# Patient Record
Sex: Female | Born: 1950 | Race: White | Hispanic: No | Smoking: Former smoker
Health system: Southern US, Community
[De-identification: ages and names within clinical notes are randomized; demographics above are authoritative.]

## PROBLEM LIST (undated history)

## (undated) DIAGNOSIS — C3490 Malignant neoplasm of unspecified part of unspecified bronchus or lung: Secondary | ICD-10-CM

## (undated) DIAGNOSIS — M1990 Unspecified osteoarthritis, unspecified site: Secondary | ICD-10-CM

## (undated) DIAGNOSIS — Z947 Corneal transplant status: Secondary | ICD-10-CM

## (undated) DIAGNOSIS — T753XXA Motion sickness, initial encounter: Secondary | ICD-10-CM

## (undated) DIAGNOSIS — E785 Hyperlipidemia, unspecified: Secondary | ICD-10-CM

## (undated) DIAGNOSIS — H269 Unspecified cataract: Secondary | ICD-10-CM

## (undated) DIAGNOSIS — K219 Gastro-esophageal reflux disease without esophagitis: Secondary | ICD-10-CM

## (undated) DIAGNOSIS — G893 Neoplasm related pain (acute) (chronic): Secondary | ICD-10-CM

## (undated) DIAGNOSIS — T8859XA Other complications of anesthesia, initial encounter: Secondary | ICD-10-CM

## (undated) DIAGNOSIS — I10 Essential (primary) hypertension: Secondary | ICD-10-CM

## (undated) HISTORY — DX: Neoplasm related pain (acute) (chronic): G89.3

## (undated) HISTORY — DX: Corneal transplant status: Z94.7

## (undated) HISTORY — DX: Essential (primary) hypertension: I10

## (undated) HISTORY — DX: Hyperlipidemia, unspecified: E78.5

## (undated) HISTORY — PX: SKIN CANCER EXCISION: SHX779

## (undated) HISTORY — PX: BASAL CELL CARCINOMA EXCISION: SHX1214

## (undated) HISTORY — PX: CARPAL TUNNEL RELEASE: SHX101

## (undated) HISTORY — DX: Unspecified cataract: H26.9

---

## 1995-11-08 ENCOUNTER — Encounter: Payer: Self-pay | Admitting: Family Medicine

## 1995-11-08 LAB — CONVERTED CEMR LAB: Pap Smear: NORMAL

## 1996-11-29 ENCOUNTER — Encounter: Payer: Self-pay | Admitting: Family Medicine

## 1996-11-29 LAB — CONVERTED CEMR LAB
Blood Glucose, Fasting: 83 mg/dL
Pap Smear: NORMAL

## 1996-12-07 ENCOUNTER — Encounter: Payer: Self-pay | Admitting: Family Medicine

## 1996-12-07 LAB — CONVERTED CEMR LAB: TSH: 0.79 microintl units/mL

## 1997-12-24 ENCOUNTER — Encounter: Payer: Self-pay | Admitting: Family Medicine

## 1997-12-24 LAB — CONVERTED CEMR LAB: Pap Smear: NORMAL

## 2001-07-21 ENCOUNTER — Encounter: Payer: Self-pay | Admitting: Family Medicine

## 2001-07-21 LAB — CONVERTED CEMR LAB: Blood Glucose, Fasting: 103 mg/dL

## 2002-06-26 ENCOUNTER — Encounter: Payer: Self-pay | Admitting: Family Medicine

## 2002-06-26 LAB — CONVERTED CEMR LAB
Blood Glucose, Fasting: 107 mg/dL
TSH: 1.34 microintl units/mL

## 2003-07-23 ENCOUNTER — Encounter: Payer: Self-pay | Admitting: Family Medicine

## 2003-07-23 LAB — CONVERTED CEMR LAB
Blood Glucose, Fasting: 107 mg/dL
TSH: 1.138 microintl units/mL

## 2003-07-30 ENCOUNTER — Encounter: Payer: Self-pay | Admitting: Family Medicine

## 2003-07-30 LAB — CONVERTED CEMR LAB: Pap Smear: NORMAL

## 2003-12-14 ENCOUNTER — Other Ambulatory Visit: Payer: Self-pay

## 2004-07-02 ENCOUNTER — Encounter: Payer: Self-pay | Admitting: Family Medicine

## 2004-07-02 LAB — CONVERTED CEMR LAB: Blood Glucose, Fasting: 121 mg/dL

## 2004-07-08 ENCOUNTER — Ambulatory Visit: Payer: Self-pay | Admitting: Family Medicine

## 2004-07-20 ENCOUNTER — Ambulatory Visit: Payer: Self-pay | Admitting: Family Medicine

## 2004-07-27 ENCOUNTER — Ambulatory Visit: Payer: Self-pay | Admitting: Family Medicine

## 2004-08-13 ENCOUNTER — Ambulatory Visit: Payer: Self-pay | Admitting: Family Medicine

## 2004-09-11 ENCOUNTER — Ambulatory Visit: Payer: Self-pay | Admitting: Family Medicine

## 2004-11-12 ENCOUNTER — Ambulatory Visit: Payer: Self-pay | Admitting: Family Medicine

## 2004-12-11 ENCOUNTER — Encounter: Payer: Self-pay | Admitting: Family Medicine

## 2004-12-11 LAB — CONVERTED CEMR LAB: Hgb A1c MFr Bld: 5.5 %

## 2004-12-29 ENCOUNTER — Encounter: Payer: Self-pay | Admitting: Family Medicine

## 2004-12-29 LAB — CONVERTED CEMR LAB: Blood Glucose, Fasting: 108 mg/dL

## 2005-04-09 ENCOUNTER — Ambulatory Visit: Payer: Self-pay | Admitting: Family Medicine

## 2005-05-26 ENCOUNTER — Ambulatory Visit: Payer: Self-pay | Admitting: Family Medicine

## 2005-07-11 ENCOUNTER — Encounter: Payer: Self-pay | Admitting: Family Medicine

## 2005-07-11 LAB — CONVERTED CEMR LAB: Microalbumin U total vol: 3.5 mg/L

## 2005-07-15 ENCOUNTER — Encounter: Payer: Self-pay | Admitting: Family Medicine

## 2005-07-15 LAB — CONVERTED CEMR LAB
Blood Glucose, Fasting: 119 mg/dL
Hgb A1c MFr Bld: 5.7 %
TSH: 1.186 microintl units/mL

## 2005-07-22 ENCOUNTER — Ambulatory Visit: Payer: Self-pay | Admitting: Family Medicine

## 2005-08-04 ENCOUNTER — Ambulatory Visit: Payer: Self-pay | Admitting: Family Medicine

## 2005-09-15 ENCOUNTER — Ambulatory Visit: Payer: Self-pay | Admitting: Family Medicine

## 2005-11-04 ENCOUNTER — Ambulatory Visit: Payer: Self-pay | Admitting: Family Medicine

## 2005-11-18 ENCOUNTER — Ambulatory Visit: Payer: Self-pay | Admitting: Family Medicine

## 2006-04-13 ENCOUNTER — Ambulatory Visit: Payer: Self-pay | Admitting: Family Medicine

## 2006-07-28 ENCOUNTER — Encounter: Payer: Self-pay | Admitting: Family Medicine

## 2006-07-28 LAB — CONVERTED CEMR LAB
Blood Glucose, Fasting: 107 mg/dL
Hgb A1c MFr Bld: 5.9 %
TSH: 1.356 microintl units/mL

## 2006-08-03 ENCOUNTER — Ambulatory Visit: Payer: Self-pay | Admitting: Family Medicine

## 2006-08-11 LAB — CONVERTED CEMR LAB
OCCULT 1: NEGATIVE
OCCULT 2: NEGATIVE
OCCULT 3: NEGATIVE

## 2006-08-18 ENCOUNTER — Telehealth (INDEPENDENT_AMBULATORY_CARE_PROVIDER_SITE_OTHER): Payer: Self-pay | Admitting: *Deleted

## 2006-08-22 ENCOUNTER — Ambulatory Visit: Payer: Self-pay | Admitting: Family Medicine

## 2006-12-06 ENCOUNTER — Encounter: Payer: Self-pay | Admitting: Family Medicine

## 2006-12-06 DIAGNOSIS — M159 Polyosteoarthritis, unspecified: Secondary | ICD-10-CM | POA: Insufficient documentation

## 2006-12-06 DIAGNOSIS — G2589 Other specified extrapyramidal and movement disorders: Secondary | ICD-10-CM | POA: Insufficient documentation

## 2006-12-06 DIAGNOSIS — E785 Hyperlipidemia, unspecified: Secondary | ICD-10-CM | POA: Insufficient documentation

## 2006-12-06 DIAGNOSIS — C44300 Unspecified malignant neoplasm of skin of unspecified part of face: Secondary | ICD-10-CM | POA: Insufficient documentation

## 2006-12-06 DIAGNOSIS — K219 Gastro-esophageal reflux disease without esophagitis: Secondary | ICD-10-CM | POA: Insufficient documentation

## 2006-12-06 DIAGNOSIS — J309 Allergic rhinitis, unspecified: Secondary | ICD-10-CM | POA: Insufficient documentation

## 2006-12-06 DIAGNOSIS — C44309 Unspecified malignant neoplasm of skin of other parts of face: Secondary | ICD-10-CM | POA: Insufficient documentation

## 2006-12-08 ENCOUNTER — Ambulatory Visit: Payer: Self-pay | Admitting: Family Medicine

## 2006-12-09 ENCOUNTER — Encounter: Payer: Self-pay | Admitting: Family Medicine

## 2006-12-13 ENCOUNTER — Encounter (INDEPENDENT_AMBULATORY_CARE_PROVIDER_SITE_OTHER): Payer: Self-pay | Admitting: *Deleted

## 2007-01-18 ENCOUNTER — Telehealth (INDEPENDENT_AMBULATORY_CARE_PROVIDER_SITE_OTHER): Payer: Self-pay | Admitting: *Deleted

## 2007-04-10 ENCOUNTER — Telehealth: Payer: Self-pay | Admitting: Family Medicine

## 2007-06-29 ENCOUNTER — Telehealth: Payer: Self-pay | Admitting: Family Medicine

## 2007-06-29 ENCOUNTER — Encounter: Payer: Self-pay | Admitting: Family Medicine

## 2007-11-04 ENCOUNTER — Encounter: Payer: Self-pay | Admitting: Family Medicine

## 2007-11-07 ENCOUNTER — Ambulatory Visit: Payer: Self-pay | Admitting: Family Medicine

## 2007-11-11 ENCOUNTER — Encounter: Payer: Self-pay | Admitting: Family Medicine

## 2007-11-30 ENCOUNTER — Ambulatory Visit: Payer: Self-pay | Admitting: Family Medicine

## 2007-11-30 LAB — CONVERTED CEMR LAB
OCCULT 1: NEGATIVE
OCCULT 2: NEGATIVE
OCCULT 3: NEGATIVE

## 2007-12-01 ENCOUNTER — Encounter (INDEPENDENT_AMBULATORY_CARE_PROVIDER_SITE_OTHER): Payer: Self-pay | Admitting: *Deleted

## 2007-12-20 ENCOUNTER — Encounter: Payer: Self-pay | Admitting: Family Medicine

## 2007-12-20 ENCOUNTER — Ambulatory Visit: Payer: Self-pay | Admitting: Family Medicine

## 2008-01-02 ENCOUNTER — Ambulatory Visit: Payer: Self-pay | Admitting: Family Medicine

## 2008-01-02 ENCOUNTER — Encounter: Payer: Self-pay | Admitting: Family Medicine

## 2008-01-08 ENCOUNTER — Encounter (INDEPENDENT_AMBULATORY_CARE_PROVIDER_SITE_OTHER): Payer: Self-pay | Admitting: *Deleted

## 2008-04-10 ENCOUNTER — Telehealth: Payer: Self-pay | Admitting: Family Medicine

## 2008-04-15 ENCOUNTER — Telehealth: Payer: Self-pay | Admitting: Family Medicine

## 2008-11-01 ENCOUNTER — Telehealth: Payer: Self-pay | Admitting: Family Medicine

## 2008-11-05 ENCOUNTER — Encounter: Payer: Self-pay | Admitting: Family Medicine

## 2008-11-14 ENCOUNTER — Ambulatory Visit: Payer: Self-pay | Admitting: Family Medicine

## 2008-11-14 DIAGNOSIS — E119 Type 2 diabetes mellitus without complications: Secondary | ICD-10-CM | POA: Insufficient documentation

## 2008-11-21 ENCOUNTER — Encounter (INDEPENDENT_AMBULATORY_CARE_PROVIDER_SITE_OTHER): Payer: Self-pay | Admitting: *Deleted

## 2008-12-04 ENCOUNTER — Ambulatory Visit: Payer: Self-pay | Admitting: Family Medicine

## 2008-12-04 ENCOUNTER — Encounter (INDEPENDENT_AMBULATORY_CARE_PROVIDER_SITE_OTHER): Payer: Self-pay | Admitting: *Deleted

## 2008-12-04 LAB — CONVERTED CEMR LAB
OCCULT 1: NEGATIVE
OCCULT 2: NEGATIVE
OCCULT 3: NEGATIVE

## 2008-12-12 ENCOUNTER — Ambulatory Visit: Payer: Self-pay | Admitting: Family Medicine

## 2008-12-12 ENCOUNTER — Telehealth: Payer: Self-pay | Admitting: Family Medicine

## 2009-01-16 LAB — HM PAP SMEAR: HM Pap smear: NORMAL

## 2009-03-12 ENCOUNTER — Telehealth: Payer: Self-pay | Admitting: Family Medicine

## 2009-03-14 ENCOUNTER — Encounter: Payer: Self-pay | Admitting: Family Medicine

## 2009-03-20 ENCOUNTER — Ambulatory Visit: Payer: Self-pay | Admitting: Family Medicine

## 2009-03-20 DIAGNOSIS — M1990 Unspecified osteoarthritis, unspecified site: Secondary | ICD-10-CM | POA: Insufficient documentation

## 2009-05-09 ENCOUNTER — Telehealth: Payer: Self-pay | Admitting: Family Medicine

## 2009-05-16 ENCOUNTER — Encounter: Payer: Self-pay | Admitting: Family Medicine

## 2009-06-03 ENCOUNTER — Ambulatory Visit: Payer: Self-pay | Admitting: Orthopedic Surgery

## 2009-06-10 ENCOUNTER — Telehealth: Payer: Self-pay | Admitting: Family Medicine

## 2009-06-11 ENCOUNTER — Ambulatory Visit: Payer: Self-pay | Admitting: Orthopedic Surgery

## 2009-11-17 ENCOUNTER — Encounter (INDEPENDENT_AMBULATORY_CARE_PROVIDER_SITE_OTHER): Payer: Self-pay | Admitting: *Deleted

## 2010-01-12 LAB — HM MAMMOGRAPHY: HM Mammogram: NORMAL

## 2010-02-03 ENCOUNTER — Ambulatory Visit: Payer: Self-pay | Admitting: Internal Medicine

## 2010-05-12 NOTE — Progress Notes (Signed)
Summary: wants referral to hand specialist  Phone Note Call from Patient Call back at Home Phone [phone]   Caller: Patient Call For: Shaune Leeks MD Summary of Call: Pt requests referral to a hand specialist for her arthritis- wants to see Dr Kennith Center in [place].  Needs an early morning or late afternoon appt if possible. Initial call taken by: Lowella Petties CMA,  May 09, 2009 12:31 PM  Follow-up for Phone Call        Pt called to see if had appt with Dr. Kennith Center yet. I explained phone note in place and someone will call her back with info when have more information. Pt said that was OK. Lewanda Rife LPN  May 12, 2009 4:47 PM   Additional Follow-up for Phone Call Additional follow up Details #1::        Appt made with Dr Kennith Center on 05/15/2009 at 2:30pm patient notified by phone.  Additional Follow-up by: Carlton Adam,  May 13, 2009 4:21 PM

## 2010-05-12 NOTE — Letter (Signed)
Summary: Nadara Eaton letter  [place] at [facility]  [address] [place], [place] [address]   Phone: [phone]  Fax: [phone]       11/17/2009 MRN: [id number]  [facility] [address] [place], [place]  [address]  Dear Ms. [facility] [place], and [place] announce the retirement of Arta Silence, M.D., from full-time practice at the [facility] office effective October 09, 2009 and his plans of returning part-time.  It is important to Dr. Hetty Ely and to our practice that you understand that [facility] has seven physicians in our office for your health care needs.  We will continue to offer the same exceptional care that you have today.    Dr. Hetty Ely has spoken to many of you about his plans for retirement and returning part-time in the fall.   We will continue to work with you through the transition to schedule appointments for you in the office and meet the high standards that [place] is committed to.   Again, it is with great pleasure that we share the news that Dr. Hetty Ely will return to [facility] in October of 2011 with a reduced schedule.    If you have any questions, or would like to request an appointment with one of our physicians, please call us at [phone] and press the option for Scheduling an appointment.  We take pleasure in providing you with excellent patient care and look forward to seeing you at your next office visit.  Our [facility] Physicians are:  Tillman Abide, M.D. Laurita Quint, M.D. Roxy Manns, M.D. Kerby Nora, M.D. Hannah Beat, M.D. Ruthe Mannan, M.D. We proudly welcomed Raechel Ache, M.D. and Eustaquio Boyden, M.D. to the practice in July/August 2011.  Sincerely,  [facility]

## 2010-05-12 NOTE — Assessment & Plan Note (Signed)
Summary: 3 month follow up/rbh R/S FROM 12/7   Vital Signs:  Patient profile:   60 year old female Weight:      138.50 pounds Temp:     97.7 degrees F oral Pulse rate:   84 / minute Pulse rhythm:   regular BP sitting:   130 / 72  (left arm) Cuff size:   regular  Vitals Entered By: Sydell Axon LPN (March 20, 2009 3:54 PM) CC: 3 Month follow-up   History of Present Illness: Pt here for three month followup for new onset DM, first f/u last time with 5 pound weight loss. She hjas since that appt, quit smoking gone through Thanksgiving and still diabetic and has lost another 2 pounds!! Congrats on quitting smoking!! She feels well w/o complaint and is continuing to watch her diet carefully. SHe was slightly noncompliant  over the holiday but in general has done very well. Her nos are typically in the 90-110 range. Her only complaint is her hands tingling and feeling numb at times. She had been predcribed splints in the past and has worn them but not all the time she should. We discussed this again.  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 1 Daily By Mouth 2)  Simvastatin 40 Mg Tabs (Simvastatin) .... One Tab By Mouth At Night 3)  Prilosec 20 Mg  Cpdr (Omeprazole) .Marland Kitchen.. 1 Once Daily Prn 4)  Chantix Starting Month Pak 0.5 Mg X 11 & 1 Mg X 42 Tabs (Varenicline Tartrate) .... As Dir 5)  Chantix 1 Mg Tabs (Varenicline Tartrate) .... As Dir 6)   Onetouch Ultra Test  Strp (Glucose Blood) .... Check Blood Sugar One Time A Day  Allergies: No Known Drug Allergies  Physical Exam  General:  Well-developed,well-nourished,in no acute distress; alert,appropriate and cooperative throughout examination Head:  Normocephalic and atraumatic without obvious abnormalities. No apparent alopecia or balding. Eyes:  Conjunctiva clear bilaterally.  Ears:  External ear exam shows no significant lesions or deformities.  Otoscopic examination reveals clear canals, tympanic membranes are intact bilaterally without bulging, retraction, inflammation or discharge. Hearing is grossly normal bilaterally. Nose:  External nasal examination shows no deformity or inflammation. Nasal mucosa are pink and moist without lesions or exudates. Mouth:  Oral mucosa and oropharynx without lesions or exudates.  Teeth in good repair. Neck:  No deformities, masses, or tenderness noted. Chest Wall:  No deformities, masses, or tenderness noted. Lungs:  Normal respiratory effort, chest expands symmetrically. Lungs are clear to auscultation, no crackles or wheezes. Heart:  Normal rate and regular rhythm. S1 and S2 normal without gallop, murmur, click, rub or other extra sounds.   Impression & Recommendations:  Problem # 1:  DIABETES MELLITUS, WITHOUT COMPLICATIONS (ICD-250.00) Assessment Improved  Doing well. Discussed diet and exercise. Discussed progression of disease. She has good knowledge and knows what and how to eat appropriately. Requests not to come back until PE when I will be gone. Continue practicing practices we have discussed.  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 Let me know if dioesn't help. May try Requip or poss refer to Hand Surg for eval.  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 Discussed Tyl regularly.  Complete Medication List: 1)  Furosemide 40 Mg Tabs (Furosemide) .Marland Kitchen.. 1 daily by mouth 2)  Simvastatin 40 Mg Tabs (Simvastatin) .... One tab by mouth at night 3)  Prilosec 20 Mg Cpdr (Omeprazole) .Marland Kitchen.. 1 once daily prn 4)  Chantix Starting Month Pak 0.5 Mg X 11 & 1 Mg X 42 Tabs (Varenicline tartrate) .... As dir 5)  Chantix 1 Mg Tabs (Varenicline tartrate) .... As dir 6)  Onetouch Ultra Test Strp (Glucose blood) .... Check blood sugar one time a day  Patient Instructions: 1)  For congestion, Take Guaifenesin by [facility] or [facility] and getting MUCOUS RELIEF EXPECTORANT (400mg ), take 11/2 tabs by mouth AM and NOON. 2)  Drink lots of fluids anytime taking Guaifenesin. 3)  RTC as needed.  Current Allergies (reviewed today): No known allergies

## 2010-05-12 NOTE — Progress Notes (Signed)
Summary: asking for transderm scop  Phone Note Call from Patient   Caller: Patient Call For: Shaune Leeks MD Summary of Call: Pt is going on a 5 day cruise and is asking for transderm scop [facility] [place]. Initial call taken by: Lowella Petties,  April 10, 2008 9:45 AM    New/Updated Medications: TRANSDERM-SCOP 1.5 MG PT72 (SCOPOLAMINE BASE) apply on patch behind ear every 3 days   Prescriptions: TRANSDERM-SCOP 1.5 MG PT72 (SCOPOLAMINE BASE) apply on patch behind ear every 3 days  #2 patches x 0   Entered and Authorized by:   Shaune Leeks MD   Signed by:   Shaune Leeks MD on 04/10/2008   Method used:   [id number]       [address] [facility], [place]  [address]       Ph: [phone] or [phone]       Fax: [phone]   RxID:   [id number]

## 2010-05-12 NOTE — Assessment & Plan Note (Signed)
Summary: CPX/DLO   Vital Signs:  Patient Profile:   60 Years Old Female Height:     61. inches (156.21 cm) Weight:      142 pounds Temp:     97.9 degrees F oral Pulse rate:   64 / minute Pulse rhythm:   regular BP sitting:   140 / 60  (left arm) Cuff size:   regular  Vitals Entered By: Providence Crosby (November 07, 2007 8:23 AM)                 Chief Complaint:  check up// husband died in april// skin cancer removed from chin// hemoccult cards to patient.  History of Present Illness: Husband died in bed somewhat unexpectedly in April. Pt here for Comp Exam, doing ok. Pt dealing with death ok, no overt depression. No real complaints today. Periodic Limb Disorder is stable.    Prior Medications Reviewed Using: Patient Recall  Current Allergies: No known allergies   Past Surgical History:    TONSILLECTOMY    NSVD x 2    MISCARRIAGE    SEPTOPLASTY W/ SINUS REPAIR    HOSPITAL PNEUMONIA:(12/06- 03/20/2001    BCC removal  left lower jaw(Dr Jarold Motto) (11/02/07)   Social History:    Marital Status: Married  Widow  HUSBAND dec 4/09    Children: 2 GIRLS BOTH AT HOME; [facility] AND HEATHER    Occupation:[employer]    Risk Factors:  Tobacco use:  current    Year started:  recurrent    Counseled to quit/cut down tobacco use:  yes Passive smoke exposure:  no Drug use:  no HIV high-risk behavior:  no Caffeine use:  2 drinks per day Alcohol use:  no Exercise:  yes    Times per week:  6    Type:  walks  Seatbelt use:  100 %   Review of Systems  Eyes      Complains of blurring.      Denies discharge, double vision, eye irritation, eye pain, halos, itching, light sensitivity, red eye, vision loss-1 eye, and vision loss-both eyes.      to see Opth next week  ENT      Denies decreased hearing, difficulty swallowing, ear discharge, earache, hoarseness, nasal congestion, nosebleeds, postnasal drainage, ringing in ears, sinus pressure, and sore throat.  CV  Denies bluish discoloration of lips or nails, chest pain or discomfort, difficulty breathing at night, difficulty breathing while lying down, fainting, fatigue, leg cramps with exertion, lightheadness, near fainting, palpitations, shortness of breath with exertion, swelling of feet, swelling of hands, and weight gain.  Resp      Denies chest discomfort, chest pain with inspiration, cough, coughing up blood, excessive snoring, hypersomnolence, morning headaches, pleuritic, shortness of breath, sputum productive, and wheezing.  GI      Complains of constipation and indigestion.      Denies abdominal pain, bloody stools, change in bowel habits, dark tarry stools, diarrhea, excessive appetite, gas, hemorrhoids, loss of appetite, nausea, vomiting, vomiting blood, and yellowish skin color.      occas  GU      Denies abnormal vaginal bleeding, decreased libido, discharge, dysuria, genital sores, hematuria, incontinence, nocturia, urinary frequency, and urinary hesitancy.  MS      Complains of joint pain.      Denies joint redness, joint swelling, loss of strength, low back pain, mid back pain, muscle aches, muscle , cramps, muscle weakness, stiffness, and thoracic pain.      various  of upper extremity.  Derm      Denies changes in color of skin, changes in nail beds, dryness, excessive perspiration, flushing, hair loss, insect bite(s), itching, lesion(s), poor wound healing, and rash.      L ankle Rhus Derm  Neuro      Denies brief paralysis, difficulty with concentration, disturbances in coordination, falling down, headaches, inability to speak, memory loss, numbness, poor balance, seizures, sensation of room spinning, tingling, tremors, visual disturbances, and weakness.   Physical Exam  General:     Well-developed,well-nourished,in no acute distress; alert,appropriate and cooperative throughout examination Head:     Normocephalic and atraumatic without obvious abnormalities. No apparent  alopecia or balding. Eyes:     Conjunctiva clear bilaterally.  Ears:     External ear exam shows no significant lesions or deformities.  Otoscopic examination reveals clear canals, tympanic membranes are intact bilaterally without bulging, retraction, inflammation or discharge. Hearing is grossly normal bilaterally. Nose:     External nasal examination shows no deformity or inflammation. Nasal mucosa are pink and moist without lesions or exudates. Mouth:     Oral mucosa and oropharynx without lesions or exudates.  Teeth in good repair. Neck:     No deformities, masses, or tenderness noted. Chest Wall:     No deformities, masses, or tenderness noted. Breasts:     No mass, nodules, thickening, tenderness, bulging, retraction, inflamation, nipple discharge or skin changes noted.   Lungs:     Normal respiratory effort, chest expands symmetrically. Lungs are clear to auscultation, no crackles or wheezes. Heart:     Normal rate and regular rhythm. S1 and S2 normal without gallop, murmur, click, rub or other extra sounds. Abdomen:     Bowel sounds positive,abdomen soft and non-tender without masses, organomegaly or hernias noted. Rectal:     No external abnormalities noted. Normal sphincter tone. No rectal masses or tenderness. G pos....will do stool cards, hasn't had any problems Genitalia:     Pelvic Exam:        External: normal female genitalia without lesions or masses        Vagina: normal without lesions or masses        Cervix: normal without lesions or masses        Adnexa: normal bimanual exam without masses or fullness        Uterus: normal by palpation        Pap smear: performed Msk:     No deformity or scoliosis noted of thoracic or lumbar spine.   Pulses:     R and L carotid,radial,femoral,dorsalis pedis and posterior tibial pulses are full and equal bilaterally Extremities:     No clubbing, cyanosis, edema, or deformity noted with normal full range of motion of all  joints.   Neurologic:     No cranial nerve deficits noted. Station and gait are normal. Plantar reflexes are down-going bilaterally. DTRs are symmetrical throughout. Sensory, motor and coordinative functions appear intact. Skin:     Intact without suspicious lesions or rashes Cervical Nodes:     No lymphadenopathy noted Axillary Nodes:     No palpable lymphadenopathy Inguinal Nodes:     No significant adenopathy Psych:     Cognition and judgment appear intact. Alert and cooperative with normal attention span and concentration. No apparent delusions, illusions, hallucinations    Impression & Recommendations:  Problem # 1:  HEALTH MAINTENANCE EXAM (ICD-V70.0) Assessment: Comment Only  Problem # 2:  HYPERGLYCEMIA (ICD-790.29) Assessment: Unchanged Stable. 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  Problem # 4:  DEGENERATIVE JOINT DISEASE, GENERALIZED (ICD-715.00) Assessment: Unchanged Reasonably stable.  Problem # 5:  GERD (ICD-530.81) Assessment: Unchanged Stabl with current approach. Her updated medication list for this problem includes:    Prilosec 20 Mg Cpdr (Omeprazole) .Marland Kitchen... 1 once daily prn   Problem # 6:  ALLERGIC RHINITIS (ICD-477.9) Stable and well tolerated. The following medications were removed from the medication list:    Nasacort Aq 55 Mcg/act Aers (Triamcinolone acetonide(nasal)) .Marland Kitchen... 1 spray each nostril qd  Her updated medication list for this problem includes:    Astelin 137 Mcg/spray Soln (Azelastine hcl) .Marland Kitchen... 1 spray each nostril once daily prn Discussed use of allergy medications and environmental measures.   Complete Medication List: 1)  Furosemide 40 Mg Tabs (Furosemide) .Marland Kitchen.. 1 qd 2)  Lovastatin 40 Mg Tabs (Lovastatin) .... 2 q hs 3)  Prilosec 20 Mg Cpdr (Omeprazole) .Marland Kitchen.. 1 once daily prn 4)  Astelin 137 Mcg/spray Soln  (Azelastine hcl) .Marland Kitchen.. 1 spray each nostril once daily prn  Other Orders: Radiology Referral (Radiology)   Patient Instructions: 1)  Refer for Mammo. 2)  RTC as needed    ]

## 2010-05-12 NOTE — Progress Notes (Signed)
Summary:  SIMVASTATIN 40 MG TABS  Phone Note Refill Request Call back at Home Phone [phone] Message from:  Patient on June 10, 2009 1:39 PM  Refills Requested: Medication #1:  SIMVASTATIN 40 MG TABS one tab by mouth at night [facility] [id number])  Initial call taken by: Melody Comas,  June 10, 2009 1:40 PM Caller: Patient Call For: Shaune Leeks MD  Follow-up for Phone Call        [facility]  Follow-up by: Melody Comas,  June 10, 2009 2:24 PM    Prescriptions: SIMVASTATIN 40 MG TABS (SIMVASTATIN) one tab by mouth at night  #90 x 4   Entered and Authorized by:   Shaune Leeks MD   Signed by:   Shaune Leeks MD on 06/10/2009   Method used:   Printed then faxed to ...         RxID:   [id number]

## 2010-05-12 NOTE — Letter (Signed)
Summary: Results Follow up Letter  [place] at [facility]  [address] [place], [place] [address]   Phone: [phone]  Fax: [phone]    01/08/2008 MRN: [id number]  [facility] [address] [place], [place]  [address]  Dear Ms. Hutsell,  The following are the results of your recent test(s):  Test         Result    Pap Smear:        Normal _____  Not Normal _____ Comments: ______________________________________________________ Cholesterol: LDL(Bad cholesterol):         Your goal is less than:         HDL (Good cholesterol):       Your goal is more than: Comments:  ______________________________________________________ Mammogram:        Normal ___X__  Not Normal _____ Comments:   YOUR MAMMOGRAM REPORT WAS OKAY. PLEASE MAKE SURE TO REPEAT IN ONE YEAR. ENCLOSED IS A COPY OF YOUR REPORT.  ___________________________________________________________________ Hemoccult:        Normal _____  Not normal _______ Comments:    _____________________________________________________________________ Other Tests:    We routinely do not discuss normal results over the telephone.  If you desire a copy of the results, or you have any questions about this information we can discuss them at your next office visit.   Sincerely,

## 2010-05-12 NOTE — Letter (Signed)
Summary: Results Follow up Letter  [place] at [facility]  [address] [place], [place] [address]   Phone: [phone]  Fax: [phone]    12/04/2008 MRN: [id number]  [facility] [address] [place], [place]  [address]  Dear Ms. Gaber,  The following are the results of your recent test(s):  Test         Result    Pap Smear:        Normal _____  Not Normal _____ Comments: ______________________________________________________ Cholesterol: LDL(Bad cholesterol):         Your goal is less than:         HDL (Good cholesterol):       Your goal is more than: Comments:  ______________________________________________________ Mammogram:        Normal _____  Not Normal _____ Comments:  ___________________________________________________________________ Hemoccult:        Normal _x____  Not normal _______ Comments:  No blood in stool. Thank you for returning the hemoccult cards. Please make sure to repeat in one year.  _____________________________________________________________________ Other Tests:    We routinely do not discuss normal results over the telephone.  If you desire a copy of the results, or you have any questions about this information we can discuss them at your next office visit.   Sincerely,

## 2010-05-12 NOTE — Letter (Signed)
Summary: Dr.E.Hines,[facility],Note  Dr.E.Hines, Orthopaedics,Note   Imported By: Beau Fanny 05/20/2009 10:06:58  _____________________________________________________________________  External Attachment:    Type:   Image     Comment:   External Document

## 2010-05-12 NOTE — Letter (Signed)
Summary: Results Follow up Letter  [place] at [facility]  [address] [place], [place] [address]   Phone: [phone]  Fax: [phone]    12/01/2007 MRN: [id number]  [facility] [address] [place], [place]  [address]  Dear Amber Glenn,  The following are the results of your recent test(s):  Test         Result    Pap Smear:        Normal _____  Not Normal _____ Comments: ______________________________________________________ Cholesterol: LDL(Bad cholesterol):         Your goal is less than:         HDL (Good cholesterol):       Your goal is more than: Comments:  ______________________________________________________ Mammogram:        Normal _____  Not Normal _____ Comments:  ___________________________________________________________________ Hemoccult:        Normal __x___  Not normal _______ Comments:    NO BLOOD IN STOOL. THANK YOU FOR RETURNING THE HEMOCCULT CARDS. PLEASE MAKE SURE TO REPEAT IN ONE YEAR.  _____________________________________________________________________ Other Tests:    We routinely do not discuss normal results over the telephone.  If you desire a copy of the results, or you have any questions about this information we can discuss them at your next office visit.   Sincerely,

## 2010-05-12 NOTE — Letter (Signed)
Summary: Results Follow up Letter  [place] at [facility]  [address] [place], [place] [address]   Phone: [phone]  Fax: [phone]    12/13/2006 MRN: [id number]  [facility] [address] [place], [place]  [address]  Dear Amber Glenn,  The following are the results of your recent test(s):  Test         Result    Pap Smear:        Normal _____  Not Normal _____ Comments: ______________________________________________________ Cholesterol: LDL(Bad cholesterol):         Your goal is less than:         HDL (Good cholesterol):       Your goal is more than: Comments:  ______________________________________________________ Mammogram:        Normal __x___  Not Normal _____ Comments:Your mammogram report was normal. Please make sure to repeat in  one year.  ___________________________________________________________________ Hemoccult:        Normal _____  Not normal _______ Comments:    _____________________________________________________________________ Other Tests:    We routinely do not discuss normal results over the telephone.  If you desire a copy of the results, or you have any questions about this information we can discuss them at your next office visit.   Sincerely,

## 2010-05-12 NOTE — Miscellaneous (Signed)
Summary: one touch ultra test strips  Clinical Lists Changes  Medications: Added new medication of ONETOUCH ULTRA TEST  STRP (GLUCOSE BLOOD) check blood sugar one time a day - Signed Rx of ONETOUCH ULTRA TEST  STRP (GLUCOSE BLOOD) check blood sugar one time a day;  #50 x prn;  Signed;  Entered by: Lowella Petties CMA;  Authorized by: Shaune Leeks MD;  Method used: Electronically to [id number] [address], [place], [place]  [address], Ph: [phone] or [phone], Fax: [phone]    Prescriptions: [place] TEST  STRP (GLUCOSE BLOOD) check blood sugar one time a day  #50 x prn   Entered by:   Lowella Petties CMA   Authorized by:   Shaune Leeks MD   Signed by:   Lowella Petties CMA on 12/12/2008   Method used:   Electronically to        [id number] (retail)       [address]       [place], [place]  [address]       Ph: [phone] or [phone]       Fax: [phone]   RxID:   [id number]   Prior Medications: FUROSEMIDE 40 MG  TABS (FUROSEMIDE) 1 daily by mouth PRILOSEC 20 MG  CPDR (OMEPRAZOLE) 1 once daily PRN CHANTIX STARTING MONTH PAK 0.5 MG X 11 & 1 MG X 42 TABS (VARENICLINE TARTRATE) as dir CHANTIX 1 MG TABS (VARENICLINE TARTRATE) as dir ONETOUCH ULTRA TEST  STRP (GLUCOSE BLOOD) check blood sugar one time a day Current Allergies: No known allergies

## 2010-05-12 NOTE — Progress Notes (Signed)
Summary: dizziness  Phone Note Call from Patient Call back at [facility] Phone [phone] Call back at Work Phone [phone]   Caller: Patient Call For: schaller Reason for Call: Talk to Nurse Summary of Call: pt c/o inner ear  symptoms, dizzy swimmy headed, glands in neck hurting. pt was seen recently do you want to see again? Initial call taken by: Liane Comber,  Aug 18, 2006 9:47 AM  Follow-up for Phone Call        Try antivert 25 q 6hrs as needed (Try to take regularly fior 24 hrs if acutely fairly dizzy, only as needed o/w. Sxs may already be getting better. See me if continues to bother her. Follow-up by: Shaune Leeks MD,  Aug 18, 2006 4:48 PM  Additional Follow-up for Phone Call Additional follow up Details #1::        Phone Call Completed, Rx Called In Additional Follow-up by: Liane Comber,  Aug 19, 2006 9:39 AM

## 2010-05-12 NOTE — Medication Information (Signed)
Summary: LAB ORDERS  LAB ORDERS   Imported By: Beau Fanny 06/29/2007 15:19:29  _____________________________________________________________________  External Attachment:    Type:   Image     Comment:   External Document

## 2010-05-12 NOTE — Assessment & Plan Note (Signed)
Summary: CPX/RBH   Vital Signs:  Patient profile:   60 year old female Height:      61. inches Weight:      145 pounds BMI:     27.50 Temp:     97.9 degrees F oral Pulse rate:   88 / minute Pulse rhythm:   regular BP sitting:   110 / 70  (left arm) Cuff size:   regular  Vitals Entered By: Providence Crosby LPN 30-Nov-2008 8:25 AM) CC: check up// hemoccult cards to patient   History of Present Illness: Pt here for Comp Exam, has had labs. Has no complaints and feels well.  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 1 Daily By Mouth 2)  Lovastatin 40 Mg  Tabs (Lovastatin) .... 2 Q Hs 3)  Prilosec 20 Mg  Cpdr (Omeprazole) .Marland Kitchen.. 1 Once Daily Prn 4)  Astelin 137 Mcg/spray  Soln (Azelastine Hcl) .Marland Kitchen.. 1 Spray Each Nostril Once Daily Prn 5)  Transderm-Scop 1.5 Mg Pt72 (Scopolamine Base) .... Apply On Patch Behind Ear Every 3 Days 6)  Simvastatin 80 Mg Tabs (Simvastatin) .Marland Kitchen.. 1 At Bedtime By Mouth  Allergies (verified): No Known Drug Allergies  Past History:  Past Medical History: Last updated: 12/06/2006 Allergic rhinitis:11/08/1995 GERD:  11/08/1995 Hyperlipidemia: 11/08/1995  Past Surgical History: Last updated: 11/07/2007 TONSILLECTOMY NSVD x 2 MISCARRIAGE SEPTOPLASTY W/ [facility] PNEUMONIA:(12/06- 03/20/2001 BCC removal  left lower jaw(Dr Jarold Motto) (11/02/07)  Family History: Last updated: November 30, 2008 Father: DECEASED 26 YOA ? MI Mother:: DECEASED 33 YOA MI, DM, LEUKEMIA  Siblings:3 SISTERS 1 DECEASED IN CAR WRECK// 2 LIVING   1 SISTER THALASSEMIA AND BREAST CANCER/  1 SISTER THALASSEMIA  CV: + FATHER, + MOTHER HBP: POSITIVE DM: + MOTHER GOUT/ARTHRTIS: + SELF PROSTATE CANCER: BREAST CANCER: + SISTER COLON CANCER: DEPRESSION: NEGATIVE ETOH/DRUG ABUSE: NEGATIVE OTHER: NEGATIVE STROKE  Social History: Last updated: 30-Nov-2008 Marital Status: Married  Widow  HUSBAND dec 4/09 LIVES ALONE Children:  WENDY AND HEATHER Occupation:[employer]   Risk Factors: Alcohol Use: 0 (November 30, 2008) Caffeine Use: 1 (11/30/08) Exercise: yes (11-30-08)  Risk Factors: Smoking Status: current (2008-11-30) Packs/Day: 1/2 (November 30, 2008) Passive Smoke Exposure: no (Nov 30, 2008)  Family History: Father: DECEASED 63 YOA ? MI Mother:: DECEASED 89 YOA MI, DM, LEUKEMIA  Siblings:3 SISTERS 1 DECEASED IN CAR WRECK// 2 LIVING   1 SISTER THALASSEMIA AND BREAST CANCER/  1 SISTER THALASSEMIA  CV: + FATHER, + MOTHER HBP: POSITIVE DM: + MOTHER GOUT/ARTHRTIS: + SELF PROSTATE CANCER: BREAST CANCER: + SISTER COLON CANCER: DEPRESSION: NEGATIVE ETOH/DRUG  ABUSE: NEGATIVE OTHER: NEGATIVE STROKE  Social History: Marital Status: Married  Widow  HUSBAND dec 4/09 LIVES ALONE Children:  WENDY AND HEATHER Occupation:[employer]  Caffeine use/day:  1  Review of Systems General:  Denies chills, fatigue, fever, loss of appetite, malaise, sleep disorder, sweats, weakness, and weight loss. Eyes:  recent eye exam, stable. ENT:  Denies decreased hearing, difficulty swallowing, ear discharge, earache,  hoarseness, nasal congestion, nosebleeds, postnasal drainage, ringing in ears, sinus pressure, and sore throat; occas fluid. CV:  Denies bluish discoloration of lips or nails, chest pain or discomfort, difficulty breathing at night, difficulty breathing while lying down, fainting, fatigue, leg cramps with exertion, lightheadness, near fainting, palpitations, shortness of breath with exertion, swelling of feet, swelling of hands, and weight gain. Resp:  Denies chest discomfort, chest pain with inspiration, cough, coughing up blood, excessive snoring, hypersomnolence, morning headaches, pleuritic, shortness of breath, sputum productive, and wheezing. GI:  Complains of abdominal pain and hemorrhoids; denies bloody stools, change in bowel habits, constipation, dark tarry stools, diarrhea, excessive appetite, gas, indigestion, loss of appetite, nausea, vomiting, vomiting blood, and yellowish skin color; occas mild discomfort. GU:  Denies abnormal vaginal bleeding, decreased libido, discharge, dysuria, genital sores, hematuria, incontinence, nocturia, urinary frequency, and urinary hesitancy. MS:  Complains of joint pain; denies joint redness, joint swelling, loss of strength, low back pain, mid back pain, muscle aches, muscle , cramps, muscle weakness, stiffness, and thoracic pain; typical hands, stable.   . Derm:  Denies changes in color of skin, changes in nail beds, dryness, excessive perspiration, flushing, hair loss, insect bite(s), itching, lesion(s), poor wound healing, and rash. Neuro:  Denies brief paralysis, difficulty with concentration, disturbances in coordination, falling down, headaches, inability to speak, memory loss, numbness, poor balance, seizures, sensation of room spinning, tingling, tremors, visual disturbances, and weakness.  Physical Exam  General:  Well-developed,well-nourished,in no acute distress; alert,appropriate and cooperative throughout examination Head:  Normocephalic and  atraumatic without obvious abnormalities. No apparent alopecia or balding. Eyes:  Conjunctiva clear bilaterally.  Ears:  External ear exam shows no significant lesions or deformities.  Otoscopic examination reveals clear canals, tympanic membranes are intact bilaterally without bulging, retraction, inflammation or discharge. Hearing is grossly normal bilaterally. Nose:  External nasal examination shows no deformity or inflammation. Nasal mucosa are pink and moist without lesions or exudates. Mouth:  Oral mucosa and oropharynx without lesions or exudates.  Teeth in good repair. Neck:  No deformities, masses, or tenderness noted. Chest Wall:  No deformities, masses, or tenderness noted. Breasts:  No mass, nodules, thickening, tenderness, bulging, retraction, inflamation, nipple discharge or skin changes noted.   Lungs:  Normal respiratory effort, chest expands symmetrically. Lungs are clear to auscultation, no crackles or wheezes. Heart:  Normal rate and regular rhythm. S1 and S2 normal without gallop, murmur, click, rub or other extra sounds. Abdomen:  Bowel sounds positive,abdomen soft and non-tender without masses, organomegaly or hernias noted. Rectal:  No external abnormalities noted. Normal sphincter tone. No rectal masses or tenderness. G neg. Genitalia:  Pelvic Exam:        External: normal female genitalia without lesions or masses        Vagina: normal without lesions or masses        Cervix: normal without lesions or masses        Adnexa: normal bimanual exam without masses or fullness        Uterus: normal by palpation        Pap smear: performed  Msk:  No deformity or scoliosis noted of thoracic or lumbar spine.   Pulses:  R and L carotid,radial,femoral,dorsalis pedis and posterior tibial pulses are full and equal bilaterally Extremities:  No clubbing, cyanosis, edema, or deformity noted with normal full range of motion of all joints.   Neurologic:  No cranial nerve deficits noted.  Station and gait are normal. Plantar reflexes are down-going bilaterally. DTRs are symmetrical throughout. Sensory, motor and coordinative functions appear intact. Skin:  Intact without suspicious lesions or rashes Cervical Nodes:  No lymphadenopathy noted Axillary Nodes:  No palpable lymphadenopathy Inguinal Nodes:  No significant adenopathy Psych:  Cognition and judgment appear intact. Alert and cooperative with normal attention span and concentration. No apparent delusions, illusions, hallucinations   Impression & Recommendations:  Problem # 1:  HEALTH MAINTENANCE EXAM (ICD-V70.0) Assessment Comment Only  Problem # 2:  DIABETES MELLITUS, WITHOUT COMPLICATIONS (ICD-250.00) Assessment: New  Discussed diet and exercise....start checking once in AM fasting. Willbring back in one month and discusss Diabetes in detail.  In meantime, start monitoring once a day in  the AM fasting.  Labs Reviewed: Microalbumin: 3.5 (07/11/2005) Reviewed HgBA1c results: 5.9 (07/28/2006)  5.7 (07/15/2005)  Problem # 3:  DEGENERATIVE JOINT DISEASE, GENERALIZED (ICD-715.00) Assessment: Unchanged Stable.  Problem # 4:  HYPERLIPIDEMIA (ICD-272.4) Assessment: Unchanged Adequate. Cont curr medicine. May eventually need to swith due to medications...would go to Pravastatin. The following medications were removed from the medication list:    Simvastatin 80 Mg Tabs (Simvastatin) .Marland Kitchen... 1 at bedtime by mouth Her updated medication list for this problem includes:    Lovastatin 40 Mg Tabs (Lovastatin) .Marland Kitchen... 2  at bedtime by mouth  Problem # 5:  GERD (ICD-530.81) Assessment: Unchanged Stable. Her updated medication list for this problem includes:    Prilosec 20 Mg Cpdr (Omeprazole) .Marland Kitchen... 1 once daily prn  Problem # 6:  ALLERGIC RHINITIS (ICD-477.9) Assessment: Unchanged  Cont Nasal spray as needed. Given samps of Astepro. The following medications were removed from the medication list:    Astelin 137  Mcg/spray Soln (Azelastine hcl) .Marland Kitchen... 1 spray each nostril once daily prn  Discussed use of allergy medications and environmental measures.   Complete Medication List: 1)  Furosemide 40 Mg Tabs (Furosemide) .Marland Kitchen.. 1 daily by mouth 2)  Lovastatin 40 Mg Tabs (Lovastatin) .... 2  at bedtime by mouth 3)  Prilosec 20 Mg Cpdr (Omeprazole) .Marland Kitchen.. 1 once daily prn 4)  Chantix Starting Month Pak 0.5 Mg X 11 & 1 Mg X 42 Tabs (Varenicline tartrate) .... As dir 5)  Chantix 1 Mg Tabs (Varenicline tartrate) .... As dir  Patient Instructions: 1)  RTC one month with glucose diary. Discuss diabetes. 2)  Order Mammo next time. Prescriptions: LOVASTATIN 40 MG  TABS (LOVASTATIN) 2  at bedtime by mouth  #90 x 3   Entered and Authorized by:   Shaune Leeks MD   Signed by:   Shaune Leeks MD on 11/14/2008   Method used:   Print then Give to Patient   RxID:   [id number] FUROSEMIDE 40 MG  TABS (FUROSEMIDE) 1 daily by mouth  #90 x 3   Entered and Authorized by:   Shaune Leeks MD   Signed by:   Shaune Leeks MD on 11/14/2008   Method used:   Print then Give to Patient   RxID:   [id number] CHANTIX 1 MG TABS (VARENICLINE TARTRATE) as dir  #1 x 4   Entered and Authorized by:   Molly Maduro  Liana Crocker MD   Signed by:   Shaune Leeks MD on 11/14/2008   Method used:   Print then Give to Patient   RxID:   [id number] CHANTIX STARTING MONTH PAK 0.5 MG X 11 & 1 MG X 42 TABS (VARENICLINE TARTRATE) as dir  #1 x 0   Entered and Authorized by:   Shaune Leeks MD   Signed by:   Shaune Leeks MD on 11/14/2008   Method used:   Print then Give to Patient   RxID:   [id number]

## 2010-05-12 NOTE — Progress Notes (Signed)
Summary: mail copy of labs  Phone Note Call from Patient Call back at Work Phone [phone]   Caller: Patient Call For: schaller Summary of Call: pt wants a copy of lab results from cpx appt  Initial call taken by: Liane Comber,  January 18, 2007 1:50 PM  Follow-up for Phone Call        results mailed ..................................................................Marland KitchenLiane Comber  January 18, 2007 4:05 PM

## 2010-05-12 NOTE — Assessment & Plan Note (Signed)
Summary: 1 MONTH FOLLOW UP W/GLUCOSE DIARY/RBH   Vital Signs:  Patient profile:   60 year old female Height:      61 inches Weight:      140 pounds BMI:     26.55 Temp:     98.2 degrees F oral Pulse rate:   80 / minute Pulse rhythm:   regular BP sitting:   120 / 60  (left arm)  Vitals Entered By: Providence Crosby LPN (December 12, 2008 8:16 AM) CC: 1 month followup and ?? simvastatin and lovastatin are they the same   History of Present Illness: Pt here for followup of new onset diabetes. She has been doing pretty well. She has lost 5 pounds. Her general no.mhas been 100s. Ferels well. Has been very careful with intake, has already learned some things that make her sugar go up.  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 1 Daily By Mouth 2)  Lovastatin 40 Mg  Tabs (Lovastatin) .... 2  At Bedtime By Mouth 3)  Prilosec 20 Mg  Cpdr (Omeprazole) .Marland Kitchen.. 1 Once Daily Prn 4)  Chantix Starting Month Pak 0.5 Mg X 11 & 1 Mg X 42 Tabs (Varenicline Tartrate) .... As Dir 5)  Chantix 1 Mg Tabs (Varenicline Tartrate) .... As Dir  Allergies (verified): No Known Drug Allergies  Physical Exam  General:  Well-developed,well-nourished,in no acute distress; alert,appropriate and cooperative throughout examination Head:  Normocephalic and atraumatic without obvious abnormalities. No apparent alopecia or balding. Eyes:  Conjunctiva clear bilaterally.  Ears:   External ear exam shows no significant lesions or deformities.  Otoscopic examination reveals clear canals, tympanic membranes are intact bilaterally without bulging, retraction, inflammation or discharge. Hearing is grossly normal bilaterally. Nose:  External nasal examination shows no deformity or inflammation. Nasal mucosa are pink and moist without lesions or exudates. Mouth:  Oral mucosa and oropharynx without lesions or exudates.  Teeth in good repair. Neck:  No deformities, masses, or tenderness noted. Chest Wall:  No deformities, masses, or tenderness noted. Lungs:  Normal respiratory effort, chest expands symmetrically. Lungs are clear to auscultation, no crackles or wheezes. Heart:  Normal rate and regular rhythm. S1 and S2 normal without gallop, murmur, click, rub or other extra sounds.   Impression & Recommendations:  Problem # 1:  DIABETES MELLITUS, WITHOUT COMPLICATIONS (ICD-250.00) Assessment Improved  Start four day progression of monitoring. RTC 3 mos. Continue to be careful. Cont no meds. Insurance will help pasy for strips, let me know which machine you have.  Labs Reviewed: Microalbumin: 3.5 (07/11/2005) Reviewed HgBA1c results: 5.9 (07/28/2006)  5.7 (07/15/2005)  Problem # 2:  HYPERLIPIDEMIA (ICD-272.4) Assessment: Improved Stay on Simvastatin, will try 40 mg. Her updated medication list for this problem includes:    Simvastatin 40 Mg Tabs (Simvastatin) ..... One tab by mouth at night  Complete Medication List: 1)  Furosemide 40 Mg Tabs (Furosemide) .Marland Kitchen.. 1 daily by mouth 2)  Simvastatin 40 Mg Tabs (Simvastatin) .... One tab by mouth  at night 3)  Prilosec 20 Mg Cpdr (Omeprazole) .Marland Kitchen.. 1 once daily prn 4)  Chantix Starting Month Pak 0.5 Mg X 11 & 1 Mg X 42 Tabs (Varenicline tartrate) .... As dir 5)  Chantix 1 Mg Tabs (Varenicline tartrate) .... As dir  Patient Instructions: 1)  RTC 3mos, A1C prior 250.00 and Chol pfofile with SGOT and SGPT 272.4. 2)  ?Add ACEI?  Prescriptions: SIMVASTATIN 40 MG TABS (SIMVASTATIN) one tab by mouth at night  #90 x 4   Entered and Authorized by:   Shaune Leeks MD   Signed by:   Shaune Leeks MD on 12/12/2008   Method used:   Print then Give to Patient   RxID:   [id number]

## 2011-01-14 ENCOUNTER — Encounter: Payer: Self-pay | Admitting: Internal Medicine

## 2011-01-14 ENCOUNTER — Ambulatory Visit (INDEPENDENT_AMBULATORY_CARE_PROVIDER_SITE_OTHER): Payer: 59 | Admitting: Internal Medicine

## 2011-01-14 DIAGNOSIS — Z0000 Encounter for general adult medical examination without abnormal findings: Secondary | ICD-10-CM

## 2011-01-14 DIAGNOSIS — R609 Edema, unspecified: Secondary | ICD-10-CM

## 2011-01-14 DIAGNOSIS — E785 Hyperlipidemia, unspecified: Secondary | ICD-10-CM

## 2011-01-14 MED ORDER — SIMVASTATIN 40 MG PO TABS: 40.0000 mg | ORAL_TABLET | Freq: Every day | ORAL | Status: DC

## 2011-01-14 MED ORDER — FUROSEMIDE 40 MG PO TABS: 40.0000 mg | ORAL_TABLET | Freq: Every day | ORAL | Status: DC

## 2011-01-14 NOTE — Progress Notes (Signed)
Subjective:    Patient ID: Amber Glenn, female    DOB: [birth year], 60 y.o.   MRN: [id number]  HPI 59YO female presents for annual exam. No complaints today. Having some issues finding health insurance for after she retires. Normal appetite and activity level.  Outpatient Encounter Prescriptions as of 01/14/2011  Medication Sig Dispense Refill  . Ascorbic Acid (VITAMIN C) 1000 MG tablet Take 1,000 mg by mouth daily.        Marland Kitchen aspirin 81 MG tablet Take 81 mg by mouth daily.        . furosemide (LASIX) 40 MG tablet Take 1 tablet (40 mg total) by mouth daily.  90 tablet  4  . Multiple Vitamin (MULTIVITAMIN) capsule Take 1 capsule by mouth daily.        . Omeprazole Magnesium (PRILOSEC OTC PO) Take 1 tablet by mouth daily.        . simvastatin (ZOCOR) 40 MG tablet Take 1 tablet (40 mg total) by mouth at bedtime.  90 tablet  4    Review of Systems  Constitutional: Negative for fever, chills, appetite change, fatigue and unexpected weight change.  HENT: Negative for ear pain, congestion, sore throat, trouble swallowing, neck pain, voice change and sinus pressure.   Eyes: Negative for visual disturbance.  Respiratory: Negative for cough, shortness of breath, wheezing and stridor.   Cardiovascular: Negative for chest pain, palpitations and leg swelling.  Gastrointestinal: Negative for nausea, vomiting, abdominal pain, diarrhea, constipation, blood in stool, abdominal distention and anal bleeding.  Genitourinary: Negative for dysuria and flank pain.  Musculoskeletal: Negative for myalgias, arthralgias and gait problem.  Skin: Negative for color change and rash.  Neurological: Negative for dizziness and headaches.  Hematological: Negative for adenopathy. Does not bruise/bleed easily.  Psychiatric/Behavioral: Negative for suicidal ideas, sleep disturbance and dysphoric mood. The patient is not nervous/anxious.    BP 153/83  Pulse 88  Temp(Src) 98.2 F (36.8 C) (Oral)  Resp 16  Ht 5\' 2"   (1.575 m)  Wt 160 lb (72.576 kg)  BMI 29.26 kg/m2  SpO2 96%     Objective:   Physical Exam  Constitutional: She is oriented to person, place, and time. She appears well-developed and well-nourished. No distress.  HENT:  Head: Normocephalic and atraumatic.  Right Ear: External ear normal.  Left Ear: External ear normal.  Nose: Nose normal.  Mouth/Throat: Oropharynx is clear and moist. No oropharyngeal exudate.  Eyes: Conjunctivae are normal. Pupils are equal, round, and reactive to light. Right eye exhibits no discharge. Left eye exhibits no discharge. No scleral icterus.  Neck: Normal range of motion. Neck supple. No tracheal deviation present. No thyromegaly present.  Cardiovascular: Normal rate, regular rhythm, normal heart sounds and intact distal pulses.  Exam reveals no gallop and no friction rub.   No murmur heard. Pulmonary/Chest: Effort normal and breath sounds normal. No respiratory distress. She has no decreased breath sounds. She has no wheezes. She has no rhonchi. She has no rales. She exhibits no tenderness. Right breast exhibits no inverted nipple, no mass, no nipple discharge, no skin change and no tenderness. Left breast exhibits no inverted nipple, no mass, no nipple discharge, no skin change and no tenderness. Breasts are symmetrical.  Abdominal: Soft. Bowel sounds are normal. She exhibits no distension and no mass. There is no tenderness. There is no rebound and no guarding.  Musculoskeletal: Normal range of motion. She exhibits no edema and no tenderness.  Lymphadenopathy:    She has no cervical  adenopathy.  Neurological: She is alert and oriented to person, place, and time. No cranial nerve deficit. She exhibits normal muscle tone. Coordination normal.  Skin: Skin is warm and dry. No rash noted. She is not diaphoretic. No erythema. No pallor.  Psychiatric: She has a normal mood and affect. Her behavior is normal. Judgment and thought content normal.            Assessment & Plan:  1. General exam - Exam normal today. Will schedule mammogram. PAP due next year. Will check CBC, CMP, lipids with labs. Pt will have Flu vaccine at work. Follow up in 6 months.  2. Elevated BP without h/o HTN - Pt will check BP at work and call with readings if BP>140/90. She would prefer not to schedule a 1 month follow up appointment because insurance is ending.  3. Hyperlipidemia - Will check lipids and LFTs with labs.

## 2011-01-14 NOTE — Patient Instructions (Signed)
Call if blood pressure >140/90. Return in 6 months.

## 2011-01-18 LAB — HM DIABETES EYE EXAM: HM Diabetic Eye Exam: NORMAL

## 2011-01-27 ENCOUNTER — Telehealth: Payer: Self-pay | Admitting: Internal Medicine

## 2011-01-27 NOTE — Telephone Encounter (Signed)
Message copied by Virgina Evener on Wed Jan 27, 2011 11:45 AM ------      Message from: Ronna Polio A      Created: Wed Jan 20, 2011  7:55 AM      Regarding: labs       Labs show slight elevation of liver function tests. I would prefer to repeat these in 1 month.

## 2011-01-27 NOTE — Telephone Encounter (Signed)
Left VM for patient to return my call

## 2011-01-27 NOTE — Telephone Encounter (Signed)
Message copied by Virgina Evener on Wed Jan 27, 2011 11:46 AM ------      Message from: Ronna Polio A      Created: Thu Jan 21, 2011  7:54 AM      Regarding: labs       Labs look fine except that liver function test slightly high. Would recommend repeat LFTs in 1 month.

## 2011-01-27 NOTE — Telephone Encounter (Signed)
Message copied by Virgina Evener on Wed Jan 27, 2011 11:43 AM ------      Message from: Ronna Polio A      Created: Wed Jan 20, 2011  7:55 AM      Regarding: labs       Labs show slight elevation of liver function tests. I would prefer to repeat these in 1 month.

## 2011-01-28 ENCOUNTER — Telehealth: Payer: Self-pay | Admitting: Internal Medicine

## 2011-01-28 NOTE — Telephone Encounter (Signed)
Pt would like you to call her. [phone])  [phone] (cell)  Amber Glenn had questions about report from insurance company and she also wanted to know if you have samples of bp meds her head is still hurting.

## 2011-01-28 NOTE — Telephone Encounter (Signed)
If head is still hurting then she should be seen.  The report from her insurance company just sited some minor abnormal labs from the past.  If they refuse to cover her for this reason, I don't see a way to challenge it.

## 2011-01-29 ENCOUNTER — Telehealth: Payer: Self-pay | Admitting: Internal Medicine

## 2011-01-29 NOTE — Telephone Encounter (Signed)
We can try Bystolic 5mg  daily. We can give 1 month of samples. She should follow up in 1 month.

## 2011-01-29 NOTE — Telephone Encounter (Signed)
Patient called and stated that her Blood pressure is running above 140 and it will drop then it goes back down.  She stated that you had told her if her blood pressure goes above 140 that you will give her samples of Hypertension pill.  I assume that you meant Bystolic.  Please advise.

## 2011-02-01 ENCOUNTER — Other Ambulatory Visit: Payer: Self-pay | Admitting: Internal Medicine

## 2011-02-01 ENCOUNTER — Encounter: Payer: Self-pay | Admitting: Internal Medicine

## 2011-02-02 NOTE — Telephone Encounter (Signed)
Patient notified. She has picked up samples.

## 2011-02-18 ENCOUNTER — Telehealth: Payer: Self-pay | Admitting: *Deleted

## 2011-02-18 DIAGNOSIS — R739 Hyperglycemia, unspecified: Secondary | ICD-10-CM

## 2011-02-18 NOTE — Telephone Encounter (Signed)
Pt dropped off letter 01/27/11. She needs documentation that she does not have diabetes. Last fasting CBG was >125 which per PCP is consistent with diabetes. Pt needs to come in for A1c per note on request from patient for letter from MD. Does she need any other labs?  Last CBG was 148, unsure if she was fasting.

## 2011-02-18 NOTE — Telephone Encounter (Signed)
Pt dropped off letter 01/27/11. She needs documentation that she does not have diabetes. Last fasting CBG was >125 which per PCP is consistent with diabetes. Pt needs to come in for A1c per MD on note for request from patient for letter from MD. Does she need any other labs? Last CBG was 148, unsure if she was fasting.

## 2011-02-19 NOTE — Telephone Encounter (Signed)
Patient informed. 

## 2011-02-19 NOTE — Telephone Encounter (Signed)
Labs entered.

## 2011-02-23 ENCOUNTER — Other Ambulatory Visit (INDEPENDENT_AMBULATORY_CARE_PROVIDER_SITE_OTHER): Payer: Self-pay | Admitting: *Deleted

## 2011-02-23 DIAGNOSIS — R739 Hyperglycemia, unspecified: Secondary | ICD-10-CM

## 2011-02-23 DIAGNOSIS — R7309 Other abnormal glucose: Secondary | ICD-10-CM

## 2011-02-23 LAB — COMPREHENSIVE METABOLIC PANEL
ALT: 48 U/L — ABNORMAL HIGH (ref 0–35)
AST: 41 U/L — ABNORMAL HIGH (ref 0–37)
Albumin: 4 g/dL (ref 3.5–5.2)
Alkaline Phosphatase: 67 U/L (ref 39–117)
BUN: 15 mg/dL (ref 6–23)
CO2: 29 mEq/L (ref 19–32)
Calcium: 9 mg/dL (ref 8.4–10.5)
Chloride: 103 mEq/L (ref 96–112)
Creatinine, Ser: 0.8 mg/dL (ref 0.4–1.2)
GFR: 83.21 mL/min (ref >60.00)
Glucose, Bld: 132 mg/dL — ABNORMAL HIGH (ref 70–99)
Potassium: 3.7 mEq/L (ref 3.5–5.1)
Sodium: 142 mEq/L (ref 135–145)
Total Bilirubin: 0.7 mg/dL (ref 0.3–1.2)
Total Protein: 7.2 g/dL (ref 6.0–8.3)

## 2011-02-23 LAB — HEMOGLOBIN A1C: Hgb A1c MFr Bld: 6.5 % (ref 4.6–6.5)

## 2011-03-01 ENCOUNTER — Ambulatory Visit (INDEPENDENT_AMBULATORY_CARE_PROVIDER_SITE_OTHER): Payer: Self-pay | Admitting: Internal Medicine

## 2011-03-01 ENCOUNTER — Encounter: Payer: Self-pay | Admitting: Internal Medicine

## 2011-03-01 ENCOUNTER — Other Ambulatory Visit: Payer: 59

## 2011-03-01 DIAGNOSIS — E785 Hyperlipidemia, unspecified: Secondary | ICD-10-CM

## 2011-03-01 DIAGNOSIS — E119 Type 2 diabetes mellitus without complications: Secondary | ICD-10-CM

## 2011-03-01 DIAGNOSIS — M79652 Pain in left thigh: Secondary | ICD-10-CM

## 2011-03-01 DIAGNOSIS — M79609 Pain in unspecified limb: Secondary | ICD-10-CM

## 2011-03-01 DIAGNOSIS — I10 Essential (primary) hypertension: Secondary | ICD-10-CM

## 2011-03-01 MED ORDER — CARVEDILOL 6.25 MG PO TABS: 6.2500 mg | ORAL_TABLET | Freq: Two times a day (BID) | ORAL | Status: DC

## 2011-03-01 NOTE — Progress Notes (Signed)
Subjective:    Patient ID: Amber Glenn, female    DOB: Jan 12, 1951, 60 y.o.   MRN: [id number]  HPI 60 year old female with a history of hypertension and diabetes presents for followup. She recently had lab work done which showed hemoglobin A1c of 6.5% and fasting sugar of 137. She was concerned because in the past her diabetes had been controlled by diet and exercise, which she felt was consistent with reversal of her diabetes. She was recently denied insurance coverage because of the presence of diabetes. She has never taken medication for diabetes. She reports that her mother was also diagnosed with diabetes but was able to control her blood sugar with diet. She is interested in trying diet and exercise again to help lower her blood sugars. She had checked her blood sugars in the past, however had not been recently checking her sugars on a regular basis.  In regards to her hypertension, she reports full compliance with her Bystolic. She reports that she is out of this medication. She did not bring a record of her blood pressure today.  She also reports a several day history of left lateral upper thigh pain. This is described as a burning pain in her left lateral thigh which occurs intermittently. It is not worsened with exertion. She has not had any weakness in her left leg. She does have a history of chronic low back pain and has had sciatica in the past. She has been taking ibuprofen for pain with moderate improvement.  Outpatient Encounter Prescriptions as of 03/01/2011  Medication Sig Dispense Refill  . Ascorbic Acid (VITAMIN C) 1000 MG tablet Take 1,000 mg by mouth daily.        Marland Kitchen aspirin 81 MG tablet Take 81 mg by mouth daily.        . fish oil-omega-3 fatty acids 1000 MG capsule Take 2 g by mouth daily.        . furosemide (LASIX) 40 MG tablet Take 1 tablet (40 mg total) by mouth daily.  90 tablet  4  . Multiple Vitamin (MULTIVITAMIN) capsule Take 1 capsule by mouth daily.        .  Omeprazole Magnesium (PRILOSEC OTC PO) Take 1 tablet by mouth daily.        . simvastatin (ZOCOR) 40 MG tablet Take 1 tablet (40 mg total) by mouth at bedtime.  90 tablet  4  . DISCONTD: nebivolol (BYSTOLIC) 5 MG tablet Take 5 mg by mouth daily.        . carvedilol (COREG) 6.25 MG tablet Take 1 tablet (6.25 mg total) by mouth 2 (two) times daily.  60 tablet  11    Review of Systems  Constitutional: Negative for fever, chills, appetite change, fatigue and unexpected weight change.  HENT: Positive for neck pain. Negative for ear pain, congestion, sore throat, trouble swallowing and sinus pressure.   Eyes: Negative for visual disturbance.  Respiratory: Negative for cough, shortness of breath, wheezing and stridor.   Cardiovascular: Negative for chest pain, palpitations and leg swelling.  Gastrointestinal: Negative for nausea, vomiting, abdominal pain, diarrhea, constipation and abdominal distention.  Genitourinary: Negative for dysuria and flank pain.  Musculoskeletal: Positive for myalgias (left lateral upper thigh). Negative for arthralgias and gait problem.  Skin: Negative for color change and rash.  Neurological: Negative for dizziness and headaches.  Hematological: Negative for adenopathy. Does not bruise/bleed easily.  Psychiatric/Behavioral: Negative for suicidal ideas, sleep disturbance and dysphoric mood. The patient is not nervous/anxious.  BP 150/82  Pulse 71  Temp(Src) 97.8 F (36.6 C) (Oral)  Resp 12  Ht 5\' 1"  (1.549 m)  Wt 158 lb (71.668 kg)  BMI 29.85 kg/m2  SpO2 98%     Objective:   Physical Exam  Constitutional: She is oriented to person, place, and time. She appears well-developed and well-nourished. No distress.  HENT:  Head: Normocephalic and atraumatic.  Right Ear: External ear normal.  Left Ear: External ear normal.  Nose: Nose normal.  Mouth/Throat: Oropharynx is clear and moist. No oropharyngeal exudate.  Eyes: Conjunctivae are normal. Pupils are equal,  round, and reactive to light. Right eye exhibits no discharge. Left eye exhibits no discharge. No scleral icterus.  Neck: Normal range of motion. Neck supple. No tracheal deviation present. No thyromegaly present.  Cardiovascular: Normal rate, regular rhythm, normal heart sounds and intact distal pulses.  Exam reveals no gallop and no friction rub.   No murmur heard. Pulmonary/Chest: Effort normal and breath sounds normal. No respiratory distress. She has no wheezes. She has no rales. She exhibits no tenderness.  Musculoskeletal: Normal range of motion. She exhibits no edema and no tenderness.  Lymphadenopathy:    She has no cervical adenopathy.  Neurological: She is alert and oriented to person, place, and time. No cranial nerve deficit. She exhibits normal muscle tone. Coordination normal.  Skin: Skin is warm and dry. No rash noted. She is not diaphoretic. No erythema. No pallor.  Psychiatric: She has a normal mood and affect. Her behavior is normal. Judgment and thought content normal.          Assessment & Plan:  1. Diabetes mellitus - We discussed pt recent A1c and fasting sugar, which are consistent with DM.  We also reviewed notes from previous physician, where pt was treated for DM.  Pt will try more aggressive effort at limiting intake of carbohydrates and increasing physical activity. Will plan to repeat A1c and fasting BG in 3 months. We discussed adding metformin, but she would like to hold off at this point.  2. Left upper thigh pain - Question sciatica. Pt would prefer to treat conservatively for now, with ibuprofen as needed. She will call or return to clinic if symptoms not improving.

## 2011-04-29 ENCOUNTER — Telehealth: Payer: Self-pay | Admitting: *Deleted

## 2011-04-29 NOTE — Telephone Encounter (Signed)
Patient requesting a call regarding RFs

## 2011-04-30 NOTE — Telephone Encounter (Signed)
Spoke w/pt  - new insurance needs form filled out, I have not seen this, she will have them refax to my attention

## 2011-05-03 ENCOUNTER — Telehealth: Payer: Self-pay | Admitting: Internal Medicine

## 2011-05-03 NOTE — Telephone Encounter (Signed)
Pt called left message about rx that was to be mail to pharmacy and it was faxed

## 2011-05-05 ENCOUNTER — Other Ambulatory Visit: Payer: Self-pay | Admitting: *Deleted

## 2011-05-05 DIAGNOSIS — I10 Essential (primary) hypertension: Secondary | ICD-10-CM

## 2011-05-05 DIAGNOSIS — R609 Edema, unspecified: Secondary | ICD-10-CM

## 2011-05-05 DIAGNOSIS — E785 Hyperlipidemia, unspecified: Secondary | ICD-10-CM

## 2011-05-05 MED ORDER — OMEPRAZOLE 20 MG PO CPDR: 20.0000 mg | DELAYED_RELEASE_CAPSULE | Freq: Every day | ORAL | Status: DC

## 2011-05-05 MED ORDER — FUROSEMIDE 40 MG PO TABS: 40.0000 mg | ORAL_TABLET | Freq: Every day | ORAL | Status: DC

## 2011-05-05 MED ORDER — CARVEDILOL 6.25 MG PO TABS: 6.2500 mg | ORAL_TABLET | Freq: Two times a day (BID) | ORAL | Status: DC

## 2011-05-05 MED ORDER — SIMVASTATIN 40 MG PO TABS: 40.0000 mg | ORAL_TABLET | Freq: Every day | ORAL | Status: DC

## 2011-05-05 NOTE — Telephone Encounter (Signed)
Pt came in office today, I gave her written RX's

## 2011-05-05 NOTE — Progress Notes (Signed)
Pt needs written RX's for mail order. Company called patient and told them they faxed form to our office. I have not seen any requests. I printed and gave to pt so she could mail in herself.

## 2011-05-18 ENCOUNTER — Encounter: Payer: Self-pay | Admitting: Internal Medicine

## 2011-06-01 ENCOUNTER — Encounter: Payer: Self-pay | Admitting: Internal Medicine

## 2011-06-01 ENCOUNTER — Ambulatory Visit (INDEPENDENT_AMBULATORY_CARE_PROVIDER_SITE_OTHER): Payer: Self-pay | Admitting: Internal Medicine

## 2011-06-01 VITALS — BP 142/80 | HR 91 | Temp 97.8°F | Ht 62.0 in | Wt 161.0 lb

## 2011-06-01 DIAGNOSIS — E785 Hyperlipidemia, unspecified: Secondary | ICD-10-CM

## 2011-06-01 DIAGNOSIS — E119 Type 2 diabetes mellitus without complications: Secondary | ICD-10-CM

## 2011-06-01 LAB — LIPID PANEL
Cholesterol: 167 mg/dL (ref 0–200)
HDL: 40.9 mg/dL (ref >39.00)
LDL Cholesterol: 92 mg/dL (ref 0–99)
Total CHOL/HDL Ratio: 4
Triglycerides: 172 mg/dL — ABNORMAL HIGH (ref 0.0–149.0)
VLDL: 34.4 mg/dL (ref 0.0–40.0)

## 2011-06-01 LAB — COMPREHENSIVE METABOLIC PANEL
ALT: 50 U/L — ABNORMAL HIGH (ref 0–35)
AST: 36 U/L (ref 0–37)
Albumin: 4.2 g/dL (ref 3.5–5.2)
Alkaline Phosphatase: 68 U/L (ref 39–117)
BUN: 16 mg/dL (ref 6–23)
CO2: 30 mEq/L (ref 19–32)
Calcium: 9.7 mg/dL (ref 8.4–10.5)
Chloride: 101 mEq/L (ref 96–112)
Creatinine, Ser: 0.7 mg/dL (ref 0.4–1.2)
GFR: 85.77 mL/min (ref >60.00)
Glucose, Bld: 131 mg/dL — ABNORMAL HIGH (ref 70–99)
Potassium: 5.5 mEq/L — ABNORMAL HIGH (ref 3.5–5.1)
Sodium: 138 mEq/L (ref 135–145)
Total Bilirubin: 0.3 mg/dL (ref 0.3–1.2)
Total Protein: 7 g/dL (ref 6.0–8.3)

## 2011-06-01 LAB — HEMOGLOBIN A1C: Hgb A1c MFr Bld: 6.8 % — ABNORMAL HIGH (ref 4.6–6.5)

## 2011-06-01 NOTE — Assessment & Plan Note (Signed)
Will check lipids with labs today.  

## 2011-06-01 NOTE — Progress Notes (Signed)
Subjective:    Patient ID: Amber Glenn, female    DOB: May 24, 1950, 61 y.o.   MRN: [id number]  HPI 61YO female with h/o DM presents for follow up.  Reports that she is trying to make positive changes in diet, by limiting intake of sugar and eating sugar-free desserts.  She has also limited intake of saturated fat and has changed to mostly chicken or fish instead of meat.  She does not check her BG.  She has never taken medication for DM.  Outpatient Encounter Prescriptions as of 06/01/2011  Medication Sig Dispense Refill  . Ascorbic Acid (VITAMIN C) 1000 MG tablet Take 1,000 mg by mouth daily.        Marland Kitchen aspirin 81 MG tablet Take 81 mg by mouth daily.        . carvedilol (COREG) 6.25 MG tablet Take 1 tablet (6.25 mg total) by mouth 2 (two) times daily.  180 tablet  3  . fish oil-omega-3 fatty acids 1000 MG capsule Take 2 g by mouth daily.        . furosemide (LASIX) 40 MG tablet Take 1 tablet (40 mg total) by mouth daily.  90 tablet  3  . Multiple Vitamin (MULTIVITAMIN) capsule Take 1 capsule by mouth daily.        Marland Kitchen omeprazole (PRILOSEC) 20 MG capsule Take 1 capsule (20 mg total) by mouth daily.  90 capsule  3  . Omeprazole Magnesium (PRILOSEC OTC PO) Take 1 tablet by mouth daily.        . simvastatin (ZOCOR) 40 MG tablet Take 1 tablet (40 mg total) by mouth at bedtime.  90 tablet  3    Review of Systems  Constitutional: Negative for fever, chills, appetite change, fatigue and unexpected weight change.  HENT: Negative for ear pain, congestion, sore throat, trouble swallowing, neck pain, voice change and sinus pressure.   Eyes: Negative for visual disturbance.  Respiratory: Negative for cough, shortness of breath, wheezing and stridor.   Cardiovascular: Negative for chest pain, palpitations and leg swelling.  Gastrointestinal: Negative for nausea, vomiting, abdominal pain, diarrhea, constipation, blood in stool, abdominal distention and anal bleeding.  Genitourinary: Negative for dysuria  and flank pain.  Musculoskeletal: Negative for myalgias, arthralgias and gait problem.  Skin: Negative for color change and rash.  Neurological: Negative for dizziness and headaches.  Hematological: Negative for adenopathy. Does not bruise/bleed easily.  Psychiatric/Behavioral: Negative for suicidal ideas, sleep disturbance and dysphoric mood. The patient is not nervous/anxious.    BP 142/80  Pulse 91  Temp(Src) 97.8 F (36.6 C) (Oral)  Ht 5\' 2"  (1.575 m)  Wt 161 lb (73.029 kg)  BMI 29.45 kg/m2  SpO2 96%     Objective:   Physical Exam  Constitutional: She is oriented to person, place, and time. She appears well-developed and well-nourished. No distress.  HENT:  Head: Normocephalic and atraumatic.  Right Ear: External ear normal.  Left Ear: External ear normal.  Nose: Nose normal.  Mouth/Throat: Oropharynx is clear and moist. No oropharyngeal exudate.  Eyes: Conjunctivae are normal. Pupils are equal, round, and reactive to light. Right eye exhibits no discharge. Left eye exhibits no discharge. No scleral icterus.  Neck: Normal range of motion. Neck supple. No tracheal deviation present. No thyromegaly present.  Cardiovascular: Normal rate, regular rhythm, normal heart sounds and intact distal pulses.  Exam reveals no gallop and no friction rub.   No murmur heard. Pulmonary/Chest: Effort normal and breath sounds normal. No respiratory distress. She has  no wheezes. She has no rales. She exhibits no tenderness.  Musculoskeletal: Normal range of motion. She exhibits no edema and no tenderness.  Lymphadenopathy:    She has no cervical adenopathy.  Neurological: She is alert and oriented to person, place, and time. No cranial nerve deficit. She exhibits normal muscle tone. Coordination normal.  Skin: Skin is warm and dry. No rash noted. She is not diaphoretic. No erythema. No pallor.  Psychiatric: She has a normal mood and affect. Her behavior is normal. Judgment and thought content  normal.          Assessment & Plan:

## 2011-06-01 NOTE — Assessment & Plan Note (Signed)
Will recheck A1c, CMP and lipids with labs today.  If no improvement in A1c, then will start Metformin.  Follow up 1 month.

## 2011-06-02 ENCOUNTER — Telehealth: Payer: Self-pay | Admitting: *Deleted

## 2011-06-02 MED ORDER — METFORMIN HCL 500 MG PO TABS: 500.0000 mg | ORAL_TABLET | Freq: Two times a day (BID) | ORAL | Status: DC

## 2011-06-02 NOTE — Telephone Encounter (Signed)
Pt left VM - she is willing to start med. See copied and pasted below:   Entered by Shelia Media, MD at 06/01/2011 1:34 PM Read by Blanche East at 06/02/2011 11:34 AM Labs show that A1c is still elevated. I would like to start Metformin 500mg  twice daily. If you are willing to try this, let us know and we can call it in. We should repeat labs in 3 months.

## 2011-07-15 ENCOUNTER — Ambulatory Visit: Payer: 59 | Admitting: Internal Medicine

## 2011-08-05 ENCOUNTER — Telehealth: Payer: Self-pay | Admitting: Internal Medicine

## 2011-08-05 NOTE — Telephone Encounter (Signed)
[phone] Pt called to see if you received paperwork from insurance (for assistance program) come for her for all her rx's Please advise patient

## 2011-08-05 NOTE — Telephone Encounter (Signed)
Rob Bunting, have you seen this paper work by an chance?

## 2011-08-05 NOTE — Telephone Encounter (Signed)
Not in any paperwork on Dr. Tilman Neat CMA's desk.  Not in stack of faxed paperwork on CMA desk.

## 2011-08-06 NOTE — Telephone Encounter (Signed)
Patient notified that we have not received anything. Patient is going to call and request it again.

## 2011-08-06 NOTE — Telephone Encounter (Signed)
Caller: Maycel/Patient; Phone Number: [phone]; Message from caller: Re: Patient calling to see if office received Rx papers from [employer] Assistance Program.

## 2011-08-06 NOTE — Telephone Encounter (Signed)
I have not received these. °

## 2011-08-10 ENCOUNTER — Telehealth: Payer: Self-pay | Admitting: *Deleted

## 2011-08-10 NOTE — Telephone Encounter (Signed)
Called pt to inform that we had recvd [employer] Assistance forms and that the Rx[s] had been signed by JAW & were ready for p/u between 8a-5p Mon-Fri/SLS She will p/u later today.

## 2011-08-18 ENCOUNTER — Encounter: Payer: Self-pay | Admitting: Internal Medicine

## 2011-08-18 ENCOUNTER — Ambulatory Visit (INDEPENDENT_AMBULATORY_CARE_PROVIDER_SITE_OTHER): Payer: PRIVATE HEALTH INSURANCE | Admitting: Internal Medicine

## 2011-08-18 VITALS — BP 120/72 | HR 91 | Temp 98.6°F | Wt 160.0 lb

## 2011-08-18 DIAGNOSIS — I10 Essential (primary) hypertension: Secondary | ICD-10-CM

## 2011-08-18 DIAGNOSIS — E785 Hyperlipidemia, unspecified: Secondary | ICD-10-CM

## 2011-08-18 DIAGNOSIS — E119 Type 2 diabetes mellitus without complications: Secondary | ICD-10-CM

## 2011-08-18 NOTE — Assessment & Plan Note (Signed)
Goal LDL less than 70. Will plan to recheck LFTs and lipids once insurance coverage is in place for lab work. Continue Zocor. Followup in 3 months.

## 2011-08-18 NOTE — Progress Notes (Signed)
Subjective:    Patient ID: Amber Glenn, female    DOB: [birth year], 61 y.o.   MRN: [id number]  HPI  61 year old female with history of diabetes, hypertension, hyperlipidemia presents for followup. In regards to her diabetes, she reports most fasting sugars near 100. She did not bring a record of blood sugars today. Last hemoglobin A1c was 6.7%. She reports full compliance with metformin with no noted side effects. In regards to her hypertension hyperlipidemia, she also reports full compliance with her medications. She denies any chest pain, headache, palpitations, shortness of breath, muscle aches, or other symptoms.  Outpatient Encounter Prescriptions as of 08/18/2011  Medication Sig Dispense Refill  . Ascorbic Acid (VITAMIN C) 1000 MG tablet Take 1,000 mg by mouth daily.        Marland Kitchen aspirin 81 MG tablet Take 81 mg by mouth daily.        . carvedilol (COREG) 6.25 MG tablet Take 1 tablet (6.25 mg total) by mouth 2 (two) times daily.  180 tablet  3  . furosemide (LASIX) 40 MG tablet Take 1 tablet (40 mg total) by mouth daily.  90 tablet  3  . metFORMIN (GLUCOPHAGE) 500 MG tablet Take 1 tablet (500 mg total) by mouth 2 (two) times daily with a meal.  180 tablet  1  . Multiple Vitamin (MULTIVITAMIN) capsule Take 1 capsule by mouth daily.        Marland Kitchen omeprazole (PRILOSEC) 20 MG capsule Take 1 capsule (20 mg total) by mouth daily.  90 capsule  3  . simvastatin (ZOCOR) 40 MG tablet Take 1 tablet (40 mg total) by mouth at bedtime.  90 tablet  3    Review of Systems  Constitutional: Negative for fever, chills, appetite change, fatigue and unexpected weight change.  HENT: Negative for ear pain, congestion, sore throat, trouble swallowing, neck pain, voice change and sinus pressure.   Eyes: Negative for visual disturbance.  Respiratory: Negative for cough, shortness of breath, wheezing and stridor.   Cardiovascular: Negative for chest pain, palpitations and leg swelling.  Gastrointestinal: Negative for  nausea, vomiting, abdominal pain, diarrhea, constipation, blood in stool, abdominal distention and anal bleeding.  Genitourinary: Negative for dysuria and flank pain.  Musculoskeletal: Negative for myalgias, arthralgias and gait problem.  Skin: Negative for color change and rash.  Neurological: Negative for dizziness and headaches.  Hematological: Negative for adenopathy. Does not bruise/bleed easily.  Psychiatric/Behavioral: Negative for suicidal ideas, sleep disturbance and dysphoric mood. The patient is not nervous/anxious.    BP 120/72  Pulse 91  Temp(Src) 98.6 F (37 C) (Oral)  Wt 160 lb (72.576 kg)  SpO2 94%     Objective:   Physical Exam  Constitutional: She is oriented to person, place, and time. She appears well-developed and well-nourished. No distress.  HENT:  Head: Normocephalic and atraumatic.  Right Ear: External ear normal.  Left Ear: External ear normal.  Nose: Nose normal.  Mouth/Throat: Oropharynx is clear and moist. No oropharyngeal exudate.  Eyes: Conjunctivae are normal. Pupils are equal, round, and reactive to light. Right eye exhibits no discharge. Left eye exhibits no discharge. No scleral icterus.  Neck: Normal range of motion. Neck supple. No tracheal deviation present. No thyromegaly present.  Cardiovascular: Normal rate, regular rhythm, normal heart sounds and intact distal pulses.  Exam reveals no gallop and no friction rub.   No murmur heard. Pulmonary/Chest: Effort normal and breath sounds normal. No respiratory distress. She has no wheezes. She has no rales. She exhibits no  tenderness.  Abdominal: Soft. Bowel sounds are normal. She exhibits no distension. There is no tenderness.  Musculoskeletal: Normal range of motion. She exhibits no edema and no tenderness.  Lymphadenopathy:    She has no cervical adenopathy.  Neurological: She is alert and oriented to person, place, and time. No cranial nerve deficit. She exhibits normal muscle tone. Coordination  normal.  Skin: Skin is warm and dry. No rash noted. She is not diaphoretic. No erythema. No pallor.  Psychiatric: She has a normal mood and affect. Her behavior is normal. Judgment and thought content normal.          Assessment & Plan:

## 2011-08-18 NOTE — Assessment & Plan Note (Signed)
Blood pressure well-controlled today. We'll continue carvedilol. Will check renal function with labs once insurance coverage is in place. Followup in 3 months.

## 2011-08-18 NOTE — Assessment & Plan Note (Signed)
Patient reports blood sugars have been well-controlled. Will plan to check A1c again once her insurance will cover this. She will followup in 3 months.

## 2011-08-31 ENCOUNTER — Telehealth: Payer: Self-pay | Admitting: Internal Medicine

## 2011-08-31 NOTE — Telephone Encounter (Signed)
[phone] Pt called the assistants program is faxing more paperwork to you.  Please let pt know when these are ready she will pick these up

## 2011-08-31 NOTE — Telephone Encounter (Signed)
No has seen documents as of today.

## 2011-09-01 NOTE — Telephone Encounter (Signed)
Patient inquiring again to status of Insurance documents; not arrived at [facility] as of yet.

## 2011-09-03 NOTE — Telephone Encounter (Signed)
Informed patient paperwork for patient assistance has been received and we will call when ready for p/u; patient will be out of town all next week, Ok to [employer]

## 2011-11-19 ENCOUNTER — Encounter: Payer: Self-pay | Admitting: Internal Medicine

## 2011-11-19 ENCOUNTER — Ambulatory Visit (INDEPENDENT_AMBULATORY_CARE_PROVIDER_SITE_OTHER): Payer: PRIVATE HEALTH INSURANCE | Admitting: Internal Medicine

## 2011-11-19 VITALS — BP 150/90 | HR 84 | Temp 97.8°F | Ht 62.0 in | Wt 156.8 lb

## 2011-11-19 DIAGNOSIS — I10 Essential (primary) hypertension: Secondary | ICD-10-CM

## 2011-11-19 DIAGNOSIS — E785 Hyperlipidemia, unspecified: Secondary | ICD-10-CM

## 2011-11-19 DIAGNOSIS — E119 Type 2 diabetes mellitus without complications: Secondary | ICD-10-CM

## 2011-11-19 LAB — MICROALBUMIN / CREATININE URINE RATIO
Creatinine,U: 136.5 mg/dL
Microalb Creat Ratio: 0.9 mg/g (ref 0.0–30.0)
Microalb, Ur: 1.2 mg/dL (ref 0.0–1.9)

## 2011-11-19 LAB — LIPID PANEL
Cholesterol: 144 mg/dL (ref 0–200)
HDL: 42.1 mg/dL (ref >39.00)
LDL Cholesterol: 71 mg/dL (ref 0–99)
Total CHOL/HDL Ratio: 3
Triglycerides: 154 mg/dL — ABNORMAL HIGH (ref 0.0–149.0)
VLDL: 30.8 mg/dL (ref 0.0–40.0)

## 2011-11-19 LAB — COMPREHENSIVE METABOLIC PANEL
ALT: 38 U/L — ABNORMAL HIGH (ref 0–35)
AST: 33 U/L (ref 0–37)
Albumin: 4 g/dL (ref 3.5–5.2)
Alkaline Phosphatase: 54 U/L (ref 39–117)
BUN: 12 mg/dL (ref 6–23)
CO2: 28 mEq/L (ref 19–32)
Calcium: 9.2 mg/dL (ref 8.4–10.5)
Chloride: 101 mEq/L (ref 96–112)
Creatinine, Ser: 0.7 mg/dL (ref 0.4–1.2)
GFR: 87.01 mL/min (ref >60.00)
Glucose, Bld: 126 mg/dL — ABNORMAL HIGH (ref 70–99)
Potassium: 3.9 mEq/L (ref 3.5–5.1)
Sodium: 137 mEq/L (ref 135–145)
Total Bilirubin: 0.6 mg/dL (ref 0.3–1.2)
Total Protein: 7 g/dL (ref 6.0–8.3)

## 2011-11-19 LAB — HEMOGLOBIN A1C: Hgb A1c MFr Bld: 6.1 % (ref 4.6–6.5)

## 2011-11-19 NOTE — Assessment & Plan Note (Signed)
Pt reports good control of blood sugar. Will check A1c with labs today. Continue Metformin. Follow up 3 months.

## 2011-11-19 NOTE — Assessment & Plan Note (Signed)
Will check lipids and LFTs with labs today. 

## 2011-11-19 NOTE — Assessment & Plan Note (Addendum)
BP elevated today, however pt has not taken meds. Will have pt monitor at home and call if consistently >140/90. Continue current medications. Check renal function with labs today. Consider adding low dose lisinopril after labs returned.

## 2011-11-19 NOTE — Progress Notes (Signed)
Subjective:    Patient ID: Amber Glenn, female    DOB: 1950-11-16, 61 y.o.   MRN: [id number]  HPI 61YO female with h/o DM, HL, and HTN presents for follow up. Doing well. BG typically 100-130 based on her report. Full compliance with medications. Has not been checking BP regularly, but reports compliance with coreg. No chest pain, palpitations, headache.  Outpatient Encounter Prescriptions as of 11/19/2011  Medication Sig Dispense Refill  . Ascorbic Acid (VITAMIN C) 1000 MG tablet Take 1,000 mg by mouth daily.        Marland Kitchen aspirin 81 MG tablet Take 81 mg by mouth daily.        . carvedilol (COREG) 6.25 MG tablet Take 1 tablet (6.25 mg total) by mouth 2 (two) times daily.  180 tablet  3  . furosemide (LASIX) 40 MG tablet Take 1 tablet (40 mg total) by mouth daily.  90 tablet  3  . metFORMIN (GLUCOPHAGE) 500 MG tablet Take 1 tablet (500 mg total) by mouth 2 (two) times daily with a meal.  180 tablet  1  . Multiple Vitamin (MULTIVITAMIN) capsule Take 1 capsule by mouth daily.        Marland Kitchen omeprazole (PRILOSEC) 20 MG capsule Take 1 capsule (20 mg total) by mouth daily.  90 capsule  3  . simvastatin (ZOCOR) 40 MG tablet Take 1 tablet (40 mg total) by mouth at bedtime.  90 tablet  3   BP 150/90  Pulse 84  Temp 97.8 F (36.6 C) (Oral)  Ht 5\' 2"  (1.575 m)  Wt 156 lb 12 oz (71.101 kg)  BMI 28.67 kg/m2  SpO2 93%  Review of Systems  Constitutional: Negative for fever, chills, appetite change, fatigue and unexpected weight change.  HENT: Negative for ear pain, congestion, sore throat, trouble swallowing, neck pain, voice change and sinus pressure.   Eyes: Negative for visual disturbance.  Respiratory: Negative for cough, shortness of breath, wheezing and stridor.   Cardiovascular: Negative for chest pain, palpitations and leg swelling.  Gastrointestinal: Negative for nausea, vomiting, abdominal pain, diarrhea, constipation, blood in stool, abdominal distention and anal bleeding.  Genitourinary:  Negative for dysuria and flank pain.  Musculoskeletal: Negative for myalgias, arthralgias and gait problem.  Skin: Negative for color change and rash.  Neurological: Negative for dizziness and headaches.  Hematological: Negative for adenopathy. Does not bruise/bleed easily.  Psychiatric/Behavioral: Negative for suicidal ideas, disturbed wake/sleep cycle and dysphoric mood. The patient is not nervous/anxious.        Objective:   Physical Exam  Constitutional: She is oriented to person, place, and time. She appears well-developed and well-nourished. No distress.  HENT:  Head: Normocephalic and atraumatic.  Right Ear: External ear normal.  Left Ear: External ear normal.  Nose: Nose normal.  Mouth/Throat: Oropharynx is clear and moist. No oropharyngeal exudate.  Eyes: Conjunctivae are normal. Pupils are equal, round, and reactive to light. Right eye exhibits no discharge. Left eye exhibits no discharge. No scleral icterus.  Neck: Normal range of motion. Neck supple. No tracheal deviation present. No thyromegaly present.  Cardiovascular: Normal rate, regular rhythm, normal heart sounds and intact distal pulses.  Exam reveals no gallop and no friction rub.   No murmur heard. Pulmonary/Chest: Effort normal and breath sounds normal. No respiratory distress. She has no wheezes. She has no rales. She exhibits no tenderness.  Musculoskeletal: Normal range of motion. She exhibits no edema and no tenderness.  Lymphadenopathy:    She has no cervical adenopathy.  Neurological: She is alert and oriented to person, place, and time. No cranial nerve deficit. She exhibits normal muscle tone. Coordination normal.  Skin: Skin is warm and dry. No rash noted. She is not diaphoretic. No erythema. No pallor.  Psychiatric: She has a normal mood and affect. Her behavior is normal. Judgment and thought content normal.          Assessment & Plan:

## 2011-11-22 ENCOUNTER — Other Ambulatory Visit: Payer: Self-pay | Admitting: *Deleted

## 2011-11-22 DIAGNOSIS — I10 Essential (primary) hypertension: Secondary | ICD-10-CM

## 2011-11-22 MED ORDER — CARVEDILOL 6.25 MG PO TABS: 6.2500 mg | ORAL_TABLET | Freq: Two times a day (BID) | ORAL | Status: DC

## 2011-11-22 NOTE — Telephone Encounter (Signed)
Rx faxed to [facility] to Access at [phone].

## 2011-11-25 ENCOUNTER — Other Ambulatory Visit: Payer: Self-pay | Admitting: *Deleted

## 2011-11-25 DIAGNOSIS — E785 Hyperlipidemia, unspecified: Secondary | ICD-10-CM

## 2011-11-25 MED ORDER — SIMVASTATIN 40 MG PO TABS: 40.0000 mg | ORAL_TABLET | Freq: Every day | ORAL | Status: DC

## 2011-11-25 NOTE — Telephone Encounter (Signed)
Patient called requesting written Rx for Simvastatin be faxed to [phone].  Rx faxed.

## 2011-12-02 ENCOUNTER — Other Ambulatory Visit: Payer: Self-pay | Admitting: Internal Medicine

## 2012-01-17 LAB — HM MAMMOGRAPHY: HM Mammogram: NORMAL

## 2012-01-18 ENCOUNTER — Ambulatory Visit: Payer: Self-pay | Admitting: Internal Medicine

## 2012-01-25 ENCOUNTER — Other Ambulatory Visit: Payer: Self-pay | Admitting: Internal Medicine

## 2012-01-25 ENCOUNTER — Encounter: Payer: Self-pay | Admitting: Internal Medicine

## 2012-02-17 ENCOUNTER — Ambulatory Visit (INDEPENDENT_AMBULATORY_CARE_PROVIDER_SITE_OTHER): Payer: PRIVATE HEALTH INSURANCE | Admitting: Internal Medicine

## 2012-02-17 ENCOUNTER — Encounter: Payer: Self-pay | Admitting: Internal Medicine

## 2012-02-17 VITALS — BP 132/80 | HR 82 | Temp 97.8°F | Ht 62.0 in | Wt 158.0 lb

## 2012-02-17 DIAGNOSIS — I10 Essential (primary) hypertension: Secondary | ICD-10-CM

## 2012-02-17 DIAGNOSIS — E785 Hyperlipidemia, unspecified: Secondary | ICD-10-CM

## 2012-02-17 DIAGNOSIS — E119 Type 2 diabetes mellitus without complications: Secondary | ICD-10-CM

## 2012-02-17 NOTE — Assessment & Plan Note (Signed)
Will check lipids and LFTs with labs through [facility]. Continue Simvastatin.

## 2012-02-17 NOTE — Progress Notes (Signed)
Subjective:    Patient ID: Amber Glenn, female    DOB: 11/13/1950, 61 y.o.   MRN: [id number]  HPI 61 year old female with history of hypertension, diabetes, hyperlipidemia presents for followup. She reports she is generally been doing well. She reports compliance with her medications. She has not recently checked her blood sugar. She has been working out issues with her [employer] and reports that she has now been approved to have lab testing including her A1c performed. She is waiting until next year for preventative care such as colonoscopy. She denies any new concerns today.  Outpatient Encounter Prescriptions as of 02/17/2012  Medication Sig Dispense Refill  . Ascorbic Acid (VITAMIN C) 1000 MG tablet Take 1,000 mg by mouth daily.        Marland Kitchen aspirin 81 MG tablet Take 81 mg by mouth daily.        . carvedilol (COREG) 6.25 MG tablet Take 6.25 mg by mouth 2 (two) times daily. BRAND NAME ONLY      . furosemide (LASIX) 40 MG tablet TAKE 1 TABLET BY MOUTH EVERY DAY  90 tablet  0  . metFORMIN (GLUCOPHAGE) 500 MG tablet Take 1 tablet (500 mg total) by mouth 2 (two) times daily with a meal.  180 tablet  1  . Multiple Vitamin (MULTIVITAMIN) capsule Take 1 capsule by mouth daily.        Marland Kitchen omeprazole (PRILOSEC) 20 MG capsule Take 1 capsule (20 mg total) by mouth daily.  90 capsule  3  . simvastatin (ZOCOR) 40 MG tablet TAKE 1 TABLET BY MOUTH EVERY DAY  90 tablet  0  . [DISCONTINUED] carvedilol (COREG) 6.25 MG tablet Take 1 tablet (6.25 mg total) by mouth 2 (two) times daily.  180 tablet  3   BP 132/80  Pulse 82  Temp 97.8 F (36.6 C) (Oral)  Ht 5\' 2"  (1.575 m)  Wt 158 lb (71.668 kg)  BMI 28.90 kg/m2  SpO2 95%  Review of Systems  Constitutional: Negative for fever, chills, appetite change, fatigue and unexpected weight change.  HENT: Negative for ear pain, congestion, sore throat, trouble swallowing, neck pain, voice change and sinus pressure.   Eyes: Negative for visual disturbance.    Respiratory: Negative for cough, shortness of breath, wheezing and stridor.   Cardiovascular: Negative for chest pain, palpitations and leg swelling.  Gastrointestinal: Negative for nausea, vomiting, abdominal pain, diarrhea, constipation, blood in stool, abdominal distention and anal bleeding.  Genitourinary: Negative for dysuria and flank pain.  Musculoskeletal: Negative for myalgias, arthralgias and gait problem.  Skin: Negative for color change and rash.  Neurological: Negative for dizziness and headaches.  Hematological: Negative for adenopathy. Does not bruise/bleed easily.  Psychiatric/Behavioral: Negative for suicidal ideas, sleep disturbance and dysphoric mood. The patient is not nervous/anxious.        Objective:   Physical Exam  Constitutional: She is oriented to person, place, and time. She appears well-developed and well-nourished. No distress.  HENT:  Head: Normocephalic and atraumatic.  Right Ear: External ear normal.  Left Ear: External ear normal.  Nose: Nose normal.  Mouth/Throat: Oropharynx is clear and moist. No oropharyngeal exudate.  Eyes: Conjunctivae normal are normal. Pupils are equal, round, and reactive to light. Right eye exhibits no discharge. Left eye exhibits no discharge. No scleral icterus.  Neck: Normal range of motion. Neck supple. No tracheal deviation present. No thyromegaly present.  Cardiovascular: Normal rate, regular rhythm, normal heart sounds and intact distal pulses.  Exam reveals no gallop and  no friction rub.   No murmur heard. Pulmonary/Chest: Effort normal and breath sounds normal. No respiratory distress. She has no wheezes. She has no rales. She exhibits no tenderness.  Musculoskeletal: Normal range of motion. She exhibits no edema and no tenderness.  Lymphadenopathy:    She has no cervical adenopathy.  Neurological: She is alert and oriented to person, place, and time. No cranial nerve deficit. She exhibits normal muscle tone.  Coordination normal.  Skin: Skin is warm and dry. No rash noted. She is not diaphoretic. No erythema. No pallor.  Psychiatric: She has a normal mood and affect. Her behavior is normal. Judgment and thought content normal.          Assessment & Plan:

## 2012-02-17 NOTE — Assessment & Plan Note (Signed)
BP well controlled. Will continue current medication. Will check renal function with labs through [facility].

## 2012-02-17 NOTE — Assessment & Plan Note (Signed)
Pt reports BG well controlled. Will check A1c with labs through [facility], as provided by [employer]. Follow up 3 months and prn. Continue current medications.

## 2012-02-18 ENCOUNTER — Ambulatory Visit: Payer: PRIVATE HEALTH INSURANCE | Admitting: Internal Medicine

## 2012-02-22 ENCOUNTER — Telehealth: Payer: Self-pay | Admitting: Internal Medicine

## 2012-02-22 NOTE — Telephone Encounter (Signed)
Labs show BG 125 and AST/ALT 43/49

## 2012-02-28 ENCOUNTER — Telehealth: Payer: Self-pay | Admitting: Internal Medicine

## 2012-02-28 NOTE — Telephone Encounter (Signed)
I received labs from outside which showed slight elevation of LFTs. Can you make sure pt has follow up? AST/ALT 43/49

## 2012-02-29 NOTE — Telephone Encounter (Signed)
Patient has an appt 05/2012 for CPX, Would you like patient to be seen sooner?

## 2012-02-29 NOTE — Telephone Encounter (Signed)
Can we just ask her to repeat CMP here in the office early next month, to confirm that liver function tests have improved?

## 2012-03-10 NOTE — Telephone Encounter (Signed)
Dr. Dan Humphreys I spoke with Amber Glenn and she stated that when she gets better health insurance coverage she will call us back to schedule blood work for liver function test.

## 2012-03-13 ENCOUNTER — Other Ambulatory Visit: Payer: Self-pay | Admitting: Internal Medicine

## 2012-03-13 DIAGNOSIS — E785 Hyperlipidemia, unspecified: Secondary | ICD-10-CM

## 2012-03-14 ENCOUNTER — Telehealth: Payer: Self-pay | Admitting: Internal Medicine

## 2012-03-14 NOTE — Telephone Encounter (Signed)
Is this ok?

## 2012-03-14 NOTE — Telephone Encounter (Signed)
That is fine 

## 2012-03-14 NOTE — Telephone Encounter (Signed)
Pt called to see if she could wait till next month to do her labs.  She has insurance that will take effect next month

## 2012-03-15 NOTE — Telephone Encounter (Signed)
Pt.notified

## 2012-04-25 ENCOUNTER — Telehealth: Payer: Self-pay | Admitting: Internal Medicine

## 2012-04-25 NOTE — Telephone Encounter (Signed)
Pt called wanting to know if her labs that dr walker wanted her to get was fasting?

## 2012-04-26 NOTE — Telephone Encounter (Signed)
Patient advised labs are non fasting labs, lab appt scheduled for 04/28/2012.

## 2012-04-28 ENCOUNTER — Other Ambulatory Visit (INDEPENDENT_AMBULATORY_CARE_PROVIDER_SITE_OTHER): Payer: BC Managed Care – HMO

## 2012-04-28 DIAGNOSIS — E119 Type 2 diabetes mellitus without complications: Secondary | ICD-10-CM

## 2012-04-28 DIAGNOSIS — E785 Hyperlipidemia, unspecified: Secondary | ICD-10-CM

## 2012-04-28 LAB — HEMOGLOBIN A1C: Hgb A1c MFr Bld: 6.5 % (ref 4.6–6.5)

## 2012-04-28 LAB — COMPREHENSIVE METABOLIC PANEL
ALT: 46 U/L — ABNORMAL HIGH (ref 0–35)
AST: 39 U/L — ABNORMAL HIGH (ref 0–37)
Albumin: 4.3 g/dL (ref 3.5–5.2)
Alkaline Phosphatase: 65 U/L (ref 39–117)
BUN: 15 mg/dL (ref 6–23)
CO2: 31 mEq/L (ref 19–32)
Calcium: 9.5 mg/dL (ref 8.4–10.5)
Chloride: 101 mEq/L (ref 96–112)
Creatinine, Ser: 0.8 mg/dL (ref 0.4–1.2)
GFR: 79.22 mL/min (ref >60.00)
Glucose, Bld: 153 mg/dL — ABNORMAL HIGH (ref 70–99)
Potassium: 4.4 mEq/L (ref 3.5–5.1)
Sodium: 139 mEq/L (ref 135–145)
Total Bilirubin: 0.5 mg/dL (ref 0.3–1.2)
Total Protein: 7.7 g/dL (ref 6.0–8.3)

## 2012-04-28 LAB — LIPID PANEL
Cholesterol: 149 mg/dL (ref 0–200)
HDL: 38.9 mg/dL — ABNORMAL LOW (ref >39.00)
LDL Cholesterol: 83 mg/dL (ref 0–99)
Total CHOL/HDL Ratio: 4
Triglycerides: 134 mg/dL (ref 0.0–149.0)
VLDL: 26.8 mg/dL (ref 0.0–40.0)

## 2012-04-28 NOTE — Addendum Note (Signed)
Addended by: Montine Circle D on: 04/28/2012 08:13 AM   Modules accepted: Orders

## 2012-05-23 LAB — HM PAP SMEAR: HM Pap smear: NEGATIVE

## 2012-05-25 ENCOUNTER — Encounter: Payer: PRIVATE HEALTH INSURANCE | Admitting: Internal Medicine

## 2012-05-27 ENCOUNTER — Other Ambulatory Visit: Payer: Self-pay

## 2012-05-29 ENCOUNTER — Ambulatory Visit (INDEPENDENT_AMBULATORY_CARE_PROVIDER_SITE_OTHER): Payer: BC Managed Care – HMO | Admitting: Adult Health

## 2012-05-29 ENCOUNTER — Encounter: Payer: Self-pay | Admitting: Adult Health

## 2012-05-29 ENCOUNTER — Other Ambulatory Visit (HOSPITAL_COMMUNITY)
Admission: RE | Admit: 2012-05-29 | Discharge: 2012-05-29 | Disposition: A | Discharge status: Home / Self Care | Payer: BC Managed Care – HMO | Source: Ambulatory Visit | Attending: Adult Health | Admitting: Adult Health

## 2012-05-29 VITALS — BP 159/76 | HR 93 | Temp 98.0°F | Resp 16 | Ht 62.0 in | Wt 159.0 lb

## 2012-05-29 DIAGNOSIS — Z0000 Encounter for general adult medical examination without abnormal findings: Secondary | ICD-10-CM | POA: Insufficient documentation

## 2012-05-29 DIAGNOSIS — R7401 Elevation of levels of liver transaminase levels: Secondary | ICD-10-CM

## 2012-05-29 DIAGNOSIS — K219 Gastro-esophageal reflux disease without esophagitis: Secondary | ICD-10-CM

## 2012-05-29 DIAGNOSIS — E119 Type 2 diabetes mellitus without complications: Secondary | ICD-10-CM

## 2012-05-29 DIAGNOSIS — R748 Abnormal levels of other serum enzymes: Secondary | ICD-10-CM

## 2012-05-29 DIAGNOSIS — J309 Allergic rhinitis, unspecified: Secondary | ICD-10-CM

## 2012-05-29 DIAGNOSIS — I10 Essential (primary) hypertension: Secondary | ICD-10-CM

## 2012-05-29 DIAGNOSIS — E785 Hyperlipidemia, unspecified: Secondary | ICD-10-CM

## 2012-05-29 DIAGNOSIS — Z1151 Encounter for screening for human papillomavirus (HPV): Secondary | ICD-10-CM | POA: Insufficient documentation

## 2012-05-29 DIAGNOSIS — Z01419 Encounter for gynecological examination (general) (routine) without abnormal findings: Secondary | ICD-10-CM | POA: Insufficient documentation

## 2012-05-29 DIAGNOSIS — Z1211 Encounter for screening for malignant neoplasm of colon: Secondary | ICD-10-CM

## 2012-05-29 LAB — CBC WITH DIFFERENTIAL/PLATELET
Basophils Absolute: 0 10*3/uL (ref 0.0–0.1)
Basophils Relative: 0.6 % (ref 0.0–3.0)
Eosinophils Absolute: 0.1 10*3/uL (ref 0.0–0.7)
Eosinophils Relative: 2.1 % (ref 0.0–5.0)
HCT: 36 % (ref 36.0–46.0)
Hemoglobin: 11.9 g/dL — ABNORMAL LOW (ref 12.0–15.0)
Lymphocytes Relative: 31.1 % (ref 12.0–46.0)
Lymphs Abs: 1.8 10*3/uL (ref 0.7–4.0)
MCHC: 33.2 g/dL (ref 30.0–36.0)
MCV: 88.6 fl (ref 78.0–100.0)
Monocytes Absolute: 0.3 10*3/uL (ref 0.1–1.0)
Monocytes Relative: 5.2 % (ref 3.0–12.0)
Neutro Abs: 3.5 10*3/uL (ref 1.4–7.7)
Neutrophils Relative %: 61 % (ref 43.0–77.0)
Platelets: 271 10*3/uL (ref 150.0–400.0)
RBC: 4.06 Mil/uL (ref 3.87–5.11)
RDW: 13.2 % (ref 11.5–14.6)
WBC: 5.8 10*3/uL (ref 4.5–10.5)

## 2012-05-29 LAB — HEPATIC FUNCTION PANEL
ALT: 43 U/L — ABNORMAL HIGH (ref 0–35)
AST: 41 U/L — ABNORMAL HIGH (ref 0–37)
Albumin: 4.1 g/dL (ref 3.5–5.2)
Alkaline Phosphatase: 63 U/L (ref 39–117)
Bilirubin, Direct: 0.1 mg/dL (ref 0.0–0.3)
Total Bilirubin: 0.6 mg/dL (ref 0.3–1.2)
Total Protein: 6.8 g/dL (ref 6.0–8.3)

## 2012-05-29 LAB — HEMOGLOBIN A1C: Hgb A1c MFr Bld: 6.4 % (ref 4.6–6.5)

## 2012-05-29 MED ORDER — CARVEDILOL 6.25 MG PO TABS: 6.2500 mg | ORAL_TABLET | Freq: Two times a day (BID) | ORAL | Status: DC

## 2012-05-29 MED ORDER — METFORMIN HCL 500 MG PO TABS: 500.0000 mg | ORAL_TABLET | Freq: Two times a day (BID) | ORAL | Status: DC

## 2012-05-29 MED ORDER — OMEPRAZOLE 20 MG PO CPDR: 20.0000 mg | DELAYED_RELEASE_CAPSULE | Freq: Every day | ORAL | Status: DC

## 2012-05-29 NOTE — Assessment & Plan Note (Signed)
Liver enzymes elevated. She has been taking simvastatin 40 mg daily. Will hold simvastatin x 1 month and recheck hepatic panel in 1 month. If still elevated, consider sending for ultrasound.

## 2012-05-29 NOTE — Assessment & Plan Note (Addendum)
Normal physical exam including breast exam and gynecological exam with PAP. Patient has not had a screening colonoscopy at age 62. Will refer for same. Patient requests someone local. Will refer to [facility]. Will check cbc w/diff since patient is scheduled for cataract sgy of right eye. Copy of recent labs provided to patient. Will provide results of cbc once available. Mammogram done in October 2013 and was normal. Next mammogram in 1 year. Prescription refills on metformin, coreg and omeprazole sent to [facility] on [address]. RTC in 1 month for recheck hepatic panel.  RTC in 3 months for HgBAIC.

## 2012-05-29 NOTE — Assessment & Plan Note (Signed)
Refer for screening colonoscopy to [facility] per patient preference.

## 2012-05-29 NOTE — Patient Instructions (Addendum)
  I am referring you to a Gastroenterologist for a screening colonoscopy. Our office will call you with an appointment in April. We will refer you to [facility] in [place].  I will notify you of your PAP smear results once they are available.  Please stop the simvastatin for 1 month and then come in for a recheck of your liver panel.  I have sent your prescriptions to [facility] on [address].

## 2012-05-29 NOTE — Assessment & Plan Note (Signed)
Doing well. She takes OTC allergy medication during seasons of high allergies. Well controlled. No change to plan at present.

## 2012-05-29 NOTE — Assessment & Plan Note (Signed)
Patient is doing well on Metformin. HbgA1C 6.5. Patient reports exercising and eating whole grains, decreasing amount of starchy foods. No side effects reported from metformin. Next HgbAIC in 3 months.

## 2012-05-29 NOTE — Assessment & Plan Note (Signed)
Well controlled on omeprazole 20 mg daily. Occasional breakthrough acid reflux. No concerns today. Continue with omeprazole.

## 2012-05-29 NOTE — Assessment & Plan Note (Signed)
Currently on Coreg and feels this is well controlled. Patient did not take medication this morning. Slightly elevated systolic. Continue current plan. Will adjust as needed.

## 2012-05-29 NOTE — Progress Notes (Signed)
Subjective:    Patient ID: Amber Glenn, female    DOB: December 28, 1950, 62 y.o.   MRN: [id number]  HPI  Patient is a pleasant 62 y/o female with a hx of DM, HLD, HTN, GERD who presents to clinic for a yearly physical including gynecological exam and breast exam. She reports no new concerns as this time.  Health maintenance:  Last PAP smear was 3 years ago. She is due for PAP today. No report of abnormal PAP.  Screening Colonoscopy - Patient has not had a colonoscopy. I will refer to GI for screening for colon cancer.  Eye Exam - Recent examination. She is scheduled for cataract sgy w/ corneal implant of the right eye and is requesting recent labs to take with her.  Diabetic Foot Exam - Will be done today.  Mammography - Done at [facility] in 01/2012.  Breast exam - clinical breast exam done today.  Depression Screen - Done today. No anhedonia, feelings of sadness or depair. Patient is engaging in conversation.   Review of Systems  Constitutional: Negative for fever, chills, appetite change and fatigue.  HENT: Negative for ear pain, congestion, sore throat, rhinorrhea, trouble swallowing and postnasal drip.   Eyes: Negative for pain, redness and itching.       Scheduled for right eye cataract sgy and corneal implant. The left eye will be done at a later date.  Respiratory: Negative for cough, chest tightness, shortness of breath and wheezing.   Cardiovascular: Negative for chest pain.       Occasional end of day leg swelling. On lasix   Gastrointestinal: Negative for nausea, vomiting, abdominal pain, diarrhea, constipation and blood in stool.  Endocrine: Positive for polydipsia. Negative for cold intolerance, heat intolerance, polyphagia and polyuria.  Genitourinary: Positive for vaginal discharge. Negative for dysuria, urgency, frequency, hematuria, flank pain, vaginal bleeding and difficulty urinating.       Yellowish discharge  Musculoskeletal: Negative for back pain  and gait problem.  Skin: Negative for pallor and rash.  Allergic/Immunologic: Positive for environmental allergies.       Takes OTC allergy medication.  Neurological: Negative for dizziness, seizures, syncope, light-headedness, numbness and headaches.  Psychiatric/Behavioral: Negative for suicidal ideas, behavioral problems, confusion and self-injury. The patient is not nervous/anxious.     BP 159/76  Pulse 93  Temp(Src) 98 F (36.7 C) (Oral)  Resp 16  Ht 5\' 2"  (1.575 m)  Wt 159 lb (72.122 kg)  BMI 29.07 kg/m2  SpO2 96%     Objective:   Physical Exam  Constitutional: She is oriented to person, place, and time. She appears well-developed and well-nourished. No distress.  HENT:  Head: Normocephalic and atraumatic.  Right Ear: External ear normal.  Nose: Nose normal.  Mouth/Throat: Oropharynx is clear and moist.  Left ear external canal with slight erythema. No c/o.  Eyes: Conjunctivae and EOM are normal. Pupils are equal, round, and reactive to light. Right eye exhibits no discharge. Left eye exhibits no discharge. No scleral icterus.  Neck: Normal range of motion. Neck supple. No JVD present. No tracheal deviation present. No thyromegaly present.  Cardiovascular: Normal rate, regular rhythm, normal heart sounds and intact distal pulses.  Exam reveals no gallop and no friction rub.   No murmur heard. Pulmonary/Chest: Effort normal and breath sounds normal. No respiratory distress. She has no wheezes. She has no rales.  Abdominal: Soft. Bowel sounds are normal. She exhibits no distension and no mass. There is no tenderness. There is no  rebound and no guarding.  Genitourinary: Vagina normal and uterus normal. No breast swelling, tenderness, discharge or bleeding. Pelvic exam was performed with patient supine. No labial fusion. There is no rash, tenderness, lesion or injury on the right labia. There is no rash, tenderness, lesion or injury on the left labia. No erythema, tenderness or  bleeding around the vagina. No vaginal discharge found.  Hemocult sent. No cystocele, rectocele noted.  Musculoskeletal: Normal range of motion. She exhibits no edema and no tenderness.  Lymphadenopathy:    She has no cervical adenopathy.       Right: No inguinal adenopathy present.       Left: No inguinal adenopathy present.  Neurological: She is alert and oriented to person, place, and time. No cranial nerve deficit. Coordination normal.  Skin: Skin is warm and dry. No rash noted. No erythema.  Psychiatric: She has a normal mood and affect. Her behavior is normal. Judgment and thought content normal.       Assessment & Plan:

## 2012-07-07 ENCOUNTER — Other Ambulatory Visit (INDEPENDENT_AMBULATORY_CARE_PROVIDER_SITE_OTHER): Payer: BC Managed Care – HMO

## 2012-07-07 ENCOUNTER — Telehealth: Payer: Self-pay | Admitting: *Deleted

## 2012-07-07 DIAGNOSIS — E785 Hyperlipidemia, unspecified: Secondary | ICD-10-CM

## 2012-07-07 DIAGNOSIS — R7401 Elevation of levels of liver transaminase levels: Secondary | ICD-10-CM

## 2012-07-07 LAB — COMPREHENSIVE METABOLIC PANEL
ALT: 58 U/L — ABNORMAL HIGH (ref 0–35)
AST: 45 U/L — ABNORMAL HIGH (ref 0–37)
Albumin: 4.1 g/dL (ref 3.5–5.2)
Alkaline Phosphatase: 65 U/L (ref 39–117)
BUN: 15 mg/dL (ref 6–23)
CO2: 26 mEq/L (ref 19–32)
Calcium: 9.2 mg/dL (ref 8.4–10.5)
Chloride: 100 mEq/L (ref 96–112)
Creatinine, Ser: 0.8 mg/dL (ref 0.4–1.2)
GFR: 78.02 mL/min (ref >60.00)
Glucose, Bld: 134 mg/dL — ABNORMAL HIGH (ref 70–99)
Potassium: 3.8 mEq/L (ref 3.5–5.1)
Sodium: 136 mEq/L (ref 135–145)
Total Bilirubin: 0.4 mg/dL (ref 0.3–1.2)
Total Protein: 7.3 g/dL (ref 6.0–8.3)

## 2012-07-07 LAB — LIPID PANEL
Cholesterol: 255 mg/dL — ABNORMAL HIGH (ref 0–200)
HDL: 38.4 mg/dL — ABNORMAL LOW (ref >39.00)
Total CHOL/HDL Ratio: 7
Triglycerides: 224 mg/dL — ABNORMAL HIGH (ref 0.0–149.0)
VLDL: 44.8 mg/dL — ABNORMAL HIGH (ref 0.0–40.0)

## 2012-07-07 LAB — LDL CHOLESTEROL, DIRECT: Direct LDL: 195.5 mg/dL

## 2012-07-07 NOTE — Telephone Encounter (Signed)
What labs and dx would like for this pt?  

## 2012-07-07 NOTE — Telephone Encounter (Signed)
CMP and lipid profile 272.4 and 790.4

## 2012-07-11 ENCOUNTER — Ambulatory Visit (INDEPENDENT_AMBULATORY_CARE_PROVIDER_SITE_OTHER): Payer: BC Managed Care – HMO | Admitting: Internal Medicine

## 2012-07-11 ENCOUNTER — Encounter: Payer: Self-pay | Admitting: Internal Medicine

## 2012-07-11 VITALS — BP 130/78 | HR 88 | Temp 97.7°F | Resp 18 | Wt 158.8 lb

## 2012-07-11 DIAGNOSIS — E119 Type 2 diabetes mellitus without complications: Secondary | ICD-10-CM

## 2012-07-11 DIAGNOSIS — I10 Essential (primary) hypertension: Secondary | ICD-10-CM

## 2012-07-11 DIAGNOSIS — R7989 Other specified abnormal findings of blood chemistry: Secondary | ICD-10-CM

## 2012-07-11 DIAGNOSIS — E785 Hyperlipidemia, unspecified: Secondary | ICD-10-CM

## 2012-07-11 MED ORDER — SIMVASTATIN 40 MG PO TABS: 40.0000 mg | ORAL_TABLET | Freq: Every day | ORAL | Status: DC

## 2012-07-11 MED ORDER — CARVEDILOL 6.25 MG PO TABS: 6.2500 mg | ORAL_TABLET | Freq: Two times a day (BID) | ORAL | Status: DC

## 2012-07-11 MED ORDER — METFORMIN HCL 500 MG PO TABS: 500.0000 mg | ORAL_TABLET | Freq: Two times a day (BID) | ORAL | Status: DC

## 2012-07-11 NOTE — Assessment & Plan Note (Signed)
Likely secondary to fatty infiltration of the liver. Elevation has been mild and persistent. Elevation is worsened off cholesterol medicine. Discussed with patient today. She will resume cholesterol medication. Encouraged diet low in saturated fat and high in fiber. Plan to repeat liver function test and lipids in one month. Discussed getting right upper quadrant ultrasound, however given ongoing financial issues will hold off for now.

## 2012-07-11 NOTE — Assessment & Plan Note (Signed)
Advised patient to start back on simvastatin. Suspect that elevated LFTs were secondary to fatty liver. Discussed with patient today. We'll plan to repeat lipids and LFTs in one month.

## 2012-07-11 NOTE — Progress Notes (Signed)
Subjective:    Patient ID: Amber Glenn, female    DOB: 1950-05-04, 62 y.o.   MRN: [id number]  HPI 62 year old female with history of diabetes, hyperlipidemia presents for followup of recent labs. She was noted on labs in February 2014 have mildly elevated liver function tests. She was seen by nurse practitioner in our office and told to stop her cholesterol medication. Repeat lipids and LFTs performed this month showed LFTs have slightly increased in lipids are also elevated. She reports she is generally feeling well. She denies any new concerns today.  Outpatient Encounter Prescriptions as of 07/11/2012  Medication Sig Dispense Refill  . Ascorbic Acid (VITAMIN C) 1000 MG tablet Take 1,000 mg by mouth daily.        Marland Kitchen aspirin 81 MG tablet Take 81 mg by mouth daily.        . carvedilol (COREG) 6.25 MG tablet Take 1 tablet (6.25 mg total) by mouth 2 (two) times daily. BRAND NAME ONLY  180 tablet  4  . furosemide (LASIX) 40 MG tablet TAKE 1 TABLET BY MOUTH EVERY DAY  90 tablet  0  . metFORMIN (GLUCOPHAGE) 500 MG tablet Take 1 tablet (500 mg total) by mouth 2 (two) times daily with a meal.  180 tablet  4  . Multiple Vitamin (MULTIVITAMIN) capsule Take 1 capsule by mouth daily.        Marland Kitchen omeprazole (PRILOSEC) 20 MG capsule Take 1 capsule (20 mg total) by mouth daily.  30 capsule  12  . simvastatin (ZOCOR) 40 MG tablet Take 1 tablet (40 mg total) by mouth at bedtime.  90 tablet  3   No facility-administered encounter medications on file as of 07/11/2012.   BP 130/78  Pulse 88  Temp(Src) 97.7 F (36.5 C) (Oral)  Resp 18  Wt 158 lb 12 oz (72.009 kg)  BMI 29.03 kg/m2  SpO2 97%  Review of Systems  Constitutional: Negative for fever, chills, appetite change, fatigue and unexpected weight change.  HENT: Negative for ear pain, congestion, sore throat, trouble swallowing, neck pain, voice change and sinus pressure.   Eyes: Negative for visual disturbance.  Respiratory: Negative for cough, shortness  of breath, wheezing and stridor.   Cardiovascular: Negative for chest pain, palpitations and leg swelling.  Gastrointestinal: Negative for nausea, vomiting, abdominal pain, diarrhea, constipation, blood in stool, abdominal distention and anal bleeding.  Genitourinary: Negative for dysuria and flank pain.  Musculoskeletal: Negative for myalgias, arthralgias and gait problem.  Skin: Negative for color change and rash.  Neurological: Negative for dizziness and headaches.  Hematological: Negative for adenopathy. Does not bruise/bleed easily.  Psychiatric/Behavioral: Negative for suicidal ideas, sleep disturbance and dysphoric mood. The patient is not nervous/anxious.        Objective:   Physical Exam  Constitutional: She is oriented to person, place, and time. She appears well-developed and well-nourished. No distress.  HENT:  Head: Normocephalic and atraumatic.  Right Ear: External ear normal.  Left Ear: External ear normal.  Nose: Nose normal.  Mouth/Throat: Oropharynx is clear and moist. No oropharyngeal exudate.  Eyes: Conjunctivae are normal. Pupils are equal, round, and reactive to light. Right eye exhibits no discharge. Left eye exhibits no discharge. No scleral icterus.  Neck: Normal range of motion. Neck supple. No tracheal deviation present. No thyromegaly present.  Cardiovascular: Normal rate, regular rhythm, normal heart sounds and intact distal pulses.  Exam reveals no gallop and no friction rub.   No murmur heard. Pulmonary/Chest: Effort normal and  breath sounds normal. No respiratory distress. She has no wheezes. She has no rales. She exhibits no tenderness.  Musculoskeletal: Normal range of motion. She exhibits no edema and no tenderness.  Lymphadenopathy:    She has no cervical adenopathy.  Neurological: She is alert and oriented to person, place, and time. No cranial nerve deficit. She exhibits normal muscle tone. Coordination normal.  Skin: Skin is warm and dry. No rash  noted. She is not diaphoretic. No erythema. No pallor.  Psychiatric: She has a normal mood and affect. Her behavior is normal. Judgment and thought content normal.          Assessment & Plan:

## 2012-08-11 ENCOUNTER — Telehealth: Payer: Self-pay | Admitting: Internal Medicine

## 2012-08-11 NOTE — Telephone Encounter (Signed)
Patient informed and will keep appointment of the 21st. Cancelled lab appt on 5/28

## 2012-08-11 NOTE — Telephone Encounter (Signed)
Patient has an appointment on 5/21, does she need to come in sooner or just do labs. Stated she discussed that with them at the GI doctor when she went, she told them she already had an appointment with you coming up.

## 2012-08-11 NOTE — Telephone Encounter (Signed)
Fine to keep May 21st appointment.

## 2012-08-11 NOTE — Telephone Encounter (Signed)
Please make sure pt knows that we need to follow up elevated liver function tests. I received notes from GI physician that the levels continue to be elevated. We need to do additional testing to determine etiology. We sent message and called about this before, but follow up was not scheduled.

## 2012-08-16 LAB — HM COLONOSCOPY

## 2012-08-30 ENCOUNTER — Encounter: Payer: Self-pay | Admitting: Internal Medicine

## 2012-08-30 ENCOUNTER — Ambulatory Visit (INDEPENDENT_AMBULATORY_CARE_PROVIDER_SITE_OTHER): Payer: BC Managed Care – HMO | Admitting: Internal Medicine

## 2012-08-30 VITALS — BP 134/80 | HR 82 | Temp 97.9°F | Wt 158.0 lb

## 2012-08-30 DIAGNOSIS — E119 Type 2 diabetes mellitus without complications: Secondary | ICD-10-CM

## 2012-08-30 DIAGNOSIS — I10 Essential (primary) hypertension: Secondary | ICD-10-CM

## 2012-08-30 DIAGNOSIS — Z1211 Encounter for screening for malignant neoplasm of colon: Secondary | ICD-10-CM

## 2012-08-30 DIAGNOSIS — R7989 Other specified abnormal findings of blood chemistry: Secondary | ICD-10-CM

## 2012-08-30 DIAGNOSIS — K5900 Constipation, unspecified: Secondary | ICD-10-CM | POA: Insufficient documentation

## 2012-08-30 LAB — COMPREHENSIVE METABOLIC PANEL
ALT: 37 U/L — ABNORMAL HIGH (ref 0–35)
AST: 37 U/L (ref 0–37)
Albumin: 3.7 g/dL (ref 3.5–5.2)
Alkaline Phosphatase: 59 U/L (ref 39–117)
BUN: 14 mg/dL (ref 6–23)
CO2: 26 mEq/L (ref 19–32)
Calcium: 8.9 mg/dL (ref 8.4–10.5)
Chloride: 106 mEq/L (ref 96–112)
Creatinine, Ser: 0.8 mg/dL (ref 0.4–1.2)
GFR: 80.33 mL/min (ref >60.00)
Glucose, Bld: 131 mg/dL — ABNORMAL HIGH (ref 70–99)
Potassium: 3.3 mEq/L — ABNORMAL LOW (ref 3.5–5.1)
Sodium: 141 mEq/L (ref 135–145)
Total Bilirubin: 0.3 mg/dL (ref 0.3–1.2)
Total Protein: 7 g/dL (ref 6.0–8.3)

## 2012-08-30 LAB — HEMOGLOBIN A1C: Hgb A1c MFr Bld: 6.4 % (ref 4.6–6.5)

## 2012-08-30 NOTE — Assessment & Plan Note (Signed)
Will repeat LFTs today and include hepatitis serologies. If persistently elevated, discussed getting liver US for evaluation. Follow up 3 months and prn.

## 2012-08-30 NOTE — Assessment & Plan Note (Signed)
BP Readings from Last 3 Encounters:  08/30/12 134/80  07/11/12 130/78  05/29/12 159/76   BP well controlled on current medications. Will continue. Will check renal function with labs today.

## 2012-08-30 NOTE — Progress Notes (Signed)
Subjective:    Patient ID: Amber Glenn, female    DOB: May 08, 1950, 62 y.o.   MRN: [id number]  HPI 62 year old female with history of diabetes, hypertension, hyperlipidemia presents for followup. She recently underwent her colonoscopy on 08/16/2012. Since that time, she reports difficulty passing stools, abdominal distention, and constipation. She denies abdominal pain. She denies fever or chills. She denies rectal bleeding. She has not followed up with her GI physician. She has been taking over-the-counter Dulcolax and using suppositories to help with bowel movements. She has some improvement with this.  In regards to diabetes, she reports most blood sugars near 120. She is compliant with metformin.  Outpatient Encounter Prescriptions as of 08/30/2012  Medication Sig Dispense Refill  . Ascorbic Acid (VITAMIN C) 1000 MG tablet Take 1,000 mg by mouth daily.        Marland Kitchen aspirin 81 MG tablet Take 81 mg by mouth daily.        . carvedilol (COREG) 6.25 MG tablet Take 1 tablet (6.25 mg total) by mouth 2 (two) times daily. BRAND NAME ONLY  180 tablet  4  . fish oil-omega-3 fatty acids 1000 MG capsule Take 2 g by mouth daily.      . furosemide (LASIX) 40 MG tablet TAKE 1 TABLET BY MOUTH EVERY DAY  90 tablet  0  . metFORMIN (GLUCOPHAGE) 500 MG tablet Take 1 tablet (500 mg total) by mouth 2 (two) times daily with a meal.  180 tablet  4  . Multiple Vitamin (MULTIVITAMIN) capsule Take 1 capsule by mouth daily.        Marland Kitchen omeprazole (PRILOSEC) 20 MG capsule Take 1 capsule (20 mg total) by mouth daily.  30 capsule  12  . simvastatin (ZOCOR) 40 MG tablet Take 1 tablet (40 mg total) by mouth at bedtime.  90 tablet  3   No facility-administered encounter medications on file as of 08/30/2012.   BP 134/80  Pulse 82  Temp(Src) 97.9 F (36.6 C) (Oral)  Wt 158 lb (71.668 kg)  BMI 28.89 kg/m2  SpO2 97%  Review of Systems  Constitutional: Negative for fever, chills, appetite change, fatigue and unexpected weight  change.  HENT: Negative for ear pain, congestion, sore throat, trouble swallowing, neck pain, voice change and sinus pressure.   Eyes: Negative for visual disturbance.  Respiratory: Negative for cough, shortness of breath, wheezing and stridor.   Cardiovascular: Negative for chest pain, palpitations and leg swelling.  Gastrointestinal: Negative for nausea, vomiting, abdominal pain, diarrhea, constipation, blood in stool, abdominal distention and anal bleeding.  Genitourinary: Negative for dysuria and flank pain.  Musculoskeletal: Negative for myalgias, arthralgias and gait problem.  Skin: Negative for color change and rash.  Neurological: Negative for dizziness and headaches.  Hematological: Negative for adenopathy. Does not bruise/bleed easily.  Psychiatric/Behavioral: Negative for suicidal ideas, sleep disturbance and dysphoric mood. The patient is not nervous/anxious.        Objective:   Physical Exam  Constitutional: She is oriented to person, place, and time. She appears well-developed and well-nourished. No distress.  HENT:  Head: Normocephalic and atraumatic.  Right Ear: External ear normal.  Left Ear: External ear normal.  Nose: Nose normal.  Mouth/Throat: Oropharynx is clear and moist. No oropharyngeal exudate.  Eyes: Conjunctivae are normal. Pupils are equal, round, and reactive to light. Right eye exhibits no discharge. Left eye exhibits no discharge. No scleral icterus.  Neck: Normal range of motion. Neck supple. No tracheal deviation present. No thyromegaly present.  Cardiovascular: Normal  rate, regular rhythm, normal heart sounds and intact distal pulses.  Exam reveals no gallop and no friction rub.   No murmur heard. Pulmonary/Chest: Effort normal and breath sounds normal. No accessory muscle usage. Not tachypneic. No respiratory distress. She has no decreased breath sounds. She has no wheezes. She has no rhonchi. She has no rales. She exhibits no tenderness.  Abdominal:  Soft. Bowel sounds are normal. She exhibits distension (slight). She exhibits no mass. There is no tenderness. There is no rebound and no guarding.  Musculoskeletal: Normal range of motion. She exhibits no edema and no tenderness.  Lymphadenopathy:    She has no cervical adenopathy.  Neurological: She is alert and oriented to person, place, and time. No cranial nerve deficit. She exhibits normal muscle tone. Coordination normal.  Skin: Skin is warm and dry. No rash noted. She is not diaphoretic. No erythema. No pallor.  Psychiatric: She has a normal mood and affect. Her behavior is normal. Judgment and thought content normal.          Assessment & Plan:

## 2012-08-30 NOTE — Assessment & Plan Note (Signed)
Persistent symptoms of constipation and difficulty passing stool without use of suppository since colonoscopy 08/16/2012.  Will have pt follow up with Dr. Markham Jordan. Question if she may have stricture or inflammation in the colon after colonoscopy leading to symptoms.

## 2012-08-30 NOTE — Assessment & Plan Note (Signed)
BG well controlled per pt report. Will check A1c with labs today. Continue metformin.

## 2012-08-31 LAB — HEPATITIS B SURFACE ANTIGEN: Hepatitis B Surface Ag: NEGATIVE

## 2012-08-31 LAB — HEPATITIS B SURFACE ANTIBODY,QUALITATIVE: Hep B S Ab: NONREACTIVE

## 2012-08-31 LAB — HEPATITIS C ANTIBODY: HCV Ab: NEGATIVE

## 2012-09-06 ENCOUNTER — Other Ambulatory Visit: Payer: BC Managed Care – HMO

## 2012-09-25 ENCOUNTER — Other Ambulatory Visit: Payer: Self-pay | Admitting: Internal Medicine

## 2012-09-25 MED ORDER — FUROSEMIDE 40 MG PO TABS: ORAL_TABLET | ORAL | Status: DC

## 2012-09-25 NOTE — Telephone Encounter (Signed)
Rx sent to pharmacy on file.

## 2012-11-10 LAB — HM DIABETES EYE EXAM

## 2012-11-13 LAB — HM DIABETES FOOT EXAM

## 2012-12-01 ENCOUNTER — Ambulatory Visit (INDEPENDENT_AMBULATORY_CARE_PROVIDER_SITE_OTHER): Payer: BC Managed Care – HMO | Admitting: Internal Medicine

## 2012-12-01 ENCOUNTER — Encounter: Payer: Self-pay | Admitting: Internal Medicine

## 2012-12-01 VITALS — BP 150/82 | HR 88 | Temp 97.8°F | Wt 155.0 lb

## 2012-12-01 DIAGNOSIS — E785 Hyperlipidemia, unspecified: Secondary | ICD-10-CM

## 2012-12-01 DIAGNOSIS — E119 Type 2 diabetes mellitus without complications: Secondary | ICD-10-CM

## 2012-12-01 DIAGNOSIS — Z23 Encounter for immunization: Secondary | ICD-10-CM

## 2012-12-01 DIAGNOSIS — I10 Essential (primary) hypertension: Secondary | ICD-10-CM

## 2012-12-01 LAB — COMPREHENSIVE METABOLIC PANEL
ALT: 35 U/L (ref 0–35)
AST: 30 U/L (ref 0–37)
Albumin: 4.1 g/dL (ref 3.5–5.2)
Alkaline Phosphatase: 67 U/L (ref 39–117)
BUN: 11 mg/dL (ref 6–23)
CO2: 31 mEq/L (ref 19–32)
Calcium: 9.6 mg/dL (ref 8.4–10.5)
Chloride: 101 mEq/L (ref 96–112)
Creatinine, Ser: 0.8 mg/dL (ref 0.4–1.2)
GFR: 79.07 mL/min (ref >60.00)
Glucose, Bld: 121 mg/dL — ABNORMAL HIGH (ref 70–99)
Potassium: 3.9 mEq/L (ref 3.5–5.1)
Sodium: 138 mEq/L (ref 135–145)
Total Bilirubin: 0.6 mg/dL (ref 0.3–1.2)
Total Protein: 7.4 g/dL (ref 6.0–8.3)

## 2012-12-01 LAB — MICROALBUMIN / CREATININE URINE RATIO
Creatinine,U: 51.9 mg/dL
Microalb Creat Ratio: 0.8 mg/g (ref 0.0–30.0)
Microalb, Ur: 0.4 mg/dL (ref 0.0–1.9)

## 2012-12-01 LAB — LIPID PANEL
Cholesterol: 145 mg/dL (ref 0–200)
HDL: 39.9 mg/dL (ref >39.00)
LDL Cholesterol: 80 mg/dL (ref 0–99)
Total CHOL/HDL Ratio: 4
Triglycerides: 127 mg/dL (ref 0.0–149.0)
VLDL: 25.4 mg/dL (ref 0.0–40.0)

## 2012-12-01 LAB — HEMOGLOBIN A1C: Hgb A1c MFr Bld: 6.2 % (ref 4.6–6.5)

## 2012-12-01 NOTE — Assessment & Plan Note (Addendum)
BP Readings from Last 3 Encounters:  12/01/12 150/82  08/30/12 134/80  07/11/12 130/78   Blood pressure slightly elevated today however patient did not take medication this morning. In general blood pressures have been well-controlled. Will monitor closely. Patient will call if blood pressure greater than 140/90 consistently. Note that she had issues of palpitations in the past which is why she is on a beta blocker. Her BP was felt to be too low on betablocker to tolerate beta blocker and ACE inhibitor together. However, if BP continues to be elevated, will try low dose ACEi.

## 2012-12-01 NOTE — Progress Notes (Signed)
Subjective:    Patient ID: Amber Glenn, female    DOB: 10/13/1950, 62 y.o.   MRN: [id number]  HPI 62 year old female with history of diabetes, hypertension, hyperlipidemia presents for followup. She reports she has been doing well. Most blood sugars have been near 120-150. No blood sugars less than 70 or greater than 250. She is compliant with medication. She has no new concerns today. She does report that she is scheduled for bunionectomy in the coming weeks.  Outpatient Encounter Prescriptions as of 12/01/2012  Medication Sig Dispense Refill  . Ascorbic Acid (VITAMIN C) 1000 MG tablet Take 1,000 mg by mouth daily.        Marland Kitchen aspirin 81 MG tablet Take 81 mg by mouth daily.        . carvedilol (COREG) 6.25 MG tablet Take 1 tablet (6.25 mg total) by mouth 2 (two) times daily. BRAND NAME ONLY  180 tablet  4  . furosemide (LASIX) 40 MG tablet TAKE 1 TABLET BY MOUTH EVERY DAY  90 tablet  0  . metFORMIN (GLUCOPHAGE) 500 MG tablet Take 1 tablet (500 mg total) by mouth 2 (two) times daily with a meal.  180 tablet  4  . Multiple Vitamin (MULTIVITAMIN) capsule Take 1 capsule by mouth daily.        Marland Kitchen omeprazole (PRILOSEC) 20 MG capsule Take 1 capsule (20 mg total) by mouth daily.  30 capsule  12  . simvastatin (ZOCOR) 40 MG tablet Take 1 tablet (40 mg total) by mouth at bedtime.  90 tablet  3  . fish oil-omega-3 fatty acids 1000 MG capsule Take 2 g by mouth daily.       No facility-administered encounter medications on file as of 12/01/2012.   BP 150/82  Pulse 88  Temp(Src) 97.8 F (36.6 C) (Oral)  Wt 155 lb (70.308 kg)  BMI 28.34 kg/m2  SpO2 94%  Review of Systems  Constitutional: Negative for fever, chills, appetite change, fatigue and unexpected weight change.  HENT: Negative for ear pain, congestion, sore throat, trouble swallowing, neck pain, voice change and sinus pressure.   Eyes: Negative for visual disturbance.  Respiratory: Negative for cough, shortness of breath, wheezing and  stridor.   Cardiovascular: Negative for chest pain, palpitations and leg swelling.  Gastrointestinal: Negative for nausea, vomiting, abdominal pain, diarrhea, constipation, blood in stool, abdominal distention and anal bleeding.  Genitourinary: Negative for dysuria and flank pain.  Musculoskeletal: Negative for myalgias, arthralgias and gait problem.  Skin: Negative for color change and rash.  Neurological: Negative for dizziness and headaches.  Hematological: Negative for adenopathy. Does not bruise/bleed easily.  Psychiatric/Behavioral: Negative for suicidal ideas, sleep disturbance and dysphoric mood. The patient is not nervous/anxious.        Objective:   Physical Exam  Constitutional: She is oriented to person, place, and time. She appears well-developed and well-nourished. No distress.  HENT:  Head: Normocephalic and atraumatic.  Right Ear: External ear normal.  Left Ear: External ear normal.  Nose: Nose normal.  Mouth/Throat: Oropharynx is clear and moist. No oropharyngeal exudate.  Eyes: Conjunctivae are normal. Pupils are equal, round, and reactive to light. Right eye exhibits no discharge. Left eye exhibits no discharge. No scleral icterus.  Neck: Normal range of motion. Neck supple. No tracheal deviation present. No thyromegaly present.  Cardiovascular: Normal rate, regular rhythm, normal heart sounds and intact distal pulses.  Exam reveals no gallop and no friction rub.   No murmur heard. Pulmonary/Chest: Effort normal and breath sounds  normal. No accessory muscle usage. Not tachypneic. No respiratory distress. She has no decreased breath sounds. She has no wheezes. She has no rhonchi. She has no rales. She exhibits no tenderness.  Musculoskeletal: Normal range of motion. She exhibits no edema and no tenderness.  Lymphadenopathy:    She has no cervical adenopathy.  Neurological: She is alert and oriented to person, place, and time. No cranial nerve deficit. She exhibits  normal muscle tone. Coordination normal.  Skin: Skin is warm and dry. No rash noted. She is not diaphoretic. No erythema. No pallor.  Psychiatric: She has a normal mood and affect. Her behavior is normal. Judgment and thought content normal.          Assessment & Plan:

## 2012-12-01 NOTE — Assessment & Plan Note (Signed)
Blood sugars. Well-controlled on metformin. Encouraged patient to continue efforts at healthy diet and regular physical activity. Foot exam normal today except for noted bunions which are scheduled to be surgically repaired later this month. Pneumovax given today.

## 2012-12-01 NOTE — Assessment & Plan Note (Signed)
Lipids well-controlled on current medication. Continue simvastatin.

## 2012-12-13 ENCOUNTER — Ambulatory Visit: Payer: Self-pay | Admitting: Podiatry

## 2012-12-19 ENCOUNTER — Encounter: Payer: Self-pay | Admitting: Unknown Physician Specialty

## 2012-12-22 ENCOUNTER — Telehealth: Payer: Self-pay | Admitting: Internal Medicine

## 2012-12-22 ENCOUNTER — Ambulatory Visit (INDEPENDENT_AMBULATORY_CARE_PROVIDER_SITE_OTHER): Payer: BC Managed Care – HMO | Admitting: Internal Medicine

## 2012-12-22 ENCOUNTER — Encounter: Payer: Self-pay | Admitting: Internal Medicine

## 2012-12-22 VITALS — BP 146/80 | HR 96 | Temp 98.1°F | Wt 153.0 lb

## 2012-12-22 DIAGNOSIS — K5900 Constipation, unspecified: Secondary | ICD-10-CM

## 2012-12-22 DIAGNOSIS — R1011 Right upper quadrant pain: Secondary | ICD-10-CM

## 2012-12-22 MED ORDER — LUBIPROSTONE 24 MCG PO CAPS: 24.0000 ug | ORAL_CAPSULE | Freq: Every day | ORAL | Status: DC

## 2012-12-22 NOTE — Telephone Encounter (Signed)
States her pharmacy told her it would be 72 hours before they could fill amitiza due to needing authorization.  Pt asking if we can just call something else in because she cannot wait 72 hours for a medication.

## 2012-12-22 NOTE — Telephone Encounter (Signed)
Prescription sent to the pharmacy and patient is aware.

## 2012-12-22 NOTE — Telephone Encounter (Signed)
We can try Lactulose 20gm/80ml, take 15ml po every 2hr, up to three doses for bowel movement, disp 90ml

## 2012-12-22 NOTE — Telephone Encounter (Signed)
Please advise what to do, patient really wants something today.

## 2012-12-22 NOTE — Assessment & Plan Note (Signed)
Chronic issues with constipation ever since colonoscopy 08/2012. Recently made worse with use of narcotic pain medication after surgery. No improvement with OTC meds. Will start Amitiza daily. Pt will call if symptoms are not improving over next 24-48hr. If no improvement, then would favor further evaluation with CT abd/pel.

## 2012-12-22 NOTE — Assessment & Plan Note (Signed)
Several days of diffuse right sided abdominal pain associated with constipation. No improvement with use of OTC laxatives such as Ducolax, Senna-S, Miralax. Has had issues with constipation ever since colonoscopy in 08/2012. Suspect this is leading to gaseous distension and pain. Will start Amitiza. Pt will call if symptoms are not improving over the next 24-48hr, or if symptoms worsen. If no improvement, we discussed getting a CT for further evaluation.

## 2012-12-22 NOTE — Progress Notes (Signed)
Subjective:    Patient ID: Amber Glenn, female    DOB: 04-11-1951, 62 y.o.   MRN: [id number]  HPI 62YO female with h/o chronic constipation after colonoscopy in 08/2012 presents for acute visit complaining of several days of right sided abdominal pain and constipation. Notably, she recent underwent ankle surgery and has been taking prescription narcotics for pain. She noted increased constipation and right sided abdominal pain. She took OTC Senna, Ducolax with no improvement, however today, she did have a small amount of firm stool. She denies nausea, vomiting, change in appetite, fever, chills. She is otherwise feeling well.  Outpatient Encounter Prescriptions as of 12/22/2012  Medication Sig Dispense Refill  . Ascorbic Acid (VITAMIN C) 1000 MG tablet Take 1,000 mg by mouth daily.        Marland Kitchen aspirin 81 MG tablet Take 81 mg by mouth daily.        . carvedilol (COREG) 6.25 MG tablet Take 1 tablet (6.25 mg total) by mouth 2 (two) times daily. BRAND NAME ONLY  180 tablet  4  . furosemide (LASIX) 40 MG tablet TAKE 1 TABLET BY MOUTH EVERY DAY  90 tablet  0  . metFORMIN (GLUCOPHAGE) 500 MG tablet Take 1 tablet (500 mg total) by mouth 2 (two) times daily with a meal.  180 tablet  4  . Multiple Vitamin (MULTIVITAMIN) capsule Take 1 capsule by mouth daily.        Marland Kitchen omeprazole (PRILOSEC) 20 MG capsule Take 1 capsule (20 mg total) by mouth daily.  30 capsule  12  . simvastatin (ZOCOR) 40 MG tablet Take 1 tablet (40 mg total) by mouth at bedtime.  90 tablet  3  . fish oil-omega-3 fatty acids 1000 MG capsule Take 2 g by mouth daily.       No facility-administered encounter medications on file as of 12/22/2012.   BP 146/80  Pulse 96  Temp(Src) 98.1 F (36.7 C) (Oral)  Wt 153 lb (69.4 kg)  BMI 27.98 kg/m2  Review of Systems  Constitutional: Negative for fever, chills, appetite change, fatigue and unexpected weight change.  HENT: Negative for ear pain, congestion, sore throat, trouble swallowing, neck  pain, voice change and sinus pressure.   Eyes: Negative for visual disturbance.  Respiratory: Negative for cough, shortness of breath, wheezing and stridor.   Cardiovascular: Negative for chest pain, palpitations and leg swelling.  Gastrointestinal: Positive for abdominal pain and constipation. Negative for nausea, vomiting, diarrhea, blood in stool, abdominal distention and anal bleeding.  Genitourinary: Negative for dysuria and flank pain.  Musculoskeletal: Negative for myalgias, arthralgias and gait problem.  Skin: Negative for color change and rash.  Neurological: Negative for dizziness and headaches.  Hematological: Negative for adenopathy. Does not bruise/bleed easily.  Psychiatric/Behavioral: Negative for suicidal ideas, sleep disturbance and dysphoric mood. The patient is not nervous/anxious.        Objective:   Physical Exam  Constitutional: She is oriented to person, place, and time. She appears well-developed and well-nourished. No distress.  HENT:  Head: Normocephalic and atraumatic.  Right Ear: External ear normal.  Left Ear: External ear normal.  Nose: Nose normal.  Mouth/Throat: Oropharynx is clear and moist. No oropharyngeal exudate.  Eyes: Conjunctivae are normal. Pupils are equal, round, and reactive to light. Right eye exhibits no discharge. Left eye exhibits no discharge. No scleral icterus.  Neck: Normal range of motion. Neck supple. No tracheal deviation present. No thyromegaly present.  Cardiovascular: Normal rate, regular rhythm, normal heart sounds and intact  distal pulses.  Exam reveals no gallop and no friction rub.   No murmur heard. Pulmonary/Chest: Effort normal and breath sounds normal. No accessory muscle usage. Not tachypneic. No respiratory distress. She has no decreased breath sounds. She has no wheezes. She has no rhonchi. She has no rales. She exhibits no tenderness.  Abdominal: Soft. Bowel sounds are normal. She exhibits distension (slight diffuse).  She exhibits no mass. There is tenderness (mild right-sided). There is no rebound and no guarding.  Musculoskeletal: Normal range of motion. She exhibits no edema and no tenderness.  Lymphadenopathy:    She has no cervical adenopathy.  Neurological: She is alert and oriented to person, place, and time. No cranial nerve deficit. She exhibits normal muscle tone. Coordination normal.  Skin: Skin is warm and dry. No rash noted. She is not diaphoretic. No erythema. No pallor.  Psychiatric: She has a normal mood and affect. Her behavior is normal. Judgment and thought content normal.          Assessment & Plan:

## 2012-12-26 ENCOUNTER — Telehealth: Payer: Self-pay | Admitting: *Deleted

## 2012-12-26 NOTE — Telephone Encounter (Signed)
FYI....... Patient left message on voicemail stating she is feeling somewhat better but still knotting up some. If not better by the time for her appointment then she will come in for that visit but if she is

## 2012-12-27 ENCOUNTER — Telehealth: Payer: Self-pay | Admitting: *Deleted

## 2012-12-27 NOTE — Telephone Encounter (Signed)
Is she referring to the CT scan of the abdomen? If yes, generally insurance will cover some or all of this testing. She will have to look at her policy.

## 2012-12-27 NOTE — Telephone Encounter (Signed)
Informed patient this would be ordered once she see Dr. Dan Humphreys on Friday and we will have more information to give her then.

## 2012-12-27 NOTE — Telephone Encounter (Signed)
Patient would like to know if the testing she is to have done, where will she have to go for them and will her insurance pay for it?

## 2012-12-29 ENCOUNTER — Encounter: Payer: Self-pay | Admitting: Internal Medicine

## 2012-12-29 ENCOUNTER — Ambulatory Visit (INDEPENDENT_AMBULATORY_CARE_PROVIDER_SITE_OTHER): Payer: BC Managed Care – HMO | Admitting: Internal Medicine

## 2012-12-29 VITALS — BP 144/72 | HR 96 | Temp 98.1°F

## 2012-12-29 DIAGNOSIS — R1011 Right upper quadrant pain: Secondary | ICD-10-CM

## 2012-12-29 NOTE — Progress Notes (Signed)
Subjective:    Patient ID: Amber Glenn, female    DOB: [birth year], 62 y.o.   MRN: [id number]  HPI 62YO female presents for follow up after recent issues with right sided abdominal pain and constipation. At last visit, suspicion was for constipation leading to pain. Attempted to use Amitiza, however insurance would not cover. Pt was given course of Lactulose and had some resulting liquid stool yesterday. She continues to have intermittent right sided abdominal pain. Described as cramping or aching. Not taking any medication for this. No nausea, vomiting, diarrhea, fever, chills, urinary symptoms.  Outpatient Encounter Prescriptions as of 12/29/2012  Medication Sig Dispense Refill  . Ascorbic Acid (VITAMIN C) 1000 MG tablet Take 1,000 mg by mouth daily.        Marland Kitchen aspirin 81 MG tablet Take 81 mg by mouth daily.        . carvedilol (COREG) 6.25 MG tablet Take 1 tablet (6.25 mg total) by mouth 2 (two) times daily. BRAND NAME ONLY  180 tablet  4  . fish oil-omega-3 fatty acids 1000 MG capsule Take 2 g by mouth daily.      . furosemide (LASIX) 40 MG tablet TAKE 1 TABLET BY MOUTH EVERY DAY  90 tablet  0  . metFORMIN (GLUCOPHAGE) 500 MG tablet Take 1 tablet (500 mg total) by mouth 2 (two) times daily with a meal.  180 tablet  4  . Multiple Vitamin (MULTIVITAMIN) capsule Take 1 capsule by mouth daily.        Marland Kitchen omeprazole (PRILOSEC) 20 MG capsule Take 1 capsule (20 mg total) by mouth daily.  30 capsule  12  . simvastatin (ZOCOR) 40 MG tablet Take 1 tablet (40 mg total) by mouth at bedtime.  90 tablet  3  . [DISCONTINUED] lubiprostone (AMITIZA) 24 MCG capsule Take 1 capsule (24 mcg total) by mouth daily with breakfast.  30 capsule  2   No facility-administered encounter medications on file as of 12/29/2012.   BP 144/72  Pulse 96  Temp(Src) 98.1 F (36.7 C) (Oral)  SpO2 93%  Review of Systems  Constitutional: Negative for fever, chills, appetite change, fatigue and unexpected weight change.  HENT:  Negative for ear pain, congestion, sore throat, trouble swallowing, neck pain, voice change and sinus pressure.   Eyes: Negative for visual disturbance.  Respiratory: Negative for cough, shortness of breath, wheezing and stridor.   Cardiovascular: Negative for chest pain, palpitations and leg swelling.  Gastrointestinal: Positive for abdominal pain (right sided) and constipation. Negative for nausea, vomiting, diarrhea, blood in stool, abdominal distention and anal bleeding.  Genitourinary: Negative for dysuria and flank pain.  Musculoskeletal: Negative for myalgias, arthralgias and gait problem.  Skin: Negative for color change and rash.  Neurological: Negative for dizziness and headaches.  Hematological: Negative for adenopathy. Does not bruise/bleed easily.  Psychiatric/Behavioral: Negative for suicidal ideas, sleep disturbance and dysphoric mood. The patient is not nervous/anxious.        Objective:   Physical Exam  Constitutional: She is oriented to person, place, and time. She appears well-developed and well-nourished. No distress.  HENT:  Head: Normocephalic and atraumatic.  Right Ear: External ear normal.  Left Ear: External ear normal.  Nose: Nose normal.  Mouth/Throat: Oropharynx is clear and moist. No oropharyngeal exudate.  Eyes: Conjunctivae are normal. Pupils are equal, round, and reactive to light. Right eye exhibits no discharge. Left eye exhibits no discharge. No scleral icterus.  Neck: Normal range of motion. Neck supple. No tracheal deviation present.  No thyromegaly present.  Cardiovascular: Normal rate, regular rhythm, normal heart sounds and intact distal pulses.  Exam reveals no gallop and no friction rub.   No murmur heard. Pulmonary/Chest: Effort normal and breath sounds normal. No accessory muscle usage. Not tachypneic. No respiratory distress. She has no decreased breath sounds. She has no wheezes. She has no rhonchi. She has no rales. She exhibits no tenderness.   Abdominal: Soft. Bowel sounds are normal. She exhibits no distension and no mass. There is tenderness (right middle abdomen). There is no rebound and no guarding.  Musculoskeletal: Normal range of motion. She exhibits no edema and no tenderness.  Lymphadenopathy:    She has no cervical adenopathy.  Neurological: She is alert and oriented to person, place, and time. No cranial nerve deficit. She exhibits normal muscle tone. Coordination normal.  Skin: Skin is warm and dry. No rash noted. She is not diaphoretic. No erythema. No pallor.  Psychiatric: She has a normal mood and affect. Her behavior is normal. Judgment and thought content normal.          Assessment & Plan:

## 2012-12-29 NOTE — Assessment & Plan Note (Signed)
Persistent right sided abdominal pain. Exam remarkable for tenderness to palpation but no palpable mass. Question if she may have scar tissue from previous appendectomy leading to narrowing of the bowel and pain and constipation. Will get CT abdomen for further evaluation.

## 2013-01-01 ENCOUNTER — Other Ambulatory Visit: Payer: Self-pay | Admitting: Internal Medicine

## 2013-01-01 NOTE — Telephone Encounter (Signed)
Eprescribed.

## 2013-01-02 ENCOUNTER — Telehealth: Payer: Self-pay | Admitting: Internal Medicine

## 2013-01-02 ENCOUNTER — Ambulatory Visit: Payer: Self-pay | Admitting: Internal Medicine

## 2013-01-02 DIAGNOSIS — R935 Abnormal findings on diagnostic imaging of other abdominal regions, including retroperitoneum: Secondary | ICD-10-CM

## 2013-01-02 DIAGNOSIS — I701 Atherosclerosis of renal artery: Secondary | ICD-10-CM

## 2013-01-02 NOTE — Telephone Encounter (Signed)
Recent CT of the abdomen showed thickened loops of small bowel which may represent inflammation or infection. Given that pt has no recent symptoms of fever, chills, diarrhea, I am less concerned about infection. I would like to set up a visit with GI to see if any additional testing such as endoscopy might be helpful. Please see if pt has a preferred provider. 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 I would like to set up a renal ultrasound with [facility] to look at this.

## 2013-01-03 NOTE — Telephone Encounter (Signed)
Patient aware.

## 2013-01-03 NOTE — Telephone Encounter (Signed)
Patient informed and verbally agreed understanding. Does not have a GI preference and will someone set up her renal ultrasound as well?

## 2013-01-03 NOTE — Telephone Encounter (Signed)
Yes the [place] will be at [facility].

## 2013-01-11 ENCOUNTER — Encounter: Payer: Self-pay | Admitting: Internal Medicine

## 2013-02-15 ENCOUNTER — Other Ambulatory Visit: Payer: Self-pay

## 2013-03-07 ENCOUNTER — Ambulatory Visit: Payer: BC Managed Care – HMO | Admitting: Internal Medicine

## 2013-03-12 ENCOUNTER — Encounter: Payer: Self-pay | Admitting: Internal Medicine

## 2013-03-12 ENCOUNTER — Ambulatory Visit (INDEPENDENT_AMBULATORY_CARE_PROVIDER_SITE_OTHER): Payer: BC Managed Care – HMO | Admitting: Internal Medicine

## 2013-03-12 VITALS — BP 138/66 | HR 84 | Temp 97.1°F | Wt 158.0 lb

## 2013-03-12 DIAGNOSIS — E119 Type 2 diabetes mellitus without complications: Secondary | ICD-10-CM

## 2013-03-12 DIAGNOSIS — I10 Essential (primary) hypertension: Secondary | ICD-10-CM

## 2013-03-12 DIAGNOSIS — Z1239 Encounter for other screening for malignant neoplasm of breast: Secondary | ICD-10-CM

## 2013-03-12 LAB — MICROALBUMIN / CREATININE URINE RATIO
Creatinine,U: 8.6 mg/dL
Microalb Creat Ratio: 18.5 mg/g (ref 0.0–30.0)
Microalb, Ur: 1.6 mg/dL (ref 0.0–1.9)

## 2013-03-12 LAB — COMPREHENSIVE METABOLIC PANEL
ALT: 36 U/L — ABNORMAL HIGH (ref 0–35)
AST: 32 U/L (ref 0–37)
Albumin: 4 g/dL (ref 3.5–5.2)
Alkaline Phosphatase: 66 U/L (ref 39–117)
BUN: 21 mg/dL (ref 6–23)
CO2: 30 mEq/L (ref 19–32)
Calcium: 9.6 mg/dL (ref 8.4–10.5)
Chloride: 101 mEq/L (ref 96–112)
Creatinine, Ser: 0.8 mg/dL (ref 0.4–1.2)
GFR: 81.41 mL/min (ref >60.00)
Glucose, Bld: 156 mg/dL — ABNORMAL HIGH (ref 70–99)
Potassium: 3.8 mEq/L (ref 3.5–5.1)
Sodium: 139 mEq/L (ref 135–145)
Total Bilirubin: 0.3 mg/dL (ref 0.3–1.2)
Total Protein: 7.3 g/dL (ref 6.0–8.3)

## 2013-03-12 LAB — HEMOGLOBIN A1C: Hgb A1c MFr Bld: 6.3 % (ref 4.6–6.5)

## 2013-03-12 NOTE — Assessment & Plan Note (Addendum)
BP Readings from Last 3 Encounters:  03/12/13 138/66  12/29/12 144/72  12/22/12 146/80   BP well controlled on carvedilol. As previously noted, pt had palpitations in the past and was placed on BB. Not on ACEi because of hypotension previously. Will continue Carvedilol for now. Consider low dose ACEi next visit.

## 2013-03-12 NOTE — Assessment & Plan Note (Signed)
Lab Results  Component Value Date   HGBA1C 6.3 03/12/2013   Excellent control of BG on metformin alone. Will continue. Follow up 3-6 months and prn.

## 2013-03-12 NOTE — Progress Notes (Signed)
Subjective:    Patient ID: Amber Glenn, female    DOB: [birth year], 62 y.o.   MRN: [id number]  HPI 62 year old female with history of diabetes, hypertension presents for followup. She reports that she has generally been feeling well. She did not bring record of her blood sugars today. She is compliant with medications. No concerns today.  Outpatient Encounter Prescriptions as of 03/12/2013  Medication Sig  . Ascorbic Acid (VITAMIN C) 1000 MG tablet Take 1,000 mg by mouth daily.    Marland Kitchen aspirin 81 MG tablet Take 81 mg by mouth daily.    . carvedilol (COREG) 6.25 MG tablet Take 1 tablet (6.25 mg total) by mouth 2 (two) times daily. BRAND NAME ONLY  . furosemide (LASIX) 40 MG tablet TAKE ONE TABLET BY MOUTH ONCE DAILY  . metFORMIN (GLUCOPHAGE) 500 MG tablet Take 1 tablet (500 mg total) by mouth 2 (two) times daily with a meal.  . Multiple Vitamin (MULTIVITAMIN) capsule Take 1 capsule by mouth daily.    Marland Kitchen omeprazole (PRILOSEC) 20 MG capsule Take 1 capsule (20 mg total) by mouth daily.  . prednisoLONE acetate (PRED MILD) 0.12 % ophthalmic suspension Place 1 drop into the right eye daily.  . simvastatin (ZOCOR) 40 MG tablet Take 1 tablet (40 mg total) by mouth at bedtime.   BP 138/66  Pulse 84  Temp(Src) 97.1 F (36.2 C) (Oral)  Wt 158 lb (71.668 kg)  SpO2 96%  Review of Systems  Constitutional: Negative for fever, chills, appetite change, fatigue and unexpected weight change.  HENT: Negative for congestion, ear pain, sinus pressure, sore throat, trouble swallowing and voice change.   Eyes: Negative for visual disturbance.  Respiratory: Negative for cough, shortness of breath, wheezing and stridor.   Cardiovascular: Negative for chest pain, palpitations and leg swelling.  Gastrointestinal: Negative for nausea, vomiting, abdominal pain, diarrhea, constipation, blood in stool, abdominal distention and anal bleeding.  Genitourinary: Negative for dysuria and flank pain.  Musculoskeletal:  Negative for arthralgias, gait problem, myalgias and neck pain.  Skin: Negative for color change and rash.  Neurological: Negative for dizziness and headaches.  Hematological: Negative for adenopathy. Does not bruise/bleed easily.  Psychiatric/Behavioral: Negative for suicidal ideas, sleep disturbance and dysphoric mood. The patient is not nervous/anxious.        Objective:   Physical Exam  Constitutional: She is oriented to person, place, and time. She appears well-developed and well-nourished. No distress.  HENT:  Head: Normocephalic and atraumatic.  Right Ear: External ear normal.  Left Ear: External ear normal.  Nose: Nose normal.  Mouth/Throat: Oropharynx is clear and moist. No oropharyngeal exudate.  Eyes: Conjunctivae are normal. Pupils are equal, round, and reactive to light. Right eye exhibits no discharge. Left eye exhibits no discharge. No scleral icterus.  Neck: Normal range of motion. Neck supple. No tracheal deviation present. No thyromegaly present.  Cardiovascular: Normal rate, regular rhythm, normal heart sounds and intact distal pulses.  Exam reveals no gallop and no friction rub.   No murmur heard. Pulmonary/Chest: Effort normal and breath sounds normal. No accessory muscle usage. Not tachypneic. No respiratory distress. She has no decreased breath sounds. She has no wheezes. She has no rhonchi. She has no rales. She exhibits no tenderness.  Musculoskeletal: Normal range of motion. She exhibits no edema and no tenderness.  Lymphadenopathy:    She has no cervical adenopathy.  Neurological: She is alert and oriented to person, place, and time. No cranial nerve deficit. She exhibits normal muscle tone.  Coordination normal.  Skin: Skin is warm and dry. No rash noted. She is not diaphoretic. No erythema. No pallor.  Psychiatric: She has a normal mood and affect. Her behavior is normal. Judgment and thought content normal.          Assessment & Plan:

## 2013-03-12 NOTE — Progress Notes (Signed)
Pre-visit discussion using our clinic review tool. No additional management support is needed unless otherwise documented below in the visit note.  

## 2013-03-13 ENCOUNTER — Telehealth: Payer: Self-pay | Admitting: Internal Medicine

## 2013-03-13 NOTE — Telephone Encounter (Signed)
Please advise 

## 2013-03-13 NOTE — Telephone Encounter (Signed)
Patient Information:  Caller Name: Karey  Phone: [phone]  Patient: Zaidy, Absher  Gender: Female  DOB: 07/18/1950  Age: 62 Years  PCP: Ronna Polio (Adults only)  Office Follow Up:  Does the office need to follow up with this patient?: Yes  Instructions For The Office: She questions when she needs to make a return appointment based on her labs.    RN Note:  Pt ? what kind of OTC meds she can take for her cough after an OV on 03/12/13. Educated.   Symptoms  Reason For Call & Symptoms: Sinus congestion, cough, runny nose.  Reviewed Health History In EMR: Yes  Reviewed Medications In EMR: Yes  Reviewed Allergies In EMR: Yes  Reviewed Surgeries / Procedures: Yes  Date of Onset of Symptoms: 03/13/2013  Guideline(s) Used:  Sinus Pain and Congestion  Disposition Per Guideline:   Home Care  Reason For Disposition Reached:   Sinus congestion as part of a cold, present < 10 days  Advice Given:  For a Runny Nose With Profuse Discharge:  Nasal mucus and discharge helps to wash viruses and bacteria out of the nose and sinuses.  Blowing the nose is all that is needed.  Hydration:  Drink plenty of liquids (6-8 glasses of water daily). If the air in your home is dry, use a cool mist humidifier  Call Back If:   Fever lasts longer than 3 days  You become worse.  Patient Will Follow Care Advice:  YES

## 2013-03-13 NOTE — Telephone Encounter (Signed)
Patient informed and stated the triage nurse had already informed her of this.

## 2013-03-13 NOTE — Telephone Encounter (Signed)
She can use over the counter Robitussin. Alternatively, we can call in Tessalon 200mg  po bid prn cough #30 no refill.

## 2013-03-19 ENCOUNTER — Telehealth: Payer: Self-pay | Admitting: Internal Medicine

## 2013-03-19 NOTE — Telephone Encounter (Signed)
Persistent cough x1 week, low grade fever.  No appt available.  Pt refused [facility] appt.  States she was seen here last week and would like something called in to [facility].

## 2013-03-19 NOTE — Telephone Encounter (Signed)
Patient confirmed appointment time for tomorrow at 1:15

## 2013-03-19 NOTE — Telephone Encounter (Signed)
Can she come tomorrow at 1:15pm?

## 2013-03-19 NOTE — Telephone Encounter (Signed)
Spoke with patient she stated she is still not any better, would like something to break up all the congestion in her chest. She has a terrible cough and I do not see any openings on tomorrow schedule, please advise.

## 2013-03-20 ENCOUNTER — Encounter: Payer: Self-pay | Admitting: Internal Medicine

## 2013-03-20 ENCOUNTER — Ambulatory Visit (INDEPENDENT_AMBULATORY_CARE_PROVIDER_SITE_OTHER): Payer: BC Managed Care – HMO | Admitting: Internal Medicine

## 2013-03-20 VITALS — BP 140/70 | HR 86 | Temp 97.5°F | Wt 156.0 lb

## 2013-03-20 DIAGNOSIS — J209 Acute bronchitis, unspecified: Secondary | ICD-10-CM

## 2013-03-20 MED ORDER — AMOXICILLIN-POT CLAVULANATE 875-125 MG PO TABS: 1.0000 | ORAL_TABLET | Freq: Two times a day (BID) | ORAL | Status: DC

## 2013-03-20 MED ORDER — HYDROCOD POLST-CHLORPHEN POLST 10-8 MG/5ML PO LQCR: 5.0000 mL | Freq: Two times a day (BID) | ORAL | Status: DC | PRN

## 2013-03-20 NOTE — Progress Notes (Signed)
Subjective:    Patient ID: Amber Glenn, female    DOB: May 31, 1950, 62 y.o.   MRN: [id number]  HPI 62YO female presents for acute visit with 1 week h/o nasal congestion, cough, temps 31F. Unable to sleep because of cough. Mild dyspnea, No chest pain. Taking Tussin with no improvement.  Outpatient Encounter Prescriptions as of 03/20/2013  Medication Sig  . Ascorbic Acid (VITAMIN C) 1000 MG tablet Take 1,000 mg by mouth daily.    Marland Kitchen aspirin 81 MG tablet Take 81 mg by mouth daily.    . carvedilol (COREG) 6.25 MG tablet Take 1 tablet (6.25 mg total) by mouth 2 (two) times daily. BRAND NAME ONLY  . furosemide (LASIX) 40 MG tablet TAKE ONE TABLET BY MOUTH ONCE DAILY  . metFORMIN (GLUCOPHAGE) 500 MG tablet Take 1 tablet (500 mg total) by mouth 2 (two) times daily with a meal.  . Multiple Vitamin (MULTIVITAMIN) capsule Take 1 capsule by mouth daily.    Marland Kitchen omeprazole (PRILOSEC) 20 MG capsule Take 1 capsule (20 mg total) by mouth daily.  . prednisoLONE acetate (PRED MILD) 0.12 % ophthalmic suspension Place 1 drop into the right eye daily.  . simvastatin (ZOCOR) 40 MG tablet Take 1 tablet (40 mg total) by mouth at bedtime.    Review of Systems  Constitutional: Positive for fever and fatigue. Negative for chills and unexpected weight change.  HENT: Positive for congestion, postnasal drip, rhinorrhea and voice change. Negative for ear discharge, ear pain, facial swelling, hearing loss, mouth sores, nosebleeds, sinus pressure, sneezing, sore throat, tinnitus and trouble swallowing.   Eyes: Negative for pain, discharge, redness and visual disturbance.  Respiratory: Positive for cough and shortness of breath. Negative for chest tightness, wheezing and stridor.   Cardiovascular: Negative for chest pain, palpitations and leg swelling.  Musculoskeletal: Negative for arthralgias, myalgias, neck pain and neck stiffness.  Skin: Negative for color change and rash.  Neurological: Negative for dizziness,  weakness, light-headedness and headaches.  Hematological: Negative for adenopathy.   BP 140/70  Pulse 86  Temp(Src) 97.5 F (36.4 C) (Oral)  Wt 156 lb (70.761 kg)  SpO2 95%     Objective:   Physical Exam  Constitutional: She is oriented to person, place, and time. She appears well-developed and well-nourished. No distress.  HENT:  Head: Normocephalic and atraumatic.  Right Ear: External ear normal.  Left Ear: External ear normal.  Nose: Nose normal.  Mouth/Throat: Oropharynx is clear and moist. No oropharyngeal exudate.  Eyes: Conjunctivae are normal. Pupils are equal, round, and reactive to light. Right eye exhibits no discharge. Left eye exhibits no discharge. No scleral icterus.  Neck: Normal range of motion. Neck supple. No tracheal deviation present. No thyromegaly present.  Cardiovascular: Normal rate, regular rhythm, normal heart sounds and intact distal pulses.  Exam reveals no gallop and no friction rub.   No murmur heard. Pulmonary/Chest: Effort normal. No accessory muscle usage. Not tachypneic. No respiratory distress. She has no decreased breath sounds. She has no wheezes. She has rhonchi in the right middle field and the left middle field. She has no rales. She exhibits no tenderness.  Musculoskeletal: Normal range of motion. She exhibits no edema and no tenderness.  Lymphadenopathy:    She has no cervical adenopathy.  Neurological: She is alert and oriented to person, place, and time. No cranial nerve deficit. She exhibits normal muscle tone. Coordination normal.  Skin: Skin is warm and dry. No rash noted. She is not diaphoretic. No erythema. No  pallor.  Psychiatric: She has a normal mood and affect. Her behavior is normal. Judgment and thought content normal.          Assessment & Plan:

## 2013-03-20 NOTE — Assessment & Plan Note (Signed)
Symptoms and exam consistent with acute bronchitis. Will start Augmentin and prn Tussionex for cough. Encouraged rest, adequate fluid intake. Follow up prn if symptoms are not improving.

## 2013-03-20 NOTE — Patient Instructions (Signed)
Start Augmentin today to help treat bronchitis.  Use Tussionex as needed for cough.   Follow up if symptoms do not improve over next few days.

## 2013-03-20 NOTE — Progress Notes (Signed)
Pre-visit discussion using our clinic review tool. No additional management support is needed unless otherwise documented below in the visit note.  

## 2013-03-23 ENCOUNTER — Telehealth: Payer: Self-pay | Admitting: Internal Medicine

## 2013-03-23 MED ORDER — BENZONATATE 200 MG PO CAPS: 200.0000 mg | ORAL_CAPSULE | Freq: Two times a day (BID) | ORAL | Status: DC | PRN

## 2013-03-23 NOTE — Telephone Encounter (Signed)
Pt was seen 12/9, says she was told to call in to let Dr. Dan Humphreys know how she is feeling today.  States she still has the cough, it has not broken up.  She is taking the nighttime medication.  States it might be helpful if she had something for daytime as well.  Just wants to let Dr. Dan Humphreys know how she is doing.

## 2013-03-23 NOTE — Telephone Encounter (Signed)
Pt advised.

## 2013-03-23 NOTE — Telephone Encounter (Signed)
OK. I will call her in some Tessalon to use for cough during the day.

## 2013-06-11 ENCOUNTER — Other Ambulatory Visit: Payer: Self-pay | Admitting: Internal Medicine

## 2013-07-24 ENCOUNTER — Other Ambulatory Visit: Payer: Self-pay | Admitting: Internal Medicine

## 2013-08-22 ENCOUNTER — Ambulatory Visit: Payer: Self-pay | Admitting: Adult Health

## 2013-08-22 ENCOUNTER — Encounter: Payer: Self-pay | Admitting: Adult Health

## 2013-08-22 ENCOUNTER — Ambulatory Visit (INDEPENDENT_AMBULATORY_CARE_PROVIDER_SITE_OTHER): Payer: BC Managed Care – HMO | Admitting: Adult Health

## 2013-08-22 VITALS — BP 152/77 | HR 80 | Temp 97.5°F | Wt 159.0 lb

## 2013-08-22 DIAGNOSIS — M25512 Pain in left shoulder: Secondary | ICD-10-CM

## 2013-08-22 DIAGNOSIS — M25519 Pain in unspecified shoulder: Secondary | ICD-10-CM

## 2013-08-22 MED ORDER — TRAMADOL HCL 50 MG PO TABS: 50.0000 mg | ORAL_TABLET | Freq: Every evening | ORAL | Status: DC | PRN

## 2013-08-22 NOTE — Progress Notes (Signed)
Pre visit review using our clinic review tool, if applicable. No additional management support is needed unless otherwise documented below in the visit note. 

## 2013-08-22 NOTE — Patient Instructions (Addendum)
Pleas go to [facility] for an xray of the left shoulder.  Take ibuprofen 600 mg every 6 hours for the next 5 days. Take with food.  Apply ice to the affected areas for 15 min at a time. Do this approximately 4-5 times daily.  Take tramadol at bedtime as needed for pain.  If no improvement within 1 week please call the office and I will refer you to ortho.

## 2013-08-22 NOTE — Progress Notes (Signed)
Patient ID: Amber Glenn, female   DOB: 02-06-1951, 63 y.o.   MRN: [id number]   Subjective:    Patient ID: Amber Glenn, female    DOB: 1950/07/07, 63 y.o.   MRN: [id number]  HPI Pt is a pleasant 63 y/o female who presents to clinic with Left shoulder pain. Fell while playing ball with her grandson. Her grandson hit a line drive right at her and she was trying to get away from the ball and tripped over a tree root. Has had bursitis in that area and thought she was just having a flare but the pain has not improved. Too painful for her to sleep on but not having any limited range of motion. Has been applying heat and taking ibuprofen as needed. Has also taken tylenol.   Past Medical History  Diagnosis Date  . Hyperlipidemia   . Diabetes mellitus   . HTN (hypertension)   . Cataract     right  . Post corneal transplant     right    Current Outpatient Prescriptions on File Prior to Visit  Medication Sig Dispense Refill  . Ascorbic Acid (VITAMIN C) 1000 MG tablet Take 1,000 mg by mouth daily.        Marland Kitchen aspirin 81 MG tablet Take 81 mg by mouth daily.        . carvedilol (COREG) 6.25 MG tablet TAKE ONE TABLET BY MOUTH TWICE DAILY  180 tablet  0  . furosemide (LASIX) 40 MG tablet TAKE ONE TABLET BY MOUTH ONCE DAILY  90 tablet  1  . metFORMIN (GLUCOPHAGE) 500 MG tablet Take 1 tablet (500 mg total) by mouth 2 (two) times daily with a meal.  180 tablet  4  . Multiple Vitamin (MULTIVITAMIN) capsule Take 1 capsule by mouth daily.        Marland Kitchen omeprazole (PRILOSEC) 20 MG capsule Take 1 capsule (20 mg total) by mouth daily.  30 capsule  12  . prednisoLONE acetate (PRED MILD) 0.12 % ophthalmic suspension Place 1 drop into the right eye daily.      . simvastatin (ZOCOR) 40 MG tablet TAKE ONE TABLET BY MOUTH AT BEDTIME  90 tablet  1  . chlorpheniramine-HYDROcodone (TUSSIONEX) 10-8 MG/5ML LQCR Take 5 mLs by mouth every 12 (twelve) hours as needed for cough.  140 mL  0   No current facility-administered  medications on file prior to visit.     Review of Systems  Musculoskeletal: Positive for arthralgias (left shoulder pain. cannot sleep on her left side 2/2 pain).  All other systems reviewed and are negative.     Objective:  BP 152/77  Pulse 80  Temp(Src) 97.5 F (36.4 C) (Oral)  Wt 159 lb (72.122 kg)  SpO2 95%   Physical Exam  Constitutional: She is oriented to person, place, and time. No distress.  HENT:  Head: Normocephalic and atraumatic.  Eyes: Conjunctivae and EOM are normal.  Neck: Normal range of motion. Neck supple.  Cardiovascular: Normal rate and regular rhythm.   Pulmonary/Chest: Effort normal. No respiratory distress.  Musculoskeletal: Normal range of motion. She exhibits tenderness. She exhibits no edema.  Difficulty abducting arm anteriorly. Only able to raise to ~ 90 before beginning to experience pain. She has better range of motion abducting laterally. Pain with palpation of the greater tubercle area. Hx of bursitis. Pain also with palpation of the scapula area.   Neurological: She is alert and oriented to person, place, and time. She has normal reflexes. Coordination normal.  Skin: Skin is warm and dry.  Psychiatric: She has a normal mood and affect. Her behavior is normal. Judgment and thought content normal.      Assessment & Plan:   1. Pain in left shoulder Pt fell on her left shoulder while playing ball with her grandson. Some difficulty with ROM. Hx of bursitis. Ice, rest, ibuprofen every 6 hours with food for the next 5 days. Tramadol for pain at bedtime. Send for xray at [facility]. If no improvement in 1 week will refer to ortho. Pt states she has trip planned June 1 and may wait until she gets back if still not completely well. - traMADol (ULTRAM) 50 MG tablet; Take 1 tablet (50 mg total) by mouth at bedtime as needed.  Dispense: 15 tablet; Refill: 0 - DG Shoulder Left; Future

## 2013-08-23 ENCOUNTER — Telehealth: Payer: Self-pay | Admitting: Adult Health

## 2013-08-23 NOTE — Telephone Encounter (Signed)
Tendinitis of left shoulder. No fracture. Offered referral to ortho.

## 2013-08-24 ENCOUNTER — Telehealth: Payer: Self-pay

## 2013-08-24 NOTE — Telephone Encounter (Signed)
MyChart message sent to her yesterday with results.

## 2013-08-24 NOTE — Telephone Encounter (Signed)
Patient called ask for results from shoulder Xray done 08/22/13. Patient states she's been taking Tramdol 50mg  at bedtime without and relief from pain. Wanted to know if there is anything else she can take and what did her Xray show? Please advice.

## 2013-08-24 NOTE — Telephone Encounter (Signed)
Notified patient that a [facility] message was sent yesterday with the results. Also notified her of Raquel's comments from the [facility] message. Patient verbalized understanding. Patient stated she will call back to let us know if she wants a referral.

## 2013-09-11 ENCOUNTER — Encounter: Payer: Self-pay | Admitting: Adult Health

## 2013-09-20 ENCOUNTER — Ambulatory Visit: Payer: BC Managed Care – HMO | Admitting: Internal Medicine

## 2013-09-20 ENCOUNTER — Telehealth: Payer: Self-pay | Admitting: Internal Medicine

## 2013-09-20 ENCOUNTER — Encounter: Payer: Self-pay | Admitting: Internal Medicine

## 2013-09-20 ENCOUNTER — Ambulatory Visit (INDEPENDENT_AMBULATORY_CARE_PROVIDER_SITE_OTHER): Payer: BC Managed Care – HMO | Admitting: Internal Medicine

## 2013-09-20 VITALS — BP 132/78 | HR 98 | Resp 16 | Ht 62.0 in | Wt 155.8 lb

## 2013-09-20 DIAGNOSIS — M25519 Pain in unspecified shoulder: Secondary | ICD-10-CM

## 2013-09-20 DIAGNOSIS — M25512 Pain in left shoulder: Secondary | ICD-10-CM

## 2013-09-20 DIAGNOSIS — E785 Hyperlipidemia, unspecified: Secondary | ICD-10-CM

## 2013-09-20 DIAGNOSIS — E119 Type 2 diabetes mellitus without complications: Secondary | ICD-10-CM

## 2013-09-20 DIAGNOSIS — I10 Essential (primary) hypertension: Secondary | ICD-10-CM

## 2013-09-20 LAB — LIPID PANEL
Cholesterol: 164 mg/dL (ref 0–200)
HDL: 37.4 mg/dL — ABNORMAL LOW (ref >39.00)
LDL Cholesterol: 71 mg/dL (ref 0–99)
NonHDL: 126.6
Total CHOL/HDL Ratio: 4
Triglycerides: 276 mg/dL — ABNORMAL HIGH (ref 0.0–149.0)
VLDL: 55.2 mg/dL — ABNORMAL HIGH (ref 0.0–40.0)

## 2013-09-20 LAB — COMPREHENSIVE METABOLIC PANEL
ALT: 60 U/L — ABNORMAL HIGH (ref 0–35)
AST: 45 U/L — ABNORMAL HIGH (ref 0–37)
Albumin: 4 g/dL (ref 3.5–5.2)
Alkaline Phosphatase: 73 U/L (ref 39–117)
BUN: 13 mg/dL (ref 6–23)
CO2: 30 mEq/L (ref 19–32)
Calcium: 9.4 mg/dL (ref 8.4–10.5)
Chloride: 102 mEq/L (ref 96–112)
Creatinine, Ser: 0.8 mg/dL (ref 0.4–1.2)
GFR: 80.06 mL/min (ref >60.00)
Glucose, Bld: 150 mg/dL — ABNORMAL HIGH (ref 70–99)
Potassium: 3.4 mEq/L — ABNORMAL LOW (ref 3.5–5.1)
Sodium: 141 mEq/L (ref 135–145)
Total Bilirubin: 0.3 mg/dL (ref 0.2–1.2)
Total Protein: 7.3 g/dL (ref 6.0–8.3)

## 2013-09-20 LAB — HEMOGLOBIN A1C: Hgb A1c MFr Bld: 6.5 % (ref 4.6–6.5)

## 2013-09-20 MED ORDER — CARVEDILOL 6.25 MG PO TABS: 6.2500 mg | ORAL_TABLET | Freq: Two times a day (BID) | ORAL | Status: DC

## 2013-09-20 MED ORDER — FUROSEMIDE 40 MG PO TABS: 40.0000 mg | ORAL_TABLET | Freq: Every day | ORAL | Status: DC

## 2013-09-20 MED ORDER — METFORMIN HCL 500 MG PO TABS: 500.0000 mg | ORAL_TABLET | Freq: Two times a day (BID) | ORAL | Status: DC

## 2013-09-20 MED ORDER — OMEPRAZOLE 20 MG PO CPDR: 20.0000 mg | DELAYED_RELEASE_CAPSULE | Freq: Every day | ORAL | Status: DC

## 2013-09-20 MED ORDER — SIMVASTATIN 40 MG PO TABS: 40.0000 mg | ORAL_TABLET | Freq: Every day | ORAL | Status: DC

## 2013-09-20 NOTE — Progress Notes (Signed)
Pre visit review using our clinic review tool, if applicable. No additional management support is needed unless otherwise documented below in the visit note. 

## 2013-09-20 NOTE — Assessment & Plan Note (Signed)
Persistent left shoulder pain over several months. Suspect adhesive capsulitis. Recommended sports medicine evaluation. Pt would like to hold off for now. Will continue Ibuprofen and icing shoulder.

## 2013-09-20 NOTE — Assessment & Plan Note (Signed)
Lab Results  Component Value Date   LDLCALC 80 12/01/2012   Will recheck lipids with labs today. Continue Simvastatin.

## 2013-09-20 NOTE — Assessment & Plan Note (Signed)
BP Readings from Last 3 Encounters:  09/20/13 132/78  08/22/13 152/77  03/20/13 140/70   BP well controlled on Carvedilol. Will continue.

## 2013-09-20 NOTE — Assessment & Plan Note (Signed)
Will check A1c with labs today. Continue Metformin. Follow up in 3-6 months.

## 2013-09-20 NOTE — Progress Notes (Signed)
Subjective:    Patient ID: Amber Glenn, female    DOB: [birth year], 63 y.o.   MRN: [id number]  HPI 63YO female presents for follow up.  DM - Doing well. Does not check BG on a regular basis. Compliant with metformin.  Notes recent increase in LE edema, esp when spending time outdoors walking in warmer weather. Using Lasix 40mg  daily. Has increased Lasix to bid on 1 occasion with improvement. Swelling improves at night. No dyspnea or other symptoms.  Also notes chronic left shoulder pain ongoing for months. Pain does not occur at rest. Only with abduction and external rotation of her arm. Has been using Ibuprofen and icing shoulder with some improvement.  No other new concerns today.  Review of Systems  Constitutional: Negative for fever, chills, appetite change, fatigue and unexpected weight change.  HENT: Negative for congestion, ear pain, sinus pressure, sore throat, trouble swallowing and voice change.   Eyes: Negative for visual disturbance.  Respiratory: Negative for cough, shortness of breath, wheezing and stridor.   Cardiovascular: Positive for leg swelling. Negative for chest pain and palpitations.  Gastrointestinal: Negative for nausea, vomiting, abdominal pain, diarrhea, constipation, blood in stool, abdominal distention and anal bleeding.  Genitourinary: Negative for dysuria and flank pain.  Musculoskeletal: Positive for arthralgias. Negative for gait problem, myalgias and neck pain.  Skin: Negative for color change and rash.  Neurological: Negative for dizziness and headaches.  Hematological: Negative for adenopathy. Does not bruise/bleed easily.  Psychiatric/Behavioral: Negative for suicidal ideas, sleep disturbance and dysphoric mood. The patient is not nervous/anxious.        Objective:    BP 132/78  Pulse 98  Resp 16  Ht 5\' 2"  (1.575 m)  Wt 155 lb 12 oz (70.648 kg)  BMI 28.48 kg/m2  SpO2 94% Physical Exam  Constitutional: She is oriented to person, place,  and time. She appears well-developed and well-nourished. No distress.  HENT:  Head: Normocephalic and atraumatic.  Right Ear: External ear normal.  Left Ear: External ear normal.  Nose: Nose normal.  Mouth/Throat: Oropharynx is clear and moist. No oropharyngeal exudate.  Eyes: Conjunctivae are normal. Pupils are equal, round, and reactive to light. Right eye exhibits no discharge. Left eye exhibits no discharge. No scleral icterus.  Neck: Normal range of motion. Neck supple. No tracheal deviation present. No thyromegaly present.  Cardiovascular: Normal rate, regular rhythm, normal heart sounds and intact distal pulses.  Exam reveals no gallop and no friction rub.   No murmur heard. Pulmonary/Chest: Effort normal and breath sounds normal. No accessory muscle usage. Not tachypneic. No respiratory distress. She has no decreased breath sounds. She has no wheezes. She has no rhonchi. She has no rales. She exhibits no tenderness.  Musculoskeletal: She exhibits no edema and no tenderness.       Left shoulder: She exhibits decreased range of motion and pain. She exhibits no tenderness and normal strength.  Lymphadenopathy:    She has no cervical adenopathy.  Neurological: She is alert and oriented to person, place, and time. No cranial nerve deficit. She exhibits normal muscle tone. Coordination normal.  Skin: Skin is warm and dry. No rash noted. She is not diaphoretic. No erythema. No pallor.  Psychiatric: She has a normal mood and affect. Her behavior is normal. Judgment and thought content normal.          Assessment & Plan:   Problem List Items Addressed This Visit     Unprioritized   DIABETES MELLITUS, WITHOUT COMPLICATIONS -  Primary     Will check A1c with labs today. Continue Metformin. Follow up in 3-6 months.    Relevant Medications      simvastatin (ZOCOR) tablet      metFORMIN (GLUCOPHAGE) tablet   Other Relevant Orders      Hemoglobin A1c      Comprehensive metabolic panel        Lipid panel      Microalbumin / creatinine urine ratio   HYPERLIPIDEMIA      Lab Results  Component Value Date   LDLCALC 80 12/01/2012   Will recheck lipids with labs today. Continue Simvastatin.    Relevant Medications      simvastatin (ZOCOR) tablet      carvedilol (COREG) tablet      furosemide (LASIX) tablet   Hypertension      BP Readings from Last 3 Encounters:  09/20/13 132/78  08/22/13 152/77  03/20/13 140/70   BP well controlled on Carvedilol. Will continue.    Relevant Medications      simvastatin (ZOCOR) tablet      carvedilol (COREG) tablet      furosemide (LASIX) tablet   Left shoulder pain     Persistent left shoulder pain over several months. Suspect adhesive capsulitis. Recommended sports medicine evaluation. Pt would like to hold off for now. Will continue Ibuprofen and icing shoulder.        Return in about 4 months (around 01/20/2014) for Wellness Visit.

## 2013-09-20 NOTE — Telephone Encounter (Signed)
Relevant patient education mailed to patient.  

## 2013-09-21 LAB — MICROALBUMIN / CREATININE URINE RATIO
Creatinine,U: 187.9 mg/dL
Microalb Creat Ratio: 1.8 mg/g (ref 0.0–30.0)
Microalb, Ur: 3.4 mg/dL — ABNORMAL HIGH (ref 0.0–1.9)

## 2013-09-26 ENCOUNTER — Other Ambulatory Visit: Payer: Self-pay | Admitting: Internal Medicine

## 2013-11-28 ENCOUNTER — Other Ambulatory Visit: Payer: Self-pay | Admitting: Internal Medicine

## 2014-01-11 ENCOUNTER — Other Ambulatory Visit: Payer: Self-pay | Admitting: *Deleted

## 2014-01-11 DIAGNOSIS — E785 Hyperlipidemia, unspecified: Secondary | ICD-10-CM

## 2014-01-11 DIAGNOSIS — E119 Type 2 diabetes mellitus without complications: Secondary | ICD-10-CM

## 2014-01-11 MED ORDER — FUROSEMIDE 40 MG PO TABS: 40.0000 mg | ORAL_TABLET | Freq: Every day | ORAL | Status: DC

## 2014-01-11 MED ORDER — SIMVASTATIN 40 MG PO TABS: 40.0000 mg | ORAL_TABLET | Freq: Every day | ORAL | Status: DC

## 2014-01-11 MED ORDER — METFORMIN HCL 500 MG PO TABS: 500.0000 mg | ORAL_TABLET | Freq: Two times a day (BID) | ORAL | Status: DC

## 2014-01-11 MED ORDER — CARVEDILOL 6.25 MG PO TABS: 6.2500 mg | ORAL_TABLET | Freq: Two times a day (BID) | ORAL | Status: DC

## 2014-01-21 ENCOUNTER — Encounter: Payer: Self-pay | Admitting: Internal Medicine

## 2014-01-21 ENCOUNTER — Telehealth: Payer: Self-pay | Admitting: *Deleted

## 2014-01-21 ENCOUNTER — Ambulatory Visit (INDEPENDENT_AMBULATORY_CARE_PROVIDER_SITE_OTHER): Payer: Commercial Managed Care - HMO | Admitting: Internal Medicine

## 2014-01-21 VITALS — BP 152/90 | HR 79 | Temp 97.9°F | Ht 61.0 in | Wt 151.5 lb

## 2014-01-21 DIAGNOSIS — E785 Hyperlipidemia, unspecified: Secondary | ICD-10-CM

## 2014-01-21 DIAGNOSIS — Z0000 Encounter for general adult medical examination without abnormal findings: Secondary | ICD-10-CM

## 2014-01-21 DIAGNOSIS — R232 Flushing: Secondary | ICD-10-CM

## 2014-01-21 DIAGNOSIS — Z23 Encounter for immunization: Secondary | ICD-10-CM

## 2014-01-21 DIAGNOSIS — Z1239 Encounter for other screening for malignant neoplasm of breast: Secondary | ICD-10-CM

## 2014-01-21 DIAGNOSIS — E119 Type 2 diabetes mellitus without complications: Secondary | ICD-10-CM

## 2014-01-21 DIAGNOSIS — N951 Menopausal and female climacteric states: Secondary | ICD-10-CM

## 2014-01-21 DIAGNOSIS — I10 Essential (primary) hypertension: Secondary | ICD-10-CM

## 2014-01-21 LAB — COMPREHENSIVE METABOLIC PANEL
ALT: 34 U/L (ref 0–35)
AST: 34 U/L (ref 0–37)
Albumin: 3.6 g/dL (ref 3.5–5.2)
Alkaline Phosphatase: 60 U/L (ref 39–117)
BUN: 11 mg/dL (ref 6–23)
CO2: 29 mEq/L (ref 19–32)
Calcium: 9.4 mg/dL (ref 8.4–10.5)
Chloride: 100 mEq/L (ref 96–112)
Creatinine, Ser: 0.9 mg/dL (ref 0.4–1.2)
GFR: 67.66 mL/min (ref >60.00)
Glucose, Bld: 110 mg/dL — ABNORMAL HIGH (ref 70–99)
Potassium: 3.6 mEq/L (ref 3.5–5.1)
Sodium: 140 mEq/L (ref 135–145)
Total Bilirubin: 0.4 mg/dL (ref 0.2–1.2)
Total Protein: 7.6 g/dL (ref 6.0–8.3)

## 2014-01-21 LAB — MICROALBUMIN / CREATININE URINE RATIO
Creatinine,U: 39.4 mg/dL
Microalb Creat Ratio: 1.5 mg/g (ref 0.0–30.0)
Microalb, Ur: 0.6 mg/dL (ref 0.0–1.9)

## 2014-01-21 LAB — HM DIABETES FOOT EXAM: HM Diabetic Foot Exam: NORMAL

## 2014-01-21 LAB — LIPID PANEL
Cholesterol: 157 mg/dL (ref 0–200)
HDL: 39.4 mg/dL (ref >39.00)
LDL Cholesterol: 88 mg/dL (ref 0–99)
NonHDL: 117.6
Total CHOL/HDL Ratio: 4
Triglycerides: 150 mg/dL — ABNORMAL HIGH (ref 0.0–149.0)
VLDL: 30 mg/dL (ref 0.0–40.0)

## 2014-01-21 LAB — TSH: TSH: 0.65 u[IU]/mL (ref 0.35–4.50)

## 2014-01-21 LAB — HEMOGLOBIN A1C: Hgb A1c MFr Bld: 6.2 % (ref 4.6–6.5)

## 2014-01-21 MED ORDER — LOSARTAN POTASSIUM 25 MG PO TABS: 25.0000 mg | ORAL_TABLET | Freq: Every day | ORAL | Status: DC

## 2014-01-21 NOTE — Assessment & Plan Note (Signed)
Will check lipids with labs today.  

## 2014-01-21 NOTE — Progress Notes (Signed)
The patient is here for annual Medicare Wellness Examination and management of other chronic and acute problems.   The risk factors are reflected in the history.  The roster of all physicians providing medical care to patient - is listed in the Snapshot section of the chart.  Activities of daily living:   The patient is 100% independent in all ADLs: dressing, toileting, feeding as well as independent mobility. Patient lives with daughter and 3 children. Dog in home. Lives in 1 story home.  Home safety :  The patient has smoke detectors in the home.  They wear seatbelts in their car. There are no firearms at home.  There is no violence in the home. They feel safe where they live.  Infectious Risks: There is no risks for hepatitis, STDs or HIV.  There is no  history of blood transfusion.  They have no travel history to infectious disease endemic areas of the world.  Additional Health Care Providers: The patient has seen their dentist in the last six months. Dentist - Dr. Rudi Heap They have seen their eye doctor in the last year. Opthalmologist - Dr. Chong Sicilian They deny hearing issues. They have deferred audiologic testing in the last year.   They do not  have excessive sun exposure. Discussed the need for sun protection: hats,long sleeves and use of sunscreen if there is significant sun exposure.  Dermatologist - Dr. Evorn Gong  Diet: the importance of a healthy diet is discussed. They do have a healthy diet.  The benefits of regular aerobic exercise were discussed. Patient exercises by walking.  Depression screen: there are no signs or vegative symptoms of depression- irritability, change in appetite, anhedonia, sadness/tearfullness.  Cognitive assessment: the patient manages all their financial and personal affairs and is actively engaged. They could relate day,date,year and events.  HCPOA - none in place  The following portions of the patient's history were reviewed and updated as  appropriate: allergies, current medications, past family history, past medical history,  past surgical history, past social history and problem list.  Visual acuity was not assessed per patient preference as they have regular follow up with their ophthalmologist. Hearing and body mass index were assessed and reviewed.   During the course of the visit the patient was educated and counseled about appropriate screening and preventive services including : fall prevention , diabetes screening, nutrition counseling, colorectal cancer screening, and recommended immunizations.    Concerned about hot flashes over the last month. Feels warm mostly in her face. No chest pain, dyspnea.  Checking BG recently, typically 120-130.  Review of Systems  Constitutional: Positive for diaphoresis. Negative for fever, chills, appetite change, fatigue and unexpected weight change.  Eyes: Negative for visual disturbance.  Respiratory: Negative for shortness of breath.   Cardiovascular: Positive for palpitations (occasional). Negative for chest pain and leg swelling.  Gastrointestinal: Negative for nausea, vomiting, abdominal pain, diarrhea and constipation.  Musculoskeletal: Negative for arthralgias and myalgias.  Skin: Negative for color change and rash.  Hematological: Negative for adenopathy. Does not bruise/bleed easily.  Psychiatric/Behavioral: Negative for sleep disturbance and dysphoric mood. The patient is not nervous/anxious.        Objective:    BP 152/90  Pulse 79  Temp(Src) 97.9 F (36.6 C) (Oral)  Ht 5\' 1"  (1.549 m)  Wt 151 lb 8 oz (68.72 kg)  BMI 28.64 kg/m2  SpO2 95% Physical Exam  Constitutional: She is oriented to person, place, and time. She appears well-developed and well-nourished. No distress.  HENT:  Head: Normocephalic and atraumatic.  Right Ear: External ear normal.  Left Ear: External ear normal.  Nose: Nose normal.  Mouth/Throat: Oropharynx is clear and moist. No  oropharyngeal exudate.  Eyes: Conjunctivae are normal. Pupils are equal, round, and reactive to light. Right eye exhibits no discharge. Left eye exhibits no discharge. No scleral icterus.  Neck: Normal range of motion. Neck supple. No tracheal deviation present. No thyromegaly present.  Cardiovascular: Normal rate, regular rhythm, normal heart sounds and intact distal pulses.  Exam reveals no gallop and no friction rub.   No murmur heard. Pulmonary/Chest: Effort normal and breath sounds normal. No accessory muscle usage. Not tachypneic. No respiratory distress. She has no decreased breath sounds. She has no wheezes. She has no rhonchi. She has no rales. She exhibits no tenderness. Right breast exhibits no inverted nipple, no mass, no nipple discharge, no skin change and no tenderness. Left breast exhibits no inverted nipple, no mass, no nipple discharge, no skin change and no tenderness. Breasts are symmetrical.  Abdominal: Soft. Bowel sounds are normal. She exhibits no distension and no mass. There is no tenderness. There is no rebound and no guarding.  Musculoskeletal: Normal range of motion. She exhibits no edema and no tenderness.  Lymphadenopathy:    She has no cervical adenopathy.  Neurological: She is alert and oriented to person, place, and time. No cranial nerve deficit. She exhibits normal muscle tone. Coordination normal.  Skin: Skin is warm and dry. No rash noted. She is not diaphoretic. No erythema. No pallor.  Psychiatric: She has a normal mood and affect. Her behavior is normal. Judgment and thought content normal.          Assessment & Plan:   Problem List Items Addressed This Visit     Unprioritized   Diabetes mellitus type 2, controlled     Will check A1c with labs today. Continue Metformin.    Relevant Medications      losartan (COZAAR) tablet   Other Relevant Orders      Hemoglobin A1c      Lipid panel   Hot flashes     Will check TSH with labs.    Relevant  Orders      TSH   Hyperlipidemia     Will check lipids with labs today.    Relevant Medications      losartan (COZAAR) tablet   Hypertension      BP Readings from Last 3 Encounters:  01/21/14 152/90  09/20/13 132/78  08/22/13 152/77   BP elevated today. Will start losartan 25mg  daily. Recheck Cr and K in 1 week. BP recheck in 4 weeks.    Relevant Medications      losartan (COZAAR) tablet   Other Relevant Orders      Comprehensive metabolic panel      Microalbumin / creatinine urine ratio   Medicare annual wellness visit, initial - Primary     General medical exam including breast exam normal today. Mammogram ordered. PAP and pelvic deferred as normal PAP, HPV neg in 2014. Labs today including CMP, lipids, A1c. Prevnar today. Flu vaccine already completed. Encouraged healthy diet and exercise.     Other Visit Diagnoses   Screening for breast cancer        Relevant Orders       MM Digital Screening        Return in about 4 weeks (around 02/18/2014) for Recheck of Blood Pressure.

## 2014-01-21 NOTE — Patient Instructions (Signed)
Start Losartan 23m daily to help control blood pressure.  Recheck labs in 1 week.  Health Maintenance Adopting a healthy lifestyle and getting preventive care can go a long way to promote health and wellness. Talk with your health care provider about what schedule of regular examinations is right for you. This is a good chance for you to check in with your provider about disease prevention and staying healthy. In between checkups, there are plenty of things you can do on your own. Experts have done a lot of research about which lifestyle changes and preventive measures are most likely to keep you healthy. Ask your health care provider for more information. WEIGHT AND DIET  Eat a healthy diet  Be sure to include plenty of vegetables, fruits, low-fat dairy products, and lean protein.  Do not eat a lot of foods high in solid fats, added sugars, or salt.  Get regular exercise. This is one of the most important things you can do for your health.  Most adults should exercise for at least 150 minutes each week. The exercise should increase your heart rate and make you sweat (moderate-intensity exercise).  Most adults should also do strengthening exercises at least twice a week. This is in addition to the moderate-intensity exercise.  Maintain a healthy weight  Body mass index (BMI) is a measurement that can be used to identify possible weight problems. It estimates body fat based on height and weight. Your health care provider can help determine your BMI and help you achieve or maintain a healthy weight.  For females 232years of age and older:   A BMI below 18.5 is considered underweight.  A BMI of 18.5 to 24.9 is normal.  A BMI of 25 to 29.9 is considered overweight.  A BMI of 30 and above is considered obese.  Watch levels of cholesterol and blood lipids  You should start having your blood tested for lipids and cholesterol at 65years of age, then have this test every 5 years.  You  may need to have your cholesterol levels checked more often if:  Your lipid or cholesterol levels are high.  You are older than 65years of age.  You are at high risk for heart disease.  CANCER SCREENING   Lung Cancer  Lung cancer screening is recommended for adults 54868years old who are at high risk for lung cancer because of a history of smoking.  A yearly low-dose CT scan of the lungs is recommended for people who:  Currently smoke.  Have quit within the past 15 years.  Have at least a 30-pack-year history of smoking. A pack year is smoking an average of one pack of cigarettes a day for 1 year.  Yearly screening should continue until it has been 15 years since you quit.  Yearly screening should stop if you develop a health problem that would prevent you from having lung cancer treatment.  Breast Cancer  Practice breast self-awareness. This means understanding how your breasts normally appear and feel.  It also means doing regular breast self-exams. Let your health care provider know about any changes, no matter how small.  If you are in your 20s or 30s, you should have a clinical breast exam (CBE) by a health care provider every 1-3 years as part of a regular health exam.  If you are 427or older, have a CBE every year. Also consider having a breast X-ray (mammogram) every year.  If you have a family history  of breast cancer, talk to your health care provider about genetic screening.  If you are at high risk for breast cancer, talk to your health care provider about having an MRI and a mammogram every year.  Breast cancer gene (BRCA) assessment is recommended for women who have family members with BRCA-related cancers. BRCA-related cancers include:  Breast.  Ovarian.  Tubal.  Peritoneal cancers.  Results of the assessment will determine the need for genetic counseling and BRCA1 and BRCA2 testing. Cervical Cancer Routine pelvic examinations to screen for  cervical cancer are no longer recommended for nonpregnant women who are considered low risk for cancer of the pelvic organs (ovaries, uterus, and vagina) and who do not have symptoms. A pelvic examination may be necessary if you have symptoms including those associated with pelvic infections. Ask your health care provider if a screening pelvic exam is right for you.   The Pap test is the screening test for cervical cancer for women who are considered at risk.  If you had a hysterectomy for a problem that was not cancer or a condition that could lead to cancer, then you no longer need Pap tests.  If you are older than 65 years, and you have had normal Pap tests for the past 10 years, you no longer need to have Pap tests.  If you have had past treatment for cervical cancer or a condition that could lead to cancer, you need Pap tests and screening for cancer for at least 20 years after your treatment.  If you no longer get a Pap test, assess your risk factors if they change (such as having a new sexual partner). This can affect whether you should start being screened again.  Some women have medical problems that increase their chance of getting cervical cancer. If this is the case for you, your health care provider may recommend more frequent screening and Pap tests.  The human papillomavirus (HPV) test is another test that may be used for cervical cancer screening. The HPV test looks for the virus that can cause cell changes in the cervix. The cells collected during the Pap test can be tested for HPV.  The HPV test can be used to screen women 64 years of age and older. Getting tested for HPV can extend the interval between normal Pap tests from three to five years.  An HPV test also should be used to screen women of any age who have unclear Pap test results.  After 65 years of age, women should have HPV testing as often as Pap tests.  Colorectal Cancer  This type of cancer can be detected and  often prevented.  Routine colorectal cancer screening usually begins at 65 years of age and continues through 65 years of age.  Your health care provider may recommend screening at an earlier age if you have risk factors for colon cancer.  Your health care provider may also recommend using home test kits to check for hidden blood in the stool.  A small camera at the end of a tube can be used to examine your colon directly (sigmoidoscopy or colonoscopy). This is done to check for the earliest forms of colorectal cancer.  Routine screening usually begins at age 58.  Direct examination of the colon should be repeated every 5-10 years through 65 years of age. However, you may need to be screened more often if early forms of precancerous polyps or small growths are found. Skin Cancer  Check your skin from head  to toe regularly.  Tell your health care provider about any new moles or changes in moles, especially if there is a change in a mole's shape or color.  Also tell your health care provider if you have a mole that is larger than the size of a pencil eraser.  Always use sunscreen. Apply sunscreen liberally and repeatedly throughout the day.  Protect yourself by wearing long sleeves, pants, a wide-brimmed hat, and sunglasses whenever you are outside. HEART DISEASE, DIABETES, AND HIGH BLOOD PRESSURE   Have your blood pressure checked at least every 1-2 years. High blood pressure causes heart disease and increases the risk of stroke.  If you are between 36 years and 70 years old, ask your health care provider if you should take aspirin to prevent strokes.  Have regular diabetes screenings. This involves taking a blood sample to check your fasting blood sugar level.  If you are at a normal weight and have a low risk for diabetes, have this test once every three years after 65 years of age.  If you are overweight and have a high risk for diabetes, consider being tested at a younger age or  more often. PREVENTING INFECTION  Hepatitis B  If you have a higher risk for hepatitis B, you should be screened for this virus. You are considered at high risk for hepatitis B if:  You were born in a country where hepatitis B is common. Ask your health care provider which countries are considered high risk.  Your parents were born in a high-risk country, and you have not been immunized against hepatitis B (hepatitis B vaccine).  You have HIV or AIDS.  You use needles to inject street drugs.  You live with someone who has hepatitis B.  You have had sex with someone who has hepatitis B.  You get hemodialysis treatment.  You take certain medicines for conditions, including cancer, organ transplantation, and autoimmune conditions. Hepatitis C  Blood testing is recommended for:  Everyone born from [birth year] through 1967.  Anyone with known risk factors for hepatitis C. Sexually transmitted infections (STIs)  You should be screened for sexually transmitted infections (STIs) including gonorrhea and chlamydia if:  You are sexually active and are younger than 65 years of age.  You are older than 65 years of age and your health care provider tells you that you are at risk for this type of infection.  Your sexual activity has changed since you were last screened and you are at an increased risk for chlamydia or gonorrhea. Ask your health care provider if you are at risk.  If you do not have HIV, but are at risk, it may be recommended that you take a prescription medicine daily to prevent HIV infection. This is called pre-exposure prophylaxis (PrEP). You are considered at risk if:  You are sexually active and do not regularly use condoms or know the HIV status of your partner(s).  You take drugs by injection.  You are sexually active with a partner who has HIV. Talk with your health care provider about whether you are at high risk of being infected with HIV. If you choose to begin PrEP,  you should first be tested for HIV. You should then be tested every 3 months for as long as you are taking PrEP.  PREGNANCY   If you are premenopausal and you may become pregnant, ask your health care provider about preconception counseling.  If you may become pregnant, take 400 to 800 micrograms (  mcg) of folic acid every day.  If you want to prevent pregnancy, talk to your health care provider about birth control (contraception). OSTEOPOROSIS AND MENOPAUSE   Osteoporosis is a disease in which the bones lose minerals and strength with aging. This can result in serious bone fractures. Your risk for osteoporosis can be identified using a bone density scan.  If you are 37 years of age or older, or if you are at risk for osteoporosis and fractures, ask your health care provider if you should be screened.  Ask your health care provider whether you should take a calcium or vitamin D supplement to lower your risk for osteoporosis.  Menopause may have certain physical symptoms and risks.  Hormone replacement therapy may reduce some of these symptoms and risks. Talk to your health care provider about whether hormone replacement therapy is right for you.  HOME CARE INSTRUCTIONS   Schedule regular health, dental, and eye exams.  Stay current with your immunizations.   Do not use any tobacco products including cigarettes, chewing tobacco, or electronic cigarettes.  If you are pregnant, do not drink alcohol.  If you are breastfeeding, limit how much and how often you drink alcohol.  Limit alcohol intake to no more than 1 drink per day for nonpregnant women. One drink equals 12 ounces of beer, 5 ounces of wine, or 1 ounces of hard liquor.  Do not use street drugs.  Do not share needles.  Ask your health care provider for help if you need support or information about quitting drugs.  Tell your health care provider if you often feel depressed.  Tell your health care provider if you have  ever been abused or do not feel safe at home. Document Released: 10/12/2010 Document Revised: 08/13/2013 Document Reviewed: 02/28/2013 [facility] Patient Information 2015 [place], [place]. This information is not intended to replace advice given to you by your health care provider. Make sure you discuss any questions you have with your health care provider.

## 2014-01-21 NOTE — Assessment & Plan Note (Signed)
General medical exam including breast exam normal today. Mammogram ordered. PAP and pelvic deferred as normal PAP, HPV neg in 2014. Labs today including CMP, lipids, A1c. Prevnar today. Flu vaccine already completed. Encouraged healthy diet and exercise.

## 2014-01-21 NOTE — Addendum Note (Signed)
Addended by: Vernetta Honey on: 01/21/2014 10:04 AM   Modules accepted: Orders

## 2014-01-21 NOTE — Assessment & Plan Note (Signed)
Will check TSH with labs. 

## 2014-01-21 NOTE — Telephone Encounter (Signed)
Pt called to verify that she was to continue carvedilol (COREG) 6.25 MG tablet along with the new medication she started today losartan (COZAAR) 25 MG tablet

## 2014-01-21 NOTE — Progress Notes (Signed)
Pre visit review using our clinic review tool, if applicable. No additional management support is needed unless otherwise documented below in the visit note. 

## 2014-01-21 NOTE — Assessment & Plan Note (Signed)
Will check A1c with labs today. Continue Metformin. 

## 2014-01-21 NOTE — Assessment & Plan Note (Signed)
BP Readings from Last 3 Encounters:  01/21/14 152/90  09/20/13 132/78  08/22/13 152/77   BP elevated today. Will start losartan 25mg  daily. Recheck Cr and K in 1 week. BP recheck in 4 weeks.

## 2014-01-22 ENCOUNTER — Telehealth: Payer: Self-pay | Admitting: Internal Medicine

## 2014-01-22 NOTE — Telephone Encounter (Signed)
emmi emailed °

## 2014-01-22 NOTE — Telephone Encounter (Signed)
Yes, please continue the new medication with carvedilol

## 2014-01-22 NOTE — Telephone Encounter (Signed)
Called pt, states she had already talked to someone in office and given response.

## 2014-01-28 ENCOUNTER — Telehealth: Payer: Self-pay | Admitting: *Deleted

## 2014-01-28 ENCOUNTER — Other Ambulatory Visit (INDEPENDENT_AMBULATORY_CARE_PROVIDER_SITE_OTHER): Payer: Commercial Managed Care - HMO

## 2014-01-28 DIAGNOSIS — E785 Hyperlipidemia, unspecified: Secondary | ICD-10-CM

## 2014-01-28 DIAGNOSIS — I10 Essential (primary) hypertension: Secondary | ICD-10-CM

## 2014-01-28 DIAGNOSIS — R7989 Other specified abnormal findings of blood chemistry: Secondary | ICD-10-CM

## 2014-01-28 DIAGNOSIS — R945 Abnormal results of liver function studies: Secondary | ICD-10-CM

## 2014-01-28 DIAGNOSIS — E119 Type 2 diabetes mellitus without complications: Secondary | ICD-10-CM

## 2014-01-28 NOTE — Telephone Encounter (Signed)
Is pt here for the a1c, cmet and lipid(future order)? Or a different order, she stated is was for potassium?

## 2014-01-28 NOTE — Telephone Encounter (Signed)
i released the order when the pt came in would you like me to put them back in as future?

## 2014-01-28 NOTE — Telephone Encounter (Signed)
BMP only for hypertension

## 2014-01-28 NOTE — Telephone Encounter (Signed)
Yes please thanks.

## 2014-01-29 LAB — BASIC METABOLIC PANEL
BUN: 14 mg/dL (ref 6–23)
CO2: 31 mEq/L (ref 19–32)
Calcium: 9.6 mg/dL (ref 8.4–10.5)
Chloride: 103 mEq/L (ref 96–112)
Creatinine, Ser: 0.8 mg/dL (ref 0.4–1.2)
GFR: 77.64 mL/min (ref >60.00)
Glucose, Bld: 117 mg/dL — ABNORMAL HIGH (ref 70–99)
Potassium: 3.2 mEq/L — ABNORMAL LOW (ref 3.5–5.1)
Sodium: 140 mEq/L (ref 135–145)

## 2014-02-19 ENCOUNTER — Encounter: Payer: Self-pay | Admitting: *Deleted

## 2014-02-19 ENCOUNTER — Ambulatory Visit: Payer: Self-pay | Admitting: Internal Medicine

## 2014-02-19 LAB — HM MAMMOGRAPHY: HM Mammogram: NEGATIVE

## 2014-03-01 ENCOUNTER — Ambulatory Visit (INDEPENDENT_AMBULATORY_CARE_PROVIDER_SITE_OTHER): Payer: Commercial Managed Care - HMO | Admitting: Internal Medicine

## 2014-03-01 ENCOUNTER — Encounter: Payer: Self-pay | Admitting: Internal Medicine

## 2014-03-01 VITALS — BP 112/74 | HR 82 | Temp 97.9°F | Ht 61.0 in | Wt 151.5 lb

## 2014-03-01 DIAGNOSIS — M19042 Primary osteoarthritis, left hand: Secondary | ICD-10-CM

## 2014-03-01 DIAGNOSIS — I10 Essential (primary) hypertension: Secondary | ICD-10-CM

## 2014-03-01 DIAGNOSIS — M19041 Primary osteoarthritis, right hand: Secondary | ICD-10-CM

## 2014-03-01 NOTE — Patient Instructions (Signed)
Continue current medications.  Use Ibuprofen 800mg  up to every 8 hours as needed for severe pain.

## 2014-03-01 NOTE — Progress Notes (Signed)
   Subjective:    Patient ID: Amber Glenn, female    DOB: [birth year], 63 y.o.   MRN: [id number]  HPI 63YO female presents for follow up.  HTN - Started on Losartan 25mg  daily. No side effects noted from this medication.  Does not check BP.  Wt Readings from Last 3 Encounters:  03/01/14 151 lb 8 oz (68.72 kg)  01/21/14 151 lb 8 oz (68.72 kg)  09/20/13 155 lb 12 oz (70.648 kg)   She continues to have some arthralgias in her hands. 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 Taking Ibuprofen with some improvement.  Review of Systems  Constitutional: Negative for fever, chills, appetite change, fatigue and unexpected weight change.  Eyes: Negative for visual disturbance.  Respiratory: Negative for shortness of breath.   Cardiovascular: Negative for chest pain and leg swelling.  Gastrointestinal: Negative for nausea, vomiting, abdominal pain, diarrhea and constipation.  Musculoskeletal: Positive for arthralgias. Negative for myalgias and back pain.  Skin: Negative for color change and rash.  Neurological: Negative for headaches.  Hematological: Negative for adenopathy. Does not bruise/bleed easily.  Psychiatric/Behavioral: Negative for dysphoric mood. The patient is not nervous/anxious.        Objective:    BP 112/74 mmHg  Pulse 82  Temp(Src) 97.9 F (36.6 C) (Oral)  Ht 5\' 1"  (1.549 m)  Wt 151 lb 8 oz (68.72 kg)  BMI 28.64 kg/m2  SpO2 93% Physical Exam  Constitutional: She is oriented to person, place, and time. She appears well-developed and well-nourished. No distress.  HENT:  Head: Normocephalic and atraumatic.  Right Ear: External ear normal.  Left Ear: External ear normal.  Nose: Nose normal.  Mouth/Throat: Oropharynx is clear and moist. No oropharyngeal exudate.  Eyes: Conjunctivae are normal. Pupils are equal, round, and reactive to light. Right eye exhibits no discharge. Left eye exhibits no discharge. No scleral icterus.  Neck: Normal range of motion. Neck supple.  No tracheal deviation present. No thyromegaly present.  Cardiovascular: Normal rate, regular rhythm, normal heart sounds and intact distal pulses.  Exam reveals no gallop and no friction rub.   No murmur heard. Pulmonary/Chest: Effort normal and breath sounds normal. No accessory muscle usage. No tachypnea. No respiratory distress. She has no decreased breath sounds. She has no wheezes. She has no rhonchi. She has no rales. She exhibits no tenderness.  Musculoskeletal: Normal range of motion. She exhibits no edema or tenderness.  Lymphadenopathy:    She has no cervical adenopathy.  Neurological: She is alert and oriented to person, place, and time. No cranial nerve deficit. She exhibits normal muscle tone. Coordination normal.  Skin: Skin is warm and dry. No rash noted. She is not diaphoretic. No erythema. No pallor.  Psychiatric: She has a normal mood and affect. Her behavior is normal. Judgment and thought content normal.          Assessment & Plan:   Problem List Items Addressed This Visit      Unprioritized   Hypertension - Primary    BP Readings from Last 3 Encounters:  03/01/14 112/74  01/21/14 152/90  09/20/13 132/78   BP well controlled on Losartan and Carvedilol. Will continue.    Osteoarthritis    Encouraged prn use of Ibuprofen or Tylenol. Follow up if symptoms are not improving.        Return in about 2 months (around 05/01/2014) for Recheck of Diabetes.

## 2014-03-01 NOTE — Progress Notes (Signed)
Pre visit review using our clinic review tool, if applicable. No additional management support is needed unless otherwise documented below in the visit note. 

## 2014-03-01 NOTE — Assessment & Plan Note (Signed)
BP Readings from Last 3 Encounters:  03/01/14 112/74  01/21/14 152/90  09/20/13 132/78   BP well controlled on Losartan and Carvedilol. Will continue.

## 2014-03-01 NOTE — Assessment & Plan Note (Signed)
Encouraged prn use of Ibuprofen or Tylenol. Follow up if symptoms are not improving.

## 2014-03-22 ENCOUNTER — Other Ambulatory Visit: Payer: Self-pay | Admitting: Internal Medicine

## 2014-04-24 LAB — HM DIABETES EYE EXAM

## 2014-05-03 ENCOUNTER — Ambulatory Visit: Payer: Commercial Managed Care - HMO | Admitting: Internal Medicine

## 2014-05-08 ENCOUNTER — Encounter: Payer: Self-pay | Admitting: Internal Medicine

## 2014-05-08 ENCOUNTER — Ambulatory Visit (INDEPENDENT_AMBULATORY_CARE_PROVIDER_SITE_OTHER): Payer: Commercial Managed Care - HMO | Admitting: Internal Medicine

## 2014-05-08 VITALS — BP 120/69 | HR 82 | Temp 97.6°F | Ht 61.0 in | Wt 152.5 lb

## 2014-05-08 DIAGNOSIS — M542 Cervicalgia: Secondary | ICD-10-CM | POA: Insufficient documentation

## 2014-05-08 DIAGNOSIS — E785 Hyperlipidemia, unspecified: Secondary | ICD-10-CM

## 2014-05-08 DIAGNOSIS — E119 Type 2 diabetes mellitus without complications: Secondary | ICD-10-CM

## 2014-05-08 DIAGNOSIS — I10 Essential (primary) hypertension: Secondary | ICD-10-CM

## 2014-05-08 LAB — MICROALBUMIN / CREATININE URINE RATIO
Creatinine,U: 41.5 mg/dL
Microalb Creat Ratio: 1.2 mg/g (ref 0.0–30.0)
Microalb, Ur: 0.5 mg/dL (ref 0.0–1.9)

## 2014-05-08 LAB — COMPREHENSIVE METABOLIC PANEL
ALT: 18 U/L (ref 0–35)
AST: 19 U/L (ref 0–37)
Albumin: 3.9 g/dL (ref 3.5–5.2)
Alkaline Phosphatase: 66 U/L (ref 39–117)
BUN: 15 mg/dL (ref 6–23)
CO2: 29 mEq/L (ref 19–32)
Calcium: 9.4 mg/dL (ref 8.4–10.5)
Chloride: 103 mEq/L (ref 96–112)
Creatinine, Ser: 0.9 mg/dL (ref 0.40–1.20)
GFR: 66.74 mL/min (ref >60.00)
Glucose, Bld: 187 mg/dL — ABNORMAL HIGH (ref 70–99)
Potassium: 4.1 mEq/L (ref 3.5–5.1)
Sodium: 140 mEq/L (ref 135–145)
Total Bilirubin: 0.4 mg/dL (ref 0.2–1.2)
Total Protein: 6.7 g/dL (ref 6.0–8.3)

## 2014-05-08 LAB — LIPID PANEL
Cholesterol: 144 mg/dL (ref 0–200)
HDL: 43.6 mg/dL (ref >39.00)
LDL Cholesterol: 75 mg/dL (ref 0–99)
NonHDL: 100.4
Total CHOL/HDL Ratio: 3
Triglycerides: 126 mg/dL (ref 0.0–149.0)
VLDL: 25.2 mg/dL (ref 0.0–40.0)

## 2014-05-08 LAB — HEMOGLOBIN A1C: Hgb A1c MFr Bld: 6.2 % (ref 4.6–6.5)

## 2014-05-08 MED ORDER — CYCLOBENZAPRINE HCL 5 MG PO TABS: 5.0000 mg | ORAL_TABLET | Freq: Three times a day (TID) | ORAL | Status: DC | PRN

## 2014-05-08 NOTE — Assessment & Plan Note (Signed)
Will check A1c with labs today. Continue Metformin. 

## 2014-05-08 NOTE — Assessment & Plan Note (Signed)
Upper back/neck pain most c/w spasm of the trapezius muscle. Discussed some exercises to help with this. Will continue Ibuprofen and start prn Flexeril. Follow up in 3 months and prn.

## 2014-05-08 NOTE — Progress Notes (Signed)
Subjective:    Patient ID: Amber Glenn, female    DOB: 09-19-1950, 64 y.o.   MRN: [id number]  HPI 64YO female presents for follow up.  DM - Has not recently checked blood sugars.  Having some aching upper back pain last few days. Not taking anything except for ibuprofen, 800mg  twice daily. Upper back feels "tight." No injury to back. This pain has been present off and on for several months.   Past medical, surgical, family and social history per today's encounter.  Review of Systems  Constitutional: Negative for fever, chills, appetite change, fatigue and unexpected weight change.  Eyes: Negative for visual disturbance.  Respiratory: Negative for shortness of breath.   Cardiovascular: Negative for chest pain and leg swelling.  Gastrointestinal: Negative for nausea, vomiting, abdominal pain, diarrhea and constipation.  Musculoskeletal: Positive for myalgias, back pain, arthralgias and neck pain. Negative for neck stiffness.  Skin: Negative for color change and rash.  Hematological: Negative for adenopathy. Does not bruise/bleed easily.  Psychiatric/Behavioral: Negative for sleep disturbance and dysphoric mood. The patient is not nervous/anxious.        Objective:    BP 120/69 mmHg  Pulse 82  Temp(Src) 97.6 F (36.4 C) (Oral)  Ht 5\' 1"  (1.549 m)  Wt 152 lb 8 oz (69.174 kg)  BMI 28.83 kg/m2  SpO2 96% Physical Exam  Constitutional: She is oriented to person, place, and time. She appears well-developed and well-nourished. No distress.  HENT:  Head: Normocephalic and atraumatic.  Right Ear: External ear normal.  Left Ear: External ear normal.  Nose: Nose normal.  Mouth/Throat: Oropharynx is clear and moist. No oropharyngeal exudate.  Eyes: Conjunctivae are normal. Pupils are equal, round, and reactive to light. Right eye exhibits no discharge. Left eye exhibits no discharge. No scleral icterus.  Neck: Normal range of motion. Neck supple. Muscular tenderness (trapezius)  present. No spinous process tenderness present. No rigidity. No tracheal deviation present. No thyromegaly present.  Cardiovascular: Normal rate, regular rhythm, normal heart sounds and intact distal pulses.  Exam reveals no gallop and no friction rub.   No murmur heard. Pulmonary/Chest: Effort normal and breath sounds normal. No accessory muscle usage. No tachypnea. No respiratory distress. She has no decreased breath sounds. She has no wheezes. She has no rhonchi. She has no rales. She exhibits no tenderness.  Musculoskeletal: Normal range of motion. She exhibits no edema or tenderness.  Lymphadenopathy:    She has no cervical adenopathy.  Neurological: She is alert and oriented to person, place, and time. No cranial nerve deficit. She exhibits normal muscle tone. Coordination normal.  Skin: Skin is warm and dry. No rash noted. She is not diaphoretic. No erythema. No pallor.  Psychiatric: She has a normal mood and affect. Her behavior is normal. Judgment and thought content normal.          Assessment & Plan:   Problem List Items Addressed This Visit      Unprioritized   Diabetes mellitus type 2, controlled - Primary    Will check A1c with labs today. Continue Metformin.      Relevant Orders   Comprehensive metabolic panel   Hemoglobin A1c   Lipid panel   Microalbumin / creatinine urine ratio   Hyperlipidemia    Will check lipids and LFTs  with labs today. Continue Simvastatin.      Hypertension    BP Readings from Last 3 Encounters:  05/08/14 120/69  03/01/14 112/74  01/21/14 152/90   BP well  controlled. Will continue current medications. Renal function with labs today.      Neck pain    Upper back/neck pain most c/w spasm of the trapezius muscle. Discussed some exercises to help with this. Will continue Ibuprofen and start prn Flexeril. Follow up in 3 months and prn.          Return in about 3 months (around 08/07/2014) for Recheck of Diabetes.

## 2014-05-08 NOTE — Patient Instructions (Addendum)
Start Flexeril 5mg  up to three times per day to help with back pain. Note that this medication may make you drowsy.  Labs today.  Follow up in 3 months.

## 2014-05-08 NOTE — Assessment & Plan Note (Addendum)
Will check lipids and LFTs  with labs today. Continue Simvastatin.

## 2014-05-08 NOTE — Progress Notes (Signed)
Pre visit review using our clinic review tool, if applicable. No additional management support is needed unless otherwise documented below in the visit note. 

## 2014-05-08 NOTE — Assessment & Plan Note (Signed)
BP Readings from Last 3 Encounters:  05/08/14 120/69  03/01/14 112/74  01/21/14 152/90   BP well controlled. Will continue current medications. Renal function with labs today.

## 2014-05-15 ENCOUNTER — Encounter: Payer: Self-pay | Admitting: Internal Medicine

## 2014-05-28 ENCOUNTER — Other Ambulatory Visit: Payer: Self-pay | Admitting: Internal Medicine

## 2014-05-29 ENCOUNTER — Other Ambulatory Visit: Payer: Self-pay | Admitting: Internal Medicine

## 2014-05-29 MED ORDER — GLUCOSE BLOOD VI STRP: ORAL_STRIP | Status: DC

## 2014-06-11 ENCOUNTER — Other Ambulatory Visit: Payer: Self-pay | Admitting: *Deleted

## 2014-06-17 ENCOUNTER — Other Ambulatory Visit: Payer: Self-pay | Admitting: *Deleted

## 2014-06-17 MED ORDER — GLUCOSE BLOOD VI STRP
ORAL_STRIP | Status: DC
Start: 2014-06-17 — End: 2022-02-24

## 2014-06-17 MED ORDER — TRUE METRIX AIR GLUCOSE METER DEVI
1.0000 | Freq: Every day | Status: DC
Start: 2014-06-17 — End: 2022-02-24

## 2014-06-17 MED ORDER — TRUEPLUS LANCETS 28G MISC: Status: DC

## 2014-06-17 NOTE — Telephone Encounter (Signed)
Fax from [place], needing Rxs. Per Almyra Free, verified with pt that she needs this brand of diabetic supplies per her insurance. Rx sent to pharmacy by escript

## 2014-08-08 ENCOUNTER — Ambulatory Visit: Payer: Commercial Managed Care - HMO | Admitting: Internal Medicine

## 2014-09-02 ENCOUNTER — Encounter: Payer: Self-pay | Admitting: Internal Medicine

## 2014-09-02 ENCOUNTER — Ambulatory Visit (INDEPENDENT_AMBULATORY_CARE_PROVIDER_SITE_OTHER): Payer: Commercial Managed Care - HMO | Admitting: Internal Medicine

## 2014-09-02 VITALS — BP 110/64 | HR 80 | Temp 97.4°F | Ht 61.0 in | Wt 150.2 lb

## 2014-09-02 DIAGNOSIS — I10 Essential (primary) hypertension: Secondary | ICD-10-CM | POA: Diagnosis not present

## 2014-09-02 DIAGNOSIS — E119 Type 2 diabetes mellitus without complications: Secondary | ICD-10-CM

## 2014-09-02 DIAGNOSIS — E785 Hyperlipidemia, unspecified: Secondary | ICD-10-CM

## 2014-09-02 DIAGNOSIS — M2550 Pain in unspecified joint: Secondary | ICD-10-CM | POA: Insufficient documentation

## 2014-09-02 LAB — COMPREHENSIVE METABOLIC PANEL
ALT: 20 U/L (ref 0–35)
AST: 21 U/L (ref 0–37)
Albumin: 4.3 g/dL (ref 3.5–5.2)
Alkaline Phosphatase: 72 U/L (ref 39–117)
BUN: 11 mg/dL (ref 6–23)
CO2: 29 mEq/L (ref 19–32)
Calcium: 9.3 mg/dL (ref 8.4–10.5)
Chloride: 102 mEq/L (ref 96–112)
Creatinine, Ser: 0.79 mg/dL (ref 0.40–1.20)
GFR: 77.49 mL/min (ref >60.00)
Glucose, Bld: 156 mg/dL — ABNORMAL HIGH (ref 70–99)
Potassium: 3.8 mEq/L (ref 3.5–5.1)
Sodium: 139 mEq/L (ref 135–145)
Total Bilirubin: 0.4 mg/dL (ref 0.2–1.2)
Total Protein: 7.2 g/dL (ref 6.0–8.3)

## 2014-09-02 LAB — LIPID PANEL
Cholesterol: 166 mg/dL (ref 0–200)
HDL: 46.6 mg/dL (ref >39.00)
LDL Cholesterol: 83 mg/dL (ref 0–99)
NonHDL: 119.4
Total CHOL/HDL Ratio: 4
Triglycerides: 182 mg/dL — ABNORMAL HIGH (ref 0.0–149.0)
VLDL: 36.4 mg/dL (ref 0.0–40.0)

## 2014-09-02 LAB — MICROALBUMIN / CREATININE URINE RATIO
Creatinine,U: 29.6 mg/dL
Microalb Creat Ratio: 2.4 mg/g (ref 0.0–30.0)
Microalb, Ur: <0.7 mg/dL (ref 0.0–1.9)

## 2014-09-02 LAB — HEMOGLOBIN A1C: Hgb A1c MFr Bld: 6 % (ref 4.6–6.5)

## 2014-09-02 MED ORDER — MELOXICAM 15 MG PO TABS: 15.0000 mg | ORAL_TABLET | Freq: Every day | ORAL | Status: DC

## 2014-09-02 NOTE — Assessment & Plan Note (Signed)
BP Readings from Last 3 Encounters:  09/02/14 110/64  05/08/14 120/69  03/01/14 112/74   BP well controlled. Continue current medications. Renal function with labs.

## 2014-09-02 NOTE — Patient Instructions (Signed)
Start Meloxicam '15mg'$  daily to help with joint pain. Stop Ibuprofen.

## 2014-09-02 NOTE — Assessment & Plan Note (Signed)
Arthralgia secondary to OA. Will try changing to Meloxicam. Discussed potential risks of this medication. Follow up prn and in 6 months.

## 2014-09-02 NOTE — Assessment & Plan Note (Signed)
BG well controlled. Continue Metformin. Check A1c with labs.

## 2014-09-02 NOTE — Progress Notes (Signed)
Subjective:    Patient ID: Amber Glenn, female    DOB: 1950/08/09, 64 y.o.   MRN: [id number]  HPI  64YO female presents for follow up.  DM - BG mostly 120-130s. Compliant with medications.   HTN - BP typically 749S systolic. Compliant with medications. No CP, HA, palpitations.  Recently has had increased joint pain in hands bilaterally. Described as aching and stiffness. Uses hands daily at work. Taking Ibuprofen with minimal improvement.  Wt Readings from Last 3 Encounters:  09/02/14 150 lb 4 oz (68.153 kg)  05/08/14 152 lb 8 oz (69.174 kg)  03/01/14 151 lb 8 oz (68.72 kg)    Past medical, surgical, family and social history per today's encounter.  Review of Systems  Constitutional: Negative for fever, chills, appetite change, fatigue and unexpected weight change.  Eyes: Negative for visual disturbance.  Respiratory: Negative for shortness of breath.   Cardiovascular: Negative for chest pain, palpitations and leg swelling.  Gastrointestinal: Negative for abdominal pain, diarrhea and constipation.  Skin: Negative for color change and rash.  Neurological: Negative for headaches.  Hematological: Negative for adenopathy. Does not bruise/bleed easily.  Psychiatric/Behavioral: Negative for dysphoric mood. The patient is not nervous/anxious.        Objective:    BP 110/64 mmHg  Pulse 80  Temp(Src) 97.4 F (36.3 C) (Oral)  Ht '5\' 1"'$  (1.549 m)  Wt 150 lb 4 oz (68.153 kg)  BMI 28.40 kg/m2  SpO2 95% Physical Exam  Constitutional: She is oriented to person, place, and time. She appears well-developed and well-nourished. No distress.  HENT:  Head: Normocephalic and atraumatic.  Right Ear: External ear normal.  Left Ear: External ear normal.  Nose: Nose normal.  Mouth/Throat: Oropharynx is clear and moist. No oropharyngeal exudate.  Eyes: Conjunctivae are normal. Pupils are equal, round, and reactive to light. Right eye exhibits no discharge. Left eye exhibits no  discharge. No scleral icterus.  Neck: Normal range of motion. Neck supple. No tracheal deviation present. No thyromegaly present.  Cardiovascular: Normal rate, regular rhythm, normal heart sounds and intact distal pulses.  Exam reveals no gallop and no friction rub.   No murmur heard. Pulmonary/Chest: Effort normal and breath sounds normal. No respiratory distress. She has no wheezes. She has no rales. She exhibits no tenderness.  Musculoskeletal: Normal range of motion. She exhibits no edema or tenderness.  Lymphadenopathy:    She has no cervical adenopathy.  Neurological: She is alert and oriented to person, place, and time. No cranial nerve deficit. She exhibits normal muscle tone. Coordination normal.  Skin: Skin is warm and dry. No rash noted. She is not diaphoretic. No erythema. No pallor.  Psychiatric: She has a normal mood and affect. Her behavior is normal. Judgment and thought content normal.          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 Will try changing to Meloxicam. Discussed potential risks of this medication. Follow up prn and in 6 months.      Diabetes mellitus type 2, controlled - Primary    BG well controlled. Continue Metformin. Check A1c with labs.      Relevant Orders   Comprehensive metabolic panel   Hemoglobin A1c   Microalbumin / creatinine urine ratio   Hyperlipidemia    Will check lipids with labs. Continue Simvastatin.      Relevant Orders   Lipid  panel   Hypertension    BP Readings from Last 3 Encounters:  09/02/14 110/64  05/08/14 120/69  03/01/14 112/74   BP well controlled. Continue current medications. Renal function with labs.          Return in about 6 months (around 03/05/2015) for Wellness Visit.

## 2014-09-02 NOTE — Progress Notes (Signed)
Pre visit review using our clinic review tool, if applicable. No additional management support is needed unless otherwise documented below in the visit note. 

## 2014-09-02 NOTE — Assessment & Plan Note (Signed)
Will check lipids with labs. Continue Simvastatin.

## 2014-09-30 ENCOUNTER — Other Ambulatory Visit: Payer: Self-pay | Admitting: Internal Medicine

## 2014-12-18 ENCOUNTER — Other Ambulatory Visit: Payer: Self-pay | Admitting: Internal Medicine

## 2014-12-19 ENCOUNTER — Other Ambulatory Visit: Payer: Self-pay | Admitting: Internal Medicine

## 2014-12-20 ENCOUNTER — Other Ambulatory Visit: Payer: Self-pay

## 2014-12-20 ENCOUNTER — Telehealth: Payer: Self-pay

## 2014-12-20 ENCOUNTER — Other Ambulatory Visit: Payer: Self-pay | Admitting: Internal Medicine

## 2014-12-20 MED ORDER — CARVEDILOL 6.25 MG PO TABS: 6.2500 mg | ORAL_TABLET | Freq: Two times a day (BID) | ORAL | Status: DC

## 2014-12-20 MED ORDER — SIMVASTATIN 40 MG PO TABS: ORAL_TABLET | ORAL | Status: DC

## 2014-12-20 MED ORDER — FUROSEMIDE 40 MG PO TABS: 40.0000 mg | ORAL_TABLET | Freq: Every day | ORAL | Status: DC

## 2014-12-20 MED ORDER — MELOXICAM 15 MG PO TABS: 15.0000 mg | ORAL_TABLET | Freq: Every day | ORAL | Status: DC

## 2014-12-20 MED ORDER — OMEPRAZOLE 20 MG PO CPDR: 20.0000 mg | DELAYED_RELEASE_CAPSULE | Freq: Every day | ORAL | Status: DC

## 2014-12-20 NOTE — Telephone Encounter (Signed)
Pt was called to let her know her medication was sent in.

## 2014-12-20 NOTE — Telephone Encounter (Signed)
Pt needed med refills on her medication and they need to be sent to [facility].

## 2014-12-23 ENCOUNTER — Other Ambulatory Visit: Payer: Self-pay | Admitting: Internal Medicine

## 2015-03-05 ENCOUNTER — Ambulatory Visit: Payer: Commercial Managed Care - HMO | Admitting: Internal Medicine

## 2015-03-11 ENCOUNTER — Other Ambulatory Visit: Payer: Self-pay | Admitting: Internal Medicine

## 2015-03-14 ENCOUNTER — Encounter: Payer: Commercial Managed Care - HMO | Admitting: Internal Medicine

## 2015-03-14 ENCOUNTER — Encounter: Payer: Self-pay | Admitting: Internal Medicine

## 2015-03-14 ENCOUNTER — Ambulatory Visit (INDEPENDENT_AMBULATORY_CARE_PROVIDER_SITE_OTHER): Payer: Commercial Managed Care - HMO | Admitting: Internal Medicine

## 2015-03-14 VITALS — BP 135/70 | HR 81 | Temp 97.6°F | Ht 60.5 in | Wt 149.2 lb

## 2015-03-14 DIAGNOSIS — Z1239 Encounter for other screening for malignant neoplasm of breast: Secondary | ICD-10-CM

## 2015-03-14 DIAGNOSIS — Z0000 Encounter for general adult medical examination without abnormal findings: Secondary | ICD-10-CM

## 2015-03-14 DIAGNOSIS — E119 Type 2 diabetes mellitus without complications: Secondary | ICD-10-CM | POA: Diagnosis not present

## 2015-03-14 DIAGNOSIS — I10 Essential (primary) hypertension: Secondary | ICD-10-CM | POA: Diagnosis not present

## 2015-03-14 DIAGNOSIS — Z780 Asymptomatic menopausal state: Secondary | ICD-10-CM

## 2015-03-14 LAB — LIPID PANEL
Cholesterol: 173 mg/dL (ref 0–200)
HDL: 47.3 mg/dL (ref >39.00)
LDL Cholesterol: 101 mg/dL — ABNORMAL HIGH (ref 0–99)
NonHDL: 125.98
Total CHOL/HDL Ratio: 4
Triglycerides: 123 mg/dL (ref 0.0–149.0)
VLDL: 24.6 mg/dL (ref 0.0–40.0)

## 2015-03-14 LAB — COMPREHENSIVE METABOLIC PANEL
ALT: 21 U/L (ref 0–35)
AST: 21 U/L (ref 0–37)
Albumin: 4.2 g/dL (ref 3.5–5.2)
Alkaline Phosphatase: 76 U/L (ref 39–117)
BUN: 17 mg/dL (ref 6–23)
CO2: 32 mEq/L (ref 19–32)
Calcium: 9.6 mg/dL (ref 8.4–10.5)
Chloride: 101 mEq/L (ref 96–112)
Creatinine, Ser: 0.84 mg/dL (ref 0.40–1.20)
GFR: 72.08 mL/min (ref >60.00)
Glucose, Bld: 128 mg/dL — ABNORMAL HIGH (ref 70–99)
Potassium: 3.9 mEq/L (ref 3.5–5.1)
Sodium: 142 mEq/L (ref 135–145)
Total Bilirubin: 0.4 mg/dL (ref 0.2–1.2)
Total Protein: 7.3 g/dL (ref 6.0–8.3)

## 2015-03-14 LAB — HEMOGLOBIN A1C: Hgb A1c MFr Bld: 6.2 % (ref 4.6–6.5)

## 2015-03-14 LAB — MICROALBUMIN / CREATININE URINE RATIO
Creatinine,U: 49.3 mg/dL
Microalb Creat Ratio: 3.7 mg/g (ref 0.0–30.0)
Microalb, Ur: 1.8 mg/dL (ref 0.0–1.9)

## 2015-03-14 NOTE — Assessment & Plan Note (Signed)
General medical exam normal today including breast exam. PAP and pelvic deferred as PAP normal 2014, HPV neg.Encouraged healthy diet and exercise. Labs ordered.

## 2015-03-14 NOTE — Progress Notes (Addendum)
Subjective:    Patient ID: Amber Glenn, female    DOB: November 26, 1950, 64 y.o.   MRN: [id number]  HPI  The patient is here for annual Medicare Wellness Examination and management of other chronic and acute problems.   The risk factors are reflected in the history.  The roster of all physicians providing medical care to patient - is listed in the Snapshot section of the chart.  Activities of daily living:   The patient is 100% independent in all ADLs: dressing, toileting, feeding as well as independent mobility. Patient lives with daughter and 3 children. Dog in home. Lives in 1 story home.  Home safety :  The patient has smoke detectors in the home.  They wear seatbelts in their car. There are no firearms at home.  There is no violence in the home. They feel safe where they live.  Infectious Risks: There is no risks for hepatitis, STDs or HIV.  There is no  history of blood transfusion.  They have no travel history to infectious disease endemic areas of the world.  Additional Health Care Providers: The patient has seen their dentist in the last six months. Dentist - Dr. Rudi Heap They have seen their eye doctor in the last year. Opthalmologist - Dr. Chong Sicilian They deny hearing issues. They have deferred audiologic testing in the last year.   They do not  have excessive sun exposure. Discussed the need for sun protection: hats,long sleeves and use of sunscreen if there is significant sun exposure.  Dermatologist - Dr. Evorn Gong  Diet: the importance of a healthy diet is discussed. They do have a healthy diet.  The benefits of regular aerobic exercise were discussed. Patient exercises by walking.  Depression screen: there are no signs or vegative symptoms of depression- irritability, change in appetite, anhedonia, sadness/tearfullness.  Cognitive assessment: the patient manages all their financial and personal affairs and is actively engaged. They could relate day,date,year and  events.  HCPOA - none in place  The following portions of the patient's history were reviewed and updated as appropriate: allergies, current medications, past family history, past medical history,  past surgical history, past social history and problem list.  Visual acuity was not assessed per patient preference as they have regular follow up with their ophthalmologist. Hearing and body mass index were assessed and reviewed.   During the course of the visit the patient was educated and counseled about appropriate screening and preventive services including : fall prevention , diabetes screening, nutrition counseling, colorectal cancer screening, and recommended immunizations.    DM - BG well controlled generally. Typically near 119-120.  Compliant with medication.  Wt Readings from Last 3 Encounters:  03/14/15 149 lb 4 oz (67.699 kg)  09/02/14 150 lb 4 oz (68.153 kg)  05/08/14 152 lb 8 oz (69.174 kg)   BP Readings from Last 3 Encounters:  03/14/15 135/70  09/02/14 110/64  05/08/14 120/69    Past Medical History  Diagnosis Date  . Hyperlipidemia   . Diabetes mellitus   . HTN (hypertension)   . Cataract     right  . Post corneal transplant     right   Family History  Problem Relation Age of Onset  . Heart disease Mother   . Heart disease Father   . Heart disease Maternal Grandmother   . Heart disease Maternal Grandfather    Past Surgical History  Procedure Laterality Date  . Carpal tunnel release      right  .  Skin cancer excision    . Basal cell carcinoma excision      Dr. Manley Mason   Social History   Social History  . Marital Status: Widowed    Spouse Name: N/A  . Number of Children: N/A  . Years of Education: N/A   Occupational History  .  [employer]    Currently not employed   Social History Main Topics  . Smoking status: Former Smoker    Quit date: 01/13/2009  . Smokeless tobacco: Never Used  . Alcohol Use: No  . Drug Use: No  . Sexual Activity: Yes     Birth Control/ Protection: Post-menopausal   Other Topics Concern  . None   Social History Narrative    Review of Systems  Constitutional: Negative for fever, chills, appetite change, fatigue and unexpected weight change.  HENT: Negative for congestion, postnasal drip, rhinorrhea and sinus pressure.   Eyes: Negative for visual disturbance.  Respiratory: Negative for shortness of breath.   Cardiovascular: Negative for chest pain and leg swelling.  Gastrointestinal: Negative for nausea, vomiting, abdominal pain, diarrhea and constipation.  Musculoskeletal: Negative for myalgias and arthralgias.  Skin: Negative for color change and rash.  Hematological: Negative for adenopathy. Does not bruise/bleed easily.  Psychiatric/Behavioral: Negative for sleep disturbance and dysphoric mood. The patient is not nervous/anxious.        Objective:    BP 135/70 mmHg  Pulse 81  Temp(Src) 97.6 F (36.4 C) (Oral)  Ht 5' 0.5" (1.537 m)  Wt 149 lb 4 oz (67.699 kg)  BMI 28.66 kg/m2  SpO2 96% Physical Exam  Constitutional: She is oriented to person, place, and time. She appears well-developed and well-nourished. No distress.  HENT:  Head: Normocephalic and atraumatic.  Right Ear: External ear normal.  Left Ear: External ear normal.  Nose: Nose normal.  Mouth/Throat: Oropharynx is clear and moist. No oropharyngeal exudate.  Eyes: Conjunctivae are normal. Pupils are equal, round, and reactive to light. Right eye exhibits no discharge. Left eye exhibits no discharge. No scleral icterus.  Neck: Normal range of motion. Neck supple. No tracheal deviation present. No thyromegaly present.  Cardiovascular: Normal rate, regular rhythm, normal heart sounds and intact distal pulses.  Exam reveals no gallop and no friction rub.   No murmur heard. Pulmonary/Chest: Effort normal and breath sounds normal. No accessory muscle usage. No tachypnea. No respiratory distress. She has no decreased breath sounds. She  has no wheezes. She has no rales. She exhibits no tenderness. Right breast exhibits no inverted nipple, no mass, no nipple discharge, no skin change and no tenderness. Left breast exhibits no inverted nipple, no mass, no nipple discharge, no skin change and no tenderness. Breasts are symmetrical.  Abdominal: Soft. Bowel sounds are normal. She exhibits no distension and no mass. There is no tenderness. There is no rebound and no guarding.  Musculoskeletal: Normal range of motion. She exhibits no edema or tenderness.  Lymphadenopathy:    She has no cervical adenopathy.  Neurological: She is alert and oriented to person, place, and time. No cranial nerve deficit. She exhibits normal muscle tone. Coordination normal.  Skin: Skin is warm and dry. No rash noted. She is not diaphoretic. No erythema. No pallor.  Psychiatric: She has a normal mood and affect. Her behavior is normal. Judgment and thought content normal.          Assessment & Plan:  Patient was given a handout regarding current recommendations for health maintenance and preventative care on the AVS.  Problem List Items Addressed This Visit      Unprioritized   Diabetes mellitus type 2, controlled ([place])   Relevant Orders   Comprehensive metabolic panel   Hemoglobin A1c   Lipid panel   Microalbumin / creatinine urine ratio   Hypertension   Medicare annual wellness visit, subsequent - Primary    Medicare Questionaire reviewed w/ patient, Mammogram ordered. Bone density testing ordered. Colonoscopy due next May 2017       Routine general medical examination at a health care facility    General medical exam normal today including breast exam. PAP and pelvic deferred as PAP normal 2014, HPV neg.Encouraged healthy diet and exercise. Labs ordered.        Other Visit Diagnoses    Screening for breast cancer        Relevant Orders    MM Digital Screening    Postmenopausal estrogen deficiency        Relevant Orders    DG Bone  Density        Return in about 6 months (around 09/12/2015) for Recheck of Diabetes.

## 2015-03-14 NOTE — Assessment & Plan Note (Addendum)
Medicare Questionaire reviewed w/ patient, Mammogram ordered. Bone density testing ordered. Colonoscopy due next May 2017

## 2015-03-14 NOTE — Patient Instructions (Signed)
Health Maintenance, Female Adopting a healthy lifestyle and getting preventive care can go a long way to promote health and wellness. Talk with your health care provider about what schedule of regular examinations is right for you. This is a good chance for you to check in with your provider about disease prevention and staying healthy. In between checkups, there are plenty of things you can do on your own. Experts have done a lot of research about which lifestyle changes and preventive measures are most likely to keep you healthy. Ask your health care provider for more information. WEIGHT AND DIET  Eat a healthy diet  Be sure to include plenty of vegetables, fruits, low-fat dairy products, and lean protein.  Do not eat a lot of foods high in solid fats, added sugars, or salt.  Get regular exercise. This is one of the most important things you can do for your health.  Most adults should exercise for at least 150 minutes each week. The exercise should increase your heart rate and make you sweat (moderate-intensity exercise).  Most adults should also do strengthening exercises at least twice a week. This is in addition to the moderate-intensity exercise.  Maintain a healthy weight  Body mass index (BMI) is a measurement that can be used to identify possible weight problems. It estimates body fat based on height and weight. Your health care provider can help determine your BMI and help you achieve or maintain a healthy weight.  For females 20 years of age and older:   A BMI below 18.5 is considered underweight.  A BMI of 18.5 to 24.9 is normal.  A BMI of 25 to 29.9 is considered overweight.  A BMI of 30 and above is considered obese.  Watch levels of cholesterol and blood lipids  You should start having your blood tested for lipids and cholesterol at 66 years of age, then have this test every 5 years.  You may need to have your cholesterol levels checked more often if:  Your lipid  or cholesterol levels are high.  You are older than 66 years of age.  You are at high risk for heart disease.  CANCER SCREENING   Lung Cancer  Lung cancer screening is recommended for adults 55-80 years old who are at high risk for lung cancer because of a history of smoking.  A yearly low-dose CT scan of the lungs is recommended for people who:  Currently smoke.  Have quit within the past 15 years.  Have at least a 30-pack-year history of smoking. A pack year is smoking an average of one pack of cigarettes a day for 1 year.  Yearly screening should continue until it has been 15 years since you quit.  Yearly screening should stop if you develop a health problem that would prevent you from having lung cancer treatment.  Breast Cancer  Practice breast self-awareness. This means understanding how your breasts normally appear and feel.  It also means doing regular breast self-exams. Let your health care provider know about any changes, no matter how small.  If you are in your 20s or 30s, you should have a clinical breast exam (CBE) by a health care provider every 1-3 years as part of a regular health exam.  If you are 40 or older, have a CBE every year. Also consider having a breast X-ray (mammogram) every year.  If you have a family history of breast cancer, talk to your health care provider about genetic screening.  If you   are at high risk for breast cancer, talk to your health care provider about having an MRI and a mammogram every year.  Breast cancer gene (BRCA) assessment is recommended for women who have family members with BRCA-related cancers. BRCA-related cancers include:  Breast.  Ovarian.  Tubal.  Peritoneal cancers.  Results of the assessment will determine the need for genetic counseling and BRCA1 and BRCA2 testing. Cervical Cancer Your health care provider may recommend that you be screened regularly for cancer of the pelvic organs (ovaries, uterus, and  vagina). This screening involves a pelvic examination, including checking for microscopic changes to the surface of your cervix (Pap test). You may be encouraged to have this screening done every 3 years, beginning at age 21.  For women ages 30-65, health care providers may recommend pelvic exams and Pap testing every 3 years, or they may recommend the Pap and pelvic exam, combined with testing for human papilloma virus (HPV), every 5 years. Some types of HPV increase your risk of cervical cancer. Testing for HPV may also be done on women of any age with unclear Pap test results.  Other health care providers may not recommend any screening for nonpregnant women who are considered low risk for pelvic cancer and who do not have symptoms. Ask your health care provider if a screening pelvic exam is right for you.  If you have had past treatment for cervical cancer or a condition that could lead to cancer, you need Pap tests and screening for cancer for at least 20 years after your treatment. If Pap tests have been discontinued, your risk factors (such as having a new sexual partner) need to be reassessed to determine if screening should resume. Some women have medical problems that increase the chance of getting cervical cancer. In these cases, your health care provider may recommend more frequent screening and Pap tests. Colorectal Cancer  This type of cancer can be detected and often prevented.  Routine colorectal cancer screening usually begins at 66 years of age and continues through 66 years of age.  Your health care provider may recommend screening at an earlier age if you have risk factors for colon cancer.  Your health care provider may also recommend using home test kits to check for hidden blood in the stool.  A small camera at the end of a tube can be used to examine your colon directly (sigmoidoscopy or colonoscopy). This is done to check for the earliest forms of colorectal  cancer.  Routine screening usually begins at age 50.  Direct examination of the colon should be repeated every 5-10 years through 66 years of age. However, you may need to be screened more often if early forms of precancerous polyps or small growths are found. Skin Cancer  Check your skin from head to toe regularly.  Tell your health care provider about any new moles or changes in moles, especially if there is a change in a mole's shape or color.  Also tell your health care provider if you have a mole that is larger than the size of a pencil eraser.  Always use sunscreen. Apply sunscreen liberally and repeatedly throughout the day.  Protect yourself by wearing long sleeves, pants, a wide-brimmed hat, and sunglasses whenever you are outside. HEART DISEASE, DIABETES, AND HIGH BLOOD PRESSURE   High blood pressure causes heart disease and increases the risk of stroke. High blood pressure is more likely to develop in:  People who have blood pressure in the high end   of the normal range (130-139/85-89 mm Hg).  People who are overweight or obese.  People who are African American.  If you are 38-23 years of age, have your blood pressure checked every 3-5 years. If you are 61 years of age or older, have your blood pressure checked every year. You should have your blood pressure measured twice--once when you are at a hospital or clinic, and once when you are not at a hospital or clinic. Record the average of the two measurements. To check your blood pressure when you are not at a hospital or clinic, you can use:  An automated blood pressure machine at a pharmacy.  A home blood pressure monitor.  If you are between 45 years and 39 years old, ask your health care provider if you should take aspirin to prevent strokes.  Have regular diabetes screenings. This involves taking a blood sample to check your fasting blood sugar level.  If you are at a normal weight and have a low risk for diabetes,  have this test once every three years after 66 years of age.  If you are overweight and have a high risk for diabetes, consider being tested at a younger age or more often. PREVENTING INFECTION  Hepatitis B  If you have a higher risk for hepatitis B, you should be screened for this virus. You are considered at high risk for hepatitis B if:  You were born in a country where hepatitis B is common. Ask your health care provider which countries are considered high risk.  Your parents were born in a high-risk country, and you have not been immunized against hepatitis B (hepatitis B vaccine).  You have HIV or AIDS.  You use needles to inject street drugs.  You live with someone who has hepatitis B.  You have had sex with someone who has hepatitis B.  You get hemodialysis treatment.  You take certain medicines for conditions, including cancer, organ transplantation, and autoimmune conditions. Hepatitis C  Blood testing is recommended for:  Everyone born from [birth year] through 1967.  Anyone with known risk factors for hepatitis C. Sexually transmitted infections (STIs)  You should be screened for sexually transmitted infections (STIs) including gonorrhea and chlamydia if:  You are sexually active and are younger than 66 years of age.  You are older than 66 years of age and your health care provider tells you that you are at risk for this type of infection.  Your sexual activity has changed since you were last screened and you are at an increased risk for chlamydia or gonorrhea. Ask your health care provider if you are at risk.  If you do not have HIV, but are at risk, it may be recommended that you take a prescription medicine daily to prevent HIV infection. This is called pre-exposure prophylaxis (PrEP). You are considered at risk if:  You are sexually active and do not regularly use condoms or know the HIV status of your partner(s).  You take drugs by injection.  You are sexually  active with a partner who has HIV. Talk with your health care provider about whether you are at high risk of being infected with HIV. If you choose to begin PrEP, you should first be tested for HIV. You should then be tested every 3 months for as long as you are taking PrEP.  PREGNANCY   If you are premenopausal and you may become pregnant, ask your health care provider about preconception counseling.  If you may  become pregnant, take 400 to 800 micrograms (mcg) of folic acid every day.  If you want to prevent pregnancy, talk to your health care provider about birth control (contraception). OSTEOPOROSIS AND MENOPAUSE   Osteoporosis is a disease in which the bones lose minerals and strength with aging. This can result in serious bone fractures. Your risk for osteoporosis can be identified using a bone density scan.  If you are 61 years of age or older, or if you are at risk for osteoporosis and fractures, ask your health care provider if you should be screened.  Ask your health care provider whether you should take a calcium or vitamin D supplement to lower your risk for osteoporosis.  Menopause may have certain physical symptoms and risks.  Hormone replacement therapy may reduce some of these symptoms and risks. Talk to your health care provider about whether hormone replacement therapy is right for you.  HOME CARE INSTRUCTIONS   Schedule regular health, dental, and eye exams.  Stay current with your immunizations.   Do not use any tobacco products including cigarettes, chewing tobacco, or electronic cigarettes.  If you are pregnant, do not drink alcohol.  If you are breastfeeding, limit how much and how often you drink alcohol.  Limit alcohol intake to no more than 1 drink per day for nonpregnant women. One drink equals 12 ounces of beer, 5 ounces of wine, or 1 ounces of hard liquor.  Do not use street drugs.  Do not share needles.  Ask your health care provider for help if  you need support or information about quitting drugs.  Tell your health care provider if you often feel depressed.  Tell your health care provider if you have ever been abused or do not feel safe at home.   This information is not intended to replace advice given to you by your health care provider. Make sure you discuss any questions you have with your health care provider.   Document Released: 10/12/2010 Document Revised: 04/19/2014 Document Reviewed: 02/28/2013 Elsevier Interactive Patient Education [employer].

## 2015-03-14 NOTE — Progress Notes (Signed)
Pre visit review using our clinic review tool, if applicable. No additional management support is needed unless otherwise documented below in the visit note. 

## 2015-04-10 ENCOUNTER — Encounter: Payer: Self-pay | Admitting: Internal Medicine

## 2015-04-28 LAB — HM DEXA SCAN: HM Dexa Scan: NORMAL

## 2015-04-30 ENCOUNTER — Ambulatory Visit
Admission: RE | Admit: 2015-04-30 | Discharge: 2015-04-30 | Disposition: A | Discharge status: Home / Self Care | Payer: Commercial Managed Care - HMO | Source: Ambulatory Visit | Attending: Internal Medicine | Admitting: Internal Medicine

## 2015-04-30 DIAGNOSIS — Z780 Asymptomatic menopausal state: Secondary | ICD-10-CM | POA: Diagnosis present

## 2015-04-30 DIAGNOSIS — Z1239 Encounter for other screening for malignant neoplasm of breast: Secondary | ICD-10-CM

## 2015-04-30 DIAGNOSIS — Z1231 Encounter for screening mammogram for malignant neoplasm of breast: Secondary | ICD-10-CM | POA: Insufficient documentation

## 2015-05-27 ENCOUNTER — Other Ambulatory Visit: Payer: Self-pay | Admitting: Internal Medicine

## 2015-06-26 ENCOUNTER — Encounter: Payer: Self-pay | Admitting: Internal Medicine

## 2015-06-26 ENCOUNTER — Ambulatory Visit (INDEPENDENT_AMBULATORY_CARE_PROVIDER_SITE_OTHER): Payer: Commercial Managed Care - HMO | Admitting: Internal Medicine

## 2015-06-26 VITALS — BP 125/68 | HR 85 | Temp 97.6°F | Ht 60.5 in | Wt 154.5 lb

## 2015-06-26 DIAGNOSIS — E119 Type 2 diabetes mellitus without complications: Secondary | ICD-10-CM | POA: Diagnosis not present

## 2015-06-26 DIAGNOSIS — M79646 Pain in unspecified finger(s): Secondary | ICD-10-CM | POA: Insufficient documentation

## 2015-06-26 DIAGNOSIS — Z1211 Encounter for screening for malignant neoplasm of colon: Secondary | ICD-10-CM

## 2015-06-26 DIAGNOSIS — I10 Essential (primary) hypertension: Secondary | ICD-10-CM | POA: Diagnosis not present

## 2015-06-26 DIAGNOSIS — M79645 Pain in left finger(s): Secondary | ICD-10-CM

## 2015-06-26 NOTE — Patient Instructions (Addendum)
Please schedule fasting labs.  We will set up evaluation with Hand Surgery.  Follow up in 3 months.

## 2015-06-26 NOTE — Assessment & Plan Note (Signed)
BP Readings from Last 3 Encounters:  06/26/15 125/68  03/14/15 135/70  09/02/14 110/64   BP well controlled. Renal function with labs.

## 2015-06-26 NOTE — Assessment & Plan Note (Signed)
Left proximal thumb pain. She appears to have a small neuroma or ganglion cyst of the proximal thumb. She also has symptoms which are consistent with carpal tunnel. Will set up evaluation with hand surgery.

## 2015-06-26 NOTE — Progress Notes (Signed)
Pre visit review using our clinic review tool, if applicable. No additional management support is needed unless otherwise documented below in the visit note. 

## 2015-06-26 NOTE — Progress Notes (Signed)
Subjective:    Patient ID: Amber Glenn, female    DOB: [birth year], 65 y.o.   MRN: [id number]  HPI  65YO female presents for follow up.  DM - BG in 120-130s. Compliant with medication.  Left thumb pain - Proximal left thumb pain. Minimal improvement with Meloxicam. Notes some decrease in grip strength. Wearing a wrist brace at night with some improvement. Occasional numbness in "entire hand." Works in a factory pulling drapery, which makes symptoms worse. Had hand surgery in the past with Dr. Derrel Nip on right wrist.    Wt Readings from Last 3 Encounters:  06/26/15 154 lb 8 oz (70.081 kg)  03/14/15 149 lb 4 oz (67.699 kg)  09/02/14 150 lb 4 oz (68.153 kg)   BP Readings from Last 3 Encounters:  06/26/15 125/68  03/14/15 135/70  09/02/14 110/64    Past Medical History  Diagnosis Date  . Hyperlipidemia   . Diabetes mellitus   . HTN (hypertension)   . Cataract     right  . Post corneal transplant     right   Family History  Problem Relation Age of Onset  . Heart disease Mother   . Heart disease Father   . Heart disease Maternal Grandmother   . Heart disease Maternal Grandfather   . Breast cancer Sister 75   Past Surgical History  Procedure Laterality Date  . Carpal tunnel release      right  . Skin cancer excision    . Basal cell carcinoma excision      Dr. Manley Mason   Social History   Social History  . Marital Status: Widowed    Spouse Name: N/A  . Number of Children: N/A  . Years of Education: N/A   Occupational History  .  [employer]    Currently not employed   Social History Main Topics  . Smoking status: Former Smoker    Quit date: 01/13/2009  . Smokeless tobacco: Never Used  . Alcohol Use: No  . Drug Use: No  . Sexual Activity: Yes    Birth Control/ Protection: Post-menopausal   Other Topics Concern  . None   Social History Narrative    Review of Systems  Constitutional: Negative for fever, chills, appetite change, fatigue and unexpected  weight change.  Eyes: Negative for visual disturbance.  Respiratory: Negative for cough and shortness of breath.   Cardiovascular: Negative for chest pain, palpitations and leg swelling.  Gastrointestinal: Negative for abdominal pain.  Musculoskeletal: Positive for myalgias and arthralgias.  Skin: Negative for color change and rash.  Neurological: Positive for weakness and numbness.  Hematological: Negative for adenopathy. Does not bruise/bleed easily.  Psychiatric/Behavioral: Negative for dysphoric mood. The patient is not nervous/anxious.        Objective:    BP 125/68 mmHg  Pulse 85  Temp(Src) 97.6 F (36.4 C) (Oral)  Ht 5' 0.5" (1.537 m)  Wt 154 lb 8 oz (70.081 kg)  BMI 29.67 kg/m2  SpO2 96% Physical Exam  Constitutional: She is oriented to person, place, and time. She appears well-developed and well-nourished. No distress.  HENT:  Head: Normocephalic and atraumatic.  Right Ear: External ear normal.  Left Ear: External ear normal.  Nose: Nose normal.  Mouth/Throat: Oropharynx is clear and moist. No oropharyngeal exudate.  Eyes: Conjunctivae are normal. Pupils are equal, round, and reactive to light. Right eye exhibits no discharge. Left eye exhibits no discharge. No scleral icterus.  Neck: Normal range of motion. Neck supple. No tracheal  deviation present. No thyromegaly present.  Cardiovascular: Normal rate, regular rhythm, normal heart sounds and intact distal pulses.  Exam reveals no gallop and no friction rub.   No murmur heard. Pulmonary/Chest: Effort normal and breath sounds normal. No respiratory distress. She has no wheezes. She has no rales. She exhibits no tenderness.  Musculoskeletal: Normal range of motion. She exhibits no edema or tenderness.       Left wrist: She exhibits normal range of motion and no tenderness.       Arms: Lymphadenopathy:    She has no cervical adenopathy.  Neurological: She is alert and oriented to person, place, and time. No cranial  nerve deficit. She exhibits normal muscle tone. Coordination normal.  Skin: Skin is warm and dry. No rash noted. She is not diaphoretic. No erythema. No pallor.  Psychiatric: She has a normal mood and affect. Her behavior is normal. Judgment and thought content normal.          Assessment & Plan:   Problem List Items Addressed This Visit      Unprioritized   Diabetes mellitus type 2, controlled ([place]) - Primary    BG well controlled by report. Will continue Metformin. Check A1c with labs.      Relevant Orders   Comprehensive metabolic panel   Hemoglobin A1c   Lipid panel   Hypertension    BP Readings from Last 3 Encounters:  06/26/15 125/68  03/14/15 135/70  09/02/14 110/64   BP well controlled. Renal function with labs.      Screening for colon cancer    She is due for colonoscopy in 08/2015. She would prefer to wait to schedule this.      Thumb pain    Left proximal thumb pain. She appears to have a small neuroma or ganglion cyst of the proximal thumb. She also has symptoms which are consistent with carpal tunnel. Will set up evaluation with hand surgery.      Relevant Orders   Ambulatory referral to Hand Surgery       Return in about 3 months (around 09/26/2015) for Recheck of Diabetes.  Ronette Deter, MD Internal Medicine [facility]

## 2015-06-26 NOTE — Assessment & Plan Note (Signed)
BG well controlled by report. Will continue Metformin. Check A1c with labs.

## 2015-06-26 NOTE — Assessment & Plan Note (Signed)
She is due for colonoscopy in 08/2015. She would prefer to wait to schedule this.

## 2015-07-03 ENCOUNTER — Other Ambulatory Visit (INDEPENDENT_AMBULATORY_CARE_PROVIDER_SITE_OTHER): Payer: Commercial Managed Care - HMO

## 2015-07-03 DIAGNOSIS — E119 Type 2 diabetes mellitus without complications: Secondary | ICD-10-CM

## 2015-07-03 LAB — COMPREHENSIVE METABOLIC PANEL
ALT: 23 U/L (ref 0–35)
AST: 22 U/L (ref 0–37)
Albumin: 4.5 g/dL (ref 3.5–5.2)
Alkaline Phosphatase: 70 U/L (ref 39–117)
BUN: 24 mg/dL — ABNORMAL HIGH (ref 6–23)
CO2: 29 mEq/L (ref 19–32)
Calcium: 9.6 mg/dL (ref 8.4–10.5)
Chloride: 102 mEq/L (ref 96–112)
Creatinine, Ser: 0.98 mg/dL (ref 0.40–1.20)
GFR: 60.28 mL/min (ref >60.00)
Glucose, Bld: 90 mg/dL (ref 70–99)
Potassium: 4.1 mEq/L (ref 3.5–5.1)
Sodium: 141 mEq/L (ref 135–145)
Total Bilirubin: 0.4 mg/dL (ref 0.2–1.2)
Total Protein: 7.1 g/dL (ref 6.0–8.3)

## 2015-07-03 LAB — LIPID PANEL
Cholesterol: 176 mg/dL (ref 0–200)
HDL: 49.9 mg/dL (ref >39.00)
LDL Cholesterol: 97 mg/dL (ref 0–99)
NonHDL: 125.78
Total CHOL/HDL Ratio: 4
Triglycerides: 142 mg/dL (ref 0.0–149.0)
VLDL: 28.4 mg/dL (ref 0.0–40.0)

## 2015-07-03 LAB — HEMOGLOBIN A1C: Hgb A1c MFr Bld: 6.2 % (ref 4.6–6.5)

## 2015-08-26 ENCOUNTER — Other Ambulatory Visit: Payer: Self-pay | Admitting: Orthopedic Surgery

## 2015-10-06 ENCOUNTER — Other Ambulatory Visit: Payer: Self-pay | Admitting: Internal Medicine

## 2015-10-07 NOTE — Telephone Encounter (Signed)
Pt is requesting a refill on the meloxicam last filled on 12/20/14 #90 + 3, last OV 06/26/15. Ok to refill?

## 2015-12-03 ENCOUNTER — Encounter: Payer: Self-pay | Admitting: *Deleted

## 2015-12-03 ENCOUNTER — Ambulatory Visit: Payer: Medicare HMO | Admitting: Anesthesiology

## 2015-12-03 ENCOUNTER — Ambulatory Visit
Admission: RE | Admit: 2015-12-03 | Discharge: 2015-12-03 | Disposition: A | Discharge status: Home / Self Care | Payer: Medicare HMO | Source: Ambulatory Visit | Attending: Unknown Physician Specialty | Admitting: Unknown Physician Specialty

## 2015-12-03 ENCOUNTER — Encounter
Admission: RE | Disposition: A | Discharge status: Home / Self Care | Payer: Self-pay | Source: Ambulatory Visit | Attending: Unknown Physician Specialty

## 2015-12-03 DIAGNOSIS — E119 Type 2 diabetes mellitus without complications: Secondary | ICD-10-CM | POA: Insufficient documentation

## 2015-12-03 DIAGNOSIS — D123 Benign neoplasm of transverse colon: Secondary | ICD-10-CM | POA: Insufficient documentation

## 2015-12-03 DIAGNOSIS — Z947 Corneal transplant status: Secondary | ICD-10-CM | POA: Insufficient documentation

## 2015-12-03 DIAGNOSIS — K5730 Diverticulosis of large intestine without perforation or abscess without bleeding: Secondary | ICD-10-CM | POA: Insufficient documentation

## 2015-12-03 DIAGNOSIS — Z1211 Encounter for screening for malignant neoplasm of colon: Secondary | ICD-10-CM | POA: Diagnosis not present

## 2015-12-03 DIAGNOSIS — Z7984 Long term (current) use of oral hypoglycemic drugs: Secondary | ICD-10-CM | POA: Diagnosis not present

## 2015-12-03 DIAGNOSIS — K219 Gastro-esophageal reflux disease without esophagitis: Secondary | ICD-10-CM | POA: Insufficient documentation

## 2015-12-03 DIAGNOSIS — Z803 Family history of malignant neoplasm of breast: Secondary | ICD-10-CM | POA: Diagnosis not present

## 2015-12-03 DIAGNOSIS — Z86010 Personal history of colonic polyps: Secondary | ICD-10-CM | POA: Diagnosis not present

## 2015-12-03 DIAGNOSIS — K640 First degree hemorrhoids: Secondary | ICD-10-CM | POA: Diagnosis not present

## 2015-12-03 DIAGNOSIS — Z87891 Personal history of nicotine dependence: Secondary | ICD-10-CM | POA: Insufficient documentation

## 2015-12-03 DIAGNOSIS — D125 Benign neoplasm of sigmoid colon: Secondary | ICD-10-CM | POA: Diagnosis not present

## 2015-12-03 DIAGNOSIS — Z7982 Long term (current) use of aspirin: Secondary | ICD-10-CM | POA: Insufficient documentation

## 2015-12-03 DIAGNOSIS — I10 Essential (primary) hypertension: Secondary | ICD-10-CM | POA: Insufficient documentation

## 2015-12-03 DIAGNOSIS — Z8249 Family history of ischemic heart disease and other diseases of the circulatory system: Secondary | ICD-10-CM | POA: Diagnosis not present

## 2015-12-03 DIAGNOSIS — E785 Hyperlipidemia, unspecified: Secondary | ICD-10-CM | POA: Insufficient documentation

## 2015-12-03 DIAGNOSIS — Z85828 Personal history of other malignant neoplasm of skin: Secondary | ICD-10-CM | POA: Diagnosis not present

## 2015-12-03 DIAGNOSIS — Z79899 Other long term (current) drug therapy: Secondary | ICD-10-CM | POA: Diagnosis not present

## 2015-12-03 HISTORY — DX: Gastro-esophageal reflux disease without esophagitis: K21.9

## 2015-12-03 HISTORY — PX: COLONOSCOPY WITH PROPOFOL: SHX5780

## 2015-12-03 LAB — GLUCOSE, CAPILLARY: Glucose-Capillary: 122 mg/dL — ABNORMAL HIGH (ref 65–99)

## 2015-12-03 SURGERY — COLONOSCOPY WITH PROPOFOL
Anesthesia: General

## 2015-12-03 MED ORDER — SODIUM CHLORIDE 0.9 % IV SOLN: INTRAVENOUS | Status: DC

## 2015-12-03 MED ORDER — MIDAZOLAM HCL 2 MG/2ML IJ SOLN
INTRAMUSCULAR | Status: DC | PRN
  Administered 2015-12-03: 2 mg via INTRAVENOUS

## 2015-12-03 MED ORDER — PROPOFOL 500 MG/50ML IV EMUL
INTRAVENOUS | Status: DC | PRN
  Administered 2015-12-03: 85 ug/kg/min via INTRAVENOUS

## 2015-12-03 MED ORDER — SODIUM CHLORIDE 0.9 % IV SOLN
INTRAVENOUS | Status: DC
  Administered 2015-12-03: 07:00:00 via INTRAVENOUS

## 2015-12-03 MED ORDER — PROPOFOL 10 MG/ML IV BOLUS
INTRAVENOUS | Status: DC | PRN
  Administered 2015-12-03: 80 mg via INTRAVENOUS

## 2015-12-03 NOTE — Anesthesia Preprocedure Evaluation (Signed)
Anesthesia Evaluation  Patient identified by MRN, date of birth, ID band Patient awake    Reviewed: Allergy & Precautions, NPO status , Patient's Chart, lab work & pertinent test results  History of Anesthesia Complications Negative for: history of anesthetic complications  Airway Mallampati: II  TM Distance: >3 FB Neck ROM: Full    Dental no notable dental hx.    Pulmonary neg sleep apnea, neg COPD, former smoker,    breath sounds clear to auscultation- rhonchi (-) wheezing      Cardiovascular Exercise Tolerance: Good hypertension, Pt. on medications and Pt. on home beta blockers (-) CAD and (-) Past MI  Rhythm:Regular Rate:Normal - Systolic murmurs and - Diastolic murmurs    Neuro/Psych negative neurological ROS  negative psych ROS   GI/Hepatic Neg liver ROS, GERD  ,  Endo/Other  diabetes, Type 2, Oral Hypoglycemic Agents  Renal/GU negative Renal ROS     Musculoskeletal  (+) Arthritis , Osteoarthritis,    Abdominal (+) - obese,   Peds  Hematology negative hematology ROS (+)   Anesthesia Other Findings Past Medical History: No date: Cataract     Comment: right No date: Diabetes mellitus No date: HTN (hypertension) No date: Hyperlipidemia No date: Post corneal transplant     Comment: right   Reproductive/Obstetrics                             Anesthesia Physical Anesthesia Plan  ASA: III  Anesthesia Plan: General   Post-op Pain Management:    Induction: Intravenous  Airway Management Planned: Natural Airway  Additional Equipment:   Intra-op Plan:   Post-operative Plan:   Informed Consent: I have reviewed the patients History and Physical, chart, labs and discussed the procedure including the risks, benefits and alternatives for the proposed anesthesia with the patient or authorized representative who has indicated his/her understanding and acceptance.   Dental  advisory given  Plan Discussed with: CRNA and Anesthesiologist  Anesthesia Plan Comments:         Anesthesia Quick Evaluation

## 2015-12-03 NOTE — Transfer of Care (Signed)
Immediate Anesthesia Transfer of Care Note  Patient: Amber Glenn  Procedure(s) Performed: Procedure(s): COLONOSCOPY WITH PROPOFOL (N/A)  Patient Location: PACU  Anesthesia Type:General  Level of Consciousness: awake, alert  and oriented  Airway & Oxygen Therapy: Patient Spontanous Breathing and Patient connected to nasal cannula oxygen  Post-op Assessment: Report given to RN  Post vital signs: Reviewed and stable  Last Vitals:  Vitals:   12/03/15 0655 12/03/15 0802  BP: (!) 137/58 106/60  Pulse: 91   Resp: 14   Temp: 36.4 C     Last Pain:  Vitals:   12/03/15 0655  TempSrc: Tympanic         Complications: No apparent anesthesia complications

## 2015-12-03 NOTE — H&P (Signed)
Primary Care Physician:  Rica Mast, MD Primary Gastroenterologist:  Dr. Vira Agar  Pre-Procedure History & Physical: HPI:  Amber Glenn is a 65 y.o. female is here for an colonoscopy.   Past Medical History:  Diagnosis Date  . Cataract    right  . Diabetes mellitus    Borderline Diabetes  . GERD (gastroesophageal reflux disease)   . HTN (hypertension)   . Hyperlipidemia   . Post corneal transplant    right    Past Surgical History:  Procedure Laterality Date  . BASAL CELL CARCINOMA EXCISION     Dr. Manley Mason  . CARPAL TUNNEL RELEASE     right  . SKIN CANCER EXCISION      Prior to Admission medications   Medication Sig Start Date End Date Taking? Authorizing Provider  Ascorbic Acid (VITAMIN C) 1000 MG tablet Take 1,000 mg by mouth daily.     Yes Historical Provider, MD  aspirin 81 MG tablet Take 81 mg by mouth daily.     Yes Historical Provider, MD  carvedilol (COREG) 6.25 MG tablet TAKE 1 TABLET TWICE DAILY WITH MEALS 10/07/15  Yes Jackolyn Confer, MD  furosemide (LASIX) 40 MG tablet Take 1 tablet (40 mg total) by mouth daily. 12/20/14  Yes Jackolyn Confer, MD  losartan (COZAAR) 25 MG tablet TAKE 1 TABLET EVERY DAY 03/12/15  Yes Jackolyn Confer, MD  meloxicam (MOBIC) 15 MG tablet TAKE 1 TABLET EVERY DAY 10/07/15  Yes Jackolyn Confer, MD  Multiple Vitamin (MULTIVITAMIN) capsule Take 1 capsule by mouth daily.     Yes Historical Provider, MD  omeprazole (PRILOSEC) 20 MG capsule Take 1 capsule (20 mg total) by mouth daily. 12/20/14  Yes Jackolyn Confer, MD  prednisoLONE acetate (PRED MILD) 0.12 % ophthalmic suspension Place 1 drop into the right eye daily.   Yes Historical Provider, MD  simvastatin (ZOCOR) 40 MG tablet TAKE 1 TABLET EVERY DAY  AT  6:00 PM 12/20/14  Yes Jackolyn Confer, MD  Blood Glucose Monitoring Suppl (TRUE METRIX AIR GLUCOSE METER) DEVI 1 each by Does not apply route daily. Check sugar once daily Dx E11.9 06/17/14   Jackolyn Confer, MD   glucose blood (TRUE METRIX BLOOD GLUCOSE TEST) test strip Check sugar once daily Dx E11.9 06/17/14   Jackolyn Confer, MD  metFORMIN (GLUCOPHAGE) 500 MG tablet TAKE 1 TABLET TWICE DAILY WITH A MEAL 05/27/15   Jackolyn Confer, MD  TRUEPLUS LANCETS 28G MISC Check sugar once daily Dx E11.9 06/17/14   Jackolyn Confer, MD    Allergies as of 12/02/2015  . (No Known Allergies)    Family History  Problem Relation Age of Onset  . Heart disease Mother   . Heart disease Father   . Heart disease Maternal Grandmother   . Heart disease Maternal Grandfather   . Breast cancer Sister 70    Social History   Social History  . Marital status: Widowed    Spouse name: N/A  . Number of children: N/A  . Years of education: N/A   Occupational History  .  [employer]    Currently not employed   Social History Main Topics  . Smoking status: Former Smoker    Quit date: 01/13/2009  . Smokeless tobacco: Never Used  . Alcohol use No  . Drug use: No  . Sexual activity: Yes    Birth control/ protection: Post-menopausal   Other Topics Concern  . Not on file  Social History Narrative  . No narrative on file    Review of Systems: See HPI, otherwise negative ROS  Physical Exam: BP (!) 137/58   Pulse 91   Temp 97.5 F (36.4 C) (Tympanic)   Resp 14   Ht '5\' 1"'$  (1.549 m)   Wt 69.9 kg (154 lb)   SpO2 98%   BMI 29.10 kg/m  General:   Alert,  pleasant and cooperative in NAD Head:  Normocephalic and atraumatic. Neck:  Supple; no masses or thyromegaly. Lungs:  Clear throughout to auscultation.    Heart:  Regular rate and rhythm. Abdomen:  Soft, nontender and nondistended. Normal bowel sounds, without guarding, and without rebound.   Neurologic:  Alert and  oriented x4;  grossly normal neurologically.  Impression/Plan: Amber Glenn is here for an colonoscopy to be performed for [facility] colon polyps  Risks, benefits, limitations, and alternatives regarding  colonoscopy have been reviewed with the  patient.  Questions have been answered.  All parties agreeable.   Gaylyn Cheers, MD  12/03/2015, 7:20 AM

## 2015-12-03 NOTE — Anesthesia Postprocedure Evaluation (Signed)
Anesthesia Post Note  Patient: INESS PANGILINAN  Procedure(s) Performed: Procedure(s) (LRB): COLONOSCOPY WITH PROPOFOL (N/A)  Patient location during evaluation: Endoscopy Anesthesia Type: General Level of consciousness: awake and alert and oriented Pain management: pain level controlled Vital Signs Assessment: post-procedure vital signs reviewed and stable Respiratory status: spontaneous breathing, nonlabored ventilation and respiratory function stable Cardiovascular status: blood pressure returned to baseline and stable Postop Assessment: no signs of nausea or vomiting Anesthetic complications: no    Last Vitals:  Vitals:   12/03/15 0655 12/03/15 0802  BP: (!) 137/58 106/60  Pulse: 91 71  Resp: 14 20  Temp: 36.4 C 36.2 C    Last Pain:  Vitals:   12/03/15 0802  TempSrc: Tympanic                 Sincere Berlanga

## 2015-12-03 NOTE — Op Note (Signed)
[facility] Gastroenterology Patient Name: Amber Glenn Procedure Date: 12/03/2015 7:16 AM MRN: [id number] Account #: [id number] Date of Birth: July 03, 1950 Admit Type: Outpatient Age: 65 Room: [facility] ENDO ROOM 4 Gender: Female Note Status: Finalized Procedure:            Colonoscopy Indications:          High risk colon cancer surveillance: Personal history                        of colonic polyps Providers:            Manya Silvas, MD Referring MD:         Eduard Clos. Gilford Rile, MD (Referring MD) Medicines:            Propofol per Anesthesia Complications:        No immediate complications. Procedure:            Pre-Anesthesia Assessment:                       - After reviewing the risks and benefits, the patient                        was deemed in satisfactory condition to undergo the                        procedure.                       After obtaining informed consent, the colonoscope was                        passed under direct vision. Throughout the procedure,                        the patient's blood pressure, pulse, and oxygen                        saturations were monitored continuously. The                        Colonoscope was introduced through the anus and                        advanced to the the cecum, identified by appendiceal                        orifice and ileocecal valve. The colonoscopy was                        performed without difficulty. The patient tolerated the                        procedure well. The quality of the bowel preparation                        was good. Findings:      A diminutive polyp was found in the transverse colon. The polyp was       sessile. The polyp was removed with a jumbo cold forceps. Resection and       retrieval were complete.      A diminutive polyp was  found in the sigmoid colon. The polyp was       sessile. The polyp was removed with a jumbo cold forceps. Resection and       retrieval were  complete.      Multiple medium-mouthed diverticula were found in the sigmoid colon,       descending colon and transverse colon.      Internal hemorrhoids were found during endoscopy. The hemorrhoids were       small and Grade I (internal hemorrhoids that do not prolapse).      The exam was otherwise without abnormality. Impression:           - One diminutive polyp in the transverse colon, removed                        with a jumbo cold forceps. Resected and retrieved.                       - One diminutive polyp in the sigmoid colon, removed                        with a jumbo cold forceps. Resected and retrieved.                       - Diverticulosis in the sigmoid colon, in the                        descending colon and in the transverse colon.                       - Internal hemorrhoids.                       - The examination was otherwise normal. Recommendation:       - Await pathology results. Manya Silvas, MD 12/03/2015 7:57:59 AM This report has been signed electronically. Number of Addenda: 0 Note Initiated On: 12/03/2015 7:16 AM Scope Withdrawal Time: 0 hours 9 minutes 1 second  Total Procedure Duration: 0 hours 17 minutes 21 seconds       [facility]

## 2015-12-04 ENCOUNTER — Encounter: Payer: Self-pay | Admitting: Unknown Physician Specialty

## 2015-12-04 LAB — SURGICAL PATHOLOGY

## 2016-01-27 IMAGING — MG MM DIGITAL SCREENING BILAT W/ CAD
4 series · 4 of 4 positions shown · non-contrast
Comparison: Previous exam(s).

CLINICAL DATA: Screening.

EXAM:
DIGITAL SCREENING BILATERAL MAMMOGRAM WITH CAD

[L CC]
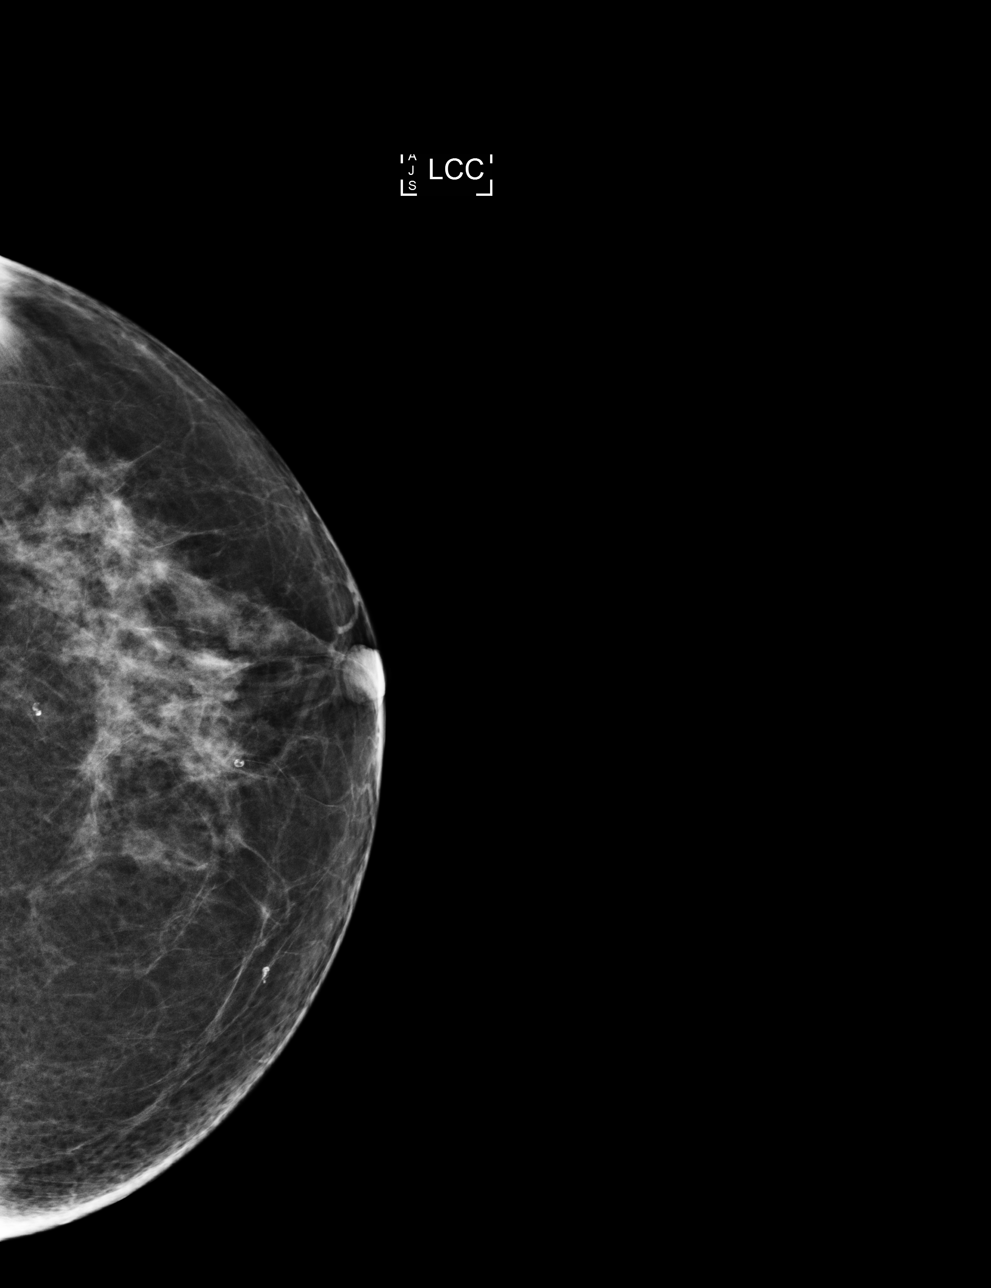

[R CC]
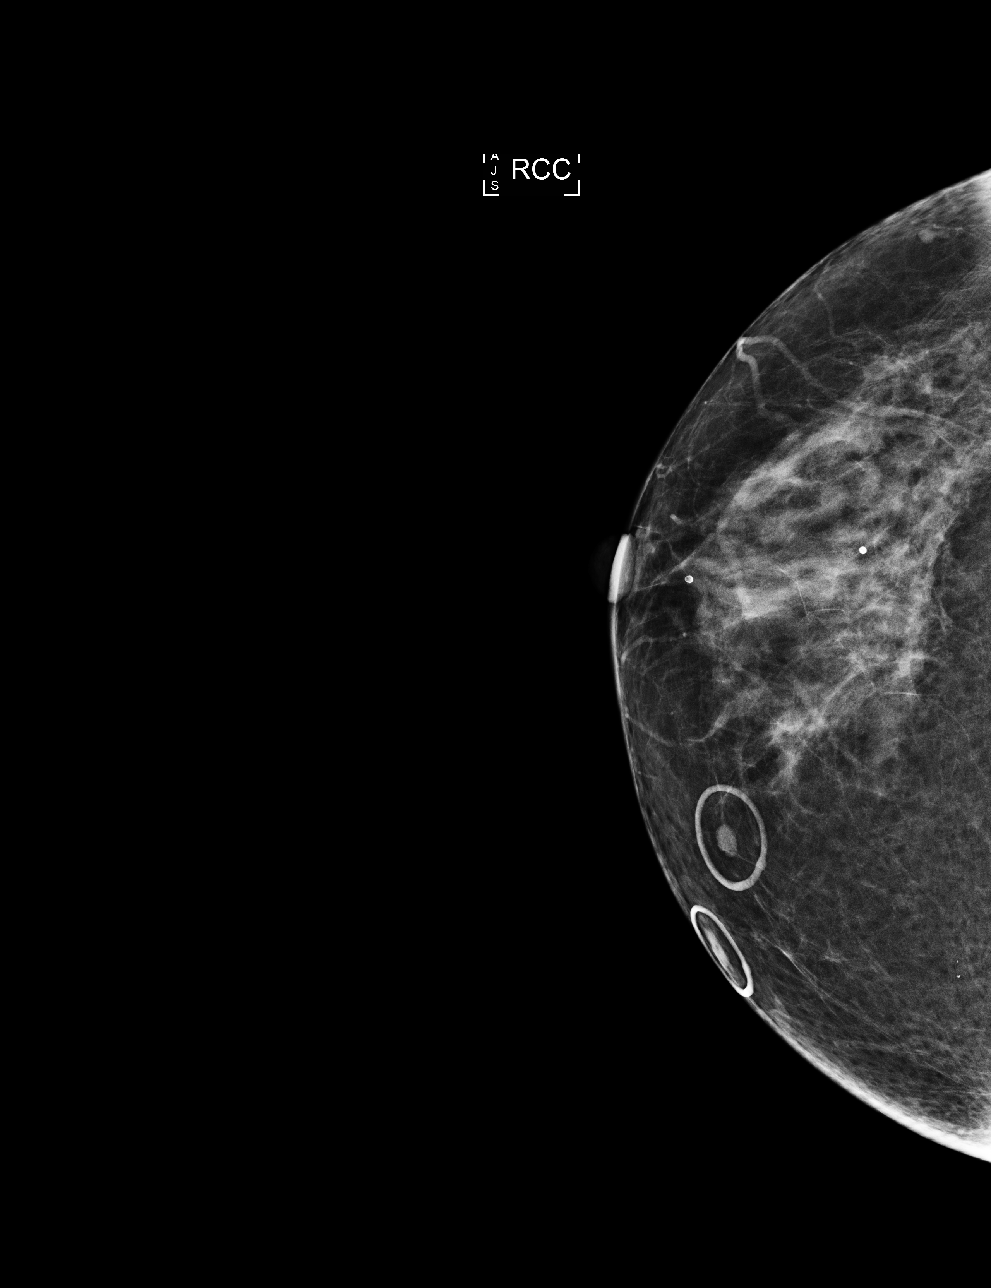

[L MLO]
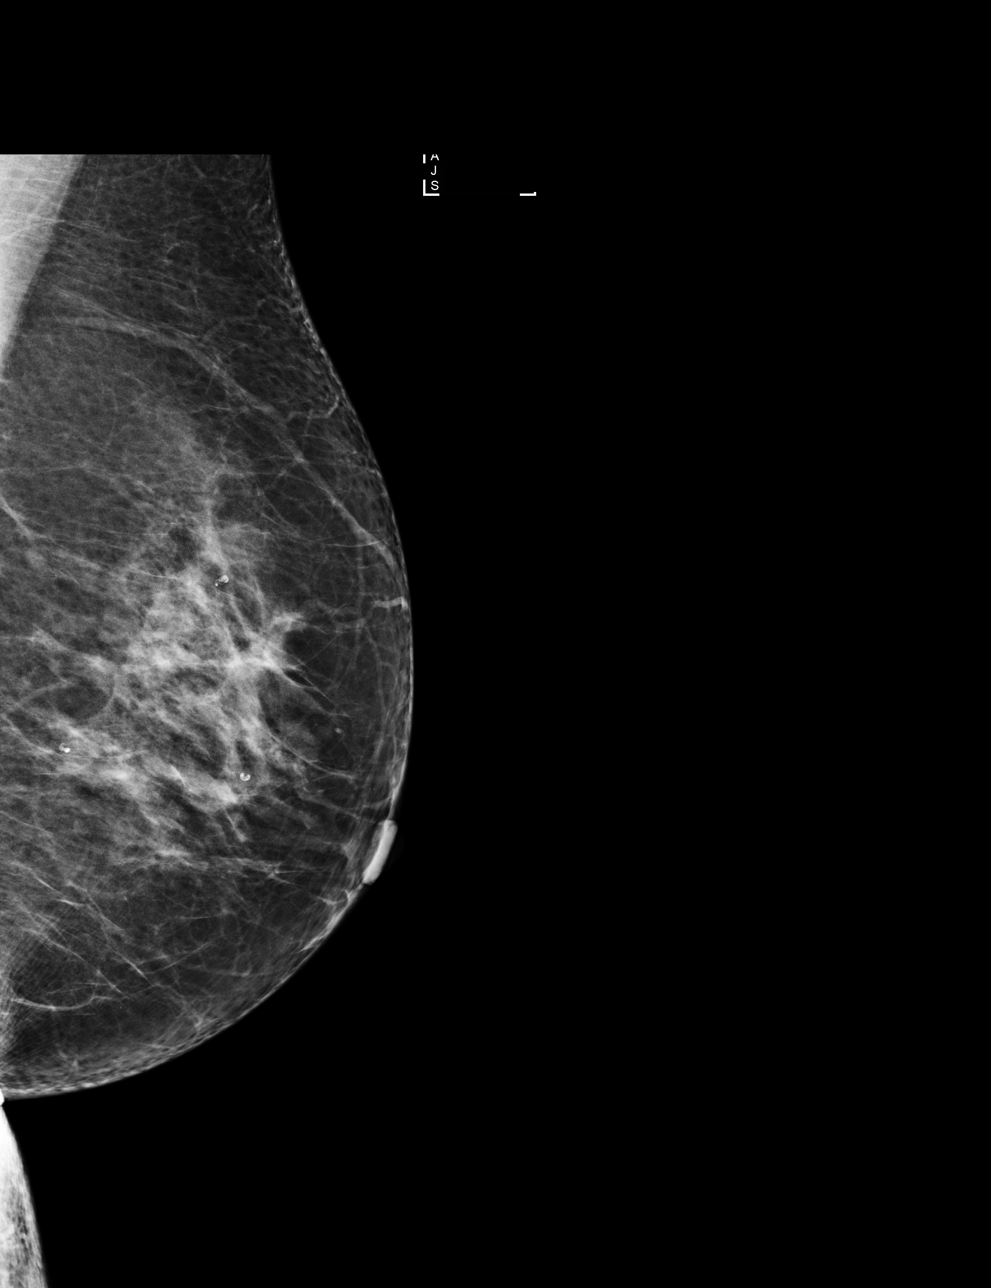

[R MLO]
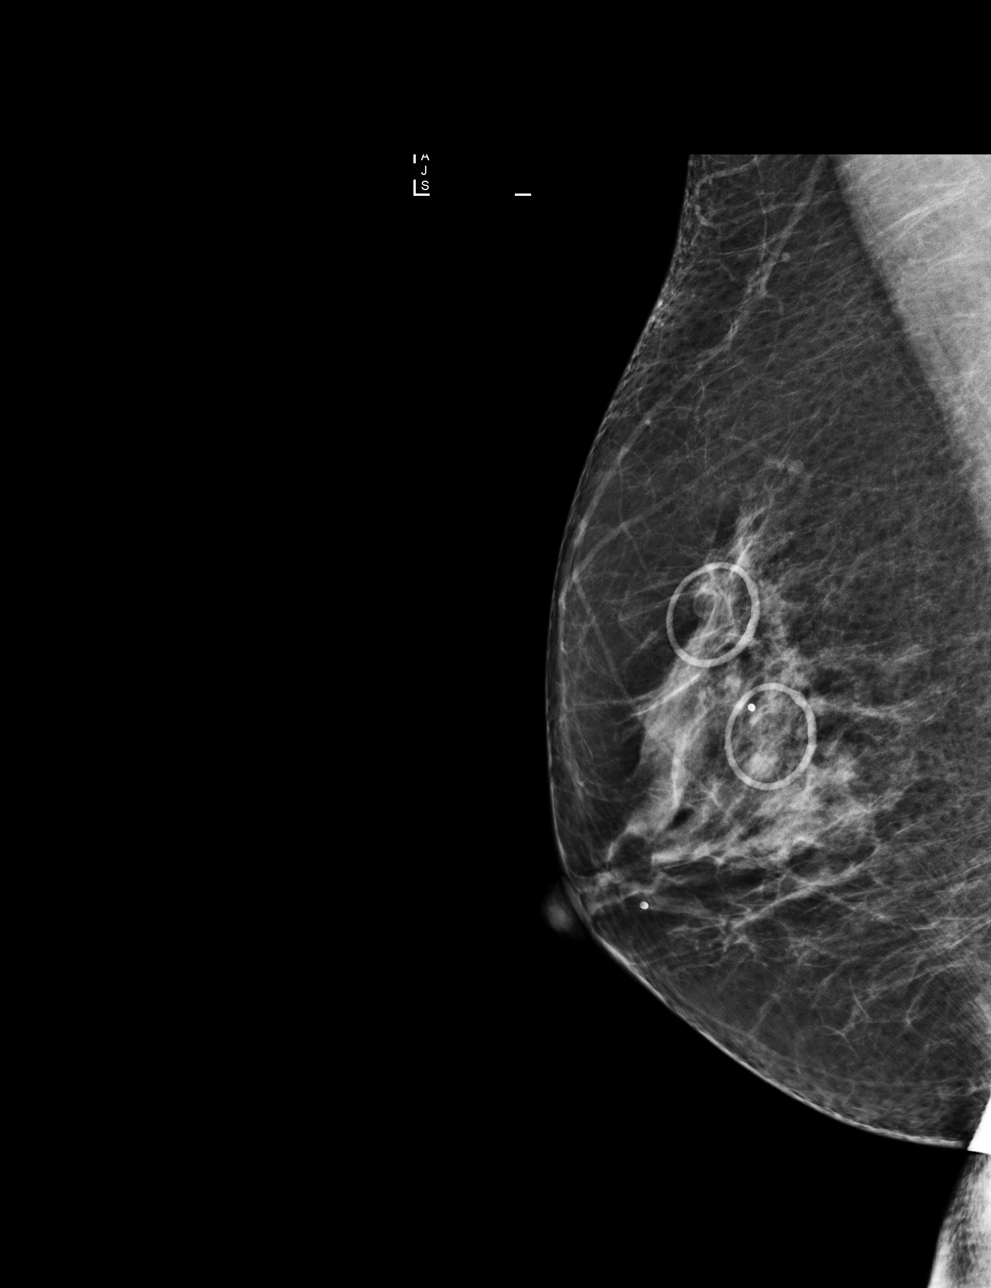

[4 of 4 positions shown; findings below may reference images not displayed]

ACR Breast Density Category c: The breast tissue is heterogeneously
dense, which may obscure small masses.
FINDINGS: There are no findings suspicious for malignancy. Images were
processed with CAD.
IMPRESSION: No mammographic evidence of malignancy. A result letter of this
screening mammogram will be mailed directly to the patient.

RECOMMENDATION:
Screening mammogram in one year. (Code:[id number])

BI-RADS CATEGORY  1: Negative.

## 2017-09-08 ENCOUNTER — Other Ambulatory Visit: Payer: Self-pay | Admitting: Family Medicine

## 2017-09-08 DIAGNOSIS — Z1231 Encounter for screening mammogram for malignant neoplasm of breast: Secondary | ICD-10-CM

## 2017-09-20 ENCOUNTER — Ambulatory Visit: Payer: Self-pay

## 2017-09-28 ENCOUNTER — Ambulatory Visit
Admission: RE | Admit: 2017-09-28 | Discharge: 2017-09-28 | Disposition: A | Discharge status: Home / Self Care | Payer: Medicare HMO | Source: Ambulatory Visit | Attending: Family Medicine | Admitting: Family Medicine

## 2017-09-28 DIAGNOSIS — Z1231 Encounter for screening mammogram for malignant neoplasm of breast: Secondary | ICD-10-CM | POA: Diagnosis present

## 2019-05-16 ENCOUNTER — Encounter: Payer: Self-pay | Admitting: Podiatry

## 2019-05-17 ENCOUNTER — Other Ambulatory Visit: Payer: Self-pay | Admitting: Podiatry

## 2019-05-21 ENCOUNTER — Other Ambulatory Visit
Admission: RE | Admit: 2019-05-21 | Discharge: 2019-05-21 | Disposition: A | Discharge status: Home / Self Care | Payer: Medicare HMO | Source: Ambulatory Visit | Attending: Podiatry | Admitting: Podiatry

## 2019-05-21 ENCOUNTER — Other Ambulatory Visit: Payer: Self-pay

## 2019-05-21 DIAGNOSIS — Z01812 Encounter for preprocedural laboratory examination: Secondary | ICD-10-CM | POA: Insufficient documentation

## 2019-05-21 DIAGNOSIS — Z20822 Contact with and (suspected) exposure to covid-19: Secondary | ICD-10-CM | POA: Diagnosis not present

## 2019-05-22 LAB — SARS CORONAVIRUS 2 (TAT 6-24 HRS): SARS Coronavirus 2: NEGATIVE

## 2019-05-23 ENCOUNTER — Ambulatory Visit
Admission: RE | Admit: 2019-05-23 | Discharge: 2019-05-23 | Disposition: A | Discharge status: Home / Self Care | Payer: Medicare HMO | Attending: Podiatry | Admitting: Podiatry

## 2019-05-23 ENCOUNTER — Ambulatory Visit: Payer: Medicare HMO | Admitting: Anesthesiology

## 2019-05-23 ENCOUNTER — Encounter
Admission: RE | Disposition: A | Discharge status: Home / Self Care | Payer: Self-pay | Source: Home / Self Care | Attending: Podiatry

## 2019-05-23 ENCOUNTER — Other Ambulatory Visit: Payer: Self-pay

## 2019-05-23 DIAGNOSIS — Z7984 Long term (current) use of oral hypoglycemic drugs: Secondary | ICD-10-CM | POA: Diagnosis not present

## 2019-05-23 DIAGNOSIS — M2011 Hallux valgus (acquired), right foot: Secondary | ICD-10-CM | POA: Diagnosis present

## 2019-05-23 DIAGNOSIS — M2041 Other hammer toe(s) (acquired), right foot: Secondary | ICD-10-CM | POA: Diagnosis not present

## 2019-05-23 DIAGNOSIS — Z87891 Personal history of nicotine dependence: Secondary | ICD-10-CM | POA: Diagnosis not present

## 2019-05-23 DIAGNOSIS — Z791 Long term (current) use of non-steroidal anti-inflammatories (NSAID): Secondary | ICD-10-CM | POA: Diagnosis not present

## 2019-05-23 DIAGNOSIS — Z7982 Long term (current) use of aspirin: Secondary | ICD-10-CM | POA: Diagnosis not present

## 2019-05-23 DIAGNOSIS — M205X1 Other deformities of toe(s) (acquired), right foot: Secondary | ICD-10-CM | POA: Insufficient documentation

## 2019-05-23 DIAGNOSIS — I10 Essential (primary) hypertension: Secondary | ICD-10-CM | POA: Diagnosis not present

## 2019-05-23 DIAGNOSIS — E119 Type 2 diabetes mellitus without complications: Secondary | ICD-10-CM | POA: Diagnosis not present

## 2019-05-23 DIAGNOSIS — E7849 Other hyperlipidemia: Secondary | ICD-10-CM | POA: Diagnosis not present

## 2019-05-23 DIAGNOSIS — Z79899 Other long term (current) drug therapy: Secondary | ICD-10-CM | POA: Insufficient documentation

## 2019-05-23 HISTORY — DX: Motion sickness, initial encounter: T75.3XXA

## 2019-05-23 HISTORY — DX: Unspecified osteoarthritis, unspecified site: M19.90

## 2019-05-23 HISTORY — PX: OSTECTOMY: SHX6439

## 2019-05-23 HISTORY — PX: HAMMER TOE SURGERY: SHX385

## 2019-05-23 LAB — GLUCOSE, CAPILLARY
Glucose-Capillary: 114 mg/dL — ABNORMAL HIGH (ref 70–99)
Glucose-Capillary: 124 mg/dL — ABNORMAL HIGH (ref 70–99)

## 2019-05-23 SURGERY — OSTECTOMY
Anesthesia: General | Site: Toe | Laterality: Right

## 2019-05-23 MED ORDER — ONDANSETRON HCL 4 MG/2ML IJ SOLN: 4.0000 mg | Freq: Four times a day (QID) | INTRAMUSCULAR | Status: DC | PRN

## 2019-05-23 MED ORDER — POVIDONE-IODINE 7.5 % EX SOLN: Freq: Once | CUTANEOUS | Status: DC

## 2019-05-23 MED ORDER — CEFAZOLIN SODIUM-DEXTROSE 2-4 GM/100ML-% IV SOLN
2.0000 g | INTRAVENOUS | Status: AC
  Administered 2019-05-23: 12:00:00 2 g via INTRAVENOUS

## 2019-05-23 MED ORDER — ACETAMINOPHEN 160 MG/5ML PO SOLN: 325.0000 mg | ORAL | Status: DC | PRN

## 2019-05-23 MED ORDER — LIDOCAINE HCL (CARDIAC) PF 100 MG/5ML IV SOSY
PREFILLED_SYRINGE | INTRAVENOUS | Status: DC | PRN
  Administered 2019-05-23: 30 mg via INTRATRACHEAL

## 2019-05-23 MED ORDER — LACTATED RINGERS IV SOLN
10.0000 mL/h | INTRAVENOUS | Status: DC
  Administered 2019-05-23: 10 mL/h via INTRAVENOUS

## 2019-05-23 MED ORDER — OXYCODONE HCL 5 MG/5ML PO SOLN: 5.0000 mg | Freq: Once | ORAL | Status: DC | PRN

## 2019-05-23 MED ORDER — EPHEDRINE SULFATE 50 MG/ML IJ SOLN
INTRAMUSCULAR | Status: DC | PRN
  Administered 2019-05-23: 10 mg via INTRAVENOUS

## 2019-05-23 MED ORDER — DEXAMETHASONE SODIUM PHOSPHATE 4 MG/ML IJ SOLN
INTRAMUSCULAR | Status: DC | PRN
  Administered 2019-05-23: 4 mg via INTRAVENOUS

## 2019-05-23 MED ORDER — ONDANSETRON HCL 4 MG/2ML IJ SOLN
INTRAMUSCULAR | Status: DC | PRN
  Administered 2019-05-23: 4 mg via INTRAVENOUS

## 2019-05-23 MED ORDER — FENTANYL CITRATE (PF) 100 MCG/2ML IJ SOLN
INTRAMUSCULAR | Status: DC | PRN
  Administered 2019-05-23 (×2): 25 ug via INTRAVENOUS

## 2019-05-23 MED ORDER — FENTANYL CITRATE (PF) 100 MCG/2ML IJ SOLN: 25.0000 ug | INTRAMUSCULAR | Status: DC | PRN

## 2019-05-23 MED ORDER — ACETAMINOPHEN 325 MG PO TABS: 325.0000 mg | ORAL_TABLET | ORAL | Status: DC | PRN

## 2019-05-23 MED ORDER — OXYCODONE HCL 5 MG PO TABS: 5.0000 mg | ORAL_TABLET | Freq: Once | ORAL | Status: DC | PRN

## 2019-05-23 MED ORDER — PROPOFOL 10 MG/ML IV BOLUS
INTRAVENOUS | Status: DC | PRN
  Administered 2019-05-23: 150 mg via INTRAVENOUS

## 2019-05-23 MED ORDER — GLYCOPYRROLATE 0.2 MG/ML IJ SOLN
INTRAMUSCULAR | Status: DC | PRN
  Administered 2019-05-23: .1 mg via INTRAVENOUS

## 2019-05-23 MED ORDER — ONDANSETRON HCL 4 MG/2ML IJ SOLN: 4.0000 mg | Freq: Once | INTRAMUSCULAR | Status: DC | PRN

## 2019-05-23 MED ORDER — BUPIVACAINE HCL (PF) 0.25 % IJ SOLN
INTRAMUSCULAR | Status: DC | PRN
  Administered 2019-05-23: 10 mL via INTRA_ARTICULAR

## 2019-05-23 MED ORDER — BUPIVACAINE LIPOSOME 1.3 % IJ SUSP
INTRAMUSCULAR | Status: DC | PRN
  Administered 2019-05-23 (×2): 10 mL

## 2019-05-23 MED ORDER — MIDAZOLAM HCL 5 MG/5ML IJ SOLN
INTRAMUSCULAR | Status: DC | PRN
  Administered 2019-05-23: 2 mg via INTRAVENOUS

## 2019-05-23 MED ORDER — PHENYLEPHRINE HCL (PRESSORS) 10 MG/ML IV SOLN
INTRAVENOUS | Status: DC | PRN
  Administered 2019-05-23: 50 ug via INTRAVENOUS

## 2019-05-23 MED ORDER — ONDANSETRON HCL 4 MG PO TABS: 4.0000 mg | ORAL_TABLET | Freq: Four times a day (QID) | ORAL | Status: DC | PRN

## 2019-05-23 MED ORDER — OXYCODONE-ACETAMINOPHEN 5-325 MG PO TABS: 1.0000 | ORAL_TABLET | ORAL | 0 refills | Status: DC | PRN

## 2019-05-23 SURGICAL SUPPLY — 49 items
2.0 CANNULATED DRILL ×4 IMPLANT
2.5 COUNTERSINK ×4 IMPLANT
BENZOIN TINCTURE PRP APPL 2/3 (GAUZE/BANDAGES/DRESSINGS) ×4 IMPLANT
BLADE MED AGGRESSIVE (BLADE) ×4 IMPLANT
BLADE MINI RND TIP GREEN BEAV (BLADE) ×4 IMPLANT
BLADE OSC/SAGITTAL MD 5.5X18 (BLADE) ×4 IMPLANT
BLADE SURG 15 STRL LF DISP TIS (BLADE) ×3 IMPLANT
BLADE SURG 15 STRL SS (BLADE) ×1
BNDG COHESIVE 4X5 TAN STRL (GAUZE/BANDAGES/DRESSINGS) ×4 IMPLANT
BNDG ESMARK 4X12 TAN STRL LF (GAUZE/BANDAGES/DRESSINGS) ×4 IMPLANT
BNDG GAUZE 4.5X4.1 6PLY STRL (MISCELLANEOUS) ×4 IMPLANT
BNDG STRETCH 4X75 STRL LF (GAUZE/BANDAGES/DRESSINGS) ×4 IMPLANT
CANISTER SUCT 1200ML W/VALVE (MISCELLANEOUS) ×4 IMPLANT
COVER LIGHT HANDLE UNIVERSAL (MISCELLANEOUS) ×8 IMPLANT
CUFF TOURN SGL QUICK 18X4 (TOURNIQUET CUFF) ×4 IMPLANT
DRAPE FLUOR MINI C-ARM 54X84 (DRAPES) ×4 IMPLANT
DURAPREP 26ML APPLICATOR (WOUND CARE) ×4 IMPLANT
ELECT REM PT RETURN 9FT ADLT (ELECTROSURGICAL) ×4
ELECTRODE REM PT RTRN 9FT ADLT (ELECTROSURGICAL) ×3 IMPLANT
GAUZE SPONGE 4X4 12PLY STRL (GAUZE/BANDAGES/DRESSINGS) ×4 IMPLANT
GAUZE XEROFORM 1X8 LF (GAUZE/BANDAGES/DRESSINGS) ×4 IMPLANT
GLOVE BIO SURGEON STRL SZ7.5 (GLOVE) ×4 IMPLANT
GLOVE INDICATOR 8.0 STRL GRN (GLOVE) ×4 IMPLANT
GOWN STRL REUS W/ TWL LRG LVL3 (GOWN DISPOSABLE) ×6 IMPLANT
GOWN STRL REUS W/TWL LRG LVL3 (GOWN DISPOSABLE) ×2
GUIDEWIRE .9 (WIRE) ×8 IMPLANT
K-WIRE 0.045 ×12 IMPLANT
K-WIRE 0.062 ×4 IMPLANT
K-WIRE DBL END TROCAR 6X.045 (WIRE) ×16
K-WIRE DBL END TROCAR 6X.062 (WIRE) ×4
KIT PROCEDURE DRILL (DRILL) ×4 IMPLANT
KIT TURNOVER KIT A (KITS) ×4 IMPLANT
KWIRE DBL END TROCAR 6X.045 (WIRE) ×12 IMPLANT
KWIRE DBL END TROCAR 6X.062 (WIRE) ×3 IMPLANT
NS IRRIG 500ML POUR BTL (IV SOLUTION) ×4 IMPLANT
PACK EXTREMITY ARMC (MISCELLANEOUS) ×4 IMPLANT
PENCIL SMOKE EVACUATOR (MISCELLANEOUS) ×4 IMPLANT
PIN BALLS 3/8 F/.045 WIRE (MISCELLANEOUS) ×16 IMPLANT
RASP SM TEAR CROSS CUT (RASP) ×4 IMPLANT
SCREW HEADLESS 2.5X18 (Screw) ×4 IMPLANT
STAPLE DYNACLIP 8X8 (Staple) ×4 IMPLANT
STOCKINETTE IMPERVIOUS LG (DRAPES) ×4 IMPLANT
STRIP CLOSURE SKIN 1/4X4 (GAUZE/BANDAGES/DRESSINGS) ×4 IMPLANT
SUT ETHILON 4 0 FS (SUTURE) ×4 IMPLANT
SUT MNCRL+ 5-0 UNDYED PC-3 (SUTURE) ×3 IMPLANT
SUT MONOCRYL 5-0 (SUTURE) ×1
SUT VIC AB 3-0 SH 27 (SUTURE) ×1
SUT VIC AB 3-0 SH 27X BRD (SUTURE) ×3 IMPLANT
SUT VIC AB 4-0 FS2 27 (SUTURE) ×4 IMPLANT

## 2019-05-23 NOTE — Transfer of Care (Signed)
Immediate Anesthesia Transfer of Care Note  Patient: Amber Glenn  Procedure(s) Performed: DOUBLE OSTEOTOMY RIGHT (Right Foot) HAMMER TOE CORRECTION (Right Toe)  Patient Location: PACU  Anesthesia Type: General LMA  Level of Consciousness: awake, alert  and patient cooperative  Airway and Oxygen Therapy: Patient Spontanous Breathing and Patient connected to supplemental oxygen  Post-op Assessment: Post-op Vital signs reviewed, Patient's Cardiovascular Status Stable, Respiratory Function Stable, Patent Airway and No signs of Nausea or vomiting  Post-op Vital Signs: Reviewed and stable  Complications: No apparent anesthesia complications

## 2019-05-23 NOTE — Anesthesia Procedure Notes (Signed)
Procedure Name: LMA Insertion Date/Time: 05/23/2019 12:15 PM Performed by: Cameron Ali, CRNA Pre-anesthesia Checklist: Patient identified, Emergency Drugs available, Suction available, Timeout performed and Patient being monitored Patient Re-evaluated:Patient Re-evaluated prior to induction Oxygen Delivery Method: Circle system utilized Preoxygenation: Pre-oxygenation with 100% oxygen Induction Type: IV induction LMA: LMA inserted LMA Size: 4.0 Number of attempts: 1 Placement Confirmation: positive ETCO2 and breath sounds checked- equal and bilateral Tube secured with: Tape Dental Injury: Teeth and Oropharynx as per pre-operative assessment

## 2019-05-23 NOTE — H&P (Signed)
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  The various methods of treatment have been discussed with the patient.  No guarantees were given.  After consideration of risks, benefits and other options for treatment, the patient has consented to surgery.  I have reviewed the patients' chart and labs.     A history and physical examination was performed in my office.  The patient was reexamined.  There have been no changes to this history and physical examination.  Amber Glenn A

## 2019-05-23 NOTE — Anesthesia Postprocedure Evaluation (Signed)
Anesthesia Post Note  Patient: Amber Glenn  Procedure(s) Performed: DOUBLE OSTEOTOMY RIGHT (Right Foot) HAMMER TOE CORRECTION (Right Toe)     Patient location during evaluation: PACU Anesthesia Type: General Level of consciousness: awake and alert and oriented Pain management: satisfactory to patient Vital Signs Assessment: post-procedure vital signs reviewed and stable Respiratory status: spontaneous breathing, nonlabored ventilation and respiratory function stable Cardiovascular status: blood pressure returned to baseline and stable Postop Assessment: Adequate PO intake and No signs of nausea or vomiting Anesthetic complications: no    Raliegh Ip

## 2019-05-23 NOTE — Discharge Instructions (Signed)
[place] DR. TROXLER, DR. Vickki Muff, AND DR. [place]   1. Take your medication as prescribed.  Pain medication should be taken only as needed.  2. Keep the dressing clean, dry and intact.  3. Keep your foot elevated above the heart level for the first 48 hours.  4. Walking to the bathroom and brief periods of walking are acceptable, unless we have instructed you to be non-weight bearing.  5. Always wear your post-op shoe when walking.  Always use your crutches if you are to be non-weight bearing.  6. Do not take a shower. Baths are permissible as long as the foot is kept out of the water.   7. Every hour you are awake:  - Bend your knee 15 times. - Flex foot 15 times - Massage calf 15 times  8. Call [facility] [phone]) if any of the following problems occur: - You develop a temperature or fever. - The bandage becomes saturated with blood. - Medication does not stop your pain. - Injury of the foot occurs. - Any symptoms of infection including redness, odor, or red streaks running from wound.  Information for Discharge Teaching: EXPAREL (bupivacaine liposome injectable suspension)   Your surgeon or anesthesiologist gave you EXPAREL(bupivacaine) to help control your pain after surgery.   EXPAREL is a local anesthetic that provides pain relief by numbing the tissue around the surgical site.  EXPAREL is designed to release pain medication over time and can control pain for up to 72 hours.  Depending on how you respond to EXPAREL, you may require less pain medication during your recovery.  Possible side effects:  Temporary loss of sensation or ability to move in the area where bupivacaine was injected.  Nausea, vomiting, constipation  Rarely, numbness and tingling in your mouth or lips, lightheadedness, or anxiety may occur.  Call your doctor right away  if you think you may be experiencing any of these sensations, or if you have other questions regarding possible side effects.  Follow all other discharge instructions given to you by your surgeon or nurse. Eat a healthy diet and drink plenty of water or other fluids.  If you return to the hospital for any reason within 96 hours following the administration of EXPAREL, it is important for health care providers to know that you have received this anesthetic. A teal colored band has been placed on your arm with the date, time and amount of EXPAREL you have received in order to alert and inform your health care providers. Please leave this armband in place for the full 96 hours following administration, and then you may remove the band.  General Anesthesia, Adult, Care After This sheet gives you information about how to care for yourself after your procedure. Your health care provider may also give you more specific instructions. If you have problems or questions, contact your health care provider. What can I expect after the procedure? After the procedure, the following side effects are common:  Pain or discomfort at the IV site.  Nausea.  Vomiting.  Sore throat.  Trouble concentrating.  Feeling cold or chills.  Weak or tired.  Sleepiness and fatigue.  Soreness and body aches. These side effects can affect parts of the body that were not involved in surgery. Follow these instructions at home:  For at least 24 hours after the procedure:  Have a responsible adult stay with you. It is important to  have someone help care for you until you are awake and alert.  Rest as needed.  Do not: ? Participate in activities in which you could fall or become injured. ? Drive. ? Use heavy machinery. ? Drink alcohol. ? Take sleeping pills or medicines that cause drowsiness. ? Make important decisions or sign legal documents. ? Take care of children on your own. Eating and drinking  Follow any  instructions from your health care provider about eating or drinking restrictions.  When you feel hungry, start by eating small amounts of foods that are soft and easy to digest (bland), such as toast. Gradually return to your regular diet.  Drink enough fluid to keep your urine pale yellow.  If you vomit, rehydrate by drinking water, juice, or clear broth. General instructions  If you have sleep apnea, surgery and certain medicines can increase your risk for breathing problems. Follow instructions from your health care provider about wearing your sleep device: ? Anytime you are sleeping, including during daytime naps. ? While taking prescription pain medicines, sleeping medicines, or medicines that make you drowsy.  Return to your normal activities as told by your health care provider. Ask your health care provider what activities are safe for you.  Take over-the-counter and prescription medicines only as told by your health care provider.  If you smoke, do not smoke without supervision.  Keep all follow-up visits as told by your health care provider. This is important. Contact a health care provider if:  You have nausea or vomiting that does not get better with medicine.  You cannot eat or drink without vomiting.  You have pain that does not get better with medicine.  You are unable to pass urine.  You develop a skin rash.  You have a fever.  You have redness around your IV site that gets worse. Get help right away if:  You have difficulty breathing.  You have chest pain.  You have blood in your urine or stool, or you vomit blood. Summary  After the procedure, it is common to have a sore throat or nausea. It is also common to feel tired.  Have a responsible adult stay with you for the first 24 hours after general anesthesia. It is important to have someone help care for you until you are awake and alert.  When you feel hungry, start by eating small amounts of foods  that are soft and easy to digest (bland), such as toast. Gradually return to your regular diet.  Drink enough fluid to keep your urine pale yellow.  Return to your normal activities as told by your health care provider. Ask your health care provider what activities are safe for you. This information is not intended to replace advice given to you by your health care provider. Make sure you discuss any questions you have with your health care provider. Document Revised: 04/01/2017 Document Reviewed: 11/12/2016 Elsevier Patient Education  [place].

## 2019-05-23 NOTE — Anesthesia Preprocedure Evaluation (Signed)
Anesthesia Evaluation  Patient identified by MRN, date of birth, ID band Patient awake    Reviewed: Allergy & Precautions, H&P , NPO status , Patient's Chart, lab work & pertinent test results  Airway Mallampati: III  TM Distance: >3 FB Neck ROM: full    Dental no notable dental hx.    Pulmonary former smoker,    Pulmonary exam normal breath sounds clear to auscultation       Cardiovascular hypertension, Normal cardiovascular exam Rhythm:regular Rate:Normal     Neuro/Psych    GI/Hepatic GERD  ,  Endo/Other  diabetes, Well Controlled, Type 2  Renal/GU      Musculoskeletal   Abdominal   Peds  Hematology   Anesthesia Other Findings   Reproductive/Obstetrics                             Anesthesia Physical Anesthesia Plan  ASA: II  Anesthesia Plan: General LMA   Post-op Pain Management:    Induction:   PONV Risk Score and Plan: 3 and Treatment may vary due to age or medical condition, Ondansetron, Dexamethasone and Midazolam  Airway Management Planned:   Additional Equipment:   Intra-op Plan:   Post-operative Plan:   Informed Consent: I have reviewed the patients History and Physical, chart, labs and discussed the procedure including the risks, benefits and alternatives for the proposed anesthesia with the patient or authorized representative who has indicated his/her understanding and acceptance.     Dental Advisory Given  Plan Discussed with: CRNA  Anesthesia Plan Comments:         Anesthesia Quick Evaluation

## 2019-05-23 NOTE — Op Note (Signed)
Operative note   Surgeon:Raynesha Tiedt Lawyer: None    Preop diagnosis: 1.  Hallux valgus deformity right foot. 2.  Hammertoe contracture right second toe 3.  Hammertoe contracture right third toe 4.  Hammertoe contracture right fourth toe 5.  Contracted metatarsophalangeal joint second toe 6.  Contracted metatarsophalangeal joint third toe    Postop diagnosis: Same    Procedure: 1.  [place] with Akin hallux valgus correction 2.  Hammertoe repair with PIPJ arthrodesis second toe 3.  Hammertoe repair with PIPJ arthrodesis third toe 4.  Open flexor tenotomy fourth toe 5.  Metatarsophalangeal joint release second metatarsophalangeal joint 6.  Metatarsophalangeal joint release third metatarsophalangeal joint    EBL: Minimal    Anesthesia:local and general    Hemostasis: Mid calf tourniquet inflated to 200 mmHg for approximately 90 minutes    Specimen: None    Complications: None    Operative indications:Amber Glenn is an 69 y.o. that presents today for surgical intervention.  The risks/benefits/alternatives/complications have been discussed and consent has been given.    Procedure:  Patient was brought into the OR and placed on the operating table in thesupine position. After anesthesia was obtained theright lower extremity was prepped and draped in usual sterile fashion.  Attention was directed to the dorsomedial first MTPJ where an incision was performed from proximal to the MTPJ to the level of the interphalangeal joint of the great toe.  Sharp and blunt dissection carried down to the capsule.  The intermetatarsal space was then entered.  The DTI L was transected.  The conjoined tendon of the abductor was noted and reflected.  Next a T capsulotomy was performed.  The prominent medial eminence was noted and transected with a power saw.  Next a V osteotomy was created distally and the capital fragment translocated laterally.  This was initially stabilized with a 0.062 K wire.  Next a  2.4 millimeter screw was driven from dorsal distal lateral to proximal medial plantar.  Good compression and stability was noted in all planes.  Mild residual valgus of the great toe was still noted.  At this time an Akin osteotomy was created to the proximal aspect of the proximal phalanx.  The apex was lateral.  The wound was closed and then stabilized with a compression bone staple.  A small capsulorrhaphy was performed.  Closure of the capsule was performed with a 3-0 Vicryl.  Subcutaneous tissue was closed with a 4-0 Vicryl and the skin closed with a 5-0 Monocryl.  Next a curvilinear incision was performed from the PIPJ of the second toe crossing the MTPJ.  Sharp and blunt dissection carried down to the PIPJ.  A tenotomy was performed and exposed the head of the proximal phalanx and base of the middle phalanx.  These were then excised with a power saw.  Good bleeding bone was noted at this time.  Residual dorsal contracture at the MTPJ was noted.  [place] plasty was performed to the long extensor tendon at the level of the MTPJ.  The dorsal medial and lateral capsule was then incised.  Next a McGlamry elevator was used to release the plantar capsule.  Good realignment of the second MTPJ was noted at this time.  A 0.045 K wire was driven from the base of the middle phalanx through the tip of the toe and retrograded back crossing the PIPJ to the base of the proximal phalanx.  Attention was then directed to the third toe where a longitudinal cyst  were performed from the PIPJ to the MTPJ.  Sharp and blunt dissection was carried down to the long extensor tendon.  Tenotomy was performed exposing the head of the proximal phalanx and base of the middle phalanx.  These were then excised with a power saw.  Residual dorsal location of the proximal phalanx was noted.  A Z-plasty at the level of the metatarsophalangeal joint was performed to the extensor tendon.  The dorsal medial and lateral capsule of the MTPJ was then  incised.  The second toe was in a much straighter position.  A 0.045 K wire was driven from the base of the middle phalanx to the tip of the toe and retrograded crossing the PIPJ into the base of the proximal phalanx.  Good stability and alignment was noted.  Attention was then directed to the fourth toe.  Contracture of the fourth toe was mostly distal to the PIPJ.  At this time a small incision was made to the plantar aspect of the fourth toe.  With a Beaver blade a small incision was performed in the long flexor tendon to the fourth toe was then incised.  The toe was in a much straighter position.  An incision was made dorsally at the level of the PIPJ.  The longus tensor was then incised exposing the MTPJ.  The 0.045 K wire was driven from the base of the middle phalanx through the tip of the toe and retrograded across in the PIPJ.  Good alignment was noted in all planes.  All wounds were then flushed with copious amounts of irrigation.  Closure of the extensor tendons was performed with 3-0 Vicryl.  The subcutaneous tissues were closed with a 3-0 Vicryl and the skin closed with a 4-0 nylon.  A bulky sterile dressing was applied to all areas.     Patient tolerated the procedure and anesthesia well.  Was transported from the OR to the PACU with all vital signs stable and vascular status intact. To be discharged per routine protocol.  Will follow up in approximately 1 week in the outpatient clinic.  Prescription for Percocet was sent to her pharmacy.

## 2019-05-25 ENCOUNTER — Encounter: Payer: Self-pay | Admitting: *Deleted

## 2019-08-17 ENCOUNTER — Other Ambulatory Visit: Payer: Self-pay

## 2019-08-17 DIAGNOSIS — K5900 Constipation, unspecified: Secondary | ICD-10-CM | POA: Diagnosis present

## 2019-08-17 DIAGNOSIS — Z8249 Family history of ischemic heart disease and other diseases of the circulatory system: Secondary | ICD-10-CM

## 2019-08-17 DIAGNOSIS — E1136 Type 2 diabetes mellitus with diabetic cataract: Secondary | ICD-10-CM | POA: Diagnosis present

## 2019-08-17 DIAGNOSIS — D649 Anemia, unspecified: Secondary | ICD-10-CM | POA: Diagnosis present

## 2019-08-17 DIAGNOSIS — U071 COVID-19: Principal | ICD-10-CM | POA: Diagnosis present

## 2019-08-17 DIAGNOSIS — Z6828 Body mass index (BMI) 28.0-28.9, adult: Secondary | ICD-10-CM

## 2019-08-17 DIAGNOSIS — Z7982 Long term (current) use of aspirin: Secondary | ICD-10-CM

## 2019-08-17 DIAGNOSIS — R630 Anorexia: Secondary | ICD-10-CM | POA: Diagnosis present

## 2019-08-17 DIAGNOSIS — Z791 Long term (current) use of non-steroidal anti-inflammatories (NSAID): Secondary | ICD-10-CM

## 2019-08-17 DIAGNOSIS — J1282 Pneumonia due to coronavirus disease 2019: Secondary | ICD-10-CM | POA: Diagnosis present

## 2019-08-17 DIAGNOSIS — E876 Hypokalemia: Secondary | ICD-10-CM | POA: Diagnosis present

## 2019-08-17 DIAGNOSIS — Z85828 Personal history of other malignant neoplasm of skin: Secondary | ICD-10-CM

## 2019-08-17 DIAGNOSIS — Z803 Family history of malignant neoplasm of breast: Secondary | ICD-10-CM

## 2019-08-17 DIAGNOSIS — E785 Hyperlipidemia, unspecified: Secondary | ICD-10-CM | POA: Diagnosis present

## 2019-08-17 DIAGNOSIS — I10 Essential (primary) hypertension: Secondary | ICD-10-CM | POA: Diagnosis present

## 2019-08-17 DIAGNOSIS — M19042 Primary osteoarthritis, left hand: Secondary | ICD-10-CM | POA: Diagnosis present

## 2019-08-17 DIAGNOSIS — Z947 Corneal transplant status: Secondary | ICD-10-CM

## 2019-08-17 DIAGNOSIS — Z79899 Other long term (current) drug therapy: Secondary | ICD-10-CM

## 2019-08-17 DIAGNOSIS — J9601 Acute respiratory failure with hypoxia: Secondary | ICD-10-CM | POA: Diagnosis present

## 2019-08-17 DIAGNOSIS — R197 Diarrhea, unspecified: Secondary | ICD-10-CM | POA: Diagnosis present

## 2019-08-17 DIAGNOSIS — M19041 Primary osteoarthritis, right hand: Secondary | ICD-10-CM | POA: Diagnosis present

## 2019-08-17 DIAGNOSIS — M19072 Primary osteoarthritis, left ankle and foot: Secondary | ICD-10-CM | POA: Diagnosis present

## 2019-08-17 DIAGNOSIS — M19071 Primary osteoarthritis, right ankle and foot: Secondary | ICD-10-CM | POA: Diagnosis present

## 2019-08-17 DIAGNOSIS — N179 Acute kidney failure, unspecified: Secondary | ICD-10-CM | POA: Diagnosis present

## 2019-08-17 DIAGNOSIS — R0600 Dyspnea, unspecified: Secondary | ICD-10-CM | POA: Diagnosis not present

## 2019-08-17 DIAGNOSIS — Z7984 Long term (current) use of oral hypoglycemic drugs: Secondary | ICD-10-CM

## 2019-08-17 DIAGNOSIS — K219 Gastro-esophageal reflux disease without esophagitis: Secondary | ICD-10-CM | POA: Diagnosis present

## 2019-08-17 DIAGNOSIS — E860 Dehydration: Secondary | ICD-10-CM | POA: Diagnosis present

## 2019-08-17 DIAGNOSIS — Z79891 Long term (current) use of opiate analgesic: Secondary | ICD-10-CM

## 2019-08-17 DIAGNOSIS — Z87891 Personal history of nicotine dependence: Secondary | ICD-10-CM

## 2019-08-17 NOTE — ED Triage Notes (Signed)
Patient reports being diagnosed with COVID on Thursday.  Reports here tonight due to diarrhea and fever.

## 2019-08-18 ENCOUNTER — Emergency Department: Payer: Medicare HMO

## 2019-08-18 ENCOUNTER — Inpatient Hospital Stay
Admission: EM | Admit: 2019-08-18 | Discharge: 2019-08-22 | DRG: 177 | Disposition: A | Discharge status: Home / Self Care | Payer: Medicare HMO | Attending: Internal Medicine | Admitting: Internal Medicine

## 2019-08-18 ENCOUNTER — Other Ambulatory Visit: Payer: Self-pay

## 2019-08-18 DIAGNOSIS — E119 Type 2 diabetes mellitus without complications: Secondary | ICD-10-CM

## 2019-08-18 DIAGNOSIS — E860 Dehydration: Secondary | ICD-10-CM | POA: Diagnosis present

## 2019-08-18 DIAGNOSIS — J9621 Acute and chronic respiratory failure with hypoxia: Secondary | ICD-10-CM | POA: Diagnosis not present

## 2019-08-18 DIAGNOSIS — E7849 Other hyperlipidemia: Secondary | ICD-10-CM | POA: Diagnosis not present

## 2019-08-18 DIAGNOSIS — Z87891 Personal history of nicotine dependence: Secondary | ICD-10-CM | POA: Diagnosis not present

## 2019-08-18 DIAGNOSIS — K219 Gastro-esophageal reflux disease without esophagitis: Secondary | ICD-10-CM | POA: Diagnosis present

## 2019-08-18 DIAGNOSIS — E869 Volume depletion, unspecified: Secondary | ICD-10-CM

## 2019-08-18 DIAGNOSIS — E785 Hyperlipidemia, unspecified: Secondary | ICD-10-CM | POA: Diagnosis present

## 2019-08-18 DIAGNOSIS — R630 Anorexia: Secondary | ICD-10-CM | POA: Diagnosis present

## 2019-08-18 DIAGNOSIS — R197 Diarrhea, unspecified: Secondary | ICD-10-CM

## 2019-08-18 DIAGNOSIS — Z947 Corneal transplant status: Secondary | ICD-10-CM | POA: Diagnosis not present

## 2019-08-18 DIAGNOSIS — U071 COVID-19: Principal | ICD-10-CM

## 2019-08-18 DIAGNOSIS — D649 Anemia, unspecified: Secondary | ICD-10-CM | POA: Diagnosis present

## 2019-08-18 DIAGNOSIS — N179 Acute kidney failure, unspecified: Secondary | ICD-10-CM | POA: Diagnosis present

## 2019-08-18 DIAGNOSIS — J22 Unspecified acute lower respiratory infection: Secondary | ICD-10-CM | POA: Diagnosis not present

## 2019-08-18 DIAGNOSIS — Z85828 Personal history of other malignant neoplasm of skin: Secondary | ICD-10-CM | POA: Diagnosis not present

## 2019-08-18 DIAGNOSIS — K5900 Constipation, unspecified: Secondary | ICD-10-CM | POA: Diagnosis present

## 2019-08-18 DIAGNOSIS — N189 Chronic kidney disease, unspecified: Secondary | ICD-10-CM | POA: Diagnosis not present

## 2019-08-18 DIAGNOSIS — Z803 Family history of malignant neoplasm of breast: Secondary | ICD-10-CM | POA: Diagnosis not present

## 2019-08-18 DIAGNOSIS — M19072 Primary osteoarthritis, left ankle and foot: Secondary | ICD-10-CM | POA: Diagnosis present

## 2019-08-18 DIAGNOSIS — M19042 Primary osteoarthritis, left hand: Secondary | ICD-10-CM | POA: Diagnosis present

## 2019-08-18 DIAGNOSIS — J189 Pneumonia, unspecified organism: Secondary | ICD-10-CM | POA: Diagnosis not present

## 2019-08-18 DIAGNOSIS — E876 Hypokalemia: Secondary | ICD-10-CM

## 2019-08-18 DIAGNOSIS — J1282 Pneumonia due to coronavirus disease 2019: Secondary | ICD-10-CM | POA: Diagnosis present

## 2019-08-18 DIAGNOSIS — J9601 Acute respiratory failure with hypoxia: Secondary | ICD-10-CM

## 2019-08-18 DIAGNOSIS — I10 Essential (primary) hypertension: Secondary | ICD-10-CM | POA: Diagnosis present

## 2019-08-18 DIAGNOSIS — Z6828 Body mass index (BMI) 28.0-28.9, adult: Secondary | ICD-10-CM | POA: Diagnosis not present

## 2019-08-18 DIAGNOSIS — M19041 Primary osteoarthritis, right hand: Secondary | ICD-10-CM | POA: Diagnosis present

## 2019-08-18 DIAGNOSIS — Z8249 Family history of ischemic heart disease and other diseases of the circulatory system: Secondary | ICD-10-CM | POA: Diagnosis not present

## 2019-08-18 DIAGNOSIS — M19071 Primary osteoarthritis, right ankle and foot: Secondary | ICD-10-CM | POA: Diagnosis present

## 2019-08-18 DIAGNOSIS — R0600 Dyspnea, unspecified: Secondary | ICD-10-CM | POA: Diagnosis present

## 2019-08-18 DIAGNOSIS — E1136 Type 2 diabetes mellitus with diabetic cataract: Secondary | ICD-10-CM | POA: Diagnosis present

## 2019-08-18 DIAGNOSIS — E118 Type 2 diabetes mellitus with unspecified complications: Secondary | ICD-10-CM | POA: Diagnosis not present

## 2019-08-18 LAB — CBC
HCT: 27.5 % — ABNORMAL LOW (ref 36.0–46.0)
Hemoglobin: 9.2 g/dL — ABNORMAL LOW (ref 12.0–15.0)
MCH: 28.5 pg (ref 26.0–34.0)
MCHC: 33.5 g/dL (ref 30.0–36.0)
MCV: 85.1 fL (ref 80.0–100.0)
Platelets: 166 10*3/uL (ref 150–400)
RBC: 3.23 MIL/uL — ABNORMAL LOW (ref 3.87–5.11)
RDW: 15.4 % (ref 11.5–15.5)
WBC: 4.6 10*3/uL (ref 4.0–10.5)
nRBC: 0 % (ref 0.0–0.2)

## 2019-08-18 LAB — URINALYSIS, COMPLETE (UACMP) WITH MICROSCOPIC
Bacteria, UA: NONE SEEN
Bilirubin Urine: NEGATIVE
Glucose, UA: NEGATIVE mg/dL
Hgb urine dipstick: NEGATIVE
Ketones, ur: 5 mg/dL — AB
Nitrite: NEGATIVE
Protein, ur: NEGATIVE mg/dL
Specific Gravity, Urine: 1.021 (ref 1.005–1.030)
pH: 5 (ref 5.0–8.0)

## 2019-08-18 LAB — COMPREHENSIVE METABOLIC PANEL
ALT: 42 U/L (ref 0–44)
AST: 79 U/L — ABNORMAL HIGH (ref 15–41)
Albumin: 3.3 g/dL — ABNORMAL LOW (ref 3.5–5.0)
Alkaline Phosphatase: 67 U/L (ref 38–126)
Anion gap: 11 (ref 5–15)
BUN: 23 mg/dL (ref 8–23)
CO2: 27 mmol/L (ref 22–32)
Calcium: 8 mg/dL — ABNORMAL LOW (ref 8.9–10.3)
Chloride: 100 mmol/L (ref 98–111)
Creatinine, Ser: 1.32 mg/dL — ABNORMAL HIGH (ref 0.44–1.00)
GFR calc Af Amer: 47 mL/min — ABNORMAL LOW (ref >60)
GFR calc non Af Amer: 41 mL/min — ABNORMAL LOW (ref >60)
Glucose, Bld: 119 mg/dL — ABNORMAL HIGH (ref 70–99)
Potassium: 3.2 mmol/L — ABNORMAL LOW (ref 3.5–5.1)
Sodium: 138 mmol/L (ref 135–145)
Total Bilirubin: 0.4 mg/dL (ref 0.3–1.2)
Total Protein: 6.7 g/dL (ref 6.5–8.1)

## 2019-08-18 LAB — TRIGLYCERIDES: Triglycerides: 179 mg/dL — ABNORMAL HIGH (ref <150)

## 2019-08-18 LAB — TROPONIN I (HIGH SENSITIVITY): Troponin I (High Sensitivity): 18 ng/L — ABNORMAL HIGH (ref <18)

## 2019-08-18 LAB — GLUCOSE, CAPILLARY
Glucose-Capillary: 165 mg/dL — ABNORMAL HIGH (ref 70–99)
Glucose-Capillary: 202 mg/dL — ABNORMAL HIGH (ref 70–99)
Glucose-Capillary: 133 mg/dL — ABNORMAL HIGH (ref 70–99)

## 2019-08-18 LAB — RESPIRATORY PANEL BY RT PCR (FLU A&B, COVID)
Influenza A by PCR: NEGATIVE
Influenza B by PCR: NEGATIVE
SARS Coronavirus 2 by RT PCR: POSITIVE — AB

## 2019-08-18 LAB — BRAIN NATRIURETIC PEPTIDE: B Natriuretic Peptide: 18 pg/mL (ref 0.0–100.0)

## 2019-08-18 LAB — LACTIC ACID, PLASMA
Lactic Acid, Venous: 2 mmol/L (ref 0.5–1.9)
Lactic Acid, Venous: 0.7 mmol/L (ref 0.5–1.9)

## 2019-08-18 LAB — FERRITIN: Ferritin: 147 ng/mL (ref 11–307)

## 2019-08-18 LAB — ABO/RH: ABO/RH(D): O NEG

## 2019-08-18 LAB — HIV ANTIBODY (ROUTINE TESTING W REFLEX): HIV Screen 4th Generation wRfx: NONREACTIVE

## 2019-08-18 LAB — FIBRINOGEN: Fibrinogen: 521 mg/dL — ABNORMAL HIGH (ref 210–475)

## 2019-08-18 LAB — FIBRIN DERIVATIVES D-DIMER (ARMC ONLY): Fibrin derivatives D-dimer (ARMC): 845.78 ng/mL (FEU) — ABNORMAL HIGH (ref 0.00–499.00)

## 2019-08-18 LAB — LIPASE, BLOOD: Lipase: 40 U/L (ref 11–51)

## 2019-08-18 LAB — PROCALCITONIN: Procalcitonin: 0.1 ng/mL

## 2019-08-18 LAB — LACTATE DEHYDROGENASE: LDH: 285 U/L — ABNORMAL HIGH (ref 98–192)

## 2019-08-18 LAB — C-REACTIVE PROTEIN: CRP: 6.4 mg/dL — ABNORMAL HIGH (ref <1.0)

## 2019-08-18 MED ORDER — SODIUM CHLORIDE 0.9 % IV SOLN
200.0000 mg | Freq: Once | INTRAVENOUS | Status: AC
  Administered 2019-08-18: 200 mg via INTRAVENOUS
  Filled 2019-08-18: qty 40

## 2019-08-18 MED ORDER — FUROSEMIDE 40 MG PO TABS
40.0000 mg | ORAL_TABLET | Freq: Every day | ORAL | Status: DC
  Administered 2019-08-18 – 2019-08-22 (×5): 40 mg via ORAL
  Filled 2019-08-18 (×5): qty 1

## 2019-08-18 MED ORDER — DEXAMETHASONE SODIUM PHOSPHATE 10 MG/ML IJ SOLN
6.0000 mg | INTRAMUSCULAR | Status: DC
  Administered 2019-08-19 – 2019-08-22 (×4): 6 mg via INTRAVENOUS
  Filled 2019-08-18 (×4): qty 1

## 2019-08-18 MED ORDER — SODIUM CHLORIDE 0.9 % IV SOLN: 200.0000 mg | Freq: Once | INTRAVENOUS | Status: DC

## 2019-08-18 MED ORDER — VITAMIN D 25 MCG (1000 UNIT) PO TABS
1000.0000 [IU] | ORAL_TABLET | Freq: Every day | ORAL | Status: DC
  Administered 2019-08-18 – 2019-08-22 (×5): 1000 [IU] via ORAL
  Filled 2019-08-18 (×5): qty 1

## 2019-08-18 MED ORDER — PREDNISOLONE ACETATE 0.12 % OP SUSP
1.0000 [drp] | Freq: Every day | OPHTHALMIC | Status: DC
  Administered 2019-08-18 – 2019-08-22 (×5): 1 [drp] via OPHTHALMIC
  Filled 2019-08-18: qty 5

## 2019-08-18 MED ORDER — HYDROCOD POLST-CPM POLST ER 10-8 MG/5ML PO SUER: 5.0000 mL | Freq: Two times a day (BID) | ORAL | Status: DC | PRN

## 2019-08-18 MED ORDER — ENOXAPARIN SODIUM 40 MG/0.4ML ~~LOC~~ SOLN
40.0000 mg | SUBCUTANEOUS | Status: DC
  Administered 2019-08-18 – 2019-08-22 (×5): 40 mg via SUBCUTANEOUS
  Filled 2019-08-18 (×4): qty 0.4

## 2019-08-18 MED ORDER — INSULIN ASPART 100 UNIT/ML ~~LOC~~ SOLN
0.0000 [IU] | Freq: Three times a day (TID) | SUBCUTANEOUS | Status: DC
  Administered 2019-08-18: 1 [IU] via SUBCUTANEOUS
  Administered 2019-08-18: 3 [IU] via SUBCUTANEOUS
  Administered 2019-08-18: 2 [IU] via SUBCUTANEOUS
  Administered 2019-08-19: 1 [IU] via SUBCUTANEOUS
  Administered 2019-08-19: 17:00:00 3 [IU] via SUBCUTANEOUS
  Administered 2019-08-19: 2 [IU] via SUBCUTANEOUS
  Administered 2019-08-20: 1 [IU] via SUBCUTANEOUS
  Administered 2019-08-20: 18:00:00 2 [IU] via SUBCUTANEOUS
  Administered 2019-08-20: 10:00:00 1 [IU] via SUBCUTANEOUS
  Administered 2019-08-20: 21:00:00 3 [IU] via SUBCUTANEOUS
  Administered 2019-08-21: 2 [IU] via SUBCUTANEOUS
  Administered 2019-08-21: 12:00:00 1 [IU] via SUBCUTANEOUS
  Administered 2019-08-21 – 2019-08-22 (×2): 2 [IU] via SUBCUTANEOUS
  Filled 2019-08-18 (×14): qty 1

## 2019-08-18 MED ORDER — ASCORBIC ACID 500 MG PO TABS
1000.0000 mg | ORAL_TABLET | Freq: Every day | ORAL | Status: DC
  Administered 2019-08-18 – 2019-08-22 (×5): 1000 mg via ORAL
  Filled 2019-08-18 (×5): qty 2

## 2019-08-18 MED ORDER — PANTOPRAZOLE SODIUM 40 MG PO TBEC
40.0000 mg | DELAYED_RELEASE_TABLET | Freq: Every day | ORAL | Status: DC
  Administered 2019-08-18 – 2019-08-22 (×5): 40 mg via ORAL
  Filled 2019-08-18 (×5): qty 1

## 2019-08-18 MED ORDER — IVERMECTIN 3 MG PO TABS: 150.0000 ug/kg | ORAL_TABLET | Freq: Once | ORAL | Status: DC

## 2019-08-18 MED ORDER — GUAIFENESIN ER 600 MG PO TB12
600.0000 mg | ORAL_TABLET | Freq: Two times a day (BID) | ORAL | Status: DC
  Administered 2019-08-18 – 2019-08-22 (×9): 600 mg via ORAL
  Filled 2019-08-18 (×10): qty 1

## 2019-08-18 MED ORDER — SODIUM CHLORIDE 0.9 % IV BOLUS: 1000.0000 mL | Freq: Once | INTRAVENOUS | Status: DC

## 2019-08-18 MED ORDER — SODIUM CHLORIDE 0.9 % IV BOLUS
1000.0000 mL | Freq: Once | INTRAVENOUS | Status: AC
  Administered 2019-08-18: 1000 mL via INTRAVENOUS

## 2019-08-18 MED ORDER — CARVEDILOL 3.125 MG PO TABS
6.2500 mg | ORAL_TABLET | Freq: Two times a day (BID) | ORAL | Status: DC
  Administered 2019-08-18 – 2019-08-22 (×8): 6.25 mg via ORAL
  Filled 2019-08-18 (×3): qty 2
  Filled 2019-08-18: qty 1
  Filled 2019-08-18 (×5): qty 2

## 2019-08-18 MED ORDER — ONDANSETRON HCL 4 MG/2ML IJ SOLN
4.0000 mg | INTRAMUSCULAR | Status: AC
  Administered 2019-08-18: 4 mg via INTRAVENOUS
  Filled 2019-08-18: qty 2

## 2019-08-18 MED ORDER — ONDANSETRON HCL 4 MG PO TABS: 4.0000 mg | ORAL_TABLET | Freq: Four times a day (QID) | ORAL | Status: DC | PRN

## 2019-08-18 MED ORDER — SIMVASTATIN 20 MG PO TABS
40.0000 mg | ORAL_TABLET | Freq: Every day | ORAL | Status: DC
  Administered 2019-08-18 – 2019-08-21 (×4): 40 mg via ORAL
  Filled 2019-08-18 (×4): qty 2

## 2019-08-18 MED ORDER — SODIUM CHLORIDE 0.9 % IV SOLN: INTRAVENOUS | Status: DC

## 2019-08-18 MED ORDER — LORATADINE 10 MG PO TABS
10.0000 mg | ORAL_TABLET | Freq: Every day | ORAL | Status: DC
  Administered 2019-08-18 – 2019-08-22 (×5): 10 mg via ORAL
  Filled 2019-08-18 (×5): qty 1

## 2019-08-18 MED ORDER — DEXAMETHASONE SODIUM PHOSPHATE 10 MG/ML IJ SOLN
10.0000 mg | Freq: Once | INTRAMUSCULAR | Status: AC
  Administered 2019-08-18: 10 mg via INTRAVENOUS
  Filled 2019-08-18: qty 1

## 2019-08-18 MED ORDER — PHENOL 1.4 % MT LIQD
1.0000 | OROMUCOSAL | Status: DC | PRN
  Filled 2019-08-18 (×2): qty 177

## 2019-08-18 MED ORDER — DEXAMETHASONE SODIUM PHOSPHATE 10 MG/ML IJ SOLN: 6.0000 mg | INTRAMUSCULAR | Status: DC

## 2019-08-18 MED ORDER — GUAIFENESIN-DM 100-10 MG/5ML PO SYRP
10.0000 mL | ORAL_SOLUTION | ORAL | Status: DC | PRN
  Administered 2019-08-19 (×2): 10 mL via ORAL
  Filled 2019-08-18 (×3): qty 10

## 2019-08-18 MED ORDER — SODIUM CHLORIDE 0.9 % IV SOLN
100.0000 mg | Freq: Every day | INTRAVENOUS | Status: AC
  Administered 2019-08-19 – 2019-08-22 (×4): 100 mg via INTRAVENOUS
  Filled 2019-08-18 (×4): qty 100

## 2019-08-18 MED ORDER — LOSARTAN POTASSIUM 50 MG PO TABS: 25.0000 mg | ORAL_TABLET | Freq: Every day | ORAL | Status: DC

## 2019-08-18 MED ORDER — FAMOTIDINE 20 MG PO TABS: 20.0000 mg | ORAL_TABLET | Freq: Two times a day (BID) | ORAL | Status: DC

## 2019-08-18 MED ORDER — SODIUM CHLORIDE 0.9 % IV SOLN: 100.0000 mg | Freq: Every day | INTRAVENOUS | Status: DC

## 2019-08-18 MED ORDER — POTASSIUM CHLORIDE CRYS ER 20 MEQ PO TBCR
40.0000 meq | EXTENDED_RELEASE_TABLET | Freq: Once | ORAL | Status: AC
  Administered 2019-08-18: 40 meq via ORAL
  Filled 2019-08-18: qty 2

## 2019-08-18 MED ORDER — ADULT MULTIVITAMIN W/MINERALS CH
1.0000 | ORAL_TABLET | Freq: Every day | ORAL | Status: DC
  Administered 2019-08-18 – 2019-08-22 (×5): 1 via ORAL
  Filled 2019-08-18 (×5): qty 1

## 2019-08-18 MED ORDER — ASPIRIN EC 81 MG PO TBEC
81.0000 mg | DELAYED_RELEASE_TABLET | Freq: Every day | ORAL | Status: DC
  Administered 2019-08-18 – 2019-08-22 (×5): 81 mg via ORAL
  Filled 2019-08-18 (×5): qty 1

## 2019-08-18 MED ORDER — TRAZODONE HCL 50 MG PO TABS: 25.0000 mg | ORAL_TABLET | Freq: Every evening | ORAL | Status: DC | PRN

## 2019-08-18 MED ORDER — ONDANSETRON HCL 4 MG/2ML IJ SOLN: 4.0000 mg | Freq: Four times a day (QID) | INTRAMUSCULAR | Status: DC | PRN

## 2019-08-18 NOTE — ED Notes (Signed)
Pt ambulated in hallway for approximately 59ft to assess pt's ability to maintain adequate O2 sats. Pt noted to start with an O2 sat of 94% on RA. During ambulation the pt's O2 sats never dropped below 92%, but pt's O2 sats did momentarily drop to 89% for a few seconds upon completion of ambulation trial (when pt was returning to the ED stretcher) before rebounding to 93-94%.

## 2019-08-18 NOTE — ED Provider Notes (Signed)
[facility] Emergency Department Provider Note  ____________________________________________   First MD Initiated Contact with Patient 08/18/19 0157     (approximate)  I have reviewed the triage vital signs and the nursing notes.   HISTORY  Chief Complaint Diarrhea and Fever    HPI Amber Glenn is a 69 y.o. female with medical history as listed below who says she was diagnosed with COVID-19 by her primary care physician 2 days ago with approximately 9 days of symptoms (approximately 1 week of symptoms when she was tested).  She presents tonight for diarrhea and generalized weakness.  She reports that she continues to have a sore throat, general malaise and weakness, some body aches, cough, nausea, decreased appetite, and multiple loose stools per day.  She has been drinking what she describes as plenty of fluids but has not been able to eat very much.  Her symptoms have been gradual in onset and severe over the last 9 days.  Nothing particular makes her feel better or worse.  She denies difficulty breathing, just reports having a cough from time to time.  No pain.  She has not been immunized against COVID-19.         Past Medical History:  Diagnosis Date  . Arthritis    hands and feet  . Cataract    right  . Diabetes mellitus    Borderline Diabetes  . GERD (gastroesophageal reflux disease)   . HTN (hypertension)   . Hyperlipidemia   . Motion sickness   . Post corneal transplant    right    Patient Active Problem List   Diagnosis Date Noted  . Thumb pain 06/26/2015  . Medicare annual wellness visit, subsequent 03/14/2015  . Arthralgia 09/02/2014  . Hot flashes 01/21/2014  . Left shoulder pain 09/20/2013  . Unspecified constipation 08/30/2012  . Routine general medical examination at a health care facility 05/29/2012  . Screening for colon cancer 05/29/2012  . Hypertension 08/18/2011  . Osteoarthritis 03/20/2009  . Diabetes mellitus type  2, controlled ([place]) 11/14/2008  . BASAL CELL CARCINOMA, FACE 12/06/2006  . Hyperlipidemia 12/06/2006  . 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 ALLERGIC RHINITIS 12/06/2006  . GERD 12/06/2006  . 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 BASAL CELL CARCINOMA EXCISION     Dr. Manley Mason  . CARPAL TUNNEL RELEASE     right  . COLONOSCOPY WITH PROPOFOL N/A 12/03/2015   Procedure: COLONOSCOPY WITH PROPOFOL;  Surgeon: Manya Silvas, MD;  Location[facility] ENDOSCOPY;  Service: Endoscopy;  Laterality: N/A;  . HAMMER TOE SURGERY Right 05/23/2019   Procedure: HAMMER TOE CORRECTION;  Surgeon: Samara Deist, DPM;  Location: [facility];  Service: Podiatry;  Laterality: Right;  Diabetic  . OSTECTOMY Right 05/23/2019   Procedure: DOUBLE OSTEOTOMY RIGHT;  Surgeon: Samara Deist, DPM;  Location: [facility];  Service: Podiatry;  Laterality: Right;  . SKIN CANCER EXCISION      Prior to Admission medications   Medication Sig Start Date End Date Taking? Authorizing Provider  Ascorbic Acid (VITAMIN C) 1000 MG tablet Take 1,000 mg by mouth daily.      [provider]  aspirin 81 MG tablet Take 81 mg by mouth daily.      [provider]  Blood Glucose Monitoring Suppl (TRUE METRIX AIR GLUCOSE METER) DEVI 1 each by Does not apply route daily. Check sugar once daily Dx E11.9 06/17/14  Jackolyn Confer, MD  carvedilol (COREG) 6.25 MG tablet TAKE 1 TABLET TWICE DAILY WITH MEALS 10/07/15   Jackolyn Confer, MD  docusate sodium (COLACE) 100 MG capsule Take 100 mg by mouth 2 (two) times daily.    [provider]  furosemide (LASIX) 40 MG tablet Take 1 tablet (40 mg total) by mouth daily. 12/20/14   Jackolyn Confer, MD  glucose blood (TRUE METRIX BLOOD GLUCOSE TEST) test strip Check sugar once daily Dx E11.9 06/17/14   Jackolyn Confer, MD  losartan (COZAAR) 25 MG tablet TAKE 1 TABLET EVERY DAY 03/12/15    Jackolyn Confer, MD  meloxicam ([place]) 15 MG tablet TAKE 1 TABLET EVERY DAY Patient not taking: Reported on 05/16/2019 10/07/15   Jackolyn Confer, MD  metFORMIN (GLUCOPHAGE) 500 MG tablet TAKE 1 TABLET TWICE DAILY WITH A MEAL 05/27/15   Jackolyn Confer, MD  Multiple Vitamin (MULTIVITAMIN) capsule Take 1 capsule by mouth daily.      [provider]  omeprazole (PRILOSEC) 20 MG capsule Take 1 capsule (20 mg total) by mouth daily. 12/20/14   Jackolyn Confer, MD  oxyCODONE-acetaminophen (PERCOCET) 5-325 MG tablet Take 1-2 tablets by mouth every 4 (four) hours as needed for severe pain. 05/23/19   Samara Deist, DPM  prednisoLONE acetate (PRED MILD) 0.12 % ophthalmic suspension Place 1 drop into the right eye daily.    [provider]  simvastatin (ZOCOR) 40 MG tablet TAKE 1 TABLET EVERY DAY  AT  6:00 PM 12/20/14   Jackolyn Confer, MD  TRUEPLUS LANCETS 28G MISC Check sugar once daily Dx E11.9 06/17/14   Jackolyn Confer, MD    Allergies Patient has no known allergies.  Family History  Problem Relation Age of Onset  . Heart disease Mother   . Heart disease Father   . Heart disease Maternal Grandmother   . Heart disease Maternal Grandfather   . Breast cancer Sister 60    Social History Social History   Tobacco Use  . Smoking status: Former Smoker    Quit date: 01/13/2009    Years since quitting: 10.6  . Smokeless tobacco: Never Used  Substance Use Topics  . Alcohol use: No  . Drug use: No    Review of Systems Constitutional: General malaise and weakness. Eyes: No visual changes. ENT: +sore throat. Cardiovascular: Denies chest pain. Respiratory: Cough.  Denies shortness of breath. Gastrointestinal: No abdominal pain.  Nausea, diarrhea, decreased appetite and oral intake. Genitourinary: Negative for dysuria. Musculoskeletal: Generalized body aches.  Negative for neck pain.  Negative for back pain. Integumentary: Negative for rash. Neurological: Negative  for headaches, focal weakness or numbness.   ____________________________________________   PHYSICAL EXAM:  VITAL SIGNS: ED Triage Vitals  Enc Vitals Group     BP 08/18/19 0000 (!) 85/36     Pulse Rate 08/18/19 0000 77     Resp 08/18/19 0000 20     Temp 08/18/19 0000 97.7 F (36.5 C)     Temp Source 08/18/19 0000 Oral     SpO2 08/18/19 0000 95 %     Weight 08/17/19 2358 63.5 kg (140 lb)     Height 08/17/19 2358 1.499 m (4\' 11" )     Head Circumference --      Peak Flow --      Pain Score 08/17/19 2357 0     Pain Loc --      Pain Edu? --      Excl. in [place]? --  Constitutional: Alert and oriented.  Eyes: Conjunctivae are normal.  Head: Atraumatic. Nose: No congestion/rhinnorhea. Mouth/Throat: Patient is wearing a mask.  Mucous membranes appear somewhat dry upon examination. Neck: No stridor.  No meningeal signs.   Cardiovascular: Normal rate, regular rhythm. Good peripheral circulation. Grossly normal heart sounds. Respiratory: Mild tachypnea but with normal respiratory effort.  No retractions. Gastrointestinal: Soft and nontender. No distention.  Musculoskeletal: No lower extremity tenderness nor edema. No gross deformities of extremities. Neurologic:  Normal speech and language. No gross focal neurologic deficits are appreciated.  Skin:  Skin is warm, dry and intact. Psychiatric: Mood and affect are normal. Speech and behavior are normal.  ____________________________________________   LABS (all labs ordered are listed, but only abnormal results are displayed)  Labs Reviewed  COMPREHENSIVE METABOLIC PANEL - Abnormal; Notable for the following components:      Result Value   Potassium 3.2 (*)    Glucose, Bld 119 (*)    Creatinine, Ser 1.32 (*)    Calcium 8.0 (*)    Albumin 3.3 (*)    AST 79 (*)    GFR calc non Af Amer 41 (*)    GFR calc Af Amer 47 (*)    All other components within normal limits  CBC - Abnormal; Notable for the following components:   RBC  3.23 (*)    Hemoglobin 9.2 (*)    HCT 27.5 (*)    All other components within normal limits  LIPASE, BLOOD  URINALYSIS, COMPLETE (UACMP) WITH MICROSCOPIC   ____________________________________________  EKG  ED ECG REPORT I, Hinda Kehr, the attending physician, personally viewed and interpreted this ECG.  Date: 08/18/2019 EKG Time: 4:14 AM Rate: 85 Rhythm: normal sinus rhythm QRS Axis: normal Intervals: normal ST/T Wave abnormalities: normal Narrative Interpretation: no evidence of acute ischemia  ____________________________________________  RADIOLOGY I, Hinda Kehr, personally viewed and evaluated these images (plain radiographs) as part of my medical decision making, as well as reviewing the written report by the radiologist.  ED MD interpretation:  Multifocal pneumonia.  Official radiology report(s): No results found.  ____________________________________________   PROCEDURES   Procedure(s) performed (including Critical Care):  .Critical Care Performed by: Hinda Kehr, MD Authorized by: Hinda Kehr, MD   Critical care provider statement:    Critical care time (minutes):  30   Critical care time was exclusive of:  Separately billable procedures and treating other patients   Critical care was necessary to treat or prevent imminent or life-threatening deterioration of the following conditions:  Respiratory failure (hypoxemia due to COVID-19)   Critical care was time spent personally by me on the following activities:  Development of treatment plan with patient or surrogate, discussions with consultants, evaluation of patient's response to treatment, examination of patient, obtaining history from patient or surrogate, ordering and performing treatments and interventions, ordering and review of laboratory studies, ordering and review of radiographic studies, pulse oximetry, re-evaluation of patient's condition and review of old  charts     ____________________________________________   INITIAL IMPRESSION / MDM / [place] / ED COURSE  As part of my medical decision making, I reviewed the following data within the [place] notes reviewed and incorporated, Labs reviewed , EKG interpreted , Old chart reviewed, Radiograph reviewed , Discussed with admitting physician (Dr. Sidney Ace) and Notes from prior ED visits   Differential diagnosis includes, but is not limited to, COVID-19 sequela, volume depletion/dehydration, acute kidney injury, metabolic or electrolyte abnormality, bacterial infection such as community-acquired  pneumonia superimposed with COVID-19.  Patient has no abdominal tenderness to palpation and no respiratory difficulty except for cough.  However I observed her on the pulse oximeter while I was in the room and she would dip down into the mid 80s (I saw as low as 86) with a good waveform but then she would come back up to 95%.  She is mildly tachypneic at rest.  She appears volume depleted and has a mild acute kidney injury of 1.3 compared to her usual baseline creatinine of less than 1.  Hemoglobin is slightly low at 9.2 but she has no bleeding source at this time.  Initially she was hypertensive in triage but her blood pressure without treatment has come up to 103/46 but it is still low.  I am giving her 1 L normal saline for presumed volume depletion and dehydration.  Given the questionable oxygen saturation, her nurse ambulated her with a pulse oximeter and it was variable and dropped down to 89% after ambulation but during ambulation she stayed in the low 90s.  I have ordered a chest x-ray to look for evidence of multifocal pneumonia.  I will reassess after the IV fluids that the patient may need admission for hypoxemia and Covid related sequela.  The patient is on the cardiac monitor to evaluate for evidence of arrhythmia and/or significant heart rate changes.      Clinical Course as of Aug 18 351  Sat Aug 18, 2019  7672 Blood Culture (routine x 2) [CF]  [phone] The patient is now satting 88% at rest with a good waveform.  X-ray is indicative of multifocal pneumonia.  At this point she has an oxygen requirement and lower respiratory infection with chest x-ray changes secondary to COVID-19.  Since we cannot see the recent results in the system, I have ordered another PCR test.  I have also added on inflammatory markers.  I ordered Decadron 10 mg IV and remdesivir per pharmacy consult.  I consulted the hospitalist for admission.  Patient has been updated.   [CF]  [phone] Discussed case with Dr. Sidney Ace by phone who will admit.   [CF]    Clinical Course User Index [CF] Hinda Kehr, MD     ____________________________________________  FINAL CLINICAL IMPRESSION(S) / ED DIAGNOSES  Final diagnoses:  None     MEDICATIONS GIVEN DURING THIS VISIT:  Medications  sodium chloride 0.9 % bolus 1,000 mL (has no administration in time range)     ED Discharge Orders    None      *Please note:  Amber Glenn was evaluated in Emergency Department on 08/18/2019 for the symptoms described in the history of present illness. She was evaluated in the context of the global COVID-19 pandemic, which necessitated consideration that the patient might be at risk for infection with the SARS-CoV-2 virus that causes COVID-19. Institutional protocols and algorithms that pertain to the evaluation of patients at risk for COVID-19 are in a state of rapid change based on information released by regulatory bodies including the CDC and federal and state organizations. These policies and algorithms were followed during the patient's care in the ED.  Some ED evaluations and interventions may be delayed as a result of limited staffing during the pandemic.*  Note:  This document was prepared using Dragon voice recognition software and may include unintentional dictation errors.    Hinda Kehr, MD 08/18/19 [phone]

## 2019-08-18 NOTE — ED Notes (Signed)
Pt given lunch meal tray. 

## 2019-08-18 NOTE — ED Notes (Signed)
Pt given breakfast meal tray. 

## 2019-08-18 NOTE — ED Notes (Signed)
Dr Karma Greaser made aware at this time of pt's elevated Lactic Acid level as reported by lab. Lactic Acid 2.0 mmol/L

## 2019-08-18 NOTE — ED Notes (Signed)
Report received from [place], [place]

## 2019-08-18 NOTE — Progress Notes (Signed)
PROGRESS NOTE    Amber Glenn  HDQ:[id number] DOB: 11/29/1950 DOA: 08/18/2019 PCP: Lynnell Jude, MD       Assessment & Plan:   Active Problems:   Pneumonia due to COVID-19 virus   Acute hypoxemic respiratory failure: secondary to COVID-19. Continue on supplemental oxygen and wean as tolerated   Multifocal pneumonia: secondary to COVID-19. Continue on IV remdesivir, decadron, vitamin c & zinc. Airborne & contact precautions. Continue on bronchodilators. Encourage incentive spirometry and flutter valve. Continue on supplemental oxygen and wean as tolerated  Hypokalemia: KCl repleted. Will continue to monitor   AKI: likely prerenal secondary to volume depletion and dehydration with associated anorexia and diarrhea. Continue on IVFs   DM2: continue on SSI w/ accuchecks. Hold home dose of metformin   Dyslipidemia: continue on statin   Normocytic anemia: no need for a transfusion currently. Will continue to monitor   DVT prophylaxis: lovenox Code Status: full  Family Communication:  Disposition Plan: likely d/c home vs home health   Consultants:      Procedures:    Antimicrobials:    Subjective: Pt c/o shortness of breath   Objective: Vitals:   08/18/19 0605 08/18/19 0700 08/18/19 0715 08/18/19 0730  BP: (!) 115/51 (!) 115/49 (!) 109/57 (!) 122/57  Pulse: 88 83 87 85  Resp: 20 (!) 25 (!) 23 (!) 24  Temp:      TempSrc:      SpO2: 96% 95% 91% 95%  Weight:      Height:        Intake/Output Summary (Last 24 hours) at 08/18/2019 0749 Last data filed at 08/18/2019 0442 Gross per 24 hour  Intake 1250 ml  Output --  Net 1250 ml   Filed Weights   08/17/19 2358  Weight: 63.5 kg    Examination:  General exam: Appears calm and comfortable. Right eye erythema  Respiratory system: diminished breath sounds b/l. No rales  Cardiovascular system: S1 & S2 +. No  rubs, gallops or clicks.  Gastrointestinal system: Abdomen is nondistended, soft and nontender.  Normal bowel sounds heard. Central nervous system: Alert and oriented. Moves all 4 extremities  Psychiatry: Judgement and insight appear normal. Mood & affect appropriate.     Data Reviewed: I have personally reviewed following labs and imaging studies  CBC: Recent Labs  Lab 08/18/19 0002  WBC 4.6  HGB 9.2*  HCT 27.5*  MCV 85.1  PLT 119   Basic Metabolic Panel: Recent Labs  Lab 08/18/19 0002  NA 138  K 3.2*  CL 100  CO2 27  GLUCOSE 119*  BUN 23  CREATININE 1.32*  CALCIUM 8.0*   GFR: Estimated Creatinine Clearance: 32.1 mL/min (A) (by C-G formula based on SCr of 1.32 mg/dL (H)). Liver Function Tests: Recent Labs  Lab 08/18/19 0002  AST 79*  ALT 42  ALKPHOS 67  BILITOT 0.4  PROT 6.7  ALBUMIN 3.3*   Recent Labs  Lab 08/18/19 0002  LIPASE 40   No results for input(s): AMMONIA in the last 168 hours. Coagulation Profile: No results for input(s): INR, PROTIME in the last 168 hours. Cardiac Enzymes: No results for input(s): CKTOTAL, CKMB, CKMBINDEX, TROPONINI in the last 168 hours. BNP (last 3 results) No results for input(s): PROBNP in the last 8760 hours. HbA1C: No results for input(s): HGBA1C in the last 72 hours. CBG: No results for input(s): GLUCAP in the last 168 hours. Lipid Profile: Recent Labs    08/18/19 0002  TRIG 179*   Thyroid  Function Tests: No results for input(s): TSH, T4TOTAL, FREET4, T3FREE, THYROIDAB in the last 72 hours. Anemia Panel: Recent Labs    08/18/19 0002  FERRITIN 147   Sepsis Labs: Recent Labs  Lab 08/18/19 0002 08/18/19 0342 08/18/19 0536  PROCALCITON 0.10  --   --   LATICACIDVEN  --  2.0* 0.7    Recent Results (from the past 240 hour(s))  Blood Culture (routine x 2)     Status: None (Preliminary result)   Collection Time: 08/18/19  3:42 AM   Specimen: BLOOD  Result Value Ref Range Status   Specimen Description BLOOD BLOOD RIGHT HAND  Final   Special Requests   Final    BOTTLES DRAWN AEROBIC AND  ANAEROBIC Blood Culture adequate volume   Culture   Final    NO GROWTH < 12 HOURS Performed at [facility], [address] [place], [place] [address]    Report Status PENDING  Incomplete  Blood Culture (routine x 2)     Status: None (Preliminary result)   Collection Time: 08/18/19  3:52 AM   Specimen: BLOOD  Result Value Ref Range Status   Specimen Description BLOOD BLOOD LEFT HAND  Final   Special Requests   Final    BOTTLES DRAWN AEROBIC AND ANAEROBIC Blood Culture adequate volume   Culture   Final    NO GROWTH < 12 HOURS Performed at [facility], [address], [place], [place] [address]    Report Status PENDING  Incomplete  Respiratory Panel by RT PCR (Flu A&B, Covid) - Nasopharyngeal Swab     Status: Abnormal   Collection Time: 08/18/19  4:01 AM   Specimen: Nasopharyngeal Swab  Result Value Ref Range Status   SARS Coronavirus 2 by RT PCR POSITIVE (A) NEGATIVE Final    Comment: RESULT CALLED TO, READ BACK BY AND VERIFIED WITH: BURCH WOODS 08/18/19 AT 0512 HS    Influenza A by PCR NEGATIVE NEGATIVE Final   Influenza B by PCR NEGATIVE NEGATIVE Final    Comment: (NOTE) The Xpert Xpress SARS-CoV-2/FLU/RSV assay is intended as an aid in  the diagnosis of influenza from Nasopharyngeal swab specimens and  should not be used as a sole basis for treatment. Nasal washings and  aspirates are unacceptable for Xpert Xpress SARS-CoV-2/FLU/RSV  testing. Fact Sheet for Patients: PinkCheek.be Fact Sheet for Healthcare Providers: GravelBags.it This test is not yet approved or cleared by the [place] FDA and  has been authorized for detection and/or diagnosis of SARS-CoV-2 by  FDA under an Emergency Use Authorization (EUA). This EUA will remain  in effect (meaning this test can be used) for the duration of the  Covid-19 declaration under Section 564(b)(1) of the Act, 21  U.S.C. section 360bbb-3(b)(1),  unless the authorization is  terminated or revoked. Performed at [facility], [address] [place], [place] [address]          Radiology Studies: DG Chest Portable 1 View  Result Date: 08/18/2019 CLINICAL DATA:  69 year old female with positive COVID-19. Cough. EXAM: PORTABLE CHEST 1 VIEW COMPARISON:  None. FINDINGS: Bilateral confluent hazy densities as well as interstitial densities most consistent with multifocal pneumonia, likely viral or atypical in etiology and in keeping with COVID-19. There is no pleural effusion or pneumothorax. The cardiac silhouette is within limits. No acute osseous pathology. IMPRESSION: Multifocal pneumonia, likely viral or atypical in etiology. Clinical correlation and follow-up recommended. Electronically Signed   By: Anner Crete M.D.   On: 08/18/2019  03:19        Scheduled Meds: . vitamin C  1,000 mg Oral Daily  . aspirin EC  81 mg Oral Daily  . carvedilol  6.25 mg Oral BID WC  . cholecalciferol  1,000 Units Oral Daily  . [START ON 08/19/2019] dexamethasone (DECADRON) injection  6 mg Intravenous Q24H  . enoxaparin (LOVENOX) injection  40 mg Subcutaneous Q24H  . furosemide  40 mg Oral Daily  . guaiFENesin  600 mg Oral BID  . insulin aspart  0-9 Units Subcutaneous TID PC & HS  . loratadine  10 mg Oral Daily  . losartan  25 mg Oral Daily  . multivitamin with minerals  1 tablet Oral Daily  . pantoprazole  40 mg Oral Daily  . prednisoLONE acetate  1 drop Right Eye Daily  . simvastatin  40 mg Oral q1800   Continuous Infusions: . sodium chloride 100 mL/hr at 08/18/19 0604  . [START ON 08/19/2019] remdesivir 100 mg in NS 100 mL       LOS: 0 days    Time spent: 34 mins    Wyvonnia Dusky, MD [facility] Pager [phone]  If 7PM-7AM, please contact night-coverage [email or web address] 08/18/2019, 7:49 AM

## 2019-08-18 NOTE — H&P (Addendum)
[place] [facility] [place] NAME: Amber Glenn    MR#:  [id number]  DATE OF BIRTH:  [birth year]  DATE OF ADMISSION:  08/18/2019  PRIMARY CARE PHYSICIAN: Lynnell Jude, MD   REQUESTING/REFERRING PHYSICIAN: Hinda Kehr, MD  CHIEF COMPLAINT:   Chief Complaint  Patient presents with  . Diarrhea  . Fever    HISTORY OF PRESENT ILLNESS:  Amber Glenn  is a 69 y.o. Caucasian female with a known history of type 2 diabetes mellitus, hypertension, dyslipidemia and GERD, who presented to the emergency room with acute onset of worsening dyspnea, dry cough with associated sore throat malaise with diminished appetite and significant diarrhea as well as fatigue and tiredness.  She denied any loss of taste or smell.  She admitted to intermittent fever and chills since Monday.  Other symptoms started about 9 days ago.  She tested positive for COVID-19 about 4 days ago.  She denied any chest pain or palpitations.  She has been having mild crampy abdominal pain with her diarrhea or melena or bright red blood per rectum.  Upon presentation to the emergency room, blood pressure was 85/36 with otherwise normal vital signs.  Later respiratory to 26 and pulse symmetry dropped to 87% and was later up to 94 then 95% and later 99% on 2 L of O2 by nasal cannula with a blood pressure 119/51.  Labs revealed positive COVID-19 PCR and negative influenza antigens.  Fibrin derivatives D-dimer came back 845.78 with lactic acid of 2.  Procalcitonin was 0.1.  CBC showed mild leukopenia and anemia.  LDH was elevated to 185 and high-sensitivity troponin I was 18 with BNP of 18.  CMP was remarkable for mild hypokalemia of 3.2 and elevated creatinine of 1.32 with AST of 79 and albumin 3.3..  Portable chest x-ray showed multifocal pneumonia likely viral or atypical etiology.  The patient was given IV Decadron and remdesivir as well as 41: P.o. potassium chloride, 4 mg IV Zofran and 1 L bolus of IV normal saline.   She will be admitted to a medical monitored isolation bed for further evaluation and management. PAST MEDICAL HISTORY:   Past Medical History:  Diagnosis Date  . Arthritis    hands and feet  . Cataract    right  . Diabetes mellitus    Borderline Diabetes  . GERD (gastroesophageal reflux disease)   . HTN (hypertension)   . Hyperlipidemia   . Motion sickness   . Post corneal transplant    right    PAST SURGICAL HISTORY:   Past Surgical History:  Procedure Laterality Date  . BASAL CELL CARCINOMA EXCISION     Dr. Manley Mason  . CARPAL TUNNEL RELEASE     right  . COLONOSCOPY WITH PROPOFOL N/A 12/03/2015   Procedure: COLONOSCOPY WITH PROPOFOL;  Surgeon: Manya Silvas, MD;  [facility] END[facility]COPY;  Service: Endoscopy;  Laterality: N/A;  . HAMMER TOE SURGERY Right 05/23/2019   Procedure: HAMMER TOE CORRECTION;  Surgeon: Samara Deist, DPM;  Location: [facility];  Service: Podiatry;  Laterality: Right;  Diabetic  . OSTECTOMY Right 05/23/2019   Procedure: DOUBLE OSTEOTOMY RIGHT;  Surgeon: Samara Deist, DPM;  Location: [facility];  Service: Podiatry;  Laterality: Right;  . SKIN CANCER EXCISION      SOCIAL HISTORY:   Social History   Tobacco Use  . Smoking status: Former Smoker    Quit date: 01/13/2009    Years since quitting: 10.6  . Smokeless tobacco:  Never Used  Substance Use Topics  . Alcohol use: No    FAMILY HISTORY:   Family History  Problem Relation Age of Onset  . Heart disease Mother   . Heart disease Father   . Heart disease Maternal Grandmother   . Heart disease Maternal Grandfather   . Breast cancer Sister 53    DRUG ALLERGIES:  No Known Allergies  REVIEW OF SYSTEMS:   ROS As per history of present illness. All pertinent systems were reviewed above. Constitutional,  HEENT, cardiovascular, respiratory, GI, GU, musculoskeletal, neuro, psychiatric, endocrine,  integumentary and hematologic systems were reviewed and are otherwise   negative/unremarkable except for positive findings mentioned above in the HPI.   MEDICATIONS AT HOME:   Prior to Admission medications   Medication Sig Start Date End Date Taking? Authorizing Provider  Ascorbic Acid (VITAMIN C) 1000 MG tablet Take 1,000 mg by mouth daily.     Yes [provider]  Blood Glucose Monitoring Suppl (TRUE METRIX AIR GLUCOSE METER) DEVI 1 each by Does not apply route daily. Check sugar once daily Dx E11.9 06/17/14  Yes Jackolyn Confer, MD  carvedilol (COREG) 6.25 MG tablet TAKE 1 TABLET TWICE DAILY WITH MEALS 10/07/15  Yes Jackolyn Confer, MD  docusate sodium (COLACE) 100 MG capsule Take 100 mg by mouth daily.    Yes [provider]  furosemide (LASIX) 40 MG tablet Take 1 tablet (40 mg total) by mouth daily. 12/20/14  Yes Jackolyn Confer, MD  glucose blood (TRUE METRIX BLOOD GLUCOSE TEST) test strip Check sugar once daily Dx E11.9 06/17/14  Yes Jackolyn Confer, MD  loratadine (ALLERGY) 10 MG tablet Take 10 mg by mouth daily.   Yes [provider]  losartan (COZAAR) 25 MG tablet TAKE 1 TABLET EVERY DAY 03/12/15  Yes Jackolyn Confer, MD  metFORMIN (GLUCOPHAGE) 500 MG tablet TAKE 1 TABLET TWICE DAILY WITH A MEAL 05/27/15  Yes Jackolyn Confer, MD  Multiple Vitamin (MULTIVITAMIN) capsule Take 1 capsule by mouth daily.     Yes [provider]  omeprazole (PRILOSEC) 20 MG capsule Take 1 capsule (20 mg total) by mouth daily. 12/20/14  Yes Jackolyn Confer, MD  prednisoLONE acetate (PRED MILD) 0.12 % ophthalmic suspension Place 1 drop into the right eye daily.   Yes [provider]  simvastatin (ZOCOR) 40 MG tablet TAKE 1 TABLET EVERY DAY  AT  6:00 PM 12/20/14  Yes Jackolyn Confer, MD  TRUEPLUS LANCETS 28G MISC Check sugar once daily Dx E11.9 06/17/14  Yes Jackolyn Confer, MD  aspirin 81 MG tablet Take 81 mg by mouth daily.      [provider]  meloxicam (MOBIC) 15 MG tablet TAKE 1 TABLET EVERY DAY Patient  not taking: Reported on 05/16/2019 10/07/15   Jackolyn Confer, MD  oxyCODONE-acetaminophen (PERCOCET) 5-325 MG tablet Take 1-2 tablets by mouth every 4 (four) hours as needed for severe pain. Patient not taking: Reported on 08/18/2019 05/23/19   Samara Deist, DPM      VITAL SIGNS:  Blood pressure (!) 119/56, pulse 86, temperature 97.7 F (36.5 C), temperature source Oral, resp. rate 18, height 4\' 11"  (1.499 m), weight 63.5 kg, SpO2 99 %.  PHYSICAL EXAMINATION:  Physical Exam  GENERAL:  69 y.o.-year-old Caucasian female patient lying in the bed with minimal respiratory distress with conversational dyspnea. EYES: Pupils equal, round, reactive to light and accommodation. No scleral icterus. Extraocular muscles intact.  HEENT: Head atraumatic, normocephalic. Oropharynx  and nasopharynx clear.  NECK:  Supple, no jugular venous distention. No thyroid enlargement, no tenderness.  LUNGS: Diminished bibasal breath sounds with bibasal crackles. CARDIOVASCULAR: Regular rate and rhythm, S1, S2 normal. No murmurs, rubs, or gallops.  ABDOMEN: Soft, nondistended, nontender. Bowel sounds present. No organomegaly or mass.  EXTREMITIES: No pedal edema, cyanosis, or clubbing.  NEUROLOGIC: Cranial nerves II through XII are intact. Muscle strength 5/5 in all extremities. Sensation intact. Gait not checked.  PSYCHIATRIC: The patient is alert and oriented x 3.  Normal affect and good eye contact. SKIN: No obvious rash, lesion, or ulcer.   LABORATORY PANEL:   CBC Recent Labs  Lab 08/18/19 0002  WBC 4.6  HGB 9.2*  HCT 27.5*  PLT 166   ------------------------------------------------------------------------------------------------------------------  Chemistries  Recent Labs  Lab 08/18/19 0002  NA 138  K 3.2*  CL 100  CO2 27  GLUCOSE 119*  BUN 23  CREATININE 1.32*  CALCIUM 8.0*  AST 79*  ALT 42  ALKPHOS 67  BILITOT 0.4    ------------------------------------------------------------------------------------------------------------------  Cardiac Enzymes No results for input(s): TROPONINI in the last 168 hours. ------------------------------------------------------------------------------------------------------------------  RADIOLOGY:  DG Chest Portable 1 View  Result Date: 08/18/2019 CLINICAL DATA:  69 year old female with positive COVID-19. Cough. EXAM: PORTABLE CHEST 1 VIEW COMPARISON:  None. FINDINGS: Bilateral confluent hazy densities as well as interstitial densities most consistent with multifocal pneumonia, likely viral or atypical in etiology and in keeping with COVID-19. There is no pleural effusion or pneumothorax. The cardiac silhouette is within limits. No acute osseous pathology. IMPRESSION: Multifocal pneumonia, likely viral or atypical in etiology. Clinical correlation and follow-up recommended. Electronically Signed   By: Anner Crete M.D.   On: 08/18/2019 03:19      IMPRESSION AND PLAN:   1.  Acute hypoxemic respiratory failure secondary to COVID-19. -The patient will be admitted to a medically monitored isolation bed. -O2 protocol will be followed to keep O2 saturation above 93.   2.  Multifocal pneumonia secondary to COVID-19. -The patient will be admitted to an isolation monitored bed with droplet and contact precautions. -O2 protocol will be followed. -We will follow CRP, ferritin, LDH and D-dimer. -Will follow manual differential for ANC/ALC ratio as well as follow troponin I and daily CBC with manual differential and CMP. - Will place the patient on IV Remdesivir and IV steroid therapy with Decadron with elevated inflammatory markers. -We will also start patient on p.o. Ivermectin. -The patient will be placed on vitamin D3, vitamin C, zinc sulfate, p.o. Pepcid and aspirin. -Actemra can be considered for CRP more than 7 with associated hypoxemia.  3.  Hypokalemia. -Potassium  will be replaced and magnesium level will be checked.  4.  Acute kidney injury. -This is likely prerenal secondary to volume depletion and dehydration with associated anorexia and diarrhea.  5.  Type 2 diabetes mellitus. -The patient will be placed on supplement coverage with NovoLog. -Metformin will be held off  6.  Dyslipidemia. -Statin therapy will be resumed.  5.  DVT prophylaxis. -Subcutaneous Lovenox.   All the records are reviewed and case discussed with ED provider. The plan of care was discussed in details with the patient (and family). I answered all questions. The patient agreed to proceed with the above mentioned plan. Further management will depend upon hospital course.   CODE STATUS: Full code  Status is: Inpatient  Remains inpatient appropriate because:Ongoing diagnostic testing needed not appropriate for outpatient work up, Unsafe d/c plan, IV treatments appropriate due to intensity  of illness or inability to take PO and Inpatient level of care appropriate due to severity of illness with COVID-19 and associated hypoxemia as well as intractable diarrhea diminished appetite subsequent dehydration and acute kidney injury   Dispo: The patient is from: Home              Anticipated d/c is to: Home              Anticipated d/c date is: 3 days              Patient currently is not medically stable to d/c.   TOTAL TIME TAKING CARE OF THIS PATIENT: 55 minutes.    Christel Mormon M.D on 08/18/2019 at 5:17 AM  [facility]   From 7 PM-7 AM, contact night-coverage [email or web address]  CC: Primary care physician; Lynnell Jude, MD   Note: This dictation was prepared with Dragon dictation along with smaller phrase technology. Any transcriptional errors that result from this process are unintentional.

## 2019-08-18 NOTE — Progress Notes (Signed)
Remdesivir - Pharmacy Brief Note   O:  CXR: IMPRESSION: Multifocal pneumonia, likely viral or atypical in etiology. Clinical correlation and follow-up recommended. SpO2: 94 - 99% on 2L [place]   A/P:  Remdesivir 200 mg IVPB once followed by 100 mg IVPB daily x 4 days.   Tobie Lords, PharmD, BCPS Clinical Pharmacist 08/18/2019 5:11 AM

## 2019-08-18 NOTE — ED Notes (Signed)
Pt ambulatory to the restroom with standby assistance.  °

## 2019-08-19 ENCOUNTER — Encounter: Payer: Self-pay | Admitting: Family Medicine

## 2019-08-19 DIAGNOSIS — E118 Type 2 diabetes mellitus with unspecified complications: Secondary | ICD-10-CM

## 2019-08-19 DIAGNOSIS — E7849 Other hyperlipidemia: Secondary | ICD-10-CM

## 2019-08-19 LAB — CBC WITH DIFFERENTIAL/PLATELET
Abs Immature Granulocytes: 0.18 10*3/uL — ABNORMAL HIGH (ref 0.00–0.07)
Basophils Absolute: 0 10*3/uL (ref 0.0–0.1)
Basophils Relative: 0 %
Eosinophils Absolute: 0 10*3/uL (ref 0.0–0.5)
Eosinophils Relative: 0 %
HCT: 24.9 % — ABNORMAL LOW (ref 36.0–46.0)
Hemoglobin: 8.4 g/dL — ABNORMAL LOW (ref 12.0–15.0)
Immature Granulocytes: 2 %
Lymphocytes Relative: 10 %
Lymphs Abs: 0.7 10*3/uL (ref 0.7–4.0)
MCH: 28.8 pg (ref 26.0–34.0)
MCHC: 33.7 g/dL (ref 30.0–36.0)
MCV: 85.3 fL (ref 80.0–100.0)
Monocytes Absolute: 0.5 10*3/uL (ref 0.1–1.0)
Monocytes Relative: 6 %
Neutro Abs: 6.4 10*3/uL (ref 1.7–7.7)
Neutrophils Relative %: 82 %
Platelets: 180 10*3/uL (ref 150–400)
RBC: 2.92 MIL/uL — ABNORMAL LOW (ref 3.87–5.11)
RDW: 15.6 % — ABNORMAL HIGH (ref 11.5–15.5)
WBC: 7.8 10*3/uL (ref 4.0–10.5)
nRBC: 0 % (ref 0.0–0.2)

## 2019-08-19 LAB — GLUCOSE, CAPILLARY
Glucose-Capillary: 123 mg/dL — ABNORMAL HIGH (ref 70–99)
Glucose-Capillary: 221 mg/dL — ABNORMAL HIGH (ref 70–99)
Glucose-Capillary: 164 mg/dL — ABNORMAL HIGH (ref 70–99)

## 2019-08-19 LAB — COMPREHENSIVE METABOLIC PANEL
ALT: 36 U/L (ref 0–44)
AST: 62 U/L — ABNORMAL HIGH (ref 15–41)
Albumin: 2.9 g/dL — ABNORMAL LOW (ref 3.5–5.0)
Alkaline Phosphatase: 61 U/L (ref 38–126)
Anion gap: 7 (ref 5–15)
BUN: 25 mg/dL — ABNORMAL HIGH (ref 8–23)
CO2: 25 mmol/L (ref 22–32)
Calcium: 8.2 mg/dL — ABNORMAL LOW (ref 8.9–10.3)
Chloride: 104 mmol/L (ref 98–111)
Creatinine, Ser: 0.88 mg/dL (ref 0.44–1.00)
GFR calc Af Amer: >60 mL/min (ref >60)
GFR calc non Af Amer: >60 mL/min (ref >60)
Glucose, Bld: 135 mg/dL — ABNORMAL HIGH (ref 70–99)
Potassium: 3.9 mmol/L (ref 3.5–5.1)
Sodium: 136 mmol/L (ref 135–145)
Total Bilirubin: 0.5 mg/dL (ref 0.3–1.2)
Total Protein: 6.1 g/dL — ABNORMAL LOW (ref 6.5–8.1)

## 2019-08-19 LAB — HEMOGLOBIN A1C
Hgb A1c MFr Bld: 6.4 % — ABNORMAL HIGH (ref 4.8–5.6)
Mean Plasma Glucose: 136.98 mg/dL

## 2019-08-19 LAB — FIBRIN DERIVATIVES D-DIMER (ARMC ONLY): Fibrin derivatives D-dimer (ARMC): 1104.99 ng/mL (FEU) — ABNORMAL HIGH (ref 0.00–499.00)

## 2019-08-19 LAB — FERRITIN: Ferritin: 161 ng/mL (ref 11–307)

## 2019-08-19 LAB — C-REACTIVE PROTEIN: CRP: 6 mg/dL — ABNORMAL HIGH (ref <1.0)

## 2019-08-19 MED ORDER — SODIUM CHLORIDE 0.9 % IV BOLUS
1000.0000 mL | Freq: Once | INTRAVENOUS | Status: AC
  Administered 2019-08-19: 11:00:00 1000 mL via INTRAVENOUS

## 2019-08-19 MED ORDER — MORPHINE SULFATE (PF) 2 MG/ML IV SOLN: 1.0000 mg | INTRAVENOUS | Status: DC | PRN

## 2019-08-19 MED ORDER — IVERMECTIN 3 MG PO TABS
200.0000 ug/kg | ORAL_TABLET | Freq: Once | ORAL | Status: AC
  Administered 2019-08-19: 14:00:00 12000 ug via ORAL
  Filled 2019-08-19: qty 4

## 2019-08-19 MED ORDER — BENZONATATE 100 MG PO CAPS: 200.0000 mg | ORAL_CAPSULE | Freq: Three times a day (TID) | ORAL | Status: DC | PRN

## 2019-08-19 MED ORDER — ACETAMINOPHEN 325 MG PO TABS
650.0000 mg | ORAL_TABLET | Freq: Four times a day (QID) | ORAL | Status: DC | PRN
  Administered 2019-08-20 – 2019-08-22 (×3): 650 mg via ORAL
  Filled 2019-08-19 (×2): qty 2

## 2019-08-19 MED ORDER — OXYCODONE-ACETAMINOPHEN 5-325 MG PO TABS: 1.0000 | ORAL_TABLET | Freq: Four times a day (QID) | ORAL | Status: DC | PRN

## 2019-08-19 NOTE — Progress Notes (Signed)
PROGRESS NOTE    Amber Glenn  LZJ:[id number] DOB: 1951-02-21 DOA: 08/18/2019 PCP: Lynnell Jude, MD       Assessment & Plan:   Active Problems:   Pneumonia due to COVID-19 virus   Acute hypoxemic respiratory failure: secondary to COVID-19. Increased oxygen demand today. Continue on supplemental oxygen and wean as tolerated   Multifocal pneumonia: secondary to COVID-19. Continue on IV remdesivir, decadron, vitamin c & zinc. Consult pharmacy for ivermectin x1. Airborne & contact precautions. Continue on bronchodilators. Encourage incentive spirometry and flutter valve. Increased oxygen demand today. Continue on supplemental oxygen and wean as tolerated  Hypokalemia: WNL today. Will continue to monitor   AKI: likely prerenal secondary to volume depletion and dehydration with associated anorexia and diarrhea. Resolved  DM2: continue on SSI w/ accuchecks. Hold home dose of metformin   Dyslipidemia: continue on statin   Normocytic anemia: no need for a transfusion currently. Will continue to monitor   DVT prophylaxis: lovenox Code Status: full  Family Communication:  Disposition Plan: likely d/c home vs home health   Consultants:      Procedures:    Antimicrobials:    Subjective: Pt c/o shortness of breath worse than yesterday.   Objective: Vitals:   08/18/19 1706 08/18/19 2103 08/19/19 0051 08/19/19 0753  BP: 112/61     Pulse: 88   79  Resp: 20  (!) 24   Temp:   98 F (36.7 C) (P) 98.3 F (36.8 C)  TempSrc:   Oral (P) Oral  SpO2: 95% (!) 73% 91%   Weight:      Height:       No intake or output data in the 24 hours ending 08/19/19 0755 Filed Weights   08/17/19 2358  Weight: 63.5 kg    Examination:  General exam: Appears calm and comfortable. Right eye erythema  Respiratory system: decreased breath sounds b/l. No wheezes Cardiovascular system: S1 & S2 +. No  rubs, gallops or clicks.  Gastrointestinal system: Abdomen is nondistended, soft and  nontender. Normal bowel sounds heard. Central nervous system: Alert and oriented. Moves all 4 extremities  Psychiatry: Judgement and insight appear normal. Mood & affect appropriate.     Data Reviewed: I have personally reviewed following labs and imaging studies  CBC: Recent Labs  Lab 08/18/19 0002 08/19/19 0112  WBC 4.6 7.8  NEUTROABS  --  6.4  HGB 9.2* 8.4*  HCT 27.5* 24.9*  MCV 85.1 85.3  PLT 166 024   Basic Metabolic Panel: Recent Labs  Lab 08/18/19 0002 08/19/19 0112  NA 138 136  K 3.2* 3.9  CL 100 104  CO2 27 25  GLUCOSE 119* 135*  BUN 23 25*  CREATININE 1.32* 0.88  CALCIUM 8.0* 8.2*   GFR: Estimated Creatinine Clearance: 48.2 mL/min (by C-G formula based on SCr of 0.88 mg/dL). Liver Function Tests: Recent Labs  Lab 08/18/19 0002 08/19/19 0112  AST 79* 62*  ALT 42 36  ALKPHOS 67 61  BILITOT 0.4 0.5  PROT 6.7 6.1*  ALBUMIN 3.3* 2.9*   Recent Labs  Lab 08/18/19 0002  LIPASE 40   No results for input(s): AMMONIA in the last 168 hours. Coagulation Profile: No results for input(s): INR, PROTIME in the last 168 hours. Cardiac Enzymes: No results for input(s): CKTOTAL, CKMB, CKMBINDEX, TROPONINI in the last 168 hours. BNP (last 3 results) No results for input(s): PROBNP in the last 8760 hours. HbA1C: No results for input(s): HGBA1C in the last 72 hours. CBG: Recent  Labs  Lab 08/18/19 0812 08/18/19 1144 08/18/19 2114 08/19/19 0752  GLUCAP 165* 202* 133* 123*   Lipid Profile: Recent Labs    08/18/19 0002  TRIG 179*   Thyroid Function Tests: No results for input(s): TSH, T4TOTAL, FREET4, T3FREE, THYROIDAB in the last 72 hours. Anemia Panel: Recent Labs    08/18/19 0002 08/19/19 0112  FERRITIN 147 161   Sepsis Labs: Recent Labs  Lab 08/18/19 0002 08/18/19 0342 08/18/19 0536  PROCALCITON 0.10  --   --   LATICACIDVEN  --  2.0* 0.7    Recent Results (from the past 240 hour(s))  Blood Culture (routine x 2)     Status: None  (Preliminary result)   Collection Time: 08/18/19  3:42 AM   Specimen: BLOOD  Result Value Ref Range Status   Specimen Description BLOOD BLOOD RIGHT HAND  Final   Special Requests   Final    BOTTLES DRAWN AEROBIC AND ANAEROBIC Blood Culture adequate volume   Culture   Final    NO GROWTH 1 DAY Performed at [facility], [address] [place], [place] [address]    Report Status PENDING  Incomplete  Blood Culture (routine x 2)     Status: None (Preliminary result)   Collection Time: 08/18/19  3:52 AM   Specimen: BLOOD  Result Value Ref Range Status   Specimen Description BLOOD BLOOD LEFT HAND  Final   Special Requests   Final    BOTTLES DRAWN AEROBIC AND ANAEROBIC Blood Culture adequate volume   Culture   Final    NO GROWTH 1 DAY Performed at [facility], [address], [place], [place] [address]    Report Status PENDING  Incomplete  Respiratory Panel by RT PCR (Flu A&B, Covid) - Nasopharyngeal Swab     Status: Abnormal   Collection Time: 08/18/19  4:01 AM   Specimen: Nasopharyngeal Swab  Result Value Ref Range Status   SARS Coronavirus 2 by RT PCR POSITIVE (A) NEGATIVE Final    Comment: RESULT CALLED TO, READ BACK BY AND VERIFIED WITH: BURCH WOODS 08/18/19 AT 0512 HS    Influenza A by PCR NEGATIVE NEGATIVE Final   Influenza B by PCR NEGATIVE NEGATIVE Final    Comment: (NOTE) The Xpert Xpress SARS-CoV-2/FLU/RSV assay is intended as an aid in  the diagnosis of influenza from Nasopharyngeal swab specimens and  should not be used as a sole basis for treatment. Nasal washings and  aspirates are unacceptable for Xpert Xpress SARS-CoV-2/FLU/RSV  testing. Fact Sheet for Patients: PinkCheek.be Fact Sheet for Healthcare Providers: GravelBags.it This test is not yet approved or cleared by the [place] FDA and  has been authorized for detection and/or diagnosis of SARS-CoV-2 by  FDA under an Emergency  Use Authorization (EUA). This EUA will remain  in effect (meaning this test can be used) for the duration of the  Covid-19 declaration under Section 564(b)(1) of the Act, 21  U.S.C. section 360bbb-3(b)(1), unless the authorization is  terminated or revoked. Performed at [facility], [address] [place], [place] [address]          Radiology Studies: DG Chest Portable 1 View  Result Date: 08/18/2019 CLINICAL DATA:  69 year old female with positive COVID-19. Cough. EXAM: PORTABLE CHEST 1 VIEW COMPARISON:  None. FINDINGS: Bilateral confluent hazy densities as well as interstitial densities most consistent with multifocal pneumonia, likely viral or atypical in etiology and in keeping with COVID-19. There is no pleural effusion or pneumothorax. The cardiac  silhouette is within limits. No acute osseous pathology. IMPRESSION: Multifocal pneumonia, likely viral or atypical in etiology. Clinical correlation and follow-up recommended. Electronically Signed   By: Anner Crete M.D.   On: 08/18/2019 03:19        Scheduled Meds: . vitamin C  1,000 mg Oral Daily  . aspirin EC  81 mg Oral Daily  . carvedilol  6.25 mg Oral BID WC  . cholecalciferol  1,000 Units Oral Daily  . dexamethasone (DECADRON) injection  6 mg Intravenous Q24H  . enoxaparin (LOVENOX) injection  40 mg Subcutaneous Q24H  . furosemide  40 mg Oral Daily  . guaiFENesin  600 mg Oral BID  . insulin aspart  0-9 Units Subcutaneous TID PC & HS  . loratadine  10 mg Oral Daily  . multivitamin with minerals  1 tablet Oral Daily  . pantoprazole  40 mg Oral Daily  . prednisoLONE acetate  1 drop Right Eye Daily  . simvastatin  40 mg Oral q1800   Continuous Infusions: . sodium chloride 75 mL/hr at 08/18/19 1752  . remdesivir 100 mg in NS 100 mL       LOS: 1 day    Time spent: 36 mins    Wyvonnia Dusky, MD [facility] Pager [phone]  If 7PM-7AM, please contact  night-coverage [email or web address] 08/19/2019, 7:55 AM

## 2019-08-20 LAB — CBC WITH DIFFERENTIAL/PLATELET
Abs Immature Granulocytes: 0.3 10*3/uL — ABNORMAL HIGH (ref 0.00–0.07)
Basophils Absolute: 0 10*3/uL (ref 0.0–0.1)
Basophils Relative: 0 %
Eosinophils Absolute: 0 10*3/uL (ref 0.0–0.5)
Eosinophils Relative: 0 %
HCT: 27.4 % — ABNORMAL LOW (ref 36.0–46.0)
Hemoglobin: 8.8 g/dL — ABNORMAL LOW (ref 12.0–15.0)
Immature Granulocytes: 4 %
Lymphocytes Relative: 11 %
Lymphs Abs: 0.8 10*3/uL (ref 0.7–4.0)
MCH: 27.8 pg (ref 26.0–34.0)
MCHC: 32.1 g/dL (ref 30.0–36.0)
MCV: 86.4 fL (ref 80.0–100.0)
Monocytes Absolute: 0.5 10*3/uL (ref 0.1–1.0)
Monocytes Relative: 7 %
Neutro Abs: 5.4 10*3/uL (ref 1.7–7.7)
Neutrophils Relative %: 78 %
Platelets: 243 10*3/uL (ref 150–400)
RBC: 3.17 MIL/uL — ABNORMAL LOW (ref 3.87–5.11)
RDW: 15.3 % (ref 11.5–15.5)
WBC: 7 10*3/uL (ref 4.0–10.5)
nRBC: 0 % (ref 0.0–0.2)

## 2019-08-20 LAB — COMPREHENSIVE METABOLIC PANEL
ALT: 37 U/L (ref 0–44)
AST: 59 U/L — ABNORMAL HIGH (ref 15–41)
Albumin: 3.1 g/dL — ABNORMAL LOW (ref 3.5–5.0)
Alkaline Phosphatase: 62 U/L (ref 38–126)
Anion gap: 9 (ref 5–15)
BUN: 25 mg/dL — ABNORMAL HIGH (ref 8–23)
CO2: 27 mmol/L (ref 22–32)
Calcium: 8.5 mg/dL — ABNORMAL LOW (ref 8.9–10.3)
Chloride: 102 mmol/L (ref 98–111)
Creatinine, Ser: 0.72 mg/dL (ref 0.44–1.00)
GFR calc Af Amer: >60 mL/min (ref >60)
GFR calc non Af Amer: >60 mL/min (ref >60)
Glucose, Bld: 142 mg/dL — ABNORMAL HIGH (ref 70–99)
Potassium: 3.8 mmol/L (ref 3.5–5.1)
Sodium: 138 mmol/L (ref 135–145)
Total Bilirubin: 0.5 mg/dL (ref 0.3–1.2)
Total Protein: 6.3 g/dL — ABNORMAL LOW (ref 6.5–8.1)

## 2019-08-20 LAB — GLUCOSE, CAPILLARY
Glucose-Capillary: 117 mg/dL — ABNORMAL HIGH (ref 70–99)
Glucose-Capillary: 136 mg/dL — ABNORMAL HIGH (ref 70–99)
Glucose-Capillary: 127 mg/dL — ABNORMAL HIGH (ref 70–99)
Glucose-Capillary: 173 mg/dL — ABNORMAL HIGH (ref 70–99)
Glucose-Capillary: 202 mg/dL — ABNORMAL HIGH (ref 70–99)

## 2019-08-20 LAB — C-REACTIVE PROTEIN: CRP: 2.7 mg/dL — ABNORMAL HIGH (ref <1.0)

## 2019-08-20 LAB — FERRITIN: Ferritin: 138 ng/mL (ref 11–307)

## 2019-08-20 LAB — FIBRIN DERIVATIVES D-DIMER (ARMC ONLY): Fibrin derivatives D-dimer (ARMC): 858.13 ng/mL (FEU) — ABNORMAL HIGH (ref 0.00–499.00)

## 2019-08-20 NOTE — Progress Notes (Addendum)
PROGRESS NOTE    Amber Glenn  PJA:[id number] DOB: 01/29/1951 DOA: 08/18/2019 PCP: Lynnell Jude, MD       Assessment & Plan:   Active Problems:   Pneumonia due to COVID-19 virus   Acute hypoxemic respiratory failure: secondary to COVID-19. Currently on 8L [place], increased oxygen demand. Continue on supplemental oxygen and wean as tolerated   Multifocal pneumonia: secondary to COVID-19. Continue on IV remdesivir, decadron, vitamin c & zinc. S/p ivermectin x1 on 08/19/19. Inflammatory markers are elevated. Airborne & contact precautions. Continue on bronchodilators. Encourage incentive spirometry and flutter valve. Increased oxygen demand again today. Continue on supplemental oxygen and wean as tolerated  Hypokalemia: WNL today. Will continue to monitor   AKI: likely prerenal secondary to volume depletion and dehydration with associated anorexia and diarrhea. Resolved  DM2: continue on SSI w/ accuchecks. Hold home dose of metformin   Dyslipidemia: continue on statin   Normocytic anemia: no need for a transfusion currently. Will continue to monitor   DVT prophylaxis: lovenox Code Status: full  Family Communication: discussed pt's care w/ pt's daughter, [name], and answered her questions  Disposition Plan: likely d/c home vs home health   Consultants:      Procedures:    Antimicrobials:    Subjective: Pt c/o shortness of breath still.  Objective: Vitals:   08/19/19 0051 08/19/19 0753 08/19/19 1224 08/20/19 0019  BP:  (!) 94/31 (!) 137/51 123/61  Pulse:  79  73  Resp: (!) 24   18  Temp: 98 F (36.7 C) 98.3 F (36.8 C)  97.6 F (36.4 C)  TempSrc: Oral Oral  Oral  SpO2: 91%   92%  Weight:      Height:        Intake/Output Summary (Last 24 hours) at 08/20/2019 0756 Last data filed at 08/20/2019 0600 Gross per 24 hour  Intake 100 ml  Output --  Net 100 ml   Filed Weights   08/17/19 2358  Weight: 63.5 kg    Examination:  General exam: Appears  calm and comfortable. Right eye erythema  Respiratory system: diminished breath sounds b/l. No rales Cardiovascular system: S1 & S2 +. No  rubs, gallops or clicks.  Gastrointestinal system: Abdomen is nondistended, soft and nontender. hypoactive bowel sounds heard. Central nervous system: Alert and oriented. Moves all 4 extremities  Psychiatry: Judgement and insight appear normal. Mood & affect appropriate.     Data Reviewed: I have personally reviewed following labs and imaging studies  CBC: Recent Labs  Lab 08/18/19 0002 08/19/19 0112 08/20/19 0556  WBC 4.6 7.8 7.0  NEUTROABS  --  6.4 5.4  HGB 9.2* 8.4* 8.8*  HCT 27.5* 24.9* 27.4*  MCV 85.1 85.3 86.4  PLT 166 180 341   Basic Metabolic Panel: Recent Labs  Lab 08/18/19 0002 08/19/19 0112 08/20/19 0556  NA 138 136 138  K 3.2* 3.9 3.8  CL 100 104 102  CO2 27 25 27   GLUCOSE 119* 135* 142*  BUN 23 25* 25*  CREATININE 1.32* 0.88 0.72  CALCIUM 8.0* 8.2* 8.5*   GFR: Estimated Creatinine Clearance: 53 mL/min (by C-G formula based on SCr of 0.72 mg/dL). Liver Function Tests: Recent Labs  Lab 08/18/19 0002 08/19/19 0112 08/20/19 0556  AST 79* 62* 59*  ALT 42 36 37  ALKPHOS 67 61 62  BILITOT 0.4 0.5 0.5  PROT 6.7 6.1* 6.3*  ALBUMIN 3.3* 2.9* 3.1*   Recent Labs  Lab 08/18/19 0002  LIPASE 40   No  results for input(s): AMMONIA in the last 168 hours. Coagulation Profile: No results for input(s): INR, PROTIME in the last 168 hours. Cardiac Enzymes: No results for input(s): CKTOTAL, CKMB, CKMBINDEX, TROPONINI in the last 168 hours. BNP (last 3 results) No results for input(s): PROBNP in the last 8760 hours. HbA1C: Recent Labs    08/19/19 0112  HGBA1C 6.4*   CBG: Recent Labs  Lab 08/19/19 0752 08/19/19 1142 08/19/19 1707 08/19/19 2106 08/20/19 0734  GLUCAP 123* 117* 221* 164* 136*   Lipid Profile: Recent Labs    08/18/19 0002  TRIG 179*   Thyroid Function Tests: No results for input(s): TSH,  T4TOTAL, FREET4, T3FREE, THYROIDAB in the last 72 hours. Anemia Panel: Recent Labs    08/19/19 0112 08/20/19 0556  FERRITIN 161 138   Sepsis Labs: Recent Labs  Lab 08/18/19 0002 08/18/19 0342 08/18/19 0536  PROCALCITON 0.10  --   --   LATICACIDVEN  --  2.0* 0.7    Recent Results (from the past 240 hour(s))  Blood Culture (routine x 2)     Status: None (Preliminary result)   Collection Time: 08/18/19  3:42 AM   Specimen: BLOOD  Result Value Ref Range Status   Specimen Description BLOOD BLOOD RIGHT HAND  Final   Special Requests   Final    BOTTLES DRAWN AEROBIC AND ANAEROBIC Blood Culture adequate volume   Culture   Final    NO GROWTH 2 DAYS Performed at [facility], [address] [place], [place] [address]    Report Status PENDING  Incomplete  Blood Culture (routine x 2)     Status: None (Preliminary result)   Collection Time: 08/18/19  3:52 AM   Specimen: BLOOD  Result Value Ref Range Status   Specimen Description BLOOD BLOOD LEFT HAND  Final   Special Requests   Final    BOTTLES DRAWN AEROBIC AND ANAEROBIC Blood Culture adequate volume   Culture   Final    NO GROWTH 2 DAYS Performed at [facility], [address], [place], [place] [address]    Report Status PENDING  Incomplete  Respiratory Panel by RT PCR (Flu A&B, Covid) - Nasopharyngeal Swab     Status: Abnormal   Collection Time: 08/18/19  4:01 AM   Specimen: Nasopharyngeal Swab  Result Value Ref Range Status   SARS Coronavirus 2 by RT PCR POSITIVE (A) NEGATIVE Final    Comment: RESULT CALLED TO, READ BACK BY AND VERIFIED WITH: BURCH WOODS 08/18/19 AT 0512 HS    Influenza A by PCR NEGATIVE NEGATIVE Final   Influenza B by PCR NEGATIVE NEGATIVE Final    Comment: (NOTE) The Xpert Xpress SARS-CoV-2/FLU/RSV assay is intended as an aid in  the diagnosis of influenza from Nasopharyngeal swab specimens and  should not be used as a sole basis for treatment. Nasal washings and  aspirates are  unacceptable for Xpert Xpress SARS-CoV-2/FLU/RSV  testing. Fact Sheet for Patients: PinkCheek.be Fact Sheet for Healthcare Providers: GravelBags.it This test is not yet approved or cleared by the [place] FDA and  has been authorized for detection and/or diagnosis of SARS-CoV-2 by  FDA under an Emergency Use Authorization (EUA). This EUA will remain  in effect (meaning this test can be used) for the duration of the  Covid-19 declaration under Section 564(b)(1) of the Act, 21  U.S.C. section 360bbb-3(b)(1), unless the authorization is  terminated or revoked. Performed at [facility], [address] [place], [place] [address]  Radiology Studies: No results found.      Scheduled Meds: . vitamin C  1,000 mg Oral Daily  . aspirin EC  81 mg Oral Daily  . carvedilol  6.25 mg Oral BID WC  . cholecalciferol  1,000 Units Oral Daily  . dexamethasone (DECADRON) injection  6 mg Intravenous Q24H  . enoxaparin (LOVENOX) injection  40 mg Subcutaneous Q24H  . furosemide  40 mg Oral Daily  . guaiFENesin  600 mg Oral BID  . insulin aspart  0-9 Units Subcutaneous TID PC & HS  . loratadine  10 mg Oral Daily  . multivitamin with minerals  1 tablet Oral Daily  . pantoprazole  40 mg Oral Daily  . prednisoLONE acetate  1 drop Right Eye Daily  . simvastatin  40 mg Oral q1800   Continuous Infusions: . remdesivir 100 mg in NS 100 mL Stopped (08/19/19 1249)     LOS: 2 days    Time spent: 34 mins    Wyvonnia Dusky, MD [facility] Pager [phone]  If 7PM-7AM, please contact night-coverage [email or web address] 08/20/2019, 7:56 AM

## 2019-08-21 DIAGNOSIS — J9621 Acute and chronic respiratory failure with hypoxia: Secondary | ICD-10-CM

## 2019-08-21 LAB — COMPREHENSIVE METABOLIC PANEL
ALT: 33 U/L (ref 0–44)
AST: 45 U/L — ABNORMAL HIGH (ref 15–41)
Albumin: 2.9 g/dL — ABNORMAL LOW (ref 3.5–5.0)
Alkaline Phosphatase: 55 U/L (ref 38–126)
Anion gap: 10 (ref 5–15)
BUN: 26 mg/dL — ABNORMAL HIGH (ref 8–23)
CO2: 29 mmol/L (ref 22–32)
Calcium: 8.6 mg/dL — ABNORMAL LOW (ref 8.9–10.3)
Chloride: 100 mmol/L (ref 98–111)
Creatinine, Ser: 0.72 mg/dL (ref 0.44–1.00)
GFR calc Af Amer: >60 mL/min (ref >60)
GFR calc non Af Amer: >60 mL/min (ref >60)
Glucose, Bld: 122 mg/dL — ABNORMAL HIGH (ref 70–99)
Potassium: 3.6 mmol/L (ref 3.5–5.1)
Sodium: 139 mmol/L (ref 135–145)
Total Bilirubin: 0.7 mg/dL (ref 0.3–1.2)
Total Protein: 6.1 g/dL — ABNORMAL LOW (ref 6.5–8.1)

## 2019-08-21 LAB — CBC WITH DIFFERENTIAL/PLATELET
Abs Immature Granulocytes: 0.29 10*3/uL — ABNORMAL HIGH (ref 0.00–0.07)
Basophils Absolute: 0 10*3/uL (ref 0.0–0.1)
Basophils Relative: 0 %
Eosinophils Absolute: 0 10*3/uL (ref 0.0–0.5)
Eosinophils Relative: 0 %
HCT: 25 % — ABNORMAL LOW (ref 36.0–46.0)
Hemoglobin: 8.2 g/dL — ABNORMAL LOW (ref 12.0–15.0)
Immature Granulocytes: 4 %
Lymphocytes Relative: 15 %
Lymphs Abs: 1 10*3/uL (ref 0.7–4.0)
MCH: 27.9 pg (ref 26.0–34.0)
MCHC: 32.8 g/dL (ref 30.0–36.0)
MCV: 85 fL (ref 80.0–100.0)
Monocytes Absolute: 0.6 10*3/uL (ref 0.1–1.0)
Monocytes Relative: 9 %
Neutro Abs: 5 10*3/uL (ref 1.7–7.7)
Neutrophils Relative %: 72 %
Platelets: 252 10*3/uL (ref 150–400)
RBC: 2.94 MIL/uL — ABNORMAL LOW (ref 3.87–5.11)
RDW: 15 % (ref 11.5–15.5)
Smear Review: NORMAL
WBC: 7 10*3/uL (ref 4.0–10.5)
nRBC: 0.3 % — ABNORMAL HIGH (ref 0.0–0.2)

## 2019-08-21 LAB — C-REACTIVE PROTEIN: CRP: 1.4 mg/dL — ABNORMAL HIGH (ref <1.0)

## 2019-08-21 LAB — GLUCOSE, CAPILLARY
Glucose-Capillary: 100 mg/dL — ABNORMAL HIGH (ref 70–99)
Glucose-Capillary: 144 mg/dL — ABNORMAL HIGH (ref 70–99)
Glucose-Capillary: 190 mg/dL — ABNORMAL HIGH (ref 70–99)
Glucose-Capillary: 155 mg/dL — ABNORMAL HIGH (ref 70–99)

## 2019-08-21 LAB — FERRITIN: Ferritin: 95 ng/mL (ref 11–307)

## 2019-08-21 LAB — FIBRIN DERIVATIVES D-DIMER (ARMC ONLY): Fibrin derivatives D-dimer (ARMC): 626 ng/mL (FEU) — ABNORMAL HIGH (ref 0.00–499.00)

## 2019-08-21 NOTE — Care Management Important Message (Signed)
Important Message  Patient Details  Name: Amber Glenn MRN: [id number] Date of Birth: [birth year]   Medicare Important Message Given:  Yes     Shelbie Hutching, RN 08/21/2019, 3:35 PM

## 2019-08-21 NOTE — Plan of Care (Signed)
  Problem: Education: Goal: Knowledge of risk factors and measures for prevention of condition will improve Outcome: Progressing   Problem: Coping: Goal: Psychosocial and spiritual needs will be supported Outcome: Progressing   Problem: Respiratory: Goal: Will maintain a patent airway Outcome: Progressing Goal: Complications related to the disease process, condition or treatment will be avoided or minimized Outcome: Progressing   

## 2019-08-21 NOTE — Progress Notes (Deleted)
Ambulated patient in room. Patient on 3L [place], desats to 83%. Oxygen turned up to 5L [place] and patient returned to bed, oxygen saturation quickly recovers to above 92%. Patient did not experience any shortness of breath. All this relayed to Dr. Jimmye Norman. Will continue to monitor patient closely.

## 2019-08-21 NOTE — Progress Notes (Signed)
PROGRESS NOTE    Amber Glenn  JIR:[id number] DOB: Feb 17, 1951 DOA: 08/18/2019 PCP: Lynnell Jude, MD   HPI was taken from Dr. Sidney Ace: Amber Glenn  is a 69 y.o. Caucasian female with a known history of type 2 diabetes mellitus, hypertension, dyslipidemia and GERD, who presented to the emergency room with acute onset of worsening dyspnea, dry cough with associated sore throat malaise with diminished appetite and significant diarrhea as well as fatigue and tiredness.  She denied any loss of taste or smell.  She admitted to intermittent fever and chills since Monday.  Other symptoms started about 9 days ago.  She tested positive for COVID-19 about 4 days ago.  She denied any chest pain or palpitations.  She has been having mild crampy abdominal pain with her diarrhea or melena or bright red blood per rectum.  Upon presentation to the emergency room, blood pressure was 85/36 with otherwise normal vital signs.  Later respiratory to 26 and pulse symmetry dropped to 87% and was later up to 94 then 95% and later 99% on 2 L of O2 by nasal cannula with a blood pressure 119/51.  Labs revealed positive COVID-19 PCR and negative influenza antigens.  Fibrin derivatives D-dimer came back 845.78 with lactic acid of 2.  Procalcitonin was 0.1.  CBC showed mild leukopenia and anemia.  LDH was elevated to 185 and high-sensitivity troponin I was 18 with BNP of 18.  CMP was remarkable for mild hypokalemia of 3.2 and elevated creatinine of 1.32 with AST of 79 and albumin 3.3..  Portable chest x-ray showed multifocal pneumonia likely viral or atypical etiology.  The patient was given IV Decadron and remdesivir as well as 41: P.o. potassium chloride, 4 mg IV Zofran and 1 L bolus of IV normal saline.  She will be admitted to a medical monitored isolation bed for further evaluation and management.    Assessment & Plan:   Active Problems:   Pneumonia due to COVID-19 virus   Acute hypoxemic respiratory failure:  secondary to COVID-19. Currently on 5L HFNC, decreased oxygen demand today. Continue on supplemental oxygen and wean as tolerated   Multifocal pneumonia: secondary to COVID-19. Continue on IV remdesivir, decadron, vitamin c & zinc. S/p ivermectin x1 on 08/19/19. Inflammatory markers are elevated. Airborne & contact precautions. Continue on bronchodilators. Encourage incentive spirometry and flutter valve. Decreased oxygen demand today. Continue on supplemental oxygen and wean as tolerated  Hypokalemia: WNL today. Will continue to monitor   AKI: likely prerenal secondary to volume depletion and dehydration with associated anorexia and diarrhea. Resolved  DM2: continue on SSI w/ accuchecks. Hold home dose of metformin   Dyslipidemia: continue on statin   Normocytic anemia: no need for a transfusion currently. Will continue to monitor   DVT prophylaxis: lovenox Code Status: full  Family Communication: discussed pt's care w/ pt's daughter, [name], and answered her questions. Please call her for updates  Disposition Plan: likely d/c home vs home health   Consultants:      Procedures:    Antimicrobials:    Subjective: Pt c/o shortness of breath but improved from day prior.   Objective: Vitals:   08/20/19 1700 08/20/19 2000 08/21/19 0010 08/21/19 0552  BP: (!) 142/70  (!) 115/57   Pulse:   77   Resp: 18  20   Temp: 98.5 F (36.9 C)  97.9 F (36.6 C)   TempSrc: Oral  Oral   SpO2: 94% 92% 94% 95%  Weight:  Height:        Intake/Output Summary (Last 24 hours) at 08/21/2019 0747 Last data filed at 08/20/2019 1500 Gross per 24 hour  Intake 100 ml  Output --  Net 100 ml   Filed Weights   08/17/19 2358  Weight: 63.5 kg    Examination:  General exam: Appears calm and comfortable. Right eye erythema  Respiratory system: decreased breath sounds b/l. No wheezes Cardiovascular system: S1 & S2 +. No  rubs, gallops or clicks.  Gastrointestinal system: Abdomen is  nondistended, soft and nontender. Normal bowel sounds heard. Central nervous system: Alert and oriented. Moves all 4 extremities  Psychiatry: Judgement and insight appear normal. Mood & affect appropriate.     Data Reviewed: I have personally reviewed following labs and imaging studies  CBC: Recent Labs  Lab 08/18/19 0002 08/19/19 0112 08/20/19 0556 08/21/19 0447  WBC 4.6 7.8 7.0 7.0  NEUTROABS  --  6.4 5.4 5.0  HGB 9.2* 8.4* 8.8* 8.2*  HCT 27.5* 24.9* 27.4* 25.0*  MCV 85.1 85.3 86.4 85.0  PLT 166 180 243 294   Basic Metabolic Panel: Recent Labs  Lab 08/18/19 0002 08/19/19 0112 08/20/19 0556 08/21/19 0447  NA 138 136 138 139  K 3.2* 3.9 3.8 3.6  CL 100 104 102 100  CO2 27 25 27 29   GLUCOSE 119* 135* 142* 122*  BUN 23 25* 25* 26*  CREATININE 1.32* 0.88 0.72 0.72  CALCIUM 8.0* 8.2* 8.5* 8.6*   GFR: Estimated Creatinine Clearance: 53 mL/min (by C-G formula based on SCr of 0.72 mg/dL). Liver Function Tests: Recent Labs  Lab 08/18/19 0002 08/19/19 0112 08/20/19 0556 08/21/19 0447  AST 79* 62* 59* 45*  ALT 42 36 37 33  ALKPHOS 67 61 62 55  BILITOT 0.4 0.5 0.5 0.7  PROT 6.7 6.1* 6.3* 6.1*  ALBUMIN 3.3* 2.9* 3.1* 2.9*   Recent Labs  Lab 08/18/19 0002  LIPASE 40   No results for input(s): AMMONIA in the last 168 hours. Coagulation Profile: No results for input(s): INR, PROTIME in the last 168 hours. Cardiac Enzymes: No results for input(s): CKTOTAL, CKMB, CKMBINDEX, TROPONINI in the last 168 hours. BNP (last 3 results) No results for input(s): PROBNP in the last 8760 hours. HbA1C: Recent Labs    08/19/19 0112  HGBA1C 6.4*   CBG: Recent Labs  Lab 08/19/19 2106 08/20/19 0734 08/20/19 1203 08/20/19 1723 08/20/19 2100  GLUCAP 164* 136* 127* 173* 202*   Lipid Profile: No results for input(s): CHOL, HDL, LDLCALC, TRIG, CHOLHDL, LDLDIRECT in the last 72 hours. Thyroid Function Tests: No results for input(s): TSH, T4TOTAL, FREET4, T3FREE, THYROIDAB  in the last 72 hours. Anemia Panel: Recent Labs    08/20/19 0556 08/21/19 0447  FERRITIN 138 95   Sepsis Labs: Recent Labs  Lab 08/18/19 0002 08/18/19 0342 08/18/19 0536  PROCALCITON 0.10  --   --   LATICACIDVEN  --  2.0* 0.7    Recent Results (from the past 240 hour(s))  Blood Culture (routine x 2)     Status: None (Preliminary result)   Collection Time: 08/18/19  3:42 AM   Specimen: BLOOD  Result Value Ref Range Status   Specimen Description BLOOD BLOOD RIGHT HAND  Final   Special Requests   Final    BOTTLES DRAWN AEROBIC AND ANAEROBIC Blood Culture adequate volume   Culture   Final    NO GROWTH 3 DAYS Performed at [facility], [address] [place], [place] [address]  Report Status PENDING  Incomplete  Blood Culture (routine x 2)     Status: None (Preliminary result)   Collection Time: 08/18/19  3:52 AM   Specimen: BLOOD  Result Value Ref Range Status   Specimen Description BLOOD BLOOD LEFT HAND  Final   Special Requests   Final    BOTTLES DRAWN AEROBIC AND ANAEROBIC Blood Culture adequate volume   Culture   Final    NO GROWTH 3 DAYS Performed at [facility], [address] [place], [place] [address]    Report Status PENDING  Incomplete  Respiratory Panel by RT PCR (Flu A&B, Covid) - Nasopharyngeal Swab     Status: Abnormal   Collection Time: 08/18/19  4:01 AM   Specimen: Nasopharyngeal Swab  Result Value Ref Range Status   SARS Coronavirus 2 by RT PCR POSITIVE (A) NEGATIVE Final    Comment: RESULT CALLED TO, READ BACK BY AND VERIFIED WITH: BURCH WOODS 08/18/19 AT 0512 HS    Influenza A by PCR NEGATIVE NEGATIVE Final   Influenza B by PCR NEGATIVE NEGATIVE Final    Comment: (NOTE) The Xpert Xpress SARS-CoV-2/FLU/RSV assay is intended as an aid in  the diagnosis of influenza from Nasopharyngeal swab specimens and  should not be used as a sole basis for treatment. Nasal washings and  aspirates are unacceptable for Xpert Xpress  SARS-CoV-2/FLU/RSV  testing. Fact Sheet for Patients: PinkCheek.be Fact Sheet for Healthcare Providers: GravelBags.it This test is not yet approved or cleared by the [place] FDA and  has been authorized for detection and/or diagnosis of SARS-CoV-2 by  FDA under an Emergency Use Authorization (EUA). This EUA will remain  in effect (meaning this test can be used) for the duration of the  Covid-19 declaration under Section 564(b)(1) of the Act, 21  U.S.C. section 360bbb-3(b)(1), unless the authorization is  terminated or revoked. Performed at [facility], [address], [place], [place] [address]          Radiology Studies: No results found.      Scheduled Meds: . vitamin C  1,000 mg Oral Daily  . aspirin EC  81 mg Oral Daily  . carvedilol  6.25 mg Oral BID WC  . cholecalciferol  1,000 Units Oral Daily  . dexamethasone (DECADRON) injection  6 mg Intravenous Q24H  . enoxaparin (LOVENOX) injection  40 mg Subcutaneous Q24H  . furosemide  40 mg Oral Daily  . guaiFENesin  600 mg Oral BID  . insulin aspart  0-9 Units Subcutaneous TID PC & HS  . loratadine  10 mg Oral Daily  . multivitamin with minerals  1 tablet Oral Daily  . pantoprazole  40 mg Oral Daily  . prednisoLONE acetate  1 drop Right Eye Daily  . simvastatin  40 mg Oral q1800   Continuous Infusions: . remdesivir 100 mg in NS 100 mL Stopped (08/20/19 1006)     LOS: 3 days    Time spent: 32 mins    Wyvonnia Dusky, MD [facility] Pager [phone]  If 7PM-7AM, please contact night-coverage [email or web address] 08/21/2019, 7:47 AM

## 2019-08-22 DIAGNOSIS — J189 Pneumonia, unspecified organism: Secondary | ICD-10-CM

## 2019-08-22 DIAGNOSIS — R197 Diarrhea, unspecified: Secondary | ICD-10-CM

## 2019-08-22 DIAGNOSIS — J22 Unspecified acute lower respiratory infection: Secondary | ICD-10-CM

## 2019-08-22 LAB — COMPREHENSIVE METABOLIC PANEL
ALT: 33 U/L (ref 0–44)
AST: 37 U/L (ref 15–41)
Albumin: 2.9 g/dL — ABNORMAL LOW (ref 3.5–5.0)
Alkaline Phosphatase: 59 U/L (ref 38–126)
Anion gap: 8 (ref 5–15)
BUN: 24 mg/dL — ABNORMAL HIGH (ref 8–23)
CO2: 30 mmol/L (ref 22–32)
Calcium: 8.4 mg/dL — ABNORMAL LOW (ref 8.9–10.3)
Chloride: 98 mmol/L (ref 98–111)
Creatinine, Ser: 0.7 mg/dL (ref 0.44–1.00)
GFR calc Af Amer: >60 mL/min (ref >60)
GFR calc non Af Amer: >60 mL/min (ref >60)
Glucose, Bld: 120 mg/dL — ABNORMAL HIGH (ref 70–99)
Potassium: 3 mmol/L — ABNORMAL LOW (ref 3.5–5.1)
Sodium: 136 mmol/L (ref 135–145)
Total Bilirubin: 0.6 mg/dL (ref 0.3–1.2)
Total Protein: 6 g/dL — ABNORMAL LOW (ref 6.5–8.1)

## 2019-08-22 LAB — CBC WITH DIFFERENTIAL/PLATELET
Abs Immature Granulocytes: 0.34 10*3/uL — ABNORMAL HIGH (ref 0.00–0.07)
Basophils Absolute: 0 10*3/uL (ref 0.0–0.1)
Basophils Relative: 0 %
Eosinophils Absolute: 0 10*3/uL (ref 0.0–0.5)
Eosinophils Relative: 0 %
HCT: 24.9 % — ABNORMAL LOW (ref 36.0–46.0)
Hemoglobin: 8.4 g/dL — ABNORMAL LOW (ref 12.0–15.0)
Immature Granulocytes: 3 %
Lymphocytes Relative: 11 %
Lymphs Abs: 1.1 10*3/uL (ref 0.7–4.0)
MCH: 28.4 pg (ref 26.0–34.0)
MCHC: 33.7 g/dL (ref 30.0–36.0)
MCV: 84.1 fL (ref 80.0–100.0)
Monocytes Absolute: 0.7 10*3/uL (ref 0.1–1.0)
Monocytes Relative: 7 %
Neutro Abs: 7.9 10*3/uL — ABNORMAL HIGH (ref 1.7–7.7)
Neutrophils Relative %: 79 %
Platelets: 310 10*3/uL (ref 150–400)
RBC: 2.96 MIL/uL — ABNORMAL LOW (ref 3.87–5.11)
RDW: 15.2 % (ref 11.5–15.5)
WBC: 10.1 10*3/uL (ref 4.0–10.5)
nRBC: 0.2 % (ref 0.0–0.2)

## 2019-08-22 LAB — MAGNESIUM: Magnesium: 2 mg/dL (ref 1.7–2.4)

## 2019-08-22 LAB — FERRITIN: Ferritin: 64 ng/mL (ref 11–307)

## 2019-08-22 LAB — GLUCOSE, CAPILLARY
Glucose-Capillary: 96 mg/dL (ref 70–99)
Glucose-Capillary: 152 mg/dL — ABNORMAL HIGH (ref 70–99)

## 2019-08-22 LAB — FIBRIN DERIVATIVES D-DIMER (ARMC ONLY): Fibrin derivatives D-dimer (ARMC): 699.85 ng/mL (FEU) — ABNORMAL HIGH (ref 0.00–499.00)

## 2019-08-22 LAB — C-REACTIVE PROTEIN: CRP: 1.6 mg/dL — ABNORMAL HIGH (ref <1.0)

## 2019-08-22 MED ORDER — GUAIFENESIN-DM 100-10 MG/5ML PO SYRP: 10.0000 mL | ORAL_SOLUTION | ORAL | 0 refills | Status: DC | PRN

## 2019-08-22 MED ORDER — PREDNISONE 10 MG (21) PO TBPK
ORAL_TABLET | ORAL | 0 refills | Status: DC
Start: 2019-08-22 — End: 2022-02-18

## 2019-08-22 MED ORDER — ZINC SULFATE 220 (50 ZN) MG PO CAPS
220.0000 mg | ORAL_CAPSULE | Freq: Two times a day (BID) | ORAL | 0 refills | Status: DC
Start: 2019-08-22 — End: 2022-02-18

## 2019-08-22 MED ORDER — PHENOL 1.4 % MT LIQD: 1.0000 | OROMUCOSAL | 0 refills | Status: DC | PRN

## 2019-08-22 MED ORDER — POTASSIUM CHLORIDE CRYS ER 20 MEQ PO TBCR
40.0000 meq | EXTENDED_RELEASE_TABLET | Freq: Once | ORAL | Status: AC
  Administered 2019-08-22: 40 meq via ORAL
  Filled 2019-08-22: qty 2

## 2019-08-22 NOTE — Progress Notes (Addendum)
Discharge instructions provided. All questions answered. Vital sings assessed and stable. Patient is on 3L and will be discharged on oxygen per MD orders. Oxygen delivered and patient taught how to operate it, oxygen in place for dc at 3L [place] as per MD order. IV removed. All belongings gathered with patient. Awaiting transport. Breathing is even and unlabored. No distress is noted. Will continue to monitor. All safety measures are in place.

## 2019-08-22 NOTE — Progress Notes (Signed)
SATURATION QUALIFICATIONS: (This note is used to comply with regulatory documentation for home oxygen)  Patient Saturations on Room Air at Rest = 89%  Patient Saturations on Room Air while Ambulating = 83%  Patient Saturations on 3 Liters of oxygen while Ambulating = 89%  Please briefly explain why patient needs home oxygen: Patient O2 sats while walking with 3L are 89-92%. No SOB.

## 2019-08-22 NOTE — Progress Notes (Signed)
Alerted Dr. Manuella Ghazi that patient was ambulated in her room on 3L. O2 saturation between 89-92% on 3L, no shortness of breath. On room air, patient's O2 sat was 83%. Quickly recovered to above 90% on 3L. Dr. Manuella Ghazi stated that patient would dc today on 3L. Will continue to monitor.

## 2019-08-22 NOTE — TOC Transition Note (Signed)
Transition of Care [facility]) - CM/SW Discharge Note   Patient Details  Name: Amber Glenn MRN: [id number] Date of Birth: 02-11-1951  Transition of Care [facility] CM/SW Contact:  Shelbie Hutching, RN Phone Number: 08/22/2019, 2:28 PM   Clinical Narrative:     Patient's oxygen has been weaned down to 3 L [place].  Patient will require oxygen at home.  Order for O2 sent to [employer].  Andree Coss with [employer] will have the oxygen delivered to the patient's room.  Oxygen should arrive before 4pm.  Patient lives with her daughter and granddaughter.  Her granddaughter will be picking her up.  Patient is independent at home.   Final next level of care: Home/Self Care Barriers to Discharge: No Barriers Identified   Patient Goals and CMS Choice        Discharge Placement                       Discharge Plan and Services                DME Arranged: Oxygen DME Agency: [employer] Date DME Agency Contacted: 08/22/19 Time DME Agency Contacted: [id number] Representative spoke with at DME Agency: [place] ([place]) Interventions     Readmission Risk Interventions No flowsheet data found.

## 2019-08-23 LAB — CULTURE, BLOOD (ROUTINE X 2)
Culture: NO GROWTH
Culture: NO GROWTH
Special Requests: ADEQUATE
Special Requests: ADEQUATE

## 2019-08-25 NOTE — Discharge Summary (Signed)
[place] [facility] [place] NAME: Amber Glenn    MR#:  [id number]  DATE OF BIRTH:  [birth year]  DATE OF ADMISSION:  08/18/2019   ADMITTING PHYSICIAN: Christel Mormon, MD  DATE OF DISCHARGE: 08/22/2019  4:33 PM  PRIMARY CARE PHYSICIAN: Lynnell Jude, MD   ADMISSION DIAGNOSIS:  Acute kidney injury ([place]) [N17.9] Volume depletion [E86.9] Acute respiratory failure with hypoxemia ([place]) [J96.01] Multifocal pneumonia [J18.9] Diarrhea, unspecified type [R19.7] Pneumonia due to COVID-19 virus [U07.1, J12.82] Lower respiratory tract infection due to 2019 novel coronavirus [U07.1, J22] COVID-19 [U07.1] DISCHARGE DIAGNOSIS:  Active Problems:   COVID-19   Multifocal pneumonia   Diarrhea  SECONDARY DIAGNOSIS:   Past Medical History:  Diagnosis Date  . Arthritis    hands and feet  . Cataract    right  . Diabetes mellitus    Borderline Diabetes  . GERD (gastroesophageal reflux disease)   . HTN (hypertension)   . Hyperlipidemia   . Motion sickness   . Post corneal transplant    right   HOSPITAL COURSE:  [name], hypertension, dyslipidemia and GERD, admitted for acute hypoxic respiratory failure due to Covid pneumonia.  She presented with acute onset of worseningdyspnea, drycough with associated sore throat malaise with diminished appetite and significant diarrheaas well as fatigue and tiredness. She denied any loss of taste or smell. She also had intermittent fever and chills.She has been having mild crampyabdominal painwith her diarrheaor melena or bright red blood per rectum.  Upon presentation to the emergency room, blood pressure was 85/36 with otherwise normal vital signs. Later respiratory to 26 and pulse symmetry dropped to 87% and was later up to 94 then 95% and later 99% on 2 L of O2 by nasal cannula with a blood pressure 119/51. Labs revealed positive  COVID-19 PCR and negative influenza antigens. Fibrin derivatives D-dimer came back 845.78 with lactic acid of 2. Procalcitonin was 0.1. CBC showed mild leukopenia and anemia. LDH was elevated to 185 and high-sensitivity troponin I was 18 with BNP of 18. CMP was remarkable for mild hypokalemia of 3.2 and elevated creatinine of 1.32 with AST of 79 and albumin 3.3.. Portable chest x-ray showed multifocal pneumonia likely viral or atypical etiology.  Acute hypoxemic respiratory failure: secondary to COVID-19.  Initially required 5L HFNC,  weaned off to 3 L oxygen via nasal cannula at discharge  Multifocal pneumonia: secondary to COVID-19.  Completed course of IV remdesivir, decadron, continue vitamin c & zinc at discharge. S/p ivermectin x1 on 08/19/19.  Will need 2 L oxygen via nasal cannula at discharge  Hypokalemia: Repleted and resolved  AKI: likely prerenal secondary to volume depletion and dehydration with associated anorexia and diarrhea. Resolved  DM2: continue on SSI w/ accuchecks. Hold home dose of metformin   Dyslipidemia: continue on statin   Normocytic anemia: no need for a transfusion.  Stable hemodynamics   DISCHARGE CONDITIONS:  Stable CONSULTS OBTAINED:   DRUG ALLERGIES:  No Known Allergies DISCHARGE MEDICATIONS:   Allergies as of 08/22/2019   No Known Allergies     Medication List    STOP taking these medications   meloxicam 15 MG tablet Commonly known as: MOBIC   oxyCODONE-acetaminophen 5-325 MG tablet Commonly known as: Percocet     TAKE these medications   Allergy 10 MG tablet Generic drug: loratadine Take 10 mg by mouth daily.   aspirin 81  MG tablet Take 81 mg by mouth daily.   carvedilol 6.25 MG tablet Commonly known as: COREG TAKE 1 TABLET TWICE DAILY WITH MEALS   docusate sodium 100 MG capsule Commonly known as: COLACE Take 100 mg by mouth daily.   furosemide 40 MG tablet Commonly known as: LASIX Take 1 tablet (40 mg total) by  mouth daily.   glucose blood test strip Commonly known as: True Metrix Blood Glucose Test Check sugar once daily Dx E11.9   guaiFENesin-dextromethorphan 100-10 MG/5ML syrup Commonly known as: ROBITUSSIN DM Take 10 mLs by mouth every 4 (four) hours as needed for cough.   losartan 25 MG tablet Commonly known as: COZAAR TAKE 1 TABLET EVERY DAY   metFORMIN 500 MG tablet Commonly known as: GLUCOPHAGE TAKE 1 TABLET TWICE DAILY WITH A MEAL   multivitamin capsule Take 1 capsule by mouth daily.   omeprazole 20 MG capsule Commonly known as: PRILOSEC Take 1 capsule (20 mg total) by mouth daily.   phenol 1.4 % Liqd Commonly known as: CHLORASEPTIC Use as directed 1 spray in the mouth or throat as needed for throat irritation / pain.   prednisoLONE acetate 0.12 % ophthalmic suspension Commonly known as: PRED MILD Place 1 drop into the right eye daily.   predniSONE 10 MG (21) Tbpk tablet Commonly known as: STERAPRED UNI-PAK 21 TAB Start 60 mg p.o. daily, taper 10 mg daily until done   simvastatin 40 MG tablet Commonly known as: ZOCOR TAKE 1 TABLET EVERY DAY  AT  6:00 PM   True Metrix Air Glucose Meter Devi 1 each by Does not apply route daily. Check sugar once daily Dx E11.9   TRUEplus Lancets 28G Misc Check sugar once daily Dx E11.9   vitamin C 1000 MG tablet Take 1,000 mg by mouth daily.   zinc sulfate 220 (50 Zn) MG capsule Take 1 capsule (220 mg total) by mouth 2 (two) times daily.      DISCHARGE INSTRUCTIONS:   DIET:  Cardiac diet DISCHARGE CONDITION:  Stable ACTIVITY:  Activity as tolerated OXYGEN:  Home Oxygen: Yes.    Oxygen Delivery: 3 liters/min via Patient connected to nasal cannula oxygen DISCHARGE LOCATION:  home   If you experience worsening of your admission symptoms, develop shortness of breath, life threatening emergency, suicidal or homicidal thoughts you must seek medical attention immediately by calling 911 or calling your MD immediately  if  symptoms less severe.  You Must read complete instructions/literature along with all the possible adverse reactions/side effects for all the Medicines you take and that have been prescribed to you. Take any new Medicines after you have completely understood and accpet all the possible adverse reactions/side effects.   Please note  You were cared for by a hospitalist during your hospital stay. If you have any questions about your discharge medications or the care you received while you were in the hospital after you are discharged, you can call the unit and asked to speak with the hospitalist on call if the hospitalist that took care of you is not available. Once you are discharged, your primary care physician will handle any further medical issues. Please note that NO REFILLS for any discharge medications will be authorized once you are discharged, as it is imperative that you return to your primary care physician (or establish a relationship with a primary care physician if you do not have one) for your aftercare needs so that they can reassess your need for medications and monitor your lab values.  On the day of Discharge:  VITAL SIGNS:  Blood pressure (!) 131/53, pulse 88, temperature 98 F (36.7 C), temperature source Oral, resp. rate 20, height 4\' 11"  (1.499 m), weight 63.5 kg, SpO2 97 %. PHYSICAL EXAMINATION:  GENERAL:  69 y.o.-year-old patient lying in the bed with no acute distress.  EYES: Pupils equal, round, reactive to light and accommodation. No scleral icterus. Extraocular muscles intact.  HEENT: Head atraumatic, normocephalic. Oropharynx and nasopharynx clear.  NECK:  Supple, no jugular venous distention. No thyroid enlargement, no tenderness.  LUNGS: Normal breath sounds bilaterally, no wheezing, rales,rhonchi or crepitation. No use of accessory muscles of respiration.  CARDIOVASCULAR: S1, S2 normal. No murmurs, rubs, or gallops.  ABDOMEN: Soft, non-tender, non-distended. Bowel  sounds present. No organomegaly or mass.  EXTREMITIES: No pedal edema, cyanosis, or clubbing.  NEUROLOGIC: Cranial nerves II through XII are intact. Muscle strength 5/5 in all extremities. Sensation intact. Gait not checked.  PSYCHIATRIC: The patient is alert and oriented x 3.  SKIN: No obvious rash, lesion, or ulcer.  DATA REVIEW:   CBC Recent Labs  Lab 08/22/19 0504  WBC 10.1  HGB 8.4*  HCT 24.9*  PLT 310    Chemistries  Recent Labs  Lab 08/22/19 0504  NA 136  K 3.0*  CL 98  CO2 30  GLUCOSE 120*  BUN 24*  CREATININE 0.70  CALCIUM 8.4*  MG 2.0  AST 37  ALT 33  ALKPHOS 59  BILITOT 0.6     Outpatient follow-up Follow-up Information    Lynnell Jude, MD. Go on 08/29/2019.   Specialty: Family Medicine Why: @9 :30am Contact information: [address] [place] [address] [phone]        Ottie Glazier, MD. Go on 09/05/2019.   Specialty: Pulmonary Disease Why: @10 :30 AM  Contact information: [place] [address] [phone]            Management plans discussed with the patient, family and they are in agreement.  CODE STATUS: Prior   TOTAL TIME TAKING CARE OF THIS PATIENT: 45 minutes.    Max Sane M.D on 08/25/2019 at 2:39 PM  [facility]   CC: Primary care physician; Lynnell Jude, MD   Note: This dictation was prepared with Dragon dictation along with smaller phrase technology. Any transcriptional errors that result from this process are unintentional.

## 2019-09-17 ENCOUNTER — Other Ambulatory Visit: Payer: Self-pay | Admitting: Internal Medicine

## 2019-10-23 ENCOUNTER — Other Ambulatory Visit: Payer: Self-pay | Admitting: Family Medicine

## 2019-10-23 DIAGNOSIS — Z1231 Encounter for screening mammogram for malignant neoplasm of breast: Secondary | ICD-10-CM

## 2019-10-29 ENCOUNTER — Other Ambulatory Visit: Payer: Self-pay

## 2019-11-05 ENCOUNTER — Ambulatory Visit
Admission: RE | Admit: 2019-11-05 | Discharge: 2019-11-05 | Disposition: A | Discharge status: Home / Self Care | Payer: Medicare HMO | Source: Ambulatory Visit | Attending: Family Medicine | Admitting: Family Medicine

## 2019-11-05 DIAGNOSIS — Z1231 Encounter for screening mammogram for malignant neoplasm of breast: Secondary | ICD-10-CM | POA: Diagnosis not present

## 2020-02-20 ENCOUNTER — Other Ambulatory Visit
Admission: RE | Admit: 2020-02-20 | Discharge: 2020-02-20 | Disposition: A | Discharge status: Home / Self Care | Payer: Medicare HMO | Source: Ambulatory Visit | Attending: Gastroenterology | Admitting: Gastroenterology

## 2020-02-20 ENCOUNTER — Other Ambulatory Visit: Payer: Self-pay

## 2020-02-20 DIAGNOSIS — Z01818 Encounter for other preprocedural examination: Secondary | ICD-10-CM | POA: Diagnosis present

## 2020-02-20 DIAGNOSIS — Z20822 Contact with and (suspected) exposure to covid-19: Secondary | ICD-10-CM | POA: Insufficient documentation

## 2020-02-20 LAB — SARS CORONAVIRUS 2 (TAT 6-24 HRS): SARS Coronavirus 2: NEGATIVE

## 2020-02-22 ENCOUNTER — Encounter
Admission: RE | Disposition: A | Discharge status: Home / Self Care | Payer: Self-pay | Source: Home / Self Care | Attending: Gastroenterology

## 2020-02-22 ENCOUNTER — Encounter: Payer: Self-pay | Admitting: *Deleted

## 2020-02-22 ENCOUNTER — Ambulatory Visit: Payer: Medicare HMO | Admitting: Anesthesiology

## 2020-02-22 ENCOUNTER — Ambulatory Visit
Admission: RE | Admit: 2020-02-22 | Discharge: 2020-02-22 | Disposition: A | Discharge status: Home / Self Care | Payer: Medicare HMO | Attending: Gastroenterology | Admitting: Gastroenterology

## 2020-02-22 ENCOUNTER — Other Ambulatory Visit: Payer: Self-pay

## 2020-02-22 DIAGNOSIS — Z7982 Long term (current) use of aspirin: Secondary | ICD-10-CM | POA: Insufficient documentation

## 2020-02-22 DIAGNOSIS — D509 Iron deficiency anemia, unspecified: Secondary | ICD-10-CM | POA: Diagnosis not present

## 2020-02-22 DIAGNOSIS — Z7952 Long term (current) use of systemic steroids: Secondary | ICD-10-CM | POA: Diagnosis not present

## 2020-02-22 DIAGNOSIS — K219 Gastro-esophageal reflux disease without esophagitis: Secondary | ICD-10-CM | POA: Insufficient documentation

## 2020-02-22 DIAGNOSIS — Z7984 Long term (current) use of oral hypoglycemic drugs: Secondary | ICD-10-CM | POA: Diagnosis not present

## 2020-02-22 DIAGNOSIS — I10 Essential (primary) hypertension: Secondary | ICD-10-CM | POA: Diagnosis not present

## 2020-02-22 DIAGNOSIS — K635 Polyp of colon: Secondary | ICD-10-CM | POA: Insufficient documentation

## 2020-02-22 DIAGNOSIS — Z87891 Personal history of nicotine dependence: Secondary | ICD-10-CM | POA: Insufficient documentation

## 2020-02-22 DIAGNOSIS — Z79899 Other long term (current) drug therapy: Secondary | ICD-10-CM | POA: Diagnosis not present

## 2020-02-22 DIAGNOSIS — K5730 Diverticulosis of large intestine without perforation or abscess without bleeding: Secondary | ICD-10-CM | POA: Insufficient documentation

## 2020-02-22 DIAGNOSIS — E785 Hyperlipidemia, unspecified: Secondary | ICD-10-CM | POA: Diagnosis not present

## 2020-02-22 DIAGNOSIS — E119 Type 2 diabetes mellitus without complications: Secondary | ICD-10-CM | POA: Insufficient documentation

## 2020-02-22 HISTORY — PX: COLONOSCOPY WITH PROPOFOL: SHX5780

## 2020-02-22 HISTORY — DX: Other complications of anesthesia, initial encounter: T88.59XA

## 2020-02-22 HISTORY — PX: ESOPHAGOGASTRODUODENOSCOPY (EGD) WITH PROPOFOL: SHX5813

## 2020-02-22 LAB — GLUCOSE, CAPILLARY: Glucose-Capillary: 130 mg/dL — ABNORMAL HIGH (ref 70–99)

## 2020-02-22 SURGERY — ESOPHAGOGASTRODUODENOSCOPY (EGD) WITH PROPOFOL
Anesthesia: General

## 2020-02-22 MED ORDER — PROPOFOL 10 MG/ML IV BOLUS
INTRAVENOUS | Status: DC | PRN
  Administered 2020-02-22: 50 mg via INTRAVENOUS

## 2020-02-22 MED ORDER — PHENYLEPHRINE HCL (PRESSORS) 10 MG/ML IV SOLN
INTRAVENOUS | Status: AC
  Filled 2020-02-22: qty 1

## 2020-02-22 MED ORDER — EPHEDRINE 5 MG/ML INJ
INTRAVENOUS | Status: AC
  Filled 2020-02-22: qty 10

## 2020-02-22 MED ORDER — ESMOLOL HCL 100 MG/10ML IV SOLN
INTRAVENOUS | Status: AC
  Filled 2020-02-22: qty 10

## 2020-02-22 MED ORDER — GLYCOPYRROLATE 0.2 MG/ML IJ SOLN
INTRAMUSCULAR | Status: DC | PRN
  Administered 2020-02-22: .2 mg via INTRAVENOUS

## 2020-02-22 MED ORDER — SODIUM CHLORIDE 0.9 % IV SOLN: INTRAVENOUS | Status: DC

## 2020-02-22 MED ORDER — PROPOFOL 500 MG/50ML IV EMUL
INTRAVENOUS | Status: AC
  Filled 2020-02-22: qty 500

## 2020-02-22 MED ORDER — LIDOCAINE HCL (PF) 2 % IJ SOLN
INTRAMUSCULAR | Status: AC
  Filled 2020-02-22: qty 40

## 2020-02-22 MED ORDER — PROPOFOL 500 MG/50ML IV EMUL
INTRAVENOUS | Status: AC
  Filled 2020-02-22: qty 50

## 2020-02-22 MED ORDER — LIDOCAINE HCL (CARDIAC) PF 100 MG/5ML IV SOSY
PREFILLED_SYRINGE | INTRAVENOUS | Status: DC | PRN
  Administered 2020-02-22: 100 mg via INTRAVENOUS

## 2020-02-22 MED ORDER — PROPOFOL 500 MG/50ML IV EMUL
INTRAVENOUS | Status: DC | PRN
  Administered 2020-02-22: 100 ug/kg/min via INTRAVENOUS

## 2020-02-22 MED ORDER — GLYCOPYRROLATE 0.2 MG/ML IJ SOLN
INTRAMUSCULAR | Status: AC
  Filled 2020-02-22: qty 6

## 2020-02-22 NOTE — H&P (Signed)
Outpatient short stay form Pre-procedure 02/22/2020 7:56 AM Amber Miyamoto MD, MPH  Primary Physician: Dr. Clemmie Krill  Reason for visit:  IDA  History of present illness:   69 y/o lady with history of long covid and IDA here for EGD/Colonoscopy for evaluation. No overt GI symptoms such as GI bleeding. No hysterectomy. No blood thinners. No family history of GI malignancies.    Current Facility-Administered Medications:  .  0.9 %  sodium chloride infusion, , Intravenous, Continuous, Alek Poncedeleon, Hilton Cork, MD, Last Rate: 20 mL/hr at 02/22/20 0751, Continued from Pre-op at 02/22/20 0751  Medications Prior to Admission  Medication Sig Dispense Refill Last Dose  . carvedilol (COREG) 6.25 MG tablet TAKE 1 TABLET TWICE DAILY WITH MEALS 180 tablet 3 02/22/2020 at 0500  . furosemide (LASIX) 40 MG tablet Take 1 tablet (40 mg total) by mouth daily. 90 tablet 3 02/21/2020 at Unknown time  . losartan (COZAAR) 25 MG tablet TAKE 1 TABLET EVERY DAY 90 tablet 3 02/21/2020 at Unknown time  . omeprazole (PRILOSEC) 20 MG capsule Take 1 capsule (20 mg total) by mouth daily. 90 capsule 3 02/21/2020 at Unknown time  . prednisoLONE acetate (PRED MILD) 0.12 % ophthalmic suspension Place 1 drop into the right eye daily.   02/22/2020 at Unknown time  . Ascorbic Acid (VITAMIN C) 1000 MG tablet Take 1,000 mg by mouth daily.     02/20/20  . aspirin 81 MG tablet Take 81 mg by mouth daily.   (Patient not taking: Reported on 02/22/2020)   Not Taking at Unknown time  . Blood Glucose Monitoring Suppl (TRUE METRIX AIR GLUCOSE METER) DEVI 1 each by Does not apply route daily. Check sugar once daily Dx E11.9 1 Device 0   . docusate sodium (COLACE) 100 MG capsule Take 100 mg by mouth daily.    02/20/20  . glucose blood (TRUE METRIX BLOOD GLUCOSE TEST) test strip Check sugar once daily Dx E11.9 100 each 3   . guaiFENesin-dextromethorphan (ROBITUSSIN DM) 100-10 MG/5ML syrup Take 10 mLs by mouth every 4 (four) hours as needed for  cough. 118 mL 0   . loratadine (ALLERGY) 10 MG tablet Take 10 mg by mouth daily.   02/20/20  . metFORMIN (GLUCOPHAGE) 500 MG tablet TAKE 1 TABLET TWICE DAILY WITH A MEAL 180 tablet 1 02/20/20  . Multiple Vitamin (MULTIVITAMIN) capsule Take 1 capsule by mouth daily.     02/20/20  . phenol (CHLORASEPTIC) 1.4 % LIQD Use as directed 1 spray in the mouth or throat as needed for throat irritation / pain. 236 mL 0   . predniSONE (STERAPRED UNI-PAK 21 TAB) 10 MG (21) TBPK tablet Start 60 mg p.o. daily, taper 10 mg daily until done (Patient not taking: Reported on 02/22/2020) 21 tablet 0 Completed Course at Unknown time  . simvastatin (ZOCOR) 40 MG tablet TAKE 1 TABLET EVERY DAY  AT  6:00 PM 90 tablet 3 02/20/20  . TRUEPLUS LANCETS 28G MISC Check sugar once daily Dx E11.9 100 each 3   . zinc sulfate 220 (50 Zn) MG capsule Take 1 capsule (220 mg total) by mouth 2 (two) times daily. (Patient not taking: Reported on 02/22/2020) 60 capsule 0 Not Taking at Unknown time     No Known Allergies   Past Medical History:  Diagnosis Date  . Arthritis    hands and feet  . Cataract    right  . Complication of anesthesia    sometimes have a hard time waking up  . Diabetes  mellitus    Borderline Diabetes  . GERD (gastroesophageal reflux disease)   . HTN (hypertension)   . Hyperlipidemia   . Motion sickness   . Post corneal transplant    right    Review of systems:  Otherwise negative.    Physical Exam  Gen: Alert, oriented. Appears stated age.  HEENT: PERRLA. Lungs: No respiratory distress CV: RRR Abd: soft, benign, no masses. Ext: No edema.     Planned procedures: Proceed with colonoscopy. The patient understands the nature of the planned procedure, indications, risks, alternatives and potential complications including but not limited to bleeding, infection, perforation, damage to internal organs and possible oversedation/side effects from anesthesia. The patient agrees and gives consent to  proceed.  Please refer to procedure notes for findings, recommendations and patient disposition/instructions.     Amber Miyamoto MD, MPH Gastroenterology 02/22/2020  7:56 AM

## 2020-02-22 NOTE — Anesthesia Procedure Notes (Signed)
Procedure Name: General with mask airway Performed by: Fletcher-Harrison, Khalid Lacko, CRNA Pre-anesthesia Checklist: Patient identified, Emergency Drugs available, Suction available and Patient being monitored Patient Re-evaluated:Patient Re-evaluated prior to induction Oxygen Delivery Method: Simple face mask Induction Type: IV induction Placement Confirmation: positive ETCO2 and CO2 detector Dental Injury: Teeth and Oropharynx as per pre-operative assessment        

## 2020-02-22 NOTE — Op Note (Signed)
[facility] Gastroenterology Patient Name: Amber Glenn Procedure Date: 02/22/2020 7:50 AM MRN: [id number] Account #: [id number] Date of Birth: 1951/02/07 Admit Type: Outpatient Age: 69 Room: [facility] ENDO ROOM 3 Gender: Female Note Status: Finalized Procedure:             Upper GI endoscopy Indications:           Iron deficiency anemia Providers:             Andrey Farmer MD, MD Referring MD:          Lynnell Jude (Referring MD) Medicines:             Monitored Anesthesia Care Complications:         No immediate complications. Procedure:             Pre-Anesthesia Assessment:                        - Prior to the procedure, a History and Physical was                         performed, and patient medications and allergies were                         reviewed. The patient is competent. The risks and                         benefits of the procedure and the sedation options and                         risks were discussed with the patient. All questions                         were answered and informed consent was obtained.                         Patient identification and proposed procedure were                         verified by the physician, the nurse, the anesthetist                         and the technician in the endoscopy suite. Mental                         Status Examination: alert and oriented. Airway                         Examination: normal oropharyngeal airway and neck                         mobility. Respiratory Examination: clear to                         auscultation. CV Examination: normal. Prophylactic                         Antibiotics: The patient does not require prophylactic  antibiotics. Prior Anticoagulants: The patient has                         taken no previous anticoagulant or antiplatelet                         agents. ASA Grade Assessment: II - A patient with mild                         systemic  disease. After reviewing the risks and                         benefits, the patient was deemed in satisfactory                         condition to undergo the procedure. The anesthesia                         plan was to use monitored anesthesia care (MAC).                         Immediately prior to administration of medications,                         the patient was re-assessed for adequacy to receive                         sedatives. The heart rate, respiratory rate, oxygen                         saturations, blood pressure, adequacy of pulmonary                         ventilation, and response to care were monitored                         throughout the procedure. The physical status of the                         patient was re-assessed after the procedure.                        After obtaining informed consent, the endoscope was                         passed under direct vision. Throughout the procedure,                         the patient's blood pressure, pulse, and oxygen                         saturations were monitored continuously. The Endoscope                         was introduced through the mouth, and advanced to the                         second part of duodenum. The upper GI endoscopy was  accomplished without difficulty. The patient tolerated                         the procedure well. Findings:      The examined esophagus was normal.      The entire examined stomach was normal.      The examined duodenum was normal. Impression:            - Normal esophagus.                        - Normal stomach.                        - Normal examined duodenum.                        - No specimens collected. Recommendation:        - Discharge patient to home.                        - Resume previous diet.                        - Continue present medications.                        - Return to referring physician as previously                          scheduled. Procedure Code(s):     --- Professional ---                        [phone], Esophagogastroduodenoscopy, flexible,                         transoral; diagnostic, including collection of                         specimen(s) by brushing or washing, when performed                         (separate procedure) Diagnosis Code(s):     --- Professional ---                        D50.9, Iron deficiency anemia, unspecified CPT copyright 2019 American Medical Association. All rights reserved. The codes documented in this report are preliminary and upon coder review may  be revised to meet current compliance requirements. Andrey Farmer, MD Andrey Farmer MD, MD 02/22/2020 8:32:25 AM Number of Addenda: 0 Note Initiated On: 02/22/2020 7:50 AM Estimated Blood Loss:  Estimated blood loss: none.      [facility]

## 2020-02-22 NOTE — Op Note (Signed)
[facility] Gastroenterology Patient Name: Amber Glenn Procedure Date: 02/22/2020 7:49 AM MRN: [id number] Account #: [id number] Date of Birth: April 10, 1951 Admit Type: Outpatient Age: 69 Room: [facility] ENDO ROOM 3 Gender: Female Note Status: Finalized Procedure:             Colonoscopy Indications:           Iron deficiency anemia Providers:             Andrey Farmer MD, MD Referring MD:          Lynnell Jude (Referring MD) Medicines:             Monitored Anesthesia Care Complications:         No immediate complications. Estimated blood loss:                         Minimal. Procedure:             Pre-Anesthesia Assessment:                        - Prior to the procedure, a History and Physical was                         performed, and patient medications and allergies were                         reviewed. The patient is competent. The risks and                         benefits of the procedure and the sedation options and                         risks were discussed with the patient. All questions                         were answered and informed consent was obtained.                         Patient identification and proposed procedure were                         verified by the physician, the nurse, the anesthetist                         and the technician in the endoscopy suite. Mental                         Status Examination: alert and oriented. Airway                         Examination: normal oropharyngeal airway and neck                         mobility. Respiratory Examination: clear to                         auscultation. CV Examination: normal. Prophylactic                         Antibiotics: The  patient does not require prophylactic                         antibiotics. Prior Anticoagulants: The patient has                         taken no previous anticoagulant or antiplatelet                         agents. ASA Grade Assessment: II - A  patient with mild                         systemic disease. After reviewing the risks and                         benefits, the patient was deemed in satisfactory                         condition to undergo the procedure. The anesthesia                         plan was to use monitored anesthesia care (MAC).                         Immediately prior to administration of medications,                         the patient was re-assessed for adequacy to receive                         sedatives. The heart rate, respiratory rate, oxygen                         saturations, blood pressure, adequacy of pulmonary                         ventilation, and response to care were monitored                         throughout the procedure. The physical status of the                         patient was re-assessed after the procedure.                        After obtaining informed consent, the colonoscope was                         passed under direct vision. Throughout the procedure,                         the patient's blood pressure, pulse, and oxygen                         saturations were monitored continuously. The                         Colonoscope was introduced through the anus and  advanced to the the cecum, identified by appendiceal                         orifice and ileocecal valve. The colonoscopy was                         performed without difficulty. The patient tolerated                         the procedure well. The quality of the bowel                         preparation was good. Findings:      The perianal and digital rectal examinations were normal.      A few small-mouthed diverticula were found in the sigmoid colon,       descending colon and ascending colon.      A 2 mm polyp was found in the sigmoid colon. The polyp was sessile. The       polyp was removed with a jumbo cold forceps. Resection and retrieval       were complete. Estimated blood  loss was minimal.      The exam was otherwise without abnormality on direct and retroflexion       views. Impression:            - Diverticulosis in the sigmoid colon, in the                         descending colon and in the ascending colon.                        - One 2 mm polyp in the sigmoid colon, removed with a                         jumbo cold forceps. Resected and retrieved.                        - The examination was otherwise normal on direct and                         retroflexion views. Due to computer issues,                         retroflexed view did not capture. Recommendation:        - Discharge patient to home.                        - Resume previous diet.                        - Continue present medications.                        - Await pathology results.                        - Repeat colonoscopy for surveillance based on                         pathology results.                        -  Return to referring physician as previously                         scheduled. Procedure Code(s):     --- Professional ---                        [phone], Colonoscopy, flexible; with biopsy, single or                         multiple Diagnosis Code(s):     --- Professional ---                        K63.5, Polyp of colon                        D50.9, Iron deficiency anemia, unspecified                        K57.30, Diverticulosis of large intestine without                         perforation or abscess without bleeding CPT copyright 2019 American Medical Association. All rights reserved. The codes documented in this report are preliminary and upon coder review may  be revised to meet current compliance requirements. Andrey Farmer, MD Andrey Farmer MD, MD 02/22/2020 8:36:40 AM Number of Addenda: 0 Note Initiated On: 02/22/2020 7:49 AM Scope Withdrawal Time: 0 hours 10 minutes 57 seconds  Total Procedure Duration: 0 hours 15 minutes 47 seconds  Estimated Blood  Loss:  Estimated blood loss was minimal.      [facility]

## 2020-02-22 NOTE — Anesthesia Postprocedure Evaluation (Signed)
Anesthesia Post Note  Patient: Amber Glenn  Procedure(s) Performed: ESOPHAGOGASTRODUODENOSCOPY (EGD) WITH PROPOFOL (N/A ) COLONOSCOPY WITH PROPOFOL (N/A )  Patient location during evaluation: Endoscopy Anesthesia Type: General Level of consciousness: awake and alert and oriented Pain management: pain level controlled Vital Signs Assessment: post-procedure vital signs reviewed and stable Respiratory status: spontaneous breathing, nonlabored ventilation and respiratory function stable Cardiovascular status: blood pressure returned to baseline and stable Postop Assessment: no signs of nausea or vomiting Anesthetic complications: no   No complications documented.   Last Vitals:  Vitals:   02/22/20 0737 02/22/20 0829  BP: 139/75 117/60  Pulse: 72   Resp: 18 13  Temp: (!) 36.3 C (!) 36.2 C  SpO2: 100%     Last Pain:  Vitals:   02/22/20 0849  TempSrc:   PainSc: 0-No pain                 Avereigh Spainhower

## 2020-02-22 NOTE — Transfer of Care (Signed)
1Immediate Anesthesia Transfer of Care Note  Patient: TAKEYLA MILLION  Procedure(s) Performed: ESOPHAGOGASTRODUODENOSCOPY (EGD) WITH PROPOFOL (N/A ) COLONOSCOPY WITH PROPOFOL (N/A )  Patient Location: Endoscopy Unit  Anesthesia Type:General  Level of Consciousness: drowsy and patient cooperative  Airway & Oxygen Therapy: Patient Spontanous Breathing and Patient connected to face mask oxygen  Post-op Assessment: Report given to RN and Post -op Vital signs reviewed and stable  Post vital signs: Reviewed and stable  Last Vitals:  Vitals Value Taken Time  BP 117/60 02/22/20 0829  Temp 36.2 C 02/22/20 0829  Pulse 80 02/22/20 0834  Resp 16 02/22/20 0834  SpO2 98 % 02/22/20 0834  Vitals shown include unvalidated device data.  Last Pain:  Vitals:   02/22/20 0829  TempSrc: Temporal  PainSc: Asleep         Complications: No complications documented.

## 2020-02-22 NOTE — Anesthesia Preprocedure Evaluation (Signed)
Anesthesia Evaluation  Patient identified by MRN, date of birth, ID band Patient awake    Reviewed: Allergy & Precautions, NPO status , Patient's Chart, lab work & pertinent test results  History of Anesthesia Complications Negative for: history of anesthetic complications  Airway Mallampati: II  TM Distance: >3 FB Neck ROM: Full    Dental no notable dental hx.    Pulmonary neg sleep apnea, neg COPD, former smoker,    breath sounds clear to auscultation- rhonchi (-) wheezing      Cardiovascular hypertension, Pt. on medications (-) CAD, (-) Past MI, (-) Cardiac Stents and (-) CABG  Rhythm:Regular Rate:Normal - Systolic murmurs and - Diastolic murmurs    Neuro/Psych neg Seizures negative neurological ROS  negative psych ROS   GI/Hepatic Neg liver ROS, GERD  ,  Endo/Other  diabetes, Oral Hypoglycemic Agents  Renal/GU negative Renal ROS     Musculoskeletal  (+) Arthritis ,   Abdominal (+) - obese,   Peds  Hematology negative hematology ROS (+)   Anesthesia Other Findings Past Medical History: No date: Arthritis     Comment:  hands and feet No date: Cataract     Comment:  right No date: Complication of anesthesia     Comment:  sometimes have a hard time waking up No date: Diabetes mellitus     Comment:  Borderline Diabetes No date: GERD (gastroesophageal reflux disease) No date: HTN (hypertension) No date: Hyperlipidemia No date: Motion sickness 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   Reproductive/Obstetrics                             Anesthesia Physical Anesthesia Plan  ASA: III  Anesthesia Plan: General   Post-op Pain Management:    Induction: Intravenous  PONV Risk Score and Plan: 2 and Propofol infusion  Airway Management Planned: Natural Airway  Additional Equipment:   Intra-op Plan:   Post-operative Plan:   Informed Consent: I have reviewed the  patients History and Physical, chart, labs and discussed the procedure including the risks, benefits and alternatives for the proposed anesthesia with the patient or authorized representative who has indicated his/her understanding and acceptance.     Dental advisory given  Plan Discussed with: CRNA and Anesthesiologist  Anesthesia Plan Comments:         Anesthesia Quick Evaluation

## 2020-02-22 NOTE — Interval H&P Note (Signed)
History and Physical Interval Note:  02/22/2020 7:59 AM  Amber Glenn  has presented today for surgery, with the diagnosis of Anemia.  The various methods of treatment have been discussed with the patient and family. After consideration of risks, benefits and other options for treatment, the patient has consented to  Procedure(s): ESOPHAGOGASTRODUODENOSCOPY (EGD) WITH PROPOFOL (N/A) COLONOSCOPY WITH PROPOFOL (N/A) as a surgical intervention.  The patient's history has been reviewed, patient examined, no change in status, stable for surgery.  I have reviewed the patient's chart and labs.  Questions were answered to the patient's satisfaction.     Lesly Rubenstein  Ok to proceed with EGD/Colonoscopy

## 2020-02-25 ENCOUNTER — Encounter: Payer: Self-pay | Admitting: Gastroenterology

## 2020-02-25 LAB — SURGICAL PATHOLOGY

## 2021-01-11 ENCOUNTER — Emergency Department
Admission: EM | Admit: 2021-01-11 | Discharge: 2021-01-11 | Disposition: A | Discharge status: Home / Self Care | Payer: Medicare HMO | Attending: Emergency Medicine | Admitting: Emergency Medicine

## 2021-01-11 ENCOUNTER — Other Ambulatory Visit: Payer: Self-pay

## 2021-01-11 ENCOUNTER — Emergency Department: Payer: Medicare HMO

## 2021-01-11 ENCOUNTER — Encounter: Payer: Self-pay | Admitting: Emergency Medicine

## 2021-01-11 DIAGNOSIS — Z7982 Long term (current) use of aspirin: Secondary | ICD-10-CM | POA: Diagnosis not present

## 2021-01-11 DIAGNOSIS — E119 Type 2 diabetes mellitus without complications: Secondary | ICD-10-CM | POA: Diagnosis not present

## 2021-01-11 DIAGNOSIS — R42 Dizziness and giddiness: Secondary | ICD-10-CM | POA: Insufficient documentation

## 2021-01-11 DIAGNOSIS — Z87891 Personal history of nicotine dependence: Secondary | ICD-10-CM | POA: Insufficient documentation

## 2021-01-11 DIAGNOSIS — R4781 Slurred speech: Secondary | ICD-10-CM | POA: Diagnosis not present

## 2021-01-11 DIAGNOSIS — Z85828 Personal history of other malignant neoplasm of skin: Secondary | ICD-10-CM | POA: Insufficient documentation

## 2021-01-11 DIAGNOSIS — Z8616 Personal history of COVID-19: Secondary | ICD-10-CM | POA: Insufficient documentation

## 2021-01-11 DIAGNOSIS — I10 Essential (primary) hypertension: Secondary | ICD-10-CM | POA: Insufficient documentation

## 2021-01-11 DIAGNOSIS — Z7984 Long term (current) use of oral hypoglycemic drugs: Secondary | ICD-10-CM | POA: Insufficient documentation

## 2021-01-11 DIAGNOSIS — M79646 Pain in unspecified finger(s): Secondary | ICD-10-CM | POA: Diagnosis not present

## 2021-01-11 DIAGNOSIS — R110 Nausea: Secondary | ICD-10-CM | POA: Insufficient documentation

## 2021-01-11 DIAGNOSIS — Z79899 Other long term (current) drug therapy: Secondary | ICD-10-CM | POA: Diagnosis not present

## 2021-01-11 LAB — DIFFERENTIAL
Abs Immature Granulocytes: 0.03 10*3/uL (ref 0.00–0.07)
Basophils Absolute: 0.1 10*3/uL (ref 0.0–0.1)
Basophils Relative: 1 %
Eosinophils Absolute: 0.4 10*3/uL (ref 0.0–0.5)
Eosinophils Relative: 5 %
Immature Granulocytes: 0 %
Lymphocytes Relative: 31 %
Lymphs Abs: 2.5 10*3/uL (ref 0.7–4.0)
Monocytes Absolute: 0.6 10*3/uL (ref 0.1–1.0)
Monocytes Relative: 8 %
Neutro Abs: 4.3 10*3/uL (ref 1.7–7.7)
Neutrophils Relative %: 55 %

## 2021-01-11 LAB — COMPREHENSIVE METABOLIC PANEL
ALT: 18 U/L (ref 0–44)
AST: 27 U/L (ref 15–41)
Albumin: 4.1 g/dL (ref 3.5–5.0)
Alkaline Phosphatase: 68 U/L (ref 38–126)
Anion gap: 7 (ref 5–15)
BUN: 13 mg/dL (ref 8–23)
CO2: 28 mmol/L (ref 22–32)
Calcium: 9.5 mg/dL (ref 8.9–10.3)
Chloride: 103 mmol/L (ref 98–111)
Creatinine, Ser: 0.75 mg/dL (ref 0.44–1.00)
GFR, Estimated: >60 mL/min (ref >60)
Glucose, Bld: 124 mg/dL — ABNORMAL HIGH (ref 70–99)
Potassium: 4 mmol/L (ref 3.5–5.1)
Sodium: 138 mmol/L (ref 135–145)
Total Bilirubin: 0.5 mg/dL (ref 0.3–1.2)
Total Protein: 7.4 g/dL (ref 6.5–8.1)

## 2021-01-11 LAB — CBC
HCT: 36.2 % (ref 36.0–46.0)
Hemoglobin: 12 g/dL (ref 12.0–15.0)
MCH: 30.1 pg (ref 26.0–34.0)
MCHC: 33.1 g/dL (ref 30.0–36.0)
MCV: 90.7 fL (ref 80.0–100.0)
Platelets: 268 10*3/uL (ref 150–400)
RBC: 3.99 MIL/uL (ref 3.87–5.11)
RDW: 13.1 % (ref 11.5–15.5)
WBC: 7.8 10*3/uL (ref 4.0–10.5)
nRBC: 0 % (ref 0.0–0.2)

## 2021-01-11 LAB — PROTIME-INR
INR: 0.9 (ref 0.8–1.2)
Prothrombin Time: 12.3 seconds (ref 11.4–15.2)

## 2021-01-11 LAB — APTT: aPTT: 33 seconds (ref 24–36)

## 2021-01-11 LAB — CBG MONITORING, ED: Glucose-Capillary: 115 mg/dL — ABNORMAL HIGH (ref 70–99)

## 2021-01-11 MED ORDER — MECLIZINE HCL 25 MG PO TABS
25.0000 mg | ORAL_TABLET | Freq: Once | ORAL | Status: AC
  Administered 2021-01-11: 25 mg via ORAL
  Filled 2021-01-11: qty 1

## 2021-01-11 MED ORDER — SODIUM CHLORIDE 0.9% FLUSH: 3.0000 mL | Freq: Once | INTRAVENOUS | Status: DC

## 2021-01-11 MED ORDER — MECLIZINE HCL 25 MG PO TABS: 25.0000 mg | ORAL_TABLET | Freq: Three times a day (TID) | ORAL | 0 refills | Status: DC | PRN

## 2021-01-11 NOTE — ED Notes (Signed)
Patient currently in MRI at this time

## 2021-01-11 NOTE — ED Notes (Signed)
CODE STROKE called to  [facility])

## 2021-01-11 NOTE — ED Notes (Signed)
Patient reports symptoms have resolved at this time.  No c/o dizziness or weakness

## 2021-01-11 NOTE — ED Notes (Signed)
LKW 1920  CT at 2036  TELE-NURO pushed at 2038  CT to [place] 2040

## 2021-01-11 NOTE — ED Provider Notes (Signed)
[facility] Emergency Department Provider Note   ____________________________________________   I have reviewed the triage vital signs and the nursing notes.   HISTORY  Chief Complaint Altered Mental Status and Dizziness   History limited by: Not Limited   HPI Amber Glenn is a 70 y.o. female who presents to the emergency department today because of concern for dizziness.  The patient states that she first noticed this around 7:00 this evening when she went to stand up to go to the bathroom.  She became very lightheaded and dizzy.  She then sat down and it did seem to get better.  However since then she states anytime she tries to stand up or moves her head suddenly she becomes dizzy.  She did not appreciate any weakness in the arms or legs.  She did call her daughter who thought she was having some slurred speech.  Patient states she has felt somewhat similar symptoms in the past when she was dehydrated with COVID.  Denies any recent fevers.   Records reviewed. Per medical record review patient has a history of GERD, HTN.  Past Medical History:  Diagnosis Date   Arthritis    hands and feet   Cataract    right   Complication of anesthesia    sometimes have a hard time waking up   Diabetes mellitus    Borderline Diabetes   GERD (gastroesophageal reflux disease)    HTN (hypertension)    Hyperlipidemia    Motion sickness    Post corneal transplant    right    Patient Active Problem List   Diagnosis Date Noted   Multifocal pneumonia    Diarrhea    COVID-19 08/18/2019   Thumb pain 06/26/2015   Medicare annual wellness visit, subsequent 03/14/2015   Arthralgia 09/02/2014   Hot flashes 01/21/2014   Left shoulder pain 09/20/2013   Unspecified constipation 08/30/2012   Routine general medical examination at a health care facility 05/29/2012   Screening for colon cancer 05/29/2012   Hypertension 08/18/2011   Osteoarthritis 03/20/2009   Diabetes  mellitus type 2, controlled ([place]) 11/14/2008   BASAL CELL CARCINOMA, FACE 12/06/2006   Hyperlipidemia 12/06/2006   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 Manley Mason   CARPAL TUNNEL RELEASE     right   COLONOSCOPY WITH PROPOFOL N/A 12/03/2015   Procedure: COLONOSCOPY WITH PROPOFOL;  Surgeon: Manya Silvas, MD;  Location: [place];  Service: Endoscopy;  Laterality: N/A;   COLONOSCOPY WITH PROPOFOL N/A 02/22/2020   Procedure: COLONOSCOPY WITH PROPOFOL;  Surgeon: Lesly Rubenstein, MD;  Location: [facility];  Service: Endoscopy;  Laterality: N/A;   ESOPHAGOGASTRODUODENOSCOPY (EGD) WITH PROPOFOL N/A 02/22/2020   Procedure: ESOPHAGOGASTRODUODENOSCOPY (EGD) WITH PROPOFOL;  Surgeon: Lesly Rubenstein, MD;  Location: [facility];  Service: Endoscopy;  Laterality: N/A;   HAMMER TOE SURGERY Right 05/23/2019   Procedure: HAMMER TOE CORRECTION;  Surgeon: Samara Deist, DPM;  Location: [place];  Service: Podiatry;  Laterality: Right;  Diabetic   OSTECTOMY Right 05/23/2019   Procedure: DOUBLE OSTEOTOMY RIGHT;  Surgeon: Samara Deist, DPM;  Location: [place];  Service: Podiatry;  Laterality: Right;   SKIN CANCER EXCISION      Prior to Admission medications   Medication Sig Start Date End Date Taking?  Authorizing Provider  Ascorbic Acid (VITAMIN C) 1000 MG tablet Take 1,000 mg by mouth daily.      [provider]  aspirin 81 MG tablet Take 81 mg by mouth daily.   Patient not taking: Reported on 02/22/2020    [provider]  Blood Glucose Monitoring Suppl (TRUE METRIX AIR GLUCOSE METER) DEVI 1 each by Does not apply route daily. Check sugar once daily Dx E11.9 06/17/14   Jackolyn Confer, MD  carvedilol (COREG) 6.25 MG tablet TAKE 1 TABLET TWICE  DAILY WITH MEALS 10/07/15   Jackolyn Confer, MD  docusate sodium (COLACE) 100 MG capsule Take 100 mg by mouth daily.     [provider]  furosemide (LASIX) 40 MG tablet Take 1 tablet (40 mg total) by mouth daily. 12/20/14   Jackolyn Confer, MD  glucose blood (TRUE METRIX BLOOD GLUCOSE TEST) test strip Check sugar once daily Dx E11.9 06/17/14   Jackolyn Confer, MD  guaiFENesin-dextromethorphan (ROBITUSSIN DM) 100-10 MG/5ML syrup Take 10 mLs by mouth every 4 (four) hours as needed for cough. 08/22/19   Max Sane, MD  loratadine (ALLERGY) 10 MG tablet Take 10 mg by mouth daily.    [provider]  losartan (COZAAR) 25 MG tablet TAKE 1 TABLET EVERY DAY 03/12/15   Jackolyn Confer, MD  metFORMIN (GLUCOPHAGE) 500 MG tablet TAKE 1 TABLET TWICE DAILY WITH A MEAL 05/27/15   Jackolyn Confer, MD  Multiple Vitamin (MULTIVITAMIN) capsule Take 1 capsule by mouth daily.      [provider]  omeprazole (PRILOSEC) 20 MG capsule Take 1 capsule (20 mg total) by mouth daily. 12/20/14   Jackolyn Confer, MD  phenol (CHLORASEPTIC) 1.4 % LIQD Use as directed 1 spray in the mouth or throat as needed for throat irritation / pain. 08/22/19   Max Sane, MD  prednisoLONE acetate (PRED MILD) 0.12 % ophthalmic suspension Place 1 drop into the right eye daily.    [provider]  predniSONE (STERAPRED UNI-PAK 21 TAB) 10 MG (21) TBPK tablet Start 60 mg p.o. daily, taper 10 mg daily until done Patient not taking: Reported on 02/22/2020 08/22/19   Max Sane, MD  simvastatin (ZOCOR) 40 MG tablet TAKE 1 TABLET EVERY DAY  AT  6:00 PM 12/20/14   Jackolyn Confer, MD  TRUEPLUS LANCETS 28G MISC Check sugar once daily Dx E11.9 06/17/14   Jackolyn Confer, MD  zinc sulfate 220 (50 Zn) MG capsule Take 1 capsule (220 mg total) by mouth 2 (two) times daily. Patient not taking: Reported on 02/22/2020 08/22/19   Max Sane, MD    Allergies Patient has no known allergies.  Family History   Problem Relation Age of Onset   Heart disease Mother    Heart disease Father    Heart disease Maternal Grandmother    Heart disease Maternal Grandfather    Breast cancer Sister 87    Social History Social History   Tobacco Use   Smoking status: Former    Types: Cigarettes    Quit date: 01/13/2009    Years since quitting: 12.0   Smokeless tobacco: Never  Substance Use Topics   Alcohol use: No   Drug use: No    Review of Systems Constitutional: No fever/chills Eyes: No visual changes. ENT: No sore throat. Cardiovascular: Denies chest pain. Respiratory: Denies shortness of breath. Gastrointestinal: No abdominal pain.  No nausea, no vomiting.  No diarrhea.   Genitourinary: Negative for dysuria. Musculoskeletal: Negative  for back pain. Skin: Negative for rash. Neurological: Positive for dizziness.  ____________________________________________   PHYSICAL EXAM:  VITAL SIGNS: ED Triage Vitals  Enc Vitals Group     BP 01/11/21 2013 (!) 178/69     Pulse Rate 01/11/21 2013 98     Resp 01/11/21 2013 20     Temp 01/11/21 2013 97.8 F (36.6 C)     Temp Source 01/11/21 2013 Oral     SpO2 01/11/21 2013 99 %     Weight 01/11/21 2014 141 lb (64 kg)     Height 01/11/21 2014 5\' 1"  (1.549 m)     Head Circumference --      Peak Flow --      Pain Score 01/11/21 2014 0   Constitutional: Alert and oriented.  Eyes: Conjunctivae are normal.  ENT      Head: Normocephalic and atraumatic.      Nose: No congestion/rhinnorhea.      Mouth/Throat: Mucous membranes are moist.      Neck: No stridor. Hematological/Lymphatic/Immunilogical: No cervical lymphadenopathy. Cardiovascular: Normal rate, regular rhythm.  No murmurs, rubs, or gallops.  Respiratory: Normal respiratory effort without tachypnea nor retractions. Breath sounds are clear and equal bilaterally. No wheezes/rales/rhonchi. Gastrointestinal: Soft and non tender. No rebound. No guarding.  Genitourinary:  Deferred Musculoskeletal: Normal range of motion in all extremities. No lower extremity edema. Neurologic:  Normal speech and language. Face symmetric. EOMI. PERRL. Strength 5/5 in all extremities. Sensation grossly intact. No gross focal neurologic deficits are appreciated.  Skin:  Skin is warm, dry and intact. No rash noted. Psychiatric: Mood and affect are normal. Speech and behavior are normal. Patient exhibits appropriate insight and judgment.  ____________________________________________    LABS (pertinent positives/negatives)  CMP wnl except glu 124 CBC wbc 7.8, hgb 12.0, plt 268  ____________________________________________   EKG  I, Nance Pear, attending physician, personally viewed and interpreted this EKG  EKG Time: 2014 Rate: 95 Rhythm: sinus rhythm with premature supraventricular beat Axis: normal Intervals: qtc 444 QRS: Incomplete RBBB ST changes: no st elevation Impression: abnormal ekg   ____________________________________________    RADIOLOGY  CT head No acute abnormality  MR brain No acute abnromality ____________________________________________   PROCEDURES  Procedures  ____________________________________________   INITIAL IMPRESSION / ASSESSMENT AND PLAN / ED COURSE  Pertinent labs & imaging results that were available during my care of the patient were reviewed by me and considered in my medical decision making (see chart for details).   Patient presented to the emergency department today because of concerns for dizziness and nausea.  It does seem to be quite positional.  Patient was called a code stroke and teleneurology did evaluate.  They do think it is likely vertigo.  They did recommend getting an MRI to make sure there is no acute intracranial process.  MRI was obtained and did not show any concerning findings.  At this point will plan on treating for vertigo.  Will give patient ENT  follow-up.   ____________________________________________   FINAL CLINICAL IMPRESSION(S) / ED DIAGNOSES  Final diagnoses:  Vertigo     Note: This dictation was prepared with Dragon dictation. Any transcriptional errors that result from this process are unintentional     Nance Pear, MD 01/11/21 [phone]

## 2021-01-11 NOTE — Consult Note (Signed)
[facility] Consult Services   Date of Service:   01/11/2021 20:43:45  Diagnosis:       R42 - Dizziness/ Vertigo/ Giddiness  Impression:      Amber Glenn is a 70yo female with a PMH of HTN, HLD, DM who presents to the ED with an acute onset of dizziness. Still complaining of some dizziness when she moves her head side to side. NIHSS is 0. CT head is negative for acute pathology. Symptoms are most consistent with Vertigo however cannot exclude a small posterior circulation infarct. Thrombolytics are not indicated due to no disabling symptoms and NIHSS 0.    PLAN  - MRI brain w/o contrast; if neg for acute ischemia and pt is otherwise medically stable, ok to dc home  - if MRI is positive, then admit for stroke workup  --  Metrics: Last Known Well: 01/11/2021 19:00:00 [facility] Notification Time: 01/11/2021 20:43:45 Arrival Time: 01/11/2021 20:02:00 Stamp Time: 01/11/2021 20:43:45 Initial Response Time: 01/11/2021 20:44:00 Symptoms: dizziness. NIHSS Start Assessment Time: 01/11/2021 20:55:22 Patient is not a candidate for Thrombolytic. Thrombolytic Medical Decision: 01/11/2021 21:00:05 Patient was not deemed candidate for Thrombolytic because of following reasons: No disabling symptoms.  CT head showed no acute hemorrhage or acute core infarct.  ED Physician notified of diagnostic impression and management plan on 01/11/2021 21:09:46  Advanced Imaging: Advanced Imaging Not Completed because:  Low suspicion for LVO   Our recommendations are outlined below.  Recommendations:        Neuro Checks       Bedside Swallow Eval       DVT Prophylaxis       IV Fluids, Normal Saline       Head of Bed 30 Degrees       Euglycemia and Avoid Hyperthermia (PRN Acetaminophen)   Sign Out:       Discussed with Emergency Department Provider    ------------------------------------------------------------------------------  History of Present  Illness: Patient is a 70 year old Female.  Patient was brought by private transportation with symptoms of dizziness.  Amber Glenn is a 70yo female with a PMH of HTN, HLD, DM who presents to the ED with an acute onset of dizziness. She says that her sx have improved, but in the ED she is complaining of dizziness when she moves her head. She also states that she has numbness of the left leg but she has had this in the past. She also reported an episode of vertigo in the past and states that these current symptoms are similar to the vertigo she experienced before. NIHSS 0, no focal deficits on exam.   Past Medical History:      Hypertension      Diabetes Mellitus      Hyperlipidemia      Covid-19  Anticoagulant use:  No  Antiplatelet use: No  Allergies:  Reviewed    Examination: BP(178/69), Pulse(98), Blood Glucose(115) 1A: Level of Consciousness - Alert; keenly responsive + 0 1B: Ask Month and Age - Both Questions Right + 0 1C: Blink Eyes & Squeeze Hands - Performs Both Tasks + 0 2: Test Horizontal Extraocular Movements - Normal + 0 3: Test Visual Fields - No Visual Loss + 0 4: Test Facial Palsy (Use Grimace if Obtunded) - Normal symmetry + 0 5A: Test Left Arm Motor Drift - No Drift for 10 Seconds + 0 5B: Test Right Arm Motor Drift - No Drift for 10 Seconds + 0 6A: Test Left Leg Motor Drift - No  Drift for 5 Seconds + 0 6B: Test Right Leg Motor Drift - No Drift for 5 Seconds + 0 7: Test Limb Ataxia (FNF/Heel-Shin) - No Ataxia + 0 8: Test Sensation - Normal; No sensory loss + 0 9: Test Language/Aphasia - Normal; No aphasia + 0 10: Test Dysarthria - Normal + 0 11: Test Extinction/Inattention - No abnormality + 0  NIHSS Score: 0  NIHSS Free Text : Naming intact  Repeating intact  Pre-Morbid Modified Rankin Scale: 0 Points = No symptoms at all   Patient/Family was informed the Neurology Consult would occur via TeleHealth consult by way of interactive audio and video  telecommunications and consented to receiving care in this manner.   Patient is being evaluated for possible acute neurologic impairment and high probability of imminent or life-threatening deterioration. I spent total of 20 minutes providing care to this patient, including time for face to face visit via telemedicine, review of medical records, imaging studies and discussion of findings with providers, the patient and/or family.   Dr Burtis Junes   [facility] [phone]  Case [id number]

## 2021-01-11 NOTE — ED Notes (Signed)
[place] sent to the lab.  

## 2021-01-11 NOTE — ED Triage Notes (Signed)
Pt via POV from home accompanied by daughter. Pt c/o dizziness, nausea, AMS (pt was becoming forgetful), weakness, and per daughter some slurred speech. Pt states she started feeling dizzy approx 45 mins ago.  No facial droop noted. No weakness or drift. No numbness. Speech is clear in triage. Denies any recent falls.  Pt A&Ox4 and NAD CBG in triage: 115

## 2021-01-11 NOTE — Discharge Instructions (Signed)
Please seek medical attention for any high fevers, chest pain, shortness of breath, change in behavior, persistent vomiting, bloody stool or any other new or concerning symptoms.  

## 2021-01-11 NOTE — ED Notes (Signed)
This RN spoke with Dr. Ellender Hose regarding patient care. Per Dr. Ellender Hose, initiate code stroke with residual symptoms of dizziness and other symptoms resolved. This RN notified Caryl Pina, Therapist, sports.

## 2021-10-21 ENCOUNTER — Other Ambulatory Visit: Payer: Self-pay | Admitting: Family Medicine

## 2021-10-21 DIAGNOSIS — Z1231 Encounter for screening mammogram for malignant neoplasm of breast: Secondary | ICD-10-CM

## 2021-11-13 ENCOUNTER — Ambulatory Visit
Admission: RE | Admit: 2021-11-13 | Discharge: 2021-11-13 | Disposition: A | Discharge status: Home / Self Care | Payer: Medicare HMO | Source: Ambulatory Visit | Attending: Family Medicine | Admitting: Family Medicine

## 2021-11-13 DIAGNOSIS — Z1231 Encounter for screening mammogram for malignant neoplasm of breast: Secondary | ICD-10-CM | POA: Insufficient documentation

## 2022-02-10 ENCOUNTER — Emergency Department: Payer: Medicare HMO

## 2022-02-10 ENCOUNTER — Encounter: Payer: Self-pay | Admitting: Internal Medicine

## 2022-02-10 ENCOUNTER — Inpatient Hospital Stay
Admission: EM | Admit: 2022-02-10 | Discharge: 2022-02-18 | DRG: 460 | Disposition: A | Discharge status: Home Health | Payer: Medicare HMO | Attending: Student in an Organized Health Care Education/Training Program | Admitting: Student in an Organized Health Care Education/Training Program

## 2022-02-10 ENCOUNTER — Other Ambulatory Visit: Payer: Self-pay

## 2022-02-10 DIAGNOSIS — M8448XA Pathological fracture, other site, initial encounter for fracture: Secondary | ICD-10-CM | POA: Diagnosis not present

## 2022-02-10 DIAGNOSIS — Z85828 Personal history of other malignant neoplasm of skin: Secondary | ICD-10-CM

## 2022-02-10 DIAGNOSIS — M532X6 Spinal instabilities, lumbar region: Secondary | ICD-10-CM | POA: Diagnosis present

## 2022-02-10 DIAGNOSIS — Z7982 Long term (current) use of aspirin: Secondary | ICD-10-CM

## 2022-02-10 DIAGNOSIS — R8281 Pyuria: Secondary | ICD-10-CM | POA: Diagnosis present

## 2022-02-10 DIAGNOSIS — Z8616 Personal history of COVID-19: Secondary | ICD-10-CM

## 2022-02-10 DIAGNOSIS — M1990 Unspecified osteoarthritis, unspecified site: Secondary | ICD-10-CM | POA: Diagnosis present

## 2022-02-10 DIAGNOSIS — M48061 Spinal stenosis, lumbar region without neurogenic claudication: Secondary | ICD-10-CM | POA: Diagnosis present

## 2022-02-10 DIAGNOSIS — M8440XA Pathological fracture, unspecified site, initial encounter for fracture: Secondary | ICD-10-CM | POA: Diagnosis present

## 2022-02-10 DIAGNOSIS — E785 Hyperlipidemia, unspecified: Secondary | ICD-10-CM | POA: Diagnosis present

## 2022-02-10 DIAGNOSIS — C3490 Malignant neoplasm of unspecified part of unspecified bronchus or lung: Secondary | ICD-10-CM | POA: Diagnosis present

## 2022-02-10 DIAGNOSIS — Z87891 Personal history of nicotine dependence: Secondary | ICD-10-CM

## 2022-02-10 DIAGNOSIS — M8458XA Pathological fracture in neoplastic disease, other specified site, initial encounter for fracture: Secondary | ICD-10-CM | POA: Diagnosis not present

## 2022-02-10 DIAGNOSIS — Z7189 Other specified counseling: Secondary | ICD-10-CM

## 2022-02-10 DIAGNOSIS — G893 Neoplasm related pain (acute) (chronic): Secondary | ICD-10-CM | POA: Diagnosis not present

## 2022-02-10 DIAGNOSIS — F419 Anxiety disorder, unspecified: Secondary | ICD-10-CM | POA: Diagnosis present

## 2022-02-10 DIAGNOSIS — R590 Localized enlarged lymph nodes: Secondary | ICD-10-CM

## 2022-02-10 DIAGNOSIS — Z947 Corneal transplant status: Secondary | ICD-10-CM

## 2022-02-10 DIAGNOSIS — F05 Delirium due to known physiological condition: Secondary | ICD-10-CM | POA: Diagnosis not present

## 2022-02-10 DIAGNOSIS — E782 Mixed hyperlipidemia: Secondary | ICD-10-CM

## 2022-02-10 DIAGNOSIS — Z7984 Long term (current) use of oral hypoglycemic drugs: Secondary | ICD-10-CM

## 2022-02-10 DIAGNOSIS — I700 Atherosclerosis of aorta: Secondary | ICD-10-CM | POA: Diagnosis present

## 2022-02-10 DIAGNOSIS — J309 Allergic rhinitis, unspecified: Secondary | ICD-10-CM | POA: Diagnosis present

## 2022-02-10 DIAGNOSIS — B9620 Unspecified Escherichia coli [E. coli] as the cause of diseases classified elsewhere: Secondary | ICD-10-CM | POA: Diagnosis present

## 2022-02-10 DIAGNOSIS — C7951 Secondary malignant neoplasm of bone: Secondary | ICD-10-CM | POA: Diagnosis present

## 2022-02-10 DIAGNOSIS — K5900 Constipation, unspecified: Secondary | ICD-10-CM | POA: Diagnosis present

## 2022-02-10 DIAGNOSIS — K5732 Diverticulitis of large intestine without perforation or abscess without bleeding: Secondary | ICD-10-CM | POA: Diagnosis present

## 2022-02-10 DIAGNOSIS — E119 Type 2 diabetes mellitus without complications: Secondary | ICD-10-CM | POA: Diagnosis present

## 2022-02-10 DIAGNOSIS — Z23 Encounter for immunization: Secondary | ICD-10-CM

## 2022-02-10 DIAGNOSIS — Z79899 Other long term (current) drug therapy: Secondary | ICD-10-CM

## 2022-02-10 DIAGNOSIS — R599 Enlarged lymph nodes, unspecified: Secondary | ICD-10-CM | POA: Diagnosis present

## 2022-02-10 DIAGNOSIS — I2510 Atherosclerotic heart disease of native coronary artery without angina pectoris: Secondary | ICD-10-CM | POA: Diagnosis present

## 2022-02-10 DIAGNOSIS — K219 Gastro-esophageal reflux disease without esophagitis: Secondary | ICD-10-CM | POA: Diagnosis not present

## 2022-02-10 DIAGNOSIS — J9859 Other diseases of mediastinum, not elsewhere classified: Secondary | ICD-10-CM

## 2022-02-10 DIAGNOSIS — C799 Secondary malignant neoplasm of unspecified site: Secondary | ICD-10-CM

## 2022-02-10 DIAGNOSIS — C801 Malignant (primary) neoplasm, unspecified: Secondary | ICD-10-CM

## 2022-02-10 DIAGNOSIS — N3000 Acute cystitis without hematuria: Secondary | ICD-10-CM | POA: Diagnosis present

## 2022-02-10 DIAGNOSIS — F32A Depression, unspecified: Secondary | ICD-10-CM | POA: Diagnosis present

## 2022-02-10 DIAGNOSIS — Z515 Encounter for palliative care: Secondary | ICD-10-CM

## 2022-02-10 DIAGNOSIS — I10 Essential (primary) hypertension: Secondary | ICD-10-CM | POA: Diagnosis present

## 2022-02-10 DIAGNOSIS — M545 Low back pain, unspecified: Secondary | ICD-10-CM

## 2022-02-10 DIAGNOSIS — Z8249 Family history of ischemic heart disease and other diseases of the circulatory system: Secondary | ICD-10-CM

## 2022-02-10 DIAGNOSIS — W1830XA Fall on same level, unspecified, initial encounter: Secondary | ICD-10-CM | POA: Diagnosis present

## 2022-02-10 LAB — URINALYSIS, ROUTINE W REFLEX MICROSCOPIC
Bilirubin Urine: NEGATIVE
Glucose, UA: NEGATIVE mg/dL
Hgb urine dipstick: NEGATIVE
Ketones, ur: 20 mg/dL — AB
Nitrite: POSITIVE — AB
Protein, ur: NEGATIVE mg/dL
Specific Gravity, Urine: 1.026 (ref 1.005–1.030)
pH: 5 (ref 5.0–8.0)

## 2022-02-10 LAB — CBC WITH DIFFERENTIAL/PLATELET
Abs Immature Granulocytes: 0.04 10*3/uL (ref 0.00–0.07)
Basophils Absolute: 0 10*3/uL (ref 0.0–0.1)
Basophils Relative: 0 %
Eosinophils Absolute: 0.2 10*3/uL (ref 0.0–0.5)
Eosinophils Relative: 2 %
HCT: 38.4 % (ref 36.0–46.0)
Hemoglobin: 11.5 g/dL — ABNORMAL LOW (ref 12.0–15.0)
Immature Granulocytes: 0 %
Lymphocytes Relative: 9 %
Lymphs Abs: 0.8 10*3/uL (ref 0.7–4.0)
MCH: 23.7 pg — ABNORMAL LOW (ref 26.0–34.0)
MCHC: 29.9 g/dL — ABNORMAL LOW (ref 30.0–36.0)
MCV: 79.2 fL — ABNORMAL LOW (ref 80.0–100.0)
Monocytes Absolute: 0.4 10*3/uL (ref 0.1–1.0)
Monocytes Relative: 4 %
Neutro Abs: 8.3 10*3/uL — ABNORMAL HIGH (ref 1.7–7.7)
Neutrophils Relative %: 85 %
Platelets: 265 10*3/uL (ref 150–400)
RBC: 4.85 MIL/uL (ref 3.87–5.11)
RDW: 15.9 % — ABNORMAL HIGH (ref 11.5–15.5)
WBC: 9.7 10*3/uL (ref 4.0–10.5)
nRBC: 0 % (ref 0.0–0.2)

## 2022-02-10 LAB — BASIC METABOLIC PANEL
Anion gap: 6 (ref 5–15)
BUN: 17 mg/dL (ref 8–23)
CO2: 28 mmol/L (ref 22–32)
Calcium: 9.7 mg/dL (ref 8.9–10.3)
Chloride: 101 mmol/L (ref 98–111)
Creatinine, Ser: 0.74 mg/dL (ref 0.44–1.00)
GFR, Estimated: >60 mL/min (ref >60)
Glucose, Bld: 140 mg/dL — ABNORMAL HIGH (ref 70–99)
Potassium: 4.4 mmol/L (ref 3.5–5.1)
Sodium: 135 mmol/L (ref 135–145)

## 2022-02-10 LAB — GLUCOSE, CAPILLARY: Glucose-Capillary: 126 mg/dL — ABNORMAL HIGH (ref 70–99)

## 2022-02-10 MED ORDER — MORPHINE SULFATE (PF) 4 MG/ML IV SOLN: 4.0000 mg | INTRAVENOUS | Status: DC | PRN

## 2022-02-10 MED ORDER — HYDROCODONE-ACETAMINOPHEN 5-325 MG PO TABS
1.0000 | ORAL_TABLET | Freq: Four times a day (QID) | ORAL | Status: DC | PRN
  Administered 2022-02-10 – 2022-02-11 (×2): 1 via ORAL
  Filled 2022-02-10 (×3): qty 1

## 2022-02-10 MED ORDER — IBUPROFEN 600 MG PO TABS
600.0000 mg | ORAL_TABLET | ORAL | Status: AC
  Administered 2022-02-10: 600 mg via ORAL
  Filled 2022-02-10: qty 1

## 2022-02-10 MED ORDER — SENNOSIDES-DOCUSATE SODIUM 8.6-50 MG PO TABS: 1.0000 | ORAL_TABLET | Freq: Every evening | ORAL | Status: DC | PRN

## 2022-02-10 MED ORDER — INSULIN ASPART 100 UNIT/ML IJ SOLN: 0.0000 [IU] | Freq: Every day | INTRAMUSCULAR | Status: DC

## 2022-02-10 MED ORDER — HYDROMORPHONE HCL 1 MG/ML IJ SOLN
1.0000 mg | INTRAMUSCULAR | Status: DC | PRN
  Administered 2022-02-10 – 2022-02-11 (×3): 1 mg via INTRAVENOUS
  Filled 2022-02-10 (×3): qty 1

## 2022-02-10 MED ORDER — INSULIN ASPART 100 UNIT/ML IJ SOLN
0.0000 [IU] | Freq: Three times a day (TID) | INTRAMUSCULAR | Status: DC
  Filled 2022-02-10 (×2): qty 1

## 2022-02-10 MED ORDER — HYDROMORPHONE HCL 1 MG/ML IJ SOLN: 1.0000 mg | INTRAMUSCULAR | Status: DC | PRN

## 2022-02-10 MED ORDER — ACETAMINOPHEN 325 MG PO TABS: 650.0000 mg | ORAL_TABLET | Freq: Four times a day (QID) | ORAL | Status: DC | PRN

## 2022-02-10 MED ORDER — INFLUENZA VAC A&B SA ADJ QUAD 0.5 ML IM PRSY
0.5000 mL | PREFILLED_SYRINGE | INTRAMUSCULAR | Status: DC
  Filled 2022-02-10: qty 0.5

## 2022-02-10 MED ORDER — IOHEXOL 300 MG/ML  SOLN
100.0000 mL | Freq: Once | INTRAMUSCULAR | Status: AC | PRN
  Administered 2022-02-10: 100 mL via INTRAVENOUS

## 2022-02-10 MED ORDER — GABAPENTIN 100 MG PO CAPS
100.0000 mg | ORAL_CAPSULE | Freq: Three times a day (TID) | ORAL | Status: DC
  Administered 2022-02-10 – 2022-02-18 (×20): 100 mg via ORAL
  Filled 2022-02-10 (×20): qty 1

## 2022-02-10 MED ORDER — FENTANYL CITRATE PF 50 MCG/ML IJ SOSY
50.0000 ug | PREFILLED_SYRINGE | Freq: Once | INTRAMUSCULAR | Status: AC
Start: 2022-02-10 — End: 2022-02-10
  Administered 2022-02-10: 50 ug via INTRAVENOUS
  Filled 2022-02-10: qty 1

## 2022-02-10 MED ORDER — METHOCARBAMOL 500 MG PO TABS
500.0000 mg | ORAL_TABLET | Freq: Three times a day (TID) | ORAL | Status: DC
  Administered 2022-02-10 – 2022-02-11 (×2): 500 mg via ORAL
  Filled 2022-02-10 (×3): qty 1

## 2022-02-10 MED ORDER — ALUM & MAG HYDROXIDE-SIMETH 200-200-20 MG/5ML PO SUSP
30.0000 mL | ORAL | Status: DC | PRN
  Administered 2022-02-10 – 2022-02-16 (×3): 30 mL via ORAL
  Filled 2022-02-10 (×3): qty 30

## 2022-02-10 MED ORDER — DOCUSATE SODIUM 100 MG PO CAPS
100.0000 mg | ORAL_CAPSULE | Freq: Every day | ORAL | Status: DC
  Administered 2022-02-10 – 2022-02-11 (×2): 100 mg via ORAL
  Filled 2022-02-10 (×2): qty 1

## 2022-02-10 MED ORDER — HEPARIN SODIUM (PORCINE) 5000 UNIT/ML IJ SOLN
5000.0000 [IU] | Freq: Three times a day (TID) | INTRAMUSCULAR | Status: DC
  Administered 2022-02-10 – 2022-02-14 (×12): 5000 [IU] via SUBCUTANEOUS
  Filled 2022-02-10 (×13): qty 1

## 2022-02-10 MED ORDER — ONDANSETRON HCL 4 MG/2ML IJ SOLN
4.0000 mg | Freq: Once | INTRAMUSCULAR | Status: AC
Start: 2022-02-10 — End: 2022-02-10
  Administered 2022-02-10: 4 mg via INTRAVENOUS
  Filled 2022-02-10: qty 2

## 2022-02-10 MED ORDER — ONDANSETRON HCL 4 MG/2ML IJ SOLN
4.0000 mg | Freq: Four times a day (QID) | INTRAMUSCULAR | Status: AC | PRN
  Administered 2022-02-11: 4 mg via INTRAVENOUS
  Filled 2022-02-10: qty 2

## 2022-02-10 MED ORDER — HYDROMORPHONE HCL 1 MG/ML IJ SOLN
0.5000 mg | Freq: Once | INTRAMUSCULAR | Status: AC
  Administered 2022-02-10: 0.5 mg via INTRAVENOUS
  Filled 2022-02-10: qty 0.5

## 2022-02-10 MED ORDER — SIMVASTATIN 20 MG PO TABS
40.0000 mg | ORAL_TABLET | Freq: Every day | ORAL | Status: DC
  Administered 2022-02-10 – 2022-02-17 (×8): 40 mg via ORAL
  Filled 2022-02-10 (×8): qty 2

## 2022-02-10 MED ORDER — HYDROMORPHONE HCL 1 MG/ML IJ SOLN
0.5000 mg | INTRAMUSCULAR | Status: DC | PRN
  Administered 2022-02-10 – 2022-02-11 (×2): 0.5 mg via INTRAVENOUS
  Filled 2022-02-10: qty 0.5
  Filled 2022-02-10: qty 1

## 2022-02-10 MED ORDER — HYDROMORPHONE HCL 1 MG/ML IJ SOLN: 0.5000 mg | INTRAMUSCULAR | Status: DC | PRN

## 2022-02-10 MED ORDER — ACETAMINOPHEN 500 MG PO TABS
1000.0000 mg | ORAL_TABLET | Freq: Once | ORAL | Status: AC
  Administered 2022-02-10: 1000 mg via ORAL
  Filled 2022-02-10: qty 2

## 2022-02-10 MED ORDER — MORPHINE SULFATE (PF) 2 MG/ML IV SOLN
2.0000 mg | Freq: Once | INTRAVENOUS | Status: AC
  Administered 2022-02-10: 2 mg via INTRAVENOUS
  Filled 2022-02-10: qty 1

## 2022-02-10 MED ORDER — ACETAMINOPHEN 650 MG RE SUPP: 650.0000 mg | Freq: Four times a day (QID) | RECTAL | Status: DC | PRN

## 2022-02-10 MED ORDER — AZITHROMYCIN 500 MG PO TABS
500.0000 mg | ORAL_TABLET | Freq: Once | ORAL | Status: AC
  Administered 2022-02-10: 500 mg via ORAL
  Filled 2022-02-10: qty 1

## 2022-02-10 MED ORDER — CEPHALEXIN 500 MG PO CAPS
500.0000 mg | ORAL_CAPSULE | Freq: Once | ORAL | Status: AC
  Administered 2022-02-10: 500 mg via ORAL
  Filled 2022-02-10: qty 1

## 2022-02-10 MED ORDER — ONDANSETRON HCL 4 MG PO TABS: 4.0000 mg | ORAL_TABLET | Freq: Four times a day (QID) | ORAL | Status: AC | PRN

## 2022-02-10 MED ORDER — HYDRALAZINE HCL 20 MG/ML IJ SOLN: 5.0000 mg | Freq: Three times a day (TID) | INTRAMUSCULAR | Status: AC | PRN

## 2022-02-10 MED ORDER — VITAMIN C 500 MG PO TABS
1000.0000 mg | ORAL_TABLET | Freq: Every day | ORAL | Status: DC
  Administered 2022-02-10 – 2022-02-18 (×8): 1000 mg via ORAL
  Filled 2022-02-10 (×8): qty 2

## 2022-02-10 MED ORDER — SODIUM CHLORIDE 0.9 % IV BOLUS
500.0000 mL | Freq: Once | INTRAVENOUS | Status: AC
Start: 2022-02-10 — End: 2022-02-10
  Administered 2022-02-10: 500 mL via INTRAVENOUS

## 2022-02-10 MED ORDER — METFORMIN HCL 500 MG PO TABS
500.0000 mg | ORAL_TABLET | Freq: Every day | ORAL | Status: DC
  Administered 2022-02-11 – 2022-02-18 (×7): 500 mg via ORAL
  Filled 2022-02-10 (×7): qty 1

## 2022-02-10 MED ORDER — LOSARTAN POTASSIUM 50 MG PO TABS
100.0000 mg | ORAL_TABLET | Freq: Every day | ORAL | Status: DC
  Administered 2022-02-11: 100 mg via ORAL
  Filled 2022-02-10 (×2): qty 2

## 2022-02-10 MED ORDER — CARVEDILOL 3.125 MG PO TABS
3.1250 mg | ORAL_TABLET | Freq: Two times a day (BID) | ORAL | Status: DC
  Administered 2022-02-11 – 2022-02-18 (×13): 3.125 mg via ORAL
  Filled 2022-02-10 (×14): qty 1

## 2022-02-10 NOTE — ED Notes (Signed)
See triage note. Pt has L1 fx since early Sept. Pain is unbearable now, here for pain control. Denies urinary changes/incontinence or motor weakness. Too painful to sit up.

## 2022-02-10 NOTE — ED Notes (Signed)
Pt continues to complain of pain and is asking for water. Pt still needs to go for MRI.

## 2022-02-10 NOTE — Assessment & Plan Note (Signed)
-   Insulin SSI with at bedtime coverage ordered °

## 2022-02-10 NOTE — ED Notes (Signed)
Pt on phone with MRI screener.

## 2022-02-10 NOTE — ED Notes (Signed)
HR is 120. Provider messaged.

## 2022-02-10 NOTE — Assessment & Plan Note (Addendum)
-   Losartan 100 mg daily resumed, carvedilol 3.125 mg p.o. twice daily resumed - Hydralazine 5 mg IV every 8 hours.  For SBP greater than 180, 4 days ordered

## 2022-02-10 NOTE — ED Notes (Signed)
Informed RN bed assigned 

## 2022-02-10 NOTE — ED Notes (Signed)
Pt in MRI.

## 2022-02-10 NOTE — ED Provider Notes (Signed)
[facility] Provider Note    Event Date/Time   First MD Initiated Contact with Patient 02/10/22 1124     (approximate)   History   Back Pain   HPI  Amber Glenn is a 71 y.o. female presents to the emergency department for treatment and evaluation of low back pain.  She sustained a mechanical, nonsyncopal fall several weeks ago and sustained a L1 closed wedge compression fracture.  She has an MRI scheduled for tomorrow but is unable to tolerate the pain any longer.  She has had no relief with Norco, oxycodone, ibuprofen, and Tylenol.  She reports constipation but denies incontinence of urine.  Pain radiates to both hips but does not go down into her legs.     Physical Exam   Triage Vital Signs: ED Triage Vitals  Enc Vitals Group     BP 02/10/22 1119 (!) 157/91     Pulse Rate 02/10/22 1119 (!) 130     Resp 02/10/22 1119 17     Temp 02/10/22 1119 98.2 F (36.8 C)     Temp Source 02/10/22 1119 Oral     SpO2 02/10/22 1119 100 %     Weight 02/10/22 1120 131 lb (59.4 kg)     Height --      Head Circumference --      Peak Flow --      Pain Score 02/10/22 1119 10     Pain Loc --      Pain Edu? --      Excl. in [place]? --     Most recent vital signs: Vitals:   02/10/22 1315 02/10/22 1458  BP:  (!) 152/74  Pulse:  (!) 120  Resp: 20 13  Temp:    SpO2:  100%     General: Awake, no distress.  CV:  Good peripheral perfusion.  Resp:  Normal effort.  Abd:  No distention.  Other:  Motor and sensory function of the lower extremities is intact.   ED Results / Procedures / Treatments   Labs (all labs ordered are listed, but only abnormal results are displayed) Labs Reviewed  BASIC METABOLIC PANEL - Abnormal; Notable for the following components:      Result Value   Glucose, Bld 140 (*)    All other components within normal limits  CBC WITH DIFFERENTIAL/PLATELET - Abnormal; Notable for the following components:   Hemoglobin 11.5 (*)    MCV 79.2  (*)    MCH 23.7 (*)    MCHC 29.9 (*)    RDW 15.9 (*)    Neutro Abs 8.3 (*)    All other components within normal limits  URINALYSIS, ROUTINE W REFLEX MICROSCOPIC - Abnormal; Notable for the following components:   Color, Urine YELLOW (*)    APPearance HAZY (*)    Ketones, ur 20 (*)    Nitrite POSITIVE (*)    Leukocytes,Ua SMALL (*)    Bacteria, UA MANY (*)    All other components within normal limits     EKG  Not indicated   RADIOLOGY  MR lumbar spine pending.    PROCEDURES:  Critical Care performed: No  Procedures   MEDICATIONS ORDERED IN ED: Medications  fentaNYL (SUBLIMAZE) injection 50 mcg (50 mcg Intravenous Given 02/10/22 1154)  ondansetron (ZOFRAN) injection 4 mg (4 mg Intravenous Given 02/10/22 1154)  sodium chloride 0.9 % bolus 500 mL (0 mLs Intravenous Stopped 02/10/22 1311)  morphine (PF) 2 MG/ML injection 2 mg (2 mg  Intravenous Given 02/10/22 1321)  ibuprofen (ADVIL) tablet 600 mg (600 mg Oral Given 02/10/22 1432)     IMPRESSION / MDM / ASSESSMENT AND PLAN / ED COURSE  I reviewed the triage vital signs and the nursing notes.                              Differential diagnosis includes, but is not limited to, posttraumatic back pain, degenerative disc disease, bulging/herniated disc.  Patient's presentation is most consistent with acute complicated illness / injury requiring diagnostic workup.  71 year old female presenting to the emergency department for treatment and evaluation of ongoing back pain not well controlled with her home medications.  See HPI for further details.  Patient states that the pain has gotten so bad that she has not felt like eating or drinking anything and has lost approximately 10 pounds.  She is tachycardic at 130.  This may be related to dehydration or pain.  Plan will be to give her a gentle saline bolus 500 mL and fentanyl 50 with Zofran for pain.  Heart rate down to 104 after the fluid bolus and pain medication.  Pain is  better controlled but not completely gone.  Oxygen saturations are 90 to 93% on room air.  Patient states that her oxygen level sometimes goes down especially if she is relaxing.  She denies feeling shortness of breath or experiencing any type of chest pain.  Plan will be to go ahead and get the MRI image of the lumbar spine since this she has had narcotic medications at home for the past several days that have not provided any relief.  She also had a round of prednisone and states that that did not help at all.  Clinical Course as of 02/10/22 1521  Wed Feb 10, 2022  1314 Patient requesting more pain medication. She will likely need it to lie flat for MRI. Morphine 2mg  ordered and will have RN apply oxygen at 2l via . [CT]  1508 Patient requesting additional pain medication. Ibuprofen was ordered. She is now going to MR. She is noted to have a nitrate postitve UTI.  [CT]  [place] relinquished to Dr. Kerman Passey who will follow up on MR results and reassess patient to determine disposition. [CT]    Clinical Course User Index [CT] Ivie Maese B, FNP     FINAL CLINICAL IMPRESSION(S) / ED DIAGNOSES   Final diagnoses:  None   Lumbar back pain; acute cystitis   Rx / DC Orders   ED Discharge Orders     None        Note:  This document was prepared using Dragon voice recognition software and may include unintentional dictation errors.   Victorino Dike, FNP 02/10/22 1522    Merlyn Lot, MD 02/10/22 1550

## 2022-02-10 NOTE — ED Notes (Signed)
Family calling out again for RN. She states pt needs pain meds again. Informed EDP that pt pain is not controlled.

## 2022-02-10 NOTE — Progress Notes (Signed)
   02/10/22 2024  Assess: MEWS Score  Temp (!) 97.5 F (36.4 C)  BP (!) 156/94  MAP (mmHg) 109  Pulse Rate (!) 119  Resp 19  SpO2 97 %  O2 Device Nasal Cannula  O2 Flow Rate (L/min) 2 L/min  Assess: MEWS Score  MEWS Temp 0  MEWS Systolic 0  MEWS Pulse 2  MEWS RR 0  MEWS LOC 0  MEWS Score 2  MEWS Score Color Yellow  Assess: if the MEWS score is Yellow or Red  Were vital signs taken at a resting state? Yes  Focused Assessment No change from prior assessment  Does the patient meet 2 or more of the SIRS criteria? Yes  Does the patient have a confirmed or suspected source of infection? No  MEWS guidelines implemented *See Row Information* Yes  Treat  MEWS Interventions Administered prn meds/treatments  Pain Scale 0-10  Pain Score 7  Pain Type Acute pain  Pain Location Back  Pain Orientation Lower  Pain Frequency Intermittent  Pain Onset Progressive  Pain Intervention(s) Repositioned;Medication (See eMAR)  Take Vital Signs  Increase Vital Sign Frequency  Yellow: Q 2hr X 2 then Q 4hr X 2, if remains yellow, continue Q 4hrs  Escalate  MEWS: Escalate Yellow: discuss with charge nurse/RN and consider discussing with provider and RRT  Notify: Charge Nurse/RN  Name of Charge Nurse/RN Notified Wilacynt,RN  Date Charge Nurse/RN Notified 02/10/22  Time Charge Nurse/RN Notified 2030  Document  Patient Outcome Other (Comment) (pt stable and remains on unit, will continue to monitor for further intervention)  Progress note created (see row info) Yes  Assess: SIRS CRITERIA  SIRS Temperature  0  SIRS Pulse 1  SIRS Respirations  0  SIRS WBC 1  SIRS Score Sum  2

## 2022-02-10 NOTE — H&P (Signed)
History and Physical   [name] XBD:[id number] DOB: 05-22-1950 DOA: 02/10/2022  PCP: Lynnell Jude, MD  Patient coming from: Home  I have personally briefly reviewed patient's old medical records in [place].  Chief Concern: Low back pain  HPI: [name] is a 71 year old female with history of hypertension, hyperlipidemia, non-insulin-dependent diabetes mellitus, depression, anxiety, who presents emergency department for chief concerns of low back pain not relieved with home pain medications.  Initial vitals in the emergency department showed temperature of 98.2, respiration rate of 17, heart rate of 130, blood pressure 157/91, SPO2 of 100% on room air.  Serum sodium is 135, potassium 4.4, chloride 101, bicarb 28, BUN of 17, serum creatinine 0.74, nonfasting glucose 140, EGFR greater than 60, WBC 9.7, hemoglobin 11.5, platelets of 265.  ED treatment: Keflex 500 mg p.o. one-time dose, fentanyl 50 mcg one-time dose, morphine one-time dose, Dilaudid 0.5 mg IV one-time dose, ondansetron.  At bedside, she is able to tell me her name, age, current location, current calendar year. She reports she woke up in September with her back hurting. She reports at her peak, the pain is a 10/10 and currently a 6-7/10. She denies trauma to her person.   She endorses constipation since constipation and regular brown. She states the colonoscopy was a long time ago.  Social history: She lives with her daughter and two grand children. She is a former tobacco user, quitting in 2010. At her peak, she was smoking 2 ppd. She denies etoh and recreational drug use.   ROS: Constitutional: no weight change, no fever ENT/Mouth: no sore throat, no rhinorrhea Eyes: no eye pain, no vision changes Cardiovascular: no chest pain, no dyspnea,  no edema, no palpitations Respiratory: no cough, no sputum, no wheezing Gastrointestinal: no nausea, no vomiting, no diarrhea, no constipation Genitourinary: no  urinary incontinence, no dysuria, no hematuria Musculoskeletal: no arthralgias, no myalgias Skin: no skin lesions, no pruritus, Neuro: + weakness, no loss of consciousness, no syncope Psych: no anxiety, no depression, + decrease appetite Heme/Lymph: no bruising, no bleeding  ED Course: Gust with emergency medicine provider, patient requiring hospitalization for chief concerns of new diagnosis of cancer and pain control.  Assessment/Plan  Principal Problem:   Pathologic fracture Active Problems:   Diabetes mellitus type 2, controlled ([facility])   Hyperlipidemia   GERD   Hypertension   Pyuria   Assessment and Plan:  * Pathologic fracture L1 pathologic fracture with diffuse abnormal lysis involving vertebral body and bilateral pedicles - Neurosurgeon does not think from an neurosurgical perspective would recommend fitting patient with a TLSO brace - Oncology, Dr. 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 - Morphine 4 mg IV every 4 hours as needed for moderate pain, 4 doses ordered; Dilaudid 0.5 mg IV every 3 hours as needed for severe pain, 4 doses ordered; Dilaudid 1 mg IV every 3 hours as needed for pain not controlled with IV morphine or Dilaudid, 4 doses ordered - Patient reports that the morphine did not do anything for her and requested that I discontinue that medication - Patient states that she would rather have acetaminophen and or IV Dilaudid - AM team to reevaluate patient at bedside for continued opioid medication requirements  Hypertension - Losartan 100 mg daily resumed, carvedilol 3.125 mg p.o. twice  daily resumed - Hydralazine 5 mg IV every 8 hours.  For SBP greater than 180, 4 days ordered  Hyperlipidemia - Simvastatin 40 mg daily resumed  Diabetes mellitus type 2, controlled  ([facility]) - Insulin SSI with at bedtime coverage ordered  DVT prophylaxis-I have ordered heparin 5000 units subcutaneous every 8 hours on admission due to the patient's increased risk of DVT - AM team to discontinue pharmacologic DVT prophylaxis prior to procedure pending oncology evaluation  Chart reviewed.   DVT prophylaxis: Heparin 5000 units subcutaneous every 8 hours Code Status: Full code Diet: Heart healthy/carb modified Family Communication: Updated granddaughter at bedside with patient's permission, Ryan Disposition Plan: Pending clinical course Consults called: Oncology, neurosurgery Admission status: MedSurg, observation  Past Medical History:  Diagnosis Date   Arthritis    hands and feet   Cataract    right   Complication of anesthesia    sometimes have a hard time waking up   Diabetes mellitus    Borderline Diabetes   GERD (gastroesophageal reflux disease)    HTN (hypertension)    Hyperlipidemia    Motion sickness    Post corneal transplant    right   Past Surgical History:  Procedure Laterality Date   BASAL CELL CARCINOMA EXCISION     Dr. Manley Mason   CARPAL TUNNEL RELEASE     right   COLONOSCOPY WITH PROPOFOL N/A 12/03/2015   Procedure: COLONOSCOPY WITH PROPOFOL;  Surgeon: Manya Silvas, MD;  Location: [facility];  Service: Endoscopy;  Laterality: N/A;   COLONOSCOPY WITH PROPOFOL N/A 02/22/2020   Procedure: COLONOSCOPY WITH PROPOFOL;  Surgeon: Lesly Rubenstein, MD;  Location: [facility];  Service: Endoscopy;  Laterality: N/A;   ESOPHAGOGASTRODUODENOSCOPY (EGD) WITH PROPOFOL N/A 02/22/2020   Procedure: ESOPHAGOGASTRODUODENOSCOPY (EGD) WITH PROPOFOL;  Surgeon: Lesly Rubenstein, MD;  Location: [facility];  Service: Endoscopy;  Laterality: N/A;   HAMMER TOE SURGERY Right 05/23/2019   Procedure: HAMMER TOE CORRECTION;  Surgeon: Samara Deist, DPM;  Location: [facility];  Service: Podiatry;  Laterality: Right;  Diabetic   OSTECTOMY Right  05/23/2019   Procedure: DOUBLE OSTEOTOMY RIGHT;  Surgeon: Samara Deist, DPM;  Location: [facility];  Service: Podiatry;  Laterality: Right;   SKIN CANCER EXCISION     Social History:  reports that she quit smoking about 13 years ago. She has never used smokeless tobacco. She reports that she does not drink alcohol and does not use drugs.  No Known Allergies Family History  Problem Relation Age of Onset   Heart disease Mother    Heart disease Father    Heart disease Maternal Grandmother    Heart disease Maternal Grandfather    Breast cancer Sister 29   Family history: Family history reviewed and not pertinent  Prior to Admission medications   Medication Sig Start Date End Date Taking? Authorizing Provider  Ascorbic Acid (VITAMIN C) 1000 MG tablet Take 1,000 mg by mouth daily.      [provider]  aspirin 81 MG tablet Take 81 mg by mouth daily.   Patient not taking: Reported on 02/22/2020    [provider]  Blood Glucose Monitoring Suppl (TRUE METRIX AIR GLUCOSE METER) DEVI 1 each by Does not apply route daily. Check sugar once daily Dx E11.9 06/17/14   Jackolyn Confer, MD  carvedilol (COREG) 6.25 MG tablet TAKE 1 TABLET TWICE DAILY WITH MEALS 10/07/15   Jackolyn Confer, MD  docusate sodium (COLACE) 100 MG capsule Take 100 mg by  mouth daily.     [provider]  furosemide (LASIX) 40 MG tablet Take 1 tablet (40 mg total) by mouth daily. 12/20/14   Jackolyn Confer, MD  glucose blood (TRUE METRIX BLOOD GLUCOSE TEST) test strip Check sugar once daily Dx E11.9 06/17/14   Jackolyn Confer, MD  guaiFENesin-dextromethorphan (ROBITUSSIN DM) 100-10 MG/5ML syrup Take 10 mLs by mouth every 4 (four) hours as needed for cough. 08/22/19   Max Sane, MD  loratadine (ALLERGY) 10 MG tablet Take 10 mg by mouth daily.    [provider]  losartan (COZAAR) 25 MG tablet TAKE 1 TABLET EVERY DAY 03/12/15   Jackolyn Confer, MD  meclizine (ANTIVERT) 25 MG  tablet Take 1 tablet (25 mg total) by mouth 3 (three) times daily as needed for dizziness. 01/11/21   Nance Pear, MD  metFORMIN (GLUCOPHAGE) 500 MG tablet TAKE 1 TABLET TWICE DAILY WITH A MEAL 05/27/15   Jackolyn Confer, MD  Multiple Vitamin (MULTIVITAMIN) capsule Take 1 capsule by mouth daily.      [provider]  omeprazole (PRILOSEC) 20 MG capsule Take 1 capsule (20 mg total) by mouth daily. 12/20/14   Jackolyn Confer, MD  phenol (CHLORASEPTIC) 1.4 % LIQD Use as directed 1 spray in the mouth or throat as needed for throat irritation / pain. 08/22/19   Max Sane, MD  prednisoLONE acetate (PRED MILD) 0.12 % ophthalmic suspension Place 1 drop into the right eye daily.    [provider]  predniSONE (STERAPRED UNI-PAK 21 TAB) 10 MG (21) TBPK tablet Start 60 mg p.o. daily, taper 10 mg daily until done Patient not taking: Reported on 02/22/2020 08/22/19   Max Sane, MD  simvastatin (ZOCOR) 40 MG tablet TAKE 1 TABLET EVERY DAY  AT  6:00 PM 12/20/14   Jackolyn Confer, MD  TRUEPLUS LANCETS 28G MISC Check sugar once daily Dx E11.9 06/17/14   Jackolyn Confer, MD  zinc sulfate 220 (50 Zn) MG capsule Take 1 capsule (220 mg total) by mouth 2 (two) times daily. Patient not taking: Reported on 02/22/2020 08/22/19   Max Sane, MD   Physical Exam: Vitals:   02/10/22 1300 02/10/22 1315 02/10/22 1458 02/10/22 1553  BP: (!) 154/72  (!) 152/74 (!) 152/78  Pulse: (!) 112  (!) 120 (!) 124  Resp: _0 Temp:    98.1 F (36.7 C)  TempSrc:    Oral  SpO2: 93%  100% 97%  Weight:       Constitutional: appears age-appropriate, NAD, calm, comfortable Eyes: PERRL, lids and conjunctivae normal ENMT: Mucous membranes are moist. Posterior pharynx clear of any exudate or lesions. Age-appropriate dentition. Hearing appropriate Neck: normal, supple, no masses, no thyromegaly Respiratory: clear to auscultation bilaterally, no wheezing, no crackles. Normal respiratory effort. No  accessory muscle use.  Cardiovascular: Regular rate and rhythm, no murmurs / rubs / gallops. No extremity edema. 2+ pedal pulses. No carotid bruits.  Abdomen: no tenderness, no masses palpated, no hepatosplenomegaly. Bowel sounds positive.  Musculoskeletal: no clubbing / cyanosis. No joint deformity upper and lower extremities. Good ROM, no contractures, no atrophy. Normal muscle tone.  Low back pain. Skin: no rashes, lesions, ulcers. No induration Neurologic: Sensation intact. Strength 5/5 in all 4.  Psychiatric: Normal judgment and insight. Alert and oriented x 3. Normal mood.   EKG: independently reviewed, showing sinus tachycardia with rate of 108, QTc 436  Chest x-ray on Admission: Not indicated at this time  CT  T-SPINE NO CHARGE  Result Date: 02/10/2022 CLINICAL DATA:  Pathologic fracture at L1 EXAM: CT THORACIC AND LUMBAR SPINE WITHOUT CONTRAST TECHNIQUE: Multidetector CT imaging of the thoracic and lumbar spine was performed without contrast. Multiplanar CT image reconstructions were also generated. RADIATION DOSE REDUCTION: This exam was performed according to the departmental dose-optimization program which includes automated exposure control, adjustment of the mA and/or kV according to patient size and/or use of iterative reconstruction technique. COMPARISON:  MRI lumbar spine 02/11/2012 FINDINGS: CT THORACIC SPINE FINDINGS Alignment: No vertebral subluxation is observed. Thoracic kyphosis noted. Vertebrae: No thoracic spine fracture or acute bony finding. There is multilevel bridging spurring anterior to the thoracic spine at the T2 through T10 levels. Paraspinal and other soft tissues: Please see dedicated CT chest report. Disc levels: No substantial thoracic spine impingement is noted. There is left paracentral posterior intervertebral spurring at the T8-9 level but not causing central narrowing of the thecal sac. CT LUMBAR SPINE FINDINGS Segmentation: The lowest lumbar type  non-rib-bearing vertebra is labeled as L5. Alignment: 5 mm of grade 1 degenerative anterolisthesis at L4-5. Vertebrae: Pathologic fracture at L1 with about 65% loss of vertebral body height, with the posterior contour of the vertebral body bulging back 6 mm in a manner highly suspicious for pathologic underlying tumor. The abnormal associated lucency in the vertebral body extends into both pedicles and into the transverse processes (right greater than left, with expansion of the pedicles and right transverse process. There is some mild extension into the right lamina on image 25 series 4. Mild degenerative endplate sclerosis at the L3-4 and L4-5 levels with loss of intervertebral disc height. No other lytic lesions are identified in the lumbar spine. Paraspinal and other soft tissues: Please see dedicated CT abdomen report. Disc levels: T12-L1: Borderline bilateral foraminal stenosis due to tumor expansion of the bilateral pedicles. Cannot exclude right subarticular lateral recess stenosis given the tumor expansion in this vicinity borderline central narrowing of the thecal sac due to posterior bony retropulsion/bulging. L1-2: No impingement. L2-3: No impingement.  Diffuse disc bulge. L3-4: No impingement.  Right eccentric disc bulge. L4-5: Moderate to prominent right and mild left foraminal stenosis and borderline central narrowing of the thecal sac due to subluxation, disc uncovering, and facet arthropathy. L5-S1: No impingement.  Central disc protrusion. IMPRESSION: 1. L1 pathologic fracture with 65% loss of vertebral body height and posterior bulging of the vertebral body by 0.6 cm. Tumor involvement of the bilateral pedicles, right greater than left transverse process, and right lamina. Bony expansion at this level contributes to borderline impingement at T12-L1. No other osseous metastatic disease in the thoracolumbar spine identified. 2. Moderate to prominent impingement at L4-5 due to subluxation with disc  uncovering and facet arthropathy. Electronically Signed   By: Van Clines M.D.   On: 02/10/2022 17:37   CT L-SPINE NO CHARGE  Result Date: 02/10/2022 CLINICAL DATA:  Pathologic fracture at L1 EXAM: CT THORACIC AND LUMBAR SPINE WITHOUT CONTRAST TECHNIQUE: Multidetector CT imaging of the thoracic and lumbar spine was performed without contrast. Multiplanar CT image reconstructions were also generated. RADIATION DOSE REDUCTION: This exam was performed according to the departmental dose-optimization program which includes automated exposure control, adjustment of the mA and/or kV according to patient size and/or use of iterative reconstruction technique. COMPARISON:  MRI lumbar spine 02/11/2012 FINDINGS: CT THORACIC SPINE FINDINGS Alignment: No vertebral subluxation is observed. Thoracic kyphosis noted. Vertebrae: No thoracic spine fracture or acute bony finding. There is multilevel bridging spurring anterior to  the thoracic spine at the T2 through T10 levels. Paraspinal and other soft tissues: Please see dedicated CT chest report. Disc levels: No substantial thoracic spine impingement is noted. There is left paracentral posterior intervertebral spurring at the T8-9 level but not causing central narrowing of the thecal sac. CT LUMBAR SPINE FINDINGS Segmentation: The lowest lumbar type non-rib-bearing vertebra is labeled as L5. Alignment: 5 mm of grade 1 degenerative anterolisthesis at L4-5. Vertebrae: Pathologic fracture at L1 with about 65% loss of vertebral body height, with the posterior contour of the vertebral body bulging back 6 mm in a manner highly suspicious for pathologic underlying tumor. The abnormal associated lucency in the vertebral body extends into both pedicles and into the transverse processes (right greater than left, with expansion of the pedicles and right transverse process. There is some mild extension into the right lamina on image 25 series 4. Mild degenerative endplate sclerosis at  the L3-4 and L4-5 levels with loss of intervertebral disc height. No other lytic lesions are identified in the lumbar spine. Paraspinal and other soft tissues: Please see dedicated CT abdomen report. Disc levels: T12-L1: Borderline bilateral foraminal stenosis due to tumor expansion of the bilateral pedicles. Cannot exclude right subarticular lateral recess stenosis given the tumor expansion in this vicinity borderline central narrowing of the thecal sac due to posterior bony retropulsion/bulging. L1-2: No impingement. L2-3: No impingement.  Diffuse disc bulge. L3-4: No impingement.  Right eccentric disc bulge. L4-5: Moderate to prominent right and mild left foraminal stenosis and borderline central narrowing of the thecal sac due to subluxation, disc uncovering, and facet arthropathy. L5-S1: No impingement.  Central disc protrusion. IMPRESSION: 1. L1 pathologic fracture with 65% loss of vertebral body height and posterior bulging of the vertebral body by 0.6 cm. Tumor involvement of the bilateral pedicles, right greater than left transverse process, and right lamina. Bony expansion at this level contributes to borderline impingement at T12-L1. No other osseous metastatic disease in the thoracolumbar spine identified. 2. Moderate to prominent impingement at L4-5 due to subluxation with disc uncovering and facet arthropathy. Electronically Signed   By: Van Clines M.D.   On: 02/10/2022 17:37   CT CHEST ABDOMEN PELVIS W CONTRAST  Result Date: 02/10/2022 CLINICAL DATA:  Occult malignancy, staging workup.  Severe pain. * Tracking Code: BO * EXAM: CT CHEST, ABDOMEN, AND PELVIS WITH CONTRAST TECHNIQUE: Multidetector CT imaging of the chest, abdomen and pelvis was performed following the standard protocol during bolus administration of intravenous contrast. RADIATION DOSE REDUCTION: This exam was performed according to the departmental dose-optimization program which includes automated exposure control,  adjustment of the mA and/or kV according to patient size and/or use of iterative reconstruction technique. CONTRAST:  15m OMNIPAQUE IOHEXOL 300 MG/ML  SOLN COMPARISON:  Lumbar MRI 02/10/2022 and CT abdomen pelvis 01/02/2013 FINDINGS: CT CHEST FINDINGS Cardiovascular: Coronary, aortic arch, and branch vessel atherosclerotic vascular disease. Mediastinum/Nodes: Centrally necrotic 3.1 by 2.4 by 2.9 cm anterior mediastinal mass in the left prevascular region on image 17 series 2, with mild mass effect on the brachiocephalic vein as shown on image 16 series 2. This is in the immediate vicinity of the phrenic nerve, and the elevation of the left hemidiaphragm is new compared to 08/11/2019 chest radiograph, raising suspicion for phrenic nerve impingement leading to diaphragmatic dysfunction. Left supraclavicular node, 1.0 cm in short axis on image 9 series 2. Right eccentric subcarinal node 1.0 cm in short axis on image 28 series 2. Lungs/Pleura: Scattered scarring and/or atelectasis in the  left lower lobe and lingula. This is particularly notable anteriorly in the left lower lobe. Bronchiectasis and volume loss medially in the right middle lobe for example on image 98 series 3. Scattered tree-in-bud reticulonodular opacities are present in the lungs, right greater than left, characteristic for atypical infectious bronchiolitis. In light of the pathologic adenopathy in the chest this raises questions as to which of the visualized small nodules might be malignant versus infectious. One of the largest nodules is a 12 by 7 by 11 mm (volume = 500 mm^3) nodule in the right lateral costophrenic angle on image 107 of series 3, with clustered reticulonodular opacities just cephalad to this lesion. Peripheral atelectasis versus mild pleural thickening posteriorly in the left lower lobe. Musculoskeletal: As noted above, the left hemidiaphragm is elevated, and the left anterior mediastinal mass which runs directly in the vicinity  of the left phrenic nerve is implicated as a likely cause. Degenerative arthropathy at the sternoclavicular joints. Thoracic spondylosis. CT ABDOMEN PELVIS FINDINGS Hepatobiliary: Fluid density sharply defined 1.4 by 1.0 cm cyst posteriorly in segment 6 of the liver on image 59 series 2, this previously measured 0.6 cm in diameter back in 2014. Gallbladder unremarkable. No other liver lesion identified. Pancreas: Subtle abnormal hypoenhancement inferiorly in the pancreatic body measuring about 1.2 by 0.7 by 1.3 cm on image 38 of series 5. The pancreatic head is also mildly hypoenhancing. This could be inflammatory but infiltrative pancreatic adenocarcinoma cannot be excluded. This warrants detailed workup with either pancreatic protocol CT or MRI with and without contrast. No dorsal pancreatic duct dilatation identified. Spleen: Unremarkable Adrenals/Urinary Tract: Unremarkable Stomach/Bowel: Prominent stool throughout the colon favors constipation. Sigmoid colon diverticulosis is present and there is an inflamed diverticula and inflammatory findings the junction of the descending and sigmoid colon on image 31 series 5 suggesting mild acute diverticulitis. Tumor in this vicinity with local resulting inflammation is a differential diagnostic consideration given the findings elsewhere in today's examination. Vascular/Lymphatic: Atherosclerosis is present, including aortoiliac atherosclerotic disease. There is atheromatous calcification but only mild stenosis proximally in the superior mesenteric artery, with no findings of occlusion. No pathologic adenopathy observed in the abdomen/pelvis. Reproductive: Unremarkable Other: No supplemental non-categorized findings. Musculoskeletal: As shown on the lumbar spine MRI, there is a pathologic fracture at L1 with diffuse abnormal lysis involving the vertebral body, bilateral pedicles, and bilateral transverse processes at this level with about 65% of vertebral body height and  with a posterior bulging contour of the vertebral body by about 5-6 mm. Further detail on dedicated lumbar spine CT. IMPRESSION: 1. 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 There is also mild left supraclavicular adenopathy. 2. Scattered tree-in-bud reticulonodular opacities in the lungs, right greater than left, characteristic for atypical infectious bronchiolitis. One of the largest nodules is a 12 by 7 by 11 mm nodule in the right lateral costophrenic angle, which could be malignant or infectious. 3. Subtle abnormal hypoenhancement inferiorly in the pancreatic body measuring about 1.2 by 0.7 by 1.3 cm, along with some indistinct hypoenhancement in the pancreatic head. This could be inflammatory but infiltrative pancreatic adenocarcinoma cannot be excluded. This warrants detailed workup with either pancreatic protocol CT or MRI with and without contrast. PET-CT could also be helpful in this case. 4. As shown on lumbar  spine MRI, there is a pathologic (malignant) fracture at L1 with diffuse abnormal lysis involving the vertebral body, bilateral pedicles, and bilateral transverse processes at this level with about 65% of vertebral body height and with a posterior bulging contour of the vertebral body by about 5-6 mm. Further detail on dedicated lumbar spine CT. 5. Mild acute sigmoid colon diverticulitis. Tumor in the vicinity of the local inflammation along the sigmoid colon is not totally excluded but is a less likely differential diagnostic consideration. 6. Aortic atherosclerosis.  Coronary atherosclerosis. Aortic Atherosclerosis (ICD10-I70.0). Electronically Signed   By: Van Clines M.D.   On: 02/10/2022 17:24   MR LUMBAR SPINE WO  CONTRAST  Result Date: 02/10/2022 CLINICAL DATA:  Known compression fracture at L1. worsening pain EXAM: MRI LUMBAR SPINE WITHOUT CONTRAST TECHNIQUE: Multiplanar, multisequence MR imaging of the lumbar spine was performed. No intravenous contrast was administered. COMPARISON:  None Available. FINDINGS: Segmentation:  Standard. Alignment: Trace retrolisthesis of L3 on L4. Grade 1 anterolisthesis of L4 on L5. Vertebrae: There is a acute to subacute compression deformity at the L1 vertebral body level with posterior bulging of the cortex. This results in mild spinal canal narrowing at this level. There is diffuse T1 hypointense signal abnormality in the L1 vertebral body level which involve the bilateral pedicles, as well as the bilateral transverse processes (series 9, image 11). Cortex, particularly at the right transverse process is poorly visualized. Conus medullaris and cauda equina: Conus extends to the L1-L2 level. Conus and cauda equina appear normal. 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 There is atrophy of the paraspinal musculature. No retroperitoneal lymphadenopathy. Bilateral adrenal glands are normal in appearance. Disc levels: T11-T12: No evidence of spinal canal or neural foraminal stenosis. T12-L1: Mild spinal canal stenosis. Mild bilateral neural foraminal stenosis. L1-L2: No spinal canal stenosis. Moderate right neural foraminal stenosis. No left neural foraminal stenosis. L2-L3: Circumferential disc bulge. Mild bilateral facet degenerative change. Mild spinal canal stenosis. No neural foraminal stenosis. L3-L4: Circumferential disc bulge. Mild bilateral facet degenerative change. Moderate left neural foraminal stenosis. No right neural foraminal stenosis. Mild spinal canal narrowing. L4-L5: Grade 1 anterolisthesis. Severe bilateral facet degenerative change. Severe right neural foraminal stenosis. Mild  left neural foraminal stenosis. Mild spinal canal narrowing. L5-S1: No spinal canal or neural foraminal stenosis. IMPRESSION: Acute to subacute compression deformity at the L1 vertebral body level with posterior bulging of the cortex resulting in mild spinal canal stenosis at this level. There is diffuse T1 hypointense signal abnormality in the L1 vertebral body with involvement of the bilateral pedicles, as well as the bilateral transverse processes. This raises concerns for a pathologic fracture. Recommend further evaluation with CT of the lumbar spine and correlate with history of malignancy. Electronically Signed   By: Marin Roberts M.D.   On: 02/10/2022 15:57    Labs on Admission: I have personally reviewed following labs  CBC: Recent Labs  Lab 02/10/22 1148  WBC 9.7  NEUTROABS 8.3*  HGB 11.5*  HCT 38.4  MCV 79.2*  PLT 505   Basic Metabolic Panel: Recent Labs  Lab 02/10/22 1148  NA 135  K 4.4  CL 101  CO2 28  GLUCOSE 140*  BUN 17  CREATININE 0.74  CALCIUM 9.7   GFR: CrCl cannot be calculated (Unknown ideal weight.).  Urine analysis:    Component Value Date/Time   COLORURINE YELLOW (A) 02/10/2022 1148   APPEARANCEUR HAZY (  A) 02/10/2022 1148   LABSPEC 1.026 02/10/2022 1148   PHURINE 5.0 02/10/2022 1148   GLUCOSEU NEGATIVE 02/10/2022 1148   HGBUR NEGATIVE 02/10/2022 1148   BILIRUBINUR NEGATIVE 02/10/2022 1148   KETONESUR 20 (A) 02/10/2022 1148   PROTEINUR NEGATIVE 02/10/2022 1148   NITRITE POSITIVE (A) 02/10/2022 1148   LEUKOCYTESUR SMALL (A) 02/10/2022 1148   Dr. Tobie Poet [facility]  If 7PM-7AM, please contact overnight-coverage provider If 7AM-7PM, please contact day coverage provider [email or web address]  02/10/2022, 7:26 PM

## 2022-02-10 NOTE — Hospital Course (Addendum)
[name] is a 71 year old female with history of hypertension, hyperlipidemia, non-insulin-dependent diabetes mellitus, depression, anxiety, who presents emergency department for chief concerns of low back pain not relieved with home pain medications. 11/01: in ED, afebrile, tachycardic 110 to 130 bpm, hypotensive 157/91, SPO2 100% on RA.  Unremarkable BMP, mild anemia Hgb 11.5 and MCV 79.  UA positive leukocytes, nitrate, with 11-20 WBC/hpf. Imaging: MRI lumbar spine: Acute/subacute compression deformity of L1, mild spinal canal stenosis due to posterior bulging cortex.  Diffuse T1 hypointense abnormality in the L1 vertebral body/pedicle/TP, raising concerns for pathologic fracture.  CT lumbar spine demonstrated tumor involvement of bilateral pedicles, right greater than left TP, right lamina with bony expansion at this level contributing to borderline impingement at T12/L1.  CT chest/abdomen/pelvis demonstrated 3.1 cm centrally necrotic mediastinal mass left prevascular region, scattered reticulonodular opacities in the lungs concerning for atypical infectious bronchiolitis, larger nodule in the right lateral costophrenic angle malignant/infectious, inflammatory pancreatic changes versus infiltrative pancreatic adenocarcinoma, pathologic lumbar spine fracture as noted above, mild acute sigmoid colon diverticulitis and tumor not excluded in this area. ED treatment: Keflex 500 mg p.o. one-time dose, fentanyl 50 mcg one-time dose, morphine one-time dose, Dilaudid 0.5 mg IV one-time dose, ondansetron. 11/02: US guided lymph node biopsy. Pain control has been difficult.  11/03: Neurosurgery planning for intervention on Monday (today is Friday). Tapering down on dilaudid.  11/04: stable. Lymph node pathology still pending as expected. Pain controlled today.     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 Janese Banks following Pain control: Hydrocodone-APAP 5-325 mg two tabs q6h, ibuprofen 600 mg q6h, dilaudid IV 1 mg q4h prn severe pain, dilaudid 0.5 mg q4h prn moderate pain. Advised pt and family will plan weaning down as able to avoid dependence/complications. Bowel regimen ordered.  Per Onc: IR 11/02 biopsy mediastinal mass planning kyphoplasty w/ neurosurgery possibly Monday (today is Saturday)    Hypertension Losartan 100 mg daily resumed, carvedilol 3.125 mg p.o. twice daily resumed Hydralazine 5 mg IV every 8 hours.  For SBP greater than 180, 4 days ordered   Hyperlipidemia Simvastatin 40 mg daily resumed   Diabetes mellitus type 2, controlled ([facility]) Insulin SSI with at bedtime coverage ordered Pt has requested regular diet, I have obliged, will adjust insulin as needed   E Coli UTI D/c IV abx, started cefdinir today to finish course    DVT prophylaxis: heparin Pertinent IV fluids/nutrition: tolerating po, no continuous IV fluids  Central lines / invasive devices: none   Code Status: FULL CODE Family Communication: daughter at bedside on rounds  Disposition: inpatient  TOC needs: none at this time Barriers to discharge / significant pending items: pending biopsy resutls, procedure on Monday, and requiring IV pain control  anticipate will need rehab/SNF and will be here through  the weekend

## 2022-02-10 NOTE — ED Provider Notes (Signed)
-----------------------------------------   4:24 PM on 02/10/2022 ----------------------------------------- Patient care assumed from nurse practitioner [facility].  I have seen and evaluated the patient patient continues to have significant lower back pain.  Patient has received morphine fentanyl and now Dilaudid for pain control.  In speaking to the patient in depth she states her pain has been ongoing since possibly July or August she had a fall in September which acutely exacerbated the pain however she states it was not near to this level until approximately 2 weeks ago.  Denies any recent trauma or recent falls to exacerbate her pain.  Patient's lab work is largely [place] besides a possible urinary tract infection which is likely unrelated to today's presentation.  Patient states the pain is much worse with any attempted movement highly suspect the pain to be due to her known L1 compression fracture.  An MRI has resulted showing compression fracture with small amount of bulging into the central canal.  Concern for possible pathologic fracture.  We will obtain CT imaging of the chest abdomen pelvis with T-spine and L-spine CTs.  Patient denies any known cancer or known cancer history.  We will discuss the MRI findings with neurosurgery as well.  Given the patient's significant pain and multiple IV pain medications anticipate the patient will require admission to the hospitalist service for ongoing work-up and management as well as pain control.  Patient and daughter agreeable to this plan as they do not feel the patient could control her pain or care for herself at home in her current state.  Patient reassuringly denies any numbness tingling or pain radiating into either leg.  Denies any incontinence.  CT scans are resulted confirming pathologic fracture.  Concern for mediastinal mass and possibly pancreatic mass.  Concern for possible lymphadenopathy within the chest which could indicate  metastatic disease as well.  No other bony metastasis noted on CT imaging.  Given the patient's continued worsening pain and now concern for oncologic process necessitating further work-up we will admit the patient to the hospitalist service for further treatment.  I spoke to Dr. Cari Caraway of neurosurgery who does recommended TLSO brace for the patient and he will follow-up with the patient but no other acute treatments needed from his standpoint.  I have updated the patient and family member regarding today's findings and the need for further work-up and staging.  Given the tree-in-bud appearance of the chest patient is already receiving Keflex for presumed UTI we will add Zithromax to cover for any lung finding although patient denies any cough or congestion or fever.   Harvest Dark, MD 02/10/22 1758

## 2022-02-10 NOTE — Assessment & Plan Note (Addendum)
L1 pathologic fracture with diffuse abnormal lysis involving vertebral body and bilateral pedicles - Neurosurgeon does not think from an neurosurgical perspective would recommend fitting patient with a TLSO brace - Oncology, Dr. 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 - Morphine 4 mg IV every 4 hours as needed for moderate pain, 4 doses ordered; Dilaudid 0.5 mg IV every 3 hours as needed for severe pain, 4 doses ordered; Dilaudid 1 mg IV every 3 hours as needed for pain not controlled with IV morphine or Dilaudid, 4 doses ordered - Patient reports that the morphine did not do anything for her and requested that I discontinue that medication - Patient states that she would rather have acetaminophen and or IV Dilaudid - AM team to reevaluate patient at bedside for continued opioid medication requirements

## 2022-02-10 NOTE — Assessment & Plan Note (Signed)
-   Simvastatin 40 mg daily resumed 

## 2022-02-10 NOTE — Plan of Care (Signed)
  Problem: Health Behavior/Discharge Planning: Goal: Ability to manage health-related needs will improve Outcome: Progressing   Problem: Metabolic: Goal: Ability to maintain appropriate glucose levels will improve Outcome: Progressing

## 2022-02-10 NOTE — Consult Note (Signed)
Hematology/Oncology Consult note [facility] Telephone:([phone] [phone]  Patient Care Team: Lynnell Jude, MD as PCP - General (Family Medicine)   Name of the patient: Amber Glenn  [id number]  1951/03/27    Reason for referral- ***   Referring physician- ***  Date of visit: @TODAY @   History of presenting illness- ***  ECOG PS- ***  Pain scale- ***   Review of systems- ROS  No Known Allergies  Patient Active Problem List   Diagnosis Date Noted   Pathologic fracture 02/10/2022   Multifocal pneumonia    Diarrhea    COVID-19 08/18/2019   Thumb pain 06/26/2015   Medicare annual wellness visit, subsequent 03/14/2015   Arthralgia 09/02/2014   Hot flashes 01/21/2014   Left shoulder pain 09/20/2013   Unspecified constipation 08/30/2012   Routine general medical examination at a health care facility 05/29/2012   Screening for colon cancer 05/29/2012   Hypertension 08/18/2011   Osteoarthritis 03/20/2009   Diabetes mellitus type 2, controlled ([place]) 11/14/2008   BASAL CELL CARCINOMA, FACE 12/06/2006   Hyperlipidemia 12/06/2006   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 Manley Mason   CARPAL TUNNEL RELEASE     right   COLONOSCOPY WITH PROPOFOL N/A 12/03/2015   Procedure: COLONOSCOPY WITH PROPOFOL;  Surgeon: Manya Silvas, MD;  Location: [facility] ENDOSCOPY;  Service: Endoscopy;   Laterality: N/A;   COLONOSCOPY WITH PROPOFOL N/A 02/22/2020   Procedure: COLONOSCOPY WITH PROPOFOL;  Surgeon: Lesly Rubenstein, MD;  Location: [facility] ENDOSCOPY;  Service: Endoscopy;  Laterality: N/A;   ESOPHAGOGASTRODUODENOSCOPY (EGD) WITH PROPOFOL N/A 02/22/2020   Procedure: ESOPHAGOGASTRODUODENOSCOPY (EGD) WITH PROPOFOL;  Surgeon: Lesly Rubenstein, MD;  Location: [facility] ENDOSCOPY;  Service: Endoscopy;  Laterality: N/A;   HAMMER TOE SURGERY Right 05/23/2019   Procedure: HAMMER TOE CORRECTION;  Surgeon: Samara Deist, DPM;  Location: [place];  Service: Podiatry;  Laterality: Right;  Diabetic   OSTECTOMY Right 05/23/2019   Procedure: DOUBLE OSTEOTOMY RIGHT;  Surgeon: Samara Deist, DPM;  Location: [place];  Service: Podiatry;  Laterality: Right;   SKIN CANCER EXCISION      Social History   Socioeconomic History   Marital status: Widowed    Spouse name: Not on file   Number of children: Not on file   Years of education: Not on file   Highest education level: Not on file  Occupational History    Employer: [employer]    Comment: Currently not employed  Tobacco Use   Smoking status: Former    Types: Cigarettes    Quit date: 01/13/2009    Years since quitting: 13.0   Smokeless tobacco: Never  Substance and Sexual Activity   Alcohol use: No   Drug use: No   Sexual activity: Yes    Birth control/protection: Post-menopausal  Other  Topics Concern   Not on file  Social History Narrative   Not on file   Social Determinants of Health   Financial Resource Strain: Not on file  Food Insecurity: Not on file  Transportation Needs: Not on file  Physical Activity: Not on file  Stress: Not on file  Social Connections: Not on file  Intimate Partner Violence: Not on file     Family History  Problem Relation Age of Onset   Heart disease Mother    Heart disease Father    Heart disease Maternal Grandmother    Heart disease Maternal Grandfather    Breast cancer  Sister 70     Current Facility-Administered Medications:    acetaminophen (TYLENOL) tablet 650 mg, 650 mg, Oral, Q6H PRN **OR** acetaminophen (TYLENOL) suppository 650 mg, 650 mg, Rectal, Q6H PRN, Cox, Amy N, DO   heparin injection 5,000 Units, 5,000 Units, Subcutaneous, Q8H, Cox, Amy N, DO   HYDROmorphone (DILAUDID) injection 0.5 mg, 0.5 mg, Intravenous, Q3H PRN, Cox, Amy N, DO   HYDROmorphone (DILAUDID) injection 1 mg, 1 mg, Intravenous, Q3H PRN, Cox, Amy N, DO   [START ON 02/11/2022] insulin aspart (novoLOG) injection 0-15 Units, 0-15 Units, Subcutaneous, TID WC, Cox, Amy N, DO   insulin aspart (novoLOG) injection 0-5 Units, 0-5 Units, Subcutaneous, QHS, Cox, Amy N, DO   morphine (PF) 4 MG/ML injection 4 mg, 4 mg, Intravenous, Q4H PRN, Cox, Amy N, DO   ondansetron (ZOFRAN) tablet 4 mg, 4 mg, Oral, Q6H PRN **OR** ondansetron (ZOFRAN) injection 4 mg, 4 mg, Intravenous, Q6H PRN, Cox, Amy N, DO   senna-docusate (Senokot-S) tablet 1 tablet, 1 tablet, Oral, QHS PRN, Cox, Amy N, DO  Current Outpatient Medications:    Ascorbic Acid (VITAMIN C) 1000 MG tablet, Take 1,000 mg by mouth daily.  , Disp: , Rfl:    aspirin 81 MG tablet, Take 81 mg by mouth daily.   (Patient not taking: Reported on 02/22/2020), Disp: , Rfl:    Blood Glucose Monitoring Suppl (TRUE METRIX AIR GLUCOSE METER) DEVI, 1 each by Does not apply route daily. Check sugar once daily Dx E11.9, Disp: 1 Device, Rfl: 0   carvedilol (COREG) 6.25 MG tablet, TAKE 1 TABLET TWICE DAILY WITH MEALS, Disp: 180 tablet, Rfl: 3   docusate sodium (COLACE) 100 MG capsule, Take 100 mg by mouth daily. , Disp: , Rfl:    furosemide (LASIX) 40 MG tablet, Take 1 tablet (40 mg total) by mouth daily., Disp: 90 tablet, Rfl: 3   glucose blood (TRUE METRIX BLOOD GLUCOSE TEST) test strip, Check sugar once daily Dx E11.9, Disp: 100 each, Rfl: 3   guaiFENesin-dextromethorphan (ROBITUSSIN DM) 100-10 MG/5ML syrup, Take 10 mLs by mouth every 4 (four) hours as needed for  cough., Disp: 118 mL, Rfl: 0   loratadine (ALLERGY) 10 MG tablet, Take 10 mg by mouth daily., Disp: , Rfl:    losartan (COZAAR) 25 MG tablet, TAKE 1 TABLET EVERY DAY, Disp: 90 tablet, Rfl: 3   meclizine (ANTIVERT) 25 MG tablet, Take 1 tablet (25 mg total) by mouth 3 (three) times daily as needed for dizziness., Disp: 15 tablet, Rfl: 0   metFORMIN (GLUCOPHAGE) 500 MG tablet, TAKE 1 TABLET TWICE DAILY WITH A MEAL, Disp: 180 tablet, Rfl: 1   Multiple Vitamin (MULTIVITAMIN) capsule, Take 1 capsule by mouth daily.  , Disp: , Rfl:    omeprazole (PRILOSEC) 20 MG capsule, Take 1 capsule (20 mg total) by mouth daily., Disp: 90 capsule, Rfl: 3  phenol (CHLORASEPTIC) 1.4 % LIQD, Use as directed 1 spray in the mouth or throat as needed for throat irritation / pain., Disp: 236 mL, Rfl: 0   prednisoLONE acetate (PRED MILD) 0.12 % ophthalmic suspension, Place 1 drop into the right eye daily., Disp: , Rfl:    predniSONE (STERAPRED UNI-PAK 21 TAB) 10 MG (21) TBPK tablet, Start 60 mg p.o. daily, taper 10 mg daily until done (Patient not taking: Reported on 02/22/2020), Disp: 21 tablet, Rfl: 0   simvastatin (ZOCOR) 40 MG tablet, TAKE 1 TABLET EVERY DAY  AT  6:00 PM, Disp: 90 tablet, Rfl: 3   TRUEPLUS LANCETS 28G MISC, Check sugar once daily Dx E11.9, Disp: 100 each, Rfl: 3   zinc sulfate 220 (50 Zn) MG capsule, Take 1 capsule (220 mg total) by mouth 2 (two) times daily. (Patient not taking: Reported on 02/22/2020), Disp: 60 capsule, Rfl: 0   Physical exam:  Vitals:   02/10/22 1300 02/10/22 1315 02/10/22 1458 02/10/22 1553  BP: (!) 154/72  (!) 152/74 (!) 152/78  Pulse: (!) 112  (!) 120 (!) 124  Resp: 16 20 13 14   Temp:    98.1 F (36.7 C)  TempSrc:    Oral  SpO2: 93%  100% 97%  Weight:       Physical Exam        Latest Ref Rng & Units 02/10/2022   11:48 AM  CMP  Glucose 70 - 99 mg/dL 140   BUN 8 - 23 mg/dL 17   Creatinine 0.44 - 1.00 mg/dL 0.74   Sodium 135 - 145 mmol/L 135   Potassium 3.5 - 5.1  mmol/L 4.4   Chloride 98 - 111 mmol/L 101   CO2 22 - 32 mmol/L 28   Calcium 8.9 - 10.3 mg/dL 9.7       Latest Ref Rng & Units 02/10/2022   11:48 AM  CBC  WBC 4.0 - 10.5 K/uL 9.7   Hemoglobin 12.0 - 15.0 g/dL 11.5   Hematocrit 36.0 - 46.0 % 38.4   Platelets 150 - 400 K/uL 265     @IMAGES @  CT T-SPINE NO CHARGE  Result Date: 02/10/2022 CLINICAL DATA:  Pathologic fracture at L1 EXAM: CT THORACIC AND LUMBAR SPINE WITHOUT CONTRAST TECHNIQUE: Multidetector CT imaging of the thoracic and lumbar spine was performed without contrast. Multiplanar CT image reconstructions were also generated. RADIATION DOSE REDUCTION: This exam was performed according to the departmental dose-optimization program which includes automated exposure control, adjustment of the mA and/or kV according to patient size and/or use of iterative reconstruction technique. COMPARISON:  MRI lumbar spine 02/11/2012 FINDINGS: CT THORACIC SPINE FINDINGS Alignment: No vertebral subluxation is observed. Thoracic kyphosis noted. Vertebrae: No thoracic spine fracture or acute bony finding. There is multilevel bridging spurring anterior to the thoracic spine at the T2 through T10 levels. Paraspinal and other soft tissues: Please see dedicated CT chest report. Disc levels: No substantial thoracic spine impingement is noted. There is left paracentral posterior intervertebral spurring at the T8-9 level but not causing central narrowing of the thecal sac. CT LUMBAR SPINE FINDINGS Segmentation: The lowest lumbar type non-rib-bearing vertebra is labeled as L5. Alignment: 5 mm of grade 1 degenerative anterolisthesis at L4-5. Vertebrae: Pathologic fracture at L1 with about 65% loss of vertebral body height, with the posterior contour of the vertebral body bulging back 6 mm in a manner highly suspicious for pathologic underlying tumor. The abnormal associated lucency in the vertebral body extends into both pedicles and into the  transverse processes (right  greater than left, with expansion of the pedicles and right transverse process. There is some mild extension into the right lamina on image 25 series 4. Mild degenerative endplate sclerosis at the L3-4 and L4-5 levels with loss of intervertebral disc height. No other lytic lesions are identified in the lumbar spine. Paraspinal and other soft tissues: Please see dedicated CT abdomen report. Disc levels: T12-L1: Borderline bilateral foraminal stenosis due to tumor expansion of the bilateral pedicles. Cannot exclude right subarticular lateral recess stenosis given the tumor expansion in this vicinity borderline central narrowing of the thecal sac due to posterior bony retropulsion/bulging. L1-2: No impingement. L2-3: No impingement.  Diffuse disc bulge. L3-4: No impingement.  Right eccentric disc bulge. L4-5: Moderate to prominent right and mild left foraminal stenosis and borderline central narrowing of the thecal sac due to subluxation, disc uncovering, and facet arthropathy. L5-S1: No impingement.  Central disc protrusion. IMPRESSION: 1. L1 pathologic fracture with 65% loss of vertebral body height and posterior bulging of the vertebral body by 0.6 cm. Tumor involvement of the bilateral pedicles, right greater than left transverse process, and right lamina. Bony expansion at this level contributes to borderline impingement at T12-L1. No other osseous metastatic disease in the thoracolumbar spine identified. 2. Moderate to prominent impingement at L4-5 due to subluxation with disc uncovering and facet arthropathy. Electronically Signed   By: Van Clines M.D.   On: 02/10/2022 17:37   CT L-SPINE NO CHARGE  Result Date: 02/10/2022 CLINICAL DATA:  Pathologic fracture at L1 EXAM: CT THORACIC AND LUMBAR SPINE WITHOUT CONTRAST TECHNIQUE: Multidetector CT imaging of the thoracic and lumbar spine was performed without contrast. Multiplanar CT image reconstructions were also generated. RADIATION DOSE REDUCTION: This  exam was performed according to the departmental dose-optimization program which includes automated exposure control, adjustment of the mA and/or kV according to patient size and/or use of iterative reconstruction technique. COMPARISON:  MRI lumbar spine 02/11/2012 FINDINGS: CT THORACIC SPINE FINDINGS Alignment: No vertebral subluxation is observed. Thoracic kyphosis noted. Vertebrae: No thoracic spine fracture or acute bony finding. There is multilevel bridging spurring anterior to the thoracic spine at the T2 through T10 levels. Paraspinal and other soft tissues: Please see dedicated CT chest report. Disc levels: No substantial thoracic spine impingement is noted. There is left paracentral posterior intervertebral spurring at the T8-9 level but not causing central narrowing of the thecal sac. CT LUMBAR SPINE FINDINGS Segmentation: The lowest lumbar type non-rib-bearing vertebra is labeled as L5. Alignment: 5 mm of grade 1 degenerative anterolisthesis at L4-5. Vertebrae: Pathologic fracture at L1 with about 65% loss of vertebral body height, with the posterior contour of the vertebral body bulging back 6 mm in a manner highly suspicious for pathologic underlying tumor. The abnormal associated lucency in the vertebral body extends into both pedicles and into the transverse processes (right greater than left, with expansion of the pedicles and right transverse process. There is some mild extension into the right lamina on image 25 series 4. Mild degenerative endplate sclerosis at the L3-4 and L4-5 levels with loss of intervertebral disc height. No other lytic lesions are identified in the lumbar spine. Paraspinal and other soft tissues: Please see dedicated CT abdomen report. Disc levels: T12-L1: Borderline bilateral foraminal stenosis due to tumor expansion of the bilateral pedicles. Cannot exclude right subarticular lateral recess stenosis given the tumor expansion in this vicinity borderline central narrowing of  the thecal sac due to posterior bony retropulsion/bulging. L1-2: No impingement. L2-3: No  impingement.  Diffuse disc bulge. L3-4: No impingement.  Right eccentric disc bulge. L4-5: Moderate to prominent right and mild left foraminal stenosis and borderline central narrowing of the thecal sac due to subluxation, disc uncovering, and facet arthropathy. L5-S1: No impingement.  Central disc protrusion. IMPRESSION: 1. L1 pathologic fracture with 65% loss of vertebral body height and posterior bulging of the vertebral body by 0.6 cm. Tumor involvement of the bilateral pedicles, right greater than left transverse process, and right lamina. Bony expansion at this level contributes to borderline impingement at T12-L1. No other osseous metastatic disease in the thoracolumbar spine identified. 2. Moderate to prominent impingement at L4-5 due to subluxation with disc uncovering and facet arthropathy. Electronically Signed   By: Van Clines M.D.   On: 02/10/2022 17:37   CT CHEST ABDOMEN PELVIS W CONTRAST  Result Date: 02/10/2022 CLINICAL DATA:  Occult malignancy, staging workup.  Severe pain. * Tracking Code: BO * EXAM: CT CHEST, ABDOMEN, AND PELVIS WITH CONTRAST TECHNIQUE: Multidetector CT imaging of the chest, abdomen and pelvis was performed following the standard protocol during bolus administration of intravenous contrast. RADIATION DOSE REDUCTION: This exam was performed according to the departmental dose-optimization program which includes automated exposure control, adjustment of the mA and/or kV according to patient size and/or use of iterative reconstruction technique. CONTRAST:  126mL OMNIPAQUE IOHEXOL 300 MG/ML  SOLN COMPARISON:  Lumbar MRI 02/10/2022 and CT abdomen pelvis 01/02/2013 FINDINGS: CT CHEST FINDINGS Cardiovascular: Coronary, aortic arch, and branch vessel atherosclerotic vascular disease. Mediastinum/Nodes: Centrally necrotic 3.1 by 2.4 by 2.9 cm anterior mediastinal mass in the left  prevascular region on image 17 series 2, with mild mass effect on the brachiocephalic vein as shown on image 16 series 2. This is in the immediate vicinity of the phrenic nerve, and the elevation of the left hemidiaphragm is new compared to 08/11/2019 chest radiograph, raising suspicion for phrenic nerve impingement leading to diaphragmatic dysfunction. Left supraclavicular node, 1.0 cm in short axis on image 9 series 2. Right eccentric subcarinal node 1.0 cm in short axis on image 28 series 2. Lungs/Pleura: Scattered scarring and/or atelectasis in the left lower lobe and lingula. This is particularly notable anteriorly in the left lower lobe. Bronchiectasis and volume loss medially in the right middle lobe for example on image 98 series 3. Scattered tree-in-bud reticulonodular opacities are present in the lungs, right greater than left, characteristic for atypical infectious bronchiolitis. In light of the pathologic adenopathy in the chest this raises questions as to which of the visualized small nodules might be malignant versus infectious. One of the largest nodules is a 12 by 7 by 11 mm (volume = 500 mm^3) nodule in the right lateral costophrenic angle on image 107 of series 3, with clustered reticulonodular opacities just cephalad to this lesion. Peripheral atelectasis versus mild pleural thickening posteriorly in the left lower lobe. Musculoskeletal: As noted above, the left hemidiaphragm is elevated, and the left anterior mediastinal mass which runs directly in the vicinity of the left phrenic nerve is implicated as a likely cause. Degenerative arthropathy at the sternoclavicular joints. Thoracic spondylosis. CT ABDOMEN PELVIS FINDINGS Hepatobiliary: Fluid density sharply defined 1.4 by 1.0 cm cyst posteriorly in segment 6 of the liver on image 59 series 2, this previously measured 0.6 cm in diameter back in 2014. Gallbladder unremarkable. No other liver lesion identified. Pancreas: Subtle abnormal  hypoenhancement inferiorly in the pancreatic body measuring about 1.2 by 0.7 by 1.3 cm on image 38 of series 5. The pancreatic head  is also mildly hypoenhancing. This could be inflammatory but infiltrative pancreatic adenocarcinoma cannot be excluded. This warrants detailed workup with either pancreatic protocol CT or MRI with and without contrast. No dorsal pancreatic duct dilatation identified. Spleen: Unremarkable Adrenals/Urinary Tract: Unremarkable Stomach/Bowel: Prominent stool throughout the colon favors constipation. Sigmoid colon diverticulosis is present and there is an inflamed diverticula and inflammatory findings the junction of the descending and sigmoid colon on image 31 series 5 suggesting mild acute diverticulitis. Tumor in this vicinity with local resulting inflammation is a differential diagnostic consideration given the findings elsewhere in today's examination. Vascular/Lymphatic: Atherosclerosis is present, including aortoiliac atherosclerotic disease. There is atheromatous calcification but only mild stenosis proximally in the superior mesenteric artery, with no findings of occlusion. No pathologic adenopathy observed in the abdomen/pelvis. Reproductive: Unremarkable Other: No supplemental non-categorized findings. Musculoskeletal: As shown on the lumbar spine MRI, there is a pathologic fracture at L1 with diffuse abnormal lysis involving the vertebral body, bilateral pedicles, and bilateral transverse processes at this level with about 65% of vertebral body height and with a posterior bulging contour of the vertebral body by about 5-6 mm. Further detail on dedicated lumbar spine CT. IMPRESSION: 1. 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 This is in the immediate vicinity of the phrenic nerve, with new elevation of the left hemidiaphragm compatible with phrenic nerve impingement and diaphragmatic  dysfunction. There is also mild left supraclavicular adenopathy. 2. Scattered tree-in-bud reticulonodular opacities in the lungs, right greater than left, characteristic for atypical infectious bronchiolitis. One of the largest nodules is a 12 by 7 by 11 mm nodule in the right lateral costophrenic angle, which could be malignant or infectious. 3. Subtle abnormal hypoenhancement inferiorly in the pancreatic body measuring about 1.2 by 0.7 by 1.3 cm, along with some indistinct hypoenhancement in the pancreatic head. This could be inflammatory but infiltrative pancreatic adenocarcinoma cannot be excluded. This warrants detailed workup with either pancreatic protocol CT or MRI with and without contrast. PET-CT could also be helpful in this case. 4. As shown on lumbar spine MRI, there is a pathologic (malignant) fracture at L1 with diffuse abnormal lysis involving the vertebral body, bilateral pedicles, and bilateral transverse processes at this level with about 65% of vertebral body height and with a posterior bulging contour of the vertebral body by about 5-6 mm. Further detail on dedicated lumbar spine CT. 5. Mild acute sigmoid colon diverticulitis. Tumor in the vicinity of the local inflammation along the sigmoid colon is not totally excluded but is a less likely differential diagnostic consideration. 6. Aortic atherosclerosis.  Coronary atherosclerosis. Aortic Atherosclerosis (ICD10-I70.0). Electronically Signed   By: Van Clines M.D.   On: 02/10/2022 17:24   MR LUMBAR SPINE WO CONTRAST  Result Date: 02/10/2022 CLINICAL DATA:  Known compression fracture at L1. worsening pain EXAM: MRI LUMBAR SPINE WITHOUT CONTRAST TECHNIQUE: Multiplanar, multisequence MR imaging of the lumbar spine was performed. No intravenous contrast was administered. COMPARISON:  None Available. FINDINGS: Segmentation:  Standard. Alignment: Trace retrolisthesis of L3 on L4. Grade 1 anterolisthesis of L4 on L5. Vertebrae: There is a  acute to subacute compression deformity at the L1 vertebral body level with posterior bulging of the cortex. This results in mild spinal canal narrowing at this level. There is diffuse T1 hypointense signal abnormality in the L1 vertebral body level which involve the bilateral pedicles, as well as the bilateral transverse processes (series 9, image 11). Cortex, particularly  at the right transverse process is poorly visualized. Conus medullaris and cauda equina: Conus extends to the L1-L2 level. Conus and cauda equina appear normal. 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 There is atrophy of the paraspinal musculature. No retroperitoneal lymphadenopathy. Bilateral adrenal glands are normal in appearance. Disc levels: T11-T12: No evidence of spinal canal or neural foraminal stenosis. T12-L1: Mild spinal canal stenosis. Mild bilateral neural foraminal stenosis. L1-L2: No spinal canal stenosis. Moderate right neural foraminal stenosis. No left neural foraminal stenosis. L2-L3: Circumferential disc bulge. Mild bilateral facet degenerative change. Mild spinal canal stenosis. No neural foraminal stenosis. L3-L4: Circumferential disc bulge. Mild bilateral facet degenerative change. Moderate left neural foraminal stenosis. No right neural foraminal stenosis. Mild spinal canal narrowing. L4-L5: Grade 1 anterolisthesis. Severe bilateral facet degenerative change. Severe right neural foraminal stenosis. Mild left neural foraminal stenosis. Mild spinal canal narrowing. L5-S1: No spinal canal or neural foraminal stenosis. IMPRESSION: Acute to subacute compression deformity at the L1 vertebral body level with posterior bulging of the cortex resulting in mild spinal canal stenosis at this level. There is diffuse T1 hypointense signal abnormality in the L1 vertebral body with involvement of the bilateral pedicles, as well as the bilateral  transverse processes. This raises concerns for a pathologic fracture. Recommend further evaluation with CT of the lumbar spine and correlate with history of malignancy. Electronically Signed   By: Marin Roberts M.D.   On: 02/10/2022 15:57    Assessment and plan- Patient is a 71 y.o. female ***   Thank you for this kind referral and the opportunity to participate in the care of this  Patient   Visit Diagnosis 1. Acute low back pain, unspecified back pain laterality, unspecified whether sciatica present   2. Pathological fracture of lumbar vertebra, initial encounter   3. Mediastinal mass     Dr. Randa Evens, MD, MPH [facility] [phone] 02/10/2022

## 2022-02-10 NOTE — ED Triage Notes (Signed)
Pt arrives via POV. Pt fell at work and now has a L1 fx. Pt advises that she is suppose to have a MRI tomorrow at [facility] but cant stand the pain anymore.

## 2022-02-11 ENCOUNTER — Observation Stay: Payer: Medicare HMO

## 2022-02-11 ENCOUNTER — Other Ambulatory Visit: Payer: Self-pay

## 2022-02-11 ENCOUNTER — Encounter: Payer: Self-pay | Admitting: Internal Medicine

## 2022-02-11 DIAGNOSIS — M8448XA Pathological fracture, other site, initial encounter for fracture: Secondary | ICD-10-CM | POA: Diagnosis present

## 2022-02-11 DIAGNOSIS — R590 Localized enlarged lymph nodes: Secondary | ICD-10-CM

## 2022-02-11 DIAGNOSIS — F05 Delirium due to known physiological condition: Secondary | ICD-10-CM | POA: Diagnosis not present

## 2022-02-11 DIAGNOSIS — B9620 Unspecified Escherichia coli [E. coli] as the cause of diseases classified elsewhere: Secondary | ICD-10-CM | POA: Diagnosis present

## 2022-02-11 DIAGNOSIS — Z23 Encounter for immunization: Secondary | ICD-10-CM | POA: Diagnosis not present

## 2022-02-11 DIAGNOSIS — C801 Malignant (primary) neoplasm, unspecified: Secondary | ICD-10-CM | POA: Diagnosis not present

## 2022-02-11 DIAGNOSIS — K219 Gastro-esophageal reflux disease without esophagitis: Secondary | ICD-10-CM | POA: Diagnosis present

## 2022-02-11 DIAGNOSIS — M545 Low back pain, unspecified: Secondary | ICD-10-CM | POA: Diagnosis not present

## 2022-02-11 DIAGNOSIS — J309 Allergic rhinitis, unspecified: Secondary | ICD-10-CM | POA: Diagnosis present

## 2022-02-11 DIAGNOSIS — K5732 Diverticulitis of large intestine without perforation or abscess without bleeding: Secondary | ICD-10-CM | POA: Diagnosis present

## 2022-02-11 DIAGNOSIS — M532X6 Spinal instabilities, lumbar region: Secondary | ICD-10-CM

## 2022-02-11 DIAGNOSIS — F32A Depression, unspecified: Secondary | ICD-10-CM | POA: Diagnosis present

## 2022-02-11 DIAGNOSIS — F419 Anxiety disorder, unspecified: Secondary | ICD-10-CM | POA: Diagnosis present

## 2022-02-11 DIAGNOSIS — J9859 Other diseases of mediastinum, not elsewhere classified: Secondary | ICD-10-CM

## 2022-02-11 DIAGNOSIS — N3000 Acute cystitis without hematuria: Secondary | ICD-10-CM | POA: Diagnosis present

## 2022-02-11 DIAGNOSIS — C7951 Secondary malignant neoplasm of bone: Secondary | ICD-10-CM | POA: Diagnosis present

## 2022-02-11 DIAGNOSIS — Z7189 Other specified counseling: Secondary | ICD-10-CM

## 2022-02-11 DIAGNOSIS — R599 Enlarged lymph nodes, unspecified: Secondary | ICD-10-CM | POA: Diagnosis present

## 2022-02-11 DIAGNOSIS — Z515 Encounter for palliative care: Secondary | ICD-10-CM | POA: Diagnosis not present

## 2022-02-11 DIAGNOSIS — Z8616 Personal history of COVID-19: Secondary | ICD-10-CM | POA: Diagnosis not present

## 2022-02-11 DIAGNOSIS — M8458XA Pathological fracture in neoplastic disease, other specified site, initial encounter for fracture: Secondary | ICD-10-CM | POA: Diagnosis not present

## 2022-02-11 DIAGNOSIS — M48061 Spinal stenosis, lumbar region without neurogenic claudication: Secondary | ICD-10-CM | POA: Diagnosis present

## 2022-02-11 DIAGNOSIS — G893 Neoplasm related pain (acute) (chronic): Secondary | ICD-10-CM

## 2022-02-11 DIAGNOSIS — E119 Type 2 diabetes mellitus without complications: Secondary | ICD-10-CM | POA: Diagnosis present

## 2022-02-11 DIAGNOSIS — C3490 Malignant neoplasm of unspecified part of unspecified bronchus or lung: Secondary | ICD-10-CM | POA: Diagnosis present

## 2022-02-11 DIAGNOSIS — I700 Atherosclerosis of aorta: Secondary | ICD-10-CM | POA: Diagnosis present

## 2022-02-11 DIAGNOSIS — W1830XA Fall on same level, unspecified, initial encounter: Secondary | ICD-10-CM | POA: Diagnosis present

## 2022-02-11 DIAGNOSIS — K5900 Constipation, unspecified: Secondary | ICD-10-CM | POA: Diagnosis present

## 2022-02-11 DIAGNOSIS — I2510 Atherosclerotic heart disease of native coronary artery without angina pectoris: Secondary | ICD-10-CM | POA: Diagnosis present

## 2022-02-11 DIAGNOSIS — E785 Hyperlipidemia, unspecified: Secondary | ICD-10-CM | POA: Diagnosis present

## 2022-02-11 DIAGNOSIS — I10 Essential (primary) hypertension: Secondary | ICD-10-CM | POA: Diagnosis present

## 2022-02-11 DIAGNOSIS — Z87891 Personal history of nicotine dependence: Secondary | ICD-10-CM | POA: Diagnosis not present

## 2022-02-11 LAB — CBC
HCT: 35.5 % — ABNORMAL LOW (ref 36.0–46.0)
Hemoglobin: 10.5 g/dL — ABNORMAL LOW (ref 12.0–15.0)
MCH: 23.7 pg — ABNORMAL LOW (ref 26.0–34.0)
MCHC: 29.6 g/dL — ABNORMAL LOW (ref 30.0–36.0)
MCV: 80.1 fL (ref 80.0–100.0)
Platelets: 273 10*3/uL (ref 150–400)
RBC: 4.43 MIL/uL (ref 3.87–5.11)
RDW: 15.8 % — ABNORMAL HIGH (ref 11.5–15.5)
WBC: 6.9 10*3/uL (ref 4.0–10.5)
nRBC: 0 % (ref 0.0–0.2)

## 2022-02-11 LAB — GLUCOSE, CAPILLARY
Glucose-Capillary: 113 mg/dL — ABNORMAL HIGH (ref 70–99)
Glucose-Capillary: 148 mg/dL — ABNORMAL HIGH (ref 70–99)
Glucose-Capillary: 116 mg/dL — ABNORMAL HIGH (ref 70–99)
Glucose-Capillary: 119 mg/dL — ABNORMAL HIGH (ref 70–99)
Glucose-Capillary: 139 mg/dL — ABNORMAL HIGH (ref 70–99)

## 2022-02-11 LAB — BASIC METABOLIC PANEL
Anion gap: 8 (ref 5–15)
BUN: 13 mg/dL (ref 8–23)
CO2: 27 mmol/L (ref 22–32)
Calcium: 9.6 mg/dL (ref 8.9–10.3)
Chloride: 102 mmol/L (ref 98–111)
Creatinine, Ser: 0.6 mg/dL (ref 0.44–1.00)
GFR, Estimated: >60 mL/min (ref >60)
Glucose, Bld: 119 mg/dL — ABNORMAL HIGH (ref 70–99)
Potassium: 4.6 mmol/L (ref 3.5–5.1)
Sodium: 137 mmol/L (ref 135–145)

## 2022-02-11 LAB — HEMOGLOBIN A1C
Hgb A1c MFr Bld: 5.9 % — ABNORMAL HIGH (ref 4.8–5.6)
Mean Plasma Glucose: 122.63 mg/dL

## 2022-02-11 MED ORDER — MIDAZOLAM HCL 2 MG/2ML IJ SOLN
INTRAMUSCULAR | Status: AC
  Filled 2022-02-11: qty 2

## 2022-02-11 MED ORDER — HYDROMORPHONE HCL 1 MG/ML IJ SOLN
0.5000 mg | INTRAMUSCULAR | Status: DC | PRN
  Administered 2022-02-11: 0.5 mg via INTRAVENOUS
  Filled 2022-02-11: qty 1

## 2022-02-11 MED ORDER — LIDOCAINE HCL (PF) 1 % IJ SOLN
8.0000 mL | Freq: Once | INTRAMUSCULAR | Status: AC
  Administered 2022-02-11: 8 mL via INTRADERMAL

## 2022-02-11 MED ORDER — ADULT MULTIVITAMIN W/MINERALS CH
1.0000 | ORAL_TABLET | Freq: Every day | ORAL | Status: DC
  Administered 2022-02-11 – 2022-02-18 (×7): 1 via ORAL
  Filled 2022-02-11 (×7): qty 1

## 2022-02-11 MED ORDER — SENNOSIDES-DOCUSATE SODIUM 8.6-50 MG PO TABS
2.0000 | ORAL_TABLET | Freq: Two times a day (BID) | ORAL | Status: DC
  Administered 2022-02-11 – 2022-02-16 (×9): 2 via ORAL
  Filled 2022-02-11 (×11): qty 2

## 2022-02-11 MED ORDER — IBUPROFEN 400 MG PO TABS
600.0000 mg | ORAL_TABLET | Freq: Four times a day (QID) | ORAL | Status: AC
  Administered 2022-02-11 – 2022-02-13 (×7): 600 mg via ORAL
  Filled 2022-02-11 (×8): qty 2

## 2022-02-11 MED ORDER — POLYETHYLENE GLYCOL 3350 17 G PO PACK
17.0000 g | PACK | Freq: Every day | ORAL | Status: DC
  Administered 2022-02-11 – 2022-02-18 (×6): 17 g via ORAL
  Filled 2022-02-11 (×6): qty 1

## 2022-02-11 MED ORDER — HYDROMORPHONE HCL 1 MG/ML IJ SOLN
1.0000 mg | INTRAMUSCULAR | Status: DC | PRN
  Administered 2022-02-11: 1 mg via INTRAVENOUS
  Filled 2022-02-11: qty 1

## 2022-02-11 MED ORDER — HYDROMORPHONE HCL 1 MG/ML IJ SOLN
1.0000 mg | INTRAMUSCULAR | Status: AC | PRN
  Administered 2022-02-11: 1 mg via INTRAVENOUS
  Filled 2022-02-11: qty 1

## 2022-02-11 MED ORDER — HYDROCODONE-ACETAMINOPHEN 5-325 MG PO TABS
2.0000 | ORAL_TABLET | Freq: Four times a day (QID) | ORAL | Status: AC
  Administered 2022-02-11 – 2022-02-13 (×7): 2 via ORAL
  Filled 2022-02-11 (×8): qty 2

## 2022-02-11 MED ORDER — GLUCERNA SHAKE PO LIQD
237.0000 mL | Freq: Three times a day (TID) | ORAL | Status: DC
  Administered 2022-02-11 – 2022-02-18 (×12): 237 mL via ORAL

## 2022-02-11 MED ORDER — SODIUM CHLORIDE 0.9 % IV SOLN
1.0000 g | INTRAVENOUS | Status: DC
  Administered 2022-02-11 – 2022-02-12 (×2): 1 g via INTRAVENOUS
  Filled 2022-02-11: qty 1
  Filled 2022-02-11: qty 10
  Filled 2022-02-11: qty 1

## 2022-02-11 MED ORDER — CYCLOBENZAPRINE HCL 10 MG PO TABS
10.0000 mg | ORAL_TABLET | Freq: Three times a day (TID) | ORAL | Status: DC
  Administered 2022-02-11 – 2022-02-17 (×17): 10 mg via ORAL
  Filled 2022-02-11 (×17): qty 1

## 2022-02-11 MED ORDER — FENTANYL CITRATE (PF) 100 MCG/2ML IJ SOLN
INTRAMUSCULAR | Status: AC
  Filled 2022-02-11: qty 2

## 2022-02-11 MED ORDER — FENTANYL CITRATE (PF) 100 MCG/2ML IJ SOLN
INTRAMUSCULAR | Status: DC | PRN
  Administered 2022-02-11: 25 ug via INTRAVENOUS

## 2022-02-11 NOTE — Progress Notes (Signed)
Initial Nutrition Assessment  DOCUMENTATION CODES:   Not applicable  INTERVENTION:   -MVI with minerals daily -Glucerna Shake po TID, each supplement provides 220 kcal and 10 grams of protein  -Liberalize diet to carb modified for wider variety of meal selections  NUTRITION DIAGNOSIS:   Inadequate oral intake related to decreased appetite as evidenced by per patient/family report.  GOAL:   Patient will meet greater than or equal to 90% of their needs  MONITOR:   PO intake, Supplement acceptance  REASON FOR ASSESSMENT:   Malnutrition Screening Tool    ASSESSMENT:   Amber Glenn with history of hypertension, hyperlipidemia, non-insulin-dependent diabetes mellitus, depression, anxiety, who presents for chief concerns of low back pain not relieved with home pain medications.  Amber Glenn admitted with pathologic L1 compression fracture.   Reviewed I/O's: +500 ml x 24 hours   Per neurosurgery notes, plan for percutaneous fixation.   Spoke with Amber Glenn and granddaughter at bedside. Amber Glenn reports a general decline in health over the past 2 months, related to back pain. Per Amber Glenn, back pain has become so severe that it has significantly impaired her ability to work (stopped working 2 months ago secondary to fall). Due to pain, intake has also decreased. Amber Glenn consumed 2-3 meals per day (Breakfast: cereal OR bagel; Lunch: sandwich; Dinner: meat, starch, and vegetable). Amber Glenn consumed about 75% of breakfast today.   Per Amber Glenn, she estimates she has lost about 20 pounds over the pas t2 months.Reviewed wt hx; Amber Glenn has experienced a 7.2% wt loss over the past 13 months.   Discussed importance of good meal and supplement intake to promote healing. Amber Glenn amenable to supplements.   Medications reviewed and include vitamin C, miralax, and senokot.  Lab Results  Component Value Date   HGBA1C 5.9 (H) 02/11/2022   PTA DM medications are 500 mg metformin BID.   Labs reviewed: CBGS: 113-148 (inpatient orders for glycemic control  are 0-15 units insulin aspart TID with meals, 0-5 units insulin aspart daily at bedtime, 500 mg metformin daily).    NUTRITION - FOCUSED PHYSICAL EXAM:  Flowsheet Row Most Recent Value  Orbital Region Mild depletion  Upper Arm Region No depletion  Thoracic and Lumbar Region No depletion  Buccal Region No depletion  Temple Region No depletion  Clavicle Bone Region Mild depletion  Clavicle and Acromion Bone Region Mild depletion  Scapular Bone Region Mild depletion  Dorsal Hand Mild depletion  Patellar Region Mild depletion  Anterior Thigh Region Mild depletion  Posterior Calf Region Mild depletion  Edema (RD Assessment) None  Hair Reviewed  Eyes Reviewed  Mouth Reviewed  Skin Reviewed  Nails Reviewed       Diet Order:   Diet Order             Diet heart healthy/carb modified Room service appropriate? Yes; Fluid consistency: Thin  Diet effective now                   EDUCATION NEEDS:   Education needs have been addressed  Skin:  Skin Assessment: Reviewed RN Assessment  Last BM:  02/10/22  Height:   Ht Readings from Last 1 Encounters:  02/10/22 5' (1.524 m)    Weight:   Wt Readings from Last 1 Encounters:  02/10/22 59.4 kg    Ideal Body Weight:  45.5 kg  BMI:  Body mass index is 25.58 kg/m.  Estimated Nutritional Needs:   Kcal:  1600-1800  Protein:  85-100 grams  Fluid:  > 1.6 L  Loistine Chance, RD, LDN, [place] Registered Dietitian II Certified Diabetes Care and Education Specialist Please refer to [facility] for RD and/or RD on-call/weekend/after hours pager

## 2022-02-11 NOTE — H&P (View-Only) (Signed)
Referring Physician:  No referring provider defined for this encounter.  Primary Physician:  Lynnell Jude, MD  History of Present Illness: 02/11/2022 Amber Glenn is admitted with onset of worsening low back pain.  She was tried on outpatient medications but was unable to control her pain.  She reports pain particular when she bears weight.  She was brought to the emergency department where work-up revealed findings concerning for pathologic fracture.  She has no numbness or tingling in her legs.  She has no symptoms of cauda equina syndrome.  She does report weight loss over the past several months.  Review of Systems:  A 10 point review of systems is negative, except for the pertinent positives and negatives detailed in the HPI.  Past Medical History: Past Medical History:  Diagnosis Date   Arthritis    hands and feet   Cataract    right   Complication of anesthesia    sometimes have a hard time waking up   Diabetes mellitus    Borderline Diabetes   GERD (gastroesophageal reflux disease)    HTN (hypertension)    Hyperlipidemia    Motion sickness    Post corneal transplant    right    Past Surgical History: Past Surgical History:  Procedure Laterality Date   BASAL CELL CARCINOMA EXCISION     Dr. Manley Mason   CARPAL TUNNEL RELEASE     right   COLONOSCOPY WITH PROPOFOL N/A 12/03/2015   Procedure: COLONOSCOPY WITH PROPOFOL;  Surgeon: Manya Silvas, MD;  Location: [place];  Service: Endoscopy;  Laterality: N/A;   COLONOSCOPY WITH PROPOFOL N/A 02/22/2020   Procedure: COLONOSCOPY WITH PROPOFOL;  Surgeon: Lesly Rubenstein, MD;  Location: [facility];  Service: Endoscopy;  Laterality: N/A;   ESOPHAGOGASTRODUODENOSCOPY (EGD) WITH PROPOFOL N/A 02/22/2020   Procedure: ESOPHAGOGASTRODUODENOSCOPY (EGD) WITH PROPOFOL;  Surgeon: Lesly Rubenstein, MD;  Location: [facility];  Service: Endoscopy;  Laterality: N/A;   HAMMER TOE SURGERY Right 05/23/2019    Procedure: HAMMER TOE CORRECTION;  Surgeon: Samara Deist, DPM;  Location: [place];  Service: Podiatry;  Laterality: Right;  Diabetic   OSTECTOMY Right 05/23/2019   Procedure: DOUBLE OSTEOTOMY RIGHT;  Surgeon: Samara Deist, DPM;  Location: [place];  Service: Podiatry;  Laterality: Right;   SKIN CANCER EXCISION      Allergies: Allergies as of 02/10/2022   (No Known Allergies)    Medications: Current Meds  Medication Sig   acetaminophen (TYLENOL) 500 MG tablet Take 500 mg by mouth every 6 (six) hours as needed for moderate pain, mild pain, fever or headache.   Ascorbic Acid (VITAMIN C) 1000 MG tablet Take 1,000 mg by mouth daily.     carvedilol (COREG) 3.125 MG tablet Take 3.125 mg by mouth 2 (two) times daily with a meal.   gabapentin (NEURONTIN) 100 MG capsule Take 100 mg by mouth 3 (three) times daily.   HYDROcodone-acetaminophen (NORCO/VICODIN) 5-325 MG tablet Take 1 tablet by mouth every 6 (six) hours as needed for moderate pain or severe pain.   ibuprofen (ADVIL) 200 MG tablet Take 200 mg by mouth every 6 (six) hours as needed for moderate pain, mild pain, headache or fever.   losartan (COZAAR) 100 MG tablet Take 100 mg by mouth daily.   meloxicam (MOBIC) 15 MG tablet Take 15 mg by mouth daily.   metFORMIN (GLUCOPHAGE) 500 MG tablet TAKE 1 TABLET TWICE DAILY WITH A MEAL   metFORMIN (GLUCOPHAGE) 500 MG tablet Take  500 mg by mouth daily with breakfast.   methocarbamol (ROBAXIN) 500 MG tablet Take 500 mg by mouth 3 (three) times daily.   Multiple Vitamin (MULTIVITAMIN) capsule Take 1 capsule by mouth daily.     omeprazole (PRILOSEC) 20 MG capsule Take 1 capsule (20 mg total) by mouth daily.   Oxycodone HCl 10 MG TABS Take 10 mg by mouth 3 (three) times daily as needed.   simvastatin (ZOCOR) 40 MG tablet Take 40 mg by mouth daily at 6 PM.   traMADol (ULTRAM) 50 MG tablet Take 50 mg by mouth every 6 (six) hours as needed for severe pain or moderate pain.     Social History: Social History   Tobacco Use   Smoking status: Former    Types: Cigarettes    Quit date: 01/13/2009    Years since quitting: 13.0   Smokeless tobacco: Never  Substance Use Topics   Alcohol use: No   Drug use: No    Family Medical History: Family History  Problem Relation Age of Onset   Heart disease Mother    Heart disease Father    Heart disease Maternal Grandmother    Heart disease Maternal Grandfather    Breast cancer Sister 21    Physical Examination: Vitals:   02/11/22 0844 02/11/22 0845  BP: (!) 146/75   Pulse: (!) 111 (!) 109  Resp: 16   Temp: (!) 97.3 F (36.3 C)   SpO2: 100% 100%    General: Patient is well developed, well nourished, calm, collected, and in no apparent distress. Attention to examination is appropriate.  Neck:   Supple.  Full range of motion.  Respiratory: Patient is breathing without any difficulty.   NEUROLOGICAL:     Awake, alert, oriented to person, place, and time.  Speech is clear and fluent. Fund of knowledge is appropriate.   Cranial Nerves: Pupils equal round and reactive to light.  Facial tone is symmetric.  Facial sensation is symmetric. Shoulder shrug is symmetric. Tongue protrusion is midline.    Strength: Side Biceps Triceps Deltoid Interossei Grip Wrist Ext. Wrist Flex.  R 5 5 5 5 5 5 5   L 5 5 5 5 5 5 5    Side Iliopsoas Quads Hamstring PF DF EHL  R 5 5 5 5 5 5   L 5 5 5 5 5 5    Reflexes are 1+ and symmetric at the biceps, triceps, brachioradialis, patella and achilles.   Hoffman's is absent.   Bilateral upper and lower extremity sensation is intact to light touch.    No evidence of dysmetria noted.  Gait is untested.     Medical Decision Making  Imaging: MRI L spine 02/10/2022 IMPRESSION: Acute to subacute compression deformity at the L1 vertebral body level with posterior bulging of the cortex resulting in mild spinal canal stenosis at this level. There is diffuse T1 hypointense  signal abnormality in the L1 vertebral body with involvement of the bilateral pedicles, as well as the bilateral transverse processes. This raises concerns for a pathologic fracture. Recommend further evaluation with CT of the lumbar spine and correlate with history of malignancy.     Electronically Signed   By: Marin Roberts M.D.   On: 02/10/2022 15:57    CT CAP 02/10/2022 IMPRESSION: 1. 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 There is also mild left supraclavicular adenopathy. 2. Scattered tree-in-bud reticulonodular opacities in the lungs, right greater than left, characteristic for atypical infectious bronchiolitis. One of the largest nodules is a 12 by 7 by 11 mm nodule in the right lateral costophrenic angle, which could be malignant or infectious. 3. Subtle abnormal hypoenhancement inferiorly in the pancreatic body measuring about 1.2 by 0.7 by 1.3 cm, along with some indistinct hypoenhancement in the pancreatic head. This could be inflammatory but infiltrative pancreatic adenocarcinoma cannot be excluded. This warrants detailed workup with either pancreatic protocol CT or MRI with and without contrast. PET-CT could also be helpful in this case. 4. As shown on lumbar spine MRI, there is a pathologic (malignant) fracture at L1 with diffuse abnormal lysis involving the vertebral body, bilateral pedicles, and bilateral transverse processes at this level with about 65% of vertebral body height and with a posterior bulging contour of the vertebral body by about 5-6 mm. Further detail on dedicated lumbar spine CT. 5. Mild acute sigmoid colon diverticulitis. Tumor in the vicinity of the local inflammation along the  sigmoid colon is not totally excluded but is a less likely differential diagnostic consideration. 6. Aortic atherosclerosis.  Coronary atherosclerosis.   Aortic Atherosclerosis (ICD10-I70.0).     Electronically Signed   By: Van Clines M.D.   On: 02/10/2022 17:24    I have personally reviewed the images and agree with the above interpretation.  Assessment and Plan: Ms. Bradt is a pleasant 71 y.o. female with pathologic L1 compression fracture.  She has involvement of anterior middle and posterior column bilaterally at L1.  There is nearly full replacement of her right pedicle with tumor, as well as substantial replacement on the left side.  After full evaluation, I think it is most appropriate to consider percutaneous fixation to stabilize this fracture.  I think she is a very high risk of worsening of her fracture without fixation.  She is undergoing ultrasound-guided biopsy to establish pathologic results to determine further treatment.  For this spine lesion, she will ultimately need radiation treatment after percutaneous fixation.      Cashlyn Huguley K. Izora Ribas MD, [facility] Neurosurgery

## 2022-02-11 NOTE — Progress Notes (Signed)
Patient clinically stable post Ln biopsy per Dr Kathlene Cote, only local along with Fentanyl 25 mcg IV given for biopsy, awake/alert and oriented post procedure. Report given to patients care nurse post procedure.

## 2022-02-11 NOTE — Consult Note (Signed)
Referring Physician:  No referring provider defined for this encounter.  Primary Physician:  Lynnell Jude, MD  History of Present Illness: 02/11/2022 Amber Glenn is admitted with onset of worsening low back pain.  She was tried on outpatient medications but was unable to control her pain.  She reports pain particular when she bears weight.  She was brought to the emergency department where work-up revealed findings concerning for pathologic fracture.  She has no numbness or tingling in her legs.  She has no symptoms of cauda equina syndrome.  She does report weight loss over the past several months.  Review of Systems:  A 10 point review of systems is negative, except for the pertinent positives and negatives detailed in the HPI.  Past Medical History: Past Medical History:  Diagnosis Date   Arthritis    hands and feet   Cataract    right   Complication of anesthesia    sometimes have a hard time waking up   Diabetes mellitus    Borderline Diabetes   GERD (gastroesophageal reflux disease)    HTN (hypertension)    Hyperlipidemia    Motion sickness    Post corneal transplant    right    Past Surgical History: Past Surgical History:  Procedure Laterality Date   BASAL CELL CARCINOMA EXCISION     Dr. Manley Mason   CARPAL TUNNEL RELEASE     right   COLONOSCOPY WITH PROPOFOL N/A 12/03/2015   Procedure: COLONOSCOPY WITH PROPOFOL;  Surgeon: Manya Silvas, MD;  Location: [place];  Service: Endoscopy;  Laterality: N/A;   COLONOSCOPY WITH PROPOFOL N/A 02/22/2020   Procedure: COLONOSCOPY WITH PROPOFOL;  Surgeon: Lesly Rubenstein, MD;  Location: [facility];  Service: Endoscopy;  Laterality: N/A;   ESOPHAGOGASTRODUODENOSCOPY (EGD) WITH PROPOFOL N/A 02/22/2020   Procedure: ESOPHAGOGASTRODUODENOSCOPY (EGD) WITH PROPOFOL;  Surgeon: Lesly Rubenstein, MD;  Location: [facility];  Service: Endoscopy;  Laterality: N/A;   HAMMER TOE SURGERY Right 05/23/2019    Procedure: HAMMER TOE CORRECTION;  Surgeon: Samara Deist, DPM;  Location: [place];  Service: Podiatry;  Laterality: Right;  Diabetic   OSTECTOMY Right 05/23/2019   Procedure: DOUBLE OSTEOTOMY RIGHT;  Surgeon: Samara Deist, DPM;  Location: [place];  Service: Podiatry;  Laterality: Right;   SKIN CANCER EXCISION      Allergies: Allergies as of 02/10/2022   (No Known Allergies)    Medications: Current Meds  Medication Sig   acetaminophen (TYLENOL) 500 MG tablet Take 500 mg by mouth every 6 (six) hours as needed for moderate pain, mild pain, fever or headache.   Ascorbic Acid (VITAMIN C) 1000 MG tablet Take 1,000 mg by mouth daily.     carvedilol (COREG) 3.125 MG tablet Take 3.125 mg by mouth 2 (two) times daily with a meal.   gabapentin (NEURONTIN) 100 MG capsule Take 100 mg by mouth 3 (three) times daily.   HYDROcodone-acetaminophen (NORCO/VICODIN) 5-325 MG tablet Take 1 tablet by mouth every 6 (six) hours as needed for moderate pain or severe pain.   ibuprofen (ADVIL) 200 MG tablet Take 200 mg by mouth every 6 (six) hours as needed for moderate pain, mild pain, headache or fever.   losartan (COZAAR) 100 MG tablet Take 100 mg by mouth daily.   meloxicam (MOBIC) 15 MG tablet Take 15 mg by mouth daily.   metFORMIN (GLUCOPHAGE) 500 MG tablet TAKE 1 TABLET TWICE DAILY WITH A MEAL   metFORMIN (GLUCOPHAGE) 500 MG tablet Take  500 mg by mouth daily with breakfast.   methocarbamol (ROBAXIN) 500 MG tablet Take 500 mg by mouth 3 (three) times daily.   Multiple Vitamin (MULTIVITAMIN) capsule Take 1 capsule by mouth daily.     omeprazole (PRILOSEC) 20 MG capsule Take 1 capsule (20 mg total) by mouth daily.   Oxycodone HCl 10 MG TABS Take 10 mg by mouth 3 (three) times daily as needed.   simvastatin (ZOCOR) 40 MG tablet Take 40 mg by mouth daily at 6 PM.   traMADol (ULTRAM) 50 MG tablet Take 50 mg by mouth every 6 (six) hours as needed for severe pain or moderate pain.     Social History: Social History   Tobacco Use   Smoking status: Former    Types: Cigarettes    Quit date: 01/13/2009    Years since quitting: 13.0   Smokeless tobacco: Never  Substance Use Topics   Alcohol use: No   Drug use: No    Family Medical History: Family History  Problem Relation Age of Onset   Heart disease Mother    Heart disease Father    Heart disease Maternal Grandmother    Heart disease Maternal Grandfather    Breast cancer Sister 19    Physical Examination: Vitals:   02/11/22 0844 02/11/22 0845  BP: (!) 146/75   Pulse: (!) 111 (!) 109  Resp: 16   Temp: (!) 97.3 F (36.3 C)   SpO2: 100% 100%    General: Patient is well developed, well nourished, calm, collected, and in no apparent distress. Attention to examination is appropriate.  Neck:   Supple.  Full range of motion.  Respiratory: Patient is breathing without any difficulty.   NEUROLOGICAL:     Awake, alert, oriented to person, place, and time.  Speech is clear and fluent. Fund of knowledge is appropriate.   Cranial Nerves: Pupils equal round and reactive to light.  Facial tone is symmetric.  Facial sensation is symmetric. Shoulder shrug is symmetric. Tongue protrusion is midline.    Strength: Side Biceps Triceps Deltoid Interossei Grip Wrist Ext. Wrist Flex.  R 5 5 5 5 5 5 5   L 5 5 5 5 5 5 5    Side Iliopsoas Quads Hamstring PF DF EHL  R 5 5 5 5 5 5   L 5 5 5 5 5 5    Reflexes are 1+ and symmetric at the biceps, triceps, brachioradialis, patella and achilles.   Hoffman's is absent.   Bilateral upper and lower extremity sensation is intact to light touch.    No evidence of dysmetria noted.  Gait is untested.     Medical Decision Making  Imaging: MRI L spine 02/10/2022 IMPRESSION: Acute to subacute compression deformity at the L1 vertebral body level with posterior bulging of the cortex resulting in mild spinal canal stenosis at this level. There is diffuse T1 hypointense  signal abnormality in the L1 vertebral body with involvement of the bilateral pedicles, as well as the bilateral transverse processes. This raises concerns for a pathologic fracture. Recommend further evaluation with CT of the lumbar spine and correlate with history of malignancy.     Electronically Signed   By: Marin Roberts M.D.   On: 02/10/2022 15:57    CT CAP 02/10/2022 IMPRESSION: 1. 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 There is also mild left supraclavicular adenopathy. 2. Scattered tree-in-bud reticulonodular opacities in the lungs, right greater than left, characteristic for atypical infectious bronchiolitis. One of the largest nodules is a 12 by 7 by 11 mm nodule in the right lateral costophrenic angle, which could be malignant or infectious. 3. Subtle abnormal hypoenhancement inferiorly in the pancreatic body measuring about 1.2 by 0.7 by 1.3 cm, along with some indistinct hypoenhancement in the pancreatic head. This could be inflammatory but infiltrative pancreatic adenocarcinoma cannot be excluded. This warrants detailed workup with either pancreatic protocol CT or MRI with and without contrast. PET-CT could also be helpful in this case. 4. As shown on lumbar spine MRI, there is a pathologic (malignant) fracture at L1 with diffuse abnormal lysis involving the vertebral body, bilateral pedicles, and bilateral transverse processes at this level with about 65% of vertebral body height and with a posterior bulging contour of the vertebral body by about 5-6 mm. Further detail on dedicated lumbar spine CT. 5. Mild acute sigmoid colon diverticulitis. Tumor in the vicinity of the local inflammation along the  sigmoid colon is not totally excluded but is a less likely differential diagnostic consideration. 6. Aortic atherosclerosis.  Coronary atherosclerosis.   Aortic Atherosclerosis (ICD10-I70.0).     Electronically Signed   By: Van Clines M.D.   On: 02/10/2022 17:24    I have personally reviewed the images and agree with the above interpretation.  Assessment and Plan: Ms. Latka is a pleasant 71 y.o. female with pathologic L1 compression fracture.  She has involvement of anterior middle and posterior column bilaterally at L1.  There is nearly full replacement of her right pedicle with tumor, as well as substantial replacement on the left side.  After full evaluation, I think it is most appropriate to consider percutaneous fixation to stabilize this fracture.  I think she is a very high risk of worsening of her fracture without fixation.  She is undergoing ultrasound-guided biopsy to establish pathologic results to determine further treatment.  For this spine lesion, she will ultimately need radiation treatment after percutaneous fixation.      Hayat Warbington K. Izora Ribas MD, [facility] Neurosurgery

## 2022-02-11 NOTE — TOC Initial Note (Signed)
Transition of Care [facility]) - Initial/Assessment Note    Patient Details  Name: Amber Glenn MRN: [id number] Date of Birth: 1951-01-05  Transition of Care [facility]) CM/SW Contact:    Laurena Slimmer, RN Phone Number: 02/11/2022, 4:39 PM  Clinical Narrative:                  Transition of Care [facility]) Screening Note   Patient Details  Name: Amber Glenn Date of Birth: [birth year]   Transition of Care [facility]) CM/SW Contact:    Laurena Slimmer, RN Phone Number: 02/11/2022, 4:39 PM    Transition of Care Department [facility]) has reviewed patient and no TOC needs have been identified at this time. We will continue to monitor patient advancement through interdisciplinary progression rounds. If new patient transition needs arise, please place a TOC consult.          Patient Goals and CMS Choice        Expected Discharge Plan and Services                                                Prior Living Arrangements/Services                       Activities of Daily Living Home Assistive Devices/Equipment: Eyeglasses ADL Screening (condition at time of admission) Patient's cognitive ability adequate to safely complete daily activities?: Yes Is the patient deaf or have difficulty hearing?: No Does the patient have difficulty seeing, even when wearing glasses/contacts?: No Does the patient have difficulty concentrating, remembering, or making decisions?: Yes Patient able to express need for assistance with ADLs?: Yes Does the patient have difficulty dressing or bathing?: Yes Independently performs ADLs?: No Does the patient have difficulty walking or climbing stairs?: Yes Weakness of Legs: Both Weakness of Arms/Hands: Both  Permission Sought/Granted                  Emotional Assessment              Admission diagnosis:  Mediastinal mass [J98.59] Pathologic fracture [M84.40XA] Pathological fracture of lumbar vertebra, initial encounter  [M84.48XA] Acute low back pain, unspecified back pain laterality, unspecified whether sciatica present [M54.50] Patient Active Problem List   Diagnosis Date Noted   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   Neoplasm related pain 02/11/2022   Supraclavicular adenopathy 02/11/2022   Mediastinal mass 02/11/2022   Goals of care, counseling/discussion 02/11/2022   Pathologic fracture of lumbar vertebra, initial encounter 02/11/2022   Pathologic fracture 02/10/2022   Pyuria 02/10/2022   Multifocal pneumonia    Diarrhea    COVID-19 08/18/2019   Thumb pain 06/26/2015   Medicare annual wellness visit, subsequent 03/14/2015   Arthralgia 09/02/2014   Hot flashes 01/21/2014   Left shoulder pain 09/20/2013   Unspecified constipation 08/30/2012   Routine general medical examination at a health care facility 05/29/2012   Screening for colon cancer 05/29/2012   Hypertension 08/18/2011   Osteoarthritis 03/20/2009   Diabetes mellitus type 2, controlled ([place]) 11/14/2008   BASAL CELL CARCINOMA, FACE 12/06/2006   Hyperlipidemia 12/06/2006   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

## 2022-02-11 NOTE — Procedures (Signed)
Interventional Radiology Procedure Note  Procedure: US Guided Biopsy of left supraclavicular lymph node  Complications: None  Estimated Blood Loss: < 10 mL  Findings: 18 G core biopsy of 1.2 cm left supraclavicular LN performed under US guidance.  Four core samples obtained and sent to Pathology.  Venetia Night. Kathlene Cote, M.D Pager:  [phone]

## 2022-02-11 NOTE — Progress Notes (Signed)
PROGRESS NOTE    Amber Glenn   EHU:[id number] DOB: 1950/06/11  DOA: 02/10/2022 Date of Service: 02/11/22 PCP: Lynnell Jude, MD     Brief Narrative / Hospital Course:  Amber Glenn is a 71 year old female with history of hypertension, hyperlipidemia, non-insulin-dependent diabetes mellitus, depression, anxiety, who presents emergency department for chief concerns of low back pain not relieved with home pain medications. 11/01: in ED, afebrile, tachycardic 110 to 130 bpm, hypotensive 157/91, SPO2 100% on RA.  Unremarkable BMP, mild anemia Hgb 11.5 and MCV 79.  UA positive leukocytes, nitrate, with 11-20 WBC/hpf. Imaging: MRI lumbar spine: Acute/subacute compression deformity of L1, mild spinal canal stenosis due to posterior bulging cortex.  Diffuse T1 hypointense abnormality in the L1 vertebral body/pedicle/TP, raising concerns for pathologic fracture.  CT lumbar spine demonstrated tumor involvement of bilateral pedicles, right greater than left TP, right lamina with bony expansion at this level contributing to borderline impingement at T12/L1.  CT chest/abdomen/pelvis demonstrated 3.1 cm centrally necrotic mediastinal mass left prevascular region, scattered reticulonodular opacities in the lungs concerning for atypical infectious bronchiolitis, larger nodule in the right lateral costophrenic angle malignant/infectious, inflammatory pancreatic changes versus infiltrative pancreatic adenocarcinoma, pathologic lumbar spine fracture as noted above, mild acute sigmoid colon diverticulitis and tumor not excluded in this area. ED treatment: Keflex 500 mg p.o. one-time dose, fentanyl 50 mcg one-time dose, morphine one-time dose, Dilaudid 0.5 mg IV one-time dose, ondansetron. 11/02: Planned US guided lymph node biopsy. Pain control has been difficult.     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 Janese Banks following Pain control: Hydrocodone-APAP 5-325 mg two tabs q6h, ibuprofen 600 mg q6h, dilaudid IV 1 mg q3h prn severe pain, dilaudid 0.5 mg q2h prn breakthrough. Advised pt and family will plan weaning down tomorrow to po meds / pending other surgeries  Per Onc: IR consulted 11/02 to biopsy mediastinal mass, consider kyphoplasty possibly Monday (today is Thursday)    Hypertension Losartan 100 mg daily resumed, carvedilol 3.125 mg p.o. twice daily resumed Hydralazine 5 mg IV every 8 hours.  For SBP greater than 180, 4 days ordered   Hyperlipidemia Simvastatin 40 mg daily resumed   Diabetes mellitus type 2, controlled ([facility]) Insulin SSI with at bedtime coverage ordered    DVT prophylaxis: heparin Pertinent IV fluids/nutrition: tolerating po, no continuous IV fluids  Central lines / invasive devices: none   Code Status: FULL CODE Family Communication: daughters at bedside, one of them states she is a Marine scientist at hospice   Disposition: currently observation, will need changed to inpatient  TOC needs: none at this time Barriers to discharge / significant pending items: pending biopsy and requiring IV pain control anticipate will need rehab/SNF and will be here through the weekend  Subjective:   Patient reports back pain, no CP/SOB.        Objective:  Vitals:   02/11/22 0042 02/11/22 0457 02/11/22 0844 02/11/22 0845  BP: (!) 152/73 128/72 (!) 146/75   Pulse: (!) 111 (!) 106 (!) 111 (!) 109  Resp: 16 18 16    Temp: 97.7 F (36.5 C) 97.9 F (36.6 C) (!) 97.3 F (36.3 C)   TempSrc:      SpO2: 100% 100% 100% 100%  Weight:      Height:        Intake/Output Summary (Last 24 hours) at 02/11/2022 1256 Last data filed at 02/10/2022 1311 Gross per 24 hour  Intake 500 ml  Output --  Net 500 ml   Filed Weights   02/10/22 1120 02/10/22 2031  Weight: 59.4 kg 59.4 kg    Examination:  Constitutional:  VS as above General Appearance: alert, well-developed, well-nourished, NAD Respiratory: Normal respiratory effort No wheeze No rhonchi No rales Cardiovascular: S1/S2 normal No murmur No rub/gallop auscultated No lower extremity edema Gastrointestinal: No tenderness Musculoskeletal:  No clubbing/cyanosis of digits Symmetrical movement in all extremities Neurological: No cranial nerve deficit on limited exam Alert Psychiatric: Normal judgment/insight Normal mood and affect       Scheduled Medications:   vitamin C  1,000 mg Oral Daily   carvedilol  3.125 mg Oral BID WC   cyclobenzaprine  10 mg Oral TID   docusate sodium  100 mg Oral Daily   gabapentin  100 mg Oral TID   heparin  5,000 Units Subcutaneous Q8H   HYDROcodone-acetaminophen  2 tablet Oral Q6H   ibuprofen  600 mg Oral Q6H   influenza vaccine adjuvanted  0.5 mL Intramuscular Tomorrow-1000   insulin aspart  0-15 Units Subcutaneous TID WC   insulin aspart  0-5 Units Subcutaneous QHS   losartan  100 mg Oral Daily   metFORMIN  500 mg Oral Q breakfast   polyethylene glycol  17 g Oral Daily   simvastatin  40 mg Oral q1800    Continuous Infusions:   PRN Medications:  alum & mag hydroxide-simeth, hydrALAZINE, HYDROmorphone (DILAUDID) injection, ondansetron **OR** ondansetron (ZOFRAN) IV,  senna-docusate  Antimicrobials:  Anti-infectives (From admission, onward)    Start     Dose/Rate Route Frequency Ordered Stop   02/10/22 1800  azithromycin (ZITHROMAX) tablet 500 mg        500 mg Oral  Once 02/10/22 1758 02/10/22 1826   02/10/22 1530  cephALEXin (KEFLEX) capsule 500 mg        500 mg Oral  Once 02/10/22 1523 02/10/22 1551       Data Reviewed: I have personally reviewed following labs and imaging studies  CBC: Recent Labs  Lab 02/10/22 1148 02/11/22 0322  WBC 9.7 6.9  NEUTROABS 8.3*  --   HGB 11.5* 10.5*  HCT 38.4 35.5*  MCV 79.2* 80.1  PLT 265 127   Basic Metabolic Panel: Recent Labs  Lab 02/10/22 1148 02/11/22 0322  NA 135 137  K 4.4 4.6  CL 101 102  CO2 28 27  GLUCOSE 140* 119*  BUN 17 13  CREATININE 0.74 0.60  CALCIUM 9.7 9.6   GFR: Estimated Creatinine Clearance: 50.5 mL/min (by C-G formula based on SCr of 0.6 mg/dL). Liver Function Tests: No results for input(s): "AST", "ALT", "ALKPHOS", "BILITOT", "PROT", "ALBUMIN" in the last 168 hours. No results for input(s): "LIPASE", "AMYLASE" in the last 168 hours. No results for input(s): "AMMONIA" in the last 168 hours. Coagulation Profile:  No results for input(s): "INR", "PROTIME" in the last 168 hours. Cardiac Enzymes: No results for input(s): "CKTOTAL", "CKMB", "CKMBINDEX", "TROPONINI" in the last 168 hours. BNP (last 3 results) No results for input(s): "PROBNP" in the last 8760 hours. HbA1C: Recent Labs    02/11/22 0322  HGBA1C 5.9*   CBG: Recent Labs  Lab 02/10/22 2201 02/11/22 0848 02/11/22 1148  GLUCAP 126* 113* 148*   Lipid Profile: No results for input(s): "CHOL", "HDL", "LDLCALC", "TRIG", "CHOLHDL", "LDLDIRECT" in the last 72 hours. Thyroid Function Tests: No results for input(s): "TSH", "T4TOTAL", "FREET4", "T3FREE", "THYROIDAB" in the last 72 hours. Anemia Panel: No results for input(s): "VITAMINB12", "FOLATE", "FERRITIN", "TIBC", "IRON", "RETICCTPCT" in the last 72  hours. Urine analysis:    Component Value Date/Time   COLORURINE YELLOW (A) 02/10/2022 1148   APPEARANCEUR HAZY (A) 02/10/2022 1148   LABSPEC 1.026 02/10/2022 1148   PHURINE 5.0 02/10/2022 1148   GLUCOSEU NEGATIVE 02/10/2022 1148   HGBUR NEGATIVE 02/10/2022 1148   BILIRUBINUR NEGATIVE 02/10/2022 1148   KETONESUR 20 (A) 02/10/2022 1148   PROTEINUR NEGATIVE 02/10/2022 1148   NITRITE POSITIVE (A) 02/10/2022 1148   LEUKOCYTESUR SMALL (A) 02/10/2022 1148   Sepsis Labs: @LABRCNTIP (procalcitonin:4,lacticidven:4)  No results found for this or any previous visit (from the past 240 hour(s)).       Radiology Studies: CT T-SPINE NO CHARGE  Result Date: 02/10/2022 CLINICAL DATA:  Pathologic fracture at L1 EXAM: CT THORACIC AND LUMBAR SPINE WITHOUT CONTRAST TECHNIQUE: Multidetector CT imaging of the thoracic and lumbar spine was performed without contrast. Multiplanar CT image reconstructions were also generated. RADIATION DOSE REDUCTION: This exam was performed according to the departmental dose-optimization program which includes automated exposure control, adjustment of the mA and/or kV according to patient size and/or use of iterative reconstruction technique. COMPARISON:  MRI lumbar spine 02/11/2012 FINDINGS: CT THORACIC SPINE FINDINGS Alignment: No vertebral subluxation is observed. Thoracic kyphosis noted. Vertebrae: No thoracic spine fracture or acute bony finding. There is multilevel bridging spurring anterior to the thoracic spine at the T2 through T10 levels. Paraspinal and other soft tissues: Please see dedicated CT chest report. Disc levels: No substantial thoracic spine impingement is noted. There is left paracentral posterior intervertebral spurring at the T8-9 level but not causing central narrowing of the thecal sac. CT LUMBAR SPINE FINDINGS Segmentation: The lowest lumbar type non-rib-bearing vertebra is labeled as L5. Alignment: 5 mm of grade 1 degenerative anterolisthesis at L4-5.  Vertebrae: Pathologic fracture at L1 with about 65% loss of vertebral body height, with the posterior contour of the vertebral body bulging back 6 mm in a manner highly suspicious for pathologic underlying tumor. The abnormal associated lucency in the vertebral body extends into both pedicles and into the transverse processes (right greater than left, with expansion of the pedicles and right transverse process. There is some mild extension into the right lamina on image 25 series 4. Mild degenerative endplate sclerosis at the L3-4 and L4-5 levels with loss of intervertebral disc height. No other lytic lesions are identified in the lumbar spine. Paraspinal and other soft tissues: Please see dedicated CT abdomen report. Disc levels: T12-L1: Borderline bilateral foraminal stenosis due to tumor expansion of the bilateral pedicles. Cannot exclude right subarticular lateral recess stenosis given the tumor expansion in this vicinity borderline central narrowing of the thecal sac due to posterior bony retropulsion/bulging. L1-2: No impingement. L2-3: No impingement.  Diffuse disc bulge. L3-4: No impingement.  Right eccentric disc bulge. L4-5: Moderate to prominent right and mild  left foraminal stenosis and borderline central narrowing of the thecal sac due to subluxation, disc uncovering, and facet arthropathy. L5-S1: No impingement.  Central disc protrusion. IMPRESSION: 1. L1 pathologic fracture with 65% loss of vertebral body height and posterior bulging of the vertebral body by 0.6 cm. Tumor involvement of the bilateral pedicles, right greater than left transverse process, and right lamina. Bony expansion at this level contributes to borderline impingement at T12-L1. No other osseous metastatic disease in the thoracolumbar spine identified. 2. Moderate to prominent impingement at L4-5 due to subluxation with disc uncovering and facet arthropathy. Electronically Signed   By: Van Clines M.D.   On: 02/10/2022 17:37    CT L-SPINE NO CHARGE  Result Date: 02/10/2022 CLINICAL DATA:  Pathologic fracture at L1 EXAM: CT THORACIC AND LUMBAR SPINE WITHOUT CONTRAST TECHNIQUE: Multidetector CT imaging of the thoracic and lumbar spine was performed without contrast. Multiplanar CT image reconstructions were also generated. RADIATION DOSE REDUCTION: This exam was performed according to the departmental dose-optimization program which includes automated exposure control, adjustment of the mA and/or kV according to patient size and/or use of iterative reconstruction technique. COMPARISON:  MRI lumbar spine 02/11/2012 FINDINGS: CT THORACIC SPINE FINDINGS Alignment: No vertebral subluxation is observed. Thoracic kyphosis noted. Vertebrae: No thoracic spine fracture or acute bony finding. There is multilevel bridging spurring anterior to the thoracic spine at the T2 through T10 levels. Paraspinal and other soft tissues: Please see dedicated CT chest report. Disc levels: No substantial thoracic spine impingement is noted. There is left paracentral posterior intervertebral spurring at the T8-9 level but not causing central narrowing of the thecal sac. CT LUMBAR SPINE FINDINGS Segmentation: The lowest lumbar type non-rib-bearing vertebra is labeled as L5. Alignment: 5 mm of grade 1 degenerative anterolisthesis at L4-5. Vertebrae: Pathologic fracture at L1 with about 65% loss of vertebral body height, with the posterior contour of the vertebral body bulging back 6 mm in a manner highly suspicious for pathologic underlying tumor. The abnormal associated lucency in the vertebral body extends into both pedicles and into the transverse processes (right greater than left, with expansion of the pedicles and right transverse process. There is some mild extension into the right lamina on image 25 series 4. Mild degenerative endplate sclerosis at the L3-4 and L4-5 levels with loss of intervertebral disc height. No other lytic lesions are identified in  the lumbar spine. Paraspinal and other soft tissues: Please see dedicated CT abdomen report. Disc levels: T12-L1: Borderline bilateral foraminal stenosis due to tumor expansion of the bilateral pedicles. Cannot exclude right subarticular lateral recess stenosis given the tumor expansion in this vicinity borderline central narrowing of the thecal sac due to posterior bony retropulsion/bulging. L1-2: No impingement. L2-3: No impingement.  Diffuse disc bulge. L3-4: No impingement.  Right eccentric disc bulge. L4-5: Moderate to prominent right and mild left foraminal stenosis and borderline central narrowing of the thecal sac due to subluxation, disc uncovering, and facet arthropathy. L5-S1: No impingement.  Central disc protrusion. IMPRESSION: 1. L1 pathologic fracture with 65% loss of vertebral body height and posterior bulging of the vertebral body by 0.6 cm. Tumor involvement of the bilateral pedicles, right greater than left transverse process, and right lamina. Bony expansion at this level contributes to borderline impingement at T12-L1. No other osseous metastatic disease in the thoracolumbar spine identified. 2. Moderate to prominent impingement at L4-5 due to subluxation with disc uncovering and facet arthropathy. Electronically Signed   By: Van Clines M.D.   On: 02/10/2022  17:37   CT CHEST ABDOMEN PELVIS W CONTRAST  Result Date: 02/10/2022 CLINICAL DATA:  Occult malignancy, staging workup.  Severe pain. * Tracking Code: BO * EXAM: CT CHEST, ABDOMEN, AND PELVIS WITH CONTRAST TECHNIQUE: Multidetector CT imaging of the chest, abdomen and pelvis was performed following the standard protocol during bolus administration of intravenous contrast. RADIATION DOSE REDUCTION: This exam was performed according to the departmental dose-optimization program which includes automated exposure control, adjustment of the mA and/or kV according to patient size and/or use of iterative reconstruction technique.  CONTRAST:  137mL OMNIPAQUE IOHEXOL 300 MG/ML  SOLN COMPARISON:  Lumbar MRI 02/10/2022 and CT abdomen pelvis 01/02/2013 FINDINGS: CT CHEST FINDINGS Cardiovascular: Coronary, aortic arch, and branch vessel atherosclerotic vascular disease. Mediastinum/Nodes: Centrally necrotic 3.1 by 2.4 by 2.9 cm anterior mediastinal mass in the left prevascular region on image 17 series 2, with mild mass effect on the brachiocephalic vein as shown on image 16 series 2. This is in the immediate vicinity of the phrenic nerve, and the elevation of the left hemidiaphragm is new compared to 08/11/2019 chest radiograph, raising suspicion for phrenic nerve impingement leading to diaphragmatic dysfunction. Left supraclavicular node, 1.0 cm in short axis on image 9 series 2. Right eccentric subcarinal node 1.0 cm in short axis on image 28 series 2. Lungs/Pleura: Scattered scarring and/or atelectasis in the left lower lobe and lingula. This is particularly notable anteriorly in the left lower lobe. Bronchiectasis and volume loss medially in the right middle lobe for example on image 98 series 3. Scattered tree-in-bud reticulonodular opacities are present in the lungs, right greater than left, characteristic for atypical infectious bronchiolitis. In light of the pathologic adenopathy in the chest this raises questions as to which of the visualized small nodules might be malignant versus infectious. One of the largest nodules is a 12 by 7 by 11 mm (volume = 500 mm^3) nodule in the right lateral costophrenic angle on image 107 of series 3, with clustered reticulonodular opacities just cephalad to this lesion. Peripheral atelectasis versus mild pleural thickening posteriorly in the left lower lobe. Musculoskeletal: As noted above, the left hemidiaphragm is elevated, and the left anterior mediastinal mass which runs directly in the vicinity of the left phrenic nerve is implicated as a likely cause. Degenerative arthropathy at the sternoclavicular  joints. Thoracic spondylosis. CT ABDOMEN PELVIS FINDINGS Hepatobiliary: Fluid density sharply defined 1.4 by 1.0 cm cyst posteriorly in segment 6 of the liver on image 59 series 2, this previously measured 0.6 cm in diameter back in 2014. Gallbladder unremarkable. No other liver lesion identified. Pancreas: Subtle abnormal hypoenhancement inferiorly in the pancreatic body measuring about 1.2 by 0.7 by 1.3 cm on image 38 of series 5. The pancreatic head is also mildly hypoenhancing. This could be inflammatory but infiltrative pancreatic adenocarcinoma cannot be excluded. This warrants detailed workup with either pancreatic protocol CT or MRI with and without contrast. No dorsal pancreatic duct dilatation identified. Spleen: Unremarkable Adrenals/Urinary Tract: Unremarkable Stomach/Bowel: Prominent stool throughout the colon favors constipation. Sigmoid colon diverticulosis is present and there is an inflamed diverticula and inflammatory findings the junction of the descending and sigmoid colon on image 31 series 5 suggesting mild acute diverticulitis. Tumor in this vicinity with local resulting inflammation is a differential diagnostic consideration given the findings elsewhere in today's examination. Vascular/Lymphatic: Atherosclerosis is present, including aortoiliac atherosclerotic disease. There is atheromatous calcification but only mild stenosis proximally in the superior mesenteric artery, with no findings of occlusion. No pathologic adenopathy observed in the  abdomen/pelvis. Reproductive: Unremarkable Other: No supplemental non-categorized findings. Musculoskeletal: As shown on the lumbar spine MRI, there is a pathologic fracture at L1 with diffuse abnormal lysis involving the vertebral body, bilateral pedicles, and bilateral transverse processes at this level with about 65% of vertebral body height and with a posterior bulging contour of the vertebral body by about 5-6 mm. Further detail on dedicated lumbar  spine CT. IMPRESSION: 1. 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 There is also mild left supraclavicular adenopathy. 2. Scattered tree-in-bud reticulonodular opacities in the lungs, right greater than left, characteristic for atypical infectious bronchiolitis. One of the largest nodules is a 12 by 7 by 11 mm nodule in the right lateral costophrenic angle, which could be malignant or infectious. 3. Subtle abnormal hypoenhancement inferiorly in the pancreatic body measuring about 1.2 by 0.7 by 1.3 cm, along with some indistinct hypoenhancement in the pancreatic head. This could be inflammatory but infiltrative pancreatic adenocarcinoma cannot be excluded. This warrants detailed workup with either pancreatic protocol CT or MRI with and without contrast. PET-CT could also be helpful in this case. 4. As shown on lumbar spine MRI, there is a pathologic (malignant) fracture at L1 with diffuse abnormal lysis involving the vertebral body, bilateral pedicles, and bilateral transverse processes at this level with about 65% of vertebral body height and with a posterior bulging contour of the vertebral body by about 5-6 mm. Further detail on dedicated lumbar spine CT. 5. Mild acute sigmoid colon diverticulitis. Tumor in the vicinity of the local inflammation along the sigmoid colon is not totally excluded but is a less likely differential diagnostic consideration. 6. Aortic atherosclerosis.  Coronary atherosclerosis. Aortic Atherosclerosis (ICD10-I70.0). Electronically Signed   By: Van Clines M.D.   On: 02/10/2022 17:24   MR LUMBAR SPINE WO CONTRAST  Result Date: 02/10/2022 CLINICAL DATA:  Known compression fracture at L1. worsening pain EXAM: MRI LUMBAR  SPINE WITHOUT CONTRAST TECHNIQUE: Multiplanar, multisequence MR imaging of the lumbar spine was performed. No intravenous contrast was administered. COMPARISON:  None Available. FINDINGS: Segmentation:  Standard. Alignment: Trace retrolisthesis of L3 on L4. Grade 1 anterolisthesis of L4 on L5. Vertebrae: There is a acute to subacute compression deformity at the L1 vertebral body level with posterior bulging of the cortex. This results in mild spinal canal narrowing at this level. There is diffuse T1 hypointense signal abnormality in the L1 vertebral body level which involve the bilateral pedicles, as well as the bilateral transverse processes (series 9, image 11). Cortex, particularly at the right transverse process is poorly visualized. Conus medullaris and cauda equina: Conus extends to the L1-L2 level. Conus and cauda equina appear normal. 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 There is atrophy of the paraspinal musculature. No retroperitoneal lymphadenopathy. Bilateral adrenal glands are normal in appearance. Disc levels: T11-T12: No evidence of spinal canal or neural foraminal stenosis. T12-L1: Mild spinal canal stenosis. Mild bilateral neural foraminal stenosis. L1-L2: No spinal canal stenosis. Moderate right neural foraminal stenosis. No left neural foraminal stenosis. L2-L3: Circumferential disc bulge. Mild bilateral facet degenerative change. Mild spinal canal stenosis. No neural foraminal stenosis. L3-L4: Circumferential disc bulge. Mild bilateral facet degenerative change. Moderate left neural foraminal stenosis. No  right neural foraminal stenosis. Mild spinal canal narrowing. L4-L5: Grade 1 anterolisthesis. Severe bilateral facet degenerative change. Severe right neural foraminal stenosis. Mild left neural foraminal stenosis. Mild spinal canal narrowing. L5-S1: No spinal canal or neural foraminal stenosis.  IMPRESSION: Acute to subacute compression deformity at the L1 vertebral body level with posterior bulging of the cortex resulting in mild spinal canal stenosis at this level. There is diffuse T1 hypointense signal abnormality in the L1 vertebral body with involvement of the bilateral pedicles, as well as the bilateral transverse processes. This raises concerns for a pathologic fracture. Recommend further evaluation with CT of the lumbar spine and correlate with history of malignancy. Electronically Signed   By: Marin Roberts M.D.   On: 02/10/2022 15:57            LOS: 0 days    Time spent: 50 minutes    Emeterio Reeve, DO [facility] 02/11/2022, 12:56 PM   Staff may message me via secure chat in [place]  but this may not receive immediate response,  please page for urgent matters!  If 7PM-7AM, please contact night-coverage [email or web address]  Dictation software was used to generate the above note. Typos may occur and escape review, as with typed/written notes. Please contact Dr Sheppard Coil directly for clarity if needed.

## 2022-02-12 ENCOUNTER — Inpatient Hospital Stay
Admit: 2022-02-12 | Discharge: 2022-02-12 | Disposition: A | Discharge status: Home / Self Care | Payer: Self-pay | Attending: Neurosurgery | Admitting: Neurosurgery

## 2022-02-12 ENCOUNTER — Other Ambulatory Visit: Payer: Self-pay

## 2022-02-12 DIAGNOSIS — Z7189 Other specified counseling: Secondary | ICD-10-CM | POA: Diagnosis not present

## 2022-02-12 DIAGNOSIS — M8448XA Pathological fracture, other site, initial encounter for fracture: Secondary | ICD-10-CM | POA: Diagnosis not present

## 2022-02-12 DIAGNOSIS — K219 Gastro-esophageal reflux disease without esophagitis: Secondary | ICD-10-CM | POA: Diagnosis not present

## 2022-02-12 DIAGNOSIS — Z049 Encounter for examination and observation for unspecified reason: Secondary | ICD-10-CM

## 2022-02-12 DIAGNOSIS — M8458XA Pathological fracture in neoplastic disease, other specified site, initial encounter for fracture: Secondary | ICD-10-CM | POA: Diagnosis not present

## 2022-02-12 DIAGNOSIS — E119 Type 2 diabetes mellitus without complications: Secondary | ICD-10-CM | POA: Diagnosis not present

## 2022-02-12 LAB — CBC
HCT: 32.6 % — ABNORMAL LOW (ref 36.0–46.0)
Hemoglobin: 9.9 g/dL — ABNORMAL LOW (ref 12.0–15.0)
MCH: 24 pg — ABNORMAL LOW (ref 26.0–34.0)
MCHC: 30.4 g/dL (ref 30.0–36.0)
MCV: 79.1 fL — ABNORMAL LOW (ref 80.0–100.0)
Platelets: 281 10*3/uL (ref 150–400)
RBC: 4.12 MIL/uL (ref 3.87–5.11)
RDW: 16 % — ABNORMAL HIGH (ref 11.5–15.5)
WBC: 8.1 10*3/uL (ref 4.0–10.5)
nRBC: 0 % (ref 0.0–0.2)

## 2022-02-12 LAB — GLUCOSE, CAPILLARY
Glucose-Capillary: 95 mg/dL (ref 70–99)
Glucose-Capillary: 128 mg/dL — ABNORMAL HIGH (ref 70–99)
Glucose-Capillary: 118 mg/dL — ABNORMAL HIGH (ref 70–99)
Glucose-Capillary: 140 mg/dL — ABNORMAL HIGH (ref 70–99)

## 2022-02-12 LAB — URINE CULTURE: Culture: >=100000 — AB

## 2022-02-12 LAB — BASIC METABOLIC PANEL
Anion gap: 7 (ref 5–15)
BUN: 19 mg/dL (ref 8–23)
CO2: 28 mmol/L (ref 22–32)
Calcium: 9.6 mg/dL (ref 8.9–10.3)
Chloride: 100 mmol/L (ref 98–111)
Creatinine, Ser: 0.88 mg/dL (ref 0.44–1.00)
GFR, Estimated: >60 mL/min (ref >60)
Glucose, Bld: 103 mg/dL — ABNORMAL HIGH (ref 70–99)
Potassium: 4.4 mmol/L (ref 3.5–5.1)
Sodium: 135 mmol/L (ref 135–145)

## 2022-02-12 MED ORDER — PANTOPRAZOLE SODIUM 40 MG PO TBEC
40.0000 mg | DELAYED_RELEASE_TABLET | Freq: Every day | ORAL | Status: DC
  Administered 2022-02-12 – 2022-02-18 (×6): 40 mg via ORAL
  Filled 2022-02-12 (×6): qty 1

## 2022-02-12 MED ORDER — HYDROMORPHONE HCL 1 MG/ML IJ SOLN
0.5000 mg | INTRAMUSCULAR | Status: DC | PRN
  Administered 2022-02-12: 1 mg via INTRAVENOUS
  Administered 2022-02-14: 0.5 mg via INTRAVENOUS
  Administered 2022-02-14 – 2022-02-16 (×6): 1 mg via INTRAVENOUS
  Administered 2022-02-16: 0.5 mg via INTRAVENOUS
  Administered 2022-02-16: 1 mg via INTRAVENOUS
  Filled 2022-02-12 (×11): qty 1

## 2022-02-12 MED ORDER — LACTULOSE 10 GM/15ML PO SOLN
20.0000 g | Freq: Two times a day (BID) | ORAL | Status: DC | PRN
  Administered 2022-02-12: 20 g via ORAL
  Filled 2022-02-12: qty 30

## 2022-02-12 NOTE — Progress Notes (Signed)
    Attending Progress Note  History: Amber Glenn is here for pathologic L1 fracture.  HD2: Having continued pain.  Had biopsy yesterday  Physical Exam: Vitals:   02/12/22 0026 02/12/22 0415  BP: (!) 101/52 (!) 97/54  Pulse: 97 100  Resp: 20 20  Temp: 98.2 F (36.8 C) 98 F (36.7 C)  SpO2: 100% 100%    AA Ox3 CNI  Strength:5/5 throughout BLE  Data:  Other tests/results: see prior notes  Assessment/Plan:  Amber Glenn is here with an unstable pathologic L1 fracture.  - mobilize - pain control - DVT prophylaxis - PTOT in brace ok - She has an unstable fracture that is at significant risk of worsening with radiation.  Due to 3 column involvement, I do not feel that vertebroplasty will be effective.  I recommended fixation, which will be performed when the OR is available.  That is unfortunately on 11/6.  She can ambulate with TLSO until then.   Meade Maw MD, [facility] Department of Neurosurgery

## 2022-02-12 NOTE — Progress Notes (Signed)
Orthopedic Tech Progress Note Patient Details:  TRAMYA SCHOENFELDER [birth year] [id number] Called in TLSO brace to [facility] Patient ID: [name], female   DOB: 10/25/1950, 71 y.o.   MRN: [id number]  Chip Boer 02/12/2022, 8:56 AM

## 2022-02-12 NOTE — Plan of Care (Signed)

## 2022-02-12 NOTE — Progress Notes (Signed)
Contacted Ortho tech for TLSO brace.

## 2022-02-12 NOTE — Progress Notes (Signed)
PROGRESS NOTE    [name]   JQB:[id number] DOB: [birth year]  DOA: 02/10/2022 Date of Service: 02/12/22 PCP: Amber Jude, MD     Brief Narrative / Hospital Course:  Amber Glenn is a 71 year old female with history of hypertension, hyperlipidemia, non-insulin-dependent diabetes mellitus, depression, anxiety, who presents emergency department for chief concerns of low back pain not relieved with home pain medications. 11/01: in ED, afebrile, tachycardic 110 to 130 bpm, hypotensive 157/91, SPO2 100% on RA.  Unremarkable BMP, mild anemia Hgb 11.5 and MCV 79.  UA positive leukocytes, nitrate, with 11-20 WBC/hpf. Imaging: MRI lumbar spine: Acute/subacute compression deformity of L1, mild spinal canal stenosis due to posterior bulging cortex.  Diffuse T1 hypointense abnormality in the L1 vertebral body/pedicle/TP, raising concerns for pathologic fracture.  CT lumbar spine demonstrated tumor involvement of bilateral pedicles, right greater than left TP, right lamina with bony expansion at this level contributing to borderline impingement at T12/L1.  CT chest/abdomen/pelvis demonstrated 3.1 cm centrally necrotic mediastinal mass left prevascular region, scattered reticulonodular opacities in the lungs concerning for atypical infectious bronchiolitis, larger nodule in the right lateral costophrenic angle malignant/infectious, inflammatory pancreatic changes versus infiltrative pancreatic adenocarcinoma, pathologic lumbar spine fracture as noted above, mild acute sigmoid colon diverticulitis and tumor not excluded in this area. ED treatment: Keflex 500 mg p.o. one-time dose, fentanyl 50 mcg one-time dose, morphine one-time dose, Dilaudid 0.5 mg IV one-time dose, ondansetron. 11/02: Planned US guided lymph node biopsy. Pain control has been difficult.  11/03: Neurosurgery planning for intervention on Monday (today is Friday). Tapering down on dilaudid.     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 Janese Glenn following Pain control: Hydrocodone-APAP 5-325 mg two tabs q6h, ibuprofen 600 mg q6h, dilaudid IV 1 mg q3h prn severe pain, dilaudid 0.5 mg q2h prn breakthrough. Advised pt and family will plan weaning down tomorrow to po meds / pending other surgeries  Per Onc: IR consulted 11/02 to biopsy mediastinal mass, planning kyphoplasty w/ neurosurgery possibly Monday (today is Thursday)    Hypertension Losartan 100 mg daily resumed, carvedilol 3.125 mg p.o. twice daily resumed Hydralazine 5 mg IV every 8 hours.  For SBP greater than 180, 4 days ordered   Hyperlipidemia Simvastatin 40 mg daily resumed   Diabetes mellitus type 2, controlled ([facility]) Insulin SSI with at bedtime coverage ordered    DVT prophylaxis: heparin Pertinent IV fluids/nutrition: tolerating po, no continuous IV fluids  Central lines / invasive devices: none   Code Status: FULL CODE Family Communication: granddaughter at at bedside  Disposition: inpatient  TOC needs: none at this time Barriers to discharge / significant pending items: pending biopsy resutls, procedure on Monday, and requiring IV pain control anticipate will need rehab/SNF and will be here through the  weekend  Subjective:  Patient reports back pain is better, she is resting comfortably in bed. NO CP/SOB.        Objective:  Vitals:   02/11/22 2117 02/12/22 0026 02/12/22 0415 02/12/22 0700  BP: (!) 121/56 (!) 101/52 (!) 97/54 102/60  Pulse: (!) 107 97 100 74  Resp: 20 20 20 20   Temp: 98 F (36.7 C) 98.2 F (36.8 C) 98 F (36.7 C) 98.3 F (36.8 C)  TempSrc:    Oral  SpO2: 92% 100% 100% 98%  Weight:      Height:        Intake/Output Summary (Last 24 hours) at 02/12/2022 1345 Last data filed at 02/12/2022 1020 Gross per 24 hour  Intake 240 ml  Output --  Net 240 ml   Filed Weights   02/10/22 1120 02/10/22 2031  Weight: 59.4 kg 59.4 kg    Examination:  Constitutional:  VS as above General Appearance: alert, well-developed, well-nourished, NAD Respiratory: Normal respiratory effort No wheeze No rhonchi No rales Cardiovascular: S1/S2 normal No murmur No rub/gallop auscultated No lower extremity edema Gastrointestinal: No tenderness Musculoskeletal:  No clubbing/cyanosis of digits Symmetrical movement in all extremities Neurological: No cranial nerve deficit on limited exam Alert Psychiatric: Normal judgment/insight Normal mood and affect       Scheduled Medications:   vitamin C  1,000 mg Oral Daily   carvedilol  3.125 mg Oral BID WC   cyclobenzaprine  10 mg Oral TID   feeding supplement (GLUCERNA SHAKE)  237 mL Oral TID BM   gabapentin  100 mg Oral TID   heparin  5,000 Units Subcutaneous Q8H   HYDROcodone-acetaminophen  2 tablet Oral Q6H   ibuprofen  600 mg Oral Q6H   influenza vaccine adjuvanted  0.5 mL Intramuscular Tomorrow-1000   insulin aspart  0-15 Units Subcutaneous TID WC   insulin aspart  0-5 Units Subcutaneous QHS   losartan  100 mg Oral Daily   metFORMIN  500 mg Oral Q breakfast   multivitamin with minerals  1 tablet Oral Daily   pantoprazole  40 mg Oral Daily   polyethylene glycol  17 g Oral Daily    senna-docusate  2 tablet Oral BID   simvastatin  40 mg Oral q1800    Continuous Infusions:  cefTRIAXone (ROCEPHIN)  IV 1 g (02/11/22 1650)    PRN Medications:  alum & mag hydroxide-simeth, fentaNYL, hydrALAZINE, HYDROmorphone (DILAUDID) injection, ondansetron **OR** ondansetron (ZOFRAN) IV  Antimicrobials:  Anti-infectives (From admission, onward)    Start     Dose/Rate Route Frequency Ordered Stop   02/11/22 1600  cefTRIAXone (ROCEPHIN) 1 g in sodium chloride 0.9 % 100 mL IVPB        1 g 200 mL/hr over 30 Minutes Intravenous Every 24 hours 02/11/22 1514     02/10/22 1800  azithromycin (ZITHROMAX) tablet 500 mg        500 mg Oral  Once 02/10/22 1758 02/10/22 1826   02/10/22 1530  cephALEXin (KEFLEX) capsule 500 mg        500 mg Oral  Once 02/10/22 1523 02/10/22 1551       Data Reviewed: I have personally reviewed following labs and imaging studies  CBC: Recent Labs  Lab 02/10/22 1148 02/11/22 0322 02/12/22 0343  WBC 9.7 6.9 8.1  NEUTROABS 8.3*  --   --   HGB 11.5* 10.5* 9.9*  HCT 38.4 35.5* 32.6*  MCV 79.2* 80.1 79.1*  PLT 265 273 175   Basic Metabolic Panel: Recent Labs  Lab 02/10/22 1148 02/11/22 0322 02/12/22 0343  NA 135 137 135  K 4.4 4.6 4.4  CL 101 102 100  CO2 28 27 28   GLUCOSE 140* 119* 103*  BUN 17 13 19   CREATININE 0.74 0.60 0.88  CALCIUM 9.7 9.6 9.6   GFR: Estimated Creatinine Clearance: 45.9 mL/min (by C-G formula based on SCr of 0.88 mg/dL). Liver Function Tests: No results for input(s): "AST", "ALT", "ALKPHOS", "BILITOT", "PROT", "ALBUMIN" in the last 168 hours. No results for input(s): "LIPASE", "AMYLASE" in the last 168 hours. No results for input(s): "AMMONIA" in the last 168 hours. Coagulation Profile: No results for input(s): "INR", "PROTIME" in the last 168 hours. Cardiac Enzymes: No results for input(s): "CKTOTAL", "CKMB", "CKMBINDEX", "TROPONINI" in the last 168 hours. BNP (last 3 results) No results for input(s): "PROBNP" in  the last 8760 hours. HbA1C: Recent Labs    02/11/22 0322  HGBA1C 5.9*   CBG: Recent Labs  Lab 02/11/22 1336 02/11/22 1715 02/11/22 2114 02/12/22 0814 02/12/22 1201  GLUCAP 116* 119* 139* 95 128*   Lipid Profile: No results for input(s): "CHOL", "HDL", "LDLCALC", "TRIG", "CHOLHDL", "LDLDIRECT" in the last 72 hours. Thyroid Function Tests: No results for input(s): "TSH", "T4TOTAL", "FREET4", "T3FREE", "THYROIDAB" in the last 72 hours. Anemia Panel: No results for input(s): "VITAMINB12", "FOLATE", "FERRITIN", "TIBC", "IRON", "RETICCTPCT" in the last 72 hours. Urine analysis:    Component Value Date/Time   COLORURINE YELLOW (A) 02/10/2022 1148   APPEARANCEUR HAZY (A) 02/10/2022 1148   LABSPEC 1.026 02/10/2022 1148   PHURINE 5.0 02/10/2022 1148   GLUCOSEU NEGATIVE 02/10/2022 1148   HGBUR NEGATIVE 02/10/2022 1148   BILIRUBINUR NEGATIVE 02/10/2022 1148   KETONESUR 20 (A) 02/10/2022 1148   PROTEINUR NEGATIVE 02/10/2022 1148   NITRITE POSITIVE (A) 02/10/2022 1148   LEUKOCYTESUR SMALL (A) 02/10/2022 1148   Sepsis Labs: @LABRCNTIP (procalcitonin:4,lacticidven:4)  Recent Results (from the past 240 hour(s))  Urine Culture     Status: Abnormal   Collection Time: 02/10/22 11:48 AM   Specimen: Urine, Clean Catch  Result Value Ref Range Status   Specimen Description   Final    URINE, CLEAN CATCH Performed at [facility], [address], [place], [place] [address]    Special Requests   Final    NONE Performed at [facility], [address], [place], [place] [address]    Culture >=100,000 COLONIES/mL ESCHERICHIA COLI (A)  Final   Report Status 02/12/2022 FINAL  Final   Organism ID, Bacteria ESCHERICHIA COLI (A)  Final      Susceptibility   Escherichia coli - MIC*    AMPICILLIN >=32 RESISTANT Resistant     CEFAZOLIN <=4 SENSITIVE Sensitive     CEFEPIME <=0.12 SENSITIVE Sensitive     CEFTRIAXONE <=0.25 SENSITIVE Sensitive     CIPROFLOXACIN <=0.25  SENSITIVE Sensitive     GENTAMICIN <=1 SENSITIVE Sensitive     IMIPENEM <=0.25 SENSITIVE Sensitive     NITROFURANTOIN <=16 SENSITIVE Sensitive     TRIMETH/SULFA <=20 SENSITIVE Sensitive     AMPICILLIN/SULBACTAM 16 INTERMEDIATE Intermediate     PIP/TAZO <=4 SENSITIVE Sensitive     * >=100,000 COLONIES/mL ESCHERICHIA COLI         Radiology Studies: CT T-SPINE NO CHARGE  Result Date: 02/10/2022 CLINICAL DATA:  Pathologic fracture at L1 EXAM: CT THORACIC AND LUMBAR SPINE WITHOUT CONTRAST TECHNIQUE: Multidetector CT imaging of the thoracic and lumbar spine was performed without contrast. Multiplanar CT image reconstructions were also generated. RADIATION DOSE REDUCTION: This exam  was performed according to the departmental dose-optimization program which includes automated exposure control, adjustment of the mA and/or kV according to patient size and/or use of iterative reconstruction technique. COMPARISON:  MRI lumbar spine 02/11/2012 FINDINGS: CT THORACIC SPINE FINDINGS Alignment: No vertebral subluxation is observed. Thoracic kyphosis noted. Vertebrae: No thoracic spine fracture or acute bony finding. There is multilevel bridging spurring anterior to the thoracic spine at the T2 through T10 levels. Paraspinal and other soft tissues: Please see dedicated CT chest report. Disc levels: No substantial thoracic spine impingement is noted. There is left paracentral posterior intervertebral spurring at the T8-9 level but not causing central narrowing of the thecal sac. CT LUMBAR SPINE FINDINGS Segmentation: The lowest lumbar type non-rib-bearing vertebra is labeled as L5. Alignment: 5 mm of grade 1 degenerative anterolisthesis at L4-5. Vertebrae: Pathologic fracture at L1 with about 65% loss of vertebral body height, with the posterior contour of the vertebral body bulging back 6 mm in a manner highly suspicious for pathologic underlying tumor. The abnormal associated lucency in the vertebral body extends  into both pedicles and into the transverse processes (right greater than left, with expansion of the pedicles and right transverse process. There is some mild extension into the right lamina on image 25 series 4. Mild degenerative endplate sclerosis at the L3-4 and L4-5 levels with loss of intervertebral disc height. No other lytic lesions are identified in the lumbar spine. Paraspinal and other soft tissues: Please see dedicated CT abdomen report. Disc levels: T12-L1: Borderline bilateral foraminal stenosis due to tumor expansion of the bilateral pedicles. Cannot exclude right subarticular lateral recess stenosis given the tumor expansion in this vicinity borderline central narrowing of the thecal sac due to posterior bony retropulsion/bulging. L1-2: No impingement. L2-3: No impingement.  Diffuse disc bulge. L3-4: No impingement.  Right eccentric disc bulge. L4-5: Moderate to prominent right and mild left foraminal stenosis and borderline central narrowing of the thecal sac due to subluxation, disc uncovering, and facet arthropathy. L5-S1: No impingement.  Central disc protrusion. IMPRESSION: 1. L1 pathologic fracture with 65% loss of vertebral body height and posterior bulging of the vertebral body by 0.6 cm. Tumor involvement of the bilateral pedicles, right greater than left transverse process, and right lamina. Bony expansion at this level contributes to borderline impingement at T12-L1. No other osseous metastatic disease in the thoracolumbar spine identified. 2. Moderate to prominent impingement at L4-5 due to subluxation with disc uncovering and facet arthropathy. Electronically Signed   By: Van Clines M.D.   On: 02/10/2022 17:37   CT L-SPINE NO CHARGE  Result Date: 02/10/2022 CLINICAL DATA:  Pathologic fracture at L1 EXAM: CT THORACIC AND LUMBAR SPINE WITHOUT CONTRAST TECHNIQUE: Multidetector CT imaging of the thoracic and lumbar spine was performed without contrast. Multiplanar CT image  reconstructions were also generated. RADIATION DOSE REDUCTION: This exam was performed according to the departmental dose-optimization program which includes automated exposure control, adjustment of the mA and/or kV according to patient size and/or use of iterative reconstruction technique. COMPARISON:  MRI lumbar spine 02/11/2012 FINDINGS: CT THORACIC SPINE FINDINGS Alignment: No vertebral subluxation is observed. Thoracic kyphosis noted. Vertebrae: No thoracic spine fracture or acute bony finding. There is multilevel bridging spurring anterior to the thoracic spine at the T2 through T10 levels. Paraspinal and other soft tissues: Please see dedicated CT chest report. Disc levels: No substantial thoracic spine impingement is noted. There is left paracentral posterior intervertebral spurring at the T8-9 level but not causing central narrowing of the thecal  sac. CT LUMBAR SPINE FINDINGS Segmentation: The lowest lumbar type non-rib-bearing vertebra is labeled as L5. Alignment: 5 mm of grade 1 degenerative anterolisthesis at L4-5. Vertebrae: Pathologic fracture at L1 with about 65% loss of vertebral body height, with the posterior contour of the vertebral body bulging back 6 mm in a manner highly suspicious for pathologic underlying tumor. The abnormal associated lucency in the vertebral body extends into both pedicles and into the transverse processes (right greater than left, with expansion of the pedicles and right transverse process. There is some mild extension into the right lamina on image 25 series 4. Mild degenerative endplate sclerosis at the L3-4 and L4-5 levels with loss of intervertebral disc height. No other lytic lesions are identified in the lumbar spine. Paraspinal and other soft tissues: Please see dedicated CT abdomen report. Disc levels: T12-L1: Borderline bilateral foraminal stenosis due to tumor expansion of the bilateral pedicles. Cannot exclude right subarticular lateral recess stenosis given  the tumor expansion in this vicinity borderline central narrowing of the thecal sac due to posterior bony retropulsion/bulging. L1-2: No impingement. L2-3: No impingement.  Diffuse disc bulge. L3-4: No impingement.  Right eccentric disc bulge. L4-5: Moderate to prominent right and mild left foraminal stenosis and borderline central narrowing of the thecal sac due to subluxation, disc uncovering, and facet arthropathy. L5-S1: No impingement.  Central disc protrusion. IMPRESSION: 1. L1 pathologic fracture with 65% loss of vertebral body height and posterior bulging of the vertebral body by 0.6 cm. Tumor involvement of the bilateral pedicles, right greater than left transverse process, and right lamina. Bony expansion at this level contributes to borderline impingement at T12-L1. No other osseous metastatic disease in the thoracolumbar spine identified. 2. Moderate to prominent impingement at L4-5 due to subluxation with disc uncovering and facet arthropathy. Electronically Signed   By: Van Clines M.D.   On: 02/10/2022 17:37   CT CHEST ABDOMEN PELVIS W CONTRAST  Result Date: 02/10/2022 CLINICAL DATA:  Occult malignancy, staging workup.  Severe pain. * Tracking Code: BO * EXAM: CT CHEST, ABDOMEN, AND PELVIS WITH CONTRAST TECHNIQUE: Multidetector CT imaging of the chest, abdomen and pelvis was performed following the standard protocol during bolus administration of intravenous contrast. RADIATION DOSE REDUCTION: This exam was performed according to the departmental dose-optimization program which includes automated exposure control, adjustment of the mA and/or kV according to patient size and/or use of iterative reconstruction technique. CONTRAST:  122mL OMNIPAQUE IOHEXOL 300 MG/ML  SOLN COMPARISON:  Lumbar MRI 02/10/2022 and CT abdomen pelvis 01/02/2013 FINDINGS: CT CHEST FINDINGS Cardiovascular: Coronary, aortic arch, and branch vessel atherosclerotic vascular disease. Mediastinum/Nodes: Centrally necrotic  3.1 by 2.4 by 2.9 cm anterior mediastinal mass in the left prevascular region on image 17 series 2, with mild mass effect on the brachiocephalic vein as shown on image 16 series 2. This is in the immediate vicinity of the phrenic nerve, and the elevation of the left hemidiaphragm is new compared to 08/11/2019 chest radiograph, raising suspicion for phrenic nerve impingement leading to diaphragmatic dysfunction. Left supraclavicular node, 1.0 cm in short axis on image 9 series 2. Right eccentric subcarinal node 1.0 cm in short axis on image 28 series 2. Lungs/Pleura: Scattered scarring and/or atelectasis in the left lower lobe and lingula. This is particularly notable anteriorly in the left lower lobe. Bronchiectasis and volume loss medially in the right middle lobe for example on image 98 series 3. Scattered tree-in-bud reticulonodular opacities are present in the lungs, right greater than left, characteristic for atypical  infectious bronchiolitis. In light of the pathologic adenopathy in the chest this raises questions as to which of the visualized small nodules might be malignant versus infectious. One of the largest nodules is a 12 by 7 by 11 mm (volume = 500 mm^3) nodule in the right lateral costophrenic angle on image 107 of series 3, with clustered reticulonodular opacities just cephalad to this lesion. Peripheral atelectasis versus mild pleural thickening posteriorly in the left lower lobe. Musculoskeletal: As noted above, the left hemidiaphragm is elevated, and the left anterior mediastinal mass which runs directly in the vicinity of the left phrenic nerve is implicated as a likely cause. Degenerative arthropathy at the sternoclavicular joints. Thoracic spondylosis. CT ABDOMEN PELVIS FINDINGS Hepatobiliary: Fluid density sharply defined 1.4 by 1.0 cm cyst posteriorly in segment 6 of the liver on image 59 series 2, this previously measured 0.6 cm in diameter back in 2014. Gallbladder unremarkable. No other  liver lesion identified. Pancreas: Subtle abnormal hypoenhancement inferiorly in the pancreatic body measuring about 1.2 by 0.7 by 1.3 cm on image 38 of series 5. The pancreatic head is also mildly hypoenhancing. This could be inflammatory but infiltrative pancreatic adenocarcinoma cannot be excluded. This warrants detailed workup with either pancreatic protocol CT or MRI with and without contrast. No dorsal pancreatic duct dilatation identified. Spleen: Unremarkable Adrenals/Urinary Tract: Unremarkable Stomach/Bowel: Prominent stool throughout the colon favors constipation. Sigmoid colon diverticulosis is present and there is an inflamed diverticula and inflammatory findings the junction of the descending and sigmoid colon on image 31 series 5 suggesting mild acute diverticulitis. Tumor in this vicinity with local resulting inflammation is a differential diagnostic consideration given the findings elsewhere in today's examination. Vascular/Lymphatic: Atherosclerosis is present, including aortoiliac atherosclerotic disease. There is atheromatous calcification but only mild stenosis proximally in the superior mesenteric artery, with no findings of occlusion. No pathologic adenopathy observed in the abdomen/pelvis. Reproductive: Unremarkable Other: No supplemental non-categorized findings. Musculoskeletal: As shown on the lumbar spine MRI, there is a pathologic fracture at L1 with diffuse abnormal lysis involving the vertebral body, bilateral pedicles, and bilateral transverse processes at this level with about 65% of vertebral body height and with a posterior bulging contour of the vertebral body by about 5-6 mm. Further detail on dedicated lumbar spine CT. IMPRESSION: 1. 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 This is in the immediate vicinity of the phrenic nerve, with new elevation of the left hemidiaphragm compatible  with phrenic nerve impingement and diaphragmatic dysfunction. There is also mild left supraclavicular adenopathy. 2. Scattered tree-in-bud reticulonodular opacities in the lungs, right greater than left, characteristic for atypical infectious bronchiolitis. One of the largest nodules is a 12 by 7 by 11 mm nodule in the right lateral costophrenic angle, which could be malignant or infectious. 3. Subtle abnormal hypoenhancement inferiorly in the pancreatic body measuring about 1.2 by 0.7 by 1.3 cm, along with some indistinct hypoenhancement in the pancreatic head. This could be inflammatory but infiltrative pancreatic adenocarcinoma cannot be excluded. This warrants detailed workup with either pancreatic protocol CT or MRI with and without contrast. PET-CT could also be helpful in this case. 4. As shown on lumbar spine MRI, there is a pathologic (malignant) fracture at L1 with diffuse abnormal lysis involving the vertebral body, bilateral pedicles, and bilateral transverse processes at this level with about 65% of vertebral body height and with a posterior bulging contour of the vertebral body by about 5-6 mm.  Further detail on dedicated lumbar spine CT. 5. Mild acute sigmoid colon diverticulitis. Tumor in the vicinity of the local inflammation along the sigmoid colon is not totally excluded but is a less likely differential diagnostic consideration. 6. Aortic atherosclerosis.  Coronary atherosclerosis. Aortic Atherosclerosis (ICD10-I70.0). Electronically Signed   By: Van Clines M.D.   On: 02/10/2022 17:24   MR LUMBAR SPINE WO CONTRAST  Result Date: 02/10/2022 CLINICAL DATA:  Known compression fracture at L1. worsening pain EXAM: MRI LUMBAR SPINE WITHOUT CONTRAST TECHNIQUE: Multiplanar, multisequence MR imaging of the lumbar spine was performed. No intravenous contrast was administered. COMPARISON:  None Available. FINDINGS: Segmentation:  Standard. Alignment: Trace retrolisthesis of L3 on L4. Grade 1  anterolisthesis of L4 on L5. Vertebrae: There is a acute to subacute compression deformity at the L1 vertebral body level with posterior bulging of the cortex. This results in mild spinal canal narrowing at this level. There is diffuse T1 hypointense signal abnormality in the L1 vertebral body level which involve the bilateral pedicles, as well as the bilateral transverse processes (series 9, image 11). Cortex, particularly at the right transverse process is poorly visualized. Conus medullaris and cauda equina: Conus extends to the L1-L2 level. Conus and cauda equina appear normal. 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 There is atrophy of the paraspinal musculature. No retroperitoneal lymphadenopathy. Bilateral adrenal glands are normal in appearance. Disc levels: T11-T12: No evidence of spinal canal or neural foraminal stenosis. T12-L1: Mild spinal canal stenosis. Mild bilateral neural foraminal stenosis. L1-L2: No spinal canal stenosis. Moderate right neural foraminal stenosis. No left neural foraminal stenosis. L2-L3: Circumferential disc bulge. Mild bilateral facet degenerative change. Mild spinal canal stenosis. No neural foraminal stenosis. L3-L4: Circumferential disc bulge. Mild bilateral facet degenerative change. Moderate left neural foraminal stenosis. No right neural foraminal stenosis. Mild spinal canal narrowing. L4-L5: Grade 1 anterolisthesis. Severe bilateral facet degenerative change. Severe right neural foraminal stenosis. Mild left neural foraminal stenosis. Mild spinal canal narrowing. L5-S1: No spinal canal or neural foraminal stenosis. IMPRESSION: Acute to subacute compression deformity at the L1 vertebral body level with posterior bulging of the cortex resulting in mild spinal canal stenosis at this level. There is diffuse T1 hypointense signal abnormality in the L1 vertebral body with involvement of  the bilateral pedicles, as well as the bilateral transverse processes. This raises concerns for a pathologic fracture. Recommend further evaluation with CT of the lumbar spine and correlate with history of malignancy. Electronically Signed   By: Marin Roberts M.D.   On: 02/10/2022 15:57            LOS: 1 day    Time spent: 50 minutes    Emeterio Reeve, DO [facility] 02/12/2022, 1:45 PM   Staff may message me via secure chat in [place]  but this may not receive immediate response,  please page for urgent matters!  If 7PM-7AM, please contact night-coverage [email or web address]  Dictation software was used to generate the above note. Typos may occur and escape review, as with typed/written notes. Please contact Dr Sheppard Coil directly for clarity if needed.

## 2022-02-12 NOTE — Plan of Care (Signed)
  Problem: Education: Goal: Ability to describe self-care measures that may prevent or decrease complications (Diabetes Survival Skills Education) will improve Outcome: Progressing   Problem: Coping: Goal: Ability to adjust to condition or change in health will improve Outcome: Progressing   Problem: Fluid Volume: Goal: Ability to maintain a balanced intake and output will improve Outcome: Progressing   Problem: Education: Goal: Knowledge of General Education information will improve Description: Including pain rating scale, medication(s)/side effects and non-pharmacologic comfort measures Outcome: Progressing   Problem: Tissue Perfusion: Goal: Adequacy of tissue perfusion will improve Outcome: Progressing   Problem: Skin Integrity: Goal: Risk for impaired skin integrity will decrease Outcome: Progressing   Problem: Nutrition: Goal: Adequate nutrition will be maintained Outcome: Progressing   Problem: Pain Managment: Goal: General experience of comfort will improve Outcome: Progressing   Problem: Safety: Goal: Ability to remain free from injury will improve Outcome: Progressing

## 2022-02-12 NOTE — Progress Notes (Signed)
Pt not able to tolerate TLSO Brace. Increase pain when having TLSO brace on. Notified attending MD and Neurosurgery MD.

## 2022-02-13 DIAGNOSIS — K219 Gastro-esophageal reflux disease without esophagitis: Secondary | ICD-10-CM | POA: Diagnosis not present

## 2022-02-13 DIAGNOSIS — Z7189 Other specified counseling: Secondary | ICD-10-CM | POA: Diagnosis not present

## 2022-02-13 DIAGNOSIS — E119 Type 2 diabetes mellitus without complications: Secondary | ICD-10-CM | POA: Diagnosis not present

## 2022-02-13 DIAGNOSIS — M8448XA Pathological fracture, other site, initial encounter for fracture: Secondary | ICD-10-CM | POA: Diagnosis not present

## 2022-02-13 LAB — HEMOGLOBIN AND HEMATOCRIT, BLOOD
HCT: 31.2 % — ABNORMAL LOW (ref 36.0–46.0)
Hemoglobin: 9.5 g/dL — ABNORMAL LOW (ref 12.0–15.0)

## 2022-02-13 LAB — GLUCOSE, CAPILLARY
Glucose-Capillary: 92 mg/dL (ref 70–99)
Glucose-Capillary: 96 mg/dL (ref 70–99)
Glucose-Capillary: 110 mg/dL — ABNORMAL HIGH (ref 70–99)
Glucose-Capillary: 129 mg/dL — ABNORMAL HIGH (ref 70–99)

## 2022-02-13 MED ORDER — CEFDINIR 300 MG PO CAPS
300.0000 mg | ORAL_CAPSULE | Freq: Two times a day (BID) | ORAL | Status: AC
  Administered 2022-02-13 – 2022-02-16 (×7): 300 mg via ORAL
  Filled 2022-02-13 (×8): qty 1

## 2022-02-13 MED ORDER — HYDROCODONE-ACETAMINOPHEN 5-325 MG PO TABS
2.0000 | ORAL_TABLET | Freq: Four times a day (QID) | ORAL | Status: DC
  Administered 2022-02-13 – 2022-02-14 (×7): 2 via ORAL
  Filled 2022-02-13 (×7): qty 2

## 2022-02-13 MED ORDER — IBUPROFEN 400 MG PO TABS
600.0000 mg | ORAL_TABLET | Freq: Four times a day (QID) | ORAL | Status: AC
  Administered 2022-02-13 – 2022-02-14 (×7): 600 mg via ORAL
  Filled 2022-02-13 (×7): qty 2

## 2022-02-13 NOTE — Evaluation (Signed)
Physical Therapy Evaluation Patient Details Name: Amber Glenn MRN: [id number] DOB: [birth year] Today's Date: 02/13/2022  History of Present Illness  Pt is a 71 y/o F admitted on 02/10/22 after presenting to the ED with c/o LBP that is not relieved with home medications. Pt found to have + UA. 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 Neurosurgery consulted with recommendation of TLSO as well as fixation when OR is available (unfortunately not until 11/6). PMH: HTN, HLD, NIDDM, depression, anxiety  Clinical Impression  Pt seen for PT evaluation with daughter Amber Glenn) in room. Pt & daughter required education re: therapy while in acute setting prior to surgery. PT educates pt on back precautions, use of TLSO, and log rolling but pt with poor recall throughout session. Pt is able to complete bed mobility with CGA & assistance/cuing to don/doff TLSO. Pt requires BUE support on RW to ambulate safely in room with min assist. Gait distances are limited by pain & fatigue & pt unsteady during ambulation. Pt would benefit from acute PT services to address balance, strengthening, activity tolerance, gait & stairs with LRAD.    Recommendations for follow up therapy are one component of a multi-disciplinary discharge planning process, led by the attending physician.  Recommendations may be updated based on patient status, additional functional criteria and insurance authorization.  Follow Up Recommendations Home health PT      Assistance Recommended at Discharge Intermittent Supervision/Assistance  Patient can return home with the following  A little help with walking and/or transfers;A little help with bathing/dressing/bathroom;Assistance with cooking/housework;Assist for transportation;Help with stairs or ramp  for entrance    Equipment Recommendations Rolling walker (2 wheels);BSC/3in1  Recommendations for Other Services  OT consult    Functional Status Assessment Patient has had a recent decline in their functional status and demonstrates the ability to make significant improvements in function in a reasonable and predictable amount of time.     Precautions / Restrictions Precautions Precautions: Fall;Back Required Braces or Orthoses: Spinal Brace Spinal Brace: Thoracolumbosacral orthotic;Applied in sitting position Restrictions Weight Bearing Restrictions: No      Mobility  Bed Mobility Overal bed mobility: Needs Assistance Bed Mobility: Rolling, Sidelying to Sit, Sit to Sidelying Rolling: Min guard Sidelying to sit: Min guard     Sit to sidelying: Supervision General bed mobility comments: bed flat, max cuing/education for log rolling technique    Transfers Overall transfer level: Needs assistance Equipment used: None, Rolling walker (2 wheels) Transfers: Sit to/from Stand Sit to Stand: Supervision           General transfer comment: STS from EOB with supervision, cuing for hand placement when transferring STS with RW    Ambulation/Gait Ambulation/Gait assistance: Min assist Gait Distance (Feet): 10 Feet (+ 10 ft) Assistive device: Rolling walker (2 wheels) Gait Pattern/deviations: Decreased step length - right, Decreased step length - left, Decreased stride length Gait velocity: decreased     General Gait Details: Pt unsteady when attempting to step without BUE support so provided pt with RW & pt ambulates to door & back twice with RW & min assist. Pt with 1 episode of LOB with min assist to correct. Decreased awareness of safe use of RW.  Stairs            Wheelchair Mobility    Modified Rankin (Stroke Patients Only)  Balance Overall balance assessment: Needs assistance Sitting-balance support: Feet supported, Bilateral upper extremity  supported Sitting balance-Leahy Scale: Fair     Standing balance support: Bilateral upper extremity supported, During functional activity, Reliant on assistive device for balance Standing balance-Leahy Scale: Fair                               Pertinent Vitals/Pain Pain Assessment Pain Assessment: 0-10 Pain Score: 2  (1-2/10 at rest, increases with movement but pt does not rate pain with activiyt) Pain Location: low back Pain Descriptors / Indicators: Aching, Discomfort Pain Intervention(s): Monitored during session, Limited activity within patient's tolerance    Home Living Family/patient expects to be discharged to:: Private residence Living Arrangements: Children Available Help at Discharge: Family;Available 24 hours/day Type of Home: House Home Access: Stairs to enter Entrance Stairs-Rails: Right Entrance Stairs-Number of Steps: 2   Home Layout: One level Home Equipment: None      Prior Function Prior Level of Function : Independent/Modified Independent;Working/employed;Driving             Mobility Comments: Pt was independent without AD, working, did require assistance to lift heavy items & rest breaks PRN. Reports 1 fall in the past 6 months.       Hand Dominance        Extremity/Trunk Assessment   Upper Extremity Assessment Upper Extremity Assessment: Overall WFL for tasks assessed    Lower Extremity Assessment Lower Extremity Assessment: Generalized weakness       Communication      Cognition Arousal/Alertness: Awake/alert Behavior During Therapy: WFL for tasks assessed/performed Overall Cognitive Status: Difficult to assess                                 General Comments: Pt is oriented to situation, location but poor STM as pt unable to recall back precautions/education that was provided multiple times during session. Appears to have some confusion but pt/family unable to clearly describe if this is completely new or  not (pt with hx of memory issues since having covid 2 years ago, but had UTI last month). Notified MD of confusion.        General Comments General comments (skin integrity, edema, etc.): PT educated pt on back precautions, donning/doffing TLSO, therapy while in hospital & recommendations upon d/c. Educated pt on need to ambulate with nursing assistance to the bathroom, as well as sit in recliner vs lying in bed throughout the day.    Exercises     Assessment/Plan    PT Assessment Patient needs continued PT services  PT Problem List Decreased strength;Pain;Decreased cognition;Decreased activity tolerance;Decreased balance;Decreased mobility;Decreased knowledge of precautions;Decreased safety awareness;Decreased knowledge of use of DME       PT Treatment Interventions DME instruction;Therapeutic exercise;Gait training;Balance training;Neuromuscular re-education;Stair training;Functional mobility training;Cognitive remediation;Therapeutic activities;Patient/family education;Manual techniques;Modalities    PT Goals (Current goals can be found in the Care Plan section)  Acute Rehab PT Goals Patient Stated Goal: have surgery PT Goal Formulation: With patient/family Time For Goal Achievement: 02/27/22 Potential to Achieve Goals: Good    Frequency 7X/week     Co-evaluation               AM-PAC PT "6 Clicks" Mobility  Outcome Measure Help needed turning from your back to your side while in a flat bed without using bedrails?: A Little Help needed moving from lying  on your back to sitting on the side of a flat bed without using bedrails?: A Little Help needed moving to and from a bed to a chair (including a wheelchair)?: A Little Help needed standing up from a chair using your arms (e.g., wheelchair or bedside chair)?: A Little Help needed to walk in hospital room?: A Little Help needed climbing 3-5 steps with a railing? : A Lot 6 Click Score: 17    End of Session Equipment  Utilized During Treatment: Back brace Activity Tolerance: Patient limited by pain;Patient limited by fatigue Patient left: in bed;with call bell/phone within reach;with bed alarm set;with family/visitor present Nurse Communication: Mobility status PT Visit Diagnosis: Unsteadiness on feet (R26.81);Muscle weakness (generalized) (M62.81);Pain Pain - Right/Left:  (low) Pain - part of body:  (back)    Time: 3085-6943 PT Time Calculation (min) (ACUTE ONLY): 23 min   Charges:   PT Evaluation $PT Eval Moderate Complexity: 1 Mod PT Treatments $Therapeutic Activity: 8-22 mins        Lavone Nian, PT, DPT 02/13/22, 9:32 AM   Waunita Schooner 02/13/2022, 9:29 AM

## 2022-02-13 NOTE — Plan of Care (Signed)
  Problem: Education: Goal: Ability to describe self-care measures that may prevent or decrease complications (Diabetes Survival Skills Education) will improve Outcome: Progressing   Problem: Coping: Goal: Ability to adjust to condition or change in health will improve Outcome: Progressing   Problem: Fluid Volume: Goal: Ability to maintain a balanced intake and output will improve Outcome: Progressing   Problem: Health Behavior/Discharge Planning: Goal: Ability to identify and utilize available resources and services will improve Outcome: Progressing   Problem: Nutritional: Goal: Maintenance of adequate nutrition will improve Outcome: Progressing   Problem: Nutritional: Goal: Progress toward achieving an optimal weight will improve Outcome: Progressing   Problem: Nutritional: Goal: Progress toward achieving an optimal weight will improve Outcome: Progressing   Problem: Skin Integrity: Goal: Risk for impaired skin integrity will decrease Outcome: Progressing   Problem: Tissue Perfusion: Goal: Adequacy of tissue perfusion will improve Outcome: Progressing   Problem: Coping: Goal: Level of anxiety will decrease Outcome: Progressing   Problem: Elimination: Goal: Will not experience complications related to bowel motility Outcome: Progressing   Problem: Pain Managment: Goal: General experience of comfort will improve Outcome: Progressing   Problem: Skin Integrity: Goal: Risk for impaired skin integrity will decrease Outcome: Progressing

## 2022-02-13 NOTE — Progress Notes (Signed)
PROGRESS NOTE    Amber Glenn   JOI:[id number] DOB: 22-Jul-1950  DOA: 02/10/2022 Date of Service: 02/13/22 PCP: Lynnell Jude, MD     Brief Narrative / Hospital Course:  [name] is a 71 year old female with history of hypertension, hyperlipidemia, non-insulin-dependent diabetes mellitus, depression, anxiety, who presents emergency department for chief concerns of low back pain not relieved with home pain medications. 11/01: in ED, afebrile, tachycardic 110 to 130 bpm, hypotensive 157/91, SPO2 100% on RA.  Unremarkable BMP, mild anemia Hgb 11.5 and MCV 79.  UA positive leukocytes, nitrate, with 11-20 WBC/hpf. Imaging: MRI lumbar spine: Acute/subacute compression deformity of L1, mild spinal canal stenosis due to posterior bulging cortex.  Diffuse T1 hypointense abnormality in the L1 vertebral body/pedicle/TP, raising concerns for pathologic fracture.  CT lumbar spine demonstrated tumor involvement of bilateral pedicles, right greater than left TP, right lamina with bony expansion at this level contributing to borderline impingement at T12/L1.  CT chest/abdomen/pelvis demonstrated 3.1 cm centrally necrotic mediastinal mass left prevascular region, scattered reticulonodular opacities in the lungs concerning for atypical infectious bronchiolitis, larger nodule in the right lateral costophrenic angle malignant/infectious, inflammatory pancreatic changes versus infiltrative pancreatic adenocarcinoma, pathologic lumbar spine fracture as noted above, mild acute sigmoid colon diverticulitis and tumor not excluded in this area. ED treatment: Keflex 500 mg p.o. one-time dose, fentanyl 50 mcg one-time dose, morphine one-time dose, Dilaudid 0.5 mg IV one-time dose, ondansetron. 11/02: US guided lymph node biopsy. Pain control has been difficult.  11/03: Neurosurgery planning for intervention on Monday (today is Friday). Tapering down on dilaudid.  11/04: stable. Lymph node pathology still pending as  expected. Pain controlled today.     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 Janese Banks following Pain control: Hydrocodone-APAP 5-325 mg two tabs q6h, ibuprofen 600 mg q6h, dilaudid IV 1 mg q4h prn severe pain, dilaudid 0.5 mg q4h prn moderate pain. Advised pt and family will plan weaning down as able to avoid dependence/complications. Bowel regimen ordered.  Per Onc: IR 11/02 biopsy mediastinal mass planning kyphoplasty w/ neurosurgery possibly Monday (today is Saturday)    Hypertension Losartan 100 mg daily resumed, carvedilol 3.125 mg p.o. twice daily resumed Hydralazine 5 mg IV every 8 hours.  For SBP greater than 180, 4 days ordered   Hyperlipidemia Simvastatin 40 mg daily resumed   Diabetes mellitus type 2, controlled ([facility]) Insulin SSI with at bedtime coverage ordered Pt has requested regular diet, I have obliged, will adjust insulin as needed   E Coli UTI D/c IV abx, started cefdinir today to finish course    DVT prophylaxis: heparin Pertinent IV fluids/nutrition: tolerating po, no continuous IV fluids  Central lines / invasive devices: none   Code Status: FULL CODE Family Communication: daughter at bedside on  rounds  Disposition:  inpatient  TOC needs: none at this time Barriers to discharge / significant pending items: pending biopsy resutls, procedure on Monday, and requiring IV pain control anticipate will need rehab/SNF and will be here through the weekend              Subjective:  Patient reports back pain is better, she is resting comfortably in bed. NO CP/SOB. She requests non-diabetic diet and no other complaints at thist ime        Objective:  Vitals:   02/12/22 0700 02/12/22 1659 02/12/22 2049 02/13/22 0746  BP: 102/60 127/71 (!) 94/49 (!) 125/52  Pulse: 74 (!) 106 99 100  Resp: 20 15 20 17   Temp: 98.3 F (36.8 C) 98.7 F (37.1 C) 97.6 F (36.4 C) (!) 97.4 F (36.3 C)  TempSrc: Oral     SpO2: 98% 98% 90% 93%  Weight:      Height:        Intake/Output Summary (Last 24 hours) at 02/13/2022 1239 Last data filed at 02/13/2022 1027 Gross per 24 hour  Intake 241.91 ml  Output --  Net 241.91 ml   Filed Weights   02/10/22 1120 02/10/22 2031  Weight: 59.4 kg 59.4 kg    Examination:  Constitutional:  VS as above General Appearance: alert, well-developed, well-nourished, NAD Respiratory: Normal respiratory effort No wheeze No rhonchi No rales Cardiovascular: S1/S2 normal No murmur No rub/gallop auscultated No lower extremity edema Gastrointestinal: No tenderness Musculoskeletal:  No clubbing/cyanosis of digits Symmetrical movement in all extremities Neurological: No cranial nerve deficit on limited exam Alert Psychiatric: Normal judgment/insight Normal mood and affect       Scheduled Medications:   vitamin C  1,000 mg Oral Daily   carvedilol  3.125 mg Oral BID WC   cefdinir  300 mg Oral Q12H   cyclobenzaprine  10 mg Oral TID   feeding supplement (GLUCERNA SHAKE)  237 mL Oral TID BM   gabapentin  100 mg Oral TID   heparin  5,000 Units Subcutaneous Q8H   HYDROcodone-acetaminophen  2 tablet Oral Q6H   HYDROcodone-acetaminophen  2  tablet Oral Q6H   ibuprofen  600 mg Oral Q6H   ibuprofen  600 mg Oral Q6H   influenza vaccine adjuvanted  0.5 mL Intramuscular Tomorrow-1000   insulin aspart  0-15 Units Subcutaneous TID WC   insulin aspart  0-5 Units Subcutaneous QHS   metFORMIN  500 mg Oral Q breakfast   multivitamin with minerals  1 tablet Oral Daily   pantoprazole  40 mg Oral Daily   polyethylene glycol  17 g Oral Daily   senna-docusate  2 tablet Oral BID   simvastatin  40 mg Oral q1800    Continuous Infusions:    PRN Medications:  alum & mag hydroxide-simeth, fentaNYL, hydrALAZINE, HYDROmorphone (DILAUDID) injection, lactulose, ondansetron **OR** ondansetron (ZOFRAN) IV  Antimicrobials:  Anti-infectives (From admission, onward)    Start     Dose/Rate Route Frequency Ordered Stop   02/13/22 1000  cefdinir (OMNICEF) capsule 300 mg        300 mg Oral Every 12 hours 02/13/22 0829 02/17/22 0959   02/11/22 1600  cefTRIAXone (ROCEPHIN) 1 g in sodium chloride 0.9 % 100 mL IVPB  Status:  Discontinued        1 g 200 mL/hr over 30 Minutes Intravenous Every 24 hours 02/11/22 1514 02/13/22 0829   02/10/22 1800  azithromycin (ZITHROMAX) tablet 500 mg        500 mg Oral  Once 02/10/22  1758 02/10/22 1826   02/10/22 1530  cephALEXin (KEFLEX) capsule 500 mg        500 mg Oral  Once 02/10/22 1523 02/10/22 1551       Data Reviewed: I have personally reviewed following labs and imaging studies  CBC: Recent Labs  Lab 02/10/22 1148 02/11/22 0322 02/12/22 0343 02/13/22 0516  WBC 9.7 6.9 8.1  --   NEUTROABS 8.3*  --   --   --   HGB 11.5* 10.5* 9.9* 9.5*  HCT 38.4 35.5* 32.6* 31.2*  MCV 79.2* 80.1 79.1*  --   PLT 265 273 281  --    Basic Metabolic Panel: Recent Labs  Lab 02/10/22 1148 02/11/22 0322 02/12/22 0343  NA 135 137 135  K 4.4 4.6 4.4  CL 101 102 100  CO2 28 27 28   GLUCOSE 140* 119* 103*  BUN 17 13 19   CREATININE 0.74 0.60 0.88  CALCIUM 9.7 9.6 9.6   GFR: Estimated Creatinine Clearance: 45.9  mL/min (by C-G formula based on SCr of 0.88 mg/dL). Liver Function Tests: No results for input(s): "AST", "ALT", "ALKPHOS", "BILITOT", "PROT", "ALBUMIN" in the last 168 hours. No results for input(s): "LIPASE", "AMYLASE" in the last 168 hours. No results for input(s): "AMMONIA" in the last 168 hours. Coagulation Profile: No results for input(s): "INR", "PROTIME" in the last 168 hours. Cardiac Enzymes: No results for input(s): "CKTOTAL", "CKMB", "CKMBINDEX", "TROPONINI" in the last 168 hours. BNP (last 3 results) No results for input(s): "PROBNP" in the last 8760 hours. HbA1C: Recent Labs    02/11/22 0322  HGBA1C 5.9*   CBG: Recent Labs  Lab 02/12/22 1201 02/12/22 1731 02/12/22 2130 02/13/22 0749 02/13/22 1225  GLUCAP 128* 118* 140* 92 96   Lipid Profile: No results for input(s): "CHOL", "HDL", "LDLCALC", "TRIG", "CHOLHDL", "LDLDIRECT" in the last 72 hours. Thyroid Function Tests: No results for input(s): "TSH", "T4TOTAL", "FREET4", "T3FREE", "THYROIDAB" in the last 72 hours. Anemia Panel: No results for input(s): "VITAMINB12", "FOLATE", "FERRITIN", "TIBC", "IRON", "RETICCTPCT" in the last 72 hours. Urine analysis:    Component Value Date/Time   COLORURINE YELLOW (A) 02/10/2022 1148   APPEARANCEUR HAZY (A) 02/10/2022 1148   LABSPEC 1.026 02/10/2022 1148   PHURINE 5.0 02/10/2022 1148   GLUCOSEU NEGATIVE 02/10/2022 1148   HGBUR NEGATIVE 02/10/2022 1148   BILIRUBINUR NEGATIVE 02/10/2022 1148   KETONESUR 20 (A) 02/10/2022 1148   PROTEINUR NEGATIVE 02/10/2022 1148   NITRITE POSITIVE (A) 02/10/2022 1148   LEUKOCYTESUR SMALL (A) 02/10/2022 1148   Sepsis Labs: @LABRCNTIP (procalcitonin:4,lacticidven:4)  Recent Results (from the past 240 hour(s))  Urine Culture     Status: Abnormal   Collection Time: 02/10/22 11:48 AM   Specimen: Urine, Clean Catch  Result Value Ref Range Status   Specimen Description   Final    URINE, CLEAN CATCH Performed at [facility],  [address], [place], [place] [address]    Special Requests   Final    NONE Performed at [facility], [address] [place], [place] [address]    Culture >=100,000 COLONIES/mL ESCHERICHIA COLI (A)  Final   Report Status 02/12/2022 FINAL  Final   Organism ID, Bacteria ESCHERICHIA COLI (A)  Final      Susceptibility   Escherichia coli - MIC*    AMPICILLIN >=32 RESISTANT Resistant     CEFAZOLIN <=4 SENSITIVE Sensitive     CEFEPIME <=0.12 SENSITIVE Sensitive     CEFTRIAXONE <=0.25 SENSITIVE Sensitive     CIPROFLOXACIN <=0.25 SENSITIVE Sensitive  GENTAMICIN <=1 SENSITIVE Sensitive     IMIPENEM <=0.25 SENSITIVE Sensitive     NITROFURANTOIN <=16 SENSITIVE Sensitive     TRIMETH/SULFA <=20 SENSITIVE Sensitive     AMPICILLIN/SULBACTAM 16 INTERMEDIATE Intermediate     PIP/TAZO <=4 SENSITIVE Sensitive     * >=100,000 COLONIES/mL ESCHERICHIA COLI         Radiology Studies: CT T-SPINE NO CHARGE  Result Date: 02/10/2022 CLINICAL DATA:  Pathologic fracture at L1 EXAM: CT THORACIC AND LUMBAR SPINE WITHOUT CONTRAST TECHNIQUE: Multidetector CT imaging of the thoracic and lumbar spine was performed without contrast. Multiplanar CT image reconstructions were also generated. RADIATION DOSE REDUCTION: This exam was performed according to the departmental dose-optimization program which includes automated exposure control, adjustment of the mA and/or kV according to patient size and/or use of iterative reconstruction technique. COMPARISON:  MRI lumbar spine 02/11/2012 FINDINGS: CT THORACIC SPINE FINDINGS Alignment: No vertebral subluxation is observed. Thoracic kyphosis noted. Vertebrae: No thoracic spine fracture or acute bony finding. There is multilevel bridging spurring anterior to the thoracic spine at the T2 through T10 levels. Paraspinal and other soft tissues: Please see dedicated CT chest report. Disc levels: No substantial thoracic spine impingement is noted. There is left  paracentral posterior intervertebral spurring at the T8-9 level but not causing central narrowing of the thecal sac. CT LUMBAR SPINE FINDINGS Segmentation: The lowest lumbar type non-rib-bearing vertebra is labeled as L5. Alignment: 5 mm of grade 1 degenerative anterolisthesis at L4-5. Vertebrae: Pathologic fracture at L1 with about 65% loss of vertebral body height, with the posterior contour of the vertebral body bulging back 6 mm in a manner highly suspicious for pathologic underlying tumor. The abnormal associated lucency in the vertebral body extends into both pedicles and into the transverse processes (right greater than left, with expansion of the pedicles and right transverse process. There is some mild extension into the right lamina on image 25 series 4. Mild degenerative endplate sclerosis at the L3-4 and L4-5 levels with loss of intervertebral disc height. No other lytic lesions are identified in the lumbar spine. Paraspinal and other soft tissues: Please see dedicated CT abdomen report. Disc levels: T12-L1: Borderline bilateral foraminal stenosis due to tumor expansion of the bilateral pedicles. Cannot exclude right subarticular lateral recess stenosis given the tumor expansion in this vicinity borderline central narrowing of the thecal sac due to posterior bony retropulsion/bulging. L1-2: No impingement. L2-3: No impingement.  Diffuse disc bulge. L3-4: No impingement.  Right eccentric disc bulge. L4-5: Moderate to prominent right and mild left foraminal stenosis and borderline central narrowing of the thecal sac due to subluxation, disc uncovering, and facet arthropathy. L5-S1: No impingement.  Central disc protrusion. IMPRESSION: 1. L1 pathologic fracture with 65% loss of vertebral body height and posterior bulging of the vertebral body by 0.6 cm. Tumor involvement of the bilateral pedicles, right greater than left transverse process, and right lamina. Bony expansion at this level contributes to  borderline impingement at T12-L1. No other osseous metastatic disease in the thoracolumbar spine identified. 2. Moderate to prominent impingement at L4-5 due to subluxation with disc uncovering and facet arthropathy. Electronically Signed   By: Van Clines M.D.   On: 02/10/2022 17:37   CT L-SPINE NO CHARGE  Result Date: 02/10/2022 CLINICAL DATA:  Pathologic fracture at L1 EXAM: CT THORACIC AND LUMBAR SPINE WITHOUT CONTRAST TECHNIQUE: Multidetector CT imaging of the thoracic and lumbar spine was performed without contrast. Multiplanar CT image reconstructions were also generated. RADIATION DOSE REDUCTION: This exam was  performed according to the departmental dose-optimization program which includes automated exposure control, adjustment of the mA and/or kV according to patient size and/or use of iterative reconstruction technique. COMPARISON:  MRI lumbar spine 02/11/2012 FINDINGS: CT THORACIC SPINE FINDINGS Alignment: No vertebral subluxation is observed. Thoracic kyphosis noted. Vertebrae: No thoracic spine fracture or acute bony finding. There is multilevel bridging spurring anterior to the thoracic spine at the T2 through T10 levels. Paraspinal and other soft tissues: Please see dedicated CT chest report. Disc levels: No substantial thoracic spine impingement is noted. There is left paracentral posterior intervertebral spurring at the T8-9 level but not causing central narrowing of the thecal sac. CT LUMBAR SPINE FINDINGS Segmentation: The lowest lumbar type non-rib-bearing vertebra is labeled as L5. Alignment: 5 mm of grade 1 degenerative anterolisthesis at L4-5. Vertebrae: Pathologic fracture at L1 with about 65% loss of vertebral body height, with the posterior contour of the vertebral body bulging back 6 mm in a manner highly suspicious for pathologic underlying tumor. The abnormal associated lucency in the vertebral body extends into both pedicles and into the transverse processes (right greater  than left, with expansion of the pedicles and right transverse process. There is some mild extension into the right lamina on image 25 series 4. Mild degenerative endplate sclerosis at the L3-4 and L4-5 levels with loss of intervertebral disc height. No other lytic lesions are identified in the lumbar spine. Paraspinal and other soft tissues: Please see dedicated CT abdomen report. Disc levels: T12-L1: Borderline bilateral foraminal stenosis due to tumor expansion of the bilateral pedicles. Cannot exclude right subarticular lateral recess stenosis given the tumor expansion in this vicinity borderline central narrowing of the thecal sac due to posterior bony retropulsion/bulging. L1-2: No impingement. L2-3: No impingement.  Diffuse disc bulge. L3-4: No impingement.  Right eccentric disc bulge. L4-5: Moderate to prominent right and mild left foraminal stenosis and borderline central narrowing of the thecal sac due to subluxation, disc uncovering, and facet arthropathy. L5-S1: No impingement.  Central disc protrusion. IMPRESSION: 1. L1 pathologic fracture with 65% loss of vertebral body height and posterior bulging of the vertebral body by 0.6 cm. Tumor involvement of the bilateral pedicles, right greater than left transverse process, and right lamina. Bony expansion at this level contributes to borderline impingement at T12-L1. No other osseous metastatic disease in the thoracolumbar spine identified. 2. Moderate to prominent impingement at L4-5 due to subluxation with disc uncovering and facet arthropathy. Electronically Signed   By: Van Clines M.D.   On: 02/10/2022 17:37   CT CHEST ABDOMEN PELVIS W CONTRAST  Result Date: 02/10/2022 CLINICAL DATA:  Occult malignancy, staging workup.  Severe pain. * Tracking Code: BO * EXAM: CT CHEST, ABDOMEN, AND PELVIS WITH CONTRAST TECHNIQUE: Multidetector CT imaging of the chest, abdomen and pelvis was performed following the standard protocol during bolus  administration of intravenous contrast. RADIATION DOSE REDUCTION: This exam was performed according to the departmental dose-optimization program which includes automated exposure control, adjustment of the mA and/or kV according to patient size and/or use of iterative reconstruction technique. CONTRAST:  167mL OMNIPAQUE IOHEXOL 300 MG/ML  SOLN COMPARISON:  Lumbar MRI 02/10/2022 and CT abdomen pelvis 01/02/2013 FINDINGS: CT CHEST FINDINGS Cardiovascular: Coronary, aortic arch, and branch vessel atherosclerotic vascular disease. Mediastinum/Nodes: Centrally necrotic 3.1 by 2.4 by 2.9 cm anterior mediastinal mass in the left prevascular region on image 17 series 2, with mild mass effect on the brachiocephalic vein as shown on image 16 series 2. This is in the  immediate vicinity of the phrenic nerve, and the elevation of the left hemidiaphragm is new compared to 08/11/2019 chest radiograph, raising suspicion for phrenic nerve impingement leading to diaphragmatic dysfunction. Left supraclavicular node, 1.0 cm in short axis on image 9 series 2. Right eccentric subcarinal node 1.0 cm in short axis on image 28 series 2. Lungs/Pleura: Scattered scarring and/or atelectasis in the left lower lobe and lingula. This is particularly notable anteriorly in the left lower lobe. Bronchiectasis and volume loss medially in the right middle lobe for example on image 98 series 3. Scattered tree-in-bud reticulonodular opacities are present in the lungs, right greater than left, characteristic for atypical infectious bronchiolitis. In light of the pathologic adenopathy in the chest this raises questions as to which of the visualized small nodules might be malignant versus infectious. One of the largest nodules is a 12 by 7 by 11 mm (volume = 500 mm^3) nodule in the right lateral costophrenic angle on image 107 of series 3, with clustered reticulonodular opacities just cephalad to this lesion. Peripheral atelectasis versus mild pleural  thickening posteriorly in the left lower lobe. Musculoskeletal: As noted above, the left hemidiaphragm is elevated, and the left anterior mediastinal mass which runs directly in the vicinity of the left phrenic nerve is implicated as a likely cause. Degenerative arthropathy at the sternoclavicular joints. Thoracic spondylosis. CT ABDOMEN PELVIS FINDINGS Hepatobiliary: Fluid density sharply defined 1.4 by 1.0 cm cyst posteriorly in segment 6 of the liver on image 59 series 2, this previously measured 0.6 cm in diameter back in 2014. Gallbladder unremarkable. No other liver lesion identified. Pancreas: Subtle abnormal hypoenhancement inferiorly in the pancreatic body measuring about 1.2 by 0.7 by 1.3 cm on image 38 of series 5. The pancreatic head is also mildly hypoenhancing. This could be inflammatory but infiltrative pancreatic adenocarcinoma cannot be excluded. This warrants detailed workup with either pancreatic protocol CT or MRI with and without contrast. No dorsal pancreatic duct dilatation identified. Spleen: Unremarkable Adrenals/Urinary Tract: Unremarkable Stomach/Bowel: Prominent stool throughout the colon favors constipation. Sigmoid colon diverticulosis is present and there is an inflamed diverticula and inflammatory findings the junction of the descending and sigmoid colon on image 31 series 5 suggesting mild acute diverticulitis. Tumor in this vicinity with local resulting inflammation is a differential diagnostic consideration given the findings elsewhere in today's examination. Vascular/Lymphatic: Atherosclerosis is present, including aortoiliac atherosclerotic disease. There is atheromatous calcification but only mild stenosis proximally in the superior mesenteric artery, with no findings of occlusion. No pathologic adenopathy observed in the abdomen/pelvis. Reproductive: Unremarkable Other: No supplemental non-categorized findings. Musculoskeletal: As shown on the lumbar spine MRI, there is a  pathologic fracture at L1 with diffuse abnormal lysis involving the vertebral body, bilateral pedicles, and bilateral transverse processes at this level with about 65% of vertebral body height and with a posterior bulging contour of the vertebral body by about 5-6 mm. Further detail on dedicated lumbar spine CT. IMPRESSION: 1. 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 There is also mild left supraclavicular adenopathy. 2. Scattered tree-in-bud reticulonodular opacities in the lungs, right greater than left, characteristic for atypical infectious bronchiolitis. One of the largest nodules is a 12 by 7 by 11 mm nodule in the right lateral costophrenic angle, which could be malignant or infectious. 3.  Subtle abnormal hypoenhancement inferiorly in the pancreatic body measuring about 1.2 by 0.7 by 1.3 cm, along with some indistinct hypoenhancement in the pancreatic head. This could be inflammatory but infiltrative pancreatic adenocarcinoma cannot be excluded. This warrants detailed workup with either pancreatic protocol CT or MRI with and without contrast. PET-CT could also be helpful in this case. 4. As shown on lumbar spine MRI, there is a pathologic (malignant) fracture at L1 with diffuse abnormal lysis involving the vertebral body, bilateral pedicles, and bilateral transverse processes at this level with about 65% of vertebral body height and with a posterior bulging contour of the vertebral body by about 5-6 mm. Further detail on dedicated lumbar spine CT. 5. Mild acute sigmoid colon diverticulitis. Tumor in the vicinity of the local inflammation along the sigmoid colon is not totally excluded but is a less likely differential diagnostic consideration. 6.  Aortic atherosclerosis.  Coronary atherosclerosis. Aortic Atherosclerosis (ICD10-I70.0). Electronically Signed   By: Van Clines M.D.   On: 02/10/2022 17:24   MR LUMBAR SPINE WO CONTRAST  Result Date: 02/10/2022 CLINICAL DATA:  Known compression fracture at L1. worsening pain EXAM: MRI LUMBAR SPINE WITHOUT CONTRAST TECHNIQUE: Multiplanar, multisequence MR imaging of the lumbar spine was performed. No intravenous contrast was administered. COMPARISON:  None Available. FINDINGS: Segmentation:  Standard. Alignment: Trace retrolisthesis of L3 on L4. Grade 1 anterolisthesis of L4 on L5. Vertebrae: There is a acute to subacute compression deformity at the L1 vertebral body level with posterior bulging of the cortex. This results in mild spinal canal narrowing at this level. There is diffuse T1 hypointense signal abnormality in the L1 vertebral body level which involve the bilateral pedicles, as well as the bilateral transverse processes (series 9, image 11). Cortex, particularly at the right transverse process is poorly visualized. Conus medullaris and cauda equina: Conus extends to the L1-L2 level. Conus and cauda equina appear normal. 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 There is atrophy of the paraspinal musculature. No retroperitoneal lymphadenopathy. Bilateral adrenal glands are normal in appearance. Disc levels: T11-T12: No evidence of spinal canal or neural foraminal stenosis. T12-L1: Mild spinal canal stenosis. Mild bilateral neural foraminal stenosis. L1-L2: No spinal canal stenosis. Moderate right neural foraminal stenosis. No left neural foraminal stenosis. L2-L3: Circumferential disc bulge. Mild bilateral facet degenerative change. Mild spinal canal stenosis. No neural foraminal stenosis. L3-L4: Circumferential disc bulge. Mild bilateral facet degenerative change. Moderate left neural foraminal stenosis.  No right neural foraminal stenosis. Mild spinal canal narrowing. L4-L5: Grade 1 anterolisthesis. Severe bilateral facet degenerative change. Severe right neural foraminal stenosis. Mild left neural foraminal stenosis. Mild spinal canal narrowing. L5-S1: No spinal canal or neural foraminal stenosis. IMPRESSION: Acute to subacute compression deformity at the L1 vertebral body level with posterior bulging of the cortex resulting in mild spinal canal stenosis at this level. There is diffuse T1 hypointense signal abnormality in the L1 vertebral body with involvement of the bilateral pedicles, as well as the bilateral transverse processes. This raises concerns for a pathologic fracture. Recommend further evaluation with CT of the lumbar spine and correlate with history of malignancy. Electronically Signed   By: Marin Roberts M.D.   On: 02/10/2022 15:57            LOS: 2 days    Time spent: 50 minutes    Emeterio Reeve, DO [facility] 02/13/2022, 12:39 PM   Staff may message me via  secure chat in [place]  but this may not receive immediate response,  please page for urgent matters!  If 7PM-7AM, please contact night-coverage [email or web address]  Dictation software was used to generate the above note. Typos may occur and escape review, as with typed/written notes. Please contact Dr Sheppard Coil directly for clarity if needed.

## 2022-02-14 DIAGNOSIS — M8448XA Pathological fracture, other site, initial encounter for fracture: Secondary | ICD-10-CM | POA: Diagnosis not present

## 2022-02-14 DIAGNOSIS — M8458XA Pathological fracture in neoplastic disease, other specified site, initial encounter for fracture: Secondary | ICD-10-CM | POA: Diagnosis not present

## 2022-02-14 DIAGNOSIS — M532X6 Spinal instabilities, lumbar region: Secondary | ICD-10-CM | POA: Diagnosis not present

## 2022-02-14 LAB — GLUCOSE, CAPILLARY
Glucose-Capillary: 90 mg/dL (ref 70–99)
Glucose-Capillary: 116 mg/dL — ABNORMAL HIGH (ref 70–99)
Glucose-Capillary: 111 mg/dL — ABNORMAL HIGH (ref 70–99)
Glucose-Capillary: 110 mg/dL — ABNORMAL HIGH (ref 70–99)

## 2022-02-14 LAB — SURGICAL PCR SCREEN
MRSA, PCR: NEGATIVE
Staphylococcus aureus: NEGATIVE

## 2022-02-14 NOTE — Evaluation (Signed)
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 Pt limited by pain, has been pre medicated, agreeable to OT evaluation. PTA pt is indep in ADL/IADL. Pt is mildly impulsive throughout evaluation requiring frequent vcs for safety however carry over noted of education throughout evaluation. Pt presents with deficits in strength, endurance, activity tolerance, balance all affecting safe and optimal ADL completion. Recommend discharge home with HHOT to address functional deficits. OT will continue to follow acutely.      Recommendations for follow up therapy are one component of a multi-disciplinary discharge planning process, led by the attending physician.  Recommendations may be updated based on patient status, additional functional criteria and insurance authorization.   Follow Up Recommendations  Home health OT    Assistance Recommended at Discharge Frequent or constant Supervision/Assistance  Patient can return home with the following A little help with walking and/or transfers;A little help with bathing/dressing/bathroom    Functional Status Assessment  Patient has had a recent decline in their functional status and demonstrates the ability to make significant improvements in function in a reasonable and predictable amount of time.  Equipment Recommendations  BSC/3in1     Recommendations for Other Services       Precautions / Restrictions Precautions Precautions: Fall;Back Required Braces or Orthoses: Spinal Brace Spinal Brace: Thoracolumbosacral orthotic Restrictions Weight Bearing Restrictions: No      Mobility Bed Mobility Overal bed mobility: Needs Assistance Bed Mobility: Sidelying to Sit, Sit to Sidelying, Rolling Rolling: Min assist Sidelying to sit: Min assist     Sit to sidelying: Min assist      Transfers Overall transfer level: Needs assistance Equipment used: Rolling walker (2 wheels) Transfers: Sit to/from Stand Sit to Stand: Min guard                  Balance Overall balance assessment: Needs assistance Sitting-balance support: Feet supported, Bilateral upper extremity supported Sitting balance-Leahy Scale: Good     Standing balance support: Bilateral upper extremity supported, During functional activity, Reliant on assistive device for balance Standing balance-Leahy Scale: Fair                             ADL either performed or assessed with clinical judgement   ADL Overall ADL's : Needs assistance/impaired Eating/Feeding: Set up;Sitting   Grooming: Set up;Sitting;Standing Grooming Details (indicate cue type and reason): vcs for rest breaks             Lower Body Dressing: Maximal assistance   Toilet Transfer: Min guard;Rolling walker (2 wheels) Toilet Transfer Details (indicate cue type and reason): simulated, without RW pt with posterior LOB requring MIN A to correct         Functional mobility during ADLs: Min guard;Cueing for safety;Rolling walker (2 wheels)       Vision Patient Visual Report: No change from baseline       Perception     Praxis      Pertinent Vitals/Pain  Pain Assessment Pain Assessment: 0-10 Pain Score: 10-Worst pain ever Pain Location: R hip Pain Descriptors / Indicators: Discomfort, Grimacing, Guarding Pain Intervention(s): Limited activity within  patient's tolerance, Monitored during session, Patient requesting pain meds-RN notified, Premedicated before session     Hand Dominance     Extremity/Trunk Assessment Upper Extremity Assessment Upper Extremity Assessment: Overall WFL for tasks assessed   Lower Extremity Assessment Lower Extremity Assessment: Generalized weakness       Communication Communication Communication: No difficulties   Cognition Arousal/Alertness: Awake/alert Behavior During Therapy: Impulsive Overall Cognitive Status: Within Functional Limits for tasks assessed Area of Impairment: Attention, Safety/judgement, Awareness, Problem solving                   Current Attention Level: Selective     Safety/Judgement: Decreased awareness of deficits Awareness: Emergent Problem Solving: Requires verbal cues, Requires tactile cues General Comments: Mildly impulsive with frequent vcs required throughout for safety, pt with good carry over following cueing     General Comments       Exercises Other Exercises Other Exercises: educated pt and daugther re: role of OT, role of rehab, discharge recommendations, falls prevention, tlso use, precautions, energy conservation, importance of continued mobility   Shoulder Instructions      Home Living Family/patient expects to be discharged to:: Private residence Living Arrangements: Children Available Help at Discharge: Family;Available 24 hours/day Type of Home: House Home Access: Stairs to enter [employer] of Steps: 2 Entrance Stairs-Rails: Right Home Layout: One level               Home Equipment: None          Prior Functioning/Environment Prior Level of Function : Independent/Modified Independent;Working/employed;Driving                        OT Problem List: Decreased strength;Decreased activity tolerance;Decreased knowledge of use of DME or AE;Decreased safety awareness      OT Treatment/Interventions:  Self-care/ADL training;Patient/family education;Therapeutic exercise;Balance training;Therapeutic activities;Energy conservation;DME and/or AE instruction    OT Goals(Current goals can be found in the care plan section) Acute Rehab OT Goals Patient Stated Goal: decrease pain OT Goal Formulation: With patient Time For Goal Achievement: 02/28/22 Potential to Achieve Goals: Good ADL Goals Pt Will Perform Grooming: with modified independence Pt Will Perform Lower Body Dressing: with modified independence;with adaptive equipment Pt Will Transfer to Toilet: with modified independence Pt Will Perform Toileting - Clothing Manipulation and hygiene: with modified independence  OT Frequency: Min 2X/week    Co-evaluation              AM-PAC OT "6 Clicks" Daily Activity     Outcome Measure Help from another person eating meals?: None Help from another person taking care of personal grooming?: None Help from another person toileting, which includes using toliet, bedpan, or urinal?: A Little Help from another person bathing (including washing, rinsing, drying)?: A Little Help from another person to put on and taking off regular upper body clothing?: None Help from another person to put on and taking off regular lower body clothing?: A Lot 6 Click Score: 20   End of Session Equipment Utilized During Treatment: Rolling walker (2 wheels) Nurse Communication: Mobility status  Activity Tolerance: Patient limited by pain Patient left: in bed;with call bell/phone within reach;with family/visitor present  OT Visit Diagnosis: Unsteadiness on feet (R26.81);Muscle weakness (generalized) (M62.81)  Time: 9407-6808 OT Time Calculation (min): 15 min Charges:  OT General Charges $OT Visit: 1 Visit OT Evaluation $OT Eval Moderate Complexity: 1 Mod  Shanon Payor, OTD OTR/L  02/14/22, 11:29 AM

## 2022-02-14 NOTE — Progress Notes (Addendum)
PROGRESS NOTE    Amber Glenn   BUL:[id number] DOB: 1950/07/25  DOA: 02/10/2022 Date of Service: 02/14/22 PCP: Lynnell Jude, MD     Brief Narrative / Hospital Course:  [name] is a 71 year old female with history of hypertension, hyperlipidemia, non-insulin-dependent diabetes mellitus, depression, anxiety, who presents emergency department for chief concerns of low back pain not relieved with home pain medications. 11/01: in ED, afebrile, tachycardic 110 to 130 bpm, hypotensive 157/91, SPO2 100% on RA.  Unremarkable BMP, mild anemia Hgb 11.5 and MCV 79.  UA positive leukocytes, nitrate, with 11-20 WBC/hpf. Imaging: MRI lumbar spine: Acute/subacute compression deformity of L1, mild spinal canal stenosis due to posterior bulging cortex.  Diffuse T1 hypointense abnormality in the L1 vertebral body/pedicle/TP, raising concerns for pathologic fracture.  CT lumbar spine demonstrated tumor involvement of bilateral pedicles, right greater than left TP, right lamina with bony expansion at this level contributing to borderline impingement at T12/L1.  CT chest/abdomen/pelvis demonstrated 3.1 cm centrally necrotic mediastinal mass left prevascular region, scattered reticulonodular opacities in the lungs concerning for atypical infectious bronchiolitis, larger nodule in the right lateral costophrenic angle malignant/infectious, inflammatory pancreatic changes versus infiltrative pancreatic adenocarcinoma, pathologic lumbar spine fracture as noted above, mild acute sigmoid colon diverticulitis and tumor not excluded in this area. ED treatment: Keflex 500 mg p.o. one-time dose, fentanyl 50 mcg one-time dose, morphine one-time dose, Dilaudid 0.5 mg IV one-time dose, ondansetron. 11/02: US guided lymph node biopsy. Pain control has been difficult.  11/03: Neurosurgery planning for intervention on Monday (today is Friday). Tapering down on dilaudid.  11/04-11/05: stable. Lymph node pathology still  pending as expected. Pain controlled today.     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 Janese Banks following Pain control: Hydrocodone-APAP 5-325 mg two tabs q6h, ibuprofen 600 mg q6h, dilaudid IV 1 mg q4h prn severe pain, dilaudid 0.5 mg q4h prn moderate pain. Advised pt and family will plan weaning down as able to avoid dependence/complications. Bowel regimen ordered.  Per Onc: IR 11/02 biopsy mediastinal mass planning kyphoplasty w/ neurosurgery tomorrow    Hypertension Losartan 100 mg daily resumed, carvedilol 3.125 mg p.o. twice daily resumed Hydralazine 5 mg IV every 8 hours.  For SBP greater than 180, 4 days ordered   Hyperlipidemia Simvastatin 40 mg daily resumed   Diabetes mellitus type 2, controlled ([facility]) Insulin SSI with at bedtime coverage ordered Pt has requested regular diet, I have obliged, will adjust insulin as needed   E Coli UTI D/c IV abx, started cefdinir to finish course    DVT prophylaxis: heparin Pertinent IV fluids/nutrition: tolerating po, no continuous IV fluids  Central lines / invasive devices: none   Code Status: FULL CODE Family Communication: daughter at bedside on rounds  Disposition: inpatient  TOC  needs: none  at this time Barriers to discharge / significant pending items: pending biopsy resutls, procedure on Monday, and requiring IV pain control anticipate will need rehab/SNF and will be here through the weekend              Subjective:  Patient reports back pain is okay as long as she's lying down resting, bothers her w/ PT. No other complaints today.        Objective:  Vitals:   02/13/22 1720 02/14/22 0021 02/14/22 0834 02/14/22 1134  BP: 117/64 (!) 135/56 110/60 134/69  Pulse: 100 93 94 (!) 103  Resp: 17 17 17 18   Temp: 97.6 F (36.4 C) 97.6 F (36.4 C) (!) 97.5 F (36.4 C)   TempSrc:      SpO2: 95% 96% 95%   Weight:      Height:        Intake/Output Summary (Last 24 hours) at 02/14/2022 1218 Last data filed at 02/13/2022 1800 Gross per 24 hour  Intake 480 ml  Output --  Net 480 ml   Filed Weights   02/10/22 1120 02/10/22 2031  Weight: 59.4 kg 59.4 kg    Examination:  Constitutional:  VS as above General Appearance: alert, well-developed, well-nourished, NAD Respiratory: Normal respiratory effort Cardiovascular: S1/S2 normal No murmur Gastrointestinal: No tenderness Musculoskeletal:  No clubbing/cyanosis of digits Symmetrical movement in all extremities Neurological: No cranial nerve deficit on limited exam Alert Psychiatric: Normal judgment/insight Normal mood and affect       Scheduled Medications:   vitamin C  1,000 mg Oral Daily   carvedilol  3.125 mg Oral BID WC   cefdinir  300 mg Oral Q12H   cyclobenzaprine  10 mg Oral TID   feeding supplement (GLUCERNA SHAKE)  237 mL Oral TID BM   gabapentin  100 mg Oral TID   heparin  5,000 Units Subcutaneous Q8H   HYDROcodone-acetaminophen  2 tablet Oral Q6H   ibuprofen  600 mg Oral Q6H   influenza vaccine adjuvanted  0.5 mL Intramuscular Tomorrow-1000   insulin aspart  0-15 Units Subcutaneous TID WC   insulin aspart  0-5 Units Subcutaneous QHS   metFORMIN  500 mg Oral Q  breakfast   multivitamin with minerals  1 tablet Oral Daily   pantoprazole  40 mg Oral Daily   polyethylene glycol  17 g Oral Daily   senna-docusate  2 tablet Oral BID   simvastatin  40 mg Oral q1800    Continuous Infusions:    PRN Medications:  alum & mag hydroxide-simeth, fentaNYL, hydrALAZINE, HYDROmorphone (DILAUDID) injection, lactulose, ondansetron **OR** ondansetron (ZOFRAN) IV  Antimicrobials:  Anti-infectives (From admission, onward)    Start     Dose/Rate Route Frequency Ordered Stop   02/13/22 1000  cefdinir (OMNICEF) capsule 300 mg        300 mg Oral Every 12 hours 02/13/22 0829 02/17/22 0959   02/11/22 1600  cefTRIAXone (ROCEPHIN) 1 g in sodium chloride 0.9 % 100 mL IVPB  Status:  Discontinued        1 g 200 mL/hr over 30 Minutes Intravenous Every 24 hours 02/11/22 1514 02/13/22 0829   02/10/22 1800  azithromycin (ZITHROMAX) tablet 500 mg        500 mg Oral  Once 02/10/22 1758 02/10/22 1826   02/10/22 1530  cephALEXin (KEFLEX) capsule 500 mg        500 mg Oral  Once 02/10/22 1523 02/10/22 1551       Data Reviewed: I have personally reviewed following  labs and imaging studies  CBC: Recent Labs  Lab 02/10/22 1148 02/11/22 0322 02/12/22 0343 02/13/22 0516  WBC 9.7 6.9 8.1  --   NEUTROABS 8.3*  --   --   --   HGB 11.5* 10.5* 9.9* 9.5*  HCT 38.4 35.5* 32.6* 31.2*  MCV 79.2* 80.1 79.1*  --   PLT 265 273 281  --    Basic Metabolic Panel: Recent Labs  Lab 02/10/22 1148 02/11/22 0322 02/12/22 0343  NA 135 137 135  K 4.4 4.6 4.4  CL 101 102 100  CO2 28 27 28   GLUCOSE 140* 119* 103*  BUN 17 13 19   CREATININE 0.74 0.60 0.88  CALCIUM 9.7 9.6 9.6   GFR: Estimated Creatinine Clearance: 45.9 mL/min (by C-G formula based on SCr of 0.88 mg/dL). Liver Function Tests: No results for input(s): "AST", "ALT", "ALKPHOS", "BILITOT", "PROT", "ALBUMIN" in the last 168 hours. No results for input(s): "LIPASE", "AMYLASE" in the last 168 hours. No results for  input(s): "AMMONIA" in the last 168 hours. Coagulation Profile: No results for input(s): "INR", "PROTIME" in the last 168 hours. Cardiac Enzymes: No results for input(s): "CKTOTAL", "CKMB", "CKMBINDEX", "TROPONINI" in the last 168 hours. BNP (last 3 results) No results for input(s): "PROBNP" in the last 8760 hours. HbA1C: No results for input(s): "HGBA1C" in the last 72 hours.  CBG: Recent Labs  Lab 02/13/22 1225 02/13/22 1718 02/13/22 2105 02/14/22 0837 02/14/22 1130  GLUCAP 96 110* 129* 90 116*   Lipid Profile: No results for input(s): "CHOL", "HDL", "LDLCALC", "TRIG", "CHOLHDL", "LDLDIRECT" in the last 72 hours. Thyroid Function Tests: No results for input(s): "TSH", "T4TOTAL", "FREET4", "T3FREE", "THYROIDAB" in the last 72 hours. Anemia Panel: No results for input(s): "VITAMINB12", "FOLATE", "FERRITIN", "TIBC", "IRON", "RETICCTPCT" in the last 72 hours. Urine analysis:    Component Value Date/Time   COLORURINE YELLOW (A) 02/10/2022 1148   APPEARANCEUR HAZY (A) 02/10/2022 1148   LABSPEC 1.026 02/10/2022 1148   PHURINE 5.0 02/10/2022 1148   GLUCOSEU NEGATIVE 02/10/2022 1148   HGBUR NEGATIVE 02/10/2022 1148   BILIRUBINUR NEGATIVE 02/10/2022 1148   KETONESUR 20 (A) 02/10/2022 1148   PROTEINUR NEGATIVE 02/10/2022 1148   NITRITE POSITIVE (A) 02/10/2022 1148   LEUKOCYTESUR SMALL (A) 02/10/2022 1148   Sepsis Labs: @LABRCNTIP (procalcitonin:4,lacticidven:4)  Recent Results (from the past 240 hour(s))  Urine Culture     Status: Abnormal   Collection Time: 02/10/22 11:48 AM   Specimen: Urine, Clean Catch  Result Value Ref Range Status   Specimen Description   Final    URINE, CLEAN CATCH Performed at [facility], [address], [place], [place] [address]    Special Requests   Final    NONE Performed at [facility], [address] [place], [place] [address]    Culture >=100,000 COLONIES/mL ESCHERICHIA COLI (A)  Final   Report Status 02/12/2022  FINAL  Final   Organism ID, Bacteria ESCHERICHIA COLI (A)  Final      Susceptibility   Escherichia coli - MIC*    AMPICILLIN >=32 RESISTANT Resistant     CEFAZOLIN <=4 SENSITIVE Sensitive     CEFEPIME <=0.12 SENSITIVE Sensitive     CEFTRIAXONE <=0.25 SENSITIVE Sensitive     CIPROFLOXACIN <=0.25 SENSITIVE Sensitive     GENTAMICIN <=1 SENSITIVE Sensitive     IMIPENEM <=0.25 SENSITIVE Sensitive     NITROFURANTOIN <=16 SENSITIVE Sensitive     TRIMETH/SULFA <=20 SENSITIVE Sensitive     AMPICILLIN/SULBACTAM 16 INTERMEDIATE Intermediate  PIP/TAZO <=4 SENSITIVE Sensitive     * >=100,000 COLONIES/mL ESCHERICHIA COLI         Radiology Studies: CT T-SPINE NO CHARGE  Result Date: 02/10/2022 CLINICAL DATA:  Pathologic fracture at L1 EXAM: CT THORACIC AND LUMBAR SPINE WITHOUT CONTRAST TECHNIQUE: Multidetector CT imaging of the thoracic and lumbar spine was performed without contrast. Multiplanar CT image reconstructions were also generated. RADIATION DOSE REDUCTION: This exam was performed according to the departmental dose-optimization program which includes automated exposure control, adjustment of the mA and/or kV according to patient size and/or use of iterative reconstruction technique. COMPARISON:  MRI lumbar spine 02/11/2012 FINDINGS: CT THORACIC SPINE FINDINGS Alignment: No vertebral subluxation is observed. Thoracic kyphosis noted. Vertebrae: No thoracic spine fracture or acute bony finding. There is multilevel bridging spurring anterior to the thoracic spine at the T2 through T10 levels. Paraspinal and other soft tissues: Please see dedicated CT chest report. Disc levels: No substantial thoracic spine impingement is noted. There is left paracentral posterior intervertebral spurring at the T8-9 level but not causing central narrowing of the thecal sac. CT LUMBAR SPINE FINDINGS Segmentation: The lowest lumbar type non-rib-bearing vertebra is labeled as L5. Alignment: 5 mm of grade 1  degenerative anterolisthesis at L4-5. Vertebrae: Pathologic fracture at L1 with about 65% loss of vertebral body height, with the posterior contour of the vertebral body bulging back 6 mm in a manner highly suspicious for pathologic underlying tumor. The abnormal associated lucency in the vertebral body extends into both pedicles and into the transverse processes (right greater than left, with expansion of the pedicles and right transverse process. There is some mild extension into the right lamina on image 25 series 4. Mild degenerative endplate sclerosis at the L3-4 and L4-5 levels with loss of intervertebral disc height. No other lytic lesions are identified in the lumbar spine. Paraspinal and other soft tissues: Please see dedicated CT abdomen report. Disc levels: T12-L1: Borderline bilateral foraminal stenosis due to tumor expansion of the bilateral pedicles. Cannot exclude right subarticular lateral recess stenosis given the tumor expansion in this vicinity borderline central narrowing of the thecal sac due to posterior bony retropulsion/bulging. L1-2: No impingement. L2-3: No impingement.  Diffuse disc bulge. L3-4: No impingement.  Right eccentric disc bulge. L4-5: Moderate to prominent right and mild left foraminal stenosis and borderline central narrowing of the thecal sac due to subluxation, disc uncovering, and facet arthropathy. L5-S1: No impingement.  Central disc protrusion. IMPRESSION: 1. L1 pathologic fracture with 65% loss of vertebral body height and posterior bulging of the vertebral body by 0.6 cm. Tumor involvement of the bilateral pedicles, right greater than left transverse process, and right lamina. Bony expansion at this level contributes to borderline impingement at T12-L1. No other osseous metastatic disease in the thoracolumbar spine identified. 2. Moderate to prominent impingement at L4-5 due to subluxation with disc uncovering and facet arthropathy. Electronically Signed   By: Van Clines M.D.   On: 02/10/2022 17:37   CT L-SPINE NO CHARGE  Result Date: 02/10/2022 CLINICAL DATA:  Pathologic fracture at L1 EXAM: CT THORACIC AND LUMBAR SPINE WITHOUT CONTRAST TECHNIQUE: Multidetector CT imaging of the thoracic and lumbar spine was performed without contrast. Multiplanar CT image reconstructions were also generated. RADIATION DOSE REDUCTION: This exam was performed according to the departmental dose-optimization program which includes automated exposure control, adjustment of the mA and/or kV according to patient size and/or use of iterative reconstruction technique. COMPARISON:  MRI lumbar spine 02/11/2012 FINDINGS: CT THORACIC SPINE FINDINGS Alignment:  No vertebral subluxation is observed. Thoracic kyphosis noted. Vertebrae: No thoracic spine fracture or acute bony finding. There is multilevel bridging spurring anterior to the thoracic spine at the T2 through T10 levels. Paraspinal and other soft tissues: Please see dedicated CT chest report. Disc levels: No substantial thoracic spine impingement is noted. There is left paracentral posterior intervertebral spurring at the T8-9 level but not causing central narrowing of the thecal sac. CT LUMBAR SPINE FINDINGS Segmentation: The lowest lumbar type non-rib-bearing vertebra is labeled as L5. Alignment: 5 mm of grade 1 degenerative anterolisthesis at L4-5. Vertebrae: Pathologic fracture at L1 with about 65% loss of vertebral body height, with the posterior contour of the vertebral body bulging back 6 mm in a manner highly suspicious for pathologic underlying tumor. The abnormal associated lucency in the vertebral body extends into both pedicles and into the transverse processes (right greater than left, with expansion of the pedicles and right transverse process. There is some mild extension into the right lamina on image 25 series 4. Mild degenerative endplate sclerosis at the L3-4 and L4-5 levels with loss of intervertebral disc height. No  other lytic lesions are identified in the lumbar spine. Paraspinal and other soft tissues: Please see dedicated CT abdomen report. Disc levels: T12-L1: Borderline bilateral foraminal stenosis due to tumor expansion of the bilateral pedicles. Cannot exclude right subarticular lateral recess stenosis given the tumor expansion in this vicinity borderline central narrowing of the thecal sac due to posterior bony retropulsion/bulging. L1-2: No impingement. L2-3: No impingement.  Diffuse disc bulge. L3-4: No impingement.  Right eccentric disc bulge. L4-5: Moderate to prominent right and mild left foraminal stenosis and borderline central narrowing of the thecal sac due to subluxation, disc uncovering, and facet arthropathy. L5-S1: No impingement.  Central disc protrusion. IMPRESSION: 1. L1 pathologic fracture with 65% loss of vertebral body height and posterior bulging of the vertebral body by 0.6 cm. Tumor involvement of the bilateral pedicles, right greater than left transverse process, and right lamina. Bony expansion at this level contributes to borderline impingement at T12-L1. No other osseous metastatic disease in the thoracolumbar spine identified. 2. Moderate to prominent impingement at L4-5 due to subluxation with disc uncovering and facet arthropathy. Electronically Signed   By: Van Clines M.D.   On: 02/10/2022 17:37   CT CHEST ABDOMEN PELVIS W CONTRAST  Result Date: 02/10/2022 CLINICAL DATA:  Occult malignancy, staging workup.  Severe pain. * Tracking Code: BO * EXAM: CT CHEST, ABDOMEN, AND PELVIS WITH CONTRAST TECHNIQUE: Multidetector CT imaging of the chest, abdomen and pelvis was performed following the standard protocol during bolus administration of intravenous contrast. RADIATION DOSE REDUCTION: This exam was performed according to the departmental dose-optimization program which includes automated exposure control, adjustment of the mA and/or kV according to patient size and/or use of  iterative reconstruction technique. CONTRAST:  116mL OMNIPAQUE IOHEXOL 300 MG/ML  SOLN COMPARISON:  Lumbar MRI 02/10/2022 and CT abdomen pelvis 01/02/2013 FINDINGS: CT CHEST FINDINGS Cardiovascular: Coronary, aortic arch, and branch vessel atherosclerotic vascular disease. Mediastinum/Nodes: Centrally necrotic 3.1 by 2.4 by 2.9 cm anterior mediastinal mass in the left prevascular region on image 17 series 2, with mild mass effect on the brachiocephalic vein as shown on image 16 series 2. This is in the immediate vicinity of the phrenic nerve, and the elevation of the left hemidiaphragm is new compared to 08/11/2019 chest radiograph, raising suspicion for phrenic nerve impingement leading to diaphragmatic dysfunction. Left supraclavicular node, 1.0 cm in short axis on image  9 series 2. Right eccentric subcarinal node 1.0 cm in short axis on image 28 series 2. Lungs/Pleura: Scattered scarring and/or atelectasis in the left lower lobe and lingula. This is particularly notable anteriorly in the left lower lobe. Bronchiectasis and volume loss medially in the right middle lobe for example on image 98 series 3. Scattered tree-in-bud reticulonodular opacities are present in the lungs, right greater than left, characteristic for atypical infectious bronchiolitis. In light of the pathologic adenopathy in the chest this raises questions as to which of the visualized small nodules might be malignant versus infectious. One of the largest nodules is a 12 by 7 by 11 mm (volume = 500 mm^3) nodule in the right lateral costophrenic angle on image 107 of series 3, with clustered reticulonodular opacities just cephalad to this lesion. Peripheral atelectasis versus mild pleural thickening posteriorly in the left lower lobe. Musculoskeletal: As noted above, the left hemidiaphragm is elevated, and the left anterior mediastinal mass which runs directly in the vicinity of the left phrenic nerve is implicated as a likely cause. Degenerative  arthropathy at the sternoclavicular joints. Thoracic spondylosis. CT ABDOMEN PELVIS FINDINGS Hepatobiliary: Fluid density sharply defined 1.4 by 1.0 cm cyst posteriorly in segment 6 of the liver on image 59 series 2, this previously measured 0.6 cm in diameter back in 2014. Gallbladder unremarkable. No other liver lesion identified. Pancreas: Subtle abnormal hypoenhancement inferiorly in the pancreatic body measuring about 1.2 by 0.7 by 1.3 cm on image 38 of series 5. The pancreatic head is also mildly hypoenhancing. This could be inflammatory but infiltrative pancreatic adenocarcinoma cannot be excluded. This warrants detailed workup with either pancreatic protocol CT or MRI with and without contrast. No dorsal pancreatic duct dilatation identified. Spleen: Unremarkable Adrenals/Urinary Tract: Unremarkable Stomach/Bowel: Prominent stool throughout the colon favors constipation. Sigmoid colon diverticulosis is present and there is an inflamed diverticula and inflammatory findings the junction of the descending and sigmoid colon on image 31 series 5 suggesting mild acute diverticulitis. Tumor in this vicinity with local resulting inflammation is a differential diagnostic consideration given the findings elsewhere in today's examination. Vascular/Lymphatic: Atherosclerosis is present, including aortoiliac atherosclerotic disease. There is atheromatous calcification but only mild stenosis proximally in the superior mesenteric artery, with no findings of occlusion. No pathologic adenopathy observed in the abdomen/pelvis. Reproductive: Unremarkable Other: No supplemental non-categorized findings. Musculoskeletal: As shown on the lumbar spine MRI, there is a pathologic fracture at L1 with diffuse abnormal lysis involving the vertebral body, bilateral pedicles, and bilateral transverse processes at this level with about 65% of vertebral body height and with a posterior bulging contour of the vertebral body by about 5-6 mm.  Further detail on dedicated lumbar spine CT. IMPRESSION: 1. 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 There is also mild left supraclavicular adenopathy. 2. Scattered tree-in-bud reticulonodular opacities in the lungs, right greater than left, characteristic for atypical infectious bronchiolitis. One of the largest nodules is a 12 by 7 by 11 mm nodule in the right lateral costophrenic angle, which could be malignant or infectious. 3. Subtle abnormal hypoenhancement inferiorly in the pancreatic body measuring about 1.2 by 0.7 by 1.3 cm, along with some indistinct hypoenhancement in the pancreatic head. This could be inflammatory but infiltrative pancreatic adenocarcinoma cannot be excluded. This warrants detailed workup  with either pancreatic protocol CT or MRI with and without contrast. PET-CT could also be helpful in this case. 4. As shown on lumbar spine MRI, there is a pathologic (malignant) fracture at L1 with diffuse abnormal lysis involving the vertebral body, bilateral pedicles, and bilateral transverse processes at this level with about 65% of vertebral body height and with a posterior bulging contour of the vertebral body by about 5-6 mm. Further detail on dedicated lumbar spine CT. 5. Mild acute sigmoid colon diverticulitis. Tumor in the vicinity of the local inflammation along the sigmoid colon is not totally excluded but is a less likely differential diagnostic consideration. 6. Aortic atherosclerosis.  Coronary atherosclerosis. Aortic Atherosclerosis (ICD10-I70.0). Electronically Signed   By: Van Clines M.D.   On: 02/10/2022 17:24   MR LUMBAR SPINE WO CONTRAST  Result Date: 02/10/2022 CLINICAL DATA:  Known compression fracture at  L1. worsening pain EXAM: MRI LUMBAR SPINE WITHOUT CONTRAST TECHNIQUE: Multiplanar, multisequence MR imaging of the lumbar spine was performed. No intravenous contrast was administered. COMPARISON:  None Available. FINDINGS: Segmentation:  Standard. Alignment: Trace retrolisthesis of L3 on L4. Grade 1 anterolisthesis of L4 on L5. Vertebrae: There is a acute to subacute compression deformity at the L1 vertebral body level with posterior bulging of the cortex. This results in mild spinal canal narrowing at this level. There is diffuse T1 hypointense signal abnormality in the L1 vertebral body level which involve the bilateral pedicles, as well as the bilateral transverse processes (series 9, image 11). Cortex, particularly at the right transverse process is poorly visualized. Conus medullaris and cauda equina: Conus extends to the L1-L2 level. Conus and cauda equina appear normal. 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 There is atrophy of the paraspinal musculature. No retroperitoneal lymphadenopathy. Bilateral adrenal glands are normal in appearance. Disc levels: T11-T12: No evidence of spinal canal or neural foraminal stenosis. T12-L1: Mild spinal canal stenosis. Mild bilateral neural foraminal stenosis. L1-L2: No spinal canal stenosis. Moderate right neural foraminal stenosis. No left neural foraminal stenosis. L2-L3: Circumferential disc bulge. Mild bilateral facet degenerative change. Mild spinal canal stenosis. No neural foraminal stenosis. L3-L4: Circumferential disc bulge. Mild bilateral facet degenerative change. Moderate left neural foraminal stenosis. No right neural foraminal stenosis. Mild spinal canal narrowing. L4-L5: Grade 1 anterolisthesis. Severe bilateral facet degenerative change. Severe right neural foraminal stenosis. Mild left neural foraminal stenosis. Mild spinal canal narrowing. L5-S1: No spinal  canal or neural foraminal stenosis. IMPRESSION: Acute to subacute compression deformity at the L1 vertebral body level with posterior bulging of the cortex resulting in mild spinal canal stenosis at this level. There is diffuse T1 hypointense signal abnormality in the L1 vertebral body with involvement of the bilateral pedicles, as well as the bilateral transverse processes. This raises concerns for a pathologic fracture. Recommend further evaluation with CT of the lumbar spine and correlate with history of malignancy. Electronically Signed   By: Marin Roberts M.D.   On: 02/10/2022 15:57            LOS: 3 days       Emeterio Reeve, DO [facility] 02/14/2022, 12:18 PM   Staff may message me via secure chat in [place]  but this may not receive immediate response,  please page for urgent matters!  If 7PM-7AM, please contact night-coverage [email or web address]  Dictation software was used to generate the above note. Typos may occur and escape review, as with typed/written  notes. Please contact Dr Sheppard Coil directly for clarity if needed.

## 2022-02-14 NOTE — Plan of Care (Signed)
  Problem: Coping: Goal: Ability to adjust to condition or change in health will improve Outcome: Progressing   Problem: Education: Goal: Ability to describe self-care measures that may prevent or decrease complications (Diabetes Survival Skills Education) will improve Outcome: Progressing   Problem: Fluid Volume: Goal: Ability to maintain a balanced intake and output will improve Outcome: Progressing    Problem: Activity: Goal: Risk for activity intolerance will decrease Outcome: Progressing   Problem: Pain Managment: Goal: General experience of comfort will improve Outcome: Progressing   Problem: Safety: Goal: Ability to remain free from injury will improve Outcome: Progressing     Problem: Skin Integrity: Goal: Risk for impaired skin integrity will decrease Outcome: Progressing

## 2022-02-14 NOTE — Progress Notes (Addendum)
Physical Therapy Treatment Patient Details Name: Amber Glenn MRN: [id number] DOB: 1950/11/18 Today's Date: 02/14/2022   History of Present Illness Pt is a 71 y/o F admitted on 02/10/22 after presenting to the ED with c/o LBP that is not relieved with home medications. Pt found to have + UA. 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 Neurosurgery consulted with recommendation of TLSO as well as fixation when OR is available (unfortunately not until 11/6). PMH: HTN, HLD, NIDDM, depression, anxiety    PT Comments    Pt in chair, ready for session after being medicated.  She is able to walk x 1 lap around small nursing pod with back brace, RW and slow but generally steady gait.  Self selects speed and distance.  She does return to bed with cues to log roll but does move quickly to supine.  Reviewed process for sit->sidelying->supine.  Daughter in room.  She is RN who is helping pt with mobility in room and seems comfortable doing so.  Encouraged to get back up later in the day.  She does have some difficulty managing her brace on her own and will need continued cues/education.   Recommendations for follow up therapy are one component of a multi-disciplinary discharge planning process, led by the attending physician.  Recommendations may be updated based on patient status, additional functional criteria and insurance authorization.  Follow Up Recommendations  Home health PT     Assistance Recommended at Discharge Intermittent Supervision/Assistance  Patient can return home with the following A little help with walking and/or transfers;A little help with bathing/dressing/bathroom;Assistance with cooking/housework;Assist for transportation;Help with stairs or ramp for entrance   Equipment  Recommendations  Rolling walker (2 wheels);BSC/3in1    Recommendations for Other Services OT consult     Precautions / Restrictions Precautions Precautions: Fall;Back Required Braces or Orthoses: Spinal Brace Spinal Brace: Thoracolumbosacral orthotic;Applied in sitting position Restrictions Weight Bearing Restrictions: No     Mobility  Bed Mobility               General bed mobility comments: up in chair upon arrival.    Transfers Overall transfer level: Needs assistance Equipment used: Rolling walker (2 wheels) Transfers: Sit to/from Stand Sit to Stand: Supervision           General transfer comment: cue for hand palcements and safety    Ambulation/Gait Ambulation/Gait assistance: Min guard Gait Distance (Feet): 120 Feet Assistive device: Rolling walker (2 wheels) Gait Pattern/deviations: Decreased step length - right, Decreased step length - left, Step-through pattern Gait velocity: decreased     General Gait Details: generally slow but steady gait.  today.  education for safety but overall does well today   Stairs             Wheelchair Mobility    Modified Rankin (Stroke Patients Only)       Balance Overall balance assessment: Needs assistance Sitting-balance support: Feet supported, Bilateral upper extremity supported Sitting balance-Leahy Scale: Good     Standing balance support: Bilateral upper extremity supported, During functional activity, Reliant on assistive device for balance Standing balance-Leahy Scale: Fair                              Cognition Arousal/Alertness: Awake/alert Behavior During Therapy: WFL for tasks assessed/performed Overall  Cognitive Status: Within Functional Limits for tasks assessed                                          Exercises      General Comments        Pertinent Vitals/Pain Pain Assessment Pain Assessment: Faces Faces Pain Scale: Hurts little more Pain  Location: low back Pain Descriptors / Indicators: Aching, Discomfort Pain Intervention(s): Limited activity within patient's tolerance, Monitored during session, Repositioned, Premedicated before session    Home Living                          Prior Function            PT Goals (current goals can now be found in the care plan section) Progress towards PT goals: Progressing toward goals    Frequency    7X/week      PT Plan Current plan remains appropriate    Co-evaluation              AM-PAC PT "6 Clicks" Mobility   Outcome Measure  Help needed turning from your back to your side while in a flat bed without using bedrails?: A Little Help needed moving from lying on your back to sitting on the side of a flat bed without using bedrails?: A Little Help needed moving to and from a bed to a chair (including a wheelchair)?: A Little Help needed standing up from a chair using your arms (e.g., wheelchair or bedside chair)?: A Little Help needed to walk in hospital room?: A Little Help needed climbing 3-5 steps with a railing? : A Lot 6 Click Score: 17    End of Session Equipment Utilized During Treatment: Back brace Activity Tolerance: Patient limited by pain;Patient limited by fatigue Patient left: in bed;with call bell/phone within reach;with family/visitor present (bed alarm left off per daughter request.  she is RN and stated she has been helping her get in/out of bed/bath as needed.) Nurse Communication: Mobility status PT Visit Diagnosis: Unsteadiness on feet (R26.81);Muscle weakness (generalized) (M62.81);Pain Pain - Right/Left:  (low) Pain - part of body:  (back)     Time: 0910-0920 PT Time Calculation (min) (ACUTE ONLY): 10 min  Charges:  $Gait Training: 8-22 mins                   Chesley Noon, PTA 02/14/22, 9:49 AM

## 2022-02-14 NOTE — Plan of Care (Signed)

## 2022-02-14 NOTE — Progress Notes (Signed)
    Attending Progress Note  History: MARYSOL WELLNITZ is here for pathologic L1 fracture.  HD4: Having continued pain with mobilization.    HD2: Having continued pain.  Had biopsy yesterday  Physical Exam: Vitals:   02/14/22 0021 02/14/22 0834  BP: (!) 135/56 110/60  Pulse: 93 94  Resp: 17 17  Temp: 97.6 F (36.4 C) (!) 97.5 F (36.4 C)  SpO2: 96% 95%    AA Ox3 CNI  Strength:5/5 throughout BLE  Data:  Other tests/results: see prior notes  Assessment/Plan:  JENNIE HANNAY is here with an unstable pathologic L1 fracture.  - mobilize - pain control - DVT prophylaxis - PTOT in brace ok - She has an unstable fracture that is at significant risk of worsening with radiation.  Due to 3 column involvement, I do not feel that vertebroplasty will be effective.  I recommended fixation, which will be performed when the OR is available.  That is unfortunately on 11/6.  She can ambulate with TLSO until then.  I discussed the planned procedure at length with the patient, including the risks, benefits, alternatives, and indications. The risks discussed include but are not limited to bleeding, infection, need for reoperation, spinal fluid leak, stroke, vision loss, anesthetic complication, coma, paralysis, and even death. I also described in detail that improvement was not guaranteed.  The patient expressed understanding of these risks, and asked that we proceed with surgery. I described the surgery in layman's terms, and gave ample opportunity for questions, which were answered to the best of my ability.    Meade Maw MD, [facility] [place] Department of Neurosurgery

## 2022-02-15 ENCOUNTER — Encounter: Payer: Self-pay | Admitting: Internal Medicine

## 2022-02-15 ENCOUNTER — Other Ambulatory Visit: Payer: Self-pay

## 2022-02-15 ENCOUNTER — Inpatient Hospital Stay: Payer: Medicare HMO | Admitting: Certified Registered Nurse Anesthetist

## 2022-02-15 ENCOUNTER — Inpatient Hospital Stay: Payer: Medicare HMO

## 2022-02-15 ENCOUNTER — Inpatient Hospital Stay
Admit: 2022-02-15 | Discharge: 2022-02-15 | Disposition: A | Discharge status: Home / Self Care | Payer: Self-pay | Attending: Neurosurgery | Admitting: Neurosurgery

## 2022-02-15 ENCOUNTER — Encounter
Admission: EM | Disposition: A | Discharge status: Home Health | Payer: Self-pay | Source: Home / Self Care | Attending: Osteopathic Medicine

## 2022-02-15 DIAGNOSIS — M8458XA Pathological fracture in neoplastic disease, other specified site, initial encounter for fracture: Secondary | ICD-10-CM | POA: Diagnosis not present

## 2022-02-15 DIAGNOSIS — M532X6 Spinal instabilities, lumbar region: Secondary | ICD-10-CM | POA: Diagnosis not present

## 2022-02-15 DIAGNOSIS — M8448XA Pathological fracture, other site, initial encounter for fracture: Secondary | ICD-10-CM | POA: Diagnosis not present

## 2022-02-15 DIAGNOSIS — Z049 Encounter for examination and observation for unspecified reason: Secondary | ICD-10-CM

## 2022-02-15 HISTORY — PX: APPLICATION OF INTRAOPERATIVE CT SCAN: SHX6668

## 2022-02-15 LAB — GLUCOSE, CAPILLARY
Glucose-Capillary: 86 mg/dL (ref 70–99)
Glucose-Capillary: 87 mg/dL (ref 70–99)
Glucose-Capillary: 104 mg/dL — ABNORMAL HIGH (ref 70–99)
Glucose-Capillary: 198 mg/dL — ABNORMAL HIGH (ref 70–99)

## 2022-02-15 LAB — CBC
HCT: 30.7 % — ABNORMAL LOW (ref 36.0–46.0)
HCT: 34.3 % — ABNORMAL LOW (ref 36.0–46.0)
Hemoglobin: 9.5 g/dL — ABNORMAL LOW (ref 12.0–15.0)
Hemoglobin: 10.3 g/dL — ABNORMAL LOW (ref 12.0–15.0)
MCH: 24.3 pg — ABNORMAL LOW (ref 26.0–34.0)
MCH: 23.6 pg — ABNORMAL LOW (ref 26.0–34.0)
MCHC: 30.9 g/dL (ref 30.0–36.0)
MCHC: 30 g/dL (ref 30.0–36.0)
MCV: 78.5 fL — ABNORMAL LOW (ref 80.0–100.0)
MCV: 78.7 fL — ABNORMAL LOW (ref 80.0–100.0)
Platelets: 273 10*3/uL (ref 150–400)
Platelets: 276 10*3/uL (ref 150–400)
RBC: 3.91 MIL/uL (ref 3.87–5.11)
RBC: 4.36 MIL/uL (ref 3.87–5.11)
RDW: 16.8 % — ABNORMAL HIGH (ref 11.5–15.5)
RDW: 16.4 % — ABNORMAL HIGH (ref 11.5–15.5)
WBC: 6.3 10*3/uL (ref 4.0–10.5)
WBC: 10.7 10*3/uL — ABNORMAL HIGH (ref 4.0–10.5)
nRBC: 0 % (ref 0.0–0.2)
nRBC: 0 % (ref 0.0–0.2)

## 2022-02-15 LAB — BASIC METABOLIC PANEL
Anion gap: 6 (ref 5–15)
Anion gap: 6 (ref 5–15)
BUN: 23 mg/dL (ref 8–23)
BUN: 18 mg/dL (ref 8–23)
CO2: 25 mmol/L (ref 22–32)
CO2: 24 mmol/L (ref 22–32)
Calcium: 9.6 mg/dL (ref 8.9–10.3)
Calcium: 9.1 mg/dL (ref 8.9–10.3)
Chloride: 103 mmol/L (ref 98–111)
Chloride: 108 mmol/L (ref 98–111)
Creatinine, Ser: 0.96 mg/dL (ref 0.44–1.00)
Creatinine, Ser: 0.7 mg/dL (ref 0.44–1.00)
GFR, Estimated: >60 mL/min (ref >60)
GFR, Estimated: >60 mL/min (ref >60)
Glucose, Bld: 91 mg/dL (ref 70–99)
Glucose, Bld: 115 mg/dL — ABNORMAL HIGH (ref 70–99)
Potassium: 4.2 mmol/L (ref 3.5–5.1)
Potassium: 4.2 mmol/L (ref 3.5–5.1)
Sodium: 134 mmol/L — ABNORMAL LOW (ref 135–145)
Sodium: 138 mmol/L (ref 135–145)

## 2022-02-15 SURGERY — POSTERIOR THORACIC FUSION 4 LEVELS
Anesthesia: General

## 2022-02-15 MED ORDER — METOPROLOL TARTRATE 5 MG/5ML IV SOLN
INTRAVENOUS | Status: AC
  Filled 2022-02-15: qty 5

## 2022-02-15 MED ORDER — PHENYLEPHRINE HCL-NACL 20-0.9 MG/250ML-% IV SOLN
INTRAVENOUS | Status: DC | PRN
  Administered 2022-02-15: 32 ug/min via INTRAVENOUS

## 2022-02-15 MED ORDER — FENTANYL CITRATE (PF) 100 MCG/2ML IJ SOLN
INTRAMUSCULAR | Status: DC | PRN
  Administered 2022-02-15 (×2): 50 ug via INTRAVENOUS
  Administered 2022-02-15: 25 ug via INTRAVENOUS

## 2022-02-15 MED ORDER — ONDANSETRON HCL 4 MG/2ML IJ SOLN
INTRAMUSCULAR | Status: AC
  Filled 2022-02-15: qty 2

## 2022-02-15 MED ORDER — CEFAZOLIN SODIUM 1 G IJ SOLR
INTRAMUSCULAR | Status: AC
  Filled 2022-02-15: qty 20

## 2022-02-15 MED ORDER — ONDANSETRON HCL 4 MG/2ML IJ SOLN: 4.0000 mg | Freq: Once | INTRAMUSCULAR | Status: DC | PRN

## 2022-02-15 MED ORDER — BUPIVACAINE HCL (PF) 0.5 % IJ SOLN
INTRAMUSCULAR | Status: AC
  Filled 2022-02-15: qty 10

## 2022-02-15 MED ORDER — ESMOLOL HCL 100 MG/10ML IV SOLN
INTRAVENOUS | Status: AC
  Filled 2022-02-15: qty 20

## 2022-02-15 MED ORDER — GLYCOPYRROLATE 0.2 MG/ML IJ SOLN
INTRAMUSCULAR | Status: DC | PRN
  Administered 2022-02-15: .2 mg via INTRAVENOUS

## 2022-02-15 MED ORDER — SURGIRINSE WOUND IRRIGATION SYSTEM - OPTIME
TOPICAL | Status: DC | PRN
  Administered 2022-02-15: 450 mL via TOPICAL

## 2022-02-15 MED ORDER — METHOCARBAMOL 1000 MG/10ML IJ SOLN
500.0000 mg | Freq: Once | INTRAVENOUS | Status: AC
  Administered 2022-02-15: 500 mg via INTRAVENOUS
  Filled 2022-02-15: qty 5

## 2022-02-15 MED ORDER — LACTATED RINGERS IV SOLN: INTRAVENOUS | Status: DC | PRN

## 2022-02-15 MED ORDER — METOPROLOL TARTRATE 5 MG/5ML IV SOLN
INTRAVENOUS | Status: DC | PRN
  Administered 2022-02-15 (×2): 1 mg via INTRAVENOUS

## 2022-02-15 MED ORDER — BUPIVACAINE-EPINEPHRINE (PF) 0.5% -1:200000 IJ SOLN
INTRAMUSCULAR | Status: DC | PRN
  Administered 2022-02-15: 8 mL

## 2022-02-15 MED ORDER — CEFAZOLIN SODIUM-DEXTROSE 2-3 GM-%(50ML) IV SOLR
INTRAVENOUS | Status: DC | PRN
  Administered 2022-02-15: 2 g via INTRAVENOUS

## 2022-02-15 MED ORDER — FENTANYL CITRATE (PF) 100 MCG/2ML IJ SOLN
INTRAMUSCULAR | Status: AC
  Filled 2022-02-15: qty 2

## 2022-02-15 MED ORDER — SUGAMMADEX SODIUM 200 MG/2ML IV SOLN
INTRAVENOUS | Status: DC | PRN
  Administered 2022-02-15: 300 mg via INTRAVENOUS

## 2022-02-15 MED ORDER — PHENYLEPHRINE 80 MCG/ML (10ML) SYRINGE FOR IV PUSH (FOR BLOOD PRESSURE SUPPORT)
PREFILLED_SYRINGE | INTRAVENOUS | Status: AC
  Filled 2022-02-15: qty 10

## 2022-02-15 MED ORDER — PHENYLEPHRINE HCL (PRESSORS) 10 MG/ML IV SOLN
INTRAVENOUS | Status: DC | PRN
  Administered 2022-02-15 (×2): 120 ug via INTRAVENOUS
  Administered 2022-02-15: 160 ug via INTRAVENOUS

## 2022-02-15 MED ORDER — SURGIFLO WITH THROMBIN (HEMOSTATIC MATRIX KIT) OPTIME
TOPICAL | Status: DC | PRN
  Administered 2022-02-15: 1 via TOPICAL

## 2022-02-15 MED ORDER — HALOPERIDOL LACTATE 5 MG/ML IJ SOLN
1.0000 mg | Freq: Once | INTRAMUSCULAR | Status: AC
  Administered 2022-02-15: 1 mg via INTRAVENOUS

## 2022-02-15 MED ORDER — 0.9 % SODIUM CHLORIDE (POUR BTL) OPTIME
TOPICAL | Status: DC | PRN
  Administered 2022-02-15: 500 mL

## 2022-02-15 MED ORDER — DEXAMETHASONE SODIUM PHOSPHATE 10 MG/ML IJ SOLN
INTRAMUSCULAR | Status: DC | PRN
  Administered 2022-02-15: 8 mg via INTRAVENOUS

## 2022-02-15 MED ORDER — LIDOCAINE HCL (CARDIAC) PF 100 MG/5ML IV SOSY
PREFILLED_SYRINGE | INTRAVENOUS | Status: DC | PRN
  Administered 2022-02-15: 60 mg via INTRAVENOUS

## 2022-02-15 MED ORDER — GLYCOPYRROLATE 0.2 MG/ML IJ SOLN
INTRAMUSCULAR | Status: AC
  Filled 2022-02-15: qty 3

## 2022-02-15 MED ORDER — LABETALOL HCL 5 MG/ML IV SOLN
INTRAVENOUS | Status: AC
  Filled 2022-02-15: qty 4

## 2022-02-15 MED ORDER — CEFAZOLIN SODIUM-DEXTROSE 2-4 GM/100ML-% IV SOLN
INTRAVENOUS | Status: AC
  Filled 2022-02-15: qty 100

## 2022-02-15 MED ORDER — PROPOFOL 10 MG/ML IV BOLUS
INTRAVENOUS | Status: AC
  Filled 2022-02-15: qty 20

## 2022-02-15 MED ORDER — DEXMEDETOMIDINE HCL IN NACL 200 MCG/50ML IV SOLN
INTRAVENOUS | Status: DC | PRN
  Administered 2022-02-15 (×2): 8 ug via INTRAVENOUS

## 2022-02-15 MED ORDER — MIDAZOLAM HCL 2 MG/2ML IJ SOLN
INTRAMUSCULAR | Status: AC
  Administered 2022-02-15: 1 mg via INTRAVENOUS
  Filled 2022-02-15: qty 2

## 2022-02-15 MED ORDER — HYDROMORPHONE HCL 1 MG/ML IJ SOLN: 0.5000 mg | Freq: Once | INTRAMUSCULAR | Status: DC

## 2022-02-15 MED ORDER — HALOPERIDOL LACTATE 5 MG/ML IJ SOLN
INTRAMUSCULAR | Status: AC
  Administered 2022-02-15: 1 mg via INTRAVENOUS
  Filled 2022-02-15: qty 1

## 2022-02-15 MED ORDER — MIDAZOLAM HCL 2 MG/2ML IJ SOLN: 2.0000 mg | Freq: Once | INTRAMUSCULAR | Status: AC

## 2022-02-15 MED ORDER — ROCURONIUM BROMIDE 100 MG/10ML IV SOLN
INTRAVENOUS | Status: DC | PRN
  Administered 2022-02-15: 50 mg via INTRAVENOUS

## 2022-02-15 MED ORDER — HYDROCODONE-ACETAMINOPHEN 10-325 MG PO TABS
1.0000 | ORAL_TABLET | Freq: Four times a day (QID) | ORAL | Status: DC
  Administered 2022-02-15 – 2022-02-16 (×6): 1 via ORAL
  Filled 2022-02-15 (×6): qty 1

## 2022-02-15 MED ORDER — DIPHENHYDRAMINE HCL 50 MG/ML IJ SOLN
INTRAMUSCULAR | Status: AC
  Filled 2022-02-15: qty 1

## 2022-02-15 MED ORDER — ONDANSETRON HCL 4 MG/2ML IJ SOLN
INTRAMUSCULAR | Status: DC | PRN
  Administered 2022-02-15: 4 mg via INTRAVENOUS

## 2022-02-15 MED ORDER — FENTANYL CITRATE (PF) 100 MCG/2ML IJ SOLN: 25.0000 ug | INTRAMUSCULAR | Status: DC | PRN

## 2022-02-15 MED ORDER — SODIUM CHLORIDE (PF) 0.9 % IJ SOLN
INTRAMUSCULAR | Status: DC | PRN
  Administered 2022-02-15: 60 mL via INTRAMUSCULAR

## 2022-02-15 MED ORDER — PROPOFOL 10 MG/ML IV BOLUS
INTRAVENOUS | Status: DC | PRN
  Administered 2022-02-15: 100 mg via INTRAVENOUS
  Administered 2022-02-15: 20 mg via INTRAVENOUS

## 2022-02-15 MED ORDER — FENTANYL CITRATE (PF) 100 MCG/2ML IJ SOLN
INTRAMUSCULAR | Status: AC
  Administered 2022-02-15: 25 ug via INTRAVENOUS
  Filled 2022-02-15: qty 2

## 2022-02-15 MED ORDER — SUGAMMADEX SODIUM 500 MG/5ML IV SOLN
INTRAVENOUS | Status: AC
  Filled 2022-02-15: qty 5

## 2022-02-15 MED ORDER — SODIUM CHLORIDE 0.9 % IV SOLN: INTRAVENOUS | Status: DC

## 2022-02-15 MED ORDER — BUPIVACAINE-EPINEPHRINE (PF) 0.5% -1:200000 IJ SOLN
INTRAMUSCULAR | Status: AC
  Filled 2022-02-15: qty 30

## 2022-02-15 MED ORDER — EPHEDRINE 5 MG/ML INJ
INTRAVENOUS | Status: AC
  Filled 2022-02-15: qty 5

## 2022-02-15 SURGICAL SUPPLY — 69 items
ADH SKN CLS APL DERMABOND .7 (GAUZE/BANDAGES/DRESSINGS) ×4
AGENT HMST KT MTR STRL THRMB (HEMOSTASIS) ×2
ALLOGRAFT BONE FIBER KORE 10CC (Bone Implant) IMPLANT
APL PRP STRL LF DISP 70% ISPRP (MISCELLANEOUS)
BASIN KIT SINGLE STR (MISCELLANEOUS) ×2 IMPLANT
BUR NEURO DRILL SOFT 3.0X3.8M (BURR) ×2 IMPLANT
CAP LOCKING SPINE (Cap) IMPLANT
CHLORAPREP W/TINT 26 (MISCELLANEOUS) ×6 IMPLANT
CNTNR SPEC 2.5X3XGRAD LEK (MISCELLANEOUS) ×2
CONT SPEC 4OZ STER OR WHT (MISCELLANEOUS) ×2
CONT SPEC 4OZ STRL OR WHT (MISCELLANEOUS) ×2
CONTAINER SPEC 2.5X3XGRAD LEK (MISCELLANEOUS) ×2 IMPLANT
COUNTER NEEDLE 20/40 LG (NEEDLE) ×4 IMPLANT
CUP MEDICINE 2OZ PLAST GRAD ST (MISCELLANEOUS) ×2 IMPLANT
DERMABOND ADVANCED .7 DNX12 (GAUZE/BANDAGES/DRESSINGS) ×2 IMPLANT
DRAPE C ARM PK CFD 31 SPINE (DRAPES) ×2 IMPLANT
DRAPE LAPAROTOMY 100X77 ABD (DRAPES) ×2 IMPLANT
DRAPE MICROSCOPE SPINE 48X150 (DRAPES) ×2 IMPLANT
DRAPE SURG 17X11 SM STRL (DRAPES) ×2 IMPLANT
DRSG OPSITE POSTOP 3X4 (GAUZE/BANDAGES/DRESSINGS) IMPLANT
DRSG OPSITE POSTOP 4X8 (GAUZE/BANDAGES/DRESSINGS) IMPLANT
ELECT CAUTERY BLADE TIP 2.5 (TIP) ×4
ELECT EZSTD 165MM 6.5IN (MISCELLANEOUS)
ELECT REM PT RETURN 9FT ADLT (ELECTROSURGICAL) ×2
ELECTRODE CAUTERY BLDE TIP 2.5 (TIP) ×4 IMPLANT
ELECTRODE EZSTD 165MM 6.5IN (MISCELLANEOUS) IMPLANT
ELECTRODE REM PT RTRN 9FT ADLT (ELECTROSURGICAL) ×2 IMPLANT
EX-PIN ORTHOLOCK NAV 4X150 (PIN) IMPLANT
GAUZE 4X4 16PLY ~~LOC~~+RFID DBL (SPONGE) ×2 IMPLANT
GLOVE BIOGEL PI IND STRL 6.5 (GLOVE) ×2 IMPLANT
GLOVE BIOGEL PI IND STRL 8.5 (GLOVE) ×2 IMPLANT
GLOVE SURG SYN 6.5 ES PF (GLOVE) ×4 IMPLANT
GLOVE SURG SYN 6.5 PF PI (GLOVE) ×4 IMPLANT
GLOVE SURG SYN 8.5  E (GLOVE) ×6
GLOVE SURG SYN 8.5 E (GLOVE) ×6 IMPLANT
GLOVE SURG SYN 8.5 PF PI (GLOVE) ×6 IMPLANT
GOWN SRG LRG LVL 4 IMPRV REINF (GOWNS) ×2 IMPLANT
GOWN SRG XL LVL 3 NONREINFORCE (GOWNS) ×2 IMPLANT
GOWN STRL NON-REIN TWL XL LVL3 (GOWNS) ×2
GOWN STRL REIN LRG LVL4 (GOWNS) ×2
GRADUATE 1200CC STRL 31836 (MISCELLANEOUS) ×4 IMPLANT
HOLDER FOLEY CATH W/STRAP (MISCELLANEOUS) ×2 IMPLANT
K-WIRE 1.6 NITINOL SHARP TIP (WIRE) ×16
KIT PREVENA INCISION MGT 13 (CANNISTER) ×2 IMPLANT
KIT SPINAL PRONEVIEW (KITS) ×2 IMPLANT
KWIRE 1.6 NITINOL SHARP TIP (WIRE) IMPLANT
MANIFOLD NEPTUNE II (INSTRUMENTS) ×2 IMPLANT
MARKER SKIN DUAL TIP RULER LAB (MISCELLANEOUS) ×6 IMPLANT
MARKER SPHERE PSV REFLC 13MM (MARKER) IMPLANT
NDL SAFETY ECLIP 18X1.5 (MISCELLANEOUS) ×2 IMPLANT
NS IRRIG 1000ML POUR BTL (IV SOLUTION) ×2 IMPLANT
NS IRRIG 500ML POUR BTL (IV SOLUTION) IMPLANT
PACK LAMINECTOMY NEURO (CUSTOM PROCEDURE TRAY) ×2 IMPLANT
PAD ARMBOARD 7.5X6 YLW CONV (MISCELLANEOUS) ×2 IMPLANT
ROD SPINE CREO 5.5X130 (Rod) IMPLANT
SCREW CREO 6.5X45 FEN (Screw) IMPLANT
SCREW CREO MIS 30 TULIP (Screw) IMPLANT
SOLUTION IRRIG SURGIPHOR (IV SOLUTION) ×2 IMPLANT
SURGIFLO W/THROMBIN 8M KIT (HEMOSTASIS) ×2 IMPLANT
SUT DVC VLOC 3-0 CL 6 P-12 (SUTURE) ×2 IMPLANT
SUT VIC AB 0 CT1 27 (SUTURE) ×12
SUT VIC AB 0 CT1 27XCR 8 STRN (SUTURE) ×4 IMPLANT
SUT VIC AB 2-0 CT1 18 (SUTURE) ×4 IMPLANT
SYR 10ML LL (SYRINGE) ×4 IMPLANT
SYR 30ML LL (SYRINGE) ×4 IMPLANT
TOWEL OR 17X26 4PK STRL BLUE (TOWEL DISPOSABLE) ×8 IMPLANT
TRAP FLUID SMOKE EVACUATOR (MISCELLANEOUS) ×2 IMPLANT
TUBING CONNECTING 10 (TUBING) ×4 IMPLANT
WATER STERILE IRR 1000ML POUR (IV SOLUTION) ×4 IMPLANT

## 2022-02-15 NOTE — Interval H&P Note (Signed)
History and Physical Interval Note:  02/15/2022 12:47 PM  [name]  has presented today for surgery, with the diagnosis of Pathologic L1 fracture, lumbar spine instability.  The various methods of treatment have been discussed with the patient and family. After consideration of risks, benefits and other options for treatment, the patient has consented to  Procedure(s): POSTERIOR THORACIC FUSION 4 LEVELS (N/A) as a surgical intervention.  The patient's history has been reviewed, patient examined, no change in status, stable for surgery.  I have reviewed the patient's chart and labs.  Questions were answered to the patient's satisfaction.    Heart sounds normal no MRG. Chest Clear to Auscultation Bilaterally.    Amber Glenn

## 2022-02-15 NOTE — Anesthesia Procedure Notes (Signed)
Procedure Name: Intubation Date/Time: 02/15/2022 1:35 PM  Performed by: Gayland Curry, CRNAPre-anesthesia Checklist: Patient identified, Emergency Drugs available, Suction available and Patient being monitored Patient Re-evaluated:Patient Re-evaluated prior to induction Oxygen Delivery Method: Circle system utilized Preoxygenation: Pre-oxygenation with 100% oxygen Induction Type: IV induction Ventilation: Mask ventilation without difficulty and Oral airway inserted - appropriate to patient size Laryngoscope Size: McGraph and 4 Grade View: Grade II Tube type: Oral Tube size: 6.5 mm Number of attempts: 1 Airway Equipment and Method: Stylet and Oral airway Placement Confirmation: ETT inserted through vocal cords under direct vision, positive ETCO2 and breath sounds checked- equal and bilateral Secured at: 20 cm Tube secured with: Tape Dental Injury: Teeth and Oropharynx as per pre-operative assessment  Difficulty Due To: Difficulty was unanticipated Comments: Patient's airway was not typical and was very friable tissue.  Anatomy seemed unnatural with significant leftward deviation of the trachea.  Never encountered the vallecula or epiglottis due to deviation.

## 2022-02-15 NOTE — Anesthesia Preprocedure Evaluation (Addendum)
Anesthesia Evaluation  Patient identified by MRN, date of birth, ID band Patient awake    Reviewed: Allergy & Precautions, NPO status , Patient's Chart, lab work & pertinent test results  History of Anesthesia Complications Negative for: history of anesthetic complications  Airway Mallampati: II  TM Distance: >3 FB Neck ROM: Full    Dental  (+) Dental Advidsory Given, Caps, Teeth Intact   Pulmonary shortness of breath and with exertion, neg sleep apnea, neg COPD, neg recent URI, former smoker Lung masses noted on chest CT   breath sounds clear to auscultation- rhonchi (-) wheezing      Cardiovascular hypertension, Pt. on medications (-) angina (-) CAD, (-) Past MI, (-) Cardiac Stents and (-) CABG (-) dysrhythmias (-) Valvular Problems/Murmurs Rhythm:Regular Rate:Normal - Systolic murmurs and - Diastolic murmurs    Neuro/Psych neg Seizures negative neurological ROS  negative psych ROS   GI/Hepatic Neg liver ROS,GERD  ,,  Endo/Other  diabetes, Oral Hypoglycemic Agents    Renal/GU negative Renal ROS     Musculoskeletal  (+) Arthritis ,    Abdominal  (+) - obese  Peds  Hematology negative hematology ROS (+)   Anesthesia Other Findings Past Medical History: No date: Arthritis     Comment:  hands and feet No date: Cataract     Comment:  right No date: Complication of anesthesia     Comment:  sometimes have a hard time waking up No date: Diabetes mellitus     Comment:  Borderline Diabetes No date: GERD (gastroesophageal reflux disease) No date: HTN (hypertension) No date: Hyperlipidemia No date: Motion sickness 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   Reproductive/Obstetrics                             Anesthesia Physical Anesthesia Plan  ASA: 3  Anesthesia Plan: General   Post-op Pain Management:    Induction: Intravenous  PONV Risk Score and Plan: 2 and  Ondansetron, Dexamethasone, Midazolam and Treatment may vary due to age or medical condition  Airway Management Planned: Oral ETT  Additional Equipment:   Intra-op Plan:   Post-operative Plan: Extubation in OR  Informed Consent: I have reviewed the patients History and Physical, chart, labs and discussed the procedure including the risks, benefits and alternatives for the proposed anesthesia with the patient or authorized representative who has indicated his/her understanding and acceptance.     Dental advisory given  Plan Discussed with: CRNA and Anesthesiologist  Anesthesia Plan Comments:         Anesthesia Quick Evaluation

## 2022-02-15 NOTE — Plan of Care (Signed)
  Problem: Coping: Goal: Ability to adjust to condition or change in health will improve Outcome: Progressing   Problem: Metabolic: Goal: Ability to maintain appropriate glucose levels will improve Outcome: Progressing   Problem: Skin Integrity: Goal: Risk for impaired skin integrity will decrease Outcome: Progressing   Problem: Clinical Measurements: Goal: Ability to maintain clinical measurements within normal limits will improve Outcome: Progressing   Problem: Pain Managment: Goal: General experience of comfort will improve Outcome: Progressing   Problem: Safety: Goal: Ability to remain free from injury will improve Outcome: Progressing

## 2022-02-15 NOTE — Progress Notes (Signed)
PROGRESS NOTE    Amber Glenn   RSW:[id number] DOB: 1950-12-12  DOA: 02/10/2022 Date of Service: 02/15/22 PCP: Lynnell Jude, MD     Brief Narrative / Hospital Course:  [name] is a 71 year old female with history of Glenn, Amber Glenn, Amber Glenn, Amber Glenn, Amber Glenn, Amber presents emergency department for chief concerns of low back pain not relieved with home pain medications. 11/01: in ED, afebrile, tachycardic 110 to 130 bpm, hypotensive 157/91, SPO2 100% on RA.  Unremarkable BMP, mild anemia Hgb 11.5 and MCV 79.  UA positive leukocytes, nitrate, with 11-20 WBC/hpf. Imaging: MRI lumbar spine: Acute/subacute compression deformity of L1, mild spinal canal stenosis due to posterior bulging cortex.  Diffuse T1 hypointense abnormality in the L1 vertebral body/pedicle/TP, raising concerns for pathologic fracture.  CT lumbar spine demonstrated tumor involvement of bilateral pedicles, right greater than left TP, right lamina with bony expansion at this level contributing to borderline impingement at T12/L1.  CT chest/abdomen/pelvis demonstrated 3.1 cm centrally necrotic mediastinal mass left prevascular region, scattered reticulonodular opacities in the lungs concerning for atypical infectious bronchiolitis, larger nodule in the right lateral costophrenic angle malignant/infectious, inflammatory pancreatic changes versus infiltrative pancreatic adenocarcinoma, pathologic lumbar spine fracture as noted above, mild acute sigmoid colon diverticulitis and tumor not excluded in this area. ED treatment: Keflex 500 mg p.o. one-time dose, fentanyl 50 mcg one-time dose, morphine one-time dose, Dilaudid 0.5 mg IV one-time dose, ondansetron. 11/02: US guided lymph node biopsy. Pain control has been difficult.  11/03: Neurosurgery planning for intervention on Monday (today is Friday). Tapering down on dilaudid.  11/04-11/05: stable. Lymph node pathology still  pending as expected. Pain controlled today.     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 Janese Banks following Pain control: Hydrocodone-APAP 5-325 mg two tabs q6h, ibuprofen 600 mg q6h, dilaudid IV 1 mg q4h prn severe pain, dilaudid 0.5 mg q4h prn moderate pain. Advised pt and family will plan weaning down as able to avoid dependence/complications. Bowel regimen ordered.  Per Onc: IR 11/02 biopsy mediastinal mass --> path pending as of AM 02/15/22  planning fusion w/ neurosurgery today    Glenn Losartan 100 mg daily resumed, carvedilol 3.125 mg p.o. twice daily resumed Hydralazine 5 mg IV every 8 hours.  For SBP greater than 180, 4 days ordered   Amber Glenn Simvastatin 40 mg daily resumed   Diabetes Glenn type 2, controlled ([facility]) Insulin SSI with at bedtime coverage ordered Pt has requested regular diet, I have obliged, will adjust insulin as needed   E Coli UTI D/c IV abx, started cefdinir to finish course    DVT prophylaxis: heparin Pertinent IV fluids/nutrition: tolerating po, no continuous IV fluids  Central lines / invasive  devices:  none   Code Status: FULL CODE Family Communication: grandson at bedside on rounds  Disposition: inpatient  TOC needs: none at this time Barriers to discharge / significant pending items: pending biopsy resutls, fusion procedure today, and requiring IV pain control anticipate will need rehab/SNF, anticipate discharge next few days              Subjective:  Patient reports back pain is okay as long as she's lying down resting but is bothering her a lot this morning. Complains of dry mouth this morning as well. No CP/SOB. No other complaints today.        Objective:  Vitals:   02/14/22 1134 02/14/22 1458 02/14/22 2356 02/15/22 0715  BP: 134/69 (!) 120/59 (!) 118/59 (!) 131/51  Pulse: (!) 103 (!) 103 91 97  Resp: 18 16 16    Temp:  97.9 F (36.6 C) 97.8 F (36.6 C) 98.2 F (36.8 C)  TempSrc:   Oral   SpO2:  93% 94% 91%  Weight:      Height:       No intake or output data in the 24 hours ending 02/15/22 0835  Filed Weights   02/10/22 1120 02/10/22 2031  Weight: 59.4 kg 59.4 kg    Examination:  Constitutional:  VS as above General Appearance: alert, well-developed, well-nourished, NAD Respiratory: Normal respiratory effort Cardiovascular: S1/S2 normal No murmur Gastrointestinal: No tenderness Musculoskeletal:  No clubbing/cyanosis of digits Symmetrical movement in all extremities Neurological: No cranial nerve deficit on limited exam Alert Psychiatric: Normal judgment/insight Normal mood and affect       Scheduled Medications:   vitamin C  1,000 mg Oral Daily   carvedilol  3.125 mg Oral BID WC   cefdinir  300 mg Oral Q12H   cyclobenzaprine  10 mg Oral TID   feeding supplement (GLUCERNA SHAKE)  237 mL Oral TID BM   gabapentin  100 mg Oral TID   heparin  5,000 Units Subcutaneous Q8H   HYDROcodone-acetaminophen  2 tablet Oral Q6H   ibuprofen  600 mg Oral Q6H   influenza vaccine adjuvanted  0.5 mL Intramuscular Tomorrow-1000    insulin aspart  0-15 Units Subcutaneous TID WC   insulin aspart  0-5 Units Subcutaneous QHS   metFORMIN  500 mg Oral Q breakfast   multivitamin with minerals  1 tablet Oral Daily   pantoprazole  40 mg Oral Daily   polyethylene glycol  17 g Oral Daily   senna-docusate  2 tablet Oral BID   simvastatin  40 mg Oral q1800    Continuous Infusions:    PRN Medications:  alum & mag hydroxide-simeth, fentaNYL, HYDROmorphone (DILAUDID) injection, lactulose, ondansetron **OR** ondansetron (ZOFRAN) IV  Antimicrobials:  Anti-infectives (From admission, onward)    Start     Dose/Rate Route Frequency Ordered Stop   02/13/22 1000  cefdinir (OMNICEF) capsule 300 mg        300 mg Oral Every 12 hours 02/13/22 0829 02/17/22 0959   02/11/22 1600  cefTRIAXone (ROCEPHIN) 1 g in sodium chloride 0.9 % 100 mL IVPB  Status:  Discontinued        1 g 200 mL/hr over 30 Minutes Intravenous Every 24 hours 02/11/22 1514 02/13/22 0829   02/10/22 1800  azithromycin (ZITHROMAX) tablet 500 mg        500 mg Oral  Once 02/10/22 1758 02/10/22 1826   02/10/22 1530  cephALEXin (KEFLEX) capsule 500 mg        500 mg Oral  Once 02/10/22 1523  02/10/22 1551       Data Reviewed: I have personally reviewed following labs and imaging studies  CBC: Recent Labs  Lab 02/10/22 1148 02/11/22 0322 02/12/22 0343 02/13/22 0516 02/15/22 0428  WBC 9.7 6.9 8.1  --  6.3  NEUTROABS 8.3*  --   --   --   --   HGB 11.5* 10.5* 9.9* 9.5* 9.5*  HCT 38.4 35.5* 32.6* 31.2* 30.7*  MCV 79.2* 80.1 79.1*  --  78.5*  PLT 265 273 281  --  329   Basic Metabolic Panel: Recent Labs  Lab 02/10/22 1148 02/11/22 0322 02/12/22 0343 02/15/22 0428  NA 135 137 135 134*  K 4.4 4.6 4.4 4.2  CL 101 102 100 103  CO2 28 27 28 25   GLUCOSE 140* 119* 103* 91  BUN 17 13 19 23   CREATININE 0.74 0.60 0.88 0.96  CALCIUM 9.7 9.6 9.6 9.6   GFR: Estimated Creatinine Clearance: 42.1 mL/min (by C-G formula based on SCr of 0.96 mg/dL). Liver Function  Tests: No results for input(s): "AST", "ALT", "ALKPHOS", "BILITOT", "PROT", "ALBUMIN" in the last 168 hours. No results for input(s): "LIPASE", "AMYLASE" in the last 168 hours. No results for input(s): "AMMONIA" in the last 168 hours. Coagulation Profile: No results for input(s): "INR", "PROTIME" in the last 168 hours. Cardiac Enzymes: No results for input(s): "CKTOTAL", "CKMB", "CKMBINDEX", "TROPONINI" in the last 168 hours. BNP (last 3 results) No results for input(s): "PROBNP" in the last 8760 hours. HbA1C: No results for input(s): "HGBA1C" in the last 72 hours.  CBG: Recent Labs  Lab 02/14/22 0837 02/14/22 1130 02/14/22 1659 02/14/22 2100 02/15/22 0751  GLUCAP 90 116* 111* 110* 86   Lipid Profile: No results for input(s): "CHOL", "HDL", "LDLCALC", "TRIG", "CHOLHDL", "LDLDIRECT" in the last 72 hours. Thyroid Function Tests: No results for input(s): "TSH", "T4TOTAL", "FREET4", "T3FREE", "THYROIDAB" in the last 72 hours. Anemia Panel: No results for input(s): "VITAMINB12", "FOLATE", "FERRITIN", "TIBC", "IRON", "RETICCTPCT" in the last 72 hours. Urine analysis:    Component Value Date/Time   COLORURINE YELLOW (A) 02/10/2022 1148   APPEARANCEUR HAZY (A) 02/10/2022 1148   LABSPEC 1.026 02/10/2022 1148   PHURINE 5.0 02/10/2022 1148   GLUCOSEU NEGATIVE 02/10/2022 1148   HGBUR NEGATIVE 02/10/2022 1148   BILIRUBINUR NEGATIVE 02/10/2022 1148   KETONESUR 20 (A) 02/10/2022 1148   PROTEINUR NEGATIVE 02/10/2022 1148   NITRITE POSITIVE (A) 02/10/2022 1148   LEUKOCYTESUR SMALL (A) 02/10/2022 1148   Sepsis Labs: @LABRCNTIP (procalcitonin:4,lacticidven:4)  Recent Results (from the past 240 hour(s))  Urine Culture     Status: Abnormal   Collection Time: 02/10/22 11:48 AM   Specimen: Urine, Clean Catch  Result Value Ref Range Status   Specimen Description   Final    URINE, CLEAN CATCH Performed at [facility], [address], [place], [place] [address]    Special  Requests   Final    NONE Performed at [facility], [address] [place], [place] [address]    Culture >=100,000 COLONIES/mL ESCHERICHIA COLI (A)  Final   Report Status 02/12/2022 FINAL  Final   Organism ID, Bacteria ESCHERICHIA COLI (A)  Final      Susceptibility   Escherichia coli - MIC*    AMPICILLIN >=32 RESISTANT Resistant     CEFAZOLIN <=4 SENSITIVE Sensitive     CEFEPIME <=0.12 SENSITIVE Sensitive     CEFTRIAXONE <=0.25 SENSITIVE Sensitive     CIPROFLOXACIN <=0.25 SENSITIVE Sensitive     GENTAMICIN <=1 SENSITIVE Sensitive  IMIPENEM <=0.25 SENSITIVE Sensitive     NITROFURANTOIN <=16 SENSITIVE Sensitive     TRIMETH/SULFA <=20 SENSITIVE Sensitive     AMPICILLIN/SULBACTAM 16 INTERMEDIATE Intermediate     PIP/TAZO <=4 SENSITIVE Sensitive     * >=100,000 COLONIES/mL ESCHERICHIA COLI  Surgical pcr screen     Status: None   Collection Time: 02/14/22 11:56 AM   Specimen: Nasal Mucosa; Nasal Swab  Result Value Ref Range Status   MRSA, PCR NEGATIVE NEGATIVE Final   Staphylococcus aureus NEGATIVE NEGATIVE Final    Comment: (NOTE) The Xpert SA Assay (FDA approved for NASAL specimens in patients 40 years of age and older), is one component of a comprehensive surveillance program. It is not intended to diagnose infection nor to guide or monitor treatment. Performed at [facility], [address] [place], [place] [address]          Radiology Studies: CT T-SPINE NO CHARGE  Result Date: 02/10/2022 CLINICAL DATA:  Pathologic fracture at L1 EXAM: CT THORACIC AND LUMBAR SPINE WITHOUT CONTRAST TECHNIQUE: Multidetector CT imaging of the thoracic and lumbar spine was performed without contrast. Multiplanar CT image reconstructions were also generated. RADIATION DOSE REDUCTION: This exam was performed according to the departmental dose-optimization program which includes automated exposure control, adjustment of the mA and/or kV according to patient size and/or  use of iterative reconstruction technique. COMPARISON:  MRI lumbar spine 02/11/2012 FINDINGS: CT THORACIC SPINE FINDINGS Alignment: No vertebral subluxation is observed. Thoracic kyphosis noted. Vertebrae: No thoracic spine fracture or acute bony finding. There is multilevel bridging spurring anterior to the thoracic spine at the T2 through T10 levels. Paraspinal and other soft tissues: Please see dedicated CT chest report. Disc levels: No substantial thoracic spine impingement is noted. There is left paracentral posterior intervertebral spurring at the T8-9 level but not causing central narrowing of the thecal sac. CT LUMBAR SPINE FINDINGS Segmentation: The lowest lumbar type non-rib-bearing vertebra is labeled as L5. Alignment: 5 mm of grade 1 degenerative anterolisthesis at L4-5. Vertebrae: Pathologic fracture at L1 with about 65% loss of vertebral body height, with the posterior contour of the vertebral body bulging back 6 mm in a manner highly suspicious for pathologic underlying tumor. The abnormal associated lucency in the vertebral body extends into both pedicles and into the transverse processes (right greater than left, with expansion of the pedicles and right transverse process. There is some mild extension into the right lamina on image 25 series 4. Mild degenerative endplate sclerosis at the L3-4 and L4-5 levels with loss of intervertebral disc height. No other lytic lesions are identified in the lumbar spine. Paraspinal and other soft tissues: Please see dedicated CT abdomen report. Disc levels: T12-L1: Borderline bilateral foraminal stenosis due to tumor expansion of the bilateral pedicles. Cannot exclude right subarticular lateral recess stenosis given the tumor expansion in this vicinity borderline central narrowing of the thecal sac due to posterior bony retropulsion/bulging. L1-2: No impingement. L2-3: No impingement.  Diffuse disc bulge. L3-4: No impingement.  Right eccentric disc bulge. L4-5:  Moderate to prominent right and mild left foraminal stenosis and borderline central narrowing of the thecal sac due to subluxation, disc uncovering, and facet arthropathy. L5-S1: No impingement.  Central disc protrusion. IMPRESSION: 1. L1 pathologic fracture with 65% loss of vertebral body height and posterior bulging of the vertebral body by 0.6 cm. Tumor involvement of the bilateral pedicles, right greater than left transverse process, and right lamina. Bony expansion at this level contributes to borderline impingement at T12-L1.  No other osseous metastatic disease in the thoracolumbar spine identified. 2. Moderate to prominent impingement at L4-5 due to subluxation with disc uncovering and facet arthropathy. Electronically Signed   By: Van Clines M.D.   On: 02/10/2022 17:37   CT L-SPINE NO CHARGE  Result Date: 02/10/2022 CLINICAL DATA:  Pathologic fracture at L1 EXAM: CT THORACIC AND LUMBAR SPINE WITHOUT CONTRAST TECHNIQUE: Multidetector CT imaging of the thoracic and lumbar spine was performed without contrast. Multiplanar CT image reconstructions were also generated. RADIATION DOSE REDUCTION: This exam was performed according to the departmental dose-optimization program which includes automated exposure control, adjustment of the mA and/or kV according to patient size and/or use of iterative reconstruction technique. COMPARISON:  MRI lumbar spine 02/11/2012 FINDINGS: CT THORACIC SPINE FINDINGS Alignment: No vertebral subluxation is observed. Thoracic kyphosis noted. Vertebrae: No thoracic spine fracture or acute bony finding. There is multilevel bridging spurring anterior to the thoracic spine at the T2 through T10 levels. Paraspinal and other soft tissues: Please see dedicated CT chest report. Disc levels: No substantial thoracic spine impingement is noted. There is left paracentral posterior intervertebral spurring at the T8-9 level but not causing central narrowing of the thecal sac. CT LUMBAR  SPINE FINDINGS Segmentation: The lowest lumbar type non-rib-bearing vertebra is labeled as L5. Alignment: 5 mm of grade 1 degenerative anterolisthesis at L4-5. Vertebrae: Pathologic fracture at L1 with about 65% loss of vertebral body height, with the posterior contour of the vertebral body bulging back 6 mm in a manner highly suspicious for pathologic underlying tumor. The abnormal associated lucency in the vertebral body extends into both pedicles and into the transverse processes (right greater than left, with expansion of the pedicles and right transverse process. There is some mild extension into the right lamina on image 25 series 4. Mild degenerative endplate sclerosis at the L3-4 and L4-5 levels with loss of intervertebral disc height. No other lytic lesions are identified in the lumbar spine. Paraspinal and other soft tissues: Please see dedicated CT abdomen report. Disc levels: T12-L1: Borderline bilateral foraminal stenosis due to tumor expansion of the bilateral pedicles. Cannot exclude right subarticular lateral recess stenosis given the tumor expansion in this vicinity borderline central narrowing of the thecal sac due to posterior bony retropulsion/bulging. L1-2: No impingement. L2-3: No impingement.  Diffuse disc bulge. L3-4: No impingement.  Right eccentric disc bulge. L4-5: Moderate to prominent right and mild left foraminal stenosis and borderline central narrowing of the thecal sac due to subluxation, disc uncovering, and facet arthropathy. L5-S1: No impingement.  Central disc protrusion. IMPRESSION: 1. L1 pathologic fracture with 65% loss of vertebral body height and posterior bulging of the vertebral body by 0.6 cm. Tumor involvement of the bilateral pedicles, right greater than left transverse process, and right lamina. Bony expansion at this level contributes to borderline impingement at T12-L1. No other osseous metastatic disease in the thoracolumbar spine identified. 2. Moderate to  prominent impingement at L4-5 due to subluxation with disc uncovering and facet arthropathy. Electronically Signed   By: Van Clines M.D.   On: 02/10/2022 17:37   CT CHEST ABDOMEN PELVIS W CONTRAST  Result Date: 02/10/2022 CLINICAL DATA:  Occult malignancy, staging workup.  Severe pain. * Tracking Code: BO * EXAM: CT CHEST, ABDOMEN, AND PELVIS WITH CONTRAST TECHNIQUE: Multidetector CT imaging of the chest, abdomen and pelvis was performed following the standard protocol during bolus administration of intravenous contrast. RADIATION DOSE REDUCTION: This exam was performed according to the departmental dose-optimization program which includes automated  exposure control, adjustment of the mA and/or kV according to patient size and/or use of iterative reconstruction technique. CONTRAST:  195mL OMNIPAQUE IOHEXOL 300 MG/ML  SOLN COMPARISON:  Lumbar MRI 02/10/2022 and CT abdomen pelvis 01/02/2013 FINDINGS: CT CHEST FINDINGS Cardiovascular: Coronary, aortic arch, and branch vessel atherosclerotic vascular disease. Mediastinum/Nodes: Centrally necrotic 3.1 by 2.4 by 2.9 cm anterior mediastinal mass in the left prevascular region on image 17 series 2, with mild mass effect on the brachiocephalic vein as shown on image 16 series 2. This is in the immediate vicinity of the phrenic nerve, and the elevation of the left hemidiaphragm is new compared to 08/11/2019 chest radiograph, raising suspicion for phrenic nerve impingement leading to diaphragmatic dysfunction. Left supraclavicular node, 1.0 cm in short axis on image 9 series 2. Right eccentric subcarinal node 1.0 cm in short axis on image 28 series 2. Lungs/Pleura: Scattered scarring and/or atelectasis in the left lower lobe and lingula. This is particularly notable anteriorly in the left lower lobe. Bronchiectasis and volume loss medially in the right middle lobe for example on image 98 series 3. Scattered tree-in-bud reticulonodular opacities are present in the  lungs, right greater than left, characteristic for atypical infectious bronchiolitis. In light of the pathologic adenopathy in the chest this raises questions as to which of the visualized small nodules might be malignant versus infectious. One of the largest nodules is a 12 by 7 by 11 mm (volume = 500 mm^3) nodule in the right lateral costophrenic angle on image 107 of series 3, with clustered reticulonodular opacities just cephalad to this lesion. Peripheral atelectasis versus mild pleural thickening posteriorly in the left lower lobe. Musculoskeletal: As noted above, the left hemidiaphragm is elevated, and the left anterior mediastinal mass which runs directly in the vicinity of the left phrenic nerve is implicated as a likely cause. Degenerative arthropathy at the sternoclavicular joints. Thoracic spondylosis. CT ABDOMEN PELVIS FINDINGS Hepatobiliary: Fluid density sharply defined 1.4 by 1.0 cm cyst posteriorly in segment 6 of the liver on image 59 series 2, this previously measured 0.6 cm in diameter back in 2014. Gallbladder unremarkable. No other liver lesion identified. Pancreas: Subtle abnormal hypoenhancement inferiorly in the pancreatic body measuring about 1.2 by 0.7 by 1.3 cm on image 38 of series 5. The pancreatic head is also mildly hypoenhancing. This could be inflammatory but infiltrative pancreatic adenocarcinoma cannot be excluded. This warrants detailed workup with either pancreatic protocol CT or MRI with and without contrast. No dorsal pancreatic duct dilatation identified. Spleen: Unremarkable Adrenals/Urinary Tract: Unremarkable Stomach/Bowel: Prominent stool throughout the colon favors constipation. Sigmoid colon diverticulosis is present and there is an inflamed diverticula and inflammatory findings the junction of the descending and sigmoid colon on image 31 series 5 suggesting mild acute diverticulitis. Tumor in this vicinity with local resulting inflammation is a differential diagnostic  consideration given the findings elsewhere in today's examination. Vascular/Lymphatic: Atherosclerosis is present, including aortoiliac atherosclerotic disease. There is atheromatous calcification but only mild stenosis proximally in the superior mesenteric artery, with no findings of occlusion. No pathologic adenopathy observed in the abdomen/pelvis. Reproductive: Unremarkable Other: No supplemental non-categorized findings. Musculoskeletal: As shown on the lumbar spine MRI, there is a pathologic fracture at L1 with diffuse abnormal lysis involving the vertebral body, bilateral pedicles, and bilateral transverse processes at this level with about 65% of vertebral body height and with a posterior bulging contour of the vertebral body by about 5-6 mm. Further detail on dedicated lumbar spine CT. IMPRESSION: 1. 3.1 cm centrally necrotic  anterior mediastinal mass in the left prevascular region with mild mass effect on the brachiocephalic vein. 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 There is also mild left supraclavicular adenopathy. 2. Scattered tree-in-bud reticulonodular opacities in the lungs, right greater than left, characteristic for atypical infectious bronchiolitis. One of the largest nodules is a 12 by 7 by 11 mm nodule in the right lateral costophrenic angle, which could be malignant or infectious. 3. Subtle abnormal hypoenhancement inferiorly in the pancreatic body measuring about 1.2 by 0.7 by 1.3 cm, along with some indistinct hypoenhancement in the pancreatic head. This could be inflammatory but infiltrative pancreatic adenocarcinoma cannot be excluded. This warrants detailed workup with either pancreatic protocol CT or MRI with and without contrast. PET-CT could also be helpful in this case. 4. As shown on lumbar spine MRI, there is a pathologic (malignant) fracture at L1  with diffuse abnormal lysis involving the vertebral body, bilateral pedicles, and bilateral transverse processes at this level with about 65% of vertebral body height and with a posterior bulging contour of the vertebral body by about 5-6 mm. Further detail on dedicated lumbar spine CT. 5. Mild acute sigmoid colon diverticulitis. Tumor in the vicinity of the local inflammation along the sigmoid colon is not totally excluded but is a less likely differential diagnostic consideration. 6. Aortic atherosclerosis.  Coronary atherosclerosis. Aortic Atherosclerosis (ICD10-I70.0). Electronically Signed   By: Van Clines M.D.   On: 02/10/2022 17:24   MR LUMBAR SPINE WO CONTRAST  Result Date: 02/10/2022 CLINICAL DATA:  Known compression fracture at L1. worsening pain EXAM: MRI LUMBAR SPINE WITHOUT CONTRAST TECHNIQUE: Multiplanar, multisequence MR imaging of the lumbar spine was performed. No intravenous contrast was administered. COMPARISON:  None Available. FINDINGS: Segmentation:  Standard. Alignment: Trace retrolisthesis of L3 on L4. Grade 1 anterolisthesis of L4 on L5. Vertebrae: There is a acute to subacute compression deformity at the L1 vertebral body level with posterior bulging of the cortex. This results in mild spinal canal narrowing at this level. There is diffuse T1 hypointense signal abnormality in the L1 vertebral body level which involve the bilateral pedicles, as well as the bilateral transverse processes (series 9, image 11). Cortex, particularly at the right transverse process is poorly visualized. Conus medullaris and cauda equina: Conus extends to the L1-L2 level. Conus and cauda equina appear normal. 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 There is atrophy of the paraspinal musculature. No retroperitoneal lymphadenopathy. Bilateral adrenal glands are normal in appearance. Disc levels: T11-T12: No  evidence of spinal canal or neural foraminal stenosis. T12-L1: Mild spinal canal stenosis. Mild bilateral neural foraminal stenosis. L1-L2: No spinal canal stenosis. Moderate right neural foraminal stenosis. No left neural foraminal stenosis. L2-L3: Circumferential disc bulge. Mild bilateral facet degenerative change. Mild spinal canal stenosis. No neural foraminal stenosis. L3-L4: Circumferential disc bulge. Mild bilateral facet degenerative change. Moderate left neural foraminal stenosis. No right neural foraminal stenosis. Mild spinal canal narrowing. L4-L5: Grade 1 anterolisthesis. Severe bilateral facet degenerative change. Severe right neural foraminal stenosis. Mild left neural foraminal stenosis. Mild spinal canal narrowing. L5-S1: No spinal canal or neural foraminal stenosis. IMPRESSION: Acute to subacute compression deformity at the L1 vertebral body level with posterior bulging of the cortex resulting in mild spinal canal stenosis at this level. There is diffuse T1 hypointense signal abnormality in  the L1 vertebral body with involvement of the bilateral pedicles, as well as the bilateral transverse processes. This raises concerns for a pathologic fracture. Recommend further evaluation with CT of the lumbar spine and correlate with history of malignancy. Electronically Signed   By: Marin Roberts M.D.   On: 02/10/2022 15:57            LOS: 4 days       Emeterio Reeve, DO [facility] 02/15/2022, 8:35 AM   Staff may message me via secure chat in [place]  but this may not receive immediate response,  please page for urgent matters!  If 7PM-7AM, please contact night-coverage [email or web address]  Dictation software was used to generate the above note. Typos may occur and escape review, as with typed/written notes. Please contact Dr Sheppard Coil directly for clarity if needed.

## 2022-02-15 NOTE — Progress Notes (Signed)
OT Cancellation Note  Patient Details Name: [name] MRN: [id number] DOB: December 05, 1950   Cancelled Treatment:    Reason Eval/Treat Not Completed: Patient at procedure or test/ unavailable. Pt having Thoracic fusion with Dr. Cari Caraway today. Will re-evaluate after surgery as appropriate.  Waymon Amato, MS, OTR/L   Vania Rea 02/15/2022, 1:21 PM

## 2022-02-15 NOTE — Anesthesia Postprocedure Evaluation (Signed)
Anesthesia Post Note  Patient: Amber Glenn  Procedure(s) Performed: POSTERIOR SPINAL FUSION F00-F1 APPLICATION OF INTRAOPERATIVE CT SCAN  Patient location during evaluation: PACU Anesthesia Type: General Level of consciousness: awake (agitated) Pain management: pain level controlled Vital Signs Assessment: post-procedure vital signs reviewed and stable Respiratory status: spontaneous breathing, respiratory function stable and patient connected to nasal cannula oxygen Cardiovascular status: blood pressure returned to baseline and tachycardic Postop Assessment: no apparent nausea or vomiting and no headache Anesthetic complications: yes Comments: Patient calm in PACU until spoken to, at which time she becomes angry and yells.  Denies pain.  Asking for her family.  Ordered basic labs and sitter, and discussed with receiving hospitalist.   Encounter Notable Events  Notable Event Outcome Phase Comment  Difficult to intubate - unexpected  Intraprocedure Filed from anesthesia note documentation.     Last Vitals:  Vitals:   02/15/22 1638 02/15/22 1645  BP:  106/80  Pulse:  (!) 121  Resp:  19  Temp:    SpO2: 95% 93%    Last Pain:  Vitals:   02/15/22 1239  TempSrc:   PainSc: [place]

## 2022-02-15 NOTE — Progress Notes (Signed)
PT Cancellation Note  Patient Details Name: Amber Glenn MRN: [id number] DOB: 12-30-1950   Cancelled Treatment:    Reason Eval/Treat Not Completed: Patient at procedure or test/unavailable  Pt awaiting procedure this am.  Declined therapy at this time.  Will continue tomorrow as appropriate.     Chesley Noon 02/15/2022, 12:53 PM

## 2022-02-15 NOTE — Op Note (Signed)
Indications: Amber Glenn is a 71 year old female who presented with a pathologic fracture at L1.  This caused lumbar spinal instability.  Due to this instability and her cancer related pain, I recommended surgical fixation.  Findings: Open reduction internal fixation L1 fracture  Preoperative Diagnosis: L1 pathologic fracture, lumbar spinal instability, cancer related pain Postoperative Diagnosis: same   EBL: 50 ml IVF: see AR ml Drains: none Disposition: Extubated and Stable to PACU Complications: none  No foley catheter was placed.   Preoperative Note:   Risks of surgery discussed include: infection, bleeding, stroke, coma, death, paralysis, CSF leak, nerve/spinal cord injury, numbness, tingling, weakness, complex regional pain syndrome, recurrent stenosis and/or disc herniation, vascular injury, development of instability, neck/back pain, need for further surgery, persistent symptoms, development of deformity, and the risks of anesthesia. The patient understood these risks and agreed to proceed.  NAME OF POSTERIOR PROCEDURE 1. Posterior instrumentation using Globus Creo Instrumentation 2. Posterolateral fusion, T11-L3 utilizing demineralized bone matrix 3. Use of Stereotaxis   PROCEDURE:  Patient was brought to the operating room, intubated, turned to the prone position.  All pressure points were checked and double-checked.    The posterior operative site was prepped and draped in standard fashion.  The stereotactic array was placed.  Stereotactic images were acquired using intraoperative CT scanning.  This was registered to the patient.  Using stereotaxis, screw trajectories were planned and incisions made.  The pedicles from T11 to L3 were cannulated bilaterally (excluding L1) and K wires used to secure the tracks.  We then utilized a stereotactic screwdriver to place pedicle screws from T11 to L3.  At each level, 6.5x39mm Globus Creo pedicle screws were placed.  Once the screws  were placed, the screw extensions were then linked, a path was formed for the rod and a rod was utilized to connect the screws.  We then compressed, torqued / counter-torqued and removed the screw assembly. Once performed on each side, the C-arm was brought back and to take confirmatory CT scan showing appropriate placement of all instrumentation and anatomic alignment.    By reducing the screws to the rods, open reduction internal fixation of the L1 fracture was achieved.  Posterolateral arthrodesis was performed at T11-L3 utilizing demineralized bone matrix.  Again we confirmed radiographically and began our closure.  The wound was closed using 0 Vicryl interrupted suture in the fascia, 2-0 Vicryl inverted suture were placed in the subcutaneous tissue and dermis. 3-0 monocryl was used for final closure. Dermabond was used to close the skin.    Needle, lap and all counts were correct at the end of the case.    Geronimo Boot PA assisted in the entire procedure. An assistant was required for this procedure due to the complexity.  The assistant provided assistance in tissue manipulation and suction, and was required for the successful and safe performance of the procedure. I performed the critical portions of the procedure.   Meade Maw MD Neurosurgery

## 2022-02-15 NOTE — Transfer of Care (Signed)
Immediate Anesthesia Transfer of Care Note  Patient: Amber Glenn  Procedure(s) Performed: POSTERIOR SPINAL FUSION X11-B5 APPLICATION OF INTRAOPERATIVE CT SCAN  Patient Location: PACU  Anesthesia Type:General  Level of Consciousness: awake and drowsy  Airway & Oxygen Therapy: Patient Spontanous Breathing and Patient connected to face mask oxygen  Post-op Assessment: Report given to RN and Post -op Vital signs reviewed and stable  Post vital signs: Reviewed and stable  Last Vitals:  Vitals Value Taken Time  BP    Temp    Pulse    Resp    SpO2      Last Pain:  Vitals:   02/15/22 1239  TempSrc:   PainSc: 7       Patients Stated Pain Goal: 0 (20/80/22 3361)  Complications:  Encounter Notable Events  Notable Event Outcome Phase Comment  Difficult to intubate - unexpected  Intraprocedure Filed from anesthesia note documentation.

## 2022-02-16 ENCOUNTER — Inpatient Hospital Stay: Payer: Medicare HMO

## 2022-02-16 ENCOUNTER — Other Ambulatory Visit: Payer: Self-pay | Admitting: Anatomic Pathology & Clinical Pathology

## 2022-02-16 ENCOUNTER — Encounter: Payer: Self-pay | Admitting: Neurosurgery

## 2022-02-16 DIAGNOSIS — M8458XA Pathological fracture in neoplastic disease, other specified site, initial encounter for fracture: Secondary | ICD-10-CM | POA: Diagnosis not present

## 2022-02-16 LAB — CBC
HCT: 31.5 % — ABNORMAL LOW (ref 36.0–46.0)
Hemoglobin: 9.7 g/dL — ABNORMAL LOW (ref 12.0–15.0)
MCH: 24.2 pg — ABNORMAL LOW (ref 26.0–34.0)
MCHC: 30.8 g/dL (ref 30.0–36.0)
MCV: 78.6 fL — ABNORMAL LOW (ref 80.0–100.0)
Platelets: 295 10*3/uL (ref 150–400)
RBC: 4.01 MIL/uL (ref 3.87–5.11)
RDW: 16.7 % — ABNORMAL HIGH (ref 11.5–15.5)
WBC: 12.8 10*3/uL — ABNORMAL HIGH (ref 4.0–10.5)
nRBC: 0 % (ref 0.0–0.2)

## 2022-02-16 LAB — GLUCOSE, CAPILLARY
Glucose-Capillary: 125 mg/dL — ABNORMAL HIGH (ref 70–99)
Glucose-Capillary: 97 mg/dL (ref 70–99)
Glucose-Capillary: 119 mg/dL — ABNORMAL HIGH (ref 70–99)
Glucose-Capillary: 113 mg/dL — ABNORMAL HIGH (ref 70–99)

## 2022-02-16 LAB — BASIC METABOLIC PANEL
Anion gap: 6 (ref 5–15)
BUN: 13 mg/dL (ref 8–23)
CO2: 23 mmol/L (ref 22–32)
Calcium: 8.8 mg/dL — ABNORMAL LOW (ref 8.9–10.3)
Chloride: 108 mmol/L (ref 98–111)
Creatinine, Ser: 0.54 mg/dL (ref 0.44–1.00)
GFR, Estimated: >60 mL/min (ref >60)
Glucose, Bld: 121 mg/dL — ABNORMAL HIGH (ref 70–99)
Potassium: 4.5 mmol/L (ref 3.5–5.1)
Sodium: 137 mmol/L (ref 135–145)

## 2022-02-16 LAB — SURGICAL PATHOLOGY

## 2022-02-16 MED ORDER — KETOROLAC TROMETHAMINE 30 MG/ML IJ SOLN
30.0000 mg | Freq: Once | INTRAMUSCULAR | Status: AC
  Administered 2022-02-16: 30 mg via INTRAVENOUS
  Filled 2022-02-16: qty 1

## 2022-02-16 MED ORDER — HEPARIN SODIUM (PORCINE) 5000 UNIT/ML IJ SOLN
5000.0000 [IU] | Freq: Three times a day (TID) | INTRAMUSCULAR | Status: DC
  Administered 2022-02-16 – 2022-02-17 (×3): 5000 [IU] via SUBCUTANEOUS
  Filled 2022-02-16 (×3): qty 1

## 2022-02-16 MED ORDER — GADOBUTROL 1 MMOL/ML IV SOLN
7.0000 mL | Freq: Once | INTRAVENOUS | Status: AC | PRN
  Administered 2022-02-16: 7 mL via INTRAVENOUS

## 2022-02-16 MED ORDER — CELECOXIB 200 MG PO CAPS
200.0000 mg | ORAL_CAPSULE | Freq: Two times a day (BID) | ORAL | Status: DC
  Administered 2022-02-16 – 2022-02-18 (×5): 200 mg via ORAL
  Filled 2022-02-16 (×5): qty 1

## 2022-02-16 NOTE — Progress Notes (Signed)
PROGRESS NOTE    BREIANA Glenn   ZHG:[id number] DOB: 06-Aug-1950  DOA: 02/10/2022 Date of Service: 02/16/22 PCP: Lynnell Jude, MD     Brief Narrative / Hospital Course:  [name] is a 71 year old female with history of hypertension, hyperlipidemia, non-insulin-dependent diabetes mellitus, depression, anxiety, who presents emergency department for chief concerns of low back pain not relieved with home pain medications. 11/01: in ED, afebrile, tachycardic 110 to 130 bpm, hypotensive 157/91, SPO2 100% on RA.  Unremarkable BMP, mild anemia Hgb 11.5 and MCV 79.  UA positive leukocytes, nitrate, with 11-20 WBC/hpf. Imaging: MRI lumbar spine: Acute/subacute compression deformity of L1, mild spinal canal stenosis due to posterior bulging cortex.  Diffuse T1 hypointense abnormality in the L1 vertebral body/pedicle/TP, raising concerns for pathologic fracture.  CT lumbar spine demonstrated tumor involvement of bilateral pedicles, right greater than left TP, right lamina with bony expansion at this level contributing to borderline impingement at T12/L1.  CT chest/abdomen/pelvis demonstrated 3.1 cm centrally necrotic mediastinal mass left prevascular region, scattered reticulonodular opacities in the lungs concerning for atypical infectious bronchiolitis, larger nodule in the right lateral costophrenic angle malignant/infectious, inflammatory pancreatic changes versus infiltrative pancreatic adenocarcinoma, pathologic lumbar spine fracture as noted above, mild acute sigmoid colon diverticulitis and tumor not excluded in this area. ED treatment: Keflex 500 mg p.o. one-time dose, fentanyl 50 mcg one-time dose, morphine one-time dose, Dilaudid 0.5 mg IV one-time dose, ondansetron. 11/02: US guided lymph node biopsy. Pain control has been difficult.  11/03: Neurosurgery planning for intervention on Monday (today is Friday). Tapering down on dilaudid.  11/04-11/05: stable.  Pain controlled.  11/06:  posterior thoracic fusion.  11/07: Lymph node pathology (+)lung adenocarcinoma - Dr Janese Banks Oncology following and will d/w patient. MRI brain w/wo ordered. Pain control a challenge, scheduled NSAID and opiate. Per discussion w/ neurosurgery expect may be appropriate for d/c tomorrow from their standpoint, TOC working on home health, PT/OT to follow.     Consultants:  Oncology Neurosurgery  IR  Procedures: US guided lymph node biopsy 02/11/22 w/ IR  02/15/22 posterior thoracic fusion 4 levels w/ Dr Izora Ribas (neurosurgery)       ASSESSMENT & PLAN:   Principal Problem:   Pathologic fracture Active Problems:   Diabetes mellitus type 2, controlled ([place])   Hyperlipidemia   GERD   Hypertension   Pyuria   Pathologic fracture L1 due to tumor infiltration, likely lung cancer primary, with diffuse abnormal lysis involving vertebral body and bilateral pedicles Evidence for neoplastic disease mediastinal/supraclavicular area, pancreas, lungs, L1 vertebra Supraclavicular biopsy (+)lung adeno  Oncology Dr. Janese Banks following Pain control - schedule NSAID and opiate po, prn IV dilaudid to wean as able IR 11/02 biopsy mediastinal mass --> path pending as of AM 02/16/22 --> (+)lung adeno S/p fusion w/ neurosurgery 11/06  MRI brain pending    Hypertension Losartan 100 mg daily resumed, carvedilol 3.125 mg p.o. twice daily resumed Hydralazine 5 mg IV every 8 hours prn   Hyperlipidemia Simvastatin 40 mg daily resumed   Diabetes mellitus type 2, controlled ([facility]) Insulin SSI with at bedtime coverage ordered Pt has requested regular diet, I have obliged, will adjust insulin as needed   E Coli UTI D/c IV abx, started cefdinir to finish course    DVT prophylaxis: heparin Pertinent IV fluids/nutrition: tolerating po, no continuous IV fluids (was on IV fluids yesterday postop while not eating).  Central lines / invasive devices: none   Code Status: FULL CODE Family Communication: grandson at  bedside on rounds  Disposition: inpatient  TOC needs: home health Barriers to discharge / significant pending items: fusion procedure yesterday, requiring IV pain control weaning as tolerated, PT/OT to follow, currently recs for [facility], anticipate d/c once pain controlled on po meds and pending ay changes in PT/OT recs              Subjective:  Pt moving around in bed okay at the moment, reports pain in her back but was up walking w/ PT. No other complaints at this time.        Objective:  Vitals:   02/15/22 2337 02/16/22 0509 02/16/22 0753 02/16/22 0850  BP: (!) 151/67 (!) 155/72 (!) 146/69 138/69  Pulse: 100 (!) 102 (!) 118 100  Resp: 20 20    Temp: 98.3 F (36.8 C) 98.5 F (36.9 C) 97.7 F (36.5 C) 97.9 F (36.6 C)  TempSrc:    Oral  SpO2: 94% 96% 90% 92%  Weight:      Height:        Intake/Output Summary (Last 24 hours) at 02/16/2022 1325 Last data filed at 02/16/2022 0737 Gross per 24 hour  Intake 1970.16 ml  Output 650 ml  Net 1320.16 ml    Filed Weights   02/10/22 1120 02/10/22 2031  Weight: 59.4 kg 59.4 kg    Examination:  Constitutional:  VS as above General Appearance: alert, well-developed, well-nourished, NAD Respiratory: Normal respiratory effort Cardiovascular: S1/S2 normal No murmur Gastrointestinal: No tenderness Musculoskeletal:  No clubbing/cyanosis of digits Symmetrical movement in all extremities Neurological: No cranial nerve deficit on limited exam Alert Psychiatric: Normal judgment/insight Normal mood and affect       Scheduled Medications:   vitamin C  1,000 mg Oral Daily   carvedilol  3.125 mg Oral BID WC   cefdinir  300 mg Oral Q12H   celecoxib  200 mg Oral BID   cyclobenzaprine  10 mg Oral TID   feeding supplement (GLUCERNA SHAKE)  237 mL Oral TID BM   gabapentin  100 mg Oral TID   heparin  5,000 Units Subcutaneous Q8H   HYDROcodone-acetaminophen  1 tablet Oral Q6H    HYDROmorphone (DILAUDID) injection   0.5 mg Intravenous Once   influenza vaccine adjuvanted  0.5 mL Intramuscular Tomorrow-1000   insulin aspart  0-15 Units Subcutaneous TID WC   insulin aspart  0-5 Units Subcutaneous QHS   metFORMIN  500 mg Oral Q breakfast   multivitamin with minerals  1 tablet Oral Daily   pantoprazole  40 mg Oral Daily   polyethylene glycol  17 g Oral Daily   senna-docusate  2 tablet Oral BID   simvastatin  40 mg Oral q1800    Continuous Infusions:    PRN Medications:  alum & mag hydroxide-simeth, HYDROmorphone (DILAUDID) injection, lactulose  Antimicrobials:  Anti-infectives (From admission, onward)    Start     Dose/Rate Route Frequency Ordered Stop   02/15/22 1257  ceFAZolin (ANCEF) 2-4 GM/100ML-% IVPB       Note to Pharmacy: Shary Key C: cabinet override      02/15/22 1257 02/16/22 0059   02/13/22 1000  cefdinir (OMNICEF) capsule 300 mg        300 mg Oral Every 12 hours 02/13/22 0829 02/17/22 0959   02/11/22 1600  cefTRIAXone (ROCEPHIN) 1 g in sodium chloride 0.9 % 100 mL IVPB  Status:  Discontinued        1 g 200 mL/hr over 30 Minutes Intravenous Every 24 hours 02/11/22 1514  02/13/22 0829   02/10/22 1800  azithromycin (ZITHROMAX) tablet 500 mg        500 mg Oral  Once 02/10/22 1758 02/10/22 1826   02/10/22 1530  cephALEXin (KEFLEX) capsule 500 mg        500 mg Oral  Once 02/10/22 1523 02/10/22 1551       Data Reviewed: I have personally reviewed following labs and imaging studies  CBC: Recent Labs  Lab 02/10/22 1148 02/11/22 0322 02/12/22 0343 02/13/22 0516 02/15/22 0428 02/15/22 1712 02/16/22 0340  WBC 9.7 6.9 8.1  --  6.3 10.7* 12.8*  NEUTROABS 8.3*  --   --   --   --   --   --   HGB 11.5* 10.5* 9.9* 9.5* 9.5* 10.3* 9.7*  HCT 38.4 35.5* 32.6* 31.2* 30.7* 34.3* 31.5*  MCV 79.2* 80.1 79.1*  --  78.5* 78.7* 78.6*  PLT 265 273 281  --  273 276 416   Basic Metabolic Panel: Recent Labs  Lab 02/11/22 0322 02/12/22 0343 02/15/22 0428 02/15/22 1712 02/16/22 0340  NA  137 135 134* 138 137  K 4.6 4.4 4.2 4.2 4.5  CL 102 100 103 108 108  CO2 27 28 25 24 23   GLUCOSE 119* 103* 91 115* 121*  BUN 13 19 23 18 13   CREATININE 0.60 0.88 0.96 0.70 0.54  CALCIUM 9.6 9.6 9.6 9.1 8.8*   GFR: Estimated Creatinine Clearance: 50.5 mL/min (by C-G formula based on SCr of 0.54 mg/dL). Liver Function Tests: No results for input(s): "AST", "ALT", "ALKPHOS", "BILITOT", "PROT", "ALBUMIN" in the last 168 hours. No results for input(s): "LIPASE", "AMYLASE" in the last 168 hours. No results for input(s): "AMMONIA" in the last 168 hours. Coagulation Profile: No results for input(s): "INR", "PROTIME" in the last 168 hours. Cardiac Enzymes: No results for input(s): "CKTOTAL", "CKMB", "CKMBINDEX", "TROPONINI" in the last 168 hours. BNP (last 3 results) No results for input(s): "PROBNP" in the last 8760 hours. HbA1C: No results for input(s): "HGBA1C" in the last 72 hours.  CBG: Recent Labs  Lab 02/15/22 1135 02/15/22 1544 02/15/22 2204 02/16/22 0832 02/16/22 1223  GLUCAP 87 104* 198* 125* 97   Lipid Profile: No results for input(s): "CHOL", "HDL", "LDLCALC", "TRIG", "CHOLHDL", "LDLDIRECT" in the last 72 hours. Thyroid Function Tests: No results for input(s): "TSH", "T4TOTAL", "FREET4", "T3FREE", "THYROIDAB" in the last 72 hours. Anemia Panel: No results for input(s): "VITAMINB12", "FOLATE", "FERRITIN", "TIBC", "IRON", "RETICCTPCT" in the last 72 hours. Urine analysis:    Component Value Date/Time   COLORURINE YELLOW (A) 02/10/2022 1148   APPEARANCEUR HAZY (A) 02/10/2022 1148   LABSPEC 1.026 02/10/2022 1148   PHURINE 5.0 02/10/2022 1148   GLUCOSEU NEGATIVE 02/10/2022 1148   HGBUR NEGATIVE 02/10/2022 1148   BILIRUBINUR NEGATIVE 02/10/2022 1148   KETONESUR 20 (A) 02/10/2022 1148   PROTEINUR NEGATIVE 02/10/2022 1148   NITRITE POSITIVE (A) 02/10/2022 1148   LEUKOCYTESUR SMALL (A) 02/10/2022 1148   Sepsis Labs: @LABRCNTIP (procalcitonin:4,lacticidven:4)  Recent  Results (from the past 240 hour(s))  Urine Culture     Status: Abnormal   Collection Time: 02/10/22 11:48 AM   Specimen: Urine, Clean Catch  Result Value Ref Range Status   Specimen Description   Final    URINE, CLEAN CATCH Performed at [facility], [address] [place], [place] [address]    Special Requests   Final    NONE Performed at [facility], [address] [place], [place] [address]    Culture >=100,000 COLONIES/mL  ESCHERICHIA COLI (A)  Final   Report Status 02/12/2022 FINAL  Final   Organism ID, Bacteria ESCHERICHIA COLI (A)  Final      Susceptibility   Escherichia coli - MIC*    AMPICILLIN >=32 RESISTANT Resistant     CEFAZOLIN <=4 SENSITIVE Sensitive     CEFEPIME <=0.12 SENSITIVE Sensitive     CEFTRIAXONE <=0.25 SENSITIVE Sensitive     CIPROFLOXACIN <=0.25 SENSITIVE Sensitive     GENTAMICIN <=1 SENSITIVE Sensitive     IMIPENEM <=0.25 SENSITIVE Sensitive     NITROFURANTOIN <=16 SENSITIVE Sensitive     TRIMETH/SULFA <=20 SENSITIVE Sensitive     AMPICILLIN/SULBACTAM 16 INTERMEDIATE Intermediate     PIP/TAZO <=4 SENSITIVE Sensitive     * >=100,000 COLONIES/mL ESCHERICHIA COLI  Surgical pcr screen     Status: None   Collection Time: 02/14/22 11:56 AM   Specimen: Nasal Mucosa; Nasal Swab  Result Value Ref Range Status   MRSA, PCR NEGATIVE NEGATIVE Final   Staphylococcus aureus NEGATIVE NEGATIVE Final    Comment: (NOTE) The Xpert SA Assay (FDA approved for NASAL specimens in patients 51 years of age and older), is one component of a comprehensive surveillance program. It is not intended to diagnose infection nor to guide or monitor treatment. Performed at [facility], [address] [place], [place] [address]          Radiology Studies: CT T-SPINE NO CHARGE  Result Date: 02/10/2022 CLINICAL DATA:  Pathologic fracture at L1 EXAM: CT THORACIC AND LUMBAR SPINE WITHOUT CONTRAST TECHNIQUE: Multidetector CT imaging of the  thoracic and lumbar spine was performed without contrast. Multiplanar CT image reconstructions were also generated. RADIATION DOSE REDUCTION: This exam was performed according to the departmental dose-optimization program which includes automated exposure control, adjustment of the mA and/or kV according to patient size and/or use of iterative reconstruction technique. COMPARISON:  MRI lumbar spine 02/11/2012 FINDINGS: CT THORACIC SPINE FINDINGS Alignment: No vertebral subluxation is observed. Thoracic kyphosis noted. Vertebrae: No thoracic spine fracture or acute bony finding. There is multilevel bridging spurring anterior to the thoracic spine at the T2 through T10 levels. Paraspinal and other soft tissues: Please see dedicated CT chest report. Disc levels: No substantial thoracic spine impingement is noted. There is left paracentral posterior intervertebral spurring at the T8-9 level but not causing central narrowing of the thecal sac. CT LUMBAR SPINE FINDINGS Segmentation: The lowest lumbar type non-rib-bearing vertebra is labeled as L5. Alignment: 5 mm of grade 1 degenerative anterolisthesis at L4-5. Vertebrae: Pathologic fracture at L1 with about 65% loss of vertebral body height, with the posterior contour of the vertebral body bulging back 6 mm in a manner highly suspicious for pathologic underlying tumor. The abnormal associated lucency in the vertebral body extends into both pedicles and into the transverse processes (right greater than left, with expansion of the pedicles and right transverse process. There is some mild extension into the right lamina on image 25 series 4. Mild degenerative endplate sclerosis at the L3-4 and L4-5 levels with loss of intervertebral disc height. No other lytic lesions are identified in the lumbar spine. Paraspinal and other soft tissues: Please see dedicated CT abdomen report. Disc levels: T12-L1: Borderline bilateral foraminal stenosis due to tumor expansion of the  bilateral pedicles. Cannot exclude right subarticular lateral recess stenosis given the tumor expansion in this vicinity borderline central narrowing of the thecal sac due to posterior bony retropulsion/bulging. L1-2: No impingement. L2-3: No impingement.  Diffuse disc bulge. L3-4: No impingement.  Right eccentric disc bulge. L4-5: Moderate to prominent right and mild left foraminal stenosis and borderline central narrowing of the thecal sac due to subluxation, disc uncovering, and facet arthropathy. L5-S1: No impingement.  Central disc protrusion. IMPRESSION: 1. L1 pathologic fracture with 65% loss of vertebral body height and posterior bulging of the vertebral body by 0.6 cm. Tumor involvement of the bilateral pedicles, right greater than left transverse process, and right lamina. Bony expansion at this level contributes to borderline impingement at T12-L1. No other osseous metastatic disease in the thoracolumbar spine identified. 2. Moderate to prominent impingement at L4-5 due to subluxation with disc uncovering and facet arthropathy. Electronically Signed   By: Van Clines M.D.   On: 02/10/2022 17:37   CT L-SPINE NO CHARGE  Result Date: 02/10/2022 CLINICAL DATA:  Pathologic fracture at L1 EXAM: CT THORACIC AND LUMBAR SPINE WITHOUT CONTRAST TECHNIQUE: Multidetector CT imaging of the thoracic and lumbar spine was performed without contrast. Multiplanar CT image reconstructions were also generated. RADIATION DOSE REDUCTION: This exam was performed according to the departmental dose-optimization program which includes automated exposure control, adjustment of the mA and/or kV according to patient size and/or use of iterative reconstruction technique. COMPARISON:  MRI lumbar spine 02/11/2012 FINDINGS: CT THORACIC SPINE FINDINGS Alignment: No vertebral subluxation is observed. Thoracic kyphosis noted. Vertebrae: No thoracic spine fracture or acute bony finding. There is multilevel bridging spurring  anterior to the thoracic spine at the T2 through T10 levels. Paraspinal and other soft tissues: Please see dedicated CT chest report. Disc levels: No substantial thoracic spine impingement is noted. There is left paracentral posterior intervertebral spurring at the T8-9 level but not causing central narrowing of the thecal sac. CT LUMBAR SPINE FINDINGS Segmentation: The lowest lumbar type non-rib-bearing vertebra is labeled as L5. Alignment: 5 mm of grade 1 degenerative anterolisthesis at L4-5. Vertebrae: Pathologic fracture at L1 with about 65% loss of vertebral body height, with the posterior contour of the vertebral body bulging back 6 mm in a manner highly suspicious for pathologic underlying tumor. The abnormal associated lucency in the vertebral body extends into both pedicles and into the transverse processes (right greater than left, with expansion of the pedicles and right transverse process. There is some mild extension into the right lamina on image 25 series 4. Mild degenerative endplate sclerosis at the L3-4 and L4-5 levels with loss of intervertebral disc height. No other lytic lesions are identified in the lumbar spine. Paraspinal and other soft tissues: Please see dedicated CT abdomen report. Disc levels: T12-L1: Borderline bilateral foraminal stenosis due to tumor expansion of the bilateral pedicles. Cannot exclude right subarticular lateral recess stenosis given the tumor expansion in this vicinity borderline central narrowing of the thecal sac due to posterior bony retropulsion/bulging. L1-2: No impingement. L2-3: No impingement.  Diffuse disc bulge. L3-4: No impingement.  Right eccentric disc bulge. L4-5: Moderate to prominent right and mild left foraminal stenosis and borderline central narrowing of the thecal sac due to subluxation, disc uncovering, and facet arthropathy. L5-S1: No impingement.  Central disc protrusion. IMPRESSION: 1. L1 pathologic fracture with 65% loss of vertebral body  height and posterior bulging of the vertebral body by 0.6 cm. Tumor involvement of the bilateral pedicles, right greater than left transverse process, and right lamina. Bony expansion at this level contributes to borderline impingement at T12-L1. No other osseous metastatic disease in the thoracolumbar spine identified. 2. Moderate to prominent impingement at L4-5 due to subluxation with disc uncovering and facet arthropathy. Electronically Signed  By: Van Clines M.D.   On: 02/10/2022 17:37   CT CHEST ABDOMEN PELVIS W CONTRAST  Result Date: 02/10/2022 CLINICAL DATA:  Occult malignancy, staging workup.  Severe pain. * Tracking Code: BO * EXAM: CT CHEST, ABDOMEN, AND PELVIS WITH CONTRAST TECHNIQUE: Multidetector CT imaging of the chest, abdomen and pelvis was performed following the standard protocol during bolus administration of intravenous contrast. RADIATION DOSE REDUCTION: This exam was performed according to the departmental dose-optimization program which includes automated exposure control, adjustment of the mA and/or kV according to patient size and/or use of iterative reconstruction technique. CONTRAST:  137mL OMNIPAQUE IOHEXOL 300 MG/ML  SOLN COMPARISON:  Lumbar MRI 02/10/2022 and CT abdomen pelvis 01/02/2013 FINDINGS: CT CHEST FINDINGS Cardiovascular: Coronary, aortic arch, and branch vessel atherosclerotic vascular disease. Mediastinum/Nodes: Centrally necrotic 3.1 by 2.4 by 2.9 cm anterior mediastinal mass in the left prevascular region on image 17 series 2, with mild mass effect on the brachiocephalic vein as shown on image 16 series 2. This is in the immediate vicinity of the phrenic nerve, and the elevation of the left hemidiaphragm is new compared to 08/11/2019 chest radiograph, raising suspicion for phrenic nerve impingement leading to diaphragmatic dysfunction. Left supraclavicular node, 1.0 cm in short axis on image 9 series 2. Right eccentric subcarinal node 1.0 cm in short axis on  image 28 series 2. Lungs/Pleura: Scattered scarring and/or atelectasis in the left lower lobe and lingula. This is particularly notable anteriorly in the left lower lobe. Bronchiectasis and volume loss medially in the right middle lobe for example on image 98 series 3. Scattered tree-in-bud reticulonodular opacities are present in the lungs, right greater than left, characteristic for atypical infectious bronchiolitis. In light of the pathologic adenopathy in the chest this raises questions as to which of the visualized small nodules might be malignant versus infectious. One of the largest nodules is a 12 by 7 by 11 mm (volume = 500 mm^3) nodule in the right lateral costophrenic angle on image 107 of series 3, with clustered reticulonodular opacities just cephalad to this lesion. Peripheral atelectasis versus mild pleural thickening posteriorly in the left lower lobe. Musculoskeletal: As noted above, the left hemidiaphragm is elevated, and the left anterior mediastinal mass which runs directly in the vicinity of the left phrenic nerve is implicated as a likely cause. Degenerative arthropathy at the sternoclavicular joints. Thoracic spondylosis. CT ABDOMEN PELVIS FINDINGS Hepatobiliary: Fluid density sharply defined 1.4 by 1.0 cm cyst posteriorly in segment 6 of the liver on image 59 series 2, this previously measured 0.6 cm in diameter back in 2014. Gallbladder unremarkable. No other liver lesion identified. Pancreas: Subtle abnormal hypoenhancement inferiorly in the pancreatic body measuring about 1.2 by 0.7 by 1.3 cm on image 38 of series 5. The pancreatic head is also mildly hypoenhancing. This could be inflammatory but infiltrative pancreatic adenocarcinoma cannot be excluded. This warrants detailed workup with either pancreatic protocol CT or MRI with and without contrast. No dorsal pancreatic duct dilatation identified. Spleen: Unremarkable Adrenals/Urinary Tract: Unremarkable Stomach/Bowel: Prominent stool  throughout the colon favors constipation. Sigmoid colon diverticulosis is present and there is an inflamed diverticula and inflammatory findings the junction of the descending and sigmoid colon on image 31 series 5 suggesting mild acute diverticulitis. Tumor in this vicinity with local resulting inflammation is a differential diagnostic consideration given the findings elsewhere in today's examination. Vascular/Lymphatic: Atherosclerosis is present, including aortoiliac atherosclerotic disease. There is atheromatous calcification but only mild stenosis proximally in the superior mesenteric artery, with no  findings of occlusion. No pathologic adenopathy observed in the abdomen/pelvis. Reproductive: Unremarkable Other: No supplemental non-categorized findings. Musculoskeletal: As shown on the lumbar spine MRI, there is a pathologic fracture at L1 with diffuse abnormal lysis involving the vertebral body, bilateral pedicles, and bilateral transverse processes at this level with about 65% of vertebral body height and with a posterior bulging contour of the vertebral body by about 5-6 mm. Further detail on dedicated lumbar spine CT. IMPRESSION: 1. 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 There is also mild left supraclavicular adenopathy. 2. Scattered tree-in-bud reticulonodular opacities in the lungs, right greater than left, characteristic for atypical infectious bronchiolitis. One of the largest nodules is a 12 by 7 by 11 mm nodule in the right lateral costophrenic angle, which could be malignant or infectious. 3. Subtle abnormal hypoenhancement inferiorly in the pancreatic body measuring about 1.2 by 0.7 by 1.3 cm, along with some indistinct  hypoenhancement in the pancreatic head. This could be inflammatory but infiltrative pancreatic adenocarcinoma cannot be excluded. This warrants detailed workup with either pancreatic protocol CT or MRI with and without contrast. PET-CT could also be helpful in this case. 4. As shown on lumbar spine MRI, there is a pathologic (malignant) fracture at L1 with diffuse abnormal lysis involving the vertebral body, bilateral pedicles, and bilateral transverse processes at this level with about 65% of vertebral body height and with a posterior bulging contour of the vertebral body by about 5-6 mm. Further detail on dedicated lumbar spine CT. 5. Mild acute sigmoid colon diverticulitis. Tumor in the vicinity of the local inflammation along the sigmoid colon is not totally excluded but is a less likely differential diagnostic consideration. 6. Aortic atherosclerosis.  Coronary atherosclerosis. Aortic Atherosclerosis (ICD10-I70.0). Electronically Signed   By: Van Clines M.D.   On: 02/10/2022 17:24   MR LUMBAR SPINE WO CONTRAST  Result Date: 02/10/2022 CLINICAL DATA:  Known compression fracture at L1. worsening pain EXAM: MRI LUMBAR SPINE WITHOUT CONTRAST TECHNIQUE: Multiplanar, multisequence MR imaging of the lumbar spine was performed. No intravenous contrast was administered. COMPARISON:  None Available. FINDINGS: Segmentation:  Standard. Alignment: Trace retrolisthesis of L3 on L4. Grade 1 anterolisthesis of L4 on L5. Vertebrae: There is a acute to subacute compression deformity at the L1 vertebral body level with posterior bulging of the cortex. This results in mild spinal canal narrowing at this level. There is diffuse T1 hypointense signal abnormality in the L1 vertebral body level which involve the bilateral pedicles, as well as the bilateral transverse processes (series 9, image 11). Cortex, particularly at the right transverse process is poorly visualized. Conus medullaris and cauda equina: Conus extends to  the L1-L2 level. Conus and cauda equina appear normal. 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 There is atrophy of the paraspinal musculature. No retroperitoneal lymphadenopathy. Bilateral adrenal glands are normal in appearance. Disc levels: T11-T12: No evidence of spinal canal or neural foraminal stenosis. T12-L1: Mild spinal canal stenosis. Mild bilateral neural foraminal stenosis. L1-L2: No spinal canal stenosis. Moderate right neural foraminal stenosis. No left neural foraminal stenosis. L2-L3: Circumferential disc bulge. Mild bilateral facet degenerative change. Mild spinal canal stenosis. No neural foraminal stenosis. L3-L4: Circumferential disc bulge. Mild bilateral  facet degenerative change. Moderate left neural foraminal stenosis. No right neural foraminal stenosis. Mild spinal canal narrowing. L4-L5: Grade 1 anterolisthesis. Severe bilateral facet degenerative change. Severe right neural foraminal stenosis. Mild left neural foraminal stenosis. Mild spinal canal narrowing. L5-S1: No spinal canal or neural foraminal stenosis. IMPRESSION: Acute to subacute compression deformity at the L1 vertebral body level with posterior bulging of the cortex resulting in mild spinal canal stenosis at this level. There is diffuse T1 hypointense signal abnormality in the L1 vertebral body with involvement of the bilateral pedicles, as well as the bilateral transverse processes. This raises concerns for a pathologic fracture. Recommend further evaluation with CT of the lumbar spine and correlate with history of malignancy. Electronically Signed   By: Marin Roberts M.D.   On: 02/10/2022 15:57            LOS: 5 days       Emeterio Reeve, DO [facility] 02/16/2022, 1:25 PM   Staff may message me via secure chat in [place]  but this may not receive immediate response,  please page for urgent matters!  If  7PM-7AM, please contact night-coverage [email or web address]  Dictation software was used to generate the above note. Typos may occur and escape review, as with typed/written notes. Please contact Dr Sheppard Coil directly for clarity if needed.

## 2022-02-16 NOTE — Care Management Important Message (Signed)
Important Message  Patient Details  Name: Amber Glenn MRN: [id number] Date of Birth: 23-Nov-1950   Medicare Important Message Given:  Yes     [name] 02/16/2022, 11:40 AM

## 2022-02-16 NOTE — Progress Notes (Signed)
    Attending Progress Note  History: Amber Glenn is here for pathologic L1 fracture.  POD1: Doing well.  Expected pain  Physical Exam: Vitals:   02/15/22 2337 02/16/22 0509  BP: (!) 151/67 (!) 155/72  Pulse: 100 (!) 102  Resp: 20 20  Temp: 98.3 F (36.8 C) 98.5 F (36.9 C)  SpO2: 94% 96%    AA Ox3 CNI  Strength:5/5 throughout BLE  Data:  Other tests/results: see prior notes  Assessment/Plan:  Amber Glenn is here with an unstable pathologic L1 fracture.  - mobilize - pain control - DVT prophylaxis ok to restart - PTOT - Wear brace for comfort - not required now that she has had fixation    Amber Maw MD, [facility] Department of Neurosurgery

## 2022-02-16 NOTE — Evaluation (Signed)
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  Now s/p T11-L3 posterior fusion (02/15/22). PMH significant for hypertension, hyperlipidemia, non-insulin-dependent diabetes mellitus, depression, anxiety  Clinical Impression  Patient resting in bed upon arrival to room; grandson at beside, very supportive and attentive to patient.  Patient easily awakens to voice, light touch but requires consistent cuing/stimulation to maintain alertness.  Oriented to basic information; follows simple commands, but often requires cuing for sequencing and overall task initiation.  Endorses generalized pain in low back, R hip (FACES 6/10); improved with repositioning in bed (meds received prior to session).  Bilat UE/LE strength and ROM grossly symmetrical and WFL; no focal weakness, sensory deficit appreciated.  Able to complete bed mobility with min assist; sit/stand, basic transfers and gait (16') with RW, cga/min assist.  Demonstrates mild forward trunk flexion, fair step height/length; min cuing for safety and overall recall of task. Distance limited by generalized fatigue. Would benefit from skilled PT to address above deficits and promote optimal return to PLOF.; Recommend transition to HHPT upon discharge from acute hospitalization.         Recommendations for follow up therapy are one component of a multi-disciplinary discharge planning process, led by the attending physician.  Recommendations may be updated based on patient status, additional functional criteria and insurance  authorization.  Follow Up Recommendations Home health PT      Assistance Recommended at Discharge Intermittent Supervision/Assistance  Patient can return home with the following  A little help with walking and/or transfers;A little help with bathing/dressing/bathroom;Assistance with cooking/housework;Assist for transportation;Help with stairs or ramp for entrance    Equipment Recommendations Rolling walker (2 wheels);BSC/3in1  Recommendations for Other Services       Functional Status Assessment Patient has had a recent decline in their functional status and demonstrates the ability to make significant improvements in function in a reasonable and predictable amount of time.     Precautions / Restrictions Precautions Precautions: Fall;Back Restrictions Weight Bearing Restrictions: No      Mobility  Bed Mobility Overal bed mobility: Needs Assistance Bed Mobility: Supine to Sit Rolling: Min assist   Supine to sit: Min assist          Transfers Overall transfer level: Needs assistance Equipment used: Rolling walker (2 wheels) Transfers: Sit to/from Stand Sit to Stand: Min guard, Min assist           General transfer comment: cuing for hand placement to prevent pulling on RW; limited carry-over between trials    Ambulation/Gait Ambulation/Gait assistance: Min guard, Min assist Gait Distance (Feet): 20 Feet Assistive device: Rolling walker (2 wheels)         General Gait Details: mild forward trunk flexion, fair step height/length; min cuing for safety and overall recall of task.  Distance limited by generalized fatigue  Stairs            Wheelchair Mobility    Modified Rankin (Stroke Patients Only)       Balance Overall balance assessment: Needs assistance Sitting-balance support: No upper extremity supported, Feet supported Sitting balance-Leahy Scale: Good     Standing balance support: Bilateral upper extremity supported, During functional  activity, Reliant on assistive device for balance Standing balance-Leahy Scale: Fair                               Pertinent Vitals/Pain Pain Assessment Pain Assessment: Faces Faces Pain Scale: Hurts even more Pain Location: low back, R hip Pain Descriptors / Indicators: Discomfort, Grimacing, Guarding Pain Intervention(s): Limited activity within patient's tolerance, Monitored during session, Repositioned    Home Living Family/patient expects to be discharged to:: Private residence Living Arrangements: Children Available Help at Discharge: Family;Available 24 hours/day Type of Home: House Home Access: Stairs to enter Entrance Stairs-Rails: Right Entrance Stairs-Number of Steps: 2   Home Layout: One level Home Equipment: None      Prior Function Prior Level of Function : Independent/Modified Independent;Working/employed;Driving             Mobility Comments: Pt was independent without AD, working, did require assistance to lift heavy items & rest breaks PRN. Reports 1 fall in the past 6 months.       Hand Dominance        Extremity/Trunk Assessment   Upper Extremity Assessment Upper Extremity Assessment: Overall WFL for tasks assessed    Lower Extremity Assessment Lower Extremity Assessment: Overall WFL for tasks assessed (grossly 4/5 throughout; no focal weakness, sensory or coordination deficit appreciated)       Communication   Communication: No difficulties  Cognition Arousal/Alertness: Awake/alert Behavior During Therapy: Flat affect Overall Cognitive Status: Impaired/Different from baseline                                 General Comments: Mildly impulsive with frequent vcs required throughout for safety, pt with good carry over following cueing; min cuing for task initiation and sequencing. Frequently closing eyes/sleeping if not constantly awakened        General Comments      Exercises     Assessment/Plan    PT  Assessment Patient needs continued PT services  PT Problem List Decreased strength;Pain;Decreased cognition;Decreased activity tolerance;Decreased balance;Decreased mobility;Decreased knowledge of precautions;Decreased safety awareness;Decreased knowledge of use of DME       PT Treatment Interventions DME instruction;Therapeutic exercise;Gait training;Balance training;Neuromuscular re-education;Stair training;Functional mobility training;Cognitive remediation;Therapeutic activities;Patient/family education;Manual techniques;Modalities    PT Goals (Current goals can be found in the Care Plan section)  Acute Rehab PT Goals Patient Stated Goal: have surgery PT Goal Formulation: With patient/family Time For Goal Achievement: 02/27/22 Potential to Achieve Goals: Good    Frequency 7X/week     Co-evaluation               AM-PAC PT "6 Clicks" Mobility  Outcome Measure Help needed turning from your back to your side while in a flat bed without using bedrails?: A Little Help needed moving from lying on your back to sitting on the side of a flat bed without using bedrails?: A Little Help needed moving to and from a bed to a chair (including a wheelchair)?: A Little Help needed standing up from a chair using your arms (e.g., wheelchair or bedside chair)?: A Little Help needed to walk in hospital room?: A Little Help needed climbing 3-5 steps with a railing? : A Lot 6 Click Score: 17    End of Session Equipment Utilized During Treatment: Gait belt Activity Tolerance: Patient tolerated treatment well;Patient limited by pain;Patient limited by fatigue Patient left: in bed;with call bell/phone  within reach;with family/visitor present;with bed alarm set Nurse Communication: Mobility status PT Visit Diagnosis: Unsteadiness on feet (R26.81);Muscle weakness (generalized) (M62.81);Pain    Time: 0940-1000 PT Time Calculation (min) (ACUTE ONLY): 20 min   Charges:   PT Evaluation $PT  Re-evaluation: 1 Re-eval         Ahmad Vanwey H. Owens Shark, PT, DPT, NCS 02/16/22, 10:09 AM [phone]

## 2022-02-16 NOTE — Progress Notes (Signed)
Nutrition Follow-up  DOCUMENTATION CODES:   Not applicable  INTERVENTION:   -Continue liberalized diet of regular -Continue MVI with minerals daily -Continue Glucerna Shake po TID, each supplement provides 220 kcal and 10 grams of protein   NUTRITION DIAGNOSIS:   Inadequate oral intake related to decreased appetite as evidenced by per patient/family report.  Progressing   GOAL:   Patient will meet greater than or equal to 90% of their needs  Progressing   MONITOR:   PO intake, Supplement acceptance  REASON FOR ASSESSMENT:   Malnutrition Screening Tool    ASSESSMENT:   Pt with history of hypertension, hyperlipidemia, non-insulin-dependent diabetes mellitus, depression, anxiety, who presents for chief concerns of low back pain not relieved with home pain medications.  11/6- s/p  Open reduction internal fixation L1 fracture   Reviewed I/O's: +1.3 L x 24 hours and +2.8 L since admission  UOP: 600 ml x 24 hours   Pt unavailable at time of visit. Attempted to speak with pt via call to hospital room phone, however, unable to reach.   Pt with improved oral intake, consuming 100% of meals. She is also taking Glucerna supplements. Agree with liberalized diet of regular for wider variety of meal selections.   Medications reviewed and include vitamin C, senokot, and miralax.   Labs reviewed: CBGS: 104-198 (inpatient orders for glycemic control are 0-15 units insulin aspart TID with meals, 0-5 units insulin aspart daily at bedtime, and 500 mg metformin daily).    Diet Order:   Diet Order             Diet regular Room service appropriate? Yes; Fluid consistency: Thin  Diet effective now                   EDUCATION NEEDS:   Education needs have been addressed  Skin:  Skin Assessment: Reviewed RN Assessment  Last BM:  02/14/22  Height:   Ht Readings from Last 1 Encounters:  02/10/22 5' (1.524 m)    Weight:   Wt Readings from Last 1 Encounters:  02/10/22  59.4 kg    Ideal Body Weight:  45.5 kg  BMI:  Body mass index is 25.58 kg/m.  Estimated Nutritional Needs:   Kcal:  1600-1800  Protein:  85-100 grams  Fluid:  > 1.6 L    Loistine Chance, RD, LDN, [place] Registered Dietitian II Certified Diabetes Care and Education Specialist Please refer to [facility] for RD and/or RD on-call/weekend/after hours pager

## 2022-02-16 NOTE — Evaluation (Signed)
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  Now s/p T11-L3 posterior fusion (02/15/22). PMH significant for hypertension, hyperlipidemia, non-insulin-dependent diabetes mellitus, depression, anxiety   Clinical Impression   Re-evaluation now that pt s/p T11-L3 posterior fusion yesterday. Continues to have confusion (which is change from baseline). See below for details of session.  Patient will benefit from continued OT while in acute care.     Recommendations for follow up therapy are one component of a multi-disciplinary discharge planning process, led by the attending physician.  Recommendations may be updated based on patient status, additional functional criteria and insurance authorization.   Follow Up Recommendations  Home health OT    Assistance Recommended at Discharge Frequent or constant Supervision/Assistance  Patient can return home with the following A little help with walking and/or transfers;A lot of help with bathing/dressing/bathroom;Assistance with cooking/housework;Direct supervision/assist for medications management;Direct supervision/assist for financial management;Assist for transportation;Help with stairs or ramp for entrance    Functional Status Assessment  Patient has had a recent decline in their functional status and demonstrates the ability to make significant improvements in function in a reasonable and predictable amount of time.  Equipment Recommendations  BSC/3in1    Recommendations for Other Services       Precautions /  Restrictions Precautions Precautions: Fall;Back Required Braces or Orthoses: Spinal Brace (for comfort) Spinal Brace: Thoracolumbosacral orthotic Restrictions Weight Bearing Restrictions: No      Mobility Bed Mobility Overal bed mobility: Needs Assistance Bed Mobility: Supine to Sit     Supine to sit: Min guard (with verbal cues and rails)          Transfers                   General transfer comment: cues for hand placement on bed and then when standing on RW; pt distracted; stepped approx 3 feet to the side and back to recliner chair.      Balance                                           ADL either performed or assessed with clinical judgement   ADL Overall ADL's : Needs assistance/impaired     Grooming: Wash/dry hands;Sitting (OT provided wet wipe and verbal cue to clean hands; however, pt requiring further verbal and tactile cue to appropriately wipe hands with wipe, as she was acting as if she was going to put it on the floor.)                                 General ADL Comments: Anticipate pt will require signficant assistance for ADLs mostly due to decreased cognition at this time and decreased following of cues/precautions.     Vision         Perception     Praxis      Pertinent Vitals/Pain Pain Assessment Pain Assessment: 0-10 Pain Score: 8  Pain Location: low back Pain Descriptors / Indicators: Discomfort, Grimacing, Guarding  Pain Intervention(s): Limited activity within patient's tolerance, Monitored during session, Repositioned     Hand Dominance     Extremity/Trunk Assessment Upper Extremity Assessment Upper Extremity Assessment: Overall WFL for tasks assessed   Lower Extremity Assessment Lower Extremity Assessment: Defer to PT evaluation       Communication Communication Communication: No difficulties;Other (comment) (appearing slightly HOH or perhaps delirious; OT needing to repeat self  occasionally)   Cognition Arousal/Alertness: Awake/alert Behavior During Therapy: Flat affect, Impulsive Overall Cognitive Status: Impaired/Different from baseline Area of Impairment: Orientation, Memory, Following commands, Safety/judgement, Problem solving, Awareness                 Orientation Level: Disoriented to, Place (Pt stating that "I think the recliner is downstairs in the other room") Current Attention Level: Selective Memory: Decreased recall of precautions, Decreased short-term memory Following Commands: Follows one step commands with increased time Safety/Judgement: Decreased awareness of deficits   Problem Solving: Requires verbal cues, Requires tactile cues General Comments: Mildly impulsive; needing cues. Pt also appears to be seeing things that are not present, such as trying to pull some string with her hands that is not present; leaning over in the chair to reach for something on the floor, but nothing is on the floor.     General Comments       Exercises     Shoulder Instructions      Home Living Family/patient expects to be discharged to:: Private residence Living Arrangements: Children Available Help at Discharge: Family;Available 24 hours/day Type of Home: House Home Access: Stairs to enter [employer] of Steps: 2 Entrance Stairs-Rails: Right Home Layout: One level               Home Equipment: None          Prior Functioning/Environment Prior Level of Function : Independent/Modified Independent;Working/employed;Driving             Mobility Comments: Pt was independent without AD, working, did require assistance to lift heavy items & rest breaks PRN. Reports 1 fall in the past 6 months. ADLs Comments: Daughter reports that in the past 2 weeks pt has experienced delirium; daughter feels that it is due to pain (daughter is ortho Therapist, sports).        OT Problem List: Decreased strength;Decreased activity tolerance;Decreased  knowledge of use of DME or AE;Decreased safety awareness      OT Treatment/Interventions: Self-care/ADL training;Patient/family education;Therapeutic exercise;Balance training;Therapeutic activities;Energy conservation;DME and/or AE instruction    OT Goals(Current goals can be found in the care plan section) Acute Rehab OT Goals Patient Stated Goal: Feel better; less pain OT Goal Formulation: With patient/family Time For Goal Achievement: 02/28/22 Potential to Achieve Goals: Good  OT Frequency: Min 2X/week    Co-evaluation              AM-PAC OT "6 Clicks" Daily Activity     Outcome Measure Help from another person eating meals?: None Help from another person taking care of personal grooming?: A Little Help from another person toileting, which includes using toliet, bedpan, or urinal?: A Little Help from another person bathing (including washing, rinsing, drying)?: A Lot Help from another person to put on and taking off regular upper body clothing?: None Help from another person to put on and taking off regular lower body clothing?: A Lot 6 Click Score: 18   End of Session Equipment Utilized During Treatment: Rolling walker (2 wheels) Nurse Communication: Mobility status  Activity Tolerance: No increased pain;Other (comment) (  limited by decreased cognition) Patient left: in chair;with chair alarm set;with family/visitor present  OT Visit Diagnosis: Unsteadiness on feet (R26.81);Muscle weakness (generalized) (M62.81)                Time: 9292-4462 OT Time Calculation (min): 16 min Charges:  OT General Charges $OT Visit: 1 Visit OT Evaluation $OT Re-eval: 1 Re-eval  Waymon Amato, MS, OTR/L   Vania Rea 02/16/2022, 3:53 PM

## 2022-02-17 ENCOUNTER — Encounter: Payer: Self-pay | Admitting: *Deleted

## 2022-02-17 DIAGNOSIS — C801 Malignant (primary) neoplasm, unspecified: Secondary | ICD-10-CM

## 2022-02-17 DIAGNOSIS — M545 Low back pain, unspecified: Secondary | ICD-10-CM

## 2022-02-17 DIAGNOSIS — Z515 Encounter for palliative care: Secondary | ICD-10-CM | POA: Diagnosis not present

## 2022-02-17 DIAGNOSIS — F05 Delirium due to known physiological condition: Secondary | ICD-10-CM

## 2022-02-17 DIAGNOSIS — M8448XA Pathological fracture, other site, initial encounter for fracture: Secondary | ICD-10-CM | POA: Diagnosis not present

## 2022-02-17 DIAGNOSIS — J9859 Other diseases of mediastinum, not elsewhere classified: Secondary | ICD-10-CM | POA: Diagnosis not present

## 2022-02-17 LAB — CBC
HCT: 30.7 % — ABNORMAL LOW (ref 36.0–46.0)
Hemoglobin: 9.5 g/dL — ABNORMAL LOW (ref 12.0–15.0)
MCH: 24.1 pg — ABNORMAL LOW (ref 26.0–34.0)
MCHC: 30.9 g/dL (ref 30.0–36.0)
MCV: 77.9 fL — ABNORMAL LOW (ref 80.0–100.0)
Platelets: 270 10*3/uL (ref 150–400)
RBC: 3.94 MIL/uL (ref 3.87–5.11)
RDW: 17.2 % — ABNORMAL HIGH (ref 11.5–15.5)
WBC: 12.7 10*3/uL — ABNORMAL HIGH (ref 4.0–10.5)
nRBC: 0 % (ref 0.0–0.2)

## 2022-02-17 LAB — BASIC METABOLIC PANEL
Anion gap: 7 (ref 5–15)
BUN: 10 mg/dL (ref 8–23)
CO2: 23 mmol/L (ref 22–32)
Calcium: 9.4 mg/dL (ref 8.9–10.3)
Chloride: 104 mmol/L (ref 98–111)
Creatinine, Ser: 0.57 mg/dL (ref 0.44–1.00)
GFR, Estimated: >60 mL/min (ref >60)
Glucose, Bld: 106 mg/dL — ABNORMAL HIGH (ref 70–99)
Potassium: 4 mmol/L (ref 3.5–5.1)
Sodium: 134 mmol/L — ABNORMAL LOW (ref 135–145)

## 2022-02-17 LAB — GLUCOSE, CAPILLARY
Glucose-Capillary: 108 mg/dL — ABNORMAL HIGH (ref 70–99)
Glucose-Capillary: 131 mg/dL — ABNORMAL HIGH (ref 70–99)

## 2022-02-17 MED ORDER — ENOXAPARIN SODIUM 40 MG/0.4ML IJ SOSY
40.0000 mg | PREFILLED_SYRINGE | INTRAMUSCULAR | Status: DC
  Administered 2022-02-17: 40 mg via SUBCUTANEOUS
  Filled 2022-02-17: qty 0.4

## 2022-02-17 MED ORDER — MELATONIN 3 MG PO TABS: 3.0000 mg | ORAL_TABLET | Freq: Every evening | ORAL | Status: DC | PRN

## 2022-02-17 MED ORDER — HYDROCODONE-ACETAMINOPHEN 5-325 MG PO TABS
1.0000 | ORAL_TABLET | Freq: Two times a day (BID) | ORAL | Status: DC | PRN
  Administered 2022-02-18: 1 via ORAL
  Filled 2022-02-17: qty 1

## 2022-02-17 MED ORDER — IBUPROFEN 400 MG PO TABS
200.0000 mg | ORAL_TABLET | Freq: Four times a day (QID) | ORAL | Status: DC | PRN
  Administered 2022-02-17: 200 mg via ORAL
  Filled 2022-02-17: qty 1

## 2022-02-17 MED ORDER — CYCLOBENZAPRINE HCL 10 MG PO TABS
5.0000 mg | ORAL_TABLET | Freq: Three times a day (TID) | ORAL | Status: DC
  Administered 2022-02-17 – 2022-02-18 (×2): 5 mg via ORAL
  Filled 2022-02-17 (×2): qty 1

## 2022-02-17 MED ORDER — MELATONIN 5 MG PO TABS
5.0000 mg | ORAL_TABLET | Freq: Once | ORAL | Status: AC
  Administered 2022-02-17: 5 mg via ORAL
  Filled 2022-02-17: qty 1

## 2022-02-17 MED ORDER — ACETAMINOPHEN 325 MG PO TABS
650.0000 mg | ORAL_TABLET | Freq: Four times a day (QID) | ORAL | Status: DC | PRN
  Administered 2022-02-17: 650 mg via ORAL
  Filled 2022-02-17: qty 2

## 2022-02-17 MED ORDER — ACETAMINOPHEN 325 MG PO TABS
650.0000 mg | ORAL_TABLET | Freq: Four times a day (QID) | ORAL | Status: DC
  Administered 2022-02-17 – 2022-02-18 (×3): 650 mg via ORAL
  Filled 2022-02-17 (×3): qty 2

## 2022-02-17 MED ORDER — CALCITONIN (SALMON) 200 UNIT/ACT NA SOLN
1.0000 | Freq: Every day | NASAL | Status: DC
  Administered 2022-02-17 – 2022-02-18 (×2): 1 via NASAL
  Filled 2022-02-17: qty 3.7

## 2022-02-17 MED ORDER — MELATONIN 5 MG PO TABS
5.0000 mg | ORAL_TABLET | Freq: Every evening | ORAL | Status: DC | PRN
  Administered 2022-02-17: 5 mg via ORAL
  Filled 2022-02-17: qty 1

## 2022-02-17 MED ORDER — OLANZAPINE 5 MG PO TABS: 5.0000 mg | ORAL_TABLET | Freq: Every evening | ORAL | Status: DC | PRN

## 2022-02-17 MED ORDER — ACETAMINOPHEN 325 MG PO TABS
650.0000 mg | ORAL_TABLET | Freq: Once | ORAL | Status: AC
  Administered 2022-02-17: 650 mg via ORAL
  Filled 2022-02-17: qty 2

## 2022-02-17 NOTE — Progress Notes (Cosign Needed)
Patient is not able to walk the distance required to go the bathroom, or he/she is unable to safely negotiate stairs required to access the bathroom.  A BSC will alleviate this problem.

## 2022-02-17 NOTE — Progress Notes (Signed)
Received referral from Dr. Janese Banks to provide navigational services for patient. Currently admitted and needs assistance with ordering/collecting [employer] One liquid CDx. Spoke with [place] in the lab to help coordinate lab draw while pt currently admitted on inpatient unit. Sample collected and sent out via [employer]. Dr. Janese Banks made aware. Will continue to follow during hospital stay and provide further coordination after discharge. Nothing further needed at this time.

## 2022-02-17 NOTE — Progress Notes (Signed)
       CROSS COVER NOTE  NAME: Amber Glenn MRN: [id number] DOB : 1950-12-20 ATTENDING PHYSICIAN: Emeterio Reeve, DO    Date of Service   02/17/2022   HPI/Events of Note   Medication request received from family for Restlessness and non-opioid pain medication. Daughter believes ordered pain meds are contributing to patient's restlessness.  Interventions   Assessment/Plan:  Melatonin Tylenol    This document was prepared using Dragon voice recognition software and may include unintentional dictation errors.  [name] DNP, MBA, FNP-BC Nurse Practitioner [facility] Pager [phone]

## 2022-02-17 NOTE — Consult Note (Signed)
[place] [facility] [phone] [phone]   Name: Amber Glenn Date: 02/17/2022 MRN: [id number]  DOB: 03-29-1951  Patient Care Team: Lynnell Jude, MD as PCP - General (Family Medicine) Telford Nab, RN as Oncology Nurse Navigator    REASON FOR CONSULTATION: Amber Glenn is a 71 y.o. female with multiple medical problems including diabetes, hypertension, hyperlipidemia, depression, who presented to the emergency department on 02/10/2022 with uncontrolled back pain.  MRI of the lumbar spine revealed an acute pathologic compression fracture of L1 and patient ultimately required percutaneous fixation by neurosurgery.  CT of the chest, abdomen, and pelvis revealed a centrally necrotic anterior mediastinal mass with phrenic nerve impingement and diaphragmatic dysfunction, scattered bilateral lung opacities, hypoenhancement in the inferior pancreatic head/body.  Patient underwent lymph node biopsy positive for adenocarcinoma of the lung.  Patient's hospitalization has been complicated by pain and delirium.  Palliative care was consulted to help address goals and manage ongoing symptoms.  SOCIAL HISTORY:     reports that she quit smoking about 13 years ago. She has never used smokeless tobacco. She reports that she does not drink alcohol and does not use drugs.  Patient is widowed.  She lives at home with her daughter and grandchildren.  Patient has two daughters, both of whom are nurses.  Patient retired as a Regulatory affairs officer.  ADVANCE DIRECTIVES:  Does not have  CODE STATUS: Full code  PAST MEDICAL HISTORY: Past Medical History:  Diagnosis Date   Arthritis    hands and feet   Cataract    right   Complication of anesthesia    sometimes have a hard time waking up   Diabetes mellitus    Borderline Diabetes   GERD (gastroesophageal reflux disease)    HTN (hypertension)    Hyperlipidemia    Motion sickness     Post corneal transplant    right    PAST SURGICAL HISTORY:  Past Surgical History:  Procedure Laterality Date   APPLICATION OF INTRAOPERATIVE CT SCAN  02/15/2022   Procedure: APPLICATION OF INTRAOPERATIVE CT SCAN;  Surgeon: Meade Maw, MD;  Location: [facility] ORS;  Service: Neurosurgery;;   BASAL CELL CARCINOMA EXCISION     Dr. Manley Mason   CARPAL TUNNEL RELEASE     right   COLONOSCOPY WITH PROPOFOL N/A 12/03/2015   Procedure: COLONOSCOPY WITH PROPOFOL;  Surgeon: Manya Silvas, MD;  Location: [facility] ENDOSCOPY;  Service: Endoscopy;  Laterality: N/A;   COLONOSCOPY WITH PROPOFOL N/A 02/22/2020   Procedure: COLONOSCOPY WITH PROPOFOL;  Surgeon: Lesly Rubenstein, MD;  Location: [facility] ENDOSCOPY;  Service: Endoscopy;  Laterality: N/A;   ESOPHAGOGASTRODUODENOSCOPY (EGD) WITH PROPOFOL N/A 02/22/2020   Procedure: ESOPHAGOGASTRODUODENOSCOPY (EGD) WITH PROPOFOL;  Surgeon: Lesly Rubenstein, MD;  Location: [facility] ENDOSCOPY;  Service: Endoscopy;  Laterality: N/A;   HAMMER TOE SURGERY Right 05/23/2019   Procedure: HAMMER TOE CORRECTION;  Surgeon: Samara Deist, DPM;  Location: [place];  Service: Podiatry;  Laterality: Right;  Diabetic   OSTECTOMY Right 05/23/2019   Procedure: DOUBLE OSTEOTOMY RIGHT;  Surgeon: Samara Deist, DPM;  Location: [place];  Service: Podiatry;  Laterality: Right;   SKIN CANCER EXCISION      HEMATOLOGY/ONCOLOGY HISTORY:  Oncology History   No history exists.    ALLERGIES:  has No Known Allergies.  MEDICATIONS:  Current Facility-Administered Medications  Medication Dose Route Frequency Provider Last Rate Last Admin   acetaminophen (TYLENOL) tablet 650 mg  650 mg Oral  Q6H Lyrique Hakim, Kirt Boys, NP       alum & mag hydroxide-simeth (MAALOX/MYLANTA) 200-200-20 MG/5ML suspension 30 mL  30 mL Oral Q4H PRN Wynonia Musty, Stacy, PA-C   30 mL at 02/16/22 1317   ascorbic acid (VITAMIN C) tablet 1,000 mg  1,000 mg Oral Daily Wynonia Musty, Stacy, PA-C   1,000 mg at 02/17/22  0908   calcitonin (salmon) (MIACALCIN/FORTICAL) nasal spray 1 spray  1 spray Alternating Nares Daily Richarda Osmond, MD   1 spray at 02/17/22 1108   carvedilol (COREG) tablet 3.125 mg  3.125 mg Oral BID WC Luna, Stacy, PA-C   3.125 mg at 02/17/22 2878   celecoxib (CELEBREX) capsule 200 mg  200 mg Oral BID Emeterio Reeve, DO   200 mg at 02/17/22 6767   cyclobenzaprine (FLEXERIL) tablet 5 mg  5 mg Oral TID Richarda Osmond, MD       enoxaparin (LOVENOX) injection 40 mg  40 mg Subcutaneous Q24H Aubery Lapping E, RPH       feeding supplement (GLUCERNA SHAKE) (GLUCERNA SHAKE) liquid 237 mL  237 mL Oral TID BM Luna, Stacy, PA-C   237 mL at 02/17/22 0910   gabapentin (NEURONTIN) capsule 100 mg  100 mg Oral TID Geronimo Boot, PA-C   100 mg at 02/17/22 2094   HYDROcodone-acetaminophen (NORCO/VICODIN) 5-325 MG per tablet 1 tablet  1 tablet Oral BID PRN Richarda Osmond, MD       ibuprofen (ADVIL) tablet 200 mg  200 mg Oral Q6H PRN Richarda Osmond, MD   200 mg at 02/17/22 1455   influenza vaccine adjuvanted (FLUAD) injection 0.5 mL  0.5 mL Intramuscular Tomorrow-1000 Cox, Amy N, DO       lactulose (CHRONULAC) 10 GM/15ML solution 20 g  20 g Oral BID PRN Wynonia Musty, Stacy, PA-C   20 g at 02/12/22 1519   melatonin tablet 5 mg  5 mg Oral QHS PRN Dallie Piles, RPH       metFORMIN (GLUCOPHAGE) tablet 500 mg  500 mg Oral Q breakfast Wynonia Musty, Stacy, PA-C   500 mg at 02/17/22 7096   multivitamin with minerals tablet 1 tablet  1 tablet Oral Daily Geronimo Boot, PA-C   1 tablet at 02/17/22 0909   OLANZapine (ZYPREXA) tablet 5 mg  5 mg Oral QHS PRN [place] Hanlon, Kirt Boys, NP       pantoprazole (PROTONIX) EC tablet 40 mg  40 mg Oral Daily Luna, Stacy, PA-C   40 mg at 02/17/22 0909   polyethylene glycol (MIRALAX / GLYCOLAX) packet 17 g  17 g Oral Daily Luna, Stacy, PA-C   17 g at 02/17/22 0910   simvastatin (ZOCOR) tablet 40 mg  40 mg Oral q1800 Geronimo Boot, PA-C   40 mg at 02/16/22 1755    VITAL SIGNS: BP (!)  147/73 (BP Location: Left Arm)   Pulse (!) 109   Temp 97.6 F (36.4 C)   Resp 16   Ht 5' (1.524 m)   Wt 130 lb 15.3 oz (59.4 kg)   SpO2 100%   BMI 25.58 kg/m  Filed Weights   02/10/22 1120 02/10/22 2031  Weight: 131 lb (59.4 kg) 130 lb 15.3 oz (59.4 kg)    Estimated body mass index is 25.58 kg/m as calculated from the following:   Height as of this encounter: 5' (1.524 m).   Weight as of this encounter: 130 lb 15.3 oz (59.4 kg).  LABS: CBC:    Component Value Date/Time   WBC 12.7 (H)  02/17/2022 0901   HGB 9.5 (L) 02/17/2022 0901   HCT 30.7 (L) 02/17/2022 0901   PLT 270 02/17/2022 0901   MCV 77.9 (L) 02/17/2022 0901   NEUTROABS 8.3 (H) 02/10/2022 1148   LYMPHSABS 0.8 02/10/2022 1148   MONOABS 0.4 02/10/2022 1148   EOSABS 0.2 02/10/2022 1148   BASOSABS 0.0 02/10/2022 1148   Comprehensive Metabolic Panel:    Component Value Date/Time   NA 134 (L) 02/17/2022 0901   K 4.0 02/17/2022 0901   CL 104 02/17/2022 0901   CO2 23 02/17/2022 0901   BUN 10 02/17/2022 0901   CREATININE 0.57 02/17/2022 0901   GLUCOSE 106 (H) 02/17/2022 0901   CALCIUM 9.4 02/17/2022 0901   AST 27 01/11/2021 2014   ALT 18 01/11/2021 2014   ALKPHOS 68 01/11/2021 2014   BILITOT 0.5 01/11/2021 2014   PROT 7.4 01/11/2021 2014   ALBUMIN 4.1 01/11/2021 2014    RADIOGRAPHIC STUDIES: MR BRAIN W WO CONTRAST  Result Date: 02/16/2022 CLINICAL DATA:  Metastatic disease evaluation EXAM: MRI HEAD WITHOUT AND WITH CONTRAST TECHNIQUE: Multiplanar, multiecho pulse sequences of the brain and surrounding structures were obtained without and with intravenous contrast. CONTRAST:  67mL GADAVIST GADOBUTROL 1 MMOL/ML IV SOLN COMPARISON:  01/11/2021 FINDINGS: Brain: No restricted diffusion to suggest acute or subacute infarct. No abnormal parenchymal or meningeal enhancement. No acute hemorrhage, mass, mass effect, or midline shift. No hydrocephalus or extra-axial collection. 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 Scattered T2 hyperintense signal in the periventricular white matter, likely the sequela of mild-to-moderate chronic small vessel ischemic disease. Vascular: Normal arterial flow voids. Skull and upper cervical spine: Normal marrow signal. Sinuses/Orbits: Clear paranasal sinuses. Status post bilateral lens replacements. No acute finding in the orbits. Other: Trace fluid in the mastoid air cells. IMPRESSION: No acute intracranial process. No evidence of metastatic disease in the brain. Electronically Signed   By: Merilyn Baba M.D.   On: 02/16/2022 23:45   DG Lumbar Spine 2-3 Views  Result Date: 02/15/2022 CLINICAL DATA:  Fracture L1, fluoroscopic assistance for lumbar fusion EXAM: LUMBAR SPINE - 2-3 VIEW COMPARISON:  02/01/2022 FINDINGS: Fluoroscopic images show posterior surgical fusion from T11-L3 levels. Compression fracture is seen in the body of L1 vertebra. Fluoroscopic time 64 seconds. Radiation dose 43.6 mGy. IMPRESSION: Fluoroscopic assistance was provided for surgical fusion from T11-L3 levels. Electronically Signed   By: Elmer Picker M.D.   On: 02/15/2022 15:36   DG C-Arm 1-60 Min-No Report  Result Date: 02/15/2022 Fluoroscopy was utilized by the requesting physician.  No radiographic interpretation.   DG C-Arm 1-60 Min-No Report  Result Date: 02/15/2022 Fluoroscopy was utilized by the requesting physician.  No radiographic interpretation.   [place] CORE BIOPSY (LYMPH NODES)  Result Date: 02/11/2022 INDICATION: Mediastinal mass, enlarged left supraclavicular lymph node, L1 vertebral body lesion and possible subtle pancreatic mass. EXAM: ULTRASOUND GUIDED CORE BIOPSY OF LEFT SUPRACLAVICULAR LYMPH NODE MEDICATIONS: None. ANESTHESIA/SEDATION: None. The patient was given 25 mcg of IV fentanyl during the procedure. Formal moderate conscious sedation could not be given due to lack of NPO status. PROCEDURE: The procedure, risks, benefits, and alternatives were explained to the  patient. Questions regarding the procedure were encouraged and answered. The patient understands and consents to the procedure. A time-out was performed prior to initiating the procedure. Ultrasound was used to localize a left supraclavicular lymph node to correlate with CT findings. The left neck was prepped with chlorhexidine in a sterile fashion, and a sterile drape was  applied covering the operative field. A sterile gown and sterile gloves were used for the procedure. Local anesthesia was provided with 1% Lidocaine. Under ultrasound guidance, 18 gauge core biopsy samples were obtained of a left supraclavicular lymph node. Material was submitted in formalin. Additional ultrasound was performed. COMPLICATIONS: None immediate. FINDINGS: Hypoechoic, abnormal appearing lymph node is identified just lateral to the left internal jugular vein measuring approximately 1.2 x 1.0 x 1.2 cm. Solid core biopsy samples were obtained. IMPRESSION: Ultrasound-guided core biopsy performed of a 1.2 cm left supraclavicular lymph node. Electronically Signed   By: Aletta Edouard M.D.   On: 02/11/2022 16:15   CT T-SPINE NO CHARGE  Result Date: 02/10/2022 CLINICAL DATA:  Pathologic fracture at L1 EXAM: CT THORACIC AND LUMBAR SPINE WITHOUT CONTRAST TECHNIQUE: Multidetector CT imaging of the thoracic and lumbar spine was performed without contrast. Multiplanar CT image reconstructions were also generated. RADIATION DOSE REDUCTION: This exam was performed according to the departmental dose-optimization program which includes automated exposure control, adjustment of the mA and/or kV according to patient size and/or use of iterative reconstruction technique. COMPARISON:  MRI lumbar spine 02/11/2012 FINDINGS: CT THORACIC SPINE FINDINGS Alignment: No vertebral subluxation is observed. Thoracic kyphosis noted. Vertebrae: No thoracic spine fracture or acute bony finding. There is multilevel bridging spurring anterior to the thoracic spine  at the T2 through T10 levels. Paraspinal and other soft tissues: Please see dedicated CT chest report. Disc levels: No substantial thoracic spine impingement is noted. There is left paracentral posterior intervertebral spurring at the T8-9 level but not causing central narrowing of the thecal sac. CT LUMBAR SPINE FINDINGS Segmentation: The lowest lumbar type non-rib-bearing vertebra is labeled as L5. Alignment: 5 mm of grade 1 degenerative anterolisthesis at L4-5. Vertebrae: Pathologic fracture at L1 with about 65% loss of vertebral body height, with the posterior contour of the vertebral body bulging back 6 mm in a manner highly suspicious for pathologic underlying tumor. The abnormal associated lucency in the vertebral body extends into both pedicles and into the transverse processes (right greater than left, with expansion of the pedicles and right transverse process. There is some mild extension into the right lamina on image 25 series 4. Mild degenerative endplate sclerosis at the L3-4 and L4-5 levels with loss of intervertebral disc height. No other lytic lesions are identified in the lumbar spine. Paraspinal and other soft tissues: Please see dedicated CT abdomen report. Disc levels: T12-L1: Borderline bilateral foraminal stenosis due to tumor expansion of the bilateral pedicles. Cannot exclude right subarticular lateral recess stenosis given the tumor expansion in this vicinity borderline central narrowing of the thecal sac due to posterior bony retropulsion/bulging. L1-2: No impingement. L2-3: No impingement.  Diffuse disc bulge. L3-4: No impingement.  Right eccentric disc bulge. L4-5: Moderate to prominent right and mild left foraminal stenosis and borderline central narrowing of the thecal sac due to subluxation, disc uncovering, and facet arthropathy. L5-S1: No impingement.  Central disc protrusion. IMPRESSION: 1. L1 pathologic fracture with 65% loss of vertebral body height and posterior bulging of the  vertebral body by 0.6 cm. Tumor involvement of the bilateral pedicles, right greater than left transverse process, and right lamina. Bony expansion at this level contributes to borderline impingement at T12-L1. No other osseous metastatic disease in the thoracolumbar spine identified. 2. Moderate to prominent impingement at L4-5 due to subluxation with disc uncovering and facet arthropathy. Electronically Signed   By: Van Clines M.D.   On: 02/10/2022 17:37   CT L-SPINE NO  CHARGE  Result Date: 02/10/2022 CLINICAL DATA:  Pathologic fracture at L1 EXAM: CT THORACIC AND LUMBAR SPINE WITHOUT CONTRAST TECHNIQUE: Multidetector CT imaging of the thoracic and lumbar spine was performed without contrast. Multiplanar CT image reconstructions were also generated. RADIATION DOSE REDUCTION: This exam was performed according to the departmental dose-optimization program which includes automated exposure control, adjustment of the mA and/or kV according to patient size and/or use of iterative reconstruction technique. COMPARISON:  MRI lumbar spine 02/11/2012 FINDINGS: CT THORACIC SPINE FINDINGS Alignment: No vertebral subluxation is observed. Thoracic kyphosis noted. Vertebrae: No thoracic spine fracture or acute bony finding. There is multilevel bridging spurring anterior to the thoracic spine at the T2 through T10 levels. Paraspinal and other soft tissues: Please see dedicated CT chest report. Disc levels: No substantial thoracic spine impingement is noted. There is left paracentral posterior intervertebral spurring at the T8-9 level but not causing central narrowing of the thecal sac. CT LUMBAR SPINE FINDINGS Segmentation: The lowest lumbar type non-rib-bearing vertebra is labeled as L5. Alignment: 5 mm of grade 1 degenerative anterolisthesis at L4-5. Vertebrae: Pathologic fracture at L1 with about 65% loss of vertebral body height, with the posterior contour of the vertebral body bulging back 6 mm in a manner highly  suspicious for pathologic underlying tumor. The abnormal associated lucency in the vertebral body extends into both pedicles and into the transverse processes (right greater than left, with expansion of the pedicles and right transverse process. There is some mild extension into the right lamina on image 25 series 4. Mild degenerative endplate sclerosis at the L3-4 and L4-5 levels with loss of intervertebral disc height. No other lytic lesions are identified in the lumbar spine. Paraspinal and other soft tissues: Please see dedicated CT abdomen report. Disc levels: T12-L1: Borderline bilateral foraminal stenosis due to tumor expansion of the bilateral pedicles. Cannot exclude right subarticular lateral recess stenosis given the tumor expansion in this vicinity borderline central narrowing of the thecal sac due to posterior bony retropulsion/bulging. L1-2: No impingement. L2-3: No impingement.  Diffuse disc bulge. L3-4: No impingement.  Right eccentric disc bulge. L4-5: Moderate to prominent right and mild left foraminal stenosis and borderline central narrowing of the thecal sac due to subluxation, disc uncovering, and facet arthropathy. L5-S1: No impingement.  Central disc protrusion. IMPRESSION: 1. L1 pathologic fracture with 65% loss of vertebral body height and posterior bulging of the vertebral body by 0.6 cm. Tumor involvement of the bilateral pedicles, right greater than left transverse process, and right lamina. Bony expansion at this level contributes to borderline impingement at T12-L1. No other osseous metastatic disease in the thoracolumbar spine identified. 2. Moderate to prominent impingement at L4-5 due to subluxation with disc uncovering and facet arthropathy. Electronically Signed   By: Van Clines M.D.   On: 02/10/2022 17:37   CT CHEST ABDOMEN PELVIS W CONTRAST  Result Date: 02/10/2022 CLINICAL DATA:  Occult malignancy, staging workup.  Severe pain. * Tracking Code: BO * EXAM: CT CHEST,  ABDOMEN, AND PELVIS WITH CONTRAST TECHNIQUE: Multidetector CT imaging of the chest, abdomen and pelvis was performed following the standard protocol during bolus administration of intravenous contrast. RADIATION DOSE REDUCTION: This exam was performed according to the departmental dose-optimization program which includes automated exposure control, adjustment of the mA and/or kV according to patient size and/or use of iterative reconstruction technique. CONTRAST:  159mL OMNIPAQUE IOHEXOL 300 MG/ML  SOLN COMPARISON:  Lumbar MRI 02/10/2022 and CT abdomen pelvis 01/02/2013 FINDINGS: CT CHEST FINDINGS Cardiovascular: Coronary, aortic arch,  and branch vessel atherosclerotic vascular disease. Mediastinum/Nodes: Centrally necrotic 3.1 by 2.4 by 2.9 cm anterior mediastinal mass in the left prevascular region on image 17 series 2, with mild mass effect on the brachiocephalic vein as shown on image 16 series 2. This is in the immediate vicinity of the phrenic nerve, and the elevation of the left hemidiaphragm is new compared to 08/11/2019 chest radiograph, raising suspicion for phrenic nerve impingement leading to diaphragmatic dysfunction. Left supraclavicular node, 1.0 cm in short axis on image 9 series 2. Right eccentric subcarinal node 1.0 cm in short axis on image 28 series 2. Lungs/Pleura: Scattered scarring and/or atelectasis in the left lower lobe and lingula. This is particularly notable anteriorly in the left lower lobe. Bronchiectasis and volume loss medially in the right middle lobe for example on image 98 series 3. Scattered tree-in-bud reticulonodular opacities are present in the lungs, right greater than left, characteristic for atypical infectious bronchiolitis. In light of the pathologic adenopathy in the chest this raises questions as to which of the visualized small nodules might be malignant versus infectious. One of the largest nodules is a 12 by 7 by 11 mm (volume = 500 mm^3) nodule in the right lateral  costophrenic angle on image 107 of series 3, with clustered reticulonodular opacities just cephalad to this lesion. Peripheral atelectasis versus mild pleural thickening posteriorly in the left lower lobe. Musculoskeletal: As noted above, the left hemidiaphragm is elevated, and the left anterior mediastinal mass which runs directly in the vicinity of the left phrenic nerve is implicated as a likely cause. Degenerative arthropathy at the sternoclavicular joints. Thoracic spondylosis. CT ABDOMEN PELVIS FINDINGS Hepatobiliary: Fluid density sharply defined 1.4 by 1.0 cm cyst posteriorly in segment 6 of the liver on image 59 series 2, this previously measured 0.6 cm in diameter back in 2014. Gallbladder unremarkable. No other liver lesion identified. Pancreas: Subtle abnormal hypoenhancement inferiorly in the pancreatic body measuring about 1.2 by 0.7 by 1.3 cm on image 38 of series 5. The pancreatic head is also mildly hypoenhancing. This could be inflammatory but infiltrative pancreatic adenocarcinoma cannot be excluded. This warrants detailed workup with either pancreatic protocol CT or MRI with and without contrast. No dorsal pancreatic duct dilatation identified. Spleen: Unremarkable Adrenals/Urinary Tract: Unremarkable Stomach/Bowel: Prominent stool throughout the colon favors constipation. Sigmoid colon diverticulosis is present and there is an inflamed diverticula and inflammatory findings the junction of the descending and sigmoid colon on image 31 series 5 suggesting mild acute diverticulitis. Tumor in this vicinity with local resulting inflammation is a differential diagnostic consideration given the findings elsewhere in today's examination. Vascular/Lymphatic: Atherosclerosis is present, including aortoiliac atherosclerotic disease. There is atheromatous calcification but only mild stenosis proximally in the superior mesenteric artery, with no findings of occlusion. No pathologic adenopathy observed in the  abdomen/pelvis. Reproductive: Unremarkable Other: No supplemental non-categorized findings. Musculoskeletal: As shown on the lumbar spine MRI, there is a pathologic fracture at L1 with diffuse abnormal lysis involving the vertebral body, bilateral pedicles, and bilateral transverse processes at this level with about 65% of vertebral body height and with a posterior bulging contour of the vertebral body by about 5-6 mm. Further detail on dedicated lumbar spine CT. IMPRESSION: 1. 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 There is also mild  left supraclavicular adenopathy. 2. Scattered tree-in-bud reticulonodular opacities in the lungs, right greater than left, characteristic for atypical infectious bronchiolitis. One of the largest nodules is a 12 by 7 by 11 mm nodule in the right lateral costophrenic angle, which could be malignant or infectious. 3. Subtle abnormal hypoenhancement inferiorly in the pancreatic body measuring about 1.2 by 0.7 by 1.3 cm, along with some indistinct hypoenhancement in the pancreatic head. This could be inflammatory but infiltrative pancreatic adenocarcinoma cannot be excluded. This warrants detailed workup with either pancreatic protocol CT or MRI with and without contrast. PET-CT could also be helpful in this case. 4. As shown on lumbar spine MRI, there is a pathologic (malignant) fracture at L1 with diffuse abnormal lysis involving the vertebral body, bilateral pedicles, and bilateral transverse processes at this level with about 65% of vertebral body height and with a posterior bulging contour of the vertebral body by about 5-6 mm. Further detail on dedicated lumbar spine CT. 5. Mild acute sigmoid colon diverticulitis. Tumor  in the vicinity of the local inflammation along the sigmoid colon is not totally excluded but is a less likely differential diagnostic consideration. 6. Aortic atherosclerosis.  Coronary atherosclerosis. Aortic Atherosclerosis (ICD10-I70.0). Electronically Signed   By: Van Clines M.D.   On: 02/10/2022 17:24   MR LUMBAR SPINE WO CONTRAST  Result Date: 02/10/2022 CLINICAL DATA:  Known compression fracture at L1. worsening pain EXAM: MRI LUMBAR SPINE WITHOUT CONTRAST TECHNIQUE: Multiplanar, multisequence MR imaging of the lumbar spine was performed. No intravenous contrast was administered. COMPARISON:  None Available. FINDINGS: Segmentation:  Standard. Alignment: Trace retrolisthesis of L3 on L4. Grade 1 anterolisthesis of L4 on L5. Vertebrae: There is a acute to subacute compression deformity at the L1 vertebral body level with posterior bulging of the cortex. This results in mild spinal canal narrowing at this level. There is diffuse T1 hypointense signal abnormality in the L1 vertebral body level which involve the bilateral pedicles, as well as the bilateral transverse processes (series 9, image 11). Cortex, particularly at the right transverse process is poorly visualized. Conus medullaris and cauda equina: Conus extends to the L1-L2 level. Conus and cauda equina appear normal. 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 There is atrophy of the paraspinal musculature. No retroperitoneal lymphadenopathy. Bilateral adrenal glands are normal in appearance. Disc levels: T11-T12: No evidence of spinal canal or neural foraminal stenosis. T12-L1: Mild spinal canal stenosis. Mild bilateral neural foraminal stenosis. L1-L2: No spinal canal stenosis. Moderate right neural foraminal stenosis. No left neural foraminal stenosis. L2-L3: Circumferential disc bulge. Mild bilateral facet degenerative change. Mild spinal canal  stenosis. No neural foraminal stenosis. L3-L4: Circumferential disc bulge. Mild bilateral facet degenerative change. Moderate left neural foraminal stenosis. No right neural foraminal stenosis. Mild spinal canal narrowing. L4-L5: Grade 1 anterolisthesis. Severe bilateral facet degenerative change. Severe right neural foraminal stenosis. Mild left neural foraminal stenosis. Mild spinal canal narrowing. L5-S1: No spinal canal or neural foraminal stenosis. IMPRESSION: Acute to subacute compression deformity at the L1 vertebral body level with posterior bulging of the cortex resulting in mild spinal canal stenosis at this level. There is diffuse T1 hypointense signal abnormality in the L1 vertebral body with involvement of the bilateral pedicles, as well as the bilateral transverse processes. This raises concerns for a pathologic fracture. Recommend further evaluation with CT of the lumbar spine and correlate with history of malignancy. Electronically Signed   By: Madison Hickman  Shearon Stalls M.D.   On: 02/10/2022 15:57    PERFORMANCE STATUS (ECOG) : 2 - Symptomatic, <50% confined to bed  Review of Systems Unable to complete  Physical Exam General: NAD Pulmonary: Unlabored Extremities: no edema, no joint deformities Skin: no rashes Neurological: Wakes when stimulated  IMPRESSION: Patient has had poorly controlled pain per family and nursing staff.  She has also been acutely delirious and family report that patient had little sleep last night and has been sleeping most of today.  She has been confused when awake with visual hallucinations.  Family are concerned that pain has been poorly controlled.  Both daughters are nurses and recognize the delirium may be attributed to pain medications and would like to limit opioids if possible and yet are striving to improve pain control.  Patient has had a wide variety of pain medications over the past 48 hours.  She is currently on regimen of scheduled Celebrex, gabapentin, and  calcitonin.  Additionally she has as needed Norco ordered.  We will plan to add scheduled acetaminophen.  Plan is for future XRT, which hopefully will significantly improve her overall pain.  She will require ongoing symptomatic support until she can receive XRT.  I did discuss with Dr. Izora Ribas the possibility of RFA but will not pursue this at present given her recent surgery.  Regarding delirium, patient is awake at night and sleeping during the day.  Try to limit daytime napping and maximize sleep quality at night.  Will add olanzapine as needed at bedtime.  Daughter states that patient does not have advance directives and is interested in establishing those.  Will speak with patient about this when her delirium resolves.  PLAN: -Continue current scope of treatment -Agree with scheduled NSAID. Will add scheduled acetaminophen -Norco as needed -Olanzapine as needed at bedtime -Will plan outpatient follow up   Case and plan discussed with Dr. Janese Banks  Time Total: 60 minutes  Visit consisted of counseling and education dealing with the complex and emotionally intense issues of symptom management and palliative care in the setting of serious and potentially life-threatening illness.Greater than 50%  of this time was spent counseling and coordinating care related to the above assessment and plan.  Signed by: Altha Harm, PhD, NP-C

## 2022-02-17 NOTE — Plan of Care (Signed)
  Problem: Skin Integrity: Goal: Risk for impaired skin integrity will decrease Outcome: Progressing   Problem: Activity: Goal: Risk for activity intolerance will decrease Outcome: Progressing   Problem: Nutrition: Goal: Adequate nutrition will be maintained Outcome: Progressing   Problem: Pain Managment: Goal: General experience of comfort will improve Outcome: Progressing   

## 2022-02-17 NOTE — Progress Notes (Signed)
PT Cancellation Note  Patient Details Name: Amber Glenn MRN: [id number] DOB: April 27, 1950   Cancelled Treatment:     PT attempt. PT hold. Pt just fell asleep. Per family," She hasn't slept in two days. Can we let her sleep?" Will hold PT today and return tomorrow.    Willette Pa 02/17/2022, 4:47 PM

## 2022-02-17 NOTE — Progress Notes (Signed)
PROGRESS NOTE  Amber Glenn    DOB: 25-Feb-1951, 71 y.o.  TGG:[id number]    Code Status: Full Code   DOA: 02/10/2022   LOS: 6   Brief hospital course  Amber Glenn is a 71 y.o. female with a PMH significant for hypertension, hyperlipidemia, non-insulin-dependent diabetes mellitus, depression, anxiety .  They presented from home where she lives with her daughter to the ED on 02/10/2022 with low back pain x several days days.  In the ED, it was found that they had afebrile, tachycardic 110 to 130 bpm, hypotensive 157/91, SPO2 100% on RA.  Unremarkable BMP, mild anemia Hgb 11.5 and MCV 79.  Significant findings included MRI lumbar spine: Acute/subacute compression deformity of L1, mild spinal canal stenosis due to posterior bulging cortex.  Diffuse T1 hypointense abnormality in the L1 vertebral body/pedicle/TP, raising concerns for pathologic fracture.  CT lumbar spine demonstrated tumor involvement of bilateral pedicles, right greater than left TP, right lamina with bony expansion at this level contributing to borderline impingement at T12/L1.  CT chest/abdomen/pelvis demonstrated 3.1 cm centrally necrotic mediastinal mass left prevascular region, scattered reticulonodular opacities in the lungs concerning for atypical infectious bronchiolitis, larger nodule in the right lateral costophrenic angle malignant/infectious, inflammatory pancreatic changes versus infiltrative pancreatic adenocarcinoma, pathologic lumbar spine fracture as noted above, mild acute sigmoid colon diverticulitis and tumor not excluded in this area.  They were initially treated with Keflex 500 mg p.o. one-time dose, fentanyl 50 mcg one-time dose, morphine one-time dose, Dilaudid 0.5 mg IV one-time dose, ondansetron.   Patient was admitted to medicine service for further workup and management of pathological vertebral fracture as outlined in detail below. 11/02: US guided lymph node biopsy. Pain control has been difficult.   11/03: Neurosurgery planning for intervention on Monday (today is Friday). Tapering down on dilaudid.  11/04-11/05: stable.  Pain controlled.  11/06: posterior thoracic fusion.  11/07: Lymph node pathology (+)lung adenocarcinoma - Dr Janese Banks Oncology following  02/17/22 -stable, improving.   Assessment & Plan  Principal Problem:   Pathologic fracture Active Problems:   Diabetes mellitus type 2, controlled ([place])   Hyperlipidemia   GERD   Hypertension   Pyuria   Pathologic lumbar vertebral fracture   Lumbar spine instability   Neoplasm related pain   Supraclavicular adenopathy   Mediastinal mass   Goals of care, counseling/discussion   Pathologic fracture of lumbar vertebra, initial encounter  Pathologic fracture L1- S/p fusion w/ neurosurgery 11/06  Diffuse abnormal lysis involving vertebral body and bilateral pedicles Evidence for neoplastic disease mediastinal/supraclavicular area, pancreas, lungs Non-opioid analgesia preferred to avoid side effects. 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 MRI brain negative for metastatic disease Oncology Dr. Janese Banks following, appreciate your care   Hypertension Losartan 100 mg daily resumed, carvedilol 3.125 mg p.o. twice daily resumed   Hyperlipidemia Simvastatin 40 mg daily resumed   Diabetes mellitus type 2, controlled ([place])- hgb A1c 5.9 on admission without insulin so is well below goal for age.  Discontinue insulin   E Coli UTI Completed Abx course  Body mass index is 25.58 kg/m.  VTE ppx: heparin injection 5,000 Units Start: 02/16/22 2200 Place TED hose Start: 02/10/22 1809   Diet:     Diet   Diet regular Room service appropriate? Yes; Fluid consistency: Thin    Consultants: Oncology Neurosurgery  IR  Subjective 02/17/22    Pt reports nothing she is sleeping on encounter. Daughter at bedside states that pt did not sleep well overnight so requests that I let her sleep.    Objective   Vitals:   02/16/22 1543 02/16/22 2000 02/16/22 2033 02/17/22 0107  BP: (!) 125/58 (!) 155/82 (!) 124/91 (!) 158/77  Pulse: (!) 110 (!) 116 (!) 108 (!) 110  Resp: 16 16 16 16   Temp: 98 F (36.7 C) 97.9 F (36.6 C) 97.7 F (36.5 C) 97.8 F (36.6 C)  TempSrc:      SpO2: 94% 93% 95% 91%  Weight:      Height:       No intake or output data in the 24 hours ending 02/17/22 0748 Filed Weights   02/10/22 1120 02/10/22 2031  Weight: 59.4 kg 59.4 kg     Physical Exam:  General: asleep, NAD Respiratory: normal respiratory effort. Cardiovascular: quick capillary refill Nervous: Asleep Skin: dry, intact, normal temperature, normal color. No rashes, lesions or ulcers on exposed skin  Labs   I have personally reviewed the following labs and imaging studies CBC    Component Value Date/Time   WBC 12.8 (H) 02/16/2022 0340   RBC 4.01 02/16/2022 0340   HGB 9.7 (L) 02/16/2022 0340   HCT 31.5 (L) 02/16/2022 0340   PLT 295 02/16/2022 0340   MCV 78.6 (L) 02/16/2022 0340   MCH 24.2 (L) 02/16/2022 0340   MCHC 30.8 02/16/2022 0340   RDW 16.7 (H) 02/16/2022 0340   LYMPHSABS 0.8 02/10/2022 1148   MONOABS 0.4 02/10/2022 1148   EOSABS 0.2 02/10/2022 1148   BASOSABS 0.0 02/10/2022 1148      Latest Ref Rng & Units 02/16/2022    3:40 AM 02/15/2022    5:12 PM 02/15/2022    4:28 AM  BMP  Glucose 70 - 99 mg/dL 121  115  91   BUN 8 - 23 mg/dL 13  18  23    Creatinine 0.44 - 1.00 mg/dL 0.54  0.70  0.96   Sodium 135 - 145 mmol/L 137  138  134   Potassium 3.5 - 5.1 mmol/L 4.5  4.2  4.2   Chloride 98 - 111 mmol/L 108  108  103   CO2 22 - 32 mmol/L 23  24  25    Calcium 8.9 - 10.3 mg/dL 8.8  9.1  9.6     MR BRAIN W WO CONTRAST  Result Date: 02/16/2022 CLINICAL DATA:   Metastatic disease evaluation EXAM: MRI HEAD WITHOUT AND WITH CONTRAST TECHNIQUE: Multiplanar, multiecho pulse sequences of the brain and surrounding structures were obtained without and with intravenous contrast. CONTRAST:  67mL GADAVIST GADOBUTROL 1 MMOL/ML IV SOLN COMPARISON:  01/11/2021 FINDINGS: Brain: No restricted diffusion to suggest acute or subacute infarct. No abnormal parenchymal or meningeal enhancement. No acute hemorrhage, mass, mass effect, or midline shift. No hydrocephalus or extra-axial collection. 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 Scattered T2 hyperintense signal in the periventricular white matter, likely the sequela of mild-to-moderate chronic small vessel ischemic disease. Vascular: Normal arterial flow voids. Skull and upper cervical spine: Normal marrow signal. Sinuses/Orbits: Clear paranasal sinuses. Status post bilateral lens replacements. No acute finding in the orbits. Other: Trace fluid in the mastoid air cells. IMPRESSION: No acute intracranial process. No evidence of metastatic disease in the brain. Electronically Signed   By: Merilyn Baba M.D.   On: 02/16/2022 23:45   DG Lumbar Spine 2-3 Views  Result Date: 02/15/2022  CLINICAL DATA:  Fracture L1, fluoroscopic assistance for lumbar fusion EXAM: LUMBAR SPINE - 2-3 VIEW COMPARISON:  02/01/2022 FINDINGS: Fluoroscopic images show posterior surgical fusion from T11-L3 levels. Compression fracture is seen in the body of L1 vertebra. Fluoroscopic time 64 seconds. Radiation dose 43.6 mGy. IMPRESSION: Fluoroscopic assistance was provided for surgical fusion from T11-L3 levels. Electronically Signed   By: Elmer Picker M.D.   On: 02/15/2022 15:36   DG C-Arm 1-60 Min-No Report  Result Date: 02/15/2022 Fluoroscopy was utilized by the requesting physician.  No radiographic interpretation.   DG C-Arm 1-60 Min-No Report  Result Date: 02/15/2022 Fluoroscopy was utilized by the requesting physician.  No radiographic  interpretation.    Disposition Plan & Communication  Patient status: Inpatient  Admitted From: Home Planned disposition location: Relative's home Anticipated discharge date: 11/9 pending stable mental status, safe disposition  Family Communication: daughter and granddaughter at bedside    Author: Richarda Osmond, DO [facility] 02/17/2022, 7:48 AM   Available by Epic secure chat 7AM-7PM. If 7PM-7AM, please contact night-coverage.  [facility] contact information found on [email or web address].

## 2022-02-17 NOTE — TOC Progression Note (Signed)
Transition of Care [facility]) - Progression Note    Patient Details  Name: Amber Glenn MRN: [id number] Date of Birth: [birth year]  Transition of Care [facility]) CM/SW Contact  Laurena Slimmer, RN Phone Number: 02/17/2022, 3:37 PM  Clinical Narrative:    Spoke with patient's daughter, [name] by phone at patient's bedside. Family is agreeable to [facility]. [name] would like to discuss which agency would be  best for patient with her sister. She plans to have an answer by tomorrow.   Request for 3in1 and RW made to [employer].         Expected Discharge Plan and Services                                                 Social Determinants of Health (SDOH) Interventions    Readmission Risk Interventions     No data to display

## 2022-02-17 NOTE — Plan of Care (Signed)
Patient very restless overnight. Oriented x2. Patient's daughter requested tylenol instead of the scheduled norco. Bed alarm on.        Problem: Education: Goal: Ability to describe self-care measures that may prevent or decrease complications (Diabetes Survival Skills Education) will improve Outcome: Not Progressing Goal: Individualized Educational Video(s) Outcome: Not Progressing   Problem: Coping: Goal: Ability to adjust to condition or change in health will improve Outcome: Not Progressing   Problem: Fluid Volume: Goal: Ability to maintain a balanced intake and output will improve Outcome: Not Progressing   Problem: Health Behavior/Discharge Planning: Goal: Ability to identify and utilize available resources and services will improve Outcome: Not Progressing Goal: Ability to manage health-related needs will improve Outcome: Not Progressing   Problem: Metabolic: Goal: Ability to maintain appropriate glucose levels will improve Outcome: Not Progressing   Problem: Nutritional: Goal: Maintenance of adequate nutrition will improve Outcome: Not Progressing Goal: Progress toward achieving an optimal weight will improve Outcome: Not Progressing   Problem: Skin Integrity: Goal: Risk for impaired skin integrity will decrease Outcome: Not Progressing   Problem: Tissue Perfusion: Goal: Adequacy of tissue perfusion will improve Outcome: Not Progressing   Problem: Education: Goal: Knowledge of General Education information will improve Description: Including pain rating scale, medication(s)/side effects and non-pharmacologic comfort measures Outcome: Not Progressing   Problem: Health Behavior/Discharge Planning: Goal: Ability to manage health-related needs will improve Outcome: Not Progressing   Problem: Clinical Measurements: Goal: Ability to maintain clinical measurements within normal limits will improve Outcome: Not Progressing Goal: Will remain free from  infection Outcome: Not Progressing Goal: Diagnostic test results will improve Outcome: Not Progressing Goal: Respiratory complications will improve Outcome: Not Progressing Goal: Cardiovascular complication will be avoided Outcome: Not Progressing   Problem: Activity: Goal: Risk for activity intolerance will decrease Outcome: Not Progressing   Problem: Nutrition: Goal: Adequate nutrition will be maintained Outcome: Not Progressing   Problem: Coping: Goal: Level of anxiety will decrease Outcome: Not Progressing   Problem: Elimination: Goal: Will not experience complications related to bowel motility Outcome: Not Progressing Goal: Will not experience complications related to urinary retention Outcome: Not Progressing   Problem: Pain Managment: Goal: General experience of comfort will improve Outcome: Not Progressing   Problem: Safety: Goal: Ability to remain free from injury will improve Outcome: Not Progressing   Problem: Skin Integrity: Goal: Risk for impaired skin integrity will decrease Outcome: Not Progressing

## 2022-02-17 NOTE — Progress Notes (Signed)
    Attending Progress Note  History: Amber Glenn is here for pathologic L1 fracture.  POD2: Had some confusion overnight POD1: Doing well.  Expected pain  Physical Exam: Vitals:   02/17/22 0107 02/17/22 0751  BP: (!) 158/77 (!) 147/73  Pulse: (!) 110 (!) 109  Resp: 16 16  Temp: 97.8 F (36.6 C) 97.6 F (36.4 C)  SpO2: 91% 100%    AA Ox3 CNI  Strength:5/5 throughout BLE  Data:  Other tests/results: see prior notes  Assessment/Plan:  Amber Glenn is here with an unstable pathologic L1 fracture.  - mobilize - pain control - DVT prophylaxis ok to restart - PTOT - Wear brace for comfort - not required now that she has had fixation    Meade Maw MD, [facility] Department of Neurosurgery

## 2022-02-17 NOTE — Progress Notes (Signed)
OT Cancellation Note  Patient Details Name: Amber Glenn MRN: [id number] DOB: June 05, 1950   Cancelled Treatment:    Reason Eval/Treat Not Completed: Fatigue/lethargy limiting ability to participate;Other (comment). Upon entering room, pt asleep in bed with family present. Family reporting pt has been up all night for the past 2 days and did not want therapist to wake pt up. Education provided for keeping consistent sleep/wake cycle and delirium precautions. Will re-attempt as able.  Doneta Public 02/17/2022, 4:15 PM

## 2022-02-18 ENCOUNTER — Ambulatory Visit: Payer: Medicare HMO | Admitting: Radiation Oncology

## 2022-02-18 DIAGNOSIS — M8448XA Pathological fracture, other site, initial encounter for fracture: Secondary | ICD-10-CM | POA: Diagnosis not present

## 2022-02-18 DIAGNOSIS — F05 Delirium due to known physiological condition: Secondary | ICD-10-CM | POA: Diagnosis not present

## 2022-02-18 DIAGNOSIS — M545 Low back pain, unspecified: Secondary | ICD-10-CM | POA: Diagnosis not present

## 2022-02-18 DIAGNOSIS — C799 Secondary malignant neoplasm of unspecified site: Secondary | ICD-10-CM

## 2022-02-18 DIAGNOSIS — J9859 Other diseases of mediastinum, not elsewhere classified: Secondary | ICD-10-CM | POA: Diagnosis not present

## 2022-02-18 LAB — BASIC METABOLIC PANEL
Anion gap: 8 (ref 5–15)
BUN: 9 mg/dL (ref 8–23)
CO2: 24 mmol/L (ref 22–32)
Calcium: 8.9 mg/dL (ref 8.9–10.3)
Chloride: 102 mmol/L (ref 98–111)
Creatinine, Ser: 0.46 mg/dL (ref 0.44–1.00)
GFR, Estimated: >60 mL/min (ref >60)
Glucose, Bld: 101 mg/dL — ABNORMAL HIGH (ref 70–99)
Potassium: 3.9 mmol/L (ref 3.5–5.1)
Sodium: 134 mmol/L — ABNORMAL LOW (ref 135–145)

## 2022-02-18 LAB — CBC
HCT: 28.1 % — ABNORMAL LOW (ref 36.0–46.0)
Hemoglobin: 8.9 g/dL — ABNORMAL LOW (ref 12.0–15.0)
MCH: 24.6 pg — ABNORMAL LOW (ref 26.0–34.0)
MCHC: 31.7 g/dL (ref 30.0–36.0)
MCV: 77.6 fL — ABNORMAL LOW (ref 80.0–100.0)
Platelets: 272 10*3/uL (ref 150–400)
RBC: 3.62 MIL/uL — ABNORMAL LOW (ref 3.87–5.11)
RDW: 17.5 % — ABNORMAL HIGH (ref 11.5–15.5)
WBC: 9.1 10*3/uL (ref 4.0–10.5)
nRBC: 0 % (ref 0.0–0.2)

## 2022-02-18 MED ORDER — MELATONIN 5 MG PO TABS: 5.0000 mg | ORAL_TABLET | Freq: Every evening | ORAL | 0 refills | Status: AC | PRN

## 2022-02-18 MED ORDER — GABAPENTIN 100 MG PO CAPS: 100.0000 mg | ORAL_CAPSULE | Freq: Three times a day (TID) | ORAL | 0 refills | Status: DC

## 2022-02-18 MED ORDER — CELECOXIB 200 MG PO CAPS: 200.0000 mg | ORAL_CAPSULE | Freq: Two times a day (BID) | ORAL | 0 refills | Status: DC

## 2022-02-18 MED ORDER — POLYETHYLENE GLYCOL 3350 17 G PO PACK: 17.0000 g | PACK | Freq: Every day | ORAL | 0 refills | Status: DC

## 2022-02-18 MED ORDER — HYDROCODONE-ACETAMINOPHEN 5-325 MG PO TABS: 1.0000 | ORAL_TABLET | Freq: Two times a day (BID) | ORAL | 0 refills | Status: DC | PRN

## 2022-02-18 NOTE — TOC Transition Note (Signed)
Transition of Care [facility]) - CM/SW Discharge Note   Patient Details  Name: Amber Glenn MRN: [id number] Date of Birth: 12-08-1950  Transition of Care [facility]) CM/SW Contact:  Laurena Slimmer, RN Phone Number: 02/18/2022, 10:20 AM   Clinical Narrative:    Spoke with daughter at bedside. They would prefer [place] for [place]. Referral sent and accepted by [place] at [facility].   Patient daughter will transport her home.  RW previously requested via [employer]. Patient refusing 3in1.   RNCM signing off     Final next level of care: [place] Barriers to Discharge: Barriers Resolved   Patient Goals and CMS Choice Patient states their goals for this hospitalization and ongoing recovery are:: To return home CMS Medicare.gov Compare Post Acute Care list provided to:: Patient Represenative (must comment) Choice offered to / list presented to : Patient  Discharge Placement                       Discharge Plan and Services                DME Arranged: [place] (Refused [facility]) DME Agency: [employer] Date DME Agency Contacted: 02/17/22 Time DME Agency Contacted: [id number] Representative spoke with at DME Agency: [place]: PT [place]: [place] Date [place]: 02/18/22 Time [place]: 36 Representative spoke with at [place]: [place]  Social Determinants of Health ([place]) Interventions     Readmission Risk Interventions     No data to display

## 2022-02-18 NOTE — Discharge Summary (Signed)
Physician Discharge Summary  Patient: Amber Glenn WCB:[id number] DOB: 28-Sep-1950   Code Status: Prior Admit date: 02/10/2022 Discharge date: 02/18/2022 Disposition: Relative's home, PT, OT, nurse aid, and RN PCP: Lynnell Jude, MD  Recommendations for Outpatient Follow-up:  Follow up with PCP within 1-2 weeks Regular hospital follow up. Medication changes as listed below. Several medications were duplicated on her home meds list so they were discontinued to simplify. Follow up with oncology Regarding adenocarcinoma evaluation/treatment Follow up with neurosurgery Regarding vertebral fracture  Discharge Diagnoses:  Principal Problem:   Pathologic fracture Active Problems:   Diabetes mellitus type 2, controlled ([place])   Hyperlipidemia   GERD   Hypertension   Pyuria   Pathologic lumbar vertebral fracture   Lumbar spine instability   Neoplasm related pain   Supraclavicular adenopathy   Mediastinal mass   Goals of care, counseling/discussion   Pathologic fracture of lumbar vertebra, initial encounter   Acute low back pain   Adenocarcinoma (HCC)   Delirium due to another medical condition   Palliative care encounter   Metastatic adenocarcinoma [facility])  Brief Hospital Course Summary: Amber Glenn is a 71 y.o. female with a PMH significant for hypertension, hyperlipidemia, non-insulin-dependent diabetes mellitus, depression, anxiety .   They presented from home where she lives with her daughter to the ED on 02/10/2022 with low back pain x several days days.   In the ED, it was found that they were afebrile, tachycardic 110 to 130 bpm, hypotensive 157/91, SPO2 100% on RA.  Unremarkable BMP, mild anemia Hgb 11.5 and MCV 79.  Significant findings included MRI lumbar spine: Acute/subacute compression deformity of L1, mild spinal canal stenosis due to posterior bulging cortex.  Diffuse T1 hypointense abnormality in the L1 vertebral body/pedicle/TP, raising concerns for pathologic  fracture.  CT lumbar spine demonstrated tumor involvement of bilateral pedicles, right greater than left TP, right lamina with bony expansion at this level contributing to borderline impingement at T12/L1.  CT chest/abdomen/pelvis demonstrated 3.1 cm centrally necrotic mediastinal mass left prevascular region, scattered reticulonodular opacities in the lungs concerning for atypical infectious bronchiolitis, larger nodule in the right lateral costophrenic angle malignant/infectious, inflammatory pancreatic changes versus infiltrative pancreatic adenocarcinoma, pathologic lumbar spine fracture as noted above, mild acute sigmoid colon diverticulitis and tumor not excluded in this area.   They were initially treated with Keflex 500 mg p.o. one-time dose, fentanyl 50 mcg one-time dose, morphine one-time dose, Dilaudid 0.5 mg IV one-time dose, ondansetron.  - Neurosurgery and IR were consulted to address the vertebral fractures. - oncology was consulted to address the incidental findings on imaging.    11/02: US guided lymph node biopsy. Pain control has been difficult.  11/06: posterior thoracic fusion.  11/07: Lymph node pathology (+)lung adenocarcinoma - Dr Janese Banks Oncology following 11/09: patient was discharged home with [facility]. Discharge was delayed due to difficulty with pain control and having sedation/delirium from pain medications. She was alert, stable and had moderately well controlled pain on day of discharge.  Additionally, she completed Abx course for uncomplicated UTI during admission.   Discharge Condition: Stable, improved Recommended discharge diet: Regular healthy diet  Consultations: IR Neurosurgery Oncology   Procedures/Studies: Vertebral fusion Lymph node biopsy   Allergies as of 02/18/2022   No Known Allergies      Medication List     STOP taking these medications    aspirin 81 MG tablet   docusate sodium 100 MG capsule Commonly known as: COLACE   furosemide 40 MG  tablet Commonly known as: LASIX   ibuprofen 200 MG tablet Commonly known as: ADVIL   meloxicam 15 MG tablet Commonly known as: MOBIC   Oxycodone HCl 10 MG Tabs   predniSONE 10 MG (21) Tbpk tablet Commonly known as: STERAPRED UNI-PAK 21 TAB   zinc sulfate 220 (50 Zn) MG capsule       TAKE these medications    acetaminophen 500 MG tablet Commonly known as: TYLENOL Take 500 mg by mouth every 6 (six) hours as needed for moderate pain, mild pain, fever or headache.   Allergy 10 MG tablet Generic drug: loratadine Take 10 mg by mouth daily.   carvedilol 3.125 MG tablet Commonly known as: COREG Take 3.125 mg by mouth 2 (two) times daily with a meal. What changed: Another medication with the same name was removed. Continue taking this medication, and follow the directions you see here.   celecoxib 200 MG capsule Commonly known as: CELEBREX Take 1 capsule (200 mg total) by mouth 2 (two) times daily.   gabapentin 100 MG capsule Commonly known as: NEURONTIN Take 1 capsule (100 mg total) by mouth 3 (three) times daily.   glucose blood test strip Commonly known as: True Metrix Blood Glucose Test Check sugar once daily Dx E11.9   guaiFENesin-dextromethorphan 100-10 MG/5ML syrup Commonly known as: ROBITUSSIN DM Take 10 mLs by mouth every 4 (four) hours as needed for cough.   HYDROcodone-acetaminophen 5-325 MG tablet Commonly known as: NORCO/VICODIN Take 1 tablet by mouth 2 (two) times daily as needed for up to 5 days for severe pain. What changed:  when to take this reasons to take this   losartan 100 MG tablet Commonly known as: COZAAR Take 100 mg by mouth daily. What changed: Another medication with the same name was removed. Continue taking this medication, and follow the directions you see here.   meclizine 25 MG tablet Commonly known as: ANTIVERT Take 1 tablet (25 mg total) by mouth 3 (three) times daily as needed for dizziness.   melatonin 5 MG Tabs Take 1  tablet (5 mg total) by mouth at bedtime as needed.   metFORMIN 500 MG tablet Commonly known as: GLUCOPHAGE TAKE 1 TABLET TWICE DAILY WITH A MEAL What changed: Another medication with the same name was removed. Continue taking this medication, and follow the directions you see here.   methocarbamol 500 MG tablet Commonly known as: ROBAXIN Take 500 mg by mouth 3 (three) times daily.   multivitamin capsule Take 1 capsule by mouth daily.   omeprazole 20 MG capsule Commonly known as: PRILOSEC Take 1 capsule (20 mg total) by mouth daily.   phenol 1.4 % Liqd Commonly known as: CHLORASEPTIC Use as directed 1 spray in the mouth or throat as needed for throat irritation / pain.   polyethylene glycol 17 g packet Commonly known as: MIRALAX / GLYCOLAX Take 17 g by mouth daily.   prednisoLONE acetate 0.12 % ophthalmic suspension Commonly known as: PRED MILD Place 1 drop into the right eye daily.   simvastatin 40 MG tablet Commonly known as: ZOCOR TAKE 1 TABLET EVERY DAY  AT  6:00 PM What changed: Another medication with the same name was removed. Continue taking this medication, and follow the directions you see here.   traMADol 50 MG tablet Commonly known as: ULTRAM Take 50 mg by mouth every 6 (six) hours as needed for severe pain or moderate pain.   True Metrix Air Glucose Meter Devi 1 each by Does not apply route  daily. Check sugar once daily Dx E11.9   TRUEplus Lancets 28G Misc Check sugar once daily Dx E11.9   vitamin C 1000 MG tablet Take 1,000 mg by mouth daily.         Subjective   Pt reports pain is improving. She denies dyspnea or chest pain. She is able to verbalize her chronic medications and understands follow up directions.  Objective  Blood pressure (!) 148/72, pulse 99, temperature 97.6 F (36.4 C), temperature source Oral, resp. rate 16, height 5' (1.524 m), weight 59.4 kg, SpO2 94 %.   General: Pt is alert, awake, not in acute distress Cardiovascular:  RRR, S1/S2 +, no rubs, no gallops Respiratory: CTA bilaterally, no wheezing, no rhonchi Abdominal: Soft, NT, ND, bowel sounds + Extremities: no edema, no cyanosis   The results of significant diagnostics from this hospitalization (including imaging, microbiology, ancillary and laboratory) are listed below for reference.   Imaging studies: MR BRAIN W WO CONTRAST  Result Date: 02/16/2022 CLINICAL DATA:  Metastatic disease evaluation EXAM: MRI HEAD WITHOUT AND WITH CONTRAST TECHNIQUE: Multiplanar, multiecho pulse sequences of the brain and surrounding structures were obtained without and with intravenous contrast. CONTRAST:  61mL GADAVIST GADOBUTROL 1 MMOL/ML IV SOLN COMPARISON:  01/11/2021 FINDINGS: Brain: No restricted diffusion to suggest acute or subacute infarct. No abnormal parenchymal or meningeal enhancement. No acute hemorrhage, mass, mass effect, or midline shift. No hydrocephalus or extra-axial collection. 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 Scattered T2 hyperintense signal in the periventricular white matter, likely the sequela of mild-to-moderate chronic small vessel ischemic disease. Vascular: Normal arterial flow voids. Skull and upper cervical spine: Normal marrow signal. Sinuses/Orbits: Clear paranasal sinuses. Status post bilateral lens replacements. No acute finding in the orbits. Other: Trace fluid in the mastoid air cells. IMPRESSION: No acute intracranial process. No evidence of metastatic disease in the brain. Electronically Signed   By: Merilyn Baba M.D.   On: 02/16/2022 23:45   DG Lumbar Spine 2-3 Views  Result Date: 02/15/2022 CLINICAL DATA:  Fracture L1, fluoroscopic assistance for lumbar fusion EXAM: LUMBAR SPINE - 2-3 VIEW COMPARISON:  02/01/2022 FINDINGS: Fluoroscopic images show posterior surgical fusion from T11-L3 levels. Compression fracture is seen in the body of L1 vertebra. Fluoroscopic time 64 seconds. Radiation dose 43.6 mGy. IMPRESSION: Fluoroscopic  assistance was provided for surgical fusion from T11-L3 levels. Electronically Signed   By: Elmer Picker M.D.   On: 02/15/2022 15:36   DG C-Arm 1-60 Min-No Report  Result Date: 02/15/2022 Fluoroscopy was utilized by the requesting physician.  No radiographic interpretation.   DG C-Arm 1-60 Min-No Report  Result Date: 02/15/2022 Fluoroscopy was utilized by the requesting physician.  No radiographic interpretation.   [place] CORE BIOPSY (LYMPH NODES)  Result Date: 02/11/2022 INDICATION: Mediastinal mass, enlarged left supraclavicular lymph node, L1 vertebral body lesion and possible subtle pancreatic mass. EXAM: ULTRASOUND GUIDED CORE BIOPSY OF LEFT SUPRACLAVICULAR LYMPH NODE MEDICATIONS: None. ANESTHESIA/SEDATION: None. The patient was given 25 mcg of IV fentanyl during the procedure. Formal moderate conscious sedation could not be given due to lack of NPO status. PROCEDURE: The procedure, risks, benefits, and alternatives were explained to the patient. Questions regarding the procedure were encouraged and answered. The patient understands and consents to the procedure. A time-out was performed prior to initiating the procedure. Ultrasound was used to localize a left supraclavicular lymph node to correlate with CT findings. The left neck was prepped with chlorhexidine in a sterile fashion, and a sterile drape was applied covering  the operative field. A sterile gown and sterile gloves were used for the procedure. Local anesthesia was provided with 1% Lidocaine. Under ultrasound guidance, 18 gauge core biopsy samples were obtained of a left supraclavicular lymph node. Material was submitted in formalin. Additional ultrasound was performed. COMPLICATIONS: None immediate. FINDINGS: Hypoechoic, abnormal appearing lymph node is identified just lateral to the left internal jugular vein measuring approximately 1.2 x 1.0 x 1.2 cm. Solid core biopsy samples were obtained. IMPRESSION: Ultrasound-guided core biopsy  performed of a 1.2 cm left supraclavicular lymph node. Electronically Signed   By: Aletta Edouard M.D.   On: 02/11/2022 16:15   CT T-SPINE NO CHARGE  Result Date: 02/10/2022 CLINICAL DATA:  Pathologic fracture at L1 EXAM: CT THORACIC AND LUMBAR SPINE WITHOUT CONTRAST TECHNIQUE: Multidetector CT imaging of the thoracic and lumbar spine was performed without contrast. Multiplanar CT image reconstructions were also generated. RADIATION DOSE REDUCTION: This exam was performed according to the departmental dose-optimization program which includes automated exposure control, adjustment of the mA and/or kV according to patient size and/or use of iterative reconstruction technique. COMPARISON:  MRI lumbar spine 02/11/2012 FINDINGS: CT THORACIC SPINE FINDINGS Alignment: No vertebral subluxation is observed. Thoracic kyphosis noted. Vertebrae: No thoracic spine fracture or acute bony finding. There is multilevel bridging spurring anterior to the thoracic spine at the T2 through T10 levels. Paraspinal and other soft tissues: Please see dedicated CT chest report. Disc levels: No substantial thoracic spine impingement is noted. There is left paracentral posterior intervertebral spurring at the T8-9 level but not causing central narrowing of the thecal sac. CT LUMBAR SPINE FINDINGS Segmentation: The lowest lumbar type non-rib-bearing vertebra is labeled as L5. Alignment: 5 mm of grade 1 degenerative anterolisthesis at L4-5. Vertebrae: Pathologic fracture at L1 with about 65% loss of vertebral body height, with the posterior contour of the vertebral body bulging back 6 mm in a manner highly suspicious for pathologic underlying tumor. The abnormal associated lucency in the vertebral body extends into both pedicles and into the transverse processes (right greater than left, with expansion of the pedicles and right transverse process. There is some mild extension into the right lamina on image 25 series 4. Mild degenerative  endplate sclerosis at the L3-4 and L4-5 levels with loss of intervertebral disc height. No other lytic lesions are identified in the lumbar spine. Paraspinal and other soft tissues: Please see dedicated CT abdomen report. Disc levels: T12-L1: Borderline bilateral foraminal stenosis due to tumor expansion of the bilateral pedicles. Cannot exclude right subarticular lateral recess stenosis given the tumor expansion in this vicinity borderline central narrowing of the thecal sac due to posterior bony retropulsion/bulging. L1-2: No impingement. L2-3: No impingement.  Diffuse disc bulge. L3-4: No impingement.  Right eccentric disc bulge. L4-5: Moderate to prominent right and mild left foraminal stenosis and borderline central narrowing of the thecal sac due to subluxation, disc uncovering, and facet arthropathy. L5-S1: No impingement.  Central disc protrusion. IMPRESSION: 1. L1 pathologic fracture with 65% loss of vertebral body height and posterior bulging of the vertebral body by 0.6 cm. Tumor involvement of the bilateral pedicles, right greater than left transverse process, and right lamina. Bony expansion at this level contributes to borderline impingement at T12-L1. No other osseous metastatic disease in the thoracolumbar spine identified. 2. Moderate to prominent impingement at L4-5 due to subluxation with disc uncovering and facet arthropathy. Electronically Signed   By: Van Clines M.D.   On: 02/10/2022 17:37   CT L-SPINE NO CHARGE  Result Date: 02/10/2022 CLINICAL DATA:  Pathologic fracture at L1 EXAM: CT THORACIC AND LUMBAR SPINE WITHOUT CONTRAST TECHNIQUE: Multidetector CT imaging of the thoracic and lumbar spine was performed without contrast. Multiplanar CT image reconstructions were also generated. RADIATION DOSE REDUCTION: This exam was performed according to the departmental dose-optimization program which includes automated exposure control, adjustment of the mA and/or kV according to patient  size and/or use of iterative reconstruction technique. COMPARISON:  MRI lumbar spine 02/11/2012 FINDINGS: CT THORACIC SPINE FINDINGS Alignment: No vertebral subluxation is observed. Thoracic kyphosis noted. Vertebrae: No thoracic spine fracture or acute bony finding. There is multilevel bridging spurring anterior to the thoracic spine at the T2 through T10 levels. Paraspinal and other soft tissues: Please see dedicated CT chest report. Disc levels: No substantial thoracic spine impingement is noted. There is left paracentral posterior intervertebral spurring at the T8-9 level but not causing central narrowing of the thecal sac. CT LUMBAR SPINE FINDINGS Segmentation: The lowest lumbar type non-rib-bearing vertebra is labeled as L5. Alignment: 5 mm of grade 1 degenerative anterolisthesis at L4-5. Vertebrae: Pathologic fracture at L1 with about 65% loss of vertebral body height, with the posterior contour of the vertebral body bulging back 6 mm in a manner highly suspicious for pathologic underlying tumor. The abnormal associated lucency in the vertebral body extends into both pedicles and into the transverse processes (right greater than left, with expansion of the pedicles and right transverse process. There is some mild extension into the right lamina on image 25 series 4. Mild degenerative endplate sclerosis at the L3-4 and L4-5 levels with loss of intervertebral disc height. No other lytic lesions are identified in the lumbar spine. Paraspinal and other soft tissues: Please see dedicated CT abdomen report. Disc levels: T12-L1: Borderline bilateral foraminal stenosis due to tumor expansion of the bilateral pedicles. Cannot exclude right subarticular lateral recess stenosis given the tumor expansion in this vicinity borderline central narrowing of the thecal sac due to posterior bony retropulsion/bulging. L1-2: No impingement. L2-3: No impingement.  Diffuse disc bulge. L3-4: No impingement.  Right eccentric disc  bulge. L4-5: Moderate to prominent right and mild left foraminal stenosis and borderline central narrowing of the thecal sac due to subluxation, disc uncovering, and facet arthropathy. L5-S1: No impingement.  Central disc protrusion. IMPRESSION: 1. L1 pathologic fracture with 65% loss of vertebral body height and posterior bulging of the vertebral body by 0.6 cm. Tumor involvement of the bilateral pedicles, right greater than left transverse process, and right lamina. Bony expansion at this level contributes to borderline impingement at T12-L1. No other osseous metastatic disease in the thoracolumbar spine identified. 2. Moderate to prominent impingement at L4-5 due to subluxation with disc uncovering and facet arthropathy. Electronically Signed   By: Van Clines M.D.   On: 02/10/2022 17:37   CT CHEST ABDOMEN PELVIS W CONTRAST  Result Date: 02/10/2022 CLINICAL DATA:  Occult malignancy, staging workup.  Severe pain. * Tracking Code: BO * EXAM: CT CHEST, ABDOMEN, AND PELVIS WITH CONTRAST TECHNIQUE: Multidetector CT imaging of the chest, abdomen and pelvis was performed following the standard protocol during bolus administration of intravenous contrast. RADIATION DOSE REDUCTION: This exam was performed according to the departmental dose-optimization program which includes automated exposure control, adjustment of the mA and/or kV according to patient size and/or use of iterative reconstruction technique. CONTRAST:  169mL OMNIPAQUE IOHEXOL 300 MG/ML  SOLN COMPARISON:  Lumbar MRI 02/10/2022 and CT abdomen pelvis 01/02/2013 FINDINGS: CT CHEST FINDINGS Cardiovascular: Coronary, aortic arch, and branch  vessel atherosclerotic vascular disease. Mediastinum/Nodes: Centrally necrotic 3.1 by 2.4 by 2.9 cm anterior mediastinal mass in the left prevascular region on image 17 series 2, with mild mass effect on the brachiocephalic vein as shown on image 16 series 2. This is in the immediate vicinity of the phrenic nerve,  and the elevation of the left hemidiaphragm is new compared to 08/11/2019 chest radiograph, raising suspicion for phrenic nerve impingement leading to diaphragmatic dysfunction. Left supraclavicular node, 1.0 cm in short axis on image 9 series 2. Right eccentric subcarinal node 1.0 cm in short axis on image 28 series 2. Lungs/Pleura: Scattered scarring and/or atelectasis in the left lower lobe and lingula. This is particularly notable anteriorly in the left lower lobe. Bronchiectasis and volume loss medially in the right middle lobe for example on image 98 series 3. Scattered tree-in-bud reticulonodular opacities are present in the lungs, right greater than left, characteristic for atypical infectious bronchiolitis. In light of the pathologic adenopathy in the chest this raises questions as to which of the visualized small nodules might be malignant versus infectious. One of the largest nodules is a 12 by 7 by 11 mm (volume = 500 mm^3) nodule in the right lateral costophrenic angle on image 107 of series 3, with clustered reticulonodular opacities just cephalad to this lesion. Peripheral atelectasis versus mild pleural thickening posteriorly in the left lower lobe. Musculoskeletal: As noted above, the left hemidiaphragm is elevated, and the left anterior mediastinal mass which runs directly in the vicinity of the left phrenic nerve is implicated as a likely cause. Degenerative arthropathy at the sternoclavicular joints. Thoracic spondylosis. CT ABDOMEN PELVIS FINDINGS Hepatobiliary: Fluid density sharply defined 1.4 by 1.0 cm cyst posteriorly in segment 6 of the liver on image 59 series 2, this previously measured 0.6 cm in diameter back in 2014. Gallbladder unremarkable. No other liver lesion identified. Pancreas: Subtle abnormal hypoenhancement inferiorly in the pancreatic body measuring about 1.2 by 0.7 by 1.3 cm on image 38 of series 5. The pancreatic head is also mildly hypoenhancing. This could be inflammatory  but infiltrative pancreatic adenocarcinoma cannot be excluded. This warrants detailed workup with either pancreatic protocol CT or MRI with and without contrast. No dorsal pancreatic duct dilatation identified. Spleen: Unremarkable Adrenals/Urinary Tract: Unremarkable Stomach/Bowel: Prominent stool throughout the colon favors constipation. Sigmoid colon diverticulosis is present and there is an inflamed diverticula and inflammatory findings the junction of the descending and sigmoid colon on image 31 series 5 suggesting mild acute diverticulitis. Tumor in this vicinity with local resulting inflammation is a differential diagnostic consideration given the findings elsewhere in today's examination. Vascular/Lymphatic: Atherosclerosis is present, including aortoiliac atherosclerotic disease. There is atheromatous calcification but only mild stenosis proximally in the superior mesenteric artery, with no findings of occlusion. No pathologic adenopathy observed in the abdomen/pelvis. Reproductive: Unremarkable Other: No supplemental non-categorized findings. Musculoskeletal: As shown on the lumbar spine MRI, there is a pathologic fracture at L1 with diffuse abnormal lysis involving the vertebral body, bilateral pedicles, and bilateral transverse processes at this level with about 65% of vertebral body height and with a posterior bulging contour of the vertebral body by about 5-6 mm. Further detail on dedicated lumbar spine CT. IMPRESSION: 1. 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 There is also mild left supraclavicular  adenopathy. 2. Scattered tree-in-bud reticulonodular opacities in the lungs, right greater than left, characteristic for atypical infectious  bronchiolitis. One of the largest nodules is a 12 by 7 by 11 mm nodule in the right lateral costophrenic angle, which could be malignant or infectious. 3. Subtle abnormal hypoenhancement inferiorly in the pancreatic body measuring about 1.2 by 0.7 by 1.3 cm, along with some indistinct hypoenhancement in the pancreatic head. This could be inflammatory but infiltrative pancreatic adenocarcinoma cannot be excluded. This warrants detailed workup with either pancreatic protocol CT or MRI with and without contrast. PET-CT could also be helpful in this case. 4. As shown on lumbar spine MRI, there is a pathologic (malignant) fracture at L1 with diffuse abnormal lysis involving the vertebral body, bilateral pedicles, and bilateral transverse processes at this level with about 65% of vertebral body height and with a posterior bulging contour of the vertebral body by about 5-6 mm. Further detail on dedicated lumbar spine CT. 5. Mild acute sigmoid colon diverticulitis. Tumor in the vicinity of the local inflammation along the sigmoid colon is not totally excluded but is a less likely differential diagnostic consideration. 6. Aortic atherosclerosis.  Coronary atherosclerosis. Aortic Atherosclerosis (ICD10-I70.0). Electronically Signed   By: Van Clines M.D.   On: 02/10/2022 17:24   MR LUMBAR SPINE WO CONTRAST  Result Date: 02/10/2022 CLINICAL DATA:  Known compression fracture at L1. worsening pain EXAM: MRI LUMBAR SPINE WITHOUT CONTRAST TECHNIQUE: Multiplanar, multisequence MR imaging of the lumbar spine was performed. No intravenous contrast was administered. COMPARISON:  None Available. FINDINGS: Segmentation:  Standard. Alignment: Trace retrolisthesis of L3 on L4. Grade 1 anterolisthesis of L4 on L5. Vertebrae: There is a acute to subacute compression deformity at the L1 vertebral body level with posterior bulging of the cortex. This results in mild spinal canal narrowing at this level. There is diffuse T1  hypointense signal abnormality in the L1 vertebral body level which involve the bilateral pedicles, as well as the bilateral transverse processes (series 9, image 11). Cortex, particularly at the right transverse process is poorly visualized. Conus medullaris and cauda equina: Conus extends to the L1-L2 level. Conus and cauda equina appear normal. 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 There is atrophy of the paraspinal musculature. No retroperitoneal lymphadenopathy. Bilateral adrenal glands are normal in appearance. Disc levels: T11-T12: No evidence of spinal canal or neural foraminal stenosis. T12-L1: Mild spinal canal stenosis. Mild bilateral neural foraminal stenosis. L1-L2: No spinal canal stenosis. Moderate right neural foraminal stenosis. No left neural foraminal stenosis. L2-L3: Circumferential disc bulge. Mild bilateral facet degenerative change. Mild spinal canal stenosis. No neural foraminal stenosis. L3-L4: Circumferential disc bulge. Mild bilateral facet degenerative change. Moderate left neural foraminal stenosis. No right neural foraminal stenosis. Mild spinal canal narrowing. L4-L5: Grade 1 anterolisthesis. Severe bilateral facet degenerative change. Severe right neural foraminal stenosis. Mild left neural foraminal stenosis. Mild spinal canal narrowing. L5-S1: No spinal canal or neural foraminal stenosis. IMPRESSION: Acute to subacute compression deformity at the L1 vertebral body level with posterior bulging of the cortex resulting in mild spinal canal stenosis at this level. There is diffuse T1 hypointense signal abnormality in the L1 vertebral body with involvement of the bilateral pedicles, as well as the bilateral transverse processes. This raises concerns for a pathologic fracture. Recommend further evaluation with CT of the lumbar spine and correlate with history of malignancy. Electronically  Signed   By: Madison Hickman  Shearon Stalls M.D.   On: 02/10/2022 15:57    Labs: Basic Metabolic Panel: Recent Labs  Lab 02/15/22 0428 02/15/22 1712 02/16/22 0340 02/17/22 0901 02/18/22 0346  NA 134* 138 137 134* 134*  K 4.2 4.2 4.5 4.0 3.9  CL 103 108 108 104 102  CO2 25 24 23 23 24   GLUCOSE 91 115* 121* 106* 101*  BUN 23 18 13 10 9   CREATININE 0.96 0.70 0.54 0.57 0.46  CALCIUM 9.6 9.1 8.8* 9.4 8.9   CBC: Recent Labs  Lab 02/15/22 0428 02/15/22 1712 02/16/22 0340 02/17/22 0901 02/18/22 0346  WBC 6.3 10.7* 12.8* 12.7* 9.1  HGB 9.5* 10.3* 9.7* 9.5* 8.9*  HCT 30.7* 34.3* 31.5* 30.7* 28.1*  MCV 78.5* 78.7* 78.6* 77.9* 77.6*  PLT 273 276 295 270 272   Microbiology: Results for orders placed or performed during the hospital encounter of 02/10/22  Urine Culture     Status: Abnormal   Collection Time: 02/10/22 11:48 AM   Specimen: Urine, Clean Catch  Result Value Ref Range Status   Specimen Description   Final    URINE, CLEAN CATCH Performed at [facility], [address], [place], [place] [address]    Special Requests   Final    NONE Performed at [facility], [address] [place], [place] [address]    Culture >=100,000 COLONIES/mL ESCHERICHIA COLI (A)  Final   Report Status 02/12/2022 FINAL  Final   Organism ID, Bacteria ESCHERICHIA COLI (A)  Final      Susceptibility   Escherichia coli - MIC*    AMPICILLIN >=32 RESISTANT Resistant     CEFAZOLIN <=4 SENSITIVE Sensitive     CEFEPIME <=0.12 SENSITIVE Sensitive     CEFTRIAXONE <=0.25 SENSITIVE Sensitive     CIPROFLOXACIN <=0.25 SENSITIVE Sensitive     GENTAMICIN <=1 SENSITIVE Sensitive     IMIPENEM <=0.25 SENSITIVE Sensitive     NITROFURANTOIN <=16 SENSITIVE Sensitive     TRIMETH/SULFA <=20 SENSITIVE Sensitive     AMPICILLIN/SULBACTAM 16 INTERMEDIATE Intermediate     PIP/TAZO <=4 SENSITIVE Sensitive     * >=100,000 COLONIES/mL ESCHERICHIA COLI  Surgical pcr screen     Status: None   Collection Time:  02/14/22 11:56 AM   Specimen: Nasal Mucosa; Nasal Swab  Result Value Ref Range Status   MRSA, PCR NEGATIVE NEGATIVE Final   Staphylococcus aureus NEGATIVE NEGATIVE Final    Comment: (NOTE) The Xpert SA Assay (FDA approved for NASAL specimens in patients 52 years of age and older), is one component of a comprehensive surveillance program. It is not intended to diagnose infection nor to guide or monitor treatment. Performed at [facility], [address], [place], [place] [address]     Time coordinating discharge: Over 30 minutes  Richarda Osmond, MD  [facility] 02/20/2022, 2:44 PM

## 2022-02-18 NOTE — Progress Notes (Signed)
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  Now s/p T11-L3 posterior fusion (02/15/22). PMH significant for hypertension, hyperlipidemia, non-insulin-dependent diabetes mellitus, depression, anxiety   OT comments  Pt seen for OT tx. Pt endorsing 7/10 pain but does not worsen with ADL/mobility during session. Pt completed bed mobility with supervision and PRN VC for technique, stood with RW and VC for hand placement, and ambulated to sink in room for standing grooming tasks with SBA-CGA. Pt left in bed with family present. Progressing towards goals.    Recommendations for follow up therapy are one component of a multi-disciplinary discharge planning process, led by the attending physician.  Recommendations may be updated based on patient status, additional functional criteria and insurance authorization.    Follow Up Recommendations  Home health OT    Assistance Recommended at Discharge Frequent or constant Supervision/Assistance  Patient can return home with the following  A little help with walking and/or transfers;A lot of help with bathing/dressing/bathroom;Assistance with cooking/housework;Direct supervision/assist for medications management;Direct supervision/assist for financial management;Assist for transportation;Help with stairs or ramp for entrance   Equipment Recommendations  BSC/3in1    Recommendations for Other Services      Precautions / Restrictions Precautions Precautions: Fall;Back Required Braces or Orthoses: Spinal Brace (for comfort) Spinal  Brace: Thoracolumbosacral orthotic Restrictions Weight Bearing Restrictions: No       Mobility Bed Mobility Overal bed mobility: Needs Assistance Bed Mobility: Rolling, Sidelying to Sit, Sit to Supine Rolling: Supervision Sidelying to sit: Min guard   Sit to supine: Supervision   General bed mobility comments: VC for log roll for return to bed but pt moves quickly    Transfers Overall transfer level: Needs assistance Equipment used: Rolling walker (2 wheels) Transfers: Sit to/from Stand Sit to Stand: Min guard, Supervision           General transfer comment: VC for hand placement     Balance Overall balance assessment: Needs assistance Sitting-balance support: No upper extremity supported, Feet supported Sitting balance-Leahy Scale: Good     Standing balance support: Single extremity supported, No upper extremity supported, During functional activity Standing balance-Leahy Scale: Fair Standing balance comment: fair static balance, fair- dynamic requiring BUE support on RW                           ADL either performed or assessed with clinical judgement   ADL Overall ADL's : Needs assistance/impaired     Grooming: Standing;Wash/dry face;Wash/dry hands;Oral care;Supervision/safety;Min guard                                      Extremity/Trunk Assessment              Vision       Perception     Praxis      Cognition Arousal/Alertness: Awake/alert Behavior During Therapy: Flat affect Overall Cognitive Status: Within Functional Limits for tasks assessed  General Comments: PRN VC for RW mgt        Exercises      Shoulder Instructions       General Comments      Pertinent Vitals/ Pain       Pain Assessment Pain Assessment: 0-10 Pain Score: 7  Pain Location: low back Pain Descriptors / Indicators: Discomfort, Grimacing, Guarding Pain Intervention(s): Limited  activity within patient's tolerance, Monitored during session, Premedicated before session, Repositioned  Home Living                                          Prior Functioning/Environment              Frequency  Min 2X/week        Progress Toward Goals  OT Goals(current goals can now be found in the care plan section)  Progress towards OT goals: Progressing toward goals  Acute Rehab OT Goals Patient Stated Goal: feel better; less pain OT Goal Formulation: With patient/family Time For Goal Achievement: 02/28/22 Potential to Achieve Goals: Good  Plan Discharge plan remains appropriate;Frequency remains appropriate    Co-evaluation                 AM-PAC OT "6 Clicks" Daily Activity     Outcome Measure   Help from another person eating meals?: None Help from another person taking care of personal grooming?: A Little Help from another person toileting, which includes using toliet, bedpan, or urinal?: A Little Help from another person bathing (including washing, rinsing, drying)?: A Lot Help from another person to put on and taking off regular upper body clothing?: None Help from another person to put on and taking off regular lower body clothing?: A Lot 6 Click Score: 18    End of Session Equipment Utilized During Treatment: Rolling walker (2 wheels)  OT Visit Diagnosis: Unsteadiness on feet (R26.81);Muscle weakness (generalized) (M62.81)   Activity Tolerance Patient tolerated treatment well;Patient limited by pain   Patient Left in bed;with call bell/phone within reach;with family/visitor present   Nurse Communication          Time: 9381-8299 OT Time Calculation (min): 9 min  Charges: OT General Charges $OT Visit: 1 Visit OT Treatments $Self Care/Home Management : 8-22 mins  Ardeth Perfect., MPH, MS, OTR/L ascom [phone] 02/18/22, 10:03 AM

## 2022-02-18 NOTE — Progress Notes (Signed)
    Attending Progress Note  History: Amber Glenn is here for pathologic L1 fracture.  POD3: Much clearer this AM, but still having significant pain with mobility POD2: Had some confusion overnight POD1: Doing well.  Expected pain  Physical Exam: Vitals:   02/17/22 2131 02/18/22 0600  BP: (!) 127/58 (!) 148/72  Pulse: 100 99  Resp: 20 16  Temp: 98 F (36.7 C) 97.6 F (36.4 C)  SpO2: 93% 94%    AA Ox3 CNI  Strength:5/5 throughout BLE  Data:  Other tests/results: see prior notes  Assessment/Plan:  Amber Glenn is here with an unstable pathologic L1 fracture.  - mobilize - pain control - DVT prophylaxis ok to restart - PTOT - Wear brace for comfort - not required now that she has had fixation - Remove dressings today    Meade Maw MD, [facility] Department of Neurosurgery

## 2022-02-18 NOTE — Plan of Care (Signed)
Patient sleeping between care. Calm and cooperative overnight. Last bowel movement 11/5. Plan of care reviewed with daughter and patient. Bed in lowest position. Call bell within reach.   Problem: Education: Goal: Ability to describe self-care measures that may prevent or decrease complications (Diabetes Survival Skills Education) will improve Outcome: Progressing Goal: Individualized Educational Video(s) Outcome: Progressing   Problem: Coping: Goal: Ability to adjust to condition or change in health will improve Outcome: Progressing   Problem: Fluid Volume: Goal: Ability to maintain a balanced intake and output will improve Outcome: Progressing   Problem: Health Behavior/Discharge Planning: Goal: Ability to identify and utilize available resources and services will improve Outcome: Progressing Goal: Ability to manage health-related needs will improve Outcome: Progressing   Problem: Metabolic: Goal: Ability to maintain appropriate glucose levels will improve Outcome: Progressing   Problem: Nutritional: Goal: Maintenance of adequate nutrition will improve Outcome: Progressing Goal: Progress toward achieving an optimal weight will improve Outcome: Progressing   Problem: Skin Integrity: Goal: Risk for impaired skin integrity will decrease Outcome: Progressing   Problem: Tissue Perfusion: Goal: Adequacy of tissue perfusion will improve Outcome: Progressing   Problem: Education: Goal: Knowledge of General Education information will improve Description: Including pain rating scale, medication(s)/side effects and non-pharmacologic comfort measures Outcome: Progressing   Problem: Health Behavior/Discharge Planning: Goal: Ability to manage health-related needs will improve Outcome: Progressing   Problem: Clinical Measurements: Goal: Ability to maintain clinical measurements within normal limits will improve Outcome: Progressing Goal: Will remain free from  infection Outcome: Progressing Goal: Diagnostic test results will improve Outcome: Progressing Goal: Respiratory complications will improve Outcome: Progressing Goal: Cardiovascular complication will be avoided Outcome: Progressing   Problem: Activity: Goal: Risk for activity intolerance will decrease Outcome: Progressing   Problem: Nutrition: Goal: Adequate nutrition will be maintained Outcome: Progressing   Problem: Coping: Goal: Level of anxiety will decrease Outcome: Progressing   Problem: Elimination: Goal: Will not experience complications related to bowel motility Outcome: Progressing Goal: Will not experience complications related to urinary retention Outcome: Progressing   Problem: Pain Managment: Goal: General experience of comfort will improve Outcome: Progressing   Problem: Safety: Goal: Ability to remain free from injury will improve Outcome: Progressing   Problem: Skin Integrity: Goal: Risk for impaired skin integrity will decrease Outcome: Progressing

## 2022-02-18 NOTE — Progress Notes (Signed)
Discharge packet given to patient and family. Patient and family verbalizes understanding. Equipment sent home with patient. Daughter providing transportation. Patient wheeled out by staff.

## 2022-02-18 NOTE — Consult Note (Signed)
NEW PATIENT EVALUATION  Name: Amber Glenn  MRN: [id number]  Date:   02/10/2022     DOB: 03/31/1951   This 71 y.o. female patient presents to the clinic for initial evaluation of metastatic disease to L1 status post open reduction internal fixation and patient with stage IV lung cancer.  REFERRING PHYSICIAN: No ref. provider found  CHIEF COMPLAINT:  Chief Complaint  Patient presents with   Back Pain    DIAGNOSIS: The primary encounter diagnosis was Acute low back pain, unspecified back pain laterality, unspecified whether sciatica present. Diagnoses of Pathological fracture of lumbar vertebra, initial encounter, Mediastinal mass, and Delirium due to another medical condition were also pertinent to this visit.   PREVIOUS INVESTIGATIONS:  MRI scans CT scans reviewed Clinical notes reviewed Pathology report reviewed  HPI: Patient is a 71 year old female who presented to the ER with significant back pain.  MRI scan showed L1 vertebral body involvement of bilateral pedicles and transverse process process with posterior bulging of the cortex resulting in moderate spinal canal stenosis.  She was taken to the OR and underwent open reduction internal fixation.  Patient also had CT scan of the chest showing a 3 x 2.4 x 2.9 cm anterior mediastinal mass in the left prevascular region with mass effect on the brachiocephalic vein.  She also had 1 cm left supraclavicular adenopathy.  This was biopsied and positive for metastatic carcinoma favoring pulmonary adenocarcinoma.  She is seen today in her hospital room is about to be discharged.  She is still in significant pain.  She is having no decreased motor or sensory levels in her lower extremities.  I am evaluating her for palliative radiation therapy to her spine.  PLANNED TREATMENT REGIMEN: Hypofractionated course of palliative treatment to L1  PAST MEDICAL HISTORY:  has a past medical history of Arthritis, Cataract, Complication of anesthesia,  Diabetes mellitus, GERD (gastroesophageal reflux disease), HTN (hypertension), Hyperlipidemia, Motion sickness, and Post corneal transplant.    PAST SURGICAL HISTORY:  Past Surgical History:  Procedure Laterality Date   APPLICATION OF INTRAOPERATIVE CT SCAN  02/15/2022   Procedure: APPLICATION OF INTRAOPERATIVE CT SCAN;  Surgeon: Meade Maw, MD;  Location: [facility] ORS;  Service: Neurosurgery;;   BASAL CELL CARCINOMA EXCISION     Dr. Manley Mason   CARPAL TUNNEL RELEASE     right   COLONOSCOPY WITH PROPOFOL N/A 12/03/2015   Procedure: COLONOSCOPY WITH PROPOFOL;  Surgeon: Manya Silvas, MD;  Location: [facility];  Service: Endoscopy;  Laterality: N/A;   COLONOSCOPY WITH PROPOFOL N/A 02/22/2020   Procedure: COLONOSCOPY WITH PROPOFOL;  Surgeon: Lesly Rubenstein, MD;  Location: [facility] ENDOSCOPY;  Service: Endoscopy;  Laterality: N/A;   ESOPHAGOGASTRODUODENOSCOPY (EGD) WITH PROPOFOL N/A 02/22/2020   Procedure: ESOPHAGOGASTRODUODENOSCOPY (EGD) WITH PROPOFOL;  Surgeon: Lesly Rubenstein, MD;  Location: [facility] ENDOSCOPY;  Service: Endoscopy;  Laterality: N/A;   HAMMER TOE SURGERY Right 05/23/2019   Procedure: HAMMER TOE CORRECTION;  Surgeon: Samara Deist, DPM;  Location: [place];  Service: Podiatry;  Laterality: Right;  Diabetic   OSTECTOMY Right 05/23/2019   Procedure: DOUBLE OSTEOTOMY RIGHT;  Surgeon: Samara Deist, DPM;  Location: [place];  Service: Podiatry;  Laterality: Right;   SKIN CANCER EXCISION      FAMILY HISTORY: family history includes Breast cancer (age of onset: 73) in her sister; Heart disease in her father, maternal grandfather, maternal grandmother, and mother.  SOCIAL HISTORY:  reports that she quit smoking about 13 years ago. She has never used smokeless  tobacco. She reports that she does not drink alcohol and does not use drugs.  ALLERGIES: Patient has no known allergies.  MEDICATIONS:  Current Facility-Administered Medications  Medication  Dose Route Frequency Provider Last Rate Last Admin   acetaminophen (TYLENOL) tablet 650 mg  650 mg Oral Q6H Borders, Joshua R, NP   650 mg at 02/18/22 0629   alum & mag hydroxide-simeth (MAALOX/MYLANTA) 200-200-20 MG/5ML suspension 30 mL  30 mL Oral Q4H PRN Geronimo Boot, PA-C   30 mL at 02/16/22 1317   ascorbic acid (VITAMIN C) tablet 1,000 mg  1,000 mg Oral Daily Luna, Stacy, PA-C   1,000 mg at 02/18/22 1005   calcitonin (salmon) (MIACALCIN/FORTICAL) nasal spray 1 spray  1 spray Alternating Nares Daily Richarda Osmond, MD   1 spray at 02/18/22 1007   carvedilol (COREG) tablet 3.125 mg  3.125 mg Oral BID WC Luna, Stacy, PA-C   3.125 mg at 02/18/22 1004   celecoxib (CELEBREX) capsule 200 mg  200 mg Oral BID Emeterio Reeve, DO   200 mg at 02/18/22 1005   cyclobenzaprine (FLEXERIL) tablet 5 mg  5 mg Oral TID Richarda Osmond, MD   5 mg at 02/18/22 1004   enoxaparin (LOVENOX) injection 40 mg  40 mg Subcutaneous Q24H Alison Murray, RPH   40 mg at 02/17/22 2106   feeding supplement (GLUCERNA SHAKE) (GLUCERNA SHAKE) liquid 237 mL  237 mL Oral TID BM Luna, Stacy, PA-C   237 mL at 02/18/22 1005   gabapentin (NEURONTIN) capsule 100 mg  100 mg Oral TID Geronimo Boot, PA-C   100 mg at 02/18/22 1005   HYDROcodone-acetaminophen (NORCO/VICODIN) 5-325 MG per tablet 1 tablet  1 tablet Oral BID PRN Richarda Osmond, MD   1 tablet at 02/18/22 1005   influenza vaccine adjuvanted (FLUAD) injection 0.5 mL  0.5 mL Intramuscular Tomorrow-1000 Cox, Amy N, DO       lactulose (CHRONULAC) 10 GM/15ML solution 20 g  20 g Oral BID PRN Wynonia Musty, Stacy, PA-C   20 g at 02/12/22 1519   melatonin tablet 5 mg  5 mg Oral QHS PRN Dallie Piles, RPH   5 mg at 02/17/22 2105   metFORMIN (GLUCOPHAGE) tablet 500 mg  500 mg Oral Q breakfast Wynonia Musty, Stacy, PA-C   500 mg at 02/18/22 1005   multivitamin with minerals tablet 1 tablet  1 tablet Oral Daily Geronimo Boot, PA-C   1 tablet at 02/18/22 1004   OLANZapine (ZYPREXA) tablet 5 mg  5  mg Oral QHS PRN Borders, Kirt Boys, NP       pantoprazole (PROTONIX) EC tablet 40 mg  40 mg Oral Daily Luna, Stacy, PA-C   40 mg at 02/18/22 1004   polyethylene glycol (MIRALAX / GLYCOLAX) packet 17 g  17 g Oral Daily Luna, Stacy, PA-C   17 g at 02/18/22 1004   simvastatin (ZOCOR) tablet 40 mg  40 mg Oral q1800 Luna, Stacy, PA-C   40 mg at 02/17/22 1846   Current Outpatient Medications  Medication Sig Dispense Refill   acetaminophen (TYLENOL) 500 MG tablet Take 500 mg by mouth every 6 (six) hours as needed for moderate pain, mild pain, fever or headache.     Ascorbic Acid (VITAMIN C) 1000 MG tablet Take 1,000 mg by mouth daily.       carvedilol (COREG) 3.125 MG tablet Take 3.125 mg by mouth 2 (two) times daily with a meal.     losartan (COZAAR) 100 MG tablet Take  100 mg by mouth daily.     metFORMIN (GLUCOPHAGE) 500 MG tablet TAKE 1 TABLET TWICE DAILY WITH A MEAL 180 tablet 1   methocarbamol (ROBAXIN) 500 MG tablet Take 500 mg by mouth 3 (three) times daily.     Multiple Vitamin (MULTIVITAMIN) capsule Take 1 capsule by mouth daily.       omeprazole (PRILOSEC) 20 MG capsule Take 1 capsule (20 mg total) by mouth daily. 90 capsule 3   traMADol (ULTRAM) 50 MG tablet Take 50 mg by mouth every 6 (six) hours as needed for severe pain or moderate pain.     Blood Glucose Monitoring Suppl (TRUE METRIX AIR GLUCOSE METER) DEVI 1 each by Does not apply route daily. Check sugar once daily Dx E11.9 1 Device 0   celecoxib (CELEBREX) 200 MG capsule Take 1 capsule (200 mg total) by mouth 2 (two) times daily. 60 capsule 0   gabapentin (NEURONTIN) 100 MG capsule Take 1 capsule (100 mg total) by mouth 3 (three) times daily. 270 capsule 0   glucose blood (TRUE METRIX BLOOD GLUCOSE TEST) test strip Check sugar once daily Dx E11.9 100 each 3   guaiFENesin-dextromethorphan (ROBITUSSIN DM) 100-10 MG/5ML syrup Take 10 mLs by mouth every 4 (four) hours as needed for cough. 118 mL 0   HYDROcodone-acetaminophen  (NORCO/VICODIN) 5-325 MG tablet Take 1 tablet by mouth 2 (two) times daily as needed for up to 5 days for severe pain. 10 tablet 0   loratadine (ALLERGY) 10 MG tablet Take 10 mg by mouth daily.     meclizine (ANTIVERT) 25 MG tablet Take 1 tablet (25 mg total) by mouth 3 (three) times daily as needed for dizziness. 15 tablet 0   melatonin 5 MG TABS Take 1 tablet (5 mg total) by mouth at bedtime as needed. 90 tablet 0   phenol (CHLORASEPTIC) 1.4 % LIQD Use as directed 1 spray in the mouth or throat as needed for throat irritation / pain. 236 mL 0   polyethylene glycol (MIRALAX / GLYCOLAX) 17 g packet Take 17 g by mouth daily. 14 each 0   prednisoLONE acetate (PRED MILD) 0.12 % ophthalmic suspension Place 1 drop into the right eye daily.     simvastatin (ZOCOR) 40 MG tablet TAKE 1 TABLET EVERY DAY  AT  6:00 PM 90 tablet 3   TRUEPLUS LANCETS 28G MISC Check sugar once daily Dx E11.9 100 each 3    ECOG PERFORMANCE STATUS:  2 - Symptomatic, <50% confined to bed  REVIEW OF SYSTEMS: Patient denies any weight loss, fatigue, weakness, fever, chills or night sweats. Patient denies any loss of vision, blurred vision. Patient denies any ringing  of the ears or hearing loss. No irregular heartbeat. Patient denies heart murmur or history of fainting. Patient denies any chest pain or pain radiating to her upper extremities. Patient denies any shortness of breath, difficulty breathing at night, cough or hemoptysis. Patient denies any swelling in the lower legs. Patient denies any nausea vomiting, vomiting of blood, or coffee ground material in the vomitus. Patient denies any stomach pain. Patient states has had normal bowel movements no significant constipation or diarrhea. Patient denies any dysuria, hematuria or significant nocturia. Patient denies any problems walking, swelling in the joints or loss of balance. Patient denies any skin changes, loss of hair or loss of weight. Patient denies any excessive worrying or  anxiety or significant depression. Patient denies any problems with insomnia. Patient denies excessive thirst, polyuria, polydipsia. Patient denies any swollen glands,  patient denies easy bruising or easy bleeding. Patient denies any recent infections, allergies or URI. Patient "s visual fields have not changed significantly in recent time.   PHYSICAL EXAM: BP (!) 140/62 (BP Location: Right Arm)   Pulse 95   Temp 98.2 F (36.8 C)   Resp 16   Ht 5' (1.524 m)   Wt 130 lb 15.3 oz (59.4 kg)   SpO2 93%   BMI 25.58 kg/m  Motor and sensory levels in the lower extremities are equal and symmetric.  Proprioception is intact.  Well-developed well-nourished patient in NAD. HEENT reveals PERLA, EOMI, discs not visualized.  Oral cavity is clear. No oral mucosal lesions are identified. Neck is clear without evidence of cervical or supraclavicular adenopathy. Lungs are clear to A&P. Cardiac examination is essentially unremarkable with regular rate and rhythm without murmur rub or thrill. Abdomen is benign with no organomegaly or masses noted. Motor sensory and DTR levels are equal and symmetric in the upper and lower extremities. Cranial nerves II through XII are grossly intact. Proprioception is intact. No peripheral adenopathy or edema is identified. No motor or sensory levels are noted. Crude visual fields are within normal range.  LABORATORY DATA: Pathology reports reviewed    RADIOLOGY RESULTS: MRI scans of spine as well as brain reviewed CT scans reviewed all compatible with above-stated findings   IMPRESSION: Stage IV lung cancer with involvement of L1 status post open reduction by neurosurgery in 71 year old female  PLAN: This time active ahead with palliative radiation therapy.  I will allow some few weeks more for healing from her surgery.  We will plan on starting her radiation after Thanksgiving.  We will plan on delivering 20 Gray in 5 fractions using a hybrid technique to spare small bowel as  much as possible.  Risk and benefits of treatment occluding possible diarrhea skin reaction fatigue all reviewed with the patient.  We will see her as an outpatient prior to Thanksgiving so we can start treatments right after the holiday weekend.  Patient and daughter both comprehend the recommendations well.  I would like to take this opportunity to thank you for allowing me to participate in the care of your patient.Noreene Filbert, MD

## 2022-02-18 NOTE — Plan of Care (Signed)
Problem: Education: Goal: Ability to describe self-care measures that may prevent or decrease complications (Diabetes Survival Skills Education) will improve 02/18/2022 1104 by Diquan Kassis A, LPN Outcome: Adequate for Discharge 02/18/2022 1104 by Macaria Bias A, LPN Outcome: Progressing Goal: Individualized Educational Video(s) 02/18/2022 1104 by Aleighya Mcanelly A, LPN Outcome: Adequate for Discharge 02/18/2022 1104 by Philander Ake A, LPN Outcome: Progressing   Problem: Coping: Goal: Ability to adjust to condition or change in health will improve 02/18/2022 1104 by Quamel Fitzmaurice A, LPN Outcome: Adequate for Discharge 02/18/2022 1104 by Ryin Schillo A, LPN Outcome: Progressing   Problem: Fluid Volume: Goal: Ability to maintain a balanced intake and output will improve 02/18/2022 1104 by Sakeenah Valcarcel A, LPN Outcome: Adequate for Discharge 02/18/2022 1104 by Briannia Laba A, LPN Outcome: Progressing   Problem: Health Behavior/Discharge Planning: Goal: Ability to identify and utilize available resources and services will improve 02/18/2022 1104 by Fartun Paradiso A, LPN Outcome: Adequate for Discharge 02/18/2022 1104 by Faustino Luecke A, LPN Outcome: Progressing Goal: Ability to manage health-related needs will improve 02/18/2022 1104 by Tashonna Descoteaux A, LPN Outcome: Adequate for Discharge 02/18/2022 1104 by Emmaline Wahba A, LPN Outcome: Progressing   Problem: Metabolic: Goal: Ability to maintain appropriate glucose levels will improve 02/18/2022 1104 by Nahshon Reich A, LPN Outcome: Adequate for Discharge 02/18/2022 1104 by Khyler Urda A, LPN Outcome: Progressing   Problem: Nutritional: Goal: Maintenance of adequate nutrition will improve 02/18/2022 1104 by Nalaya Wojdyla A, LPN Outcome: Adequate for Discharge 02/18/2022 1104 by Regginald Pask A, LPN Outcome: Progressing Goal: Progress toward achieving an optimal  weight will improve 02/18/2022 1104 by Temekia Caskey A, LPN Outcome: Adequate for Discharge 02/18/2022 1104 by Verba Ainley A, LPN Outcome: Progressing   Problem: Skin Integrity: Goal: Risk for impaired skin integrity will decrease 02/18/2022 1104 by Cariana Karge A, LPN Outcome: Adequate for Discharge 02/18/2022 1104 by Tawna Alwin A, LPN Outcome: Progressing   Problem: Tissue Perfusion: Goal: Adequacy of tissue perfusion will improve 02/18/2022 1104 by Jasneet Schobert A, LPN Outcome: Adequate for Discharge 02/18/2022 1104 by Mal Asher A, LPN Outcome: Progressing   Problem: Education: Goal: Knowledge of General Education information will improve Description: Including pain rating scale, medication(s)/side effects and non-pharmacologic comfort measures 02/18/2022 1104 by Seara Hinesley A, LPN Outcome: Adequate for Discharge 02/18/2022 1104 by Arial Galligan A, LPN Outcome: Progressing   Problem: Health Behavior/Discharge Planning: Goal: Ability to manage health-related needs will improve 02/18/2022 1104 by Jelani Trueba A, LPN Outcome: Adequate for Discharge 02/18/2022 1104 by Theona Muhs A, LPN Outcome: Progressing   Problem: Clinical Measurements: Goal: Ability to maintain clinical measurements within normal limits will improve 02/18/2022 1104 by Adilenne  A, LPN Outcome: Adequate for Discharge 02/18/2022 1104 by Haevyn Ury A, LPN Outcome: Progressing Goal: Will remain free from infection 02/18/2022 1104 by Lakisa Lotz A, LPN Outcome: Adequate for Discharge 02/18/2022 1104 by Tarah Buboltz A, LPN Outcome: Progressing Goal: Diagnostic test results will improve 02/18/2022 1104 by Coben Godshall A, LPN Outcome: Adequate for Discharge 02/18/2022 1104 by Hridaan Bouse A, LPN Outcome: Progressing Goal: Respiratory complications will improve 02/18/2022 1104 by Anquanette Bahner A, LPN Outcome: Adequate for  Discharge 02/18/2022 1104 by Rimsha Trembley A, LPN Outcome: Progressing Goal: Cardiovascular complication will be avoided 02/18/2022 1104 by Johnchristopher Sarvis A, LPN Outcome: Adequate for Discharge 02/18/2022 1104 by Yaron Grasse A, LPN Outcome: Progressing   Problem: Activity: Goal: Risk for activity intolerance will decrease 02/18/2022 1104 by Lu Paradise A, LPN Outcome: Adequate for Discharge  02/18/2022 1104 by Ova Gillentine A, LPN Outcome: Progressing   Problem: Nutrition: Goal: Adequate nutrition will be maintained 02/18/2022 1104 by Jassmin Kemmerer A, LPN Outcome: Adequate for Discharge 02/18/2022 1104 by Juventino Pavone A, LPN Outcome: Progressing   Problem: Coping: Goal: Level of anxiety will decrease 02/18/2022 1104 by Dajon Rowe A, LPN Outcome: Adequate for Discharge 02/18/2022 1104 by Nyanna Heideman A, LPN Outcome: Progressing   Problem: Elimination: Goal: Will not experience complications related to bowel motility 02/18/2022 1104 by Elisabetta Mishra A, LPN Outcome: Adequate for Discharge 02/18/2022 1104 by Drayson Dorko A, LPN Outcome: Progressing Goal: Will not experience complications related to urinary retention 02/18/2022 1104 by Tinia Oravec A, LPN Outcome: Adequate for Discharge 02/18/2022 1104 by Aeriel Boulay A, LPN Outcome: Progressing   Problem: Pain Managment: Goal: General experience of comfort will improve 02/18/2022 1104 by Jaiyanna Safran A, LPN Outcome: Adequate for Discharge 02/18/2022 1104 by Cayleen Benjamin A, LPN Outcome: Progressing   Problem: Safety: Goal: Ability to remain free from injury will improve 02/18/2022 1104 by Reema Chick A, LPN Outcome: Adequate for Discharge 02/18/2022 1104 by Sharry Beining A, LPN Outcome: Progressing   Problem: Skin Integrity: Goal: Risk for impaired skin integrity will decrease 02/18/2022 1104 by Rayvon Dakin A, LPN Outcome: Adequate for  Discharge 02/18/2022 1104 by Marsela Kuan A, LPN Outcome: Progressing

## 2022-02-19 ENCOUNTER — Telehealth: Payer: Self-pay

## 2022-02-19 NOTE — Telephone Encounter (Signed)
Per Meg from [facility], pt's daughter is a Marine scientist and they have declined [facility] b/c the pt is moving around and has less pain.

## 2022-02-22 ENCOUNTER — Telehealth: Payer: Self-pay

## 2022-02-22 NOTE — Telephone Encounter (Signed)
-----   Message from [place] sent at 02/22/2022  8:43 AM EST ----- Complains of trouble breathing which is not subsiding,since Saturday maybe prior.  Hemoglobin 8.9 at discharge Her o2 stats are around 90% according to her daughter.   Lacresha Fusilier (daughter) [phone]   Follow up appt scheduled with Stacy on 11/21

## 2022-02-22 NOTE — Telephone Encounter (Signed)
I spoke with Nira Conn and informed her of Dr Rhea Bleacher recommendations. She verbalized understanding. I encouraged her to contact us if they have any questions/concerns in regards to anything in relation to her surgery.

## 2022-02-24 ENCOUNTER — Encounter: Payer: Self-pay | Admitting: Hospice and Palliative Medicine

## 2022-02-24 ENCOUNTER — Encounter: Payer: Self-pay | Admitting: *Deleted

## 2022-02-24 ENCOUNTER — Inpatient Hospital Stay (HOSPITAL_BASED_OUTPATIENT_CLINIC_OR_DEPARTMENT_OTHER): Payer: Medicare HMO | Admitting: Hospice and Palliative Medicine

## 2022-02-24 ENCOUNTER — Inpatient Hospital Stay: Payer: Medicare HMO

## 2022-02-24 ENCOUNTER — Other Ambulatory Visit: Payer: Self-pay

## 2022-02-24 VITALS — BP 113/71 | HR 120 | Temp 97.5°F | Resp 18 | Ht 60.0 in | Wt 123.0 lb

## 2022-02-24 DIAGNOSIS — I10 Essential (primary) hypertension: Secondary | ICD-10-CM | POA: Insufficient documentation

## 2022-02-24 DIAGNOSIS — B370 Candidal stomatitis: Secondary | ICD-10-CM | POA: Insufficient documentation

## 2022-02-24 DIAGNOSIS — G893 Neoplasm related pain (acute) (chronic): Secondary | ICD-10-CM | POA: Diagnosis not present

## 2022-02-24 DIAGNOSIS — D649 Anemia, unspecified: Secondary | ICD-10-CM | POA: Diagnosis not present

## 2022-02-24 DIAGNOSIS — M4856XA Collapsed vertebra, not elsewhere classified, lumbar region, initial encounter for fracture: Secondary | ICD-10-CM | POA: Insufficient documentation

## 2022-02-24 DIAGNOSIS — C7951 Secondary malignant neoplasm of bone: Secondary | ICD-10-CM | POA: Insufficient documentation

## 2022-02-24 DIAGNOSIS — Z510 Encounter for antineoplastic radiation therapy: Secondary | ICD-10-CM | POA: Insufficient documentation

## 2022-02-24 DIAGNOSIS — E860 Dehydration: Secondary | ICD-10-CM

## 2022-02-24 DIAGNOSIS — J029 Acute pharyngitis, unspecified: Secondary | ICD-10-CM | POA: Insufficient documentation

## 2022-02-24 DIAGNOSIS — E1136 Type 2 diabetes mellitus with diabetic cataract: Secondary | ICD-10-CM | POA: Insufficient documentation

## 2022-02-24 DIAGNOSIS — M8448XA Pathological fracture, other site, initial encounter for fracture: Secondary | ICD-10-CM | POA: Diagnosis not present

## 2022-02-24 DIAGNOSIS — C3490 Malignant neoplasm of unspecified part of unspecified bronchus or lung: Secondary | ICD-10-CM | POA: Insufficient documentation

## 2022-02-24 DIAGNOSIS — Z803 Family history of malignant neoplasm of breast: Secondary | ICD-10-CM | POA: Insufficient documentation

## 2022-02-24 DIAGNOSIS — M549 Dorsalgia, unspecified: Secondary | ICD-10-CM | POA: Insufficient documentation

## 2022-02-24 DIAGNOSIS — Z87891 Personal history of nicotine dependence: Secondary | ICD-10-CM | POA: Insufficient documentation

## 2022-02-24 DIAGNOSIS — Z515 Encounter for palliative care: Secondary | ICD-10-CM

## 2022-02-24 DIAGNOSIS — C770 Secondary and unspecified malignant neoplasm of lymph nodes of head, face and neck: Secondary | ICD-10-CM | POA: Diagnosis not present

## 2022-02-24 LAB — COMPREHENSIVE METABOLIC PANEL
ALT: 15 U/L (ref 0–44)
AST: 20 U/L (ref 15–41)
Albumin: 3.4 g/dL — ABNORMAL LOW (ref 3.5–5.0)
Alkaline Phosphatase: 94 U/L (ref 38–126)
Anion gap: 10 (ref 5–15)
BUN: 13 mg/dL (ref 8–23)
CO2: 24 mmol/L (ref 22–32)
Calcium: 9.7 mg/dL (ref 8.9–10.3)
Chloride: 98 mmol/L (ref 98–111)
Creatinine, Ser: 0.58 mg/dL (ref 0.44–1.00)
GFR, Estimated: >60 mL/min (ref >60)
Glucose, Bld: 141 mg/dL — ABNORMAL HIGH (ref 70–99)
Potassium: 4.3 mmol/L (ref 3.5–5.1)
Sodium: 132 mmol/L — ABNORMAL LOW (ref 135–145)
Total Bilirubin: 0.4 mg/dL (ref 0.3–1.2)
Total Protein: 6.8 g/dL (ref 6.5–8.1)

## 2022-02-24 LAB — CBC WITH DIFFERENTIAL/PLATELET
Abs Immature Granulocytes: 0.12 10*3/uL — ABNORMAL HIGH (ref 0.00–0.07)
Basophils Absolute: 0.1 10*3/uL (ref 0.0–0.1)
Basophils Relative: 0 %
Eosinophils Absolute: 0.3 10*3/uL (ref 0.0–0.5)
Eosinophils Relative: 2 %
HCT: 37.5 % (ref 36.0–46.0)
Hemoglobin: 11.4 g/dL — ABNORMAL LOW (ref 12.0–15.0)
Immature Granulocytes: 1 %
Lymphocytes Relative: 10 %
Lymphs Abs: 1.4 10*3/uL (ref 0.7–4.0)
MCH: 24.3 pg — ABNORMAL LOW (ref 26.0–34.0)
MCHC: 30.4 g/dL (ref 30.0–36.0)
MCV: 80 fL (ref 80.0–100.0)
Monocytes Absolute: 1 10*3/uL (ref 0.1–1.0)
Monocytes Relative: 7 %
Neutro Abs: 10.8 10*3/uL — ABNORMAL HIGH (ref 1.7–7.7)
Neutrophils Relative %: 80 %
Platelets: 333 10*3/uL (ref 150–400)
RBC: 4.69 MIL/uL (ref 3.87–5.11)
RDW: 18.1 % — ABNORMAL HIGH (ref 11.5–15.5)
WBC: 13.7 10*3/uL — ABNORMAL HIGH (ref 4.0–10.5)
nRBC: 0 % (ref 0.0–0.2)

## 2022-02-24 LAB — SAMPLE TO BLOOD BANK

## 2022-02-24 MED ORDER — CYCLOBENZAPRINE HCL 5 MG PO TABS: 5.0000 mg | ORAL_TABLET | Freq: Three times a day (TID) | ORAL | 0 refills | Status: DC | PRN

## 2022-02-24 MED ORDER — OXYCODONE HCL 10 MG PO TABS: 10.0000 mg | ORAL_TABLET | ORAL | 0 refills | Status: DC | PRN

## 2022-02-24 MED ORDER — SODIUM CHLORIDE 0.9 % IV SOLN
INTRAVENOUS | Status: DC
  Filled 2022-02-24 (×2): qty 250

## 2022-02-24 NOTE — Progress Notes (Signed)
Met with patient after her follow up visit with Josh to introduce to navigator services and assess for any barriers. All questions answered during visit. Pt did not have any needs at this time. Contact info given and instructed to call with any questions or needs. Will follow up at next visit for CT sim on 11/20. Nothing further needed at this time.

## 2022-02-24 NOTE — Progress Notes (Signed)
[place] at [facility] [phone] Fax:([phone]   Name: Amber Glenn Date: 02/24/2022 MRN: [id number]  DOB: [birth year]  Patient Care Team: Lynnell Jude, MD as PCP - General (Family Medicine) Telford Nab, RN as Oncology Nurse Navigator    REASON FOR CONSULTATION: Amber Glenn is a 71 y.o. female with multiple medical problems including diabetes, hypertension, hyperlipidemia, depression Patient was hospitalized 02/10/2022 to 02/18/2022 with back pain.  MRI of the lumbar spine revealed an acute pathologic compression fracture of L1 and patient ultimately required percutaneous fixation by neurosurgery.  CT of the chest, abdomen, and pelvis revealed a centrally necrotic anterior mediastinal mass with phrenic nerve impingement and diaphragmatic dysfunction, scattered bilateral lung opacities, hypoenhancement in the inferior pancreatic head/body.  Patient underwent lymph node biopsy positive for adenocarcinoma of the lung.  Patient's hospitalization was complicated by pain and delirium.  Palliative care was consulted to help address goals and manage ongoing symptoms.   SOCIAL HISTORY:     reports that she quit smoking about 13 years ago. She has never used smokeless tobacco. She reports that she does not drink alcohol and does not use drugs.  Patient is widowed.  She lives at home with her daughter and grandchildren.  Patient has two daughters, both of whom are nurses.  Patient retired as a Regulatory affairs officer.   ADVANCE DIRECTIVES:  Not on file  CODE STATUS:   PAST MEDICAL HISTORY: Past Medical History:  Diagnosis Date   Arthritis    hands and feet   Cataract    right   Complication of anesthesia    sometimes have a hard time waking up   Diabetes mellitus    Borderline Diabetes   GERD (gastroesophageal reflux disease)    HTN (hypertension)    Hyperlipidemia    Motion sickness    Post corneal transplant    right     PAST SURGICAL HISTORY:  Past Surgical History:  Procedure Laterality Date   APPLICATION OF INTRAOPERATIVE CT SCAN  02/15/2022   Procedure: APPLICATION OF INTRAOPERATIVE CT SCAN;  Surgeon: Meade Maw, MD;  Location: [facility] ORS;  Service: Neurosurgery;;   BASAL CELL CARCINOMA EXCISION     Dr. Manley Mason   CARPAL TUNNEL RELEASE     right   COLONOSCOPY WITH PROPOFOL N/A 12/03/2015   Procedure: COLONOSCOPY WITH PROPOFOL;  Surgeon: Manya Silvas, MD;  Location: [facility] ENDOSCOPY;  Service: Endoscopy;  Laterality: N/A;   COLONOSCOPY WITH PROPOFOL N/A 02/22/2020   Procedure: COLONOSCOPY WITH PROPOFOL;  Surgeon: Lesly Rubenstein, MD;  Location: [facility] ENDOSCOPY;  Service: Endoscopy;  Laterality: N/A;   ESOPHAGOGASTRODUODENOSCOPY (EGD) WITH PROPOFOL N/A 02/22/2020   Procedure: ESOPHAGOGASTRODUODENOSCOPY (EGD) WITH PROPOFOL;  Surgeon: Lesly Rubenstein, MD;  Location: [facility] ENDOSCOPY;  Service: Endoscopy;  Laterality: N/A;   HAMMER TOE SURGERY Right 05/23/2019   Procedure: HAMMER TOE CORRECTION;  Surgeon: Samara Deist, DPM;  Location: [place];  Service: Podiatry;  Laterality: Right;  Diabetic   OSTECTOMY Right 05/23/2019   Procedure: DOUBLE OSTEOTOMY RIGHT;  Surgeon: Samara Deist, DPM;  Location: [place];  Service: Podiatry;  Laterality: Right;   SKIN CANCER EXCISION      HEMATOLOGY/ONCOLOGY HISTORY:  Oncology History   No history exists.    ALLERGIES:  has No Known Allergies.  MEDICATIONS:  Current Outpatient Medications  Medication Sig Dispense Refill   acetaminophen (TYLENOL) 500 MG tablet Take 500 mg by mouth every 6 (six) hours as needed for  moderate pain, mild pain, fever or headache.     Ascorbic Acid (VITAMIN C) 1000 MG tablet Take 1,000 mg by mouth daily.       carvedilol (COREG) 3.125 MG tablet Take 3.125 mg by mouth 2 (two) times daily with a meal.     celecoxib (CELEBREX) 200 MG capsule Take 1 capsule (200 mg total) by mouth 2 (two) times daily.  60 capsule 0   gabapentin (NEURONTIN) 100 MG capsule Take 1 capsule (100 mg total) by mouth 3 (three) times daily. 270 capsule 0   losartan (COZAAR) 100 MG tablet Take 100 mg by mouth daily.     melatonin 5 MG TABS Take 1 tablet (5 mg total) by mouth at bedtime as needed. 90 tablet 0   metFORMIN (GLUCOPHAGE) 500 MG tablet TAKE 1 TABLET TWICE DAILY WITH A MEAL 180 tablet 1   methocarbamol (ROBAXIN) 500 MG tablet Take 500 mg by mouth 3 (three) times daily.     Multiple Vitamin (MULTIVITAMIN) capsule Take 1 capsule by mouth daily.       omeprazole (PRILOSEC) 20 MG capsule Take 1 capsule (20 mg total) by mouth daily. 90 capsule 3   polyethylene glycol (MIRALAX / GLYCOLAX) 17 g packet Take 17 g by mouth daily. 14 each 0   prednisoLONE acetate (PRED MILD) 0.12 % ophthalmic suspension Place 1 drop into the right eye daily.     simvastatin (ZOCOR) 40 MG tablet TAKE 1 TABLET EVERY DAY  AT  6:00 PM 90 tablet 3   traMADol (ULTRAM) 50 MG tablet Take 50 mg by mouth every 6 (six) hours as needed for severe pain or moderate pain.     No current facility-administered medications for this visit.    VITAL SIGNS: Resp 18   Ht 5' (1.524 m)   Wt 123 lb (55.8 kg)   BMI 24.02 kg/m  Filed Weights   02/24/22 0946  Weight: 123 lb (55.8 kg)    Estimated body mass index is 24.02 kg/m as calculated from the following:   Height as of this encounter: 5' (1.524 m).   Weight as of this encounter: 123 lb (55.8 kg).  LABS: CBC:    Component Value Date/Time   WBC 9.1 02/18/2022 0346   HGB 8.9 (L) 02/18/2022 0346   HCT 28.1 (L) 02/18/2022 0346   PLT 272 02/18/2022 0346   MCV 77.6 (L) 02/18/2022 0346   NEUTROABS 8.3 (H) 02/10/2022 1148   LYMPHSABS 0.8 02/10/2022 1148   MONOABS 0.4 02/10/2022 1148   EOSABS 0.2 02/10/2022 1148   BASOSABS 0.0 02/10/2022 1148   Comprehensive Metabolic Panel:    Component Value Date/Time   NA 134 (L) 02/18/2022 0346   K 3.9 02/18/2022 0346   CL 102 02/18/2022 0346   CO2 24  02/18/2022 0346   BUN 9 02/18/2022 0346   CREATININE 0.46 02/18/2022 0346   GLUCOSE 101 (H) 02/18/2022 0346   CALCIUM 8.9 02/18/2022 0346   AST 27 01/11/2021 2014   ALT 18 01/11/2021 2014   ALKPHOS 68 01/11/2021 2014   BILITOT 0.5 01/11/2021 2014   PROT 7.4 01/11/2021 2014   ALBUMIN 4.1 01/11/2021 2014    RADIOGRAPHIC STUDIES: MR BRAIN W WO CONTRAST  Result Date: 02/16/2022 CLINICAL DATA:  Metastatic disease evaluation EXAM: MRI HEAD WITHOUT AND WITH CONTRAST TECHNIQUE: Multiplanar, multiecho pulse sequences of the brain and surrounding structures were obtained without and with intravenous contrast. CONTRAST:  65mL GADAVIST GADOBUTROL 1 MMOL/ML IV SOLN COMPARISON:  01/11/2021 FINDINGS: Brain: No  restricted diffusion to suggest acute or subacute infarct. No abnormal parenchymal or meningeal enhancement. No acute hemorrhage, mass, mass effect, or midline shift. No hydrocephalus or extra-axial collection. 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 Scattered T2 hyperintense signal in the periventricular white matter, likely the sequela of mild-to-moderate chronic small vessel ischemic disease. Vascular: Normal arterial flow voids. Skull and upper cervical spine: Normal marrow signal. Sinuses/Orbits: Clear paranasal sinuses. Status post bilateral lens replacements. No acute finding in the orbits. Other: Trace fluid in the mastoid air cells. IMPRESSION: No acute intracranial process. No evidence of metastatic disease in the brain. Electronically Signed   By: Merilyn Baba M.D.   On: 02/16/2022 23:45   DG Lumbar Spine 2-3 Views  Result Date: 02/15/2022 CLINICAL DATA:  Fracture L1, fluoroscopic assistance for lumbar fusion EXAM: LUMBAR SPINE - 2-3 VIEW COMPARISON:  02/01/2022 FINDINGS: Fluoroscopic images show posterior surgical fusion from T11-L3 levels. Compression fracture is seen in the body of L1 vertebra. Fluoroscopic time 64 seconds. Radiation dose 43.6 mGy. IMPRESSION: Fluoroscopic  assistance was provided for surgical fusion from T11-L3 levels. Electronically Signed   By: Elmer Picker M.D.   On: 02/15/2022 15:36   DG C-Arm 1-60 Min-No Report  Result Date: 02/15/2022 Fluoroscopy was utilized by the requesting physician.  No radiographic interpretation.   DG C-Arm 1-60 Min-No Report  Result Date: 02/15/2022 Fluoroscopy was utilized by the requesting physician.  No radiographic interpretation.   [place] CORE BIOPSY (LYMPH NODES)  Result Date: 02/11/2022 INDICATION: Mediastinal mass, enlarged left supraclavicular lymph node, L1 vertebral body lesion and possible subtle pancreatic mass. EXAM: ULTRASOUND GUIDED CORE BIOPSY OF LEFT SUPRACLAVICULAR LYMPH NODE MEDICATIONS: None. ANESTHESIA/SEDATION: None. The patient was given 25 mcg of IV fentanyl during the procedure. Formal moderate conscious sedation could not be given due to lack of NPO status. PROCEDURE: The procedure, risks, benefits, and alternatives were explained to the patient. Questions regarding the procedure were encouraged and answered. The patient understands and consents to the procedure. A time-out was performed prior to initiating the procedure. Ultrasound was used to localize a left supraclavicular lymph node to correlate with CT findings. The left neck was prepped with chlorhexidine in a sterile fashion, and a sterile drape was applied covering the operative field. A sterile gown and sterile gloves were used for the procedure. Local anesthesia was provided with 1% Lidocaine. Under ultrasound guidance, 18 gauge core biopsy samples were obtained of a left supraclavicular lymph node. Material was submitted in formalin. Additional ultrasound was performed. COMPLICATIONS: None immediate. FINDINGS: Hypoechoic, abnormal appearing lymph node is identified just lateral to the left internal jugular vein measuring approximately 1.2 x 1.0 x 1.2 cm. Solid core biopsy samples were obtained. IMPRESSION: Ultrasound-guided core biopsy  performed of a 1.2 cm left supraclavicular lymph node. Electronically Signed   By: Aletta Edouard M.D.   On: 02/11/2022 16:15   CT T-SPINE NO CHARGE  Result Date: 02/10/2022 CLINICAL DATA:  Pathologic fracture at L1 EXAM: CT THORACIC AND LUMBAR SPINE WITHOUT CONTRAST TECHNIQUE: Multidetector CT imaging of the thoracic and lumbar spine was performed without contrast. Multiplanar CT image reconstructions were also generated. RADIATION DOSE REDUCTION: This exam was performed according to the departmental dose-optimization program which includes automated exposure control, adjustment of the mA and/or kV according to patient size and/or use of iterative reconstruction technique. COMPARISON:  MRI lumbar spine 02/11/2012 FINDINGS: CT THORACIC SPINE FINDINGS Alignment: No vertebral subluxation is observed. Thoracic kyphosis noted. Vertebrae: No thoracic spine fracture or acute bony finding.  There is multilevel bridging spurring anterior to the thoracic spine at the T2 through T10 levels. Paraspinal and other soft tissues: Please see dedicated CT chest report. Disc levels: No substantial thoracic spine impingement is noted. There is left paracentral posterior intervertebral spurring at the T8-9 level but not causing central narrowing of the thecal sac. CT LUMBAR SPINE FINDINGS Segmentation: The lowest lumbar type non-rib-bearing vertebra is labeled as L5. Alignment: 5 mm of grade 1 degenerative anterolisthesis at L4-5. Vertebrae: Pathologic fracture at L1 with about 65% loss of vertebral body height, with the posterior contour of the vertebral body bulging back 6 mm in a manner highly suspicious for pathologic underlying tumor. The abnormal associated lucency in the vertebral body extends into both pedicles and into the transverse processes (right greater than left, with expansion of the pedicles and right transverse process. There is some mild extension into the right lamina on image 25 series 4. Mild degenerative  endplate sclerosis at the L3-4 and L4-5 levels with loss of intervertebral disc height. No other lytic lesions are identified in the lumbar spine. Paraspinal and other soft tissues: Please see dedicated CT abdomen report. Disc levels: T12-L1: Borderline bilateral foraminal stenosis due to tumor expansion of the bilateral pedicles. Cannot exclude right subarticular lateral recess stenosis given the tumor expansion in this vicinity borderline central narrowing of the thecal sac due to posterior bony retropulsion/bulging. L1-2: No impingement. L2-3: No impingement.  Diffuse disc bulge. L3-4: No impingement.  Right eccentric disc bulge. L4-5: Moderate to prominent right and mild left foraminal stenosis and borderline central narrowing of the thecal sac due to subluxation, disc uncovering, and facet arthropathy. L5-S1: No impingement.  Central disc protrusion. IMPRESSION: 1. L1 pathologic fracture with 65% loss of vertebral body height and posterior bulging of the vertebral body by 0.6 cm. Tumor involvement of the bilateral pedicles, right greater than left transverse process, and right lamina. Bony expansion at this level contributes to borderline impingement at T12-L1. No other osseous metastatic disease in the thoracolumbar spine identified. 2. Moderate to prominent impingement at L4-5 due to subluxation with disc uncovering and facet arthropathy. Electronically Signed   By: Van Clines M.D.   On: 02/10/2022 17:37   CT L-SPINE NO CHARGE  Result Date: 02/10/2022 CLINICAL DATA:  Pathologic fracture at L1 EXAM: CT THORACIC AND LUMBAR SPINE WITHOUT CONTRAST TECHNIQUE: Multidetector CT imaging of the thoracic and lumbar spine was performed without contrast. Multiplanar CT image reconstructions were also generated. RADIATION DOSE REDUCTION: This exam was performed according to the departmental dose-optimization program which includes automated exposure control, adjustment of the mA and/or kV according to patient  size and/or use of iterative reconstruction technique. COMPARISON:  MRI lumbar spine 02/11/2012 FINDINGS: CT THORACIC SPINE FINDINGS Alignment: No vertebral subluxation is observed. Thoracic kyphosis noted. Vertebrae: No thoracic spine fracture or acute bony finding. There is multilevel bridging spurring anterior to the thoracic spine at the T2 through T10 levels. Paraspinal and other soft tissues: Please see dedicated CT chest report. Disc levels: No substantial thoracic spine impingement is noted. There is left paracentral posterior intervertebral spurring at the T8-9 level but not causing central narrowing of the thecal sac. CT LUMBAR SPINE FINDINGS Segmentation: The lowest lumbar type non-rib-bearing vertebra is labeled as L5. Alignment: 5 mm of grade 1 degenerative anterolisthesis at L4-5. Vertebrae: Pathologic fracture at L1 with about 65% loss of vertebral body height, with the posterior contour of the vertebral body bulging back 6 mm in a manner highly suspicious for pathologic  underlying tumor. The abnormal associated lucency in the vertebral body extends into both pedicles and into the transverse processes (right greater than left, with expansion of the pedicles and right transverse process. There is some mild extension into the right lamina on image 25 series 4. Mild degenerative endplate sclerosis at the L3-4 and L4-5 levels with loss of intervertebral disc height. No other lytic lesions are identified in the lumbar spine. Paraspinal and other soft tissues: Please see dedicated CT abdomen report. Disc levels: T12-L1: Borderline bilateral foraminal stenosis due to tumor expansion of the bilateral pedicles. Cannot exclude right subarticular lateral recess stenosis given the tumor expansion in this vicinity borderline central narrowing of the thecal sac due to posterior bony retropulsion/bulging. L1-2: No impingement. L2-3: No impingement.  Diffuse disc bulge. L3-4: No impingement.  Right eccentric disc  bulge. L4-5: Moderate to prominent right and mild left foraminal stenosis and borderline central narrowing of the thecal sac due to subluxation, disc uncovering, and facet arthropathy. L5-S1: No impingement.  Central disc protrusion. IMPRESSION: 1. L1 pathologic fracture with 65% loss of vertebral body height and posterior bulging of the vertebral body by 0.6 cm. Tumor involvement of the bilateral pedicles, right greater than left transverse process, and right lamina. Bony expansion at this level contributes to borderline impingement at T12-L1. No other osseous metastatic disease in the thoracolumbar spine identified. 2. Moderate to prominent impingement at L4-5 due to subluxation with disc uncovering and facet arthropathy. Electronically Signed   By: Van Clines M.D.   On: 02/10/2022 17:37   CT CHEST ABDOMEN PELVIS W CONTRAST  Result Date: 02/10/2022 CLINICAL DATA:  Occult malignancy, staging workup.  Severe pain. * Tracking Code: BO * EXAM: CT CHEST, ABDOMEN, AND PELVIS WITH CONTRAST TECHNIQUE: Multidetector CT imaging of the chest, abdomen and pelvis was performed following the standard protocol during bolus administration of intravenous contrast. RADIATION DOSE REDUCTION: This exam was performed according to the departmental dose-optimization program which includes automated exposure control, adjustment of the mA and/or kV according to patient size and/or use of iterative reconstruction technique. CONTRAST:  176mL OMNIPAQUE IOHEXOL 300 MG/ML  SOLN COMPARISON:  Lumbar MRI 02/10/2022 and CT abdomen pelvis 01/02/2013 FINDINGS: CT CHEST FINDINGS Cardiovascular: Coronary, aortic arch, and branch vessel atherosclerotic vascular disease. Mediastinum/Nodes: Centrally necrotic 3.1 by 2.4 by 2.9 cm anterior mediastinal mass in the left prevascular region on image 17 series 2, with mild mass effect on the brachiocephalic vein as shown on image 16 series 2. This is in the immediate vicinity of the phrenic nerve,  and the elevation of the left hemidiaphragm is new compared to 08/11/2019 chest radiograph, raising suspicion for phrenic nerve impingement leading to diaphragmatic dysfunction. Left supraclavicular node, 1.0 cm in short axis on image 9 series 2. Right eccentric subcarinal node 1.0 cm in short axis on image 28 series 2. Lungs/Pleura: Scattered scarring and/or atelectasis in the left lower lobe and lingula. This is particularly notable anteriorly in the left lower lobe. Bronchiectasis and volume loss medially in the right middle lobe for example on image 98 series 3. Scattered tree-in-bud reticulonodular opacities are present in the lungs, right greater than left, characteristic for atypical infectious bronchiolitis. In light of the pathologic adenopathy in the chest this raises questions as to which of the visualized small nodules might be malignant versus infectious. One of the largest nodules is a 12 by 7 by 11 mm (volume = 500 mm^3) nodule in the right lateral costophrenic angle on image 107 of series 3, with  clustered reticulonodular opacities just cephalad to this lesion. Peripheral atelectasis versus mild pleural thickening posteriorly in the left lower lobe. Musculoskeletal: As noted above, the left hemidiaphragm is elevated, and the left anterior mediastinal mass which runs directly in the vicinity of the left phrenic nerve is implicated as a likely cause. Degenerative arthropathy at the sternoclavicular joints. Thoracic spondylosis. CT ABDOMEN PELVIS FINDINGS Hepatobiliary: Fluid density sharply defined 1.4 by 1.0 cm cyst posteriorly in segment 6 of the liver on image 59 series 2, this previously measured 0.6 cm in diameter back in 2014. Gallbladder unremarkable. No other liver lesion identified. Pancreas: Subtle abnormal hypoenhancement inferiorly in the pancreatic body measuring about 1.2 by 0.7 by 1.3 cm on image 38 of series 5. The pancreatic head is also mildly hypoenhancing. This could be inflammatory  but infiltrative pancreatic adenocarcinoma cannot be excluded. This warrants detailed workup with either pancreatic protocol CT or MRI with and without contrast. No dorsal pancreatic duct dilatation identified. Spleen: Unremarkable Adrenals/Urinary Tract: Unremarkable Stomach/Bowel: Prominent stool throughout the colon favors constipation. Sigmoid colon diverticulosis is present and there is an inflamed diverticula and inflammatory findings the junction of the descending and sigmoid colon on image 31 series 5 suggesting mild acute diverticulitis. Tumor in this vicinity with local resulting inflammation is a differential diagnostic consideration given the findings elsewhere in today's examination. Vascular/Lymphatic: Atherosclerosis is present, including aortoiliac atherosclerotic disease. There is atheromatous calcification but only mild stenosis proximally in the superior mesenteric artery, with no findings of occlusion. No pathologic adenopathy observed in the abdomen/pelvis. Reproductive: Unremarkable Other: No supplemental non-categorized findings. Musculoskeletal: As shown on the lumbar spine MRI, there is a pathologic fracture at L1 with diffuse abnormal lysis involving the vertebral body, bilateral pedicles, and bilateral transverse processes at this level with about 65% of vertebral body height and with a posterior bulging contour of the vertebral body by about 5-6 mm. Further detail on dedicated lumbar spine CT. IMPRESSION: 1. 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 There is also mild left supraclavicular adenopathy. 2. Scattered tree-in-bud reticulonodular opacities in the lungs, right greater than left, characteristic for atypical infectious  bronchiolitis. One of the largest nodules is a 12 by 7 by 11 mm nodule in the right lateral costophrenic angle, which could be malignant or infectious. 3. Subtle abnormal hypoenhancement inferiorly in the pancreatic body measuring about 1.2 by 0.7 by 1.3 cm, along with some indistinct hypoenhancement in the pancreatic head. This could be inflammatory but infiltrative pancreatic adenocarcinoma cannot be excluded. This warrants detailed workup with either pancreatic protocol CT or MRI with and without contrast. PET-CT could also be helpful in this case. 4. As shown on lumbar spine MRI, there is a pathologic (malignant) fracture at L1 with diffuse abnormal lysis involving the vertebral body, bilateral pedicles, and bilateral transverse processes at this level with about 65% of vertebral body height and with a posterior bulging contour of the vertebral body by about 5-6 mm. Further detail on dedicated lumbar spine CT. 5. Mild acute sigmoid colon diverticulitis. Tumor in the vicinity of the local inflammation along the sigmoid colon is not totally excluded but is a less likely differential diagnostic consideration. 6. Aortic atherosclerosis.  Coronary atherosclerosis. Aortic Atherosclerosis (ICD10-I70.0). Electronically Signed   By: Van Clines M.D.   On:  02/10/2022 17:24   MR LUMBAR SPINE WO CONTRAST  Result Date: 02/10/2022 CLINICAL DATA:  Known compression fracture at L1. worsening pain EXAM: MRI LUMBAR SPINE WITHOUT CONTRAST TECHNIQUE: Multiplanar, multisequence MR imaging of the lumbar spine was performed. No intravenous contrast was administered. COMPARISON:  None Available. FINDINGS: Segmentation:  Standard. Alignment: Trace retrolisthesis of L3 on L4. Grade 1 anterolisthesis of L4 on L5. Vertebrae: There is a acute to subacute compression deformity at the L1 vertebral body level with posterior bulging of the cortex. This results in mild spinal canal narrowing at this level. There is diffuse T1  hypointense signal abnormality in the L1 vertebral body level which involve the bilateral pedicles, as well as the bilateral transverse processes (series 9, image 11). Cortex, particularly at the right transverse process is poorly visualized. Conus medullaris and cauda equina: Conus extends to the L1-L2 level. Conus and cauda equina appear normal. 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 There is atrophy of the paraspinal musculature. No retroperitoneal lymphadenopathy. Bilateral adrenal glands are normal in appearance. Disc levels: T11-T12: No evidence of spinal canal or neural foraminal stenosis. T12-L1: Mild spinal canal stenosis. Mild bilateral neural foraminal stenosis. L1-L2: No spinal canal stenosis. Moderate right neural foraminal stenosis. No left neural foraminal stenosis. L2-L3: Circumferential disc bulge. Mild bilateral facet degenerative change. Mild spinal canal stenosis. No neural foraminal stenosis. L3-L4: Circumferential disc bulge. Mild bilateral facet degenerative change. Moderate left neural foraminal stenosis. No right neural foraminal stenosis. Mild spinal canal narrowing. L4-L5: Grade 1 anterolisthesis. Severe bilateral facet degenerative change. Severe right neural foraminal stenosis. Mild left neural foraminal stenosis. Mild spinal canal narrowing. L5-S1: No spinal canal or neural foraminal stenosis. IMPRESSION: Acute to subacute compression deformity at the L1 vertebral body level with posterior bulging of the cortex resulting in mild spinal canal stenosis at this level. There is diffuse T1 hypointense signal abnormality in the L1 vertebral body with involvement of the bilateral pedicles, as well as the bilateral transverse processes. This raises concerns for a pathologic fracture. Recommend further evaluation with CT of the lumbar spine and correlate with history of malignancy. Electronically  Signed   By: Marin Roberts M.D.   On: 02/10/2022 15:57    PERFORMANCE STATUS (ECOG) : 1 - Symptomatic but completely ambulatory  Review of Systems Unless otherwise noted, a complete review of systems is negative.  Physical Exam General: NAD Cardiovascular: regular rate and rhythm Pulmonary: clear ant fields Abdomen: soft, nontender, + bowel sounds GU: no suprapubic tenderness Extremities: no edema, no joint deformities Skin: no rashes Neurological: Weakness but otherwise nonfocal  IMPRESSION: Post hospital follow-up with patient and daughter, [name].  Daughter reports that patient is doing better since discharging home.  Patient's cognition has returned to baseline.  However, patient states that she still feels unwell but does not elucidate on specifics.  She does continue to endorse persistent back pain.  Since returning home, family have given a variety of pain medications that they had on hand including tramadol, Norco, and oxycodone.  Patient reports that the oxycodone has been most effective but family only had enough supply for a couple of doses.  No adverse effects from pain medications reported since patient was discharged from the hospital.  Additionally, patient has continued to take Celebrex and gabapentin.  They have tried Robaxin but reports that patient had better tolerance of Flexeril.  Discussed strategies for pain management.  We will continue  to treat medically and try to maximize pain regimen while we await the start of XRT.  We will discontinue tramadol and Norco.  We will send a refill for oxycodone 10 mg tablets and liberalize frequency to every 4 hours as needed.  We will rotate from Robaxin to Flexeril per patient request.  Continue to maximize daily bowel regimen.  She reports good effect with MiraLAX.  Oral intake has been poor.  Discussed importance of maximizing calorie and protein intake.  I recommended starting oral nutritional supplements daily.  We will send  referral to nutrition.  We will also send for labs and plan for IV fluids today.  Patient reports that she has completed ACP documents, which on file with PCP office.  They will try to get [place] a copy to have on file here.  PLAN: -Continue current scope of treatment -Oxycodone 10 mg every 4 hours as needed #60 -DC tramadol and Norco -DC Robaxin and start Flexeril -Continue Celebrex and gabapentin -Can consider a long-acting opioid if needed -Daily bowel regimen -Referral to nutrition -Labs and fluids today -Patient will return to clinic next week to see Dr. Janese Banks.  I will follow-up with her by telephone in 2 weeks (can see in person if needed)   Patient expressed understanding and was in agreement with this plan. She also understands that She can call the clinic at any time with any questions, concerns, or complaints.     Time Total: 25 minutes  Visit consisted of counseling and education dealing with the complex and emotionally intense issues of symptom management and palliative care in the setting of serious and potentially life-threatening illness.Greater than 50%  of this time was spent counseling and coordinating care related to the above assessment and plan.  Signed by: Altha Harm, PhD, NP-C

## 2022-02-25 ENCOUNTER — Other Ambulatory Visit: Payer: Medicare HMO

## 2022-02-25 LAB — IRON AND TIBC
Iron: 90 ug/dL (ref 28–170)
Saturation Ratios: 21 % (ref 10.4–31.8)
TIBC: 440 ug/dL (ref 250–450)
UIBC: 350 ug/dL

## 2022-02-25 LAB — FERRITIN: Ferritin: 31 ng/mL (ref 11–307)

## 2022-03-01 ENCOUNTER — Ambulatory Visit
Admission: RE | Admit: 2022-03-01 | Discharge: 2022-03-01 | Disposition: A | Discharge status: Home / Self Care | Payer: Medicare HMO | Source: Ambulatory Visit | Attending: Radiation Oncology | Admitting: Radiation Oncology

## 2022-03-01 DIAGNOSIS — Z510 Encounter for antineoplastic radiation therapy: Secondary | ICD-10-CM | POA: Diagnosis not present

## 2022-03-01 NOTE — Progress Notes (Unsigned)
   REFERRING PHYSICIAN:  Lynnell Jude, Md [address] [place],  [place] [address]  DOS: PSF T11-L3 for pathologic L1 compression fracture on 02/15/22  HISTORY OF PRESENT ILLNESS: Amber Glenn is approximately 2 weeks status post PSF T11-L3 for pathologic L1 compression fracture. Oncology has given her oxycodone 10mg   to take q 4 hours prn. She to stop robaxin and start flexeril. She continues on celebrex and neurontin. She was to wear brace for comfort only.   She continues with pain in her back. She is on oxycodone and flexeril as above. No refills needed.    PHYSICAL EXAMINATION:  General: Patient is well developed, well nourished, calm, collected, and in no apparent distress.   NEUROLOGICAL:  General: In no acute distress.   Awake, alert, oriented to person, place, and time.  Pupils equal round and reactive to light.  Facial tone is symmetric.     Strength:            Side Iliopsoas Quads Hamstring PF DF EHL  R 5 5 5 5 5 5   L 5 5 5 5 5 5    Incisions c/d/i   ROS (Neurologic):  Negative except as noted above  IMAGING: Nothing new to review.   ASSESSMENT/PLAN:  Amber Glenn is doing well s/p above surgery. Treatment options reviewed with patient and her daughter. Following plan made:   - I have advised the patient to lift up to 10 pounds until 6 weeks after surgery (follow up with Dr. Izora Ribas).  - Reviewed wound care.  - No bending, twisting, or lifting.  - Continue on current medications including oxycodone and flexeril per oncology.  - Continue on celebrex and neurontin.   - Follow up as scheduled in 4 weeks and prn.   Advised to contact the office if any questions or concerns arise.  Amber Boot PA-C Department of neurosurgery

## 2022-03-02 ENCOUNTER — Encounter: Payer: Self-pay | Admitting: Orthopedic Surgery

## 2022-03-02 ENCOUNTER — Ambulatory Visit (INDEPENDENT_AMBULATORY_CARE_PROVIDER_SITE_OTHER): Payer: Medicare HMO | Admitting: Orthopedic Surgery

## 2022-03-02 VITALS — BP 114/64 | Temp 97.8°F | Ht 60.0 in | Wt 123.0 lb

## 2022-03-02 DIAGNOSIS — Z981 Arthrodesis status: Secondary | ICD-10-CM

## 2022-03-02 DIAGNOSIS — Z09 Encounter for follow-up examination after completed treatment for conditions other than malignant neoplasm: Secondary | ICD-10-CM

## 2022-03-02 DIAGNOSIS — M8448XD Pathological fracture, other site, subsequent encounter for fracture with routine healing: Secondary | ICD-10-CM

## 2022-03-02 DIAGNOSIS — M8448XS Pathological fracture, other site, sequela: Secondary | ICD-10-CM

## 2022-03-02 DIAGNOSIS — M532X6 Spinal instabilities, lumbar region: Secondary | ICD-10-CM

## 2022-03-03 ENCOUNTER — Other Ambulatory Visit: Payer: Self-pay | Admitting: *Deleted

## 2022-03-03 ENCOUNTER — Inpatient Hospital Stay (HOSPITAL_BASED_OUTPATIENT_CLINIC_OR_DEPARTMENT_OTHER): Payer: Medicare HMO | Admitting: Oncology

## 2022-03-03 ENCOUNTER — Encounter: Payer: Self-pay | Admitting: Oncology

## 2022-03-03 ENCOUNTER — Inpatient Hospital Stay: Payer: Medicare HMO

## 2022-03-03 VITALS — BP 104/46 | HR 121 | Temp 96.8°F | Resp 16 | Wt 122.0 lb

## 2022-03-03 DIAGNOSIS — Z7189 Other specified counseling: Secondary | ICD-10-CM | POA: Diagnosis not present

## 2022-03-03 DIAGNOSIS — Z510 Encounter for antineoplastic radiation therapy: Secondary | ICD-10-CM | POA: Diagnosis not present

## 2022-03-03 DIAGNOSIS — R1310 Dysphagia, unspecified: Secondary | ICD-10-CM

## 2022-03-03 DIAGNOSIS — C3490 Malignant neoplasm of unspecified part of unspecified bronchus or lung: Secondary | ICD-10-CM | POA: Diagnosis not present

## 2022-03-03 NOTE — Progress Notes (Signed)
Pt states she has been having trouble swallowing medications and is causing pt to vomit. Pt is not feeling well this morning. States feels like medication get stuck in her throat.

## 2022-03-03 NOTE — Progress Notes (Signed)
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  She smoked about 2 packs of cigarettes per day but quit smoking in 2010.  She presented to the ER withSymptoms of significant back pain MRIs lumbar spine without contrast showed pathologic fracture of the L1 vertebral body with posterior bulging of the cortex resulting in mild spinal canal stenosis.  Diffuse T1 hypointense abnormality in the L1 vertebral body with involvement of bilateral pedicles and transverse processes.  CT chest abdomen and pelvis with contrast showed 1 cm left supraclavicular adenopathy.  1 cm right subcarinal lymph node.  Necrotic 3.1 x 2.4 x 2.9 cm anterior mediastinal mass in the left prevascular region with mild mass effect on the brachiocephalic vein.  Possible phrenic nerve involvement.  Mild acute sigmoid colon diverticulitis.   Patient had biopsy of the left supraclavicular lymph node which was consistent with adenocarcinoma of lung primary.  NGS testing did not show any actionable mutations.  K-ras G12 D mutation detected.  MSI stable.  Patient underwent percutaneous fixation of the L1 vertebral body.  She is currently undergoing palliative radiation  Interval history-her pain is currentlyWell-controlled with as needed oxycodone.  She is also on gabapentin.  Currently reports difficulty swallowing which has started since her surgery for the back.  Feels like her food is getting stuck in her throat  ECOG PS- 1 Pain  scale- 3 Opioid associated constipation- no  Review of systems- Review of Systems  Constitutional:  Negative for chills, fever, malaise/fatigue and weight loss.  HENT:  Negative for congestion, ear discharge and nosebleeds.   Eyes:  Negative for blurred vision.  Respiratory:  Negative for cough, hemoptysis, sputum production, shortness of breath and wheezing.   Cardiovascular:  Negative for chest pain, palpitations, orthopnea and claudication.  Gastrointestinal:  Negative for abdominal pain, blood in stool, constipation, diarrhea, heartburn, melena, nausea and vomiting.  Genitourinary:  Negative for dysuria, flank pain, frequency, hematuria and urgency.  Musculoskeletal:  Negative for back pain, joint pain and myalgias.  Skin:  Negative for rash.  Neurological:  Negative for dizziness, tingling, focal weakness, seizures, weakness and headaches.  Endo/Heme/Allergies:  Does not bruise/bleed easily.  Psychiatric/Behavioral:  Negative for depression and suicidal ideas. The patient does not have insomnia.       Allergies  Allergen Reactions   Tramadol      Past Medical History:  Diagnosis Date   Arthritis    hands and feet   Cataract    right   Complication of anesthesia    sometimes have a hard time waking up   Diabetes mellitus    Borderline Diabetes   GERD (gastroesophageal reflux disease)    HTN (hypertension)    Hyperlipidemia    Motion sickness    Post corneal transplant    right     Past Surgical History:  Procedure Laterality Date   APPLICATION OF INTRAOPERATIVE CT SCAN  02/15/2022   Procedure: APPLICATION OF INTRAOPERATIVE CT SCAN;  Surgeon: Meade Maw, MD;  Location: [facility] ORS;  Service: Neurosurgery;;   BASAL  CELL CARCINOMA EXCISION     Dr. Manley Mason   CARPAL TUNNEL RELEASE     right   COLONOSCOPY WITH PROPOFOL N/A 12/03/2015   Procedure: COLONOSCOPY WITH PROPOFOL;  Surgeon: Manya Silvas, MD;  Location: [facility] ENDOSCOPY;  Service: Endoscopy;   Laterality: N/A;   COLONOSCOPY WITH PROPOFOL N/A 02/22/2020   Procedure: COLONOSCOPY WITH PROPOFOL;  Surgeon: Lesly Rubenstein, MD;  Location: [facility] ENDOSCOPY;  Service: Endoscopy;  Laterality: N/A;   ESOPHAGOGASTRODUODENOSCOPY (EGD) WITH PROPOFOL N/A 02/22/2020   Procedure: ESOPHAGOGASTRODUODENOSCOPY (EGD) WITH PROPOFOL;  Surgeon: Lesly Rubenstein, MD;  Location: [facility] ENDOSCOPY;  Service: Endoscopy;  Laterality: N/A;   HAMMER TOE SURGERY Right 05/23/2019   Procedure: HAMMER TOE CORRECTION;  Surgeon: Samara Deist, DPM;  Location: [place];  Service: Podiatry;  Laterality: Right;  Diabetic   OSTECTOMY Right 05/23/2019   Procedure: DOUBLE OSTEOTOMY RIGHT;  Surgeon: Samara Deist, DPM;  Location: [place];  Service: Podiatry;  Laterality: Right;   SKIN CANCER EXCISION      Social History   Socioeconomic History   Marital status: Widowed    Spouse name: Not on file   Number of children: Not on file   Years of education: Not on file   Highest education level: Not on file  Occupational History    Employer: [employer]    Comment: Currently not employed  Tobacco Use   Smoking status: Former    Types: Cigarettes    Quit date: 01/13/2009    Years since quitting: 13.1   Smokeless tobacco: Never  Substance and Sexual Activity   Alcohol use: No   Drug use: No   Sexual activity: Yes    Birth control/protection: Post-menopausal  Other Topics Concern   Not on file  Social History Narrative   Not on file   Social Determinants of Health   Financial Resource Strain: Not on file  Food Insecurity: No Food Insecurity (02/10/2022)   Hunger Vital Sign    Worried About Running Out of Food in the Last Year: Never true    Ran Out of Food in the Last Year: Never true  Transportation Needs: No Transportation Needs (02/10/2022)   PRAPARE - Hydrologist (Medical): No    Lack of Transportation (Non-Medical): No  Physical Activity: Not on file   Stress: Not on file  Social Connections: Not on file  Intimate Partner Violence: Not At Risk (02/10/2022)   Humiliation, Afraid, Rape, and Kick questionnaire    Fear of Current or Ex-Partner: No    Emotionally Abused: No    Physically Abused: No    Sexually Abused: No    Family History  Problem Relation Age of Onset   Heart disease Mother    Heart disease Father    Heart disease Maternal Grandmother    Heart disease Maternal Grandfather    Breast cancer Sister 2     Current Outpatient Medications:    acetaminophen (TYLENOL) 500 MG tablet, Take 500 mg by mouth every 6 (six) hours as needed for moderate pain, mild pain, fever or headache., Disp: , Rfl:    Ascorbic Acid (VITAMIN C) 1000 MG tablet, Take 1,000 mg by mouth daily.  , Disp: , Rfl:    carvedilol (COREG) 3.125 MG tablet, Take 3.125 mg by mouth 2 (two) times daily with a meal., Disp: , Rfl:    celecoxib (CELEBREX) 200 MG capsule, Take 1 capsule (200 mg total) by mouth 2 (two) times daily., Disp: 60  capsule, Rfl: 0   cyclobenzaprine (FLEXERIL) 5 MG tablet, Take 1 tablet (5 mg total) by mouth 3 (three) times daily as needed for muscle spasms., Disp: 30 tablet, Rfl: 0   Docusate Calcium (STOOL SOFTENER PO), Take 1 capsule by mouth daily as needed (constipation)., Disp: , Rfl:    Ferrous Sulfate (IRON) 325 (65 Fe) MG TABS, Take 1 tablet by mouth daily at 12 noon., Disp: , Rfl:    gabapentin (NEURONTIN) 100 MG capsule, Take 1 capsule (100 mg total) by mouth 3 (three) times daily., Disp: 270 capsule, Rfl: 0   losartan (COZAAR) 100 MG tablet, Take 100 mg by mouth daily., Disp: , Rfl:    melatonin 5 MG TABS, Take 1 tablet (5 mg total) by mouth at bedtime as needed., Disp: 90 tablet, Rfl: 0   metFORMIN (GLUCOPHAGE) 500 MG tablet, TAKE 1 TABLET TWICE DAILY WITH A MEAL, Disp: 180 tablet, Rfl: 1   Multiple Vitamin (MULTIVITAMIN) capsule, Take 1 capsule by mouth daily.  , Disp: , Rfl:    omeprazole (PRILOSEC) 20 MG capsule, Take 1 capsule  (20 mg total) by mouth daily., Disp: 90 capsule, Rfl: 3   Oxycodone HCl 10 MG TABS, Take 1 tablet (10 mg total) by mouth every 4 (four) hours as needed (pain)., Disp: 60 tablet, Rfl: 0   polyethylene glycol (MIRALAX / GLYCOLAX) 17 g packet, Take 17 g by mouth daily., Disp: 14 each, Rfl: 0   prednisoLONE acetate (PRED MILD) 0.12 % ophthalmic suspension, Place 1 drop into the right eye daily., Disp: , Rfl:    simvastatin (ZOCOR) 40 MG tablet, TAKE 1 TABLET EVERY DAY  AT  6:00 PM, Disp: 90 tablet, Rfl: 3  Physical exam:  Vitals:   03/03/22 0908  BP: (!) 104/46  Pulse: (!) 121  Resp: 16  Temp: (!) 96.8 F (36 C)  SpO2: 96%  Weight: 122 lb (55.3 kg)   Physical Exam Constitutional:      General: She is not in acute distress. Cardiovascular:     Rate and Rhythm: Normal rate and regular rhythm.     Heart sounds: Normal heart sounds.  Pulmonary:     Effort: Pulmonary effort is normal.     Breath sounds: Normal breath sounds.  Skin:    General: Skin is warm and dry.  Neurological:     Mental Status: She is alert and oriented to person, place, and time.         Latest Ref Rng & Units 02/24/2022   10:40 AM  CMP  Glucose 70 - 99 mg/dL 141   BUN 8 - 23 mg/dL 13   Creatinine 0.44 - 1.00 mg/dL 0.58   Sodium 135 - 145 mmol/L 132   Potassium 3.5 - 5.1 mmol/L 4.3   Chloride 98 - 111 mmol/L 98   CO2 22 - 32 mmol/L 24   Calcium 8.9 - 10.3 mg/dL 9.7   Total Protein 6.5 - 8.1 g/dL 6.8   Total Bilirubin 0.3 - 1.2 mg/dL 0.4   Alkaline Phos 38 - 126 U/L 94   AST 15 - 41 U/L 20   ALT 0 - 44 U/L 15       Latest Ref Rng & Units 02/24/2022   10:40 AM  CBC  WBC 4.0 - 10.5 K/uL 13.7   Hemoglobin 12.0 - 15.0 g/dL 11.4   Hematocrit 36.0 - 46.0 % 37.5   Platelets 150 - 400 K/uL 333     No images are attached to the  encounter.  MR BRAIN W WO CONTRAST  Result Date: 02/16/2022 CLINICAL DATA:  Metastatic disease evaluation EXAM: MRI HEAD WITHOUT AND WITH CONTRAST TECHNIQUE: Multiplanar,  multiecho pulse sequences of the brain and surrounding structures were obtained without and with intravenous contrast. CONTRAST:  4m GADAVIST GADOBUTROL 1 MMOL/ML IV SOLN COMPARISON:  01/11/2021 FINDINGS: Brain: No restricted diffusion to suggest acute or subacute infarct. No abnormal parenchymal or meningeal enhancement. No acute hemorrhage, mass, mass effect, or midline shift. No hydrocephalus or extra-axial collection. 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 Scattered T2 hyperintense signal in the periventricular white matter, likely the sequela of mild-to-moderate chronic small vessel ischemic disease. Vascular: Normal arterial flow voids. Skull and upper cervical spine: Normal marrow signal. Sinuses/Orbits: Clear paranasal sinuses. Status post bilateral lens replacements. No acute finding in the orbits. Other: Trace fluid in the mastoid air cells. IMPRESSION: No acute intracranial process. No evidence of metastatic disease in the brain. Electronically Signed   By: AMerilyn BabaM.D.   On: 02/16/2022 23:45   DG Lumbar Spine 2-3 Views  Result Date: 02/15/2022 CLINICAL DATA:  Fracture L1, fluoroscopic assistance for lumbar fusion EXAM: LUMBAR SPINE - 2-3 VIEW COMPARISON:  02/01/2022 FINDINGS: Fluoroscopic images show posterior surgical fusion from T11-L3 levels. Compression fracture is seen in the body of L1 vertebra. Fluoroscopic time 64 seconds. Radiation dose 43.6 mGy. IMPRESSION: Fluoroscopic assistance was provided for surgical fusion from T11-L3 levels. Electronically Signed   By: PElmer PickerM.D.   On: 02/15/2022 15:36   DG C-Arm 1-60 Min-No Report  Result Date: 02/15/2022 Fluoroscopy was utilized by the requesting physician.  No radiographic interpretation.   DG C-Arm 1-60 Min-No Report  Result Date: 02/15/2022 Fluoroscopy was utilized by the requesting physician.  No radiographic interpretation.   UK[place]CORE BIOPSY (LYMPH NODES)  Result Date: 02/11/2022 INDICATION:  Mediastinal mass, enlarged left supraclavicular lymph node, L1 vertebral body lesion and possible subtle pancreatic mass. EXAM: ULTRASOUND GUIDED CORE BIOPSY OF LEFT SUPRACLAVICULAR LYMPH NODE MEDICATIONS: None. ANESTHESIA/SEDATION: None. The patient was given 25 mcg of IV fentanyl during the procedure. Formal moderate conscious sedation could not be given due to lack of NPO status. PROCEDURE: The procedure, risks, benefits, and alternatives were explained to the patient. Questions regarding the procedure were encouraged and answered. The patient understands and consents to the procedure. A time-out was performed prior to initiating the procedure. Ultrasound was used to localize a left supraclavicular lymph node to correlate with CT findings. The left neck was prepped with chlorhexidine in a sterile fashion, and a sterile drape was applied covering the operative field. A sterile gown and sterile gloves were used for the procedure. Local anesthesia was provided with 1% Lidocaine. Under ultrasound guidance, 18 gauge core biopsy samples were obtained of a left supraclavicular lymph node. Material was submitted in formalin. Additional ultrasound was performed. COMPLICATIONS: None immediate. FINDINGS: Hypoechoic, abnormal appearing lymph node is identified just lateral to the left internal jugular vein measuring approximately 1.2 x 1.0 x 1.2 cm. Solid core biopsy samples were obtained. IMPRESSION: Ultrasound-guided core biopsy performed of a 1.2 cm left supraclavicular lymph node. Electronically Signed   By: GAletta EdouardM.D.   On: 02/11/2022 16:15   CT T-SPINE NO CHARGE  Result Date: 02/10/2022 CLINICAL DATA:  Pathologic fracture at L1 EXAM: CT THORACIC AND LUMBAR SPINE WITHOUT CONTRAST TECHNIQUE: Multidetector CT imaging of the thoracic and lumbar spine was performed without contrast. Multiplanar CT image reconstructions were also generated. RADIATION DOSE REDUCTION: This exam was  performed according to the  departmental dose-optimization program which includes automated exposure control, adjustment of the mA and/or kV according to patient size and/or use of iterative reconstruction technique. COMPARISON:  MRI lumbar spine 02/11/2012 FINDINGS: CT THORACIC SPINE FINDINGS Alignment: No vertebral subluxation is observed. Thoracic kyphosis noted. Vertebrae: No thoracic spine fracture or acute bony finding. There is multilevel bridging spurring anterior to the thoracic spine at the T2 through T10 levels. Paraspinal and other soft tissues: Please see dedicated CT chest report. Disc levels: No substantial thoracic spine impingement is noted. There is left paracentral posterior intervertebral spurring at the T8-9 level but not causing central narrowing of the thecal sac. CT LUMBAR SPINE FINDINGS Segmentation: The lowest lumbar type non-rib-bearing vertebra is labeled as L5. Alignment: 5 mm of grade 1 degenerative anterolisthesis at L4-5. Vertebrae: Pathologic fracture at L1 with about 65% loss of vertebral body height, with the posterior contour of the vertebral body bulging back 6 mm in a manner highly suspicious for pathologic underlying tumor. The abnormal associated lucency in the vertebral body extends into both pedicles and into the transverse processes (right greater than left, with expansion of the pedicles and right transverse process. There is some mild extension into the right lamina on image 25 series 4. Mild degenerative endplate sclerosis at the L3-4 and L4-5 levels with loss of intervertebral disc height. No other lytic lesions are identified in the lumbar spine. Paraspinal and other soft tissues: Please see dedicated CT abdomen report. Disc levels: T12-L1: Borderline bilateral foraminal stenosis due to tumor expansion of the bilateral pedicles. Cannot exclude right subarticular lateral recess stenosis given the tumor expansion in this vicinity borderline central narrowing of the thecal sac due to posterior bony  retropulsion/bulging. L1-2: No impingement. L2-3: No impingement.  Diffuse disc bulge. L3-4: No impingement.  Right eccentric disc bulge. L4-5: Moderate to prominent right and mild left foraminal stenosis and borderline central narrowing of the thecal sac due to subluxation, disc uncovering, and facet arthropathy. L5-S1: No impingement.  Central disc protrusion. IMPRESSION: 1. L1 pathologic fracture with 65% loss of vertebral body height and posterior bulging of the vertebral body by 0.6 cm. Tumor involvement of the bilateral pedicles, right greater than left transverse process, and right lamina. Bony expansion at this level contributes to borderline impingement at T12-L1. No other osseous metastatic disease in the thoracolumbar spine identified. 2. Moderate to prominent impingement at L4-5 due to subluxation with disc uncovering and facet arthropathy. Electronically Signed   By: Van Clines M.D.   On: 02/10/2022 17:37   CT L-SPINE NO CHARGE  Result Date: 02/10/2022 CLINICAL DATA:  Pathologic fracture at L1 EXAM: CT THORACIC AND LUMBAR SPINE WITHOUT CONTRAST TECHNIQUE: Multidetector CT imaging of the thoracic and lumbar spine was performed without contrast. Multiplanar CT image reconstructions were also generated. RADIATION DOSE REDUCTION: This exam was performed according to the departmental dose-optimization program which includes automated exposure control, adjustment of the mA and/or kV according to patient size and/or use of iterative reconstruction technique. COMPARISON:  MRI lumbar spine 02/11/2012 FINDINGS: CT THORACIC SPINE FINDINGS Alignment: No vertebral subluxation is observed. Thoracic kyphosis noted. Vertebrae: No thoracic spine fracture or acute bony finding. There is multilevel bridging spurring anterior to the thoracic spine at the T2 through T10 levels. Paraspinal and other soft tissues: Please see dedicated CT chest report. Disc levels: No substantial thoracic spine impingement is  noted. There is left paracentral posterior intervertebral spurring at the T8-9 level but not causing central narrowing of the thecal  sac. CT LUMBAR SPINE FINDINGS Segmentation: The lowest lumbar type non-rib-bearing vertebra is labeled as L5. Alignment: 5 mm of grade 1 degenerative anterolisthesis at L4-5. Vertebrae: Pathologic fracture at L1 with about 65% loss of vertebral body height, with the posterior contour of the vertebral body bulging back 6 mm in a manner highly suspicious for pathologic underlying tumor. The abnormal associated lucency in the vertebral body extends into both pedicles and into the transverse processes (right greater than left, with expansion of the pedicles and right transverse process. There is some mild extension into the right lamina on image 25 series 4. Mild degenerative endplate sclerosis at the L3-4 and L4-5 levels with loss of intervertebral disc height. No other lytic lesions are identified in the lumbar spine. Paraspinal and other soft tissues: Please see dedicated CT abdomen report. Disc levels: T12-L1: Borderline bilateral foraminal stenosis due to tumor expansion of the bilateral pedicles. Cannot exclude right subarticular lateral recess stenosis given the tumor expansion in this vicinity borderline central narrowing of the thecal sac due to posterior bony retropulsion/bulging. L1-2: No impingement. L2-3: No impingement.  Diffuse disc bulge. L3-4: No impingement.  Right eccentric disc bulge. L4-5: Moderate to prominent right and mild left foraminal stenosis and borderline central narrowing of the thecal sac due to subluxation, disc uncovering, and facet arthropathy. L5-S1: No impingement.  Central disc protrusion. IMPRESSION: 1. L1 pathologic fracture with 65% loss of vertebral body height and posterior bulging of the vertebral body by 0.6 cm. Tumor involvement of the bilateral pedicles, right greater than left transverse process, and right lamina. Bony expansion at this level  contributes to borderline impingement at T12-L1. No other osseous metastatic disease in the thoracolumbar spine identified. 2. Moderate to prominent impingement at L4-5 due to subluxation with disc uncovering and facet arthropathy. Electronically Signed   By: Van Clines M.D.   On: 02/10/2022 17:37   CT CHEST ABDOMEN PELVIS W CONTRAST  Result Date: 02/10/2022 CLINICAL DATA:  Occult malignancy, staging workup.  Severe pain. * Tracking Code: BO * EXAM: CT CHEST, ABDOMEN, AND PELVIS WITH CONTRAST TECHNIQUE: Multidetector CT imaging of the chest, abdomen and pelvis was performed following the standard protocol during bolus administration of intravenous contrast. RADIATION DOSE REDUCTION: This exam was performed according to the departmental dose-optimization program which includes automated exposure control, adjustment of the mA and/or kV according to patient size and/or use of iterative reconstruction technique. CONTRAST:  151m OMNIPAQUE IOHEXOL 300 MG/ML  SOLN COMPARISON:  Lumbar MRI 02/10/2022 and CT abdomen pelvis 01/02/2013 FINDINGS: CT CHEST FINDINGS Cardiovascular: Coronary, aortic arch, and branch vessel atherosclerotic vascular disease. Mediastinum/Nodes: Centrally necrotic 3.1 by 2.4 by 2.9 cm anterior mediastinal mass in the left prevascular region on image 17 series 2, with mild mass effect on the brachiocephalic vein as shown on image 16 series 2. This is in the immediate vicinity of the phrenic nerve, and the elevation of the left hemidiaphragm is new compared to 08/11/2019 chest radiograph, raising suspicion for phrenic nerve impingement leading to diaphragmatic dysfunction. Left supraclavicular node, 1.0 cm in short axis on image 9 series 2. Right eccentric subcarinal node 1.0 cm in short axis on image 28 series 2. Lungs/Pleura: Scattered scarring and/or atelectasis in the left lower lobe and lingula. This is particularly notable anteriorly in the left lower lobe. Bronchiectasis and volume  loss medially in the right middle lobe for example on image 98 series 3. Scattered tree-in-bud reticulonodular opacities are present in the lungs, right greater than left, characteristic for atypical  infectious bronchiolitis. In light of the pathologic adenopathy in the chest this raises questions as to which of the visualized small nodules might be malignant versus infectious. One of the largest nodules is a 12 by 7 by 11 mm (volume = 500 mm^3) nodule in the right lateral costophrenic angle on image 107 of series 3, with clustered reticulonodular opacities just cephalad to this lesion. Peripheral atelectasis versus mild pleural thickening posteriorly in the left lower lobe. Musculoskeletal: As noted above, the left hemidiaphragm is elevated, and the left anterior mediastinal mass which runs directly in the vicinity of the left phrenic nerve is implicated as a likely cause. Degenerative arthropathy at the sternoclavicular joints. Thoracic spondylosis. CT ABDOMEN PELVIS FINDINGS Hepatobiliary: Fluid density sharply defined 1.4 by 1.0 cm cyst posteriorly in segment 6 of the liver on image 59 series 2, this previously measured 0.6 cm in diameter back in 2014. Gallbladder unremarkable. No other liver lesion identified. Pancreas: Subtle abnormal hypoenhancement inferiorly in the pancreatic body measuring about 1.2 by 0.7 by 1.3 cm on image 38 of series 5. The pancreatic head is also mildly hypoenhancing. This could be inflammatory but infiltrative pancreatic adenocarcinoma cannot be excluded. This warrants detailed workup with either pancreatic protocol CT or MRI with and without contrast. No dorsal pancreatic duct dilatation identified. Spleen: Unremarkable Adrenals/Urinary Tract: Unremarkable Stomach/Bowel: Prominent stool throughout the colon favors constipation. Sigmoid colon diverticulosis is present and there is an inflamed diverticula and inflammatory findings the junction of the descending and sigmoid colon on  image 31 series 5 suggesting mild acute diverticulitis. Tumor in this vicinity with local resulting inflammation is a differential diagnostic consideration given the findings elsewhere in today's examination. Vascular/Lymphatic: Atherosclerosis is present, including aortoiliac atherosclerotic disease. There is atheromatous calcification but only mild stenosis proximally in the superior mesenteric artery, with no findings of occlusion. No pathologic adenopathy observed in the abdomen/pelvis. Reproductive: Unremarkable Other: No supplemental non-categorized findings. Musculoskeletal: As shown on the lumbar spine MRI, there is a pathologic fracture at L1 with diffuse abnormal lysis involving the vertebral body, bilateral pedicles, and bilateral transverse processes at this level with about 65% of vertebral body height and with a posterior bulging contour of the vertebral body by about 5-6 mm. Further detail on dedicated lumbar spine CT. IMPRESSION: 1. 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 There is also mild left supraclavicular adenopathy. 2. Scattered tree-in-bud reticulonodular opacities in the lungs, right greater than left, characteristic for atypical infectious bronchiolitis. One of the largest nodules is a 12 by 7 by 11 mm nodule in the right lateral costophrenic angle, which could be malignant or infectious. 3. Subtle abnormal hypoenhancement inferiorly in the pancreatic body measuring about 1.2 by 0.7 by 1.3 cm, along with some indistinct hypoenhancement in the pancreatic head. This could be inflammatory but infiltrative pancreatic adenocarcinoma cannot be excluded. This warrants detailed workup with either pancreatic protocol CT or MRI with and  without contrast. PET-CT could also be helpful in this case. 4. As shown on lumbar spine MRI, there is a pathologic (malignant) fracture at L1 with diffuse abnormal lysis involving the vertebral body, bilateral pedicles, and bilateral transverse processes at this level with about 65% of vertebral body height and with a posterior bulging contour of the vertebral body by about 5-6 mm.  Further detail on dedicated lumbar spine CT. 5. Mild acute sigmoid colon diverticulitis. Tumor in the vicinity of the local inflammation along the sigmoid colon is not totally excluded but is a less likely differential diagnostic consideration. 6. Aortic atherosclerosis.  Coronary atherosclerosis. Aortic Atherosclerosis (ICD10-I70.0). Electronically Signed   By: Van Clines M.D.   On: 02/10/2022 17:24   MR LUMBAR SPINE WO CONTRAST  Result Date: 02/10/2022 CLINICAL DATA:  Known compression fracture at L1. worsening pain EXAM: MRI LUMBAR SPINE WITHOUT CONTRAST TECHNIQUE: Multiplanar, multisequence MR imaging of the lumbar spine was performed. No intravenous contrast was administered. COMPARISON:  None Available. FINDINGS: Segmentation:  Standard. Alignment: Trace retrolisthesis of L3 on L4. Grade 1 anterolisthesis of L4 on L5. Vertebrae: There is a acute to subacute compression deformity at the L1 vertebral body level with posterior bulging of the cortex. This results in mild spinal canal narrowing at this level. There is diffuse T1 hypointense signal abnormality in the L1 vertebral body level which involve the bilateral pedicles, as well as the bilateral transverse processes (series 9, image 11). Cortex, particularly at the right transverse process is poorly visualized. Conus medullaris and cauda equina: Conus extends to the L1-L2 level. Conus and cauda equina appear normal. 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 There is atrophy of  the paraspinal musculature. No retroperitoneal lymphadenopathy. Bilateral adrenal glands are normal in appearance. Disc levels: T11-T12: No evidence of spinal canal or neural foraminal stenosis. T12-L1: Mild spinal canal stenosis. Mild bilateral neural foraminal stenosis. L1-L2: No spinal canal stenosis. Moderate right neural foraminal stenosis. No left neural foraminal stenosis. L2-L3: Circumferential disc bulge. Mild bilateral facet degenerative change. Mild spinal canal stenosis. No neural foraminal stenosis. L3-L4: Circumferential disc bulge. Mild bilateral facet degenerative change. Moderate left neural foraminal stenosis. No right neural foraminal stenosis. Mild spinal canal narrowing. L4-L5: Grade 1 anterolisthesis. Severe bilateral facet degenerative change. Severe right neural foraminal stenosis. Mild left neural foraminal stenosis. Mild spinal canal narrowing. L5-S1: No spinal canal or neural foraminal stenosis. IMPRESSION: Acute to subacute compression deformity at the L1 vertebral body level with posterior bulging of the cortex resulting in mild spinal canal stenosis at this level. There is diffuse T1 hypointense signal abnormality in the L1 vertebral body with involvement of the bilateral pedicles, as well as the bilateral transverse processes. This raises concerns for a pathologic fracture. Recommend further evaluation with CT of the lumbar spine and correlate with history of malignancy. Electronically Signed   By: Marin Roberts M.D.   On: 02/10/2022 15:57     Assessment and plan- Patient is a 71 y.o. 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  She has an anterior mediastinal mass as well as evidence of left supraclavicular adenopathy and L1 vertebral metastases.Supraclavicular lymph node biopsy is positive for lung adenocarcinoma.  NGS  testing on the peripheral blood was done because there is no adequate tissue for NGS testing.  It does not show any evidence of actionable mutations.  MSI stable.  She is therefore not a candidate for any targeted treatment or single agent immunotherapy.  Patient is currently undergoing palliative radiation to her spine which she will complete on 03/15/2022.  I recommend combination chemotherapy along with immunotherapy with carboplatin AUC 5 along with pemetrexed 500 mg/m and  Keytruda given IV every 3 weeks for 4 cycles.  Will repeat scans again after 4 cycles and if shows stable disease or partial response she will go on to receive maintenance Alimta and Keytruda until progression or toxicity.  Discussed risks and benefits of chemotherapy including all but not limited to nausea, vomiting, low blood counts, risk of infections and hospitalizations.  Discussed autoimmune side effects associated with immunotherapy including autoimmune colitis, pneumonitis, endocrinopathies hepatitis and need to monitor kidney and liver functions.  Treatment will be given with a palliative intent.  Patient would like to think about all this with her family and let me know if she wants to pursue treatment or not.  I will see her back on 03/15/2022 to discuss further.  There would also be a role for bisphosphonates given her bone metastases.  This will be given on a monthly basis.  Discussed risks and benefits of Zometa including but not limited to possible osteonecrosis of the jaw.  We will obtain dental clearance prior.  Treatment will be given with palliative intent.     Cancer Staging  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 site [facility]) Staging form: Lung, AJCC 8th Edition - Clinical stage from 03/03/2022: Stage IV (cT2, cN3, pM1) - Signed by Sindy Guadeloupe, MD on 03/03/2022   Visit Diagnosis 1. 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 site, unspecified laterality ([place])   2. Goals of care,  counseling/discussion      Dr. Randa Evens, MD, MPH [facility] [phone] 03/03/2022 3:08 PM

## 2022-03-03 NOTE — Progress Notes (Signed)
Nutrition  Patient on schedule for telephone visit.  Called and no answer.  Left message with call back number  Nahima Ales B. Zenia Resides, [place], [place] Registered Dietitian [phone]

## 2022-03-04 ENCOUNTER — Encounter: Payer: Self-pay | Admitting: Hospice and Palliative Medicine

## 2022-03-08 ENCOUNTER — Other Ambulatory Visit: Payer: Self-pay

## 2022-03-08 ENCOUNTER — Other Ambulatory Visit: Payer: Self-pay | Admitting: *Deleted

## 2022-03-08 ENCOUNTER — Telehealth: Payer: Self-pay

## 2022-03-08 ENCOUNTER — Ambulatory Visit: Payer: Medicare HMO

## 2022-03-08 ENCOUNTER — Emergency Department: Payer: Medicare HMO

## 2022-03-08 ENCOUNTER — Emergency Department
Admission: EM | Admit: 2022-03-08 | Discharge: 2022-03-09 | Disposition: A | Discharge status: Home / Self Care | Payer: Medicare HMO | Attending: Emergency Medicine | Admitting: Emergency Medicine

## 2022-03-08 DIAGNOSIS — W19XXXA Unspecified fall, initial encounter: Secondary | ICD-10-CM | POA: Diagnosis not present

## 2022-03-08 DIAGNOSIS — I10 Essential (primary) hypertension: Secondary | ICD-10-CM | POA: Insufficient documentation

## 2022-03-08 DIAGNOSIS — Z85118 Personal history of other malignant neoplasm of bronchus and lung: Secondary | ICD-10-CM | POA: Insufficient documentation

## 2022-03-08 DIAGNOSIS — S7002XA Contusion of left hip, initial encounter: Secondary | ICD-10-CM | POA: Diagnosis not present

## 2022-03-08 DIAGNOSIS — E119 Type 2 diabetes mellitus without complications: Secondary | ICD-10-CM | POA: Diagnosis not present

## 2022-03-08 DIAGNOSIS — S79912A Unspecified injury of left hip, initial encounter: Secondary | ICD-10-CM | POA: Diagnosis present

## 2022-03-08 MED ORDER — OXYCODONE HCL 10 MG PO TABS: 10.0000 mg | ORAL_TABLET | ORAL | 0 refills | Status: DC | PRN

## 2022-03-08 MED ORDER — CYCLOBENZAPRINE HCL 5 MG PO TABS: 5.0000 mg | ORAL_TABLET | Freq: Three times a day (TID) | ORAL | 0 refills | Status: DC | PRN

## 2022-03-08 NOTE — Telephone Encounter (Signed)
ED note from today reviewed.   Lumbar xrays dated 03/08/22:  FINDINGS: There is again a moderate L1 vertebral body compression fracture. Moderate approximately 5 mm retropulsion of the posterosuperior aspect of the L 1 vertebral body is unchanged from 02/10/2022 CT and MRI. Interval bilateral transpedicular screws placed at T11, T12, L2, and L3, with bilateral posterior interconnecting rods. Minimal dextrocurvature centered at T12 is unchanged from prior. 6 mm grade 1 anterolisthesis of L4 on L5, unchanged from prior.   Moderate L3-4 and L4-5 and mild T9-10 through T11-12 disc space narrowing.   IMPRESSION: Compared to 02/10/2022:   1. Moderate L1 vertebral body compression fracture, unchanged from 02/10/2022 CT and MRI. 2. Interval bilateral posterior fusion from T11-L3 except for the L1 vertebral body level, treatment for the prior L1 compression fracture. 3. Unchanged grade 1 anterolisthesis of L4 on L5.     Electronically Signed   By: Yvonne Kendall M.D.   On: 03/08/2022 11:45   Hardware looks good with no complications noted.   She will f/u as scheduled with Dr. Izora Ribas.

## 2022-03-08 NOTE — Telephone Encounter (Signed)
Rx sent 

## 2022-03-08 NOTE — ED Triage Notes (Signed)
Has a fall Tuesday night.  On 11/6 had back surg for L1 fx.  Family reports she was find Wednesday Thursday and then has progressively got worse since fridday   Patient having a hard time sitting and complains of left lower back and hip pain.  Patient is ambulatory in triage.

## 2022-03-08 NOTE — ED Provider Notes (Signed)
   [facility] Provider Note    Event Date/Time   First MD Initiated Contact with Patient 03/08/22 1033     (approximate)   History   Fall   HPI  Amber ANASTAS is a 71 y.o. female with history of diabetes, hypertension who presents after a fall.  Patient reports she felt last week, had been doing okay but then developed soreness in her left lower back and left hip.  She recently had fusion with Dr. Cari Caraway of neurosurgery.  She denies neurodeficits.  Complains of soreness primarily.  Is scheduled to start radiation for metastatic lung CA as well.     Physical Exam   Triage Vital Signs: ED Triage Vitals  Enc Vitals Group     BP 03/08/22 1030 (!) 169/57     Pulse Rate 03/08/22 1030 (!) 130     Resp 03/08/22 1030 18     Temp 03/08/22 1030 98.3 F (36.8 C)     Temp Source 03/08/22 1030 Oral     SpO2 03/08/22 1030 95 %     Weight 03/08/22 1028 55.3 kg (122 lb)     Height 03/08/22 1028 1.524 m (5')     Head Circumference --      Peak Flow --      Pain Score 03/08/22 1028 10     Pain Loc --      Pain Edu? --      Excl. in [place]? --     Most recent vital signs: Vitals:   03/08/22 1030 03/08/22 1201  BP: (!) 169/57 (!) 160/60  Pulse: (!) 130 (!) 110  Resp: 18 18  Temp: 98.3 F (36.8 C)   SpO2: 95% 96%     General: Awake, no distress.  CV:  Good peripheral perfusion.  Resp:  Normal effort.  Abd:  No distention.  Other:  Back: No vertebral tenderness, moves lower extremities well no pain with axial load, warm and well-perfused distally.   ED Results / Procedures / Treatments   Labs (all labs ordered are listed, but only abnormal results are displayed) Labs Reviewed - No data to display   EKG     RADIOLOGY Lumbar spine x-ray, pelvis x-ray viewed interpreted by me, no fracture    PROCEDURES:  Critical Care performed:   Procedures   MEDICATIONS ORDERED IN ED: Medications - No data to display   IMPRESSION / MDM /  [place] / ED COURSE  I reviewed the triage vital signs and the nursing notes. Patient's presentation is most consistent with acute illness / injury with system symptoms.  Patient presents after a fall with pain in the primarily left hip area she is ambulating well.  Given recent surgery will x-ray lumbar spine and pelvis  X-rays are reassuring, patient and family reassured by this, appropriate for discharge at this time        FINAL CLINICAL IMPRESSION(S) / ED DIAGNOSES   Final diagnoses:  Fall, initial encounter  Contusion of left hip, initial encounter     Rx / DC Orders   ED Discharge Orders     None        Note:  This document was prepared using Dragon voice recognition software and may include unintentional dictation errors.   Lavonia Drafts, MD 03/08/22 1444

## 2022-03-08 NOTE — Telephone Encounter (Addendum)
Family sent mychart msg to request for RF for oxycodone and flexeril

## 2022-03-08 NOTE — Telephone Encounter (Signed)
Daughter just called back, she is going to take her mom to the ER she is in alot of pain.

## 2022-03-08 NOTE — Telephone Encounter (Signed)
-----   Message from Peggyann Shoals sent at 03/08/2022  8:57 AM EST ----- Regarding: postop fall Contact: [phone] Amber Glenn patient's daughter calling thoracic fusion on 02/15/22// Patient fell on Tuesday 03/02/2022, she was home alone. She did not complain until Friday with left side pain familiar to the pain before surgery. She starts radiation this week. What should she do?

## 2022-03-09 ENCOUNTER — Ambulatory Visit
Admission: RE | Admit: 2022-03-09 | Discharge: 2022-03-09 | Disposition: A | Discharge status: Home / Self Care | Payer: Medicare HMO | Source: Ambulatory Visit | Attending: Radiation Oncology | Admitting: Radiation Oncology

## 2022-03-09 ENCOUNTER — Inpatient Hospital Stay (HOSPITAL_BASED_OUTPATIENT_CLINIC_OR_DEPARTMENT_OTHER): Payer: Medicare HMO | Admitting: Hospice and Palliative Medicine

## 2022-03-09 ENCOUNTER — Ambulatory Visit: Payer: Medicare HMO

## 2022-03-09 DIAGNOSIS — B370 Candidal stomatitis: Secondary | ICD-10-CM | POA: Diagnosis not present

## 2022-03-09 DIAGNOSIS — G893 Neoplasm related pain (acute) (chronic): Secondary | ICD-10-CM | POA: Diagnosis not present

## 2022-03-09 DIAGNOSIS — Z510 Encounter for antineoplastic radiation therapy: Secondary | ICD-10-CM | POA: Diagnosis not present

## 2022-03-09 DIAGNOSIS — C3490 Malignant neoplasm of unspecified part of unspecified bronchus or lung: Secondary | ICD-10-CM | POA: Diagnosis not present

## 2022-03-09 DIAGNOSIS — Z515 Encounter for palliative care: Secondary | ICD-10-CM

## 2022-03-09 MED ORDER — SUCRALFATE 1 G PO TABS: 1.0000 g | ORAL_TABLET | Freq: Three times a day (TID) | ORAL | 0 refills | Status: DC

## 2022-03-09 MED ORDER — OXYCODONE HCL ER 10 MG PO T12A: 10.0000 mg | EXTENDED_RELEASE_TABLET | Freq: Two times a day (BID) | ORAL | 0 refills | Status: DC

## 2022-03-09 MED ORDER — NYSTATIN 100000 UNIT/ML MT SUSP: 5.0000 mL | Freq: Four times a day (QID) | OROMUCOSAL | 0 refills | Status: DC

## 2022-03-09 NOTE — Progress Notes (Signed)
Amber Glenn at [facility] [phone] [phone]   Name: Amber Glenn Date: 03/09/2022 MRN: [id number]  DOB: April 16, 1950  Patient Care Team: Lynnell Jude, MD as PCP - General (Family Medicine) Telford Nab, RN as Oncology Nurse Navigator    REASON FOR CONSULTATION: Amber Glenn is a 71 y.o. female with multiple medical problems including diabetes, hypertension, hyperlipidemia, depression Patient was hospitalized 02/10/2022 to 02/18/2022 with back pain.  MRI of the lumbar spine revealed an acute pathologic compression fracture of L1 and patient ultimately required percutaneous fixation by neurosurgery.  CT of the chest, abdomen, and pelvis revealed a centrally necrotic anterior mediastinal mass with phrenic nerve impingement and diaphragmatic dysfunction, scattered bilateral lung opacities, hypoenhancement in the inferior pancreatic head/body.  Patient underwent lymph node biopsy positive for adenocarcinoma of the lung.  Patient's hospitalization was complicated by pain and delirium.  Palliative care was consulted to help address goals and manage ongoing symptoms.   SOCIAL HISTORY:     reports that she quit smoking about 13 years ago. Her smoking use included cigarettes. She has never used smokeless tobacco. She reports that she does not drink alcohol and does not use drugs.  Patient is widowed.  She lives at home with her daughter and grandchildren.  Patient has two daughters, both of whom are nurses.  Patient retired as a Regulatory affairs officer.   ADVANCE DIRECTIVES:  Not on file  CODE STATUS:   PAST MEDICAL HISTORY: Past Medical History:  Diagnosis Date   Arthritis    hands and feet   Cataract    right   Complication of anesthesia    sometimes have a hard time waking up   Diabetes mellitus    Borderline Diabetes   GERD (gastroesophageal reflux disease)    HTN (hypertension)    Hyperlipidemia    Motion sickness     Post corneal transplant    right    PAST SURGICAL HISTORY:  Past Surgical History:  Procedure Laterality Date   APPLICATION OF INTRAOPERATIVE CT SCAN  02/15/2022   Procedure: APPLICATION OF INTRAOPERATIVE CT SCAN;  Surgeon: Meade Maw, MD;  Location: [facility] ORS;  Service: Neurosurgery;;   BASAL CELL CARCINOMA EXCISION     Dr. Manley Mason   CARPAL TUNNEL RELEASE     right   COLONOSCOPY WITH PROPOFOL N/A 12/03/2015   Procedure: COLONOSCOPY WITH PROPOFOL;  Surgeon: Manya Silvas, MD;  Location: [facility] ENDOSCOPY;  Service: Endoscopy;  Laterality: N/A;   COLONOSCOPY WITH PROPOFOL N/A 02/22/2020   Procedure: COLONOSCOPY WITH PROPOFOL;  Surgeon: Lesly Rubenstein, MD;  Location: [facility] ENDOSCOPY;  Service: Endoscopy;  Laterality: N/A;   ESOPHAGOGASTRODUODENOSCOPY (EGD) WITH PROPOFOL N/A 02/22/2020   Procedure: ESOPHAGOGASTRODUODENOSCOPY (EGD) WITH PROPOFOL;  Surgeon: Lesly Rubenstein, MD;  Location: [facility] ENDOSCOPY;  Service: Endoscopy;  Laterality: N/A;   HAMMER TOE SURGERY Right 05/23/2019   Procedure: HAMMER TOE CORRECTION;  Surgeon: Samara Deist, DPM;  Location: [place];  Service: Podiatry;  Laterality: Right;  Diabetic   OSTECTOMY Right 05/23/2019   Procedure: DOUBLE OSTEOTOMY RIGHT;  Surgeon: Samara Deist, DPM;  Location: [place];  Service: Podiatry;  Laterality: Right;   SKIN CANCER EXCISION      HEMATOLOGY/ONCOLOGY HISTORY:  Oncology History  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 site [facility])  03/03/2022 Initial Diagnosis   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 site [facility])   03/03/2022 Cancer Staging   Staging form: Lung, AJCC 8th  Edition - Clinical stage from 03/03/2022: Stage IV (cT2, cN3, pM1) - Signed by Sindy Guadeloupe, MD on 03/03/2022     ALLERGIES:  is allergic to tramadol.  MEDICATIONS:  Current Outpatient Medications  Medication Sig Dispense Refill   nystatin (MYCOSTATIN) 100000 UNIT/ML suspension Use as  directed 5 mLs (500,000 Units total) in the mouth or throat 4 (four) times daily. 200 mL 0   oxyCODONE (OXYCONTIN) 10 mg 12 hr tablet Take 1 tablet (10 mg total) by mouth every 12 (twelve) hours. 30 tablet 0   sucralfate (CARAFATE) 1 g tablet Take 1 tablet (1 g total) by mouth 4 (four) times daily -  with meals and at bedtime. 60 tablet 0   acetaminophen (TYLENOL) 500 MG tablet Take 500 mg by mouth every 6 (six) hours as needed for moderate pain, mild pain, fever or headache.     Ascorbic Acid (VITAMIN C) 1000 MG tablet Take 1,000 mg by mouth daily.       carvedilol (COREG) 3.125 MG tablet Take 3.125 mg by mouth 2 (two) times daily with a meal.     celecoxib (CELEBREX) 200 MG capsule Take 1 capsule (200 mg total) by mouth 2 (two) times daily. 60 capsule 0   cyclobenzaprine (FLEXERIL) 5 MG tablet Take 1 tablet (5 mg total) by mouth 3 (three) times daily as needed for muscle spasms. 30 tablet 0   Docusate Calcium (STOOL SOFTENER PO) Take 1 capsule by mouth daily as needed (constipation).     Ferrous Sulfate (IRON) 325 (65 Fe) MG TABS Take 1 tablet by mouth daily at 12 noon.     gabapentin (NEURONTIN) 100 MG capsule Take 1 capsule (100 mg total) by mouth 3 (three) times daily. 270 capsule 0   losartan (COZAAR) 100 MG tablet Take 100 mg by mouth daily.     melatonin 5 MG TABS Take 1 tablet (5 mg total) by mouth at bedtime as needed. 90 tablet 0   metFORMIN (GLUCOPHAGE) 500 MG tablet TAKE 1 TABLET TWICE DAILY WITH A MEAL 180 tablet 1   Multiple Vitamin (MULTIVITAMIN) capsule Take 1 capsule by mouth daily.       omeprazole (PRILOSEC) 20 MG capsule Take 1 capsule (20 mg total) by mouth daily. 90 capsule 3   Oxycodone HCl 10 MG TABS Take 1 tablet (10 mg total) by mouth every 4 (four) hours as needed (pain). 60 tablet 0   polyethylene glycol (MIRALAX / GLYCOLAX) 17 g packet Take 17 g by mouth daily. 14 each 0   prednisoLONE acetate (PRED MILD) 0.12 % ophthalmic suspension Place 1 drop into the right eye  daily.     simvastatin (ZOCOR) 40 MG tablet TAKE 1 TABLET EVERY DAY  AT  6:00 PM 90 tablet 3   No current facility-administered medications for this visit.    VITAL SIGNS: There were no vitals taken for this visit. There were no vitals filed for this visit.   Estimated body mass index is 23.83 kg/m as calculated from the following:   Height as of 03/08/22: 5' (1.524 m).   Weight as of 03/08/22: 122 lb (55.3 kg).  LABS: CBC:    Component Value Date/Time   WBC 13.7 (H) 02/24/2022 1040   HGB 11.4 (L) 02/24/2022 1040   HCT 37.5 02/24/2022 1040   PLT 333 02/24/2022 1040   MCV 80.0 02/24/2022 1040   NEUTROABS 10.8 (H) 02/24/2022 1040   LYMPHSABS 1.4 02/24/2022 1040   MONOABS 1.0 02/24/2022 1040   EOSABS  0.3 02/24/2022 1040   BASOSABS 0.1 02/24/2022 1040   Comprehensive Metabolic Panel:    Component Value Date/Time   NA 132 (L) 02/24/2022 1040   K 4.3 02/24/2022 1040   CL 98 02/24/2022 1040   CO2 24 02/24/2022 1040   BUN 13 02/24/2022 1040   CREATININE 0.58 02/24/2022 1040   GLUCOSE 141 (H) 02/24/2022 1040   CALCIUM 9.7 02/24/2022 1040   AST 20 02/24/2022 1040   ALT 15 02/24/2022 1040   ALKPHOS 94 02/24/2022 1040   BILITOT 0.4 02/24/2022 1040   PROT 6.8 02/24/2022 1040   ALBUMIN 3.4 (L) 02/24/2022 1040    RADIOGRAPHIC STUDIES: DG Pelvis 1-2 Views  Result Date: 03/08/2022 CLINICAL DATA:  Fall 6 days ago.  Left lower back and hip pain. EXAM: PELVIS - 1-2 VIEW COMPARISON:  CT abdomen and pelvis 01/02/2013 FINDINGS: Mildly decreased bone mineralization. The bilateral sacroiliac, femoroacetabular, and pubic symphysis joint spaces are maintained. No acute fracture is seen. No dislocation. Moderate right L4-5 disc space narrowing. IMPRESSION: 1. No acute fracture is seen. 2. Moderate right L4-5 disc space narrowing. Electronically Signed   By: Yvonne Kendall M.D.   On: 03/08/2022 11:46   DG Lumbar Spine 2-3 Views  Result Date: 03/08/2022 CLINICAL DATA:  Fall Tuesday night  03/02/2022. Left lower back and hip pain. Back surgery 02/15/2022 for L1 fracture. EXAM: LUMBAR SPINE - 2-3 VIEW COMPARISON:  CT lumbar spine 02/10/2022, MRI lumbar spine 02/10/2022 FINDINGS: There is again a moderate L1 vertebral body compression fracture. Moderate approximately 5 mm retropulsion of the posterosuperior aspect of the L 1 vertebral body is unchanged from 02/10/2022 CT and MRI. Interval bilateral transpedicular screws placed at T11, T12, L2, and L3, with bilateral posterior interconnecting rods. Minimal dextrocurvature centered at T12 is unchanged from prior. 6 mm grade 1 anterolisthesis of L4 on L5, unchanged from prior. Moderate L3-4 and L4-5 and mild T9-10 through T11-12 disc space narrowing. IMPRESSION: Compared to 02/10/2022: 1. Moderate L1 vertebral body compression fracture, unchanged from 02/10/2022 CT and MRI. 2. Interval bilateral posterior fusion from T11-L3 except for the L1 vertebral body level, treatment for the prior L1 compression fracture. 3. Unchanged grade 1 anterolisthesis of L4 on L5. Electronically Signed   By: Yvonne Kendall M.D.   On: 03/08/2022 11:45   MR BRAIN W WO CONTRAST  Result Date: 02/16/2022 CLINICAL DATA:  Metastatic disease evaluation EXAM: MRI HEAD WITHOUT AND WITH CONTRAST TECHNIQUE: Multiplanar, multiecho pulse sequences of the brain and surrounding structures were obtained without and with intravenous contrast. CONTRAST:  56mL GADAVIST GADOBUTROL 1 MMOL/ML IV SOLN COMPARISON:  01/11/2021 FINDINGS: Brain: No restricted diffusion to suggest acute or subacute infarct. No abnormal parenchymal or meningeal enhancement. No acute hemorrhage, mass, mass effect, or midline shift. No hydrocephalus or extra-axial collection. 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 Scattered T2 hyperintense signal in the periventricular white matter, likely the sequela of mild-to-moderate chronic small vessel ischemic disease. Vascular: Normal arterial flow voids. Skull and upper  cervical spine: Normal marrow signal. Sinuses/Orbits: Clear paranasal sinuses. Status post bilateral lens replacements. No acute finding in the orbits. Other: Trace fluid in the mastoid air cells. IMPRESSION: No acute intracranial process. No evidence of metastatic disease in the brain. Electronically Signed   By: Merilyn Baba M.D.   On: 02/16/2022 23:45   DG Lumbar Spine 2-3 Views  Result Date: 02/15/2022 CLINICAL DATA:  Fracture L1, fluoroscopic assistance for lumbar fusion EXAM: LUMBAR SPINE - 2-3 VIEW COMPARISON:  02/01/2022 FINDINGS:  Fluoroscopic images show posterior surgical fusion from T11-L3 levels. Compression fracture is seen in the body of L1 vertebra. Fluoroscopic time 64 seconds. Radiation dose 43.6 mGy. IMPRESSION: Fluoroscopic assistance was provided for surgical fusion from T11-L3 levels. Electronically Signed   By: Elmer Picker M.D.   On: 02/15/2022 15:36   DG C-Arm 1-60 Min-No Report  Result Date: 02/15/2022 Fluoroscopy was utilized by the requesting physician.  No radiographic interpretation.   DG C-Arm 1-60 Min-No Report  Result Date: 02/15/2022 Fluoroscopy was utilized by the requesting physician.  No radiographic interpretation.   [place] CORE BIOPSY (LYMPH NODES)  Result Date: 02/11/2022 INDICATION: Mediastinal mass, enlarged left supraclavicular lymph node, L1 vertebral body lesion and possible subtle pancreatic mass. EXAM: ULTRASOUND GUIDED CORE BIOPSY OF LEFT SUPRACLAVICULAR LYMPH NODE MEDICATIONS: None. ANESTHESIA/SEDATION: None. The patient was given 25 mcg of IV fentanyl during the procedure. Formal moderate conscious sedation could not be given due to lack of NPO status. PROCEDURE: The procedure, risks, benefits, and alternatives were explained to the patient. Questions regarding the procedure were encouraged and answered. The patient understands and consents to the procedure. A time-out was performed prior to initiating the procedure. Ultrasound was used to localize  a left supraclavicular lymph node to correlate with CT findings. The left neck was prepped with chlorhexidine in a sterile fashion, and a sterile drape was applied covering the operative field. A sterile gown and sterile gloves were used for the procedure. Local anesthesia was provided with 1% Lidocaine. Under ultrasound guidance, 18 gauge core biopsy samples were obtained of a left supraclavicular lymph node. Material was submitted in formalin. Additional ultrasound was performed. COMPLICATIONS: None immediate. FINDINGS: Hypoechoic, abnormal appearing lymph node is identified just lateral to the left internal jugular vein measuring approximately 1.2 x 1.0 x 1.2 cm. Solid core biopsy samples were obtained. IMPRESSION: Ultrasound-guided core biopsy performed of a 1.2 cm left supraclavicular lymph node. Electronically Signed   By: Aletta Edouard M.D.   On: 02/11/2022 16:15   CT T-SPINE NO CHARGE  Result Date: 02/10/2022 CLINICAL DATA:  Pathologic fracture at L1 EXAM: CT THORACIC AND LUMBAR SPINE WITHOUT CONTRAST TECHNIQUE: Multidetector CT imaging of the thoracic and lumbar spine was performed without contrast. Multiplanar CT image reconstructions were also generated. RADIATION DOSE REDUCTION: This exam was performed according to the departmental dose-optimization program which includes automated exposure control, adjustment of the mA and/or kV according to patient size and/or use of iterative reconstruction technique. COMPARISON:  MRI lumbar spine 02/11/2012 FINDINGS: CT THORACIC SPINE FINDINGS Alignment: No vertebral subluxation is observed. Thoracic kyphosis noted. Vertebrae: No thoracic spine fracture or acute bony finding. There is multilevel bridging spurring anterior to the thoracic spine at the T2 through T10 levels. Paraspinal and other soft tissues: Please see dedicated CT chest report. Disc levels: No substantial thoracic spine impingement is noted. There is left paracentral posterior intervertebral  spurring at the T8-9 level but not causing central narrowing of the thecal sac. CT LUMBAR SPINE FINDINGS Segmentation: The lowest lumbar type non-rib-bearing vertebra is labeled as L5. Alignment: 5 mm of grade 1 degenerative anterolisthesis at L4-5. Vertebrae: Pathologic fracture at L1 with about 65% loss of vertebral body height, with the posterior contour of the vertebral body bulging back 6 mm in a manner highly suspicious for pathologic underlying tumor. The abnormal associated lucency in the vertebral body extends into both pedicles and into the transverse processes (right greater than left, with expansion of the pedicles and right transverse process. There is some mild  extension into the right lamina on image 25 series 4. Mild degenerative endplate sclerosis at the L3-4 and L4-5 levels with loss of intervertebral disc height. No other lytic lesions are identified in the lumbar spine. Paraspinal and other soft tissues: Please see dedicated CT abdomen report. Disc levels: T12-L1: Borderline bilateral foraminal stenosis due to tumor expansion of the bilateral pedicles. Cannot exclude right subarticular lateral recess stenosis given the tumor expansion in this vicinity borderline central narrowing of the thecal sac due to posterior bony retropulsion/bulging. L1-2: No impingement. L2-3: No impingement.  Diffuse disc bulge. L3-4: No impingement.  Right eccentric disc bulge. L4-5: Moderate to prominent right and mild left foraminal stenosis and borderline central narrowing of the thecal sac due to subluxation, disc uncovering, and facet arthropathy. L5-S1: No impingement.  Central disc protrusion. IMPRESSION: 1. L1 pathologic fracture with 65% loss of vertebral body height and posterior bulging of the vertebral body by 0.6 cm. Tumor involvement of the bilateral pedicles, right greater than left transverse process, and right lamina. Bony expansion at this level contributes to borderline impingement at T12-L1. No other  osseous metastatic disease in the thoracolumbar spine identified. 2. Moderate to prominent impingement at L4-5 due to subluxation with disc uncovering and facet arthropathy. Electronically Signed   By: Van Clines M.D.   On: 02/10/2022 17:37   CT L-SPINE NO CHARGE  Result Date: 02/10/2022 CLINICAL DATA:  Pathologic fracture at L1 EXAM: CT THORACIC AND LUMBAR SPINE WITHOUT CONTRAST TECHNIQUE: Multidetector CT imaging of the thoracic and lumbar spine was performed without contrast. Multiplanar CT image reconstructions were also generated. RADIATION DOSE REDUCTION: This exam was performed according to the departmental dose-optimization program which includes automated exposure control, adjustment of the mA and/or kV according to patient size and/or use of iterative reconstruction technique. COMPARISON:  MRI lumbar spine 02/11/2012 FINDINGS: CT THORACIC SPINE FINDINGS Alignment: No vertebral subluxation is observed. Thoracic kyphosis noted. Vertebrae: No thoracic spine fracture or acute bony finding. There is multilevel bridging spurring anterior to the thoracic spine at the T2 through T10 levels. Paraspinal and other soft tissues: Please see dedicated CT chest report. Disc levels: No substantial thoracic spine impingement is noted. There is left paracentral posterior intervertebral spurring at the T8-9 level but not causing central narrowing of the thecal sac. CT LUMBAR SPINE FINDINGS Segmentation: The lowest lumbar type non-rib-bearing vertebra is labeled as L5. Alignment: 5 mm of grade 1 degenerative anterolisthesis at L4-5. Vertebrae: Pathologic fracture at L1 with about 65% loss of vertebral body height, with the posterior contour of the vertebral body bulging back 6 mm in a manner highly suspicious for pathologic underlying tumor. The abnormal associated lucency in the vertebral body extends into both pedicles and into the transverse processes (right greater than left, with expansion of the pedicles and  right transverse process. There is some mild extension into the right lamina on image 25 series 4. Mild degenerative endplate sclerosis at the L3-4 and L4-5 levels with loss of intervertebral disc height. No other lytic lesions are identified in the lumbar spine. Paraspinal and other soft tissues: Please see dedicated CT abdomen report. Disc levels: T12-L1: Borderline bilateral foraminal stenosis due to tumor expansion of the bilateral pedicles. Cannot exclude right subarticular lateral recess stenosis given the tumor expansion in this vicinity borderline central narrowing of the thecal sac due to posterior bony retropulsion/bulging. L1-2: No impingement. L2-3: No impingement.  Diffuse disc bulge. L3-4: No impingement.  Right eccentric disc bulge. L4-5: Moderate to prominent right and mild  left foraminal stenosis and borderline central narrowing of the thecal sac due to subluxation, disc uncovering, and facet arthropathy. L5-S1: No impingement.  Central disc protrusion. IMPRESSION: 1. L1 pathologic fracture with 65% loss of vertebral body height and posterior bulging of the vertebral body by 0.6 cm. Tumor involvement of the bilateral pedicles, right greater than left transverse process, and right lamina. Bony expansion at this level contributes to borderline impingement at T12-L1. No other osseous metastatic disease in the thoracolumbar spine identified. 2. Moderate to prominent impingement at L4-5 due to subluxation with disc uncovering and facet arthropathy. Electronically Signed   By: Van Clines M.D.   On: 02/10/2022 17:37   CT CHEST ABDOMEN PELVIS W CONTRAST  Result Date: 02/10/2022 CLINICAL DATA:  Occult malignancy, staging workup.  Severe pain. * Tracking Code: BO * EXAM: CT CHEST, ABDOMEN, AND PELVIS WITH CONTRAST TECHNIQUE: Multidetector CT imaging of the chest, abdomen and pelvis was performed following the standard protocol during bolus administration of intravenous contrast. RADIATION DOSE  REDUCTION: This exam was performed according to the departmental dose-optimization program which includes automated exposure control, adjustment of the mA and/or kV according to patient size and/or use of iterative reconstruction technique. CONTRAST:  151mL OMNIPAQUE IOHEXOL 300 MG/ML  SOLN COMPARISON:  Lumbar MRI 02/10/2022 and CT abdomen pelvis 01/02/2013 FINDINGS: CT CHEST FINDINGS Cardiovascular: Coronary, aortic arch, and branch vessel atherosclerotic vascular disease. Mediastinum/Nodes: Centrally necrotic 3.1 by 2.4 by 2.9 cm anterior mediastinal mass in the left prevascular region on image 17 series 2, with mild mass effect on the brachiocephalic vein as shown on image 16 series 2. This is in the immediate vicinity of the phrenic nerve, and the elevation of the left hemidiaphragm is new compared to 08/11/2019 chest radiograph, raising suspicion for phrenic nerve impingement leading to diaphragmatic dysfunction. Left supraclavicular node, 1.0 cm in short axis on image 9 series 2. Right eccentric subcarinal node 1.0 cm in short axis on image 28 series 2. Lungs/Pleura: Scattered scarring and/or atelectasis in the left lower lobe and lingula. This is particularly notable anteriorly in the left lower lobe. Bronchiectasis and volume loss medially in the right middle lobe for example on image 98 series 3. Scattered tree-in-bud reticulonodular opacities are present in the lungs, right greater than left, characteristic for atypical infectious bronchiolitis. In light of the pathologic adenopathy in the chest this raises questions as to which of the visualized small nodules might be malignant versus infectious. One of the largest nodules is a 12 by 7 by 11 mm (volume = 500 mm^3) nodule in the right lateral costophrenic angle on image 107 of series 3, with clustered reticulonodular opacities just cephalad to this lesion. Peripheral atelectasis versus mild pleural thickening posteriorly in the left lower lobe.  Musculoskeletal: As noted above, the left hemidiaphragm is elevated, and the left anterior mediastinal mass which runs directly in the vicinity of the left phrenic nerve is implicated as a likely cause. Degenerative arthropathy at the sternoclavicular joints. Thoracic spondylosis. CT ABDOMEN PELVIS FINDINGS Hepatobiliary: Fluid density sharply defined 1.4 by 1.0 cm cyst posteriorly in segment 6 of the liver on image 59 series 2, this previously measured 0.6 cm in diameter back in 2014. Gallbladder unremarkable. No other liver lesion identified. Pancreas: Subtle abnormal hypoenhancement inferiorly in the pancreatic body measuring about 1.2 by 0.7 by 1.3 cm on image 38 of series 5. The pancreatic head is also mildly hypoenhancing. This could be inflammatory but infiltrative pancreatic adenocarcinoma cannot be excluded. This warrants detailed workup with  either pancreatic protocol CT or MRI with and without contrast. No dorsal pancreatic duct dilatation identified. Spleen: Unremarkable Adrenals/Urinary Tract: Unremarkable Stomach/Bowel: Prominent stool throughout the colon favors constipation. Sigmoid colon diverticulosis is present and there is an inflamed diverticula and inflammatory findings the junction of the descending and sigmoid colon on image 31 series 5 suggesting mild acute diverticulitis. Tumor in this vicinity with local resulting inflammation is a differential diagnostic consideration given the findings elsewhere in today's examination. Vascular/Lymphatic: Atherosclerosis is present, including aortoiliac atherosclerotic disease. There is atheromatous calcification but only mild stenosis proximally in the superior mesenteric artery, with no findings of occlusion. No pathologic adenopathy observed in the abdomen/pelvis. Reproductive: Unremarkable Other: No supplemental non-categorized findings. Musculoskeletal: As shown on the lumbar spine MRI, there is a pathologic fracture at L1 with diffuse abnormal  lysis involving the vertebral body, bilateral pedicles, and bilateral transverse processes at this level with about 65% of vertebral body height and with a posterior bulging contour of the vertebral body by about 5-6 mm. Further detail on dedicated lumbar spine CT. IMPRESSION: 1. 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 There is also mild left supraclavicular adenopathy. 2. Scattered tree-in-bud reticulonodular opacities in the lungs, right greater than left, characteristic for atypical infectious bronchiolitis. One of the largest nodules is a 12 by 7 by 11 mm nodule in the right lateral costophrenic angle, which could be malignant or infectious. 3. Subtle abnormal hypoenhancement inferiorly in the pancreatic body measuring about 1.2 by 0.7 by 1.3 cm, along with some indistinct hypoenhancement in the pancreatic head. This could be inflammatory but infiltrative pancreatic adenocarcinoma cannot be excluded. This warrants detailed workup with either pancreatic protocol CT or MRI with and without contrast. PET-CT could also be helpful in this case. 4. As shown on lumbar spine MRI, there is a pathologic (malignant) fracture at L1 with diffuse abnormal lysis involving the vertebral body, bilateral pedicles, and bilateral transverse processes at this level with about 65% of vertebral body height and with a posterior bulging contour of the vertebral body by about 5-6 mm. Further detail on dedicated lumbar spine CT. 5. Mild acute sigmoid colon diverticulitis. Tumor in the vicinity of the local inflammation along the sigmoid colon is not totally excluded but is a less likely differential diagnostic consideration. 6. Aortic atherosclerosis.  Coronary  atherosclerosis. Aortic Atherosclerosis (ICD10-I70.0). Electronically Signed   By: Van Clines M.D.   On: 02/10/2022 17:24   MR LUMBAR SPINE WO CONTRAST  Result Date: 02/10/2022 CLINICAL DATA:  Known compression fracture at L1. worsening pain EXAM: MRI LUMBAR SPINE WITHOUT CONTRAST TECHNIQUE: Multiplanar, multisequence MR imaging of the lumbar spine was performed. No intravenous contrast was administered. COMPARISON:  None Available. FINDINGS: Segmentation:  Standard. Alignment: Trace retrolisthesis of L3 on L4. Grade 1 anterolisthesis of L4 on L5. Vertebrae: There is a acute to subacute compression deformity at the L1 vertebral body level with posterior bulging of the cortex. This results in mild spinal canal narrowing at this level. There is diffuse T1 hypointense signal abnormality in the L1 vertebral body level which involve the bilateral pedicles, as well as the bilateral transverse processes (series 9, image 11). Cortex, particularly at the right transverse process is poorly visualized. Conus medullaris and cauda equina: Conus extends to the L1-L2 level. Conus  and cauda equina appear normal. 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 There is atrophy of the paraspinal musculature. No retroperitoneal lymphadenopathy. Bilateral adrenal glands are normal in appearance. Disc levels: T11-T12: No evidence of spinal canal or neural foraminal stenosis. T12-L1: Mild spinal canal stenosis. Mild bilateral neural foraminal stenosis. L1-L2: No spinal canal stenosis. Moderate right neural foraminal stenosis. No left neural foraminal stenosis. L2-L3: Circumferential disc bulge. Mild bilateral facet degenerative change. Mild spinal canal stenosis. No neural foraminal stenosis. L3-L4: Circumferential disc bulge. Mild bilateral facet degenerative change. Moderate left neural foraminal stenosis. No right neural foraminal  stenosis. Mild spinal canal narrowing. L4-L5: Grade 1 anterolisthesis. Severe bilateral facet degenerative change. Severe right neural foraminal stenosis. Mild left neural foraminal stenosis. Mild spinal canal narrowing. L5-S1: No spinal canal or neural foraminal stenosis. IMPRESSION: Acute to subacute compression deformity at the L1 vertebral body level with posterior bulging of the cortex resulting in mild spinal canal stenosis at this level. There is diffuse T1 hypointense signal abnormality in the L1 vertebral body with involvement of the bilateral pedicles, as well as the bilateral transverse processes. This raises concerns for a pathologic fracture. Recommend further evaluation with CT of the lumbar spine and correlate with history of malignancy. Electronically Signed   By: Marin Roberts M.D.   On: 02/10/2022 15:57    PERFORMANCE STATUS (ECOG) : 1 - Symptomatic but completely ambulatory  Review of Systems Unless otherwise noted, a complete review of systems is negative.  Physical Exam General: NAD HEENT: white patches on tongue Cardiovascular: regular rate and rhythm Pulmonary: clear ant fields Abdomen: soft, nontender, + bowel sounds GU: no suprapubic tenderness Extremities: no edema, no joint deformities Skin: no rashes Neurological: Weakness but otherwise nonfocal  IMPRESSION: Patient was an add-on to clinic today due to persistent back pain.  She was seen in radiation oncology.  Patient was seen yesterday in the ED for pain in left hip and back following a fall.  X-rays of the lumbar spine and pelvis did not show any evidence of acute fracture.  Patient reports that she is finding some relief with oxycodone every 4 hours but finds it short-lived.  Discussed the option of starting her on a long-acting opioid to try to better control pain around-the-clock and that she and daughter were in agreement.  Will start OxyContin.  Patient can continue oxycodone IR as needed for breakthrough  pain.  Patient also reports that she has had a sore throat and feels like she has some difficulty swallowing.  Does appear that she has thrush on the tongue.  Will start her on nystatin.  PLAN: -Continue current scope of treatment -Start OxyContin 10 mg every 12 hours -Continue oxycodone IR 10 mg every 4 hours as needed for breakthrough pain -Daily bowel regimen -Start nystatin oral suspension -RTC next week to see Dr. Janese Banks and will have follow-up telephone visit with me the following week  Case and plan discussed with Dr. Baruch Gouty   Patient expressed understanding and was in agreement with this plan. She also understands that She can call the clinic at any time with any questions, concerns, or complaints.     Time Total: 15 minutes  Visit consisted of counseling and education dealing with the complex and emotionally intense issues of symptom management and palliative care in the setting of serious and potentially life-threatening illness.Greater than 50%  of this time was spent counseling and coordinating  care related to the above assessment and plan.  Signed by: Altha Harm, PhD, NP-C

## 2022-03-10 ENCOUNTER — Other Ambulatory Visit: Payer: Self-pay

## 2022-03-10 ENCOUNTER — Telehealth: Payer: Self-pay | Admitting: *Deleted

## 2022-03-10 ENCOUNTER — Telehealth: Payer: Medicare HMO | Admitting: Hospice and Palliative Medicine

## 2022-03-10 ENCOUNTER — Other Ambulatory Visit: Payer: Self-pay | Admitting: Student in an Organized Health Care Education/Training Program

## 2022-03-10 ENCOUNTER — Other Ambulatory Visit: Payer: Self-pay | Admitting: *Deleted

## 2022-03-10 ENCOUNTER — Ambulatory Visit
Admission: RE | Admit: 2022-03-10 | Discharge: 2022-03-10 | Disposition: A | Discharge status: Home / Self Care | Payer: Medicare HMO | Source: Ambulatory Visit | Attending: Radiation Oncology | Admitting: Radiation Oncology

## 2022-03-10 DIAGNOSIS — Z510 Encounter for antineoplastic radiation therapy: Secondary | ICD-10-CM | POA: Diagnosis not present

## 2022-03-10 LAB — RAD ONC ARIA SESSION SUMMARY
Course Elapsed Days: 0
Plan Fractions Treated to Date: 1
Plan Prescribed Dose Per Fraction: 4 Gy
Plan Total Fractions Prescribed: 5
Plan Total Prescribed Dose: 20 Gy
Reference Point Dosage Given to Date: 4 Gy
Reference Point Session Dosage Given: 4 Gy
Session Number: 1

## 2022-03-10 MED ORDER — XTAMPZA ER 9 MG PO C12A: 9.0000 mg | EXTENDED_RELEASE_CAPSULE | Freq: Two times a day (BID) | ORAL | 0 refills | Status: DC

## 2022-03-10 MED ORDER — OXYCODONE HCL 20 MG PO TABS: 10.0000 mg | ORAL_TABLET | ORAL | 0 refills | Status: DC | PRN

## 2022-03-10 NOTE — Telephone Encounter (Signed)
Hayley reached out and new Rx for Ginger Organ was sent to pharmacy.

## 2022-03-10 NOTE — Telephone Encounter (Signed)
Per [facility] on [address] , patient's oxycontin ER 10 mg tablet is not a preferred drug (not covered by insurance). The pharmacy is requesting alernative request- either Morphine ER 30 mg tablet or Xtampza ER 9 mg tablet.  Josh- please advise.

## 2022-03-11 ENCOUNTER — Other Ambulatory Visit: Payer: Self-pay

## 2022-03-11 ENCOUNTER — Ambulatory Visit
Admission: RE | Admit: 2022-03-11 | Discharge: 2022-03-11 | Disposition: A | Discharge status: Home / Self Care | Payer: Medicare HMO | Source: Ambulatory Visit | Attending: Radiation Oncology | Admitting: Radiation Oncology

## 2022-03-11 ENCOUNTER — Telehealth: Payer: Self-pay | Admitting: *Deleted

## 2022-03-11 DIAGNOSIS — Z510 Encounter for antineoplastic radiation therapy: Secondary | ICD-10-CM | POA: Diagnosis not present

## 2022-03-11 LAB — RAD ONC ARIA SESSION SUMMARY
Course Elapsed Days: 1
Plan Fractions Treated to Date: 2
Plan Prescribed Dose Per Fraction: 4 Gy
Plan Total Fractions Prescribed: 5
Plan Total Prescribed Dose: 20 Gy
Reference Point Dosage Given to Date: 8 Gy
Reference Point Session Dosage Given: 4 Gy
Session Number: 2

## 2022-03-11 MED ORDER — PROCHLORPERAZINE MALEATE 10 MG PO TABS: 10.0000 mg | ORAL_TABLET | Freq: Four times a day (QID) | ORAL | 2 refills | Status: DC | PRN

## 2022-03-11 MED ORDER — ONDANSETRON HCL 8 MG PO TABS: 8.0000 mg | ORAL_TABLET | Freq: Three times a day (TID) | ORAL | 2 refills | Status: DC | PRN

## 2022-03-11 NOTE — Telephone Encounter (Signed)
Per pt's daughter, pt tried xtampza and felt nauseated after taking it even though she had a little bit of food in her stomach. Per Merrily Pew, will send in compazine and zofran for pt to try one before taking xtampza to help with nausea. Pt's daughter made aware.

## 2022-03-12 ENCOUNTER — Ambulatory Visit
Admission: RE | Admit: 2022-03-12 | Discharge: 2022-03-12 | Disposition: A | Discharge status: Home / Self Care | Payer: Medicare HMO | Source: Ambulatory Visit | Attending: Radiation Oncology | Admitting: Radiation Oncology

## 2022-03-12 ENCOUNTER — Encounter: Payer: Self-pay | Admitting: *Deleted

## 2022-03-12 ENCOUNTER — Other Ambulatory Visit: Payer: Self-pay

## 2022-03-12 DIAGNOSIS — Z803 Family history of malignant neoplasm of breast: Secondary | ICD-10-CM | POA: Insufficient documentation

## 2022-03-12 DIAGNOSIS — B370 Candidal stomatitis: Secondary | ICD-10-CM | POA: Insufficient documentation

## 2022-03-12 DIAGNOSIS — C7951 Secondary malignant neoplasm of bone: Secondary | ICD-10-CM | POA: Insufficient documentation

## 2022-03-12 DIAGNOSIS — C3490 Malignant neoplasm of unspecified part of unspecified bronchus or lung: Secondary | ICD-10-CM | POA: Insufficient documentation

## 2022-03-12 DIAGNOSIS — J029 Acute pharyngitis, unspecified: Secondary | ICD-10-CM | POA: Insufficient documentation

## 2022-03-12 DIAGNOSIS — M549 Dorsalgia, unspecified: Secondary | ICD-10-CM | POA: Insufficient documentation

## 2022-03-12 DIAGNOSIS — E1136 Type 2 diabetes mellitus with diabetic cataract: Secondary | ICD-10-CM | POA: Insufficient documentation

## 2022-03-12 DIAGNOSIS — G893 Neoplasm related pain (acute) (chronic): Secondary | ICD-10-CM | POA: Insufficient documentation

## 2022-03-12 DIAGNOSIS — Z87891 Personal history of nicotine dependence: Secondary | ICD-10-CM | POA: Insufficient documentation

## 2022-03-12 DIAGNOSIS — Z510 Encounter for antineoplastic radiation therapy: Secondary | ICD-10-CM | POA: Insufficient documentation

## 2022-03-12 DIAGNOSIS — C3492 Malignant neoplasm of unspecified part of left bronchus or lung: Secondary | ICD-10-CM | POA: Diagnosis present

## 2022-03-12 DIAGNOSIS — R1310 Dysphagia, unspecified: Secondary | ICD-10-CM | POA: Insufficient documentation

## 2022-03-12 DIAGNOSIS — C770 Secondary and unspecified malignant neoplasm of lymph nodes of head, face and neck: Secondary | ICD-10-CM | POA: Insufficient documentation

## 2022-03-12 DIAGNOSIS — I10 Essential (primary) hypertension: Secondary | ICD-10-CM | POA: Insufficient documentation

## 2022-03-12 DIAGNOSIS — M4856XA Collapsed vertebra, not elsewhere classified, lumbar region, initial encounter for fracture: Secondary | ICD-10-CM | POA: Insufficient documentation

## 2022-03-12 LAB — RAD ONC ARIA SESSION SUMMARY
Course Elapsed Days: 2
Plan Fractions Treated to Date: 3
Plan Prescribed Dose Per Fraction: 4 Gy
Plan Total Fractions Prescribed: 5
Plan Total Prescribed Dose: 20 Gy
Reference Point Dosage Given to Date: 12 Gy
Reference Point Session Dosage Given: 4 Gy
Session Number: 3

## 2022-03-12 NOTE — Progress Notes (Signed)
Received call from pt's daughter, [name], to inform that pt's pain is not any better today after starting xtampza ER 9mg . Per Merrily Pew, pt needs to continue taking xtampza and to give it a few more days to work in her system. In the interim, Josh recommended that she can increase her oxycodone IR to 20mg  if needed to help with pain management. Informed pt's duaghter of recommendations and informed that will follow up with pt on Monday to re-evaluate pain management. [name] verbalized understanding. Nothing further needed at this time.

## 2022-03-14 ENCOUNTER — Other Ambulatory Visit: Payer: Self-pay | Admitting: *Deleted

## 2022-03-15 ENCOUNTER — Ambulatory Visit
Admission: RE | Admit: 2022-03-15 | Discharge: 2022-03-15 | Disposition: A | Discharge status: Home / Self Care | Payer: Medicare HMO | Source: Ambulatory Visit | Attending: Radiation Oncology | Admitting: Radiation Oncology

## 2022-03-15 ENCOUNTER — Ambulatory Visit: Payer: Medicare HMO

## 2022-03-15 ENCOUNTER — Telehealth: Payer: Self-pay | Admitting: *Deleted

## 2022-03-15 ENCOUNTER — Inpatient Hospital Stay (HOSPITAL_BASED_OUTPATIENT_CLINIC_OR_DEPARTMENT_OTHER): Payer: Medicare HMO | Admitting: Hospice and Palliative Medicine

## 2022-03-15 ENCOUNTER — Encounter: Payer: Self-pay | Admitting: *Deleted

## 2022-03-15 ENCOUNTER — Inpatient Hospital Stay: Payer: Medicare HMO | Attending: Hospice and Palliative Medicine | Admitting: Oncology

## 2022-03-15 ENCOUNTER — Other Ambulatory Visit: Payer: Self-pay | Admitting: Oncology

## 2022-03-15 ENCOUNTER — Encounter: Payer: Self-pay | Admitting: Oncology

## 2022-03-15 ENCOUNTER — Other Ambulatory Visit: Payer: Self-pay

## 2022-03-15 VITALS — BP 124/72 | HR 142 | Resp 16 | Wt 119.7 lb

## 2022-03-15 DIAGNOSIS — G893 Neoplasm related pain (acute) (chronic): Secondary | ICD-10-CM | POA: Diagnosis not present

## 2022-03-15 DIAGNOSIS — C3492 Malignant neoplasm of unspecified part of left bronchus or lung: Secondary | ICD-10-CM | POA: Insufficient documentation

## 2022-03-15 DIAGNOSIS — C3490 Malignant neoplasm of unspecified part of unspecified bronchus or lung: Secondary | ICD-10-CM | POA: Diagnosis not present

## 2022-03-15 DIAGNOSIS — Z803 Family history of malignant neoplasm of breast: Secondary | ICD-10-CM | POA: Insufficient documentation

## 2022-03-15 DIAGNOSIS — R1310 Dysphagia, unspecified: Secondary | ICD-10-CM | POA: Diagnosis not present

## 2022-03-15 DIAGNOSIS — Z515 Encounter for palliative care: Secondary | ICD-10-CM | POA: Diagnosis not present

## 2022-03-15 DIAGNOSIS — C7951 Secondary malignant neoplasm of bone: Secondary | ICD-10-CM | POA: Insufficient documentation

## 2022-03-15 DIAGNOSIS — Z7189 Other specified counseling: Secondary | ICD-10-CM | POA: Diagnosis not present

## 2022-03-15 DIAGNOSIS — Z87891 Personal history of nicotine dependence: Secondary | ICD-10-CM | POA: Insufficient documentation

## 2022-03-15 DIAGNOSIS — I10 Essential (primary) hypertension: Secondary | ICD-10-CM | POA: Insufficient documentation

## 2022-03-15 DIAGNOSIS — Z510 Encounter for antineoplastic radiation therapy: Secondary | ICD-10-CM | POA: Diagnosis not present

## 2022-03-15 DIAGNOSIS — E1136 Type 2 diabetes mellitus with diabetic cataract: Secondary | ICD-10-CM | POA: Insufficient documentation

## 2022-03-15 LAB — RAD ONC ARIA SESSION SUMMARY
Course Elapsed Days: 5
Plan Fractions Treated to Date: 4
Plan Prescribed Dose Per Fraction: 4 Gy
Plan Total Fractions Prescribed: 5
Plan Total Prescribed Dose: 20 Gy
Reference Point Dosage Given to Date: 16 Gy
Reference Point Session Dosage Given: 4 Gy
Session Number: 4

## 2022-03-15 MED ORDER — OXYCODONE HCL 20 MG PO TABS: 10.0000 mg | ORAL_TABLET | ORAL | 0 refills | Status: DC | PRN

## 2022-03-15 MED ORDER — PREDNISONE 20 MG PO TABS: ORAL_TABLET | ORAL | 0 refills | Status: DC

## 2022-03-15 MED ORDER — XTAMPZA ER 18 MG PO C12A: 1.0000 | EXTENDED_RELEASE_CAPSULE | Freq: Two times a day (BID) | ORAL | 0 refills | Status: DC

## 2022-03-15 NOTE — Progress Notes (Addendum)
[place] at [facility] [phone] [phone]) [phone]   Name: Amber Glenn Date: 03/15/2022 MRN: [id number]  DOB: 1950/12/03  Patient Care Team: Lynnell Jude, MD as PCP - General (Family Medicine) Telford Nab, RN as Oncology Nurse Navigator    REASON FOR CONSULTATION: Amber Glenn is a 71 y.o. female with multiple medical problems including diabetes, hypertension, hyperlipidemia, depression Patient was hospitalized 02/10/2022 to 02/18/2022 with back pain.  MRI of the lumbar spine revealed an acute pathologic compression fracture of L1 and patient ultimately required percutaneous fixation by neurosurgery.  CT of the chest, abdomen, and pelvis revealed a centrally necrotic anterior mediastinal mass with phrenic nerve impingement and diaphragmatic dysfunction, scattered bilateral lung opacities, hypoenhancement in the inferior pancreatic head/body.  Patient underwent lymph node biopsy positive for adenocarcinoma of the lung.  Patient's hospitalization was complicated by pain and delirium.  Palliative care was consulted to help address goals and manage ongoing symptoms.   SOCIAL HISTORY:     reports that she quit smoking about 13 years ago. Her smoking use included cigarettes. She has never used smokeless tobacco. She reports that she does not drink alcohol and does not use drugs.  Patient is widowed.  She lives at home with her daughter and grandchildren.  Patient has two daughters, both of whom are nurses.  Patient retired as a Regulatory affairs officer.   ADVANCE DIRECTIVES:  Not on file  CODE STATUS:   PAST MEDICAL HISTORY: Past Medical History:  Diagnosis Date   Arthritis    hands and feet   Cataract    right   Complication of anesthesia    sometimes have a hard time waking up   Diabetes mellitus    Borderline Diabetes   GERD (gastroesophageal reflux disease)    HTN (hypertension)    Hyperlipidemia    Motion sickness     Post corneal transplant    right    PAST SURGICAL HISTORY:  Past Surgical History:  Procedure Laterality Date   APPLICATION OF INTRAOPERATIVE CT SCAN  02/15/2022   Procedure: APPLICATION OF INTRAOPERATIVE CT SCAN;  Surgeon: Meade Maw, MD;  Location: [facility] ORS;  Service: Neurosurgery;;   BASAL CELL CARCINOMA EXCISION     Dr. Manley Mason   CARPAL TUNNEL RELEASE     right   COLONOSCOPY WITH PROPOFOL N/A 12/03/2015   Procedure: COLONOSCOPY WITH PROPOFOL;  Surgeon: Manya Silvas, MD;  Location: [facility] ENDOSCOPY;  Service: Endoscopy;  Laterality: N/A;   COLONOSCOPY WITH PROPOFOL N/A 02/22/2020   Procedure: COLONOSCOPY WITH PROPOFOL;  Surgeon: Lesly Rubenstein, MD;  Location: [facility] ENDOSCOPY;  Service: Endoscopy;  Laterality: N/A;   ESOPHAGOGASTRODUODENOSCOPY (EGD) WITH PROPOFOL N/A 02/22/2020   Procedure: ESOPHAGOGASTRODUODENOSCOPY (EGD) WITH PROPOFOL;  Surgeon: Lesly Rubenstein, MD;  Location: [facility] ENDOSCOPY;  Service: Endoscopy;  Laterality: N/A;   HAMMER TOE SURGERY Right 05/23/2019   Procedure: HAMMER TOE CORRECTION;  Surgeon: Samara Deist, DPM;  Location: [place];  Service: Podiatry;  Laterality: Right;  Diabetic   OSTECTOMY Right 05/23/2019   Procedure: DOUBLE OSTEOTOMY RIGHT;  Surgeon: Samara Deist, DPM;  Location: [place];  Service: Podiatry;  Laterality: Right;   SKIN CANCER EXCISION      HEMATOLOGY/ONCOLOGY HISTORY:  Oncology History  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 site [facility])  03/03/2022 Initial Diagnosis   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 site [facility])   03/03/2022 Cancer Staging   Staging form: Lung, AJCC 8th  Edition - Clinical stage from 03/03/2022: Stage IV (cT2, cN3, pM1) - Signed by Sindy Guadeloupe, MD on 03/03/2022     ALLERGIES:  is allergic to tramadol.  MEDICATIONS:  Current Outpatient Medications  Medication Sig Dispense Refill   acetaminophen (TYLENOL) 500 MG tablet Take 500 mg by  mouth every 6 (six) hours as needed for moderate pain, mild pain, fever or headache.     Ascorbic Acid (VITAMIN C) 1000 MG tablet Take 1,000 mg by mouth daily.       carvedilol (COREG) 3.125 MG tablet Take 3.125 mg by mouth 2 (two) times daily with a meal.     celecoxib (CELEBREX) 200 MG capsule Take 1 capsule (200 mg total) by mouth 2 (two) times daily. 60 capsule 0   cyclobenzaprine (FLEXERIL) 5 MG tablet Take 1 tablet (5 mg total) by mouth 3 (three) times daily as needed for muscle spasms. 30 tablet 0   Docusate Calcium (STOOL SOFTENER PO) Take 1 capsule by mouth daily as needed (constipation).     Ferrous Sulfate (IRON) 325 (65 Fe) MG TABS Take 1 tablet by mouth daily at 12 noon.     gabapentin (NEURONTIN) 100 MG capsule Take 1 capsule (100 mg total) by mouth 3 (three) times daily. 270 capsule 0   losartan (COZAAR) 100 MG tablet Take 100 mg by mouth daily.     melatonin 5 MG TABS Take 1 tablet (5 mg total) by mouth at bedtime as needed. 90 tablet 0   metFORMIN (GLUCOPHAGE) 500 MG tablet TAKE 1 TABLET TWICE DAILY WITH A MEAL 180 tablet 1   Multiple Vitamin (MULTIVITAMIN) capsule Take 1 capsule by mouth daily.       nystatin (MYCOSTATIN) 100000 UNIT/ML suspension Use as directed 5 mLs (500,000 Units total) in the mouth or throat 4 (four) times daily. 200 mL 0   omeprazole (PRILOSEC) 20 MG capsule Take 1 capsule (20 mg total) by mouth daily. 90 capsule 3   ondansetron (ZOFRAN) 8 MG tablet Take 1 tablet (8 mg total) by mouth every 8 (eight) hours as needed for nausea or vomiting. 20 tablet 2   oxyCODONE ER (XTAMPZA ER) 9 MG C12A Take 9 mg by mouth every 12 (twelve) hours. 30 capsule 0   Oxycodone HCl 20 MG TABS Take 0.5 tablets (10 mg total) by mouth every 4 (four) hours as needed. 30 tablet 0   polyethylene glycol (MIRALAX / GLYCOLAX) 17 g packet Take 17 g by mouth daily. 14 each 0   prednisoLONE acetate (PRED MILD) 0.12 % ophthalmic suspension Place 1 drop into the right eye daily.      prochlorperazine (COMPAZINE) 10 MG tablet Take 1 tablet (10 mg total) by mouth every 6 (six) hours as needed for nausea or vomiting. 30 tablet 2   simvastatin (ZOCOR) 40 MG tablet TAKE 1 TABLET EVERY DAY  AT  6:00 PM 90 tablet 3   sucralfate (CARAFATE) 1 g tablet Take 1 tablet (1 g total) by mouth 4 (four) times daily -  with meals and at bedtime. 60 tablet 0   No current facility-administered medications for this visit.    VITAL SIGNS: There were no vitals taken for this visit. There were no vitals filed for this visit.   Estimated body mass index is 23.38 kg/m as calculated from the following:   Height as of 03/08/22: 5' (1.524 m).   Weight as of an earlier encounter on 03/15/22: 119 lb 11.2 oz (54.3 kg).  LABS: CBC:  Component Value Date/Time   WBC 13.7 (H) 02/24/2022 1040   HGB 11.4 (L) 02/24/2022 1040   HCT 37.5 02/24/2022 1040   PLT 333 02/24/2022 1040   MCV 80.0 02/24/2022 1040   NEUTROABS 10.8 (H) 02/24/2022 1040   LYMPHSABS 1.4 02/24/2022 1040   MONOABS 1.0 02/24/2022 1040   EOSABS 0.3 02/24/2022 1040   BASOSABS 0.1 02/24/2022 1040   Comprehensive Metabolic Panel:    Component Value Date/Time   NA 132 (L) 02/24/2022 1040   K 4.3 02/24/2022 1040   CL 98 02/24/2022 1040   CO2 24 02/24/2022 1040   BUN 13 02/24/2022 1040   CREATININE 0.58 02/24/2022 1040   GLUCOSE 141 (H) 02/24/2022 1040   CALCIUM 9.7 02/24/2022 1040   AST 20 02/24/2022 1040   ALT 15 02/24/2022 1040   ALKPHOS 94 02/24/2022 1040   BILITOT 0.4 02/24/2022 1040   PROT 6.8 02/24/2022 1040   ALBUMIN 3.4 (L) 02/24/2022 1040    RADIOGRAPHIC STUDIES: DG Pelvis 1-2 Views  Result Date: 03/08/2022 CLINICAL DATA:  Fall 6 days ago.  Left lower back and hip pain. EXAM: PELVIS - 1-2 VIEW COMPARISON:  CT abdomen and pelvis 01/02/2013 FINDINGS: Mildly decreased bone mineralization. The bilateral sacroiliac, femoroacetabular, and pubic symphysis joint spaces are maintained. No acute fracture is seen. No  dislocation. Moderate right L4-5 disc space narrowing. IMPRESSION: 1. No acute fracture is seen. 2. Moderate right L4-5 disc space narrowing. Electronically Signed   By: Yvonne Kendall M.D.   On: 03/08/2022 11:46   DG Lumbar Spine 2-3 Views  Result Date: 03/08/2022 CLINICAL DATA:  Fall Tuesday night 03/02/2022. Left lower back and hip pain. Back surgery 02/15/2022 for L1 fracture. EXAM: LUMBAR SPINE - 2-3 VIEW COMPARISON:  CT lumbar spine 02/10/2022, MRI lumbar spine 02/10/2022 FINDINGS: There is again a moderate L1 vertebral body compression fracture. Moderate approximately 5 mm retropulsion of the posterosuperior aspect of the L 1 vertebral body is unchanged from 02/10/2022 CT and MRI. Interval bilateral transpedicular screws placed at T11, T12, L2, and L3, with bilateral posterior interconnecting rods. Minimal dextrocurvature centered at T12 is unchanged from prior. 6 mm grade 1 anterolisthesis of L4 on L5, unchanged from prior. Moderate L3-4 and L4-5 and mild T9-10 through T11-12 disc space narrowing. IMPRESSION: Compared to 02/10/2022: 1. Moderate L1 vertebral body compression fracture, unchanged from 02/10/2022 CT and MRI. 2. Interval bilateral posterior fusion from T11-L3 except for the L1 vertebral body level, treatment for the prior L1 compression fracture. 3. Unchanged grade 1 anterolisthesis of L4 on L5. Electronically Signed   By: Yvonne Kendall M.D.   On: 03/08/2022 11:45   MR BRAIN W WO CONTRAST  Result Date: 02/16/2022 CLINICAL DATA:  Metastatic disease evaluation EXAM: MRI HEAD WITHOUT AND WITH CONTRAST TECHNIQUE: Multiplanar, multiecho pulse sequences of the brain and surrounding structures were obtained without and with intravenous contrast. CONTRAST:  58mL GADAVIST GADOBUTROL 1 MMOL/ML IV SOLN COMPARISON:  01/11/2021 FINDINGS: Brain: No restricted diffusion to suggest acute or subacute infarct. No abnormal parenchymal or meningeal enhancement. No acute hemorrhage, mass, mass effect, or  midline shift. No hydrocephalus or extra-axial collection. 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 Scattered T2 hyperintense signal in the periventricular white matter, likely the sequela of mild-to-moderate chronic small vessel ischemic disease. Vascular: Normal arterial flow voids. Skull and upper cervical spine: Normal marrow signal. Sinuses/Orbits: Clear paranasal sinuses. Status post bilateral lens replacements. No acute finding in the orbits. Other: Trace fluid in the mastoid air cells. IMPRESSION:  No acute intracranial process. No evidence of metastatic disease in the brain. Electronically Signed   By: Merilyn Baba M.D.   On: 02/16/2022 23:45   DG Lumbar Spine 2-3 Views  Result Date: 02/15/2022 CLINICAL DATA:  Fracture L1, fluoroscopic assistance for lumbar fusion EXAM: LUMBAR SPINE - 2-3 VIEW COMPARISON:  02/01/2022 FINDINGS: Fluoroscopic images show posterior surgical fusion from T11-L3 levels. Compression fracture is seen in the body of L1 vertebra. Fluoroscopic time 64 seconds. Radiation dose 43.6 mGy. IMPRESSION: Fluoroscopic assistance was provided for surgical fusion from T11-L3 levels. Electronically Signed   By: Elmer Picker M.D.   On: 02/15/2022 15:36   DG C-Arm 1-60 Min-No Report  Result Date: 02/15/2022 Fluoroscopy was utilized by the requesting physician.  No radiographic interpretation.   DG C-Arm 1-60 Min-No Report  Result Date: 02/15/2022 Fluoroscopy was utilized by the requesting physician.  No radiographic interpretation.    PERFORMANCE STATUS (ECOG) : 1 - Symptomatic but completely ambulatory  Review of Systems Unless otherwise noted, a complete review of systems is negative.  Physical Exam General: NAD Pulmonary: unlabored Abdomen: soft, nontender, + bowel sounds GU: no suprapubic tenderness Extremities: no edema, no joint deformities Skin: surgical incision on back without redness, masses, drainage Neurological: Weakness but otherwise  nonfocal  IMPRESSION: Follow-up visit.  Patient was also seen today by Dr. Janese Banks.  Patient remains in pain without significant improvement on XRT.  Patient does not feel pain has improved after starting on Xtampza ER or liberalized dosing of oxycodone.  Daughter reports that she is now giving patient oxycodone 20 mg about every 4 hours while awake.  Pain is positional and improved when she is flat but worsens with movement.  Patient has scheduled follow-up with neurosurgery in 2 weeks.  I question if she would benefit from RFA.  Will reach out to Dr. Izora Ribas.   Will plan to dose escalate Xtampza ER to 18 mg every 12 hours.  Continue oxycodone IR as needed for breakthrough pain.  Will start patient on prednisone taper.  Consider dose increase of gabapentin if needed.  Patient is requested holding off on chemotherapy at this point until pain is better controlled.  Patient will let me know when she wants to pursue systemic treatment and this will be communicated to Dr. Janese Banks for scheduling.  PLAN: -Continue current scope of treatment -Increase Xtampza ER to 18 mg every 12 hours, #30 -Continue oxycodone IR 10-20 mg every 4 hours as needed for breakthrough pain, #60 -Start prednisone taper over next 2 weeks -Continue gabapentin with plan for dose escalation if needed -Consider RFA -Daily bowel regimen -Follow-up telephone visit next week  Case and plan discussed with Dr. Janese Banks  Addendum: Discussed with Dr. Izora Ribas who agreed with exploring option of RFA due to persistent back pain.  I spoke with IR physician who suggested noncontrasted lumbar CT to determine feasibility.  Will order.   Patient expressed understanding and was in agreement with this plan. She also understands that She can call the clinic at any time with any questions, concerns, or complaints.     Time Total: 20 minutes  Visit consisted of counseling and education dealing with the complex and emotionally intense issues of  symptom management and palliative care in the setting of serious and potentially life-threatening illness.Greater than 50%  of this time was spent counseling and coordinating care related to the above assessment and plan.  Signed by: Altha Harm, PhD, NP-C

## 2022-03-15 NOTE — Telephone Encounter (Signed)
FMLA form received and completed and sent for physician signature

## 2022-03-15 NOTE — Telephone Encounter (Signed)
I called [phone] and got the fax number to send the letter to get clearance for pt to get zometa. Faxed it to [phone] and fax transmission went through

## 2022-03-15 NOTE — Progress Notes (Signed)
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  She smoked about 2 packs of cigarettes per day but quit smoking in 2010.  She presented to the ER withSymptoms of significant back pain MRIs lumbar spine without contrast showed pathologic fracture of the L1 vertebral body with posterior bulging of the cortex resulting in mild spinal canal stenosis.  Diffuse T1 hypointense abnormality in the L1 vertebral body with involvement of bilateral pedicles and transverse processes.  CT chest abdomen and pelvis with contrast showed 1 cm left supraclavicular adenopathy.  1 cm right subcarinal lymph node.  Necrotic 3.1 x 2.4 x 2.9 cm anterior mediastinal mass in the left prevascular region with mild mass effect on the brachiocephalic vein.  Possible phrenic nerve involvement.  Mild acute sigmoid colon diverticulitis.    Patient had biopsy of the left supraclavicular lymph node which was consistent with adenocarcinoma of lung primary.  NGS testing did not show any actionable mutations.  K-ras G12 D mutation detected.  MSI stable.  Patient underwent percutaneous fixation of the L1 vertebral body.  Patient completed palliative radiation to her spine.  Interval history-currently patient is on as needed oxycodone 20 mg along with OxyContin 10 mg twice daily.  States that her back pain is not presently well-controlled with this regimen.  She continues to have occasional difficulty swallowing food and requires applesauce to swallow her  pills.  ECOG PS- 2 Pain scale- 6 Opioid associated constipation- no  Review of systems- Review of Systems  Constitutional:  Positive for malaise/fatigue. Negative for chills, fever and weight loss.  HENT:  Negative for congestion, ear discharge and nosebleeds.   Eyes:  Negative for blurred vision.  Respiratory:  Negative for cough, hemoptysis, sputum production, shortness of breath and wheezing.   Cardiovascular:  Negative for chest pain, palpitations, orthopnea and claudication.  Gastrointestinal:  Negative for abdominal pain, blood in stool, constipation, diarrhea, heartburn, melena, nausea and vomiting.  Genitourinary:  Negative for dysuria, flank pain, frequency, hematuria and urgency.  Musculoskeletal:  Positive for back pain. Negative for joint pain and myalgias.  Skin:  Negative for rash.  Neurological:  Negative for dizziness, tingling, focal weakness, seizures, weakness and headaches.  Endo/Heme/Allergies:  Does not bruise/bleed easily.  Psychiatric/Behavioral:  Negative for depression and suicidal ideas. The patient does not have insomnia.       Allergies  Allergen Reactions   Tramadol      Past Medical History:  Diagnosis Date   Arthritis    hands and feet   Cataract    right   Complication of anesthesia    sometimes have a hard time waking up   Diabetes mellitus    Borderline Diabetes   GERD (gastroesophageal reflux disease)    HTN (hypertension)    Hyperlipidemia    Motion sickness    Post corneal transplant    right     Past Surgical History:  Procedure Laterality Date   APPLICATION OF INTRAOPERATIVE CT SCAN  02/15/2022   Procedure: APPLICATION OF INTRAOPERATIVE CT SCAN;  Surgeon: Izora Ribas,  Ardyth Gal, MD;  Location: [facility] ORS;  Service: Neurosurgery;;   BASAL CELL CARCINOMA EXCISION     Dr. Manley Mason   CARPAL TUNNEL RELEASE     right   COLONOSCOPY WITH PROPOFOL N/A 12/03/2015   Procedure: COLONOSCOPY WITH PROPOFOL;  Surgeon: Manya Silvas, MD;   Location: [facility];  Service: Endoscopy;  Laterality: N/A;   COLONOSCOPY WITH PROPOFOL N/A 02/22/2020   Procedure: COLONOSCOPY WITH PROPOFOL;  Surgeon: Lesly Rubenstein, MD;  Location: [facility] ENDOSCOPY;  Service: Endoscopy;  Laterality: N/A;   ESOPHAGOGASTRODUODENOSCOPY (EGD) WITH PROPOFOL N/A 02/22/2020   Procedure: ESOPHAGOGASTRODUODENOSCOPY (EGD) WITH PROPOFOL;  Surgeon: Lesly Rubenstein, MD;  Location: [facility] ENDOSCOPY;  Service: Endoscopy;  Laterality: N/A;   HAMMER TOE SURGERY Right 05/23/2019   Procedure: HAMMER TOE CORRECTION;  Surgeon: Samara Deist, DPM;  Location: [place];  Service: Podiatry;  Laterality: Right;  Diabetic   OSTECTOMY Right 05/23/2019   Procedure: DOUBLE OSTEOTOMY RIGHT;  Surgeon: Samara Deist, DPM;  Location: [place];  Service: Podiatry;  Laterality: Right;   SKIN CANCER EXCISION      Social History   Socioeconomic History   Marital status: Widowed    Spouse name: Not on file   Number of children: Not on file   Years of education: Not on file   Highest education level: Not on file  Occupational History    Employer: [employer]    Comment: Currently not employed  Tobacco Use   Smoking status: Former    Types: Cigarettes    Quit date: 01/13/2009    Years since quitting: 13.1   Smokeless tobacco: Never  Substance and Sexual Activity   Alcohol use: No   Drug use: No   Sexual activity: Yes    Birth control/protection: Post-menopausal  Other Topics Concern   Not on file  Social History Narrative   Not on file   Social Determinants of Health   Financial Resource Strain: Not on file  Food Insecurity: No Food Insecurity (02/10/2022)   Hunger Vital Sign    Worried About Running Out of Food in the Last Year: Never true    Ran Out of Food in the Last Year: Never true  Transportation Needs: No Transportation Needs (02/10/2022)   PRAPARE - Hydrologist (Medical): No    Lack of Transportation  (Non-Medical): No  Physical Activity: Not on file  Stress: Not on file  Social Connections: Not on file  Intimate Partner Violence: Not At Risk (02/10/2022)   Humiliation, Afraid, Rape, and Kick questionnaire    Fear of Current or Ex-Partner: No    Emotionally Abused: No    Physically Abused: No    Sexually Abused: No    Family History  Problem Relation Age of Onset   Heart disease Mother    Heart disease Father    Heart disease Maternal Grandmother    Heart disease Maternal Grandfather    Breast cancer Sister 36     Current Outpatient Medications:    acetaminophen (TYLENOL) 500 MG tablet, Take 500 mg by mouth every 6 (six) hours as needed for moderate pain, mild pain, fever or headache., Disp: , Rfl:    Ascorbic Acid (VITAMIN C) 1000 MG tablet, Take 1,000 mg by mouth daily.  , Disp: , Rfl:    carvedilol (COREG) 3.125 MG tablet, Take 3.125 mg by mouth 2 (two) times daily with a meal., Disp: , Rfl:    cyclobenzaprine (FLEXERIL) 5 MG tablet, Take 1  tablet (5 mg total) by mouth 3 (three) times daily as needed for muscle spasms., Disp: 30 tablet, Rfl: 0   Docusate Calcium (STOOL SOFTENER PO), Take 1 capsule by mouth daily as needed (constipation)., Disp: , Rfl:    Ferrous Sulfate (IRON) 325 (65 Fe) MG TABS, Take 1 tablet by mouth daily at 12 noon., Disp: , Rfl:    gabapentin (NEURONTIN) 100 MG capsule, Take 1 capsule (100 mg total) by mouth 3 (three) times daily., Disp: 270 capsule, Rfl: 0   losartan (COZAAR) 100 MG tablet, Take 100 mg by mouth daily., Disp: , Rfl:    melatonin 5 MG TABS, Take 1 tablet (5 mg total) by mouth at bedtime as needed., Disp: 90 tablet, Rfl: 0   metFORMIN (GLUCOPHAGE) 500 MG tablet, TAKE 1 TABLET TWICE DAILY WITH A MEAL, Disp: 180 tablet, Rfl: 1   Multiple Vitamin (MULTIVITAMIN) capsule, Take 1 capsule by mouth daily.  , Disp: , Rfl:    nystatin (MYCOSTATIN) 100000 UNIT/ML suspension, Use as directed 5 mLs (500,000 Units total) in the mouth or throat 4 (four)  times daily., Disp: 200 mL, Rfl: 0   omeprazole (PRILOSEC) 20 MG capsule, Take 1 capsule (20 mg total) by mouth daily., Disp: 90 capsule, Rfl: 3   ondansetron (ZOFRAN) 8 MG tablet, Take 1 tablet (8 mg total) by mouth every 8 (eight) hours as needed for nausea or vomiting., Disp: 20 tablet, Rfl: 2   polyethylene glycol (MIRALAX / GLYCOLAX) 17 g packet, Take 17 g by mouth daily., Disp: 14 each, Rfl: 0   prednisoLONE acetate (PRED MILD) 0.12 % ophthalmic suspension, Place 1 drop into the right eye daily., Disp: , Rfl:    prochlorperazine (COMPAZINE) 10 MG tablet, Take 1 tablet (10 mg total) by mouth every 6 (six) hours as needed for nausea or vomiting., Disp: 30 tablet, Rfl: 2   simvastatin (ZOCOR) 40 MG tablet, TAKE 1 TABLET EVERY DAY  AT  6:00 PM, Disp: 90 tablet, Rfl: 3   sucralfate (CARAFATE) 1 g tablet, Take 1 tablet (1 g total) by mouth 4 (four) times daily -  with meals and at bedtime., Disp: 60 tablet, Rfl: 0   oxyCODONE ER (XTAMPZA ER) 18 MG C12A, Take 1 capsule by mouth every 12 (twelve) hours., Disp: 30 capsule, Rfl: 0   Oxycodone HCl 20 MG TABS, Take 0.5-1 tablets (10-20 mg total) by mouth every 4 (four) hours as needed., Disp: 60 tablet, Rfl: 0   predniSONE (DELTASONE) 20 MG tablet, Take 2 tablets (79m) x 3 days, then take 1 tablet (265m x 3 days, then take 1/2 tablet (1076mx 1 week, then stop, Disp: 20 tablet, Rfl: 0  Physical exam:  Vitals:   03/15/22 1118  BP: 124/72  Pulse: (!) 142  Resp: 16  SpO2: 97%  Weight: 119 lb 11.2 oz (54.3 kg)   Physical Exam Constitutional:      General: She is not in acute distress.    Comments: Appears fatigued  Cardiovascular:     Rate and Rhythm: Normal rate and regular rhythm.     Heart sounds: Normal heart sounds.  Pulmonary:     Effort: Pulmonary effort is normal.     Breath sounds: Normal breath sounds.  Skin:    General: Skin is warm and dry.  Neurological:     Mental Status: She is alert and oriented to person, place, and time.          Latest Ref Rng & Units 02/24/2022  10:40 AM  CMP  Glucose 70 - 99 mg/dL 141   BUN 8 - 23 mg/dL 13   Creatinine 0.44 - 1.00 mg/dL 0.58   Sodium 135 - 145 mmol/L 132   Potassium 3.5 - 5.1 mmol/L 4.3   Chloride 98 - 111 mmol/L 98   CO2 22 - 32 mmol/L 24   Calcium 8.9 - 10.3 mg/dL 9.7   Total Protein 6.5 - 8.1 g/dL 6.8   Total Bilirubin 0.3 - 1.2 mg/dL 0.4   Alkaline Phos 38 - 126 U/L 94   AST 15 - 41 U/L 20   ALT 0 - 44 U/L 15       Latest Ref Rng & Units 02/24/2022   10:40 AM  CBC  WBC 4.0 - 10.5 K/uL 13.7   Hemoglobin 12.0 - 15.0 g/dL 11.4   Hematocrit 36.0 - 46.0 % 37.5   Platelets 150 - 400 K/uL 333     No images are attached to the encounter.  DG Pelvis 1-2 Views  Result Date: 03/08/2022 CLINICAL DATA:  Fall 6 days ago.  Left lower back and hip pain. EXAM: PELVIS - 1-2 VIEW COMPARISON:  CT abdomen and pelvis 01/02/2013 FINDINGS: Mildly decreased bone mineralization. The bilateral sacroiliac, femoroacetabular, and pubic symphysis joint spaces are maintained. No acute fracture is seen. No dislocation. Moderate right L4-5 disc space narrowing. IMPRESSION: 1. No acute fracture is seen. 2. Moderate right L4-5 disc space narrowing. Electronically Signed   By: Yvonne Kendall M.D.   On: 03/08/2022 11:46   DG Lumbar Spine 2-3 Views  Result Date: 03/08/2022 CLINICAL DATA:  Fall Tuesday night 03/02/2022. Left lower back and hip pain. Back surgery 02/15/2022 for L1 fracture. EXAM: LUMBAR SPINE - 2-3 VIEW COMPARISON:  CT lumbar spine 02/10/2022, MRI lumbar spine 02/10/2022 FINDINGS: There is again a moderate L1 vertebral body compression fracture. Moderate approximately 5 mm retropulsion of the posterosuperior aspect of the L 1 vertebral body is unchanged from 02/10/2022 CT and MRI. Interval bilateral transpedicular screws placed at T11, T12, L2, and L3, with bilateral posterior interconnecting rods. Minimal dextrocurvature centered at T12 is unchanged from prior. 6 mm grade 1  anterolisthesis of L4 on L5, unchanged from prior. Moderate L3-4 and L4-5 and mild T9-10 through T11-12 disc space narrowing. IMPRESSION: Compared to 02/10/2022: 1. Moderate L1 vertebral body compression fracture, unchanged from 02/10/2022 CT and MRI. 2. Interval bilateral posterior fusion from T11-L3 except for the L1 vertebral body level, treatment for the prior L1 compression fracture. 3. Unchanged grade 1 anterolisthesis of L4 on L5. Electronically Signed   By: Yvonne Kendall M.D.   On: 03/08/2022 11:45   MR BRAIN W WO CONTRAST  Result Date: 02/16/2022 CLINICAL DATA:  Metastatic disease evaluation EXAM: MRI HEAD WITHOUT AND WITH CONTRAST TECHNIQUE: Multiplanar, multiecho pulse sequences of the brain and surrounding structures were obtained without and with intravenous contrast. CONTRAST:  50m GADAVIST GADOBUTROL 1 MMOL/ML IV SOLN COMPARISON:  01/11/2021 FINDINGS: Brain: No restricted diffusion to suggest acute or subacute infarct. No abnormal parenchymal or meningeal enhancement. No acute hemorrhage, mass, mass effect, or midline shift. No hydrocephalus or extra-axial collection. 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 Scattered T2 hyperintense signal in the periventricular white matter, likely the sequela of mild-to-moderate chronic small vessel ischemic disease. Vascular: Normal arterial flow voids. Skull and upper cervical spine: Normal marrow signal. Sinuses/Orbits: Clear paranasal sinuses. Status post bilateral lens replacements. No acute finding in the orbits. Other: Trace fluid in the mastoid air cells.  IMPRESSION: No acute intracranial process. No evidence of metastatic disease in the brain. Electronically Signed   By: Merilyn Baba M.D.   On: 02/16/2022 23:45   DG Lumbar Spine 2-3 Views  Result Date: 02/15/2022 CLINICAL DATA:  Fracture L1, fluoroscopic assistance for lumbar fusion EXAM: LUMBAR SPINE - 2-3 VIEW COMPARISON:  02/01/2022 FINDINGS: Fluoroscopic images show posterior surgical  fusion from T11-L3 levels. Compression fracture is seen in the body of L1 vertebra. Fluoroscopic time 64 seconds. Radiation dose 43.6 mGy. IMPRESSION: Fluoroscopic assistance was provided for surgical fusion from T11-L3 levels. Electronically Signed   By: Elmer Picker M.D.   On: 02/15/2022 15:36   DG C-Arm 1-60 Min-No Report  Result Date: 02/15/2022 Fluoroscopy was utilized by the requesting physician.  No radiographic interpretation.   DG C-Arm 1-60 Min-No Report  Result Date: 02/15/2022 Fluoroscopy was utilized by the requesting physician.  No radiographic interpretation.     Assessment and plan- Patient is a 71 y.o. 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  She is meeting with palliative care NP Altha Harm later today and options will include going up on her long-acting pain medicine while continuing as needed oxycodone.  There is also room to increase the dose of gabapentin.  She would potentially benefit from use of short-term steroids as well.  Follow-up with me to be decided based on pain control as per palliative care.  Dysphagia: Refer to speech pathology  Given that she has bone metastases bisphosphonates can help reduce risk of future fractures as well as potentially help her with back pain.  I am awaiting to receive dental clearance from Dr. Juleen China who is her dentist   Visit Diagnosis 1. 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 site, unspecified laterality ([place])   2. Dysphagia, unspecified type   3. Goals of care, counseling/discussion   4. Neoplasm related pain      Dr. Randa Evens, MD, MPH [facility] [phone] 03/15/2022 1:06 PM

## 2022-03-15 NOTE — Addendum Note (Signed)
Addended by: Altha Harm R on: 03/15/2022 01:55 PM   Modules accepted: Orders

## 2022-03-15 NOTE — Progress Notes (Signed)
Met with patient during follow up visit with Dr. Janese Banks to further discuss treatment. All questions answered during visit. Pt stated that she wants to focus on pain control at this time before deciding on treatment. Nothing further needed at this time. Instructed to call with any questions or needs.

## 2022-03-16 ENCOUNTER — Ambulatory Visit
Admission: RE | Admit: 2022-03-16 | Discharge: 2022-03-16 | Disposition: A | Discharge status: Home / Self Care | Payer: Medicare HMO | Source: Ambulatory Visit | Attending: Radiation Oncology | Admitting: Radiation Oncology

## 2022-03-16 ENCOUNTER — Ambulatory Visit
Admission: RE | Admit: 2022-03-16 | Discharge: 2022-03-16 | Disposition: A | Discharge status: Home / Self Care | Payer: Medicare HMO | Source: Ambulatory Visit | Attending: Hospice and Palliative Medicine | Admitting: Hospice and Palliative Medicine

## 2022-03-16 ENCOUNTER — Other Ambulatory Visit: Payer: Self-pay

## 2022-03-16 DIAGNOSIS — G893 Neoplasm related pain (acute) (chronic): Secondary | ICD-10-CM | POA: Insufficient documentation

## 2022-03-16 DIAGNOSIS — C3490 Malignant neoplasm of unspecified part of unspecified bronchus or lung: Secondary | ICD-10-CM | POA: Insufficient documentation

## 2022-03-16 DIAGNOSIS — Z510 Encounter for antineoplastic radiation therapy: Secondary | ICD-10-CM | POA: Diagnosis not present

## 2022-03-16 LAB — RAD ONC ARIA SESSION SUMMARY
Course Elapsed Days: 6
Plan Fractions Treated to Date: 5
Plan Prescribed Dose Per Fraction: 4 Gy
Plan Total Fractions Prescribed: 5
Plan Total Prescribed Dose: 20 Gy
Reference Point Dosage Given to Date: 20 Gy
Reference Point Session Dosage Given: 4 Gy
Session Number: 5

## 2022-03-16 NOTE — Telephone Encounter (Signed)
Form signed and copy is with radiation therapy nurse to give to patient

## 2022-03-18 ENCOUNTER — Other Ambulatory Visit: Payer: Self-pay | Admitting: *Deleted

## 2022-03-18 ENCOUNTER — Telehealth: Payer: Self-pay

## 2022-03-18 DIAGNOSIS — R1310 Dysphagia, unspecified: Secondary | ICD-10-CM

## 2022-03-18 MED ORDER — SUCRALFATE 1 G PO TABS: 1.0000 g | ORAL_TABLET | Freq: Three times a day (TID) | ORAL | 0 refills | Status: DC

## 2022-03-18 NOTE — Telephone Encounter (Signed)
Patient daughter called stating she had dr.chrystal team to fill out FMLA paperwork and she was informed by her employer that the it needs to be continuous instead of intermittent. She stated that her HR stated to not refill the paperwork out but to instead send a letter head to the fax number on the original FMLA paperwork stating its continuous.

## 2022-03-22 ENCOUNTER — Ambulatory Visit: Payer: Medicare HMO

## 2022-03-23 ENCOUNTER — Other Ambulatory Visit: Payer: Self-pay | Admitting: Hospice and Palliative Medicine

## 2022-03-23 ENCOUNTER — Inpatient Hospital Stay (HOSPITAL_BASED_OUTPATIENT_CLINIC_OR_DEPARTMENT_OTHER): Payer: Medicare HMO | Admitting: Hospice and Palliative Medicine

## 2022-03-23 DIAGNOSIS — C3490 Malignant neoplasm of unspecified part of unspecified bronchus or lung: Secondary | ICD-10-CM

## 2022-03-23 DIAGNOSIS — S32000A Wedge compression fracture of unspecified lumbar vertebra, initial encounter for closed fracture: Secondary | ICD-10-CM

## 2022-03-23 DIAGNOSIS — G893 Neoplasm related pain (acute) (chronic): Secondary | ICD-10-CM | POA: Diagnosis not present

## 2022-03-23 MED ORDER — OXYCODONE HCL 20 MG PO TABS: 10.0000 mg | ORAL_TABLET | ORAL | 0 refills | Status: DC | PRN

## 2022-03-23 MED ORDER — GABAPENTIN 300 MG PO CAPS: 300.0000 mg | ORAL_CAPSULE | Freq: Three times a day (TID) | ORAL | 2 refills | Status: DC

## 2022-03-23 NOTE — Progress Notes (Signed)
Virtual Visit via Telephone Note  I connected with Amber Glenn on 03/23/22 at 11:30 AM EST by telephone and verified that I am speaking with the correct person using two identifiers.  Location: Patient: Home Provider: Clinic   I discussed the limitations, risks, security and privacy concerns of performing an evaluation and management service by telephone and the availability of in person appointments. I also discussed with the patient that there may be a patient responsible charge related to this service. The patient expressed understanding and agreed to proceed.   History of Present Illness: Amber Glenn is a 71 y.o. female with multiple medical problems including diabetes, hypertension, hyperlipidemia, depression Patient was hospitalized 02/10/2022 to 02/18/2022 with back pain.  MRI of the lumbar spine revealed an acute pathologic compression fracture of L1 and patient ultimately required percutaneous fixation by neurosurgery.  CT of the chest, abdomen, and pelvis revealed a centrally necrotic anterior mediastinal mass with phrenic nerve impingement and diaphragmatic dysfunction, scattered bilateral lung opacities, hypoenhancement in the inferior pancreatic head/body.  Patient underwent lymph node biopsy positive for adenocarcinoma of the lung.  Patient's hospitalization was complicated by pain and delirium.  Palliative care was consulted to help address goals and manage ongoing symptoms.    Observations/Objective: I called and spoke with patient and daughter.  Patient continues to endorse persistent low back pain despite escalating doses of opioids.  She initially felt some improvement after starting prednisone but that was short-lived.  No adverse effects for reported from pain meds.  Patient is taking oxycodone 20 mg every 4 hours around-the-clock.  No other symptomatic complaints or concerns.  However, pain is limiting mobility and negatively impacting her quality of life.  Assessment and  Plan: Neoplasm related pain -will increase dose of gabapentin to 300 mg 3 times daily.  Continue Xtampza/oxycodone IR.  Continue prednisone taper.  Discussed with IR (Dr. Maryelizabeth Kaufmann) and will refer for consideration of RFA.  She is also pending follow-up with neurosurgery later this week.  Stage IV adenocarcinoma lung -patient decided to hold on pursuing systemic cancer treatment until pain is better controlled.   Follow Up Instructions: Follow-up telephone visit 2 weeks   I discussed the assessment and treatment plan with the patient. The patient was provided an opportunity to ask questions and all were answered. The patient agreed with the plan and demonstrated an understanding of the instructions.   The patient was advised to call back or seek an in-person evaluation if the symptoms worsen or if the condition fails to improve as anticipated.  I provided 15 minutes of non-face-to-face time during this encounter.   [name], NP

## 2022-03-24 ENCOUNTER — Ambulatory Visit
Admission: RE | Admit: 2022-03-24 | Discharge: 2022-03-24 | Disposition: A | Discharge status: Home / Self Care | Payer: Medicare HMO | Source: Ambulatory Visit | Attending: Oncology | Admitting: Oncology

## 2022-03-24 ENCOUNTER — Encounter: Payer: Self-pay | Admitting: *Deleted

## 2022-03-24 ENCOUNTER — Other Ambulatory Visit: Payer: Self-pay

## 2022-03-24 ENCOUNTER — Ambulatory Visit
Admission: RE | Admit: 2022-03-24 | Discharge: 2022-03-24 | Disposition: A | Discharge status: Home / Self Care | Payer: Medicare HMO | Source: Ambulatory Visit | Attending: Hospice and Palliative Medicine | Admitting: Hospice and Palliative Medicine

## 2022-03-24 ENCOUNTER — Other Ambulatory Visit: Payer: Self-pay | Admitting: Oncology

## 2022-03-24 DIAGNOSIS — S32000A Wedge compression fracture of unspecified lumbar vertebra, initial encounter for closed fracture: Secondary | ICD-10-CM

## 2022-03-24 DIAGNOSIS — C3492 Malignant neoplasm of unspecified part of left bronchus or lung: Secondary | ICD-10-CM | POA: Diagnosis not present

## 2022-03-24 DIAGNOSIS — Z981 Arthrodesis status: Secondary | ICD-10-CM

## 2022-03-24 HISTORY — PX: IR RADIOLOGIST EVAL & MGMT: IMG5224

## 2022-03-24 NOTE — H&P (Signed)
Reason for visit; L1 pathologic compression fx with poorly controlled pain. Referral for percutaneous RFA "OsteoCool" at the request of [place] Team(s); Primary Care: Lynnell Jude, MD Medical Oncology: Sindy Guadeloupe, MD Hospice and Palliative Medicine: [place] [facility]: Noreene Filbert, MD   Neurosurgery:  Meade Maw, MD    Virtual Visit via Telephone Note   I connected with on Amber Glenn on 03/24/22 by telephone and verified that I am speaking with the correct person using two identifiers. I discussed the limitations, risks, security and privacy concerns of performing an evaluation and management service by telephone and the availability of in-person appointments.  They are joined in the visit by their adult daughter, Amber Glenn.  History of Present Illness:  Amber Glenn is a 70 F comorbid with PMHx significant for smoking and recently diagnosed metastatic lung CA. Briefly, she had presented to ER on 02/10/22 s/p mechanical fall with workup revealing L1 pathologic fracture. Secondary to 3 column involvement and concern for instability, Neurosurgery proceeded with ORIF and T11-L3 PSLF on 02/15/22 Izora Ribas). Supraclavicular LN Bx on 02/11/22 Kathlene Cote) was revealing for metastatic adenoCA. Radiation Oncology recommended palliative XRT, to begin appropriately after healing from her back surgery. The patient continues to experience significant discomfort, despite stabilization, and Palliative Medicine has been involved in helping control her back pain with PO analgesia, though his has been challenging with her reported dysphagia with swallowing pills. Medical Oncology is following, however the patient has not wished to proceed with systemic treatment until her pain is better controlled.   She is referred in evaluation for percutaneous radiofrequency ablation "OsteoCool" for directed lesional therapy at her L1 spinal metastasis. She  endorses significant lower back pain, radiating to her left hip. She is reportedly fully ambulatory while unassisted and denies bowel or bladder dysfunction.  Review of Systems: A 12-point ROS discussed, and pertinent positives are indicated in the HPI above.  All other systems are negative.   Past Medical History:  Diagnosis Date   Arthritis    hands and feet   Cataract    right   Complication of anesthesia    sometimes have a hard time waking up   Diabetes mellitus    Borderline Diabetes   GERD (gastroesophageal reflux disease)    HTN (hypertension)    Hyperlipidemia    Motion sickness    Post corneal transplant    right    Past Surgical History:  Procedure Laterality Date   APPLICATION OF INTRAOPERATIVE CT SCAN  02/15/2022   Procedure: APPLICATION OF INTRAOPERATIVE CT SCAN;  Surgeon: Meade Maw, MD;  Location: [facility] ORS;  Service: Neurosurgery;;   BASAL CELL CARCINOMA EXCISION     Dr. Manley Mason   CARPAL TUNNEL RELEASE     right   COLONOSCOPY WITH PROPOFOL N/A 12/03/2015   Procedure: COLONOSCOPY WITH PROPOFOL;  Surgeon: Manya Silvas, MD;  Location: [facility] ENDOSCOPY;  Service: Endoscopy;  Laterality: N/A;   COLONOSCOPY WITH PROPOFOL N/A 02/22/2020   Procedure: COLONOSCOPY WITH PROPOFOL;  Surgeon: Lesly Rubenstein, MD;  Location: [facility] ENDOSCOPY;  Service: Endoscopy;  Laterality: N/A;   ESOPHAGOGASTRODUODENOSCOPY (EGD) WITH PROPOFOL N/A 02/22/2020   Procedure: ESOPHAGOGASTRODUODENOSCOPY (EGD) WITH PROPOFOL;  Surgeon: Lesly Rubenstein, MD;  Location: [facility] ENDOSCOPY;  Service: Endoscopy;  Laterality: N/A;   HAMMER TOE SURGERY Right 05/23/2019   Procedure: HAMMER TOE CORRECTION;  Surgeon: Samara Deist, DPM;  Location: [place];  Service: Podiatry;  Laterality: Right;  Diabetic  IR RADIOLOGIST EVAL & MGMT  03/24/2022   OSTECTOMY Right 05/23/2019   Procedure: DOUBLE OSTEOTOMY RIGHT;  Surgeon: Samara Deist, DPM;  Location: [facility];  Service:  Podiatry;  Laterality: Right;   SKIN CANCER EXCISION      Allergies: Tramadol  Medications: Prior to Admission medications   Medication Sig Start Date End Date Taking? Authorizing Provider  acetaminophen (TYLENOL) 500 MG tablet Take 500 mg by mouth every 6 (six) hours as needed for moderate pain, mild pain, fever or headache.    [provider]  Ascorbic Acid (VITAMIN C) 1000 MG tablet Take 1,000 mg by mouth daily.      [provider]  carvedilol (COREG) 3.125 MG tablet Take 3.125 mg by mouth 2 (two) times daily with a meal. 12/03/21 12/03/22  [provider]  cyclobenzaprine (FLEXERIL) 5 MG tablet Take 1 tablet (5 mg total) by mouth 3 (three) times daily as needed for muscle spasms. 03/08/22   Borders, Kirt Boys, NP  Docusate Calcium (STOOL SOFTENER PO) Take 1 capsule by mouth daily as needed (constipation).    [provider]  Ferrous Sulfate (IRON) 325 (65 Fe) MG TABS Take 1 tablet by mouth daily at 12 noon.    [provider]  gabapentin (NEURONTIN) 300 MG capsule Take 1 capsule (300 mg total) by mouth 3 (three) times daily. 03/23/22   Borders, Kirt Boys, NP  losartan (COZAAR) 100 MG tablet Take 100 mg by mouth daily. 12/03/21 12/03/22  [provider]  melatonin 5 MG TABS Take 1 tablet (5 mg total) by mouth at bedtime as needed. 02/18/22 05/19/22  Richarda Osmond, MD  metFORMIN (GLUCOPHAGE) 500 MG tablet TAKE 1 TABLET TWICE DAILY WITH A MEAL 05/27/15   Jackolyn Confer, MD  Multiple Vitamin (MULTIVITAMIN) capsule Take 1 capsule by mouth daily.      [provider]  nystatin (MYCOSTATIN) 100000 UNIT/ML suspension Use as directed 5 mLs (500,000 Units total) in the mouth or throat 4 (four) times daily. 03/09/22   Borders, Kirt Boys, NP  omeprazole (PRILOSEC) 20 MG capsule Take 1 capsule (20 mg total) by mouth daily. 12/20/14   Jackolyn Confer, MD  ondansetron (ZOFRAN) 8 MG tablet Take 1 tablet (8 mg total) by mouth every 8 (eight)  hours as needed for nausea or vomiting. 03/11/22   Borders, Kirt Boys, NP  oxyCODONE ER (XTAMPZA ER) 18 MG C12A Take 1 capsule by mouth every 12 (twelve) hours. 03/15/22   Borders, Kirt Boys, NP  Oxycodone HCl 20 MG TABS Take 0.5-1 tablets (10-20 mg total) by mouth every 4 (four) hours as needed. 03/23/22   Borders, Kirt Boys, NP  polyethylene glycol (MIRALAX / GLYCOLAX) 17 g packet Take 17 g by mouth daily. 02/18/22   Richarda Osmond, MD  prednisoLONE acetate (PRED MILD) 0.12 % ophthalmic suspension Place 1 drop into the right eye daily.    [provider]  predniSONE (DELTASONE) 20 MG tablet Take 2 tablets (40mg ) x 3 days, then take 1 tablet (20mg ) x 3 days, then take 1/2 tablet (10mg ) x 1 week, then stop 03/15/22   Borders, Kirt Boys, NP  prochlorperazine (COMPAZINE) 10 MG tablet Take 1 tablet (10 mg total) by mouth every 6 (six) hours as needed for nausea or vomiting. 03/11/22   Borders, Kirt Boys, NP  simvastatin (ZOCOR) 40 MG tablet TAKE 1 TABLET EVERY DAY  AT  6:00 PM 12/20/14   Jackolyn Confer, MD  sucralfate (CARAFATE) 1 g  tablet Take 1 tablet (1 g total) by mouth 4 (four) times daily -  with meals and at bedtime. 03/18/22   Borders, Kirt Boys, NP     Family History  Problem Relation Age of Onset   Heart disease Mother    Heart disease Father    Heart disease Maternal Grandmother    Heart disease Maternal Grandfather    Breast cancer Sister 59    Social History   Socioeconomic History   Marital status: Widowed    Spouse name: Not on file   Number of children: Not on file   Years of education: Not on file   Highest education level: Not on file  Occupational History    Employer: [employer]    Comment: Currently not employed  Tobacco Use   Smoking status: Former    Types: Cigarettes    Quit date: 01/13/2009    Years since quitting: 13.2   Smokeless tobacco: Never  Substance and Sexual Activity   Alcohol use: No   Drug use: No   Sexual activity: Yes    Birth  control/protection: Post-menopausal  Other Topics Concern   Not on file  Social History Narrative   Not on file   Social Determinants of Health   Financial Resource Strain: Not on file  Food Insecurity: No Food Insecurity (02/10/2022)   Hunger Vital Sign    Worried About Running Out of Food in the Last Year: Never true    Ran Out of Food in the Last Year: Never true  Transportation Needs: No Transportation Needs (02/10/2022)   PRAPARE - Hydrologist (Medical): No    Lack of Transportation (Non-Medical): No  Physical Activity: Not on file  Stress: Not on file  Social Connections: Not on file    Review of Systems As above  Vital Signs: Deferred secondary to virtual visit.  Physical Exam Deferred secondary to virtual visit.  Imaging:  CT L spine, 03/16/22 Imaging independently reviewed, demonstrating L1 osseous metastasis with severe demineralization.     DG ESOPHAGUS W DOUBLE CM (HD)  Result Date: 03/24/2022 CLINICAL DATA:  Dysphagia, lung cancer EXAM: ESOPHOGRAM / BARIUM SWALLOW / BARIUM TABLET STUDY TECHNIQUE: Combined double contrast and single contrast examination performed using effervescent crystals, thick barium liquid, and thin barium liquid. The patient was observed with fluoroscopy swallowing a 13 mm barium sulphate tablet. FLUOROSCOPY: Radiation Exposure Index (as provided by the fluoroscopic device): 5 mGy COMPARISON:  None Available. FINDINGS: Normal pharyngeal anatomy and motility. Contrast flowed freely through the esophagus without evidence of a mass. Normal esophageal mucosa without evidence of irregularity or ulceration. Mild tertiary contractions of the esophagus as can be seen with mild spasm. Mild gastroesophageal reflux. Mild relative narrowing of the distal esophagus just proximal to the gastroesophageal junction concerning for a mild stricture restricting the passage of a 13 mm barium tablet. No definite hiatal hernia was  demonstrated. IMPRESSION: 1. Mild relative narrowing of the distal esophagus just proximal to the gastroesophageal junction concerning for a mild stricture restricting the passage of a 13 mm barium tablet. 2. Mild tertiary contractions of the esophagus as can be seen with mild spasm. Electronically Signed   By: Amber Glenn M.D.   On: 03/24/2022 10:17   CT Lumbar Spine Wo Contrast  Result Date: 03/16/2022 CLINICAL DATA:  Musculoskeletal neoplasm. Persistent back pain. Evaluate for feasibility of RFA. EXAM: CT LUMBAR SPINE WITHOUT CONTRAST TECHNIQUE: Multidetector CT imaging of the lumbar spine was performed without  intravenous contrast administration. Multiplanar CT image reconstructions were also generated. RADIATION DOSE REDUCTION: This exam was performed according to the departmental dose-optimization program which includes automated exposure control, adjustment of the mA and/or kV according to patient size and/or use of iterative reconstruction technique. COMPARISON:  CT lumbar spine dated February 10, 2022 FINDINGS: Segmentation: 5 lumbar type vertebrae. Alignment: Grade 1 anterolisthesis of L4. Vertebrae: Pathological fracture of L1 vertebral body with multiple lucencies involving the vertebral bodies and bilateral pedicles is again noted. Persistent approximately 5 mm bulge of the vertebral body into the spinal canal. Posterior spinal fusion with bipedicular screws from T11-L3, sparing the L1. Paraspinal and other soft tissues: Atherosclerotic calcification of abdominal aorta and branch vessels. No acute abnormality. Disc levels: T12-L1: No significant disc bulge, spinal canal or neural foraminal stenosis. L1-L2: No significant disc bulge, spinal canal or neural foraminal stenosis. L2-L3: No significant disc bulge, spinal canal or neural foraminal stenosis. L3-L4: Disc height loss and disc osteophyte complex with mild disc bulge. Mild bilateral facet joint arthropathy. Mild narrowing of lateral recesses  bilaterally. No significant neural foraminal stenosis. L4-L5: Anterolisthesis of L4. Broad-based disc bulge and moderate bilateral facet joint arthropathy. Narrowing of lateral recesses bilaterally. Mild bilateral neural foraminal stenosis. L5-S1: Broad-based disc bulge and moderate facet joint arthropathy. Mild bilateral lateral recess stenosis. No significant neural foraminal stenosis IMPRESSION: 1. Pathological fracture of L1 vertebral body with multiple lucencies involving the vertebral bodies and bilateral pedicles is again noted. Persistent approximately 5 mm bulge of the vertebral body into the spinal canal. 2. Posterior spinal fusion with bipedicular screws from T11-L3, sparing the L1. 3. Multilevel degenerative disc disease with disc osteophyte complex at L3-L4, L4-L5 and L5-S1 with narrowing of lateral recesses bilaterally at L3-L4, L4-L5 and L5-S1. 4. Aortic atherosclerosis. Aortic Atherosclerosis (ICD10-I70.0). Electronically Signed   By: Amber Glenn D.O.   On: 03/16/2022 11:35   DG Lumbar Spine 2-3 Views  Result Date: 03/08/2022 CLINICAL DATA:  Fall Tuesday night 03/02/2022. Left lower back and hip pain. Back surgery 02/15/2022 for L1 fracture. EXAM: LUMBAR SPINE - 2-3 VIEW COMPARISON:  CT lumbar spine 02/10/2022, MRI lumbar spine 02/10/2022 FINDINGS: There is again a moderate L1 vertebral body compression fracture. Moderate approximately 5 mm retropulsion of the posterosuperior aspect of the L 1 vertebral body is unchanged from 02/10/2022 CT and MRI. Interval bilateral transpedicular screws placed at T11, T12, L2, and L3, with bilateral posterior interconnecting rods. Minimal dextrocurvature centered at T12 is unchanged from prior. 6 mm grade 1 anterolisthesis of L4 on L5, unchanged from prior. Moderate L3-4 and L4-5 and mild T9-10 through T11-12 disc space narrowing. IMPRESSION: Compared to 02/10/2022: 1. Moderate L1 vertebral body compression fracture, unchanged from 02/10/2022 CT and MRI. 2.  Interval bilateral posterior fusion from T11-L3 except for the L1 vertebral body level, treatment for the prior L1 compression fracture. 3. Unchanged grade 1 anterolisthesis of L4 on L5. Electronically Signed   By: Amber Glenn M.D.   On: 03/08/2022 11:45    Labs:  CBC: Recent Labs    02/16/22 0340 02/17/22 0901 02/18/22 0346 02/24/22 1040  WBC 12.8* 12.7* 9.1 13.7*  HGB 9.7* 9.5* 8.9* 11.4*  HCT 31.5* 30.7* 28.1* 37.5  PLT 295 270 272 333    COAGS: No results for input(s): "INR", "APTT" in the last 8760 hours.  BMP: Recent Labs    02/16/22 0340 02/17/22 0901 02/18/22 0346 02/24/22 1040  NA 137 134* 134* 132*  K 4.5 4.0 3.9 4.3  CL 108  104 102 98  CO2 23 23 24 24   GLUCOSE 121* 106* 101* 141*  BUN 13 10 9 13   CALCIUM 8.8* 9.4 8.9 9.7  CREATININE 0.54 0.57 0.46 0.58  GFRNONAA >60 >60 >60 >60   Pathology:  SURGICAL PATHOLOGY  CASE: [id number]  PATIENT: Amber Glenn  Surgical Pathology Report   Specimen Submitted:  A. Lymph node, left supraclavicular   Clinical History: L1 metastasis, mediastinal mass, mildly enlarged  mediastinal and supraclavicular lymph nodes, possible subtle pancreatic  mass.  Metastatic carcinoma.   DIAGNOSIS:  A. LYMPH NODE, LEFT SUPRACLAVICULAR; ULTRASOUND-GUIDED BIOPSY:  - POSITIVE FOR MALIGNANCY.  - METASTATIC CARCINOMA, FAVOR PULMONARY ADENOCARCINOMA.    Assessment and Plan:  71 y/o F w recently diagnosed metastatic lung CA and symptomatic L1 pathologic fracture s/p T11-L3 PSLF.  Planned XRT. Pt with severe osseous demineralization at lesion.  The Pt is interested in pursuing a minimally-invasive option for palliative therapy of symptomatic spinal metastasis at this time, and is curious about Osteocool Radiofrequency Ablation.    Risks were discussed including, but not limited to, bleeding, infection, cement migration which may cause spinal cord damage, paralysis, pulmonary embolism or even death.   *HIGH RISK given severe  demineralization* The procedure has been fully reviewed with the patient/patient's authorized representative. The risks, benefits and alternatives have been explained, and the patient/patient's authorized representative has consented to the procedure.    *CT L-spine and L-spine XR reviewed. No additional imaging required. *Procedure to be performed at [facility]. *Will require both CT and Fluoroscopic guidance secondary to severe demineralization. *Tentatively scheduled for 04/02/22. *Will request Medical sales representative presence ([employer] KP / Osteocool) *Same day procedure, no overnight admission. *Ancef for pre op Abx.  Thank you for this interesting consult.  I greatly enjoyed meeting Amber Glenn and look forward to participating in their care.  A copy of this report was sent to the requesting provider on this date.   Electronically Signed:  Michaelle Birks, MD [facility] Rad[facility]   Pager. [phone] Clinic. [phone]  I spent a total of 45 Minutes of non-face-to-face time in clinical consultation, greater than 50% of which was counseling/coordinating care for Amber Glenn evaluation for treatment of symptomatic osseous metastasis.

## 2022-03-24 NOTE — Telephone Encounter (Signed)
Letter faxed to today with request for Full FMLA.

## 2022-03-25 ENCOUNTER — Ambulatory Visit
Admission: RE | Admit: 2022-03-25 | Discharge: 2022-03-25 | Disposition: A | Discharge status: Home / Self Care | Payer: Medicare HMO | Source: Ambulatory Visit | Attending: Neurosurgery | Admitting: Neurosurgery

## 2022-03-25 ENCOUNTER — Ambulatory Visit (INDEPENDENT_AMBULATORY_CARE_PROVIDER_SITE_OTHER): Payer: Medicare HMO | Admitting: Neurosurgery

## 2022-03-25 ENCOUNTER — Encounter: Payer: Self-pay | Admitting: Neurosurgery

## 2022-03-25 ENCOUNTER — Ambulatory Visit
Admission: RE | Admit: 2022-03-25 | Discharge: 2022-03-25 | Disposition: A | Discharge status: Home / Self Care | Payer: Medicare HMO | Attending: Neurosurgery | Admitting: Neurosurgery

## 2022-03-25 VITALS — BP 107/62 | HR 126 | Temp 97.6°F | Wt 119.2 lb

## 2022-03-25 DIAGNOSIS — Z981 Arthrodesis status: Secondary | ICD-10-CM

## 2022-03-25 DIAGNOSIS — Z049 Encounter for examination and observation for unspecified reason: Secondary | ICD-10-CM

## 2022-03-25 DIAGNOSIS — R490 Dysphonia: Secondary | ICD-10-CM

## 2022-03-25 DIAGNOSIS — R1310 Dysphagia, unspecified: Secondary | ICD-10-CM

## 2022-03-25 DIAGNOSIS — M532X6 Spinal instabilities, lumbar region: Secondary | ICD-10-CM

## 2022-03-25 DIAGNOSIS — M8448XD Pathological fracture, other site, subsequent encounter for fracture with routine healing: Secondary | ICD-10-CM

## 2022-03-25 DIAGNOSIS — Z09 Encounter for follow-up examination after completed treatment for conditions other than malignant neoplasm: Secondary | ICD-10-CM

## 2022-03-25 NOTE — Progress Notes (Signed)
   REFERRING PHYSICIAN:  Lynnell Jude, Md [address] [place],  [place] [address]  DOS: PSF T11-L3 for pathologic L1 compression fracture on 02/15/22  HISTORY OF PRESENT ILLNESS: Amber Glenn is status post PSF T11-L3 for pathologic L1 compression fracture.  She continues to have significant pain in her left lower back.  Her mid back pain seems improved.  She has had a change in her voice since her biopsy in her neck.  She is also has some difficulty with swallowing.     PHYSICAL EXAMINATION:  General: Patient is well developed, well nourished, calm, collected, and in no apparent distress.   NEUROLOGICAL:  General: In no acute distress.   Awake, alert, oriented to person, place, and time.  Pupils equal round and reactive to light.  Facial tone is symmetric.     Strength:            Side Iliopsoas Quads Hamstring PF DF EHL  R 5 5 5 5 5 5   L 5 5 5 5 5 5    Incisions c/d/i   ROS (Neurologic):  Negative except as noted above  IMAGING: No complications noted  ASSESSMENT/PLAN:  Amber Glenn is doing well s/p above surgery.  I think she will slowly improve.  She is having an ablation done next week.  I will see her back in about 6 weeks.  I will refer her to otolaryngology for evaluation of her hoarseness.    Meade Maw MD Department of neurosurgery

## 2022-03-25 NOTE — Addendum Note (Signed)
Addended by: Berdine Addison on: 03/25/2022 03:17 PM   Modules accepted: Orders

## 2022-03-26 ENCOUNTER — Inpatient Hospital Stay: Payer: Medicare HMO

## 2022-03-26 ENCOUNTER — Other Ambulatory Visit: Payer: Self-pay | Admitting: *Deleted

## 2022-03-26 MED ORDER — CELECOXIB 200 MG PO CAPS: 200.0000 mg | ORAL_CAPSULE | Freq: Two times a day (BID) | ORAL | 1 refills | Status: DC

## 2022-03-26 MED ORDER — CYCLOBENZAPRINE HCL 5 MG PO TABS: 5.0000 mg | ORAL_TABLET | Freq: Three times a day (TID) | ORAL | 0 refills | Status: DC | PRN

## 2022-03-26 NOTE — Progress Notes (Signed)
Nutrition Assessment   Reason for Assessment:   Referral from [place], NP   ASSESSMENT:  RD has been unable to connect with patient following patient being on schedule on 11/22.  RN Navigator suggested calling daughter [name].  RD called and spoke with [name], daughter.  Daughter reports biggest thing effecting intake is swallowing.  Had swallow test yesterday which noted some narrowing per daughter.  Daughter has asked for ENT evaluation as well as patient is hoarse.  Patient has been eating tomato soup mixed with half and half, grits, premier protein shakes, applesauce, yogurt, macaroni, 1/4 of sub sandwich yesterday.  Daughter surprised that patient was able to tolerate sub sandwich.     Medications: MVI, prilosec, Vit C, colace, metformin, miralax   Labs: reviewed   Anthropometrics:   Height: 60 inches Weight: 119 lb 3.2 oz on 12/14 130 lb on 02/10/2022 BMI: 23  8% weight loss in the last month and half, significant   Estimated Energy Needs  Kcals: 1600-1800 Protein: 80-90 g Fluid: 1600-1800 ml   NUTRITION DIAGNOSIS: Inadequate oral intake related to dysphagia, cancer diagnosis, surgery for L1 compression fx as evidenced by 8% weight loss in the last month and half   INTERVENTION:  Discussed ways to add calories and protein in diet.  Daughter currently utilizing those techniques Would encourage adding moisture/liquid to foods for ease of swallowing. Continue premier protein shakes for added nutrition Daughter has RD contact information and will contact if needed in the future   MONITORING, EVALUATION, GOAL: weight trends, intake   Next Visit: no follow-up Dtr to contact RD if needed in the future  Calyssa Zobrist B. Zenia Resides, [place], [place] Registered Dietitian [phone]

## 2022-03-29 ENCOUNTER — Telehealth: Payer: Self-pay | Admitting: *Deleted

## 2022-03-29 DIAGNOSIS — M8448XS Pathological fracture, other site, sequela: Secondary | ICD-10-CM

## 2022-03-29 NOTE — Telephone Encounter (Signed)
Pt's daughter called in to report that pt's heart rate has been elevated more frequently over the past few days. Heart rate fluctuates between 110-160 at times. Also complains of shortness of breath during these episodes. Pt's daughter is concerned if her symptoms could be related to anxiety.   Please advise.

## 2022-03-30 ENCOUNTER — Other Ambulatory Visit: Payer: Self-pay | Admitting: *Deleted

## 2022-03-30 ENCOUNTER — Ambulatory Visit
Admission: RE | Admit: 2022-03-30 | Discharge: 2022-03-30 | Disposition: A | Discharge status: Home / Self Care | Payer: Medicare HMO | Attending: Hospice and Palliative Medicine | Admitting: Hospice and Palliative Medicine

## 2022-03-30 ENCOUNTER — Ambulatory Visit
Admission: RE | Admit: 2022-03-30 | Discharge: 2022-03-30 | Disposition: A | Discharge status: Home / Self Care | Payer: Medicare HMO | Source: Ambulatory Visit | Attending: Hospice and Palliative Medicine | Admitting: Hospice and Palliative Medicine

## 2022-03-30 DIAGNOSIS — M8448XS Pathological fracture, other site, sequela: Secondary | ICD-10-CM

## 2022-03-30 DIAGNOSIS — C3490 Malignant neoplasm of unspecified part of unspecified bronchus or lung: Secondary | ICD-10-CM | POA: Insufficient documentation

## 2022-03-30 MED ORDER — LORAZEPAM 0.5 MG PO TABS: 0.5000 mg | ORAL_TABLET | Freq: Three times a day (TID) | ORAL | 0 refills | Status: DC | PRN

## 2022-03-30 NOTE — Addendum Note (Signed)
Addended by: Telford Nab on: 03/30/2022 12:06 PM   Modules accepted: Orders

## 2022-03-30 NOTE — Telephone Encounter (Signed)
Per Merrily Pew, will try lorazepam 0.5mg  every 8 hours as needed to see if that helps. Pt's daughter made aware and instructed to call back if symptoms do not improve.

## 2022-03-30 NOTE — Telephone Encounter (Signed)
Xray results reviewed with pt's daughter. Pt's daughter stated that she has noticed pt becoming more forgetful and having episodes of dizziness today after the fall and is concerned she may have hit her head during the fall. Per Merrily Pew, pt will need further evaluation in the ED due to acute neurological symptoms. Pt's daughter made aware and she stated that pt is sleeping at this time and will reassess her when she wakes up. Verbalized understanding if neurological symptoms persist that she needs to go to the ED for further evaluation. Nothing further needed at this time.

## 2022-03-30 NOTE — Telephone Encounter (Signed)
Pt's daughter called back to report that pt had minor fall this morning and is unable to stand straight when ambulating. States has to walk "hunched over." Per Josh, will order thoracic and lumbar spine xray to rule out fracture. Pt's daughter made aware that pt can go for xray today and results will be called once reported.

## 2022-03-31 ENCOUNTER — Other Ambulatory Visit: Payer: Self-pay

## 2022-03-31 ENCOUNTER — Observation Stay
Admission: EM | Admit: 2022-03-31 | Discharge: 2022-04-01 | Disposition: A | Discharge status: Home / Self Care | Payer: Medicare HMO | Attending: Internal Medicine | Admitting: Internal Medicine

## 2022-03-31 ENCOUNTER — Inpatient Hospital Stay: Payer: Medicare HMO

## 2022-03-31 ENCOUNTER — Emergency Department: Payer: Medicare HMO

## 2022-03-31 ENCOUNTER — Encounter: Payer: Self-pay | Admitting: Emergency Medicine

## 2022-03-31 ENCOUNTER — Other Ambulatory Visit (HOSPITAL_COMMUNITY): Payer: Self-pay

## 2022-03-31 DIAGNOSIS — Z7984 Long term (current) use of oral hypoglycemic drugs: Secondary | ICD-10-CM | POA: Insufficient documentation

## 2022-03-31 DIAGNOSIS — J9621 Acute and chronic respiratory failure with hypoxia: Secondary | ICD-10-CM | POA: Diagnosis not present

## 2022-03-31 DIAGNOSIS — C7951 Secondary malignant neoplasm of bone: Secondary | ICD-10-CM | POA: Insufficient documentation

## 2022-03-31 DIAGNOSIS — I10 Essential (primary) hypertension: Secondary | ICD-10-CM | POA: Diagnosis not present

## 2022-03-31 DIAGNOSIS — R0902 Hypoxemia: Secondary | ICD-10-CM

## 2022-03-31 DIAGNOSIS — Z79899 Other long term (current) drug therapy: Secondary | ICD-10-CM | POA: Diagnosis not present

## 2022-03-31 DIAGNOSIS — I809 Phlebitis and thrombophlebitis of unspecified site: Secondary | ICD-10-CM | POA: Diagnosis not present

## 2022-03-31 DIAGNOSIS — Z1152 Encounter for screening for COVID-19: Secondary | ICD-10-CM | POA: Insufficient documentation

## 2022-03-31 DIAGNOSIS — I2699 Other pulmonary embolism without acute cor pulmonale: Secondary | ICD-10-CM | POA: Insufficient documentation

## 2022-03-31 DIAGNOSIS — R0602 Shortness of breath: Secondary | ICD-10-CM | POA: Diagnosis present

## 2022-03-31 DIAGNOSIS — Z8616 Personal history of COVID-19: Secondary | ICD-10-CM | POA: Insufficient documentation

## 2022-03-31 DIAGNOSIS — J189 Pneumonia, unspecified organism: Secondary | ICD-10-CM | POA: Insufficient documentation

## 2022-03-31 DIAGNOSIS — Z87891 Personal history of nicotine dependence: Secondary | ICD-10-CM | POA: Insufficient documentation

## 2022-03-31 DIAGNOSIS — Z7901 Long term (current) use of anticoagulants: Secondary | ICD-10-CM | POA: Diagnosis not present

## 2022-03-31 DIAGNOSIS — Z794 Long term (current) use of insulin: Secondary | ICD-10-CM | POA: Diagnosis not present

## 2022-03-31 DIAGNOSIS — C3490 Malignant neoplasm of unspecified part of unspecified bronchus or lung: Secondary | ICD-10-CM | POA: Diagnosis present

## 2022-03-31 DIAGNOSIS — Z85118 Personal history of other malignant neoplasm of bronchus and lung: Secondary | ICD-10-CM | POA: Insufficient documentation

## 2022-03-31 DIAGNOSIS — K219 Gastro-esophageal reflux disease without esophagitis: Secondary | ICD-10-CM | POA: Diagnosis present

## 2022-03-31 DIAGNOSIS — E119 Type 2 diabetes mellitus without complications: Secondary | ICD-10-CM | POA: Insufficient documentation

## 2022-03-31 DIAGNOSIS — J9601 Acute respiratory failure with hypoxia: Principal | ICD-10-CM

## 2022-03-31 DIAGNOSIS — Z85828 Personal history of other malignant neoplasm of skin: Secondary | ICD-10-CM | POA: Diagnosis not present

## 2022-03-31 LAB — COMPREHENSIVE METABOLIC PANEL
ALT: 19 U/L (ref 0–44)
AST: 21 U/L (ref 15–41)
Albumin: 3.2 g/dL — ABNORMAL LOW (ref 3.5–5.0)
Alkaline Phosphatase: 74 U/L (ref 38–126)
Anion gap: 7 (ref 5–15)
BUN: 26 mg/dL — ABNORMAL HIGH (ref 8–23)
CO2: 26 mmol/L (ref 22–32)
Calcium: 9.1 mg/dL (ref 8.9–10.3)
Chloride: 101 mmol/L (ref 98–111)
Creatinine, Ser: 0.71 mg/dL (ref 0.44–1.00)
GFR, Estimated: >60 mL/min (ref >60)
Glucose, Bld: 122 mg/dL — ABNORMAL HIGH (ref 70–99)
Potassium: 4.5 mmol/L (ref 3.5–5.1)
Sodium: 134 mmol/L — ABNORMAL LOW (ref 135–145)
Total Bilirubin: 0.8 mg/dL (ref 0.3–1.2)
Total Protein: 6.3 g/dL — ABNORMAL LOW (ref 6.5–8.1)

## 2022-03-31 LAB — CBG MONITORING, ED
Glucose-Capillary: 96 mg/dL (ref 70–99)
Glucose-Capillary: 112 mg/dL — ABNORMAL HIGH (ref 70–99)
Glucose-Capillary: 124 mg/dL — ABNORMAL HIGH (ref 70–99)

## 2022-03-31 LAB — CBC WITH DIFFERENTIAL/PLATELET
Abs Immature Granulocytes: 0.07 10*3/uL (ref 0.00–0.07)
Basophils Absolute: 0 10*3/uL (ref 0.0–0.1)
Basophils Relative: 0 %
Eosinophils Absolute: 0.4 10*3/uL (ref 0.0–0.5)
Eosinophils Relative: 4 %
HCT: 35.5 % — ABNORMAL LOW (ref 36.0–46.0)
Hemoglobin: 10.5 g/dL — ABNORMAL LOW (ref 12.0–15.0)
Immature Granulocytes: 1 %
Lymphocytes Relative: 5 %
Lymphs Abs: 0.6 10*3/uL — ABNORMAL LOW (ref 0.7–4.0)
MCH: 24.9 pg — ABNORMAL LOW (ref 26.0–34.0)
MCHC: 29.6 g/dL — ABNORMAL LOW (ref 30.0–36.0)
MCV: 84.1 fL (ref 80.0–100.0)
Monocytes Absolute: 0.6 10*3/uL (ref 0.1–1.0)
Monocytes Relative: 5 %
Neutro Abs: 10.3 10*3/uL — ABNORMAL HIGH (ref 1.7–7.7)
Neutrophils Relative %: 85 %
Platelets: 223 10*3/uL (ref 150–400)
RBC: 4.22 MIL/uL (ref 3.87–5.11)
RDW: 17.9 % — ABNORMAL HIGH (ref 11.5–15.5)
WBC: 12.1 10*3/uL — ABNORMAL HIGH (ref 4.0–10.5)
nRBC: 0 % (ref 0.0–0.2)

## 2022-03-31 LAB — TROPONIN I (HIGH SENSITIVITY)
Troponin I (High Sensitivity): 11 ng/L (ref <18)
Troponin I (High Sensitivity): 11 ng/L (ref <18)

## 2022-03-31 LAB — RESP PANEL BY RT-PCR (RSV, FLU A&B, COVID)  RVPGX2
Influenza A by PCR: NEGATIVE
Influenza B by PCR: NEGATIVE
Resp Syncytial Virus by PCR: NEGATIVE
SARS Coronavirus 2 by RT PCR: NEGATIVE

## 2022-03-31 MED ORDER — GABAPENTIN 300 MG PO CAPS
300.0000 mg | ORAL_CAPSULE | Freq: Three times a day (TID) | ORAL | Status: DC
  Administered 2022-03-31 (×2): 300 mg via ORAL
  Filled 2022-03-31 (×4): qty 1

## 2022-03-31 MED ORDER — ADULT MULTIVITAMIN W/MINERALS CH
1.0000 | ORAL_TABLET | Freq: Every day | ORAL | Status: DC
  Administered 2022-03-31 – 2022-04-01 (×2): 1 via ORAL
  Filled 2022-03-31 (×2): qty 1

## 2022-03-31 MED ORDER — POLYETHYLENE GLYCOL 3350 17 G PO PACK
17.0000 g | PACK | Freq: Every day | ORAL | Status: DC
  Administered 2022-03-31: 17 g via ORAL
  Filled 2022-03-31: qty 1

## 2022-03-31 MED ORDER — SODIUM CHLORIDE 0.9 % IV SOLN
2.0000 g | INTRAVENOUS | Status: DC
  Administered 2022-04-01: 2 g via INTRAVENOUS
  Filled 2022-03-31: qty 2

## 2022-03-31 MED ORDER — LORAZEPAM 0.5 MG PO TABS: 0.5000 mg | ORAL_TABLET | Freq: Three times a day (TID) | ORAL | Status: DC | PRN

## 2022-03-31 MED ORDER — VITAMIN C 500 MG PO TABS
1000.0000 mg | ORAL_TABLET | Freq: Every day | ORAL | Status: DC
  Administered 2022-03-31 – 2022-04-01 (×2): 1000 mg via ORAL
  Filled 2022-03-31 (×2): qty 2

## 2022-03-31 MED ORDER — CYCLOBENZAPRINE HCL 10 MG PO TABS
5.0000 mg | ORAL_TABLET | Freq: Three times a day (TID) | ORAL | Status: DC | PRN
  Administered 2022-03-31: 5 mg via ORAL
  Filled 2022-03-31: qty 1

## 2022-03-31 MED ORDER — SODIUM CHLORIDE 0.9 % IV SOLN
2.0000 g | Freq: Once | INTRAVENOUS | Status: AC
  Administered 2022-03-31: 2 g via INTRAVENOUS
  Filled 2022-03-31: qty 20

## 2022-03-31 MED ORDER — ONDANSETRON HCL 4 MG/2ML IJ SOLN: 4.0000 mg | Freq: Four times a day (QID) | INTRAMUSCULAR | Status: DC | PRN

## 2022-03-31 MED ORDER — FERROUS SULFATE 325 (65 FE) MG PO TABS
325.0000 mg | ORAL_TABLET | Freq: Every day | ORAL | Status: DC
  Administered 2022-03-31 – 2022-04-01 (×2): 325 mg via ORAL
  Filled 2022-03-31 (×2): qty 1

## 2022-03-31 MED ORDER — SIMVASTATIN 20 MG PO TABS
40.0000 mg | ORAL_TABLET | Freq: Every day | ORAL | Status: DC
  Administered 2022-03-31: 40 mg via ORAL
  Filled 2022-03-31: qty 4

## 2022-03-31 MED ORDER — ONDANSETRON HCL 4 MG PO TABS: 4.0000 mg | ORAL_TABLET | Freq: Four times a day (QID) | ORAL | Status: DC | PRN

## 2022-03-31 MED ORDER — APIXABAN 5 MG PO TABS
10.0000 mg | ORAL_TABLET | Freq: Two times a day (BID) | ORAL | Status: DC
  Administered 2022-03-31 – 2022-04-01 (×3): 10 mg via ORAL
  Filled 2022-03-31 (×3): qty 2

## 2022-03-31 MED ORDER — SODIUM CHLORIDE 0.9 % IV SOLN
500.0000 mg | Freq: Every day | INTRAVENOUS | Status: DC
  Administered 2022-04-01: 500 mg via INTRAVENOUS
  Filled 2022-03-31: qty 500

## 2022-03-31 MED ORDER — OXYCODONE HCL 5 MG PO TABS: 10.0000 mg | ORAL_TABLET | ORAL | Status: DC | PRN

## 2022-03-31 MED ORDER — ACETAMINOPHEN 500 MG PO TABS: 500.0000 mg | ORAL_TABLET | Freq: Four times a day (QID) | ORAL | Status: DC | PRN

## 2022-03-31 MED ORDER — CELECOXIB 200 MG PO CAPS
200.0000 mg | ORAL_CAPSULE | Freq: Two times a day (BID) | ORAL | Status: DC
  Administered 2022-04-01: 200 mg via ORAL
  Filled 2022-03-31 (×2): qty 1

## 2022-03-31 MED ORDER — CARVEDILOL 3.125 MG PO TABS
3.1250 mg | ORAL_TABLET | Freq: Two times a day (BID) | ORAL | Status: DC
  Administered 2022-03-31: 3.125 mg via ORAL
  Filled 2022-03-31: qty 1

## 2022-03-31 MED ORDER — SODIUM CHLORIDE 0.9 % IV SOLN
500.0000 mg | Freq: Once | INTRAVENOUS | Status: AC
  Administered 2022-03-31: 500 mg via INTRAVENOUS
  Filled 2022-03-31: qty 5

## 2022-03-31 MED ORDER — MELATONIN 5 MG PO TABS
5.0000 mg | ORAL_TABLET | Freq: Every evening | ORAL | Status: DC | PRN
  Administered 2022-03-31: 5 mg via ORAL
  Filled 2022-03-31: qty 1

## 2022-03-31 MED ORDER — LOSARTAN POTASSIUM 50 MG PO TABS: 100.0000 mg | ORAL_TABLET | Freq: Every day | ORAL | Status: DC

## 2022-03-31 MED ORDER — DOCUSATE SODIUM 100 MG PO CAPS
100.0000 mg | ORAL_CAPSULE | Freq: Two times a day (BID) | ORAL | Status: DC
  Administered 2022-03-31 – 2022-04-01 (×2): 100 mg via ORAL
  Filled 2022-03-31 (×3): qty 1

## 2022-03-31 MED ORDER — PREDNISOLONE ACETATE 0.12 % OP SUSP
1.0000 [drp] | Freq: Every day | OPHTHALMIC | Status: DC
  Filled 2022-03-31: qty 5

## 2022-03-31 MED ORDER — IOHEXOL 350 MG/ML SOLN
75.0000 mL | Freq: Once | INTRAVENOUS | Status: AC | PRN
  Administered 2022-03-31: 75 mL via INTRAVENOUS

## 2022-03-31 MED ORDER — INSULIN ASPART 100 UNIT/ML IJ SOLN: 0.0000 [IU] | Freq: Three times a day (TID) | INTRAMUSCULAR | Status: DC

## 2022-03-31 MED ORDER — OXYCODONE HCL ER 10 MG PO T12A
20.0000 mg | EXTENDED_RELEASE_TABLET | Freq: Two times a day (BID) | ORAL | Status: DC
  Administered 2022-03-31 – 2022-04-01 (×3): 20 mg via ORAL
  Filled 2022-03-31 (×3): qty 2

## 2022-03-31 MED ORDER — PANTOPRAZOLE SODIUM 40 MG PO TBEC
40.0000 mg | DELAYED_RELEASE_TABLET | Freq: Every day | ORAL | Status: DC
  Administered 2022-03-31 – 2022-04-01 (×2): 40 mg via ORAL
  Filled 2022-03-31 (×2): qty 1

## 2022-03-31 MED ORDER — APIXABAN 5 MG PO TABS: 5.0000 mg | ORAL_TABLET | Freq: Two times a day (BID) | ORAL | Status: DC

## 2022-03-31 NOTE — ED Notes (Signed)
Pt found to be 88% on room air. Placed on 2 L of oxygen via nasal cannula. Pt denies home oxygen use. Daughter and Joni Fears at bedside.

## 2022-03-31 NOTE — Assessment & Plan Note (Signed)
Patient with a known history of adenocarcinoma of the lung who presents to the ER for evaluation of worsening shortness of breath and was found to be hypoxic with room air pulse oximetry of 76%. Patient was noted to be tachycardic upon presentation CT angiogram showed a single, small filling defect within a segmental branch of the right middle lobe pulmonary artery compatible with acute pulmonary embolus. Will start patient on Apixaban Continue oxygen supplementation to maintain pulse oximetry greater than 92% Obtain 2D echocardiogram to assess LVEF and rule out RV strain

## 2022-03-31 NOTE — ED Notes (Signed)
Msg'd MD that pt family is requesting they come and talk to them. The family is frustrated and confused about what is actually going on with the pt, and why the pt is having to stay overnight. Family stated they have been told multiple different reasons as to why they are here and need clarification.

## 2022-03-31 NOTE — ED Notes (Signed)
Pt cleared for PO fluids/foods by EDP [place]. Pt provided with water. Call bell within reach. Pt remains on VS monitor. Family at bedside

## 2022-03-31 NOTE — Assessment & Plan Note (Signed)
Continue PPI ?

## 2022-03-31 NOTE — Assessment & Plan Note (Deleted)
Appears to be multifactorial and secondary to adenocarcinoma of the lung, acute PE and left lower lobe pneumonia. Patient had room air pulse oximetry of 76% and is currently on 2 L of oxygen with improvement in her pulse oximetry to 98%. She will need home oxygen upon discharge

## 2022-03-31 NOTE — ED Notes (Signed)
US at bedside

## 2022-03-31 NOTE — ED Provider Notes (Signed)
[facility] Provider Note    Event Date/Time   First MD Initiated Contact with Patient 03/31/22 0542     (approximate)   History   Chief Complaint: Shortness of Breath   HPI  Amber Glenn is a 71 y.o. female with a history of diabetes, hypertension, stage IV lung cancer who comes ED complaining of shortness of breath, some chest tightness with deep breathing, oxygen saturation of 76% on room air on home pulse oximeter.  Does not use supplemental oxygen.  Denies chest pain or fever.     Physical Exam   Triage Vital Signs: ED Triage Vitals  Enc Vitals Group     BP 03/31/22 0512 (!) 131/50     Pulse Rate 03/31/22 0512 (!) 129     Resp 03/31/22 0512 18     Temp 03/31/22 0512 98.3 F (36.8 C)     Temp Source 03/31/22 0512 Oral     SpO2 03/31/22 0512 99 %     Weight 03/31/22 0514 119 lb (54 kg)     Height 03/31/22 0514 5' (1.524 m)     Head Circumference --      Peak Flow --      Pain Score --      Pain Loc --      Pain Edu? --      Excl. in [place]? --     Most recent vital signs: Vitals:   03/31/22 0630 03/31/22 0645  BP: (!) 119/55   Pulse: (!) 115 (!) 115  Resp: 18 20  Temp:    SpO2: 99% 97%    General: Awake, no distress.  CV:  Good peripheral perfusion.  Tachycardia heart rate 120 Resp:  Normal effort.  Clear to auscultation bilaterally Abd:  No distention.  Soft, nontender Other:  No lower extremity edema.   ED Results / Procedures / Treatments   Labs (all labs ordered are listed, but only abnormal results are displayed) Labs Reviewed  CBC WITH DIFFERENTIAL/PLATELET - Abnormal; Notable for the following components:      Result Value   WBC 12.1 (*)    Hemoglobin 10.5 (*)    HCT 35.5 (*)    MCH 24.9 (*)    MCHC 29.6 (*)    RDW 17.9 (*)    Neutro Abs 10.3 (*)    Lymphs Abs 0.6 (*)    All other components within normal limits  COMPREHENSIVE METABOLIC PANEL - Abnormal; Notable for the following components:   Sodium 134 (*)     Glucose, Bld 122 (*)    BUN 26 (*)    Total Protein 6.3 (*)    Albumin 3.2 (*)    All other components within normal limits  RESP PANEL BY RT-PCR (RSV, FLU A&B, COVID)  RVPGX2  TROPONIN I (HIGH SENSITIVITY)  TROPONIN I (HIGH SENSITIVITY)     EKG Interpreted by me Sinus tachycardia rate 125.  Left axis, normal QRS ST segments and T waves.  1 PVC on the strip   RADIOLOGY Chest x-ray interpreted by me, shows some streaking opacities in the left lower lung.   PROCEDURES:  .Critical Care  Performed by: Carrie Mew, MD Authorized by: Carrie Mew, MD   Critical care provider statement:    Critical care time (minutes):  35   Critical care time was exclusive of:  Separately billable procedures and treating other patients   Critical care was necessary to treat or prevent imminent or life-threatening deterioration of the following  conditions:  Respiratory failure   Critical care was time spent personally by me on the following activities:  Development of treatment plan with patient or surrogate, discussions with consultants, evaluation of patient's response to treatment, examination of patient, obtaining history from patient or surrogate, ordering and performing treatments and interventions, ordering and review of laboratory studies, ordering and review of radiographic studies, pulse oximetry, re-evaluation of patient's condition and review of old charts   Care discussed with: admitting provider      [place] ED: Medications  cefTRIAXone (ROCEPHIN) 2 g in sodium chloride 0.9 % 100 mL IVPB (2 g Intravenous New Bag/Given 03/31/22 0645)  azithromycin (ZITHROMAX) 500 mg in sodium chloride 0.9 % 250 mL IVPB (has no administration in time range)  iohexol (OMNIPAQUE) 350 MG/ML injection 75 mL (has no administration in time range)     IMPRESSION / MDM / [place] / ED COURSE  I reviewed the triage vital signs and the nursing notes.                               Differential diagnosis includes, but is not limited to, pneumonia, pleural effusion, pulmonary edema, viral illness, pulmonary embolism, non-STEMI  Patient's presentation is most consistent with acute presentation with potential threat to life or bodily function.  Patient presents with hypoxia and shortness of breath.  Will check labs, viral swab, chest x-ray.  If no obvious airspace disease, will need CT angiogram of the chest to rule out PE.  Will need to hospitalize for further management.  ----------------------------------------- 7:03 AM on 03/31/2022 ----------------------------------------- Chest x-ray does show some "moderate intensity atelectasis or infiltrate", not clearly diagnostic for the patient's degree of hypoxia and tachycardia and PE risk.  Will obtain CT angiogram of the chest to further evaluate.  Rocephin and azithromycin ordered.  She is not septic and notes that this degree of tachycardia is pretty typical for her.  Will discuss with hospitalist for further management.       FINAL CLINICAL IMPRESSION(S) / ED DIAGNOSES   Final diagnoses:  Acute respiratory failure with hypoxia ([place])  Community acquired pneumonia of left lower lobe of lung     Rx / DC Orders   ED Discharge Orders     None        Note:  This document was prepared using Dragon voice recognition software and may include unintentional dictation errors.   Carrie Mew, MD 03/31/22 [id number]

## 2022-03-31 NOTE — ED Provider Notes (Signed)
Care assumed of patient from outgoing provider.  See their note for initial history, exam and plan.  Clinical Course as of 03/31/22 0815  Wed Mar 31, 2022  0715 Admit for PNA on 2L  - CTA pending [SM]    Clinical Course User Index [SM] Nathaniel Man, MD   Discussed with radiology filling defect to subsegmental branch of the right middle lobe concerning for questionable pulmonary embolism, uncertain of clinical significance.  Recommended lower extremity duplex.  Evaluating CT scan independently concerning for multifocal pneumonia which is likely the etiology of her significant hypoxia.  After consultation with the hospitalist will order lower extremity duplex scans.  Admitted to Dr. Jacqulyn Ducking, MD 03/31/22 [id number]

## 2022-03-31 NOTE — ED Notes (Signed)
Pt ambulatory to rm 14. Pt placed on cardiac monitor. Call light within reach. Bed in lowest position. Dtr at bedside. Mel Almond, RN aware of pt being in rm.

## 2022-03-31 NOTE — Consult Note (Signed)
Hematology/Oncology Consult note [facility] Telephone:([phone] [phone]  Patient Care Team: Lynnell Jude, MD as PCP - General (Family Medicine) Telford Nab, RN as Oncology Nurse Navigator   Name of the patient: Amber Glenn  [id number]  1950-10-07    Reason for referral- ***   Referring physician- ***  Date of visit: @TODAY @   History of presenting illness- ***  ECOG PS- ***  Pain scale- ***   Review of systems- ROS  No Known Allergies  Patient Active Problem List   Diagnosis Date Noted   Hypoxia 03/31/2022   Acute pulmonary embolism ([place]) 03/31/2022   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 site [facility]) 03/03/2022   Metastatic adenocarcinoma ([place]) 02/18/2022   Acute low back pain 02/17/2022   Adenocarcinoma ([place]) 02/17/2022   Delirium due to another medical condition 02/17/2022   Palliative care encounter 02/17/2022   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   Neoplasm related pain 02/11/2022   Supraclavicular adenopathy 02/11/2022   Mediastinal mass 02/11/2022   Goals of care, counseling/discussion 02/11/2022   Pathologic fracture of lumbar vertebra, initial encounter 02/11/2022   Pathologic fracture 02/10/2022   Pyuria 02/10/2022   Left lower lobe pneumonia    Diarrhea    COVID-19 08/18/2019   Thumb pain 06/26/2015   Medicare annual wellness visit, subsequent 03/14/2015   Arthralgia 09/02/2014   Hot flashes 01/21/2014   Left shoulder pain 09/20/2013   Unspecified constipation 08/30/2012   Routine general medical examination at a health care facility 05/29/2012   Screening for colon cancer 05/29/2012   Hypertension 08/18/2011   Osteoarthritis 03/20/2009   Diabetes mellitus type 2, controlled ([place]) 11/14/2008   BASAL CELL CARCINOMA, FACE 12/06/2006   Hyperlipidemia 12/06/2006   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   ALLERGIC RHINITIS  12/06/2006   GERD 12/06/2006   DEGENERATIVE JOINT DISEASE, GENERALIZED 12/06/2006     Past Medical History:  Diagnosis Date   Arthritis    hands and feet   Cataract    right   Complication of anesthesia    sometimes have a hard time waking up   Diabetes mellitus    Borderline Diabetes   GERD (gastroesophageal reflux disease)    HTN (hypertension)    Hyperlipidemia    Motion sickness    Post corneal transplant    right     Past Surgical History:  Procedure Laterality Date   APPLICATION OF INTRAOPERATIVE CT SCAN  02/15/2022   Procedure: APPLICATION OF INTRAOPERATIVE CT SCAN;  Surgeon: Meade Maw, MD;  Location: [facility] ORS;  Service: Neurosurgery;;   BASAL CELL CARCINOMA EXCISION     Dr. Manley Mason   CARPAL TUNNEL RELEASE     right   COLONOSCOPY WITH PROPOFOL N/A 12/03/2015   Procedure: COLONOSCOPY WITH PROPOFOL;  Surgeon: Manya Silvas, MD;  Location: [facility] ENDOSCOPY;  Service: Endoscopy;  Laterality: N/A;   COLONOSCOPY WITH PROPOFOL N/A 02/22/2020   Procedure: COLONOSCOPY WITH PROPOFOL;  Surgeon: Lesly Rubenstein, MD;  Location: [facility] ENDOSCOPY;  Service: Endoscopy;  Laterality: N/A;   ESOPHAGOGASTRODUODENOSCOPY (EGD) WITH PROPOFOL N/A 02/22/2020   Procedure: ESOPHAGOGASTRODUODENOSCOPY (EGD) WITH PROPOFOL;  Surgeon: Lesly Rubenstein, MD;  Location: [facility] ENDOSCOPY;  Service: Endoscopy;  Laterality: N/A;   HAMMER TOE SURGERY Right 05/23/2019   Procedure: HAMMER TOE CORRECTION;  Surgeon: Samara Deist, DPM;  Location: [place];  Service: Podiatry;  Laterality: Right;  Diabetic   IR RADIOLOGIST EVAL & MGMT  03/24/2022  OSTECTOMY Right 05/23/2019   Procedure: DOUBLE OSTEOTOMY RIGHT;  Surgeon: Samara Deist, DPM;  Location: [place];  Service: Podiatry;  Laterality: Right;   SKIN CANCER EXCISION      Social History   Socioeconomic History   Marital status: Widowed    Spouse name: Not on file   Number of children: Not on file   Years of  education: Not on file   Highest education level: Not on file  Occupational History    Employer: [employer]    Comment: Currently not employed  Tobacco Use   Smoking status: Former    Types: Cigarettes    Quit date: 01/13/2009    Years since quitting: 13.2   Smokeless tobacco: Never  Substance and Sexual Activity   Alcohol use: No   Drug use: No   Sexual activity: Yes    Birth control/protection: Post-menopausal  Other Topics Concern   Not on file  Social History Narrative   Not on file   Social Determinants of Health   Financial Resource Strain: Not on file  Food Insecurity: No Food Insecurity (02/10/2022)   Hunger Vital Sign    Worried About Running Out of Food in the Last Year: Never true    Ran Out of Food in the Last Year: Never true  Transportation Needs: No Transportation Needs (02/10/2022)   PRAPARE - Hydrologist (Medical): No    Lack of Transportation (Non-Medical): No  Physical Activity: Not on file  Stress: Not on file  Social Connections: Not on file  Intimate Partner Violence: Not At Risk (02/10/2022)   Humiliation, Afraid, Rape, and Kick questionnaire    Fear of Current or Ex-Partner: No    Emotionally Abused: No    Physically Abused: No    Sexually Abused: No     Family History  Problem Relation Age of Onset   Heart disease Mother    Heart disease Father    Heart disease Maternal Grandmother    Heart disease Maternal Grandfather    Breast cancer Sister 17     Current Facility-Administered Medications:    acetaminophen (TYLENOL) tablet 500 mg, 500 mg, Oral, Q6H PRN, Agbata, Tochukwu, MD   apixaban (ELIQUIS) tablet 10 mg, 10 mg, Oral, BID, 10 mg at 03/31/22 1240 **FOLLOWED BY** [START ON 04/07/2022] apixaban (ELIQUIS) tablet 5 mg, 5 mg, Oral, BID, Agbata, Tochukwu, MD   ascorbic acid (VITAMIN C) tablet 1,000 mg, 1,000 mg, Oral, Daily, Agbata, Tochukwu, MD, 1,000 mg at 03/31/22 1239   [START ON 04/01/2022] azithromycin  (ZITHROMAX) 500 mg in sodium chloride 0.9 % 250 mL IVPB, 500 mg, Intravenous, Daily, Agbata, Tochukwu, MD   carvedilol (COREG) tablet 3.125 mg, 3.125 mg, Oral, BID WC, Agbata, Tochukwu, MD   [START ON 04/01/2022] cefTRIAXone (ROCEPHIN) 2 g in sodium chloride 0.9 % 100 mL IVPB, 2 g, Intravenous, Q24H, Agbata, Tochukwu, MD   celecoxib (CELEBREX) capsule 200 mg, 200 mg, Oral, BID, Agbata, Tochukwu, MD   cyclobenzaprine (FLEXERIL) tablet 5 mg, 5 mg, Oral, TID PRN, Agbata, Tochukwu, MD   docusate sodium (COLACE) capsule 100 mg, 100 mg, Oral, BID, Agbata, Tochukwu, MD, 100 mg at 03/31/22 1241   ferrous sulfate tablet 325 mg, 325 mg, Oral, Q1200, Agbata, Tochukwu, MD, 325 mg at 03/31/22 1239   gabapentin (NEURONTIN) capsule 300 mg, 300 mg, Oral, TID, Agbata, Tochukwu, MD, 300 mg at 03/31/22 1241   insulin aspart (novoLOG) injection 0-15 Units, 0-15 Units, Subcutaneous, TID WC, Agbata, Tochukwu, MD  LORazepam (ATIVAN) tablet 0.5 mg, 0.5 mg, Oral, Q8H PRN, Agbata, Tochukwu, MD   [START ON 04/01/2022] losartan (COZAAR) tablet 100 mg, 100 mg, Oral, Daily, Agbata, Tochukwu, MD   melatonin tablet 5 mg, 5 mg, Oral, QHS PRN, Agbata, Tochukwu, MD   multivitamin with minerals tablet 1 tablet, 1 tablet, Oral, Daily, Agbata, Tochukwu, MD, 1 tablet at 03/31/22 1238   ondansetron (ZOFRAN) tablet 4 mg, 4 mg, Oral, Q6H PRN **OR** ondansetron (ZOFRAN) injection 4 mg, 4 mg, Intravenous, Q6H PRN, Agbata, Tochukwu, MD   oxyCODONE (Oxy IR/ROXICODONE) immediate release tablet 10 mg, 10 mg, Oral, Q4H PRN, Agbata, Tochukwu, MD   oxyCODONE (OXYCONTIN) 12 hr tablet 20 mg, 20 mg, Oral, Q12H, Agbata, Tochukwu, MD, 20 mg at 03/31/22 1238   pantoprazole (PROTONIX) EC tablet 40 mg, 40 mg, Oral, Daily, Agbata, Tochukwu, MD, 40 mg at 03/31/22 1241   polyethylene glycol (MIRALAX / GLYCOLAX) packet 17 g, 17 g, Oral, Daily, Agbata, Tochukwu, MD, 17 g at 03/31/22 1245   [START ON 04/01/2022] prednisoLONE acetate (PRED MILD) 0.12 %  ophthalmic suspension 1 drop, 1 drop, Right Eye, Daily, Agbata, Tochukwu, MD   simvastatin (ZOCOR) tablet 40 mg, 40 mg, Oral, q1800, Agbata, Tochukwu, MD  Current Outpatient Medications:    acetaminophen (TYLENOL) 500 MG tablet, Take 500 mg by mouth every 6 (six) hours as needed for moderate pain, mild pain, fever or headache., Disp: , Rfl:    Ascorbic Acid (VITAMIN C) 1000 MG tablet, Take 1,000 mg by mouth daily.  , Disp: , Rfl:    carvedilol (COREG) 3.125 MG tablet, Take 3.125 mg by mouth 2 (two) times daily with a meal., Disp: , Rfl:    celecoxib (CELEBREX) 200 MG capsule, Take 1 capsule (200 mg total) by mouth 2 (two) times daily., Disp: 60 capsule, Rfl: 1   cyclobenzaprine (FLEXERIL) 5 MG tablet, Take 1 tablet (5 mg total) by mouth 3 (three) times daily as needed for muscle spasms., Disp: 30 tablet, Rfl: 0   Docusate Calcium (STOOL SOFTENER PO), Take 1 capsule by mouth daily as needed (constipation)., Disp: , Rfl:    Ferrous Sulfate (IRON) 325 (65 Fe) MG TABS, Take 1 tablet by mouth daily at 12 noon., Disp: , Rfl:    gabapentin (NEURONTIN) 300 MG capsule, Take 1 capsule (300 mg total) by mouth 3 (three) times daily., Disp: 90 capsule, Rfl: 2   LORazepam (ATIVAN) 0.5 MG tablet, Take 1 tablet (0.5 mg total) by mouth every 8 (eight) hours as needed for anxiety., Disp: 30 tablet, Rfl: 0   losartan (COZAAR) 100 MG tablet, Take 100 mg by mouth daily., Disp: , Rfl:    melatonin 5 MG TABS, Take 1 tablet (5 mg total) by mouth at bedtime as needed., Disp: 90 tablet, Rfl: 0   metFORMIN (GLUCOPHAGE) 500 MG tablet, TAKE 1 TABLET TWICE DAILY WITH A MEAL, Disp: 180 tablet, Rfl: 1   Multiple Vitamin (MULTIVITAMIN) capsule, Take 1 capsule by mouth daily.  , Disp: , Rfl:    nystatin (MYCOSTATIN) 100000 UNIT/ML suspension, Use as directed 5 mLs (500,000 Units total) in the mouth or throat 4 (four) times daily., Disp: 200 mL, Rfl: 0   omeprazole (PRILOSEC) 20 MG capsule, Take 1 capsule (20 mg total) by mouth  daily., Disp: 90 capsule, Rfl: 3   ondansetron (ZOFRAN) 8 MG tablet, Take 1 tablet (8 mg total) by mouth every 8 (eight) hours as needed for nausea or vomiting., Disp: 20 tablet, Rfl: 2   oxyCODONE ER (XTAMPZA  ER) 18 MG C12A, Take 1 capsule by mouth every 12 (twelve) hours., Disp: 30 capsule, Rfl: 0   Oxycodone HCl 20 MG TABS, Take 0.5-1 tablets (10-20 mg total) by mouth every 4 (four) hours as needed., Disp: 60 tablet, Rfl: 0   polyethylene glycol (MIRALAX / GLYCOLAX) 17 g packet, Take 17 g by mouth daily., Disp: 14 each, Rfl: 0   prednisoLONE acetate (PRED MILD) 0.12 % ophthalmic suspension, Place 1 drop into the right eye daily., Disp: , Rfl:    prochlorperazine (COMPAZINE) 10 MG tablet, Take 1 tablet (10 mg total) by mouth every 6 (six) hours as needed for nausea or vomiting., Disp: 30 tablet, Rfl: 2   simvastatin (ZOCOR) 40 MG tablet, TAKE 1 TABLET EVERY DAY  AT  6:00 PM, Disp: 90 tablet, Rfl: 3   predniSONE (DELTASONE) 20 MG tablet, Take 2 tablets (40mg ) x 3 days, then take 1 tablet (20mg ) x 3 days, then take 1/2 tablet (10mg ) x 1 week, then stop (Patient not taking: Reported on 03/31/2022), Disp: 20 tablet, Rfl: 0   sucralfate (CARAFATE) 1 g tablet, Take 1 tablet (1 g total) by mouth 4 (four) times daily -  with meals and at bedtime. (Patient not taking: Reported on 03/31/2022), Disp: 60 tablet, Rfl: 0   Physical exam:  Vitals:   03/31/22 1430 03/31/22 1445 03/31/22 1500 03/31/22 1515  BP: (!) 114/42  (!) 108/50   Pulse: (!) 105 (!) 103 (!) 107 (!) 103  Resp: 14 14 15 14   Temp:      TempSrc:      SpO2: 99% 98% 100% 100%  Weight:      Height:       Physical Exam        Latest Ref Rng & Units 03/31/2022    5:31 AM  CMP  Glucose 70 - 99 mg/dL 122   BUN 8 - 23 mg/dL 26   Creatinine 0.44 - 1.00 mg/dL 0.71   Sodium 135 - 145 mmol/L 134   Potassium 3.5 - 5.1 mmol/L 4.5   Chloride 98 - 111 mmol/L 101   CO2 22 - 32 mmol/L 26   Calcium 8.9 - 10.3 mg/dL 9.1   Total Protein 6.5 -  8.1 g/dL 6.3   Total Bilirubin 0.3 - 1.2 mg/dL 0.8   Alkaline Phos 38 - 126 U/L 74   AST 15 - 41 U/L 21   ALT 0 - 44 U/L 19       Latest Ref Rng & Units 03/31/2022    5:31 AM  CBC  WBC 4.0 - 10.5 K/uL 12.1   Hemoglobin 12.0 - 15.0 g/dL 10.5   Hematocrit 36.0 - 46.0 % 35.5   Platelets 150 - 400 K/uL 223     @IMAGES @  US Venous Img Lower Bilateral (DVT)  Result Date: 03/31/2022 CLINICAL DATA:  PEs.  Evaluate for DVT. EXAM: BILATERAL LOWER EXTREMITY VENOUS DOPPLER ULTRASOUND TECHNIQUE: Gray-scale sonography with graded compression, as well as color Doppler and duplex ultrasound were performed to evaluate the lower extremity deep venous systems from the level of the common femoral vein and including the common femoral, femoral, profunda femoral, popliteal and calf veins including the posterior tibial, peroneal and gastrocnemius veins when visible. The superficial great saphenous vein was also interrogated. Spectral Doppler was utilized to evaluate flow at rest and with distal augmentation maneuvers in the common femoral, femoral and popliteal veins. COMPARISON:  CTA PE, concurrent. FINDINGS: RIGHT LOWER EXTREMITY VENOUS Normal compressibility of the RIGHT  common femoral, superficial femoral, and popliteal veins, as well as the visualized calf veins. Visualized portions of profunda femoral vein and great saphenous vein unremarkable. No filling defects to suggest DVT on grayscale or color Doppler imaging. Doppler waveforms show normal direction of venous flow, normal respiratory plasticity and response to augmentation. OTHER No abnormal fluid collection. Heterogeneously-echogenic filling defect with incomplete coaptation within the imaged portion of the LEFT greater saphenous vein. Limitations: none LEFT LOWER EXTREMITY VENOUS Normal compressibility of the LEFT common femoral, superficial femoral, and popliteal veins, as well as the visualized calf veins. Visualized portions of profunda femoral vein  and great saphenous vein unremarkable. No filling defects to suggest DVT on grayscale or color Doppler imaging. Doppler waveforms show normal direction of venous flow, normal respiratory plasticity and response to augmentation. OTHER No evidence of superficial thrombophlebitis or abnormal fluid collection. Limitations: none IMPRESSION: 1. No evidence of femoropopliteal DVT within either lower extremity. 2. Examination is POSITIVE for superficial thrombophlebitis within the LEFT greater saphenous vein. Michaelle Birks, MD [facility] Electronically Signed   By: Michaelle Birks M.D.   On: 03/31/2022 09:34   CT Angio Chest PE W and/or Wo Contrast  Result Date: 03/31/2022 CLINICAL DATA:  Evaluate for pulmonary embolism. History of stage IV lung cancer. * Tracking Code: BO *. EXAM: CT ANGIOGRAPHY CHEST WITH CONTRAST TECHNIQUE: Multidetector CT imaging of the chest was performed using the standard protocol during bolus administration of intravenous contrast. Multiplanar CT image reconstructions and MIPs were obtained to evaluate the vascular anatomy. RADIATION DOSE REDUCTION: This exam was performed according to the departmental dose-optimization program which includes automated exposure control, adjustment of the mA and/or kV according to patient size and/or use of iterative reconstruction technique. CONTRAST:  64mL OMNIPAQUE IOHEXOL 350 MG/ML SOLN COMPARISON:  02/10/2022 FINDINGS: Cardiovascular: Exam detail is diminished due 1 to motion artifact. There is a single, small filling defect within a segmental branch of the right middle lobe pulmonary artery, image 42/7. Heart size appears normal. No pericardial effusion. Aortic atherosclerosis and coronary artery calcifications. Mediastinum/Nodes: Thyroid gland, trachea, and esophagus are unremarkable. Index anterior mediastinal, prevascular mass measures 3.2 x 3.2 cm, image 104/6. Previously 3.1 by 2.2 cm. Subcarinal  lymph node measures 1.4 cm, image 172/6. Previously 0.8 cm. Lungs/Pleura: Small left pleural effusion is new from previous exam. Progressive pleural thickening overlying the posterior left lower lobe. New heterogeneous ground-glass attenuation is scattered throughout both lungs. Progressive areas of atelectasis and architectural distortion noted within the left upper lobe and left lower lobe. Multiple new nodules are scattered throughout both lungs in worrisome for progression of disease. Index nodules include, including: Posteromedial left upper lobe nodule measures 7 mm, image 40/5. Posterolateral right upper lobe lung nodule measures 4 mm, image 30/5. Superior segment left lower lobe lung nodule measures 4 mm, image 54/5. Right lower lobe lung nodule measures 5 mm, image 69/5. Left lower lobe lung nodule measures 8 mm, image 70/5. Upper Abdomen: No acute abnormality. Musculoskeletal: No chest wall abnormality. No acute or significant osseous findings. Review of the MIP images confirms the above findings. IMPRESSION: 1. Exam detail is diminished due to motion artifact. There is a single, small filling defect within a segmental branch of the right middle lobe pulmonary artery compatible with acute pulmonary embolus. 2. Interval increase in size of anterior mediastinal soft tissue mass. There is also been increase in size of subcarinal lymph node. 3. Multiple new nodules are scattered throughout both lungs in worrisome  for pulmonary metastasis. 4. New small left pleural effusion with progressive pleural thickening overlying the posterior left lower lobe. 5. New heterogeneous ground-glass attenuation scattered throughout both lungs. Findings are nonspecific and may be inflammatory or infectious in etiology. 6. Progressive areas of atelectasis and architectural distortion noted within the left upper lobe and left lower lobe. 7. Coronary artery calcifications. 8.  Aortic Atherosclerosis (ICD10-I70.0). Critical  Value/emergent results were called by telephone at the time of interpretation on 03/31/2022 at 7:40 am to provider Dr. Jori Moll, who verbally acknowledged these results. Electronically Signed   By: Kerby Moors M.D.   On: 03/31/2022 07:41   DG Chest Portable 1 View  Result Date: 03/31/2022 CLINICAL DATA:  Shortness of breath. EXAM: PORTABLE CHEST 1 VIEW COMPARISON:  Aug 18, 2019 FINDINGS: The heart size and mediastinal contours are within normal limits. Low lung volumes are noted. Moderate severity linear atelectasis and/or infiltrate is seen within the left lung base. There is no evidence of a pleural effusion or pneumothorax. Postoperative changes are seen within the lower thoracic spine. IMPRESSION: Moderate severity left basilar linear atelectasis and/or infiltrate. Electronically Signed   By: Virgina Norfolk M.D.   On: 03/31/2022 06:00   DG Lumbar Spine Complete  Result Date: 03/30/2022 CLINICAL DATA:  Fall.  History of compression fracture EXAM: LUMBAR SPINE - COMPLETE 4+ VIEW COMPARISON:  Lumbar spine radiographs 03/08/2022. CT lumbar spine 03/16/2022 FINDINGS: Lytic lesion L1 vertebral body best seen on CT. This area is obscured by barium in the left colon on the lateral view. This is associated with L1 compression fracture. No new fracture identified. Bilateral pedicle screw and rod fusion T11 through L3. Hardware in satisfactory position. IMPRESSION: Lytic lesion L1 with pathologic fracture unchanged. Posterior fusion hardware in satisfactory position. No new fracture. Electronically Signed   By: Franchot Gallo M.D.   On: 03/30/2022 14:54   DG Thoracic Spine 2 View  Result Date: 03/30/2022 CLINICAL DATA:  Provided history: 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 site, unspecified laterality. Neoplasm related pain. EXAM: THORACIC SPINE 2 VIEWS COMPARISON:  CT of the thoracic spine 02/10/2022. FINDINGS: Mild Dextrocurvature of the upper thoracic spine. No significant  spondylolisthesis. No appreciable thoracic vertebral compression fracture. Thoracic spondylosis with multilevel disc space narrowing, degenerative endplate irregularity and degenerative endplate sclerosis. Posterior spinal fusion construct spanning the T11-L3 levels. Known pathologic L1 vertebral compression fracture better assessed on same day lumbar spine radiographs. Incidentally noted, there is residual contrast within the colon. Aortic atherosclerosis. IMPRESSION: 1. No appreciable thoracic vertebral compression fracture. 2. Mild Dextrocurvature of the upper thoracic spine, possibly positional. 3. Thoracic spondylosis, as described. 4. Posterior spinal fusion construct spanning the T11-L3 levels. Known pathologic L1 vertebral compression fracture better assessed on the same day lumbar spine radiographs. 5. Aortic Atherosclerosis (ICD10-I70.0). Electronically Signed   By: Kellie Simmering D.O.   On: 03/30/2022 14:01   DG THORACOLUMABAR SPINE  Result Date: 03/28/2022 CLINICAL DATA:  T11-L3 fusion.  Back pain. EXAM: THORACOLUMBAR SPINE 1V COMPARISON:  Lumbar spine CT 03/16/2022 FINDINGS: There is dense contrast material throughout the entire colon which limits evaluation, particularly on the lateral view. Posterior fusion rods are seen bilaterally with transpedicular screws at T11, T12, L2 and L3, unchanged in position. There is no evidence for hardware loosening. Lucent lesions throughout the L1 vertebral body are grossly unchanged. Alignment is grossly stable with grade 1 anterolisthesis at L4-L5. IMPRESSION: 1. Stable T11-L3 posterior fusion. No evidence for hardware loosening. 2. Lucent lesions throughout the L1  vertebral body are grossly unchanged. Electronically Signed   By: Ronney Asters M.D.   On: 03/28/2022 00:21   IR Radiologist Eval & Mgmt  Result Date: 03/24/2022 EXAM: NEW PATIENT OFFICE VISIT CHIEF COMPLAINT: See below HISTORY OF PRESENT ILLNESS: See below REVIEW OF SYSTEMS: See below PHYSICAL  EXAMINATION: See below ASSESSMENT AND PLAN: Please refer to completed note in the electronic medical record on [place] Mug[place]u, MD [facility] Radiology Electronically Signed   By: Michaelle Birks M.D.   On: 03/24/2022 13:32   DG ESOPHAGUS W DOUBLE CM (HD)  Result Date: 03/24/2022 CLINICAL DATA:  Dysphagia, lung cancer EXAM: ESOPHOGRAM / BARIUM SWALLOW / BARIUM TABLET STUDY TECHNIQUE: Combined double contrast and single contrast examination performed using effervescent crystals, thick barium liquid, and thin barium liquid. The patient was observed with fluoroscopy swallowing a 13 mm barium sulphate tablet. FLUOROSCOPY: Radiation Exposure Index (as provided by the fluoroscopic device): 5 mGy COMPARISON:  None Available. FINDINGS: Normal pharyngeal anatomy and motility. Contrast flowed freely through the esophagus without evidence of a mass. Normal esophageal mucosa without evidence of irregularity or ulceration. Mild tertiary contractions of the esophagus as can be seen with mild spasm. Mild gastroesophageal reflux. Mild relative narrowing of the distal esophagus just proximal to the gastroesophageal junction concerning for a mild stricture restricting the passage of a 13 mm barium tablet. No definite hiatal hernia was demonstrated. IMPRESSION: 1. Mild relative narrowing of the distal esophagus just proximal to the gastroesophageal junction concerning for a mild stricture restricting the passage of a 13 mm barium tablet. 2. Mild tertiary contractions of the esophagus as can be seen with mild spasm. Electronically Signed   By: Kathreen Devoid M.D.   On: 03/24/2022 10:17   CT Lumbar Spine Wo Contrast  Result Date: 03/16/2022 CLINICAL DATA:  Musculoskeletal neoplasm. Persistent back pain. Evaluate for feasibility of RFA. EXAM: CT LUMBAR SPINE WITHOUT CONTRAST TECHNIQUE: Multidetector CT imaging of the lumbar spine was performed without intravenous contrast  administration. Multiplanar CT image reconstructions were also generated. RADIATION DOSE REDUCTION: This exam was performed according to the departmental dose-optimization program which includes automated exposure control, adjustment of the mA and/or kV according to patient size and/or use of iterative reconstruction technique. COMPARISON:  CT lumbar spine dated February 10, 2022 FINDINGS: Segmentation: 5 lumbar type vertebrae. Alignment: Grade 1 anterolisthesis of L4. Vertebrae: Pathological fracture of L1 vertebral body with multiple lucencies involving the vertebral bodies and bilateral pedicles is again noted. Persistent approximately 5 mm bulge of the vertebral body into the spinal canal. Posterior spinal fusion with bipedicular screws from T11-L3, sparing the L1. Paraspinal and other soft tissues: Atherosclerotic calcification of abdominal aorta and branch vessels. No acute abnormality. Disc levels: T12-L1: No significant disc bulge, spinal canal or neural foraminal stenosis. L1-L2: No significant disc bulge, spinal canal or neural foraminal stenosis. L2-L3: No significant disc bulge, spinal canal or neural foraminal stenosis. L3-L4: Disc height loss and disc osteophyte complex with mild disc bulge. Mild bilateral facet joint arthropathy. Mild narrowing of lateral recesses bilaterally. No significant neural foraminal stenosis. L4-L5: Anterolisthesis of L4. Broad-based disc bulge and moderate bilateral facet joint arthropathy. Narrowing of lateral recesses bilaterally. Mild bilateral neural foraminal stenosis. L5-S1: Broad-based disc bulge and moderate facet joint arthropathy. Mild bilateral lateral recess stenosis. No significant neural foraminal stenosis IMPRESSION: 1. Pathological fracture of L1 vertebral body with multiple lucencies involving the vertebral bodies and bilateral pedicles is again noted. Persistent approximately 5 mm  bulge of the vertebral body into the spinal canal. 2. Posterior spinal fusion  with bipedicular screws from T11-L3, sparing the L1. 3. Multilevel degenerative disc disease with disc osteophyte complex at L3-L4, L4-L5 and L5-S1 with narrowing of lateral recesses bilaterally at L3-L4, L4-L5 and L5-S1. 4. Aortic atherosclerosis. Aortic Atherosclerosis (ICD10-I70.0). Electronically Signed   By: Keane Police D.O.   On: 03/16/2022 11:35   DG Pelvis 1-2 Views  Result Date: 03/08/2022 CLINICAL DATA:  Fall 6 days ago.  Left lower back and hip pain. EXAM: PELVIS - 1-2 VIEW COMPARISON:  CT abdomen and pelvis 01/02/2013 FINDINGS: Mildly decreased bone mineralization. The bilateral sacroiliac, femoroacetabular, and pubic symphysis joint spaces are maintained. No acute fracture is seen. No dislocation. Moderate right L4-5 disc space narrowing. IMPRESSION: 1. No acute fracture is seen. 2. Moderate right L4-5 disc space narrowing. Electronically Signed   By: Yvonne Kendall M.D.   On: 03/08/2022 11:46   DG Lumbar Spine 2-3 Views  Result Date: 03/08/2022 CLINICAL DATA:  Fall Tuesday night 03/02/2022. Left lower back and hip pain. Back surgery 02/15/2022 for L1 fracture. EXAM: LUMBAR SPINE - 2-3 VIEW COMPARISON:  CT lumbar spine 02/10/2022, MRI lumbar spine 02/10/2022 FINDINGS: There is again a moderate L1 vertebral body compression fracture. Moderate approximately 5 mm retropulsion of the posterosuperior aspect of the L 1 vertebral body is unchanged from 02/10/2022 CT and MRI. Interval bilateral transpedicular screws placed at T11, T12, L2, and L3, with bilateral posterior interconnecting rods. Minimal dextrocurvature centered at T12 is unchanged from prior. 6 mm grade 1 anterolisthesis of L4 on L5, unchanged from prior. Moderate L3-4 and L4-5 and mild T9-10 through T11-12 disc space narrowing. IMPRESSION: Compared to 02/10/2022: 1. Moderate L1 vertebral body compression fracture, unchanged from 02/10/2022 CT and MRI. 2. Interval bilateral posterior fusion from T11-L3 except for the L1 vertebral body  level, treatment for the prior L1 compression fracture. 3. Unchanged grade 1 anterolisthesis of L4 on L5. Electronically Signed   By: Yvonne Kendall M.D.   On: 03/08/2022 11:45    Assessment and plan- Patient is a 71 y.o. female ***   Thank you for this kind referral and the opportunity to participate in the care of this  Patient   Visit Diagnosis 1. Acute respiratory failure with hypoxia ([place])   2. Community acquired pneumonia of left lower lobe of lung   3. Pulmonary embolism, other, unspecified chronicity, unspecified whether acute cor pulmonale present ([place])     Dr. Randa Evens, MD, MPH [facility] [phone] 03/31/2022

## 2022-03-31 NOTE — ED Notes (Signed)
Dr. Francine Graven to bedside to further explain admission and POC to pt. And family.

## 2022-03-31 NOTE — Assessment & Plan Note (Signed)
Hold metformin Maintain consistent carbohydrate diet Glycemic control with sliding scale insulin

## 2022-03-31 NOTE — H&P (Signed)
History and Physical    Patient: Amber Glenn [id number] DOB: May 18, 1950 DOA: 03/31/2022 DOS: the patient was seen and examined on 03/31/2022 PCP: Lynnell Jude, MD  Patient coming from: Home  Chief Complaint:  Chief Complaint  Patient presents with   Shortness of Breath   HPI: Amber Glenn is a 71 y.o. female with medical history significant for recently diagnosed primary lung cancer after she presented to the ER for evaluation of significant low back pain an MRI of the lumbar spine showed a pathologic fracture of the L1 vertebral body with posterior bulging of the cortex resulting in mild spinal canal stenosis.  Diffuse T1 hypointense abnormality in the L1 vertebral body with involvement of bilateral pedicles and transverse processes. She had a CT scan of the chest/abdomen/pelvis with contrast which showed a 1 cm left supraclavicular lymph node, 1 cm right subcarinal lymph node, necrotic 3 x 1 x 2 0.4 x 2.9 cm anterior mediastinal mass in the left prevascular region with mild mass effect in the brachiocephalic vein. She had a biopsy of the left supraclavicular lymph node which was consistent with primary adenocarcinoma of the lung. Patient is status post percutaneous fixation of the L1 vertebral body fracture and completed palliative radiation of her spine.  She is scheduled for ablation therapy for further pain control of her back pain 04/02/22. Per oncology notes, patient does not wish to proceed with any systemic treatment for her known metastatic adenocarcinoma of the lung until her back pain is well-controlled. She presents to the ER by private vehicle for evaluation of worsening shortness of breath, nonproductive cough and pulse oximetry of 76%.  She also notes intermittent difficulty swallowing to take her pills with applesauce.   She continues to have low back pain but states that it is improved. She denies having any fever, no chills, no dizziness, no lightheadedness, no  headache, no abdominal pain, no urinary symptoms, no blurred vision or focal deficit. In the ER she was noted to be tachycardic with heart rate in the 110s.  She was placed on 2 L of oxygen with improvement in her pulse oximetry to 98%. She had a CT angiogram of the chest which showed a single, small filling defect within a segmental branch of the right middle lobe pulmonary artery compatible with acute pulmonary embolus. Interval increase in size of anterior mediastinal soft tissue mass. There is also been increase in size of subcarinal lymph node. Multiple new nodules are scattered throughout both lungs in worrisome for pulmonary metastasis. New small left pleural effusion with progressive pleural thickening overlying the posterior left lower lobe. New heterogeneous ground-glass attenuation scattered throughout both lungs. Findings are nonspecific and may be inflammatory or infectious in etiology. Progressive areas of atelectasis and architectural distortion noted within the left upper lobe and left lower lobe. Coronary artery calcifications. Aortic Atherosclerosis. She will be admitted to the hospital for further evaluation.     Review of Systems: As mentioned in the history of present illness. All other systems reviewed and are negative. Past Medical History:  Diagnosis Date   Arthritis    hands and feet   Cataract    right   Complication of anesthesia    sometimes have a hard time waking up   Diabetes mellitus    Borderline Diabetes   GERD (gastroesophageal reflux disease)    HTN (hypertension)    Hyperlipidemia    Motion sickness    Post corneal transplant    right   Past  Surgical History:  Procedure Laterality Date   APPLICATION OF INTRAOPERATIVE CT SCAN  02/15/2022   Procedure: APPLICATION OF INTRAOPERATIVE CT SCAN;  Surgeon: Meade Maw, MD;  Location: [facility] ORS;  Service: Neurosurgery;;   BASAL CELL CARCINOMA EXCISION     Dr. Manley Mason   CARPAL TUNNEL RELEASE     right    COLONOSCOPY WITH PROPOFOL N/A 12/03/2015   Procedure: COLONOSCOPY WITH PROPOFOL;  Surgeon: Manya Silvas, MD;  Location: [facility] ENDOSCOPY;  Service: Endoscopy;  Laterality: N/A;   COLONOSCOPY WITH PROPOFOL N/A 02/22/2020   Procedure: COLONOSCOPY WITH PROPOFOL;  Surgeon: Lesly Rubenstein, MD;  Location: [facility] ENDOSCOPY;  Service: Endoscopy;  Laterality: N/A;   ESOPHAGOGASTRODUODENOSCOPY (EGD) WITH PROPOFOL N/A 02/22/2020   Procedure: ESOPHAGOGASTRODUODENOSCOPY (EGD) WITH PROPOFOL;  Surgeon: Lesly Rubenstein, MD;  Location: [facility] ENDOSCOPY;  Service: Endoscopy;  Laterality: N/A;   HAMMER TOE SURGERY Right 05/23/2019   Procedure: HAMMER TOE CORRECTION;  Surgeon: Samara Deist, DPM;  Location: [place];  Service: Podiatry;  Laterality: Right;  Diabetic   IR RADIOLOGIST EVAL & MGMT  03/24/2022   OSTECTOMY Right 05/23/2019   Procedure: DOUBLE OSTEOTOMY RIGHT;  Surgeon: Samara Deist, DPM;  Location: [place];  Service: Podiatry;  Laterality: Right;   SKIN CANCER EXCISION     Social History:  reports that she quit smoking about 13 years ago. Her smoking use included cigarettes. She has never used smokeless tobacco. She reports that she does not drink alcohol and does not use drugs.  No Known Allergies  Family History  Problem Relation Age of Onset   Heart disease Mother    Heart disease Father    Heart disease Maternal Grandmother    Heart disease Maternal Grandfather    Breast cancer Sister 22    Prior to Admission medications   Medication Sig Start Date End Date Taking? Authorizing Provider  acetaminophen (TYLENOL) 500 MG tablet Take 500 mg by mouth every 6 (six) hours as needed for moderate pain, mild pain, fever or headache.   Yes [provider]  Ascorbic Acid (VITAMIN C) 1000 MG tablet Take 1,000 mg by mouth daily.     Yes [provider]  carvedilol (COREG) 3.125 MG tablet Take 3.125 mg by mouth 2 (two) times daily with a meal. 12/03/21  12/03/22 Yes [provider]  celecoxib (CELEBREX) 200 MG capsule Take 1 capsule (200 mg total) by mouth 2 (two) times daily. 03/26/22  Yes Borders, Kirt Boys, NP  cyclobenzaprine (FLEXERIL) 5 MG tablet Take 1 tablet (5 mg total) by mouth 3 (three) times daily as needed for muscle spasms. 03/26/22  Yes Borders, Kirt Boys, NP  Docusate Calcium (STOOL SOFTENER PO) Take 1 capsule by mouth daily as needed (constipation).   Yes [provider]  Ferrous Sulfate (IRON) 325 (65 Fe) MG TABS Take 1 tablet by mouth daily at 12 noon.   Yes [provider]  gabapentin (NEURONTIN) 300 MG capsule Take 1 capsule (300 mg total) by mouth 3 (three) times daily. 03/23/22  Yes Borders, Kirt Boys, NP  LORazepam (ATIVAN) 0.5 MG tablet Take 1 tablet (0.5 mg total) by mouth every 8 (eight) hours as needed for anxiety. 03/30/22  Yes Borders, Kirt Boys, NP  losartan (COZAAR) 100 MG tablet Take 100 mg by mouth daily. 12/03/21 12/03/22 Yes [provider]  melatonin 5 MG TABS Take 1 tablet (5 mg total) by mouth at bedtime as needed. 02/18/22 05/19/22 Yes Richarda Osmond, MD  metFORMIN (GLUCOPHAGE) 500  MG tablet TAKE 1 TABLET TWICE DAILY WITH A MEAL 05/27/15  Yes Jackolyn Confer, MD  Multiple Vitamin (MULTIVITAMIN) capsule Take 1 capsule by mouth daily.     Yes [provider]  nystatin (MYCOSTATIN) 100000 UNIT/ML suspension Use as directed 5 mLs (500,000 Units total) in the mouth or throat 4 (four) times daily. 03/09/22  Yes Borders, Kirt Boys, NP  omeprazole (PRILOSEC) 20 MG capsule Take 1 capsule (20 mg total) by mouth daily. 12/20/14  Yes Jackolyn Confer, MD  ondansetron (ZOFRAN) 8 MG tablet Take 1 tablet (8 mg total) by mouth every 8 (eight) hours as needed for nausea or vomiting. 03/11/22  Yes Borders, Kirt Boys, NP  oxyCODONE ER (XTAMPZA ER) 18 MG C12A Take 1 capsule by mouth every 12 (twelve) hours. 03/15/22  Yes Borders, Kirt Boys, NP  Oxycodone HCl 20 MG TABS Take 0.5-1 tablets  (10-20 mg total) by mouth every 4 (four) hours as needed. 03/23/22  Yes Borders, Kirt Boys, NP  polyethylene glycol (MIRALAX / GLYCOLAX) 17 g packet Take 17 g by mouth daily. 02/18/22  Yes Richarda Osmond, MD  prednisoLONE acetate (PRED MILD) 0.12 % ophthalmic suspension Place 1 drop into the right eye daily.   Yes [provider]  prochlorperazine (COMPAZINE) 10 MG tablet Take 1 tablet (10 mg total) by mouth every 6 (six) hours as needed for nausea or vomiting. 03/11/22  Yes Borders, Kirt Boys, NP  simvastatin (ZOCOR) 40 MG tablet TAKE 1 TABLET EVERY DAY  AT  6:00 PM 12/20/14  Yes Jackolyn Confer, MD  predniSONE (DELTASONE) 20 MG tablet Take 2 tablets (40mg ) x 3 days, then take 1 tablet (20mg ) x 3 days, then take 1/2 tablet (10mg ) x 1 week, then stop Patient not taking: Reported on 03/31/2022 03/15/22   Borders, Kirt Boys, NP  sucralfate (CARAFATE) 1 g tablet Take 1 tablet (1 g total) by mouth 4 (four) times daily -  with meals and at bedtime. Patient not taking: Reported on 03/31/2022 03/18/22   [name], NP    Physical Exam: Vitals:   03/31/22 0615 03/31/22 0630 03/31/22 0645 03/31/22 1104  BP:  (!) 119/55  (!) 120/53  Pulse: (!) 120 (!) 115 (!) 115 (!) 108  Resp: 20 18 20 19   Temp:    97.9 F (36.6 C)  TempSrc:    Oral  SpO2: 98% 99% 97% 100%  Weight:      Height:       Physical Exam Vitals and nursing note reviewed.  Constitutional:      Comments: Chronically ill-appearing  HENT:     Head: Normocephalic and atraumatic.     Mouth/Throat:     Mouth: Mucous membranes are moist.  Eyes:     Pupils: Pupils are equal, round, and reactive to light.  Cardiovascular:     Rate and Rhythm: Tachycardia present.  Pulmonary:     Effort: Tachypnea present.     Comments: Scattered rhonchi in both lung bases Abdominal:     General: Bowel sounds are normal.     Palpations: Abdomen is soft.  Musculoskeletal:        General: Normal range of motion.     Cervical back:  Normal range of motion and neck supple.  Skin:    General: Skin is warm and dry.  Neurological:     General: No focal deficit present.     Mental Status: She is alert.  Psychiatric:  Mood and Affect: Mood normal.        Behavior: Behavior normal.     Data Reviewed: Relevant notes from primary care and specialist visits, past discharge summaries as available in EHR, including Care Everywhere. Prior diagnostic testing as pertinent to current admission diagnoses Updated medications and problem lists for reconciliation ED course, including vitals, labs, imaging, treatment and response to treatment Triage notes, nursing and pharmacy notes and ED provider's notes Notable results as noted in HPI Labs reviewed.  Sodium 134, potassium 4.5, chloride 101, bicarb 26, glucose 122, BUN 26, creatinine 0.71, calcium 9.1, total protein 6.3, albumin 3.2, AST 21, ALT 19, alkaline phosphatase 74, total bilirubin 0.8, white count 12.1, hemoglobin 10.5, hematocrit 35.5, platelet count 223 Respiratory Viral panel is negative X-ray of thoracic spine shows no appreciable thoracic vertebral compression fracture. Mild Dextrocurvature of the upper thoracic spine, possibly positional. Thoracic spondylosis. Posterior spinal fusion construct spanning the T11-L3 levels. Known pathologic L1 vertebral compression fracture better assessed on the same day lumbar spine radiographs. Aortic Atherosclerosis . Lumbar spine x-ray shows Lytic lesion L1 with pathologic fracture unchanged. Posterior fusion hardware in satisfactory position. No new fracture. Chest x-ray reviewed by me shows Moderate severity left basilar linear atelectasis and/or infiltrate.  Bilateral lower extremity venous Doppler shows no evidence of femoropopliteal DVT within either lower extremity. Examination is POSITIVE for superficial thrombophlebitis within the LEFT greater saphenous vein. Twelve-lead EKG reviewed by me shows sinus tachycardia with  occasional PVCs There are no new results to review at this time.  Assessment and Plan: * Hypoxia Appears to be multifactorial and secondary to adenocarcinoma of the lung, acute PE and left lower lobe pneumonia. Patient had room air pulse oximetry of 76% and is currently on 2 L of oxygen with improvement in her pulse oximetry to 98%. She will need home oxygen upon discharge  Acute pulmonary embolism ([place]) Patient with a known history of adenocarcinoma of the lung who presents to the ER for evaluation of worsening shortness of breath and was found to be hypoxic with room air pulse oximetry of 76%. Patient was noted to be tachycardic upon presentation CT angiogram showed a single, small filling defect within a segmental branch of the right middle lobe pulmonary artery compatible with acute pulmonary embolus. 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  Patient with L1 pathologic fracture status post radiation therapy and percutaneous fixation Follow-up with oncology as an outpatient   Left lower lobe pneumonia Treat patient empirically with Rocephin and Zithromax  Hypertension Continue Coreg and losartan  GERD Continue PPI  Diabetes mellitus type 2, controlled ([place]) Hold metformin Maintain consistent carbohydrate diet Glycemic control with sliding scale insulin      Advance Care Planning:   Code Status: Full Code   Consults: None  Family Communication: Greater than 50% of time was spent discussing patient's condition and plan of care with patient and her daughter at the bedside.  All questions and concerns have been addressed.  They verbalized understanding and agree with the plan.  CODE STATUS was  discussed and she wishes to be a full code.  Severity of Illness: The appropriate patient status for this patient is INPATIENT. Inpatient status is judged to be reasonable and  necessary in order to provide the required intensity of service to ensure the patient's safety. The patient's presenting symptoms, physical exam findings, and initial radiographic and laboratory data in the context of their chronic comorbidities is felt to place them at high risk for further clinical deterioration. Furthermore, it is not anticipated that the patient will be medically stable for discharge from the hospital within 2 midnights of admission.   * I certify that at the point of admission it is my clinical judgment that the patient will require inpatient hospital care spanning beyond 2 midnights from the point of admission due to high intensity of service, high risk for further deterioration and high frequency of surveillance required.*  Author: Collier Bullock, MD 03/31/2022 12:30 PM  For on call review [email or web address].

## 2022-03-31 NOTE — ED Triage Notes (Addendum)
Patient ambulatory to triage with steady gait, without difficulty or distress noted; pt reports O2 sats 76% on ra at home; no hx oxygen dependency at home; stage IV lung CA, radiation completed wk ago; pt reports [facility] tonight, but no accomp symptoms

## 2022-03-31 NOTE — Assessment & Plan Note (Signed)
Continue metoprolol. 

## 2022-03-31 NOTE — Assessment & Plan Note (Signed)
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

## 2022-03-31 NOTE — Assessment & Plan Note (Signed)
Treat patient empirically with Rocephin and Zithromax

## 2022-03-31 NOTE — ED Notes (Signed)
XR at bedside

## 2022-03-31 NOTE — ED Notes (Signed)
While administering pt's lunchtime meds the pt and family did not understand why it was going to take so long to get discharged? This RN explained that per the MD it was due to the home oxygen needs, and again the pt and family expressed concerns about why it would take that long to get set up.

## 2022-03-31 NOTE — TOC Benefit Eligibility Note (Signed)
Patient Advocate Encounter  Insurance verification completed.    The patient is currently admitted and upon discharge could be taking Eliquis 5 mg.  The current 30 day co-pay is $45.00.   The patient is insured through [employer] Part D   Alphonsus Doyel, CPHT Pharmacy Patient Advocate Specialist [facility] Patient Advocate Team Direct Number: [phone]  Fax: [phone]       

## 2022-03-31 NOTE — ED Notes (Signed)
Patient transported to CT 

## 2022-04-01 ENCOUNTER — Observation Stay (HOSPITAL_BASED_OUTPATIENT_CLINIC_OR_DEPARTMENT_OTHER)
Admit: 2022-04-01 | Discharge: 2022-04-01 | Disposition: A | Discharge status: Home / Self Care | Payer: Medicare HMO | Attending: Internal Medicine | Admitting: Internal Medicine

## 2022-04-01 ENCOUNTER — Other Ambulatory Visit (HOSPITAL_COMMUNITY): Payer: Self-pay

## 2022-04-01 DIAGNOSIS — I10 Essential (primary) hypertension: Secondary | ICD-10-CM

## 2022-04-01 DIAGNOSIS — J189 Pneumonia, unspecified organism: Secondary | ICD-10-CM

## 2022-04-01 DIAGNOSIS — J9621 Acute and chronic respiratory failure with hypoxia: Principal | ICD-10-CM

## 2022-04-01 DIAGNOSIS — I2699 Other pulmonary embolism without acute cor pulmonale: Secondary | ICD-10-CM

## 2022-04-01 DIAGNOSIS — I2609 Other pulmonary embolism with acute cor pulmonale: Secondary | ICD-10-CM

## 2022-04-01 DIAGNOSIS — I809 Phlebitis and thrombophlebitis of unspecified site: Secondary | ICD-10-CM | POA: Insufficient documentation

## 2022-04-01 DIAGNOSIS — C3490 Malignant neoplasm of unspecified part of unspecified bronchus or lung: Secondary | ICD-10-CM

## 2022-04-01 DIAGNOSIS — K219 Gastro-esophageal reflux disease without esophagitis: Secondary | ICD-10-CM

## 2022-04-01 DIAGNOSIS — E119 Type 2 diabetes mellitus without complications: Secondary | ICD-10-CM

## 2022-04-01 DIAGNOSIS — I8002 Phlebitis and thrombophlebitis of superficial vessels of left lower extremity: Secondary | ICD-10-CM

## 2022-04-01 LAB — ECHOCARDIOGRAM COMPLETE
AR max vel: 2.16 cm2
AV Area VTI: 2.39 cm2
AV Area mean vel: 2.28 cm2
AV Mean grad: 3 mmHg
AV Peak grad: 5.3 mmHg
Ao pk vel: 1.15 m/s
Area-P 1/2: 6.87 cm2
Height: 60 in
S' Lateral: 3.2 cm
Weight: 1904 oz

## 2022-04-01 LAB — GLUCOSE, CAPILLARY: Glucose-Capillary: 100 mg/dL — ABNORMAL HIGH (ref 70–99)

## 2022-04-01 MED ORDER — NYSTATIN 100000 UNIT/ML MT SUSP: 5.0000 mL | Freq: Four times a day (QID) | OROMUCOSAL | 0 refills | Status: DC

## 2022-04-01 MED ORDER — METOPROLOL TARTRATE 25 MG PO TABS
25.0000 mg | ORAL_TABLET | Freq: Two times a day (BID) | ORAL | Status: DC
  Administered 2022-04-01: 25 mg via ORAL
  Filled 2022-04-01: qty 1

## 2022-04-01 MED ORDER — METOPROLOL TARTRATE 25 MG PO TABS: 25.0000 mg | ORAL_TABLET | Freq: Two times a day (BID) | ORAL | 0 refills | Status: DC

## 2022-04-01 MED ORDER — CEFDINIR 300 MG PO CAPS: 300.0000 mg | ORAL_CAPSULE | Freq: Two times a day (BID) | ORAL | 0 refills | Status: AC

## 2022-04-01 MED ORDER — AZITHROMYCIN 250 MG PO TABS: ORAL_TABLET | ORAL | 0 refills | Status: DC

## 2022-04-01 MED ORDER — ORAL CARE MOUTH RINSE: 15.0000 mL | OROMUCOSAL | Status: DC | PRN

## 2022-04-01 MED ORDER — APIXABAN 5 MG PO TABS: ORAL_TABLET | ORAL | 0 refills | Status: DC

## 2022-04-01 NOTE — Assessment & Plan Note (Signed)
The patient qualifies for home oxygen with pulse ox of 85% with ambulation.  Initially had a pulse ox of 76%.  Likely combination of lung cancer, pneumonia and pulmonary embolism.

## 2022-04-01 NOTE — Plan of Care (Signed)

## 2022-04-01 NOTE — Plan of Care (Signed)
  Problem: Education: Goal: Knowledge of General Education information will improve Description: Including pain rating scale, medication(s)/side effects and non-pharmacologic comfort measures 04/01/2022 0527 by Fernande Bras, RN Outcome: Progressing 04/01/2022 0345 by Fernande Bras, RN Outcome: Progressing   Problem: Health Behavior/Discharge Planning: Goal: Ability to manage health-related needs will improve 04/01/2022 0527 by Fernande Bras, RN Outcome: Progressing 04/01/2022 0345 by Fernande Bras, RN Outcome: Progressing   Problem: Clinical Measurements: Goal: Ability to maintain clinical measurements within normal limits will improve 04/01/2022 0527 by Fernande Bras, RN Outcome: Progressing 04/01/2022 0345 by Fernande Bras, RN Outcome: Progressing Goal: Will remain free from infection 04/01/2022 0527 by Fernande Bras, RN Outcome: Progressing 04/01/2022 0345 by Fernande Bras, RN Outcome: Progressing Goal: Diagnostic test results will improve 04/01/2022 0527 by Fernande Bras, RN Outcome: Progressing 04/01/2022 0345 by Fernande Bras, RN Outcome: Progressing Goal: Respiratory complications will improve 04/01/2022 0527 by Fernande Bras, RN Outcome: Progressing 04/01/2022 0345 by Fernande Bras, RN Outcome: Progressing Goal: Cardiovascular complication will be avoided 04/01/2022 0527 by Fernande Bras, RN Outcome: Progressing 04/01/2022 0345 by Fernande Bras, RN Outcome: Progressing   Problem: Activity: Goal: Risk for activity intolerance will decrease 04/01/2022 0527 by Fernande Bras, RN Outcome: Progressing 04/01/2022 0345 by Fernande Bras, RN Outcome: Progressing   Problem: Nutrition: Goal: Adequate nutrition will be maintained 04/01/2022 0527 by Fernande Bras, RN Outcome: Progressing 04/01/2022 0345 by Fernande Bras, RN Outcome:  Progressing   Problem: Coping: Goal: Level of anxiety will decrease 04/01/2022 0527 by Fernande Bras, RN Outcome: Progressing 04/01/2022 0345 by Fernande Bras, RN Outcome: Progressing   Problem: Elimination: Goal: Will not experience complications related to bowel motility 04/01/2022 0527 by Fernande Bras, RN Outcome: Progressing 04/01/2022 0345 by Fernande Bras, RN Outcome: Progressing Goal: Will not experience complications related to urinary retention 04/01/2022 0527 by Fernande Bras, RN Outcome: Progressing 04/01/2022 0345 by Fernande Bras, RN Outcome: Progressing   Problem: Pain Managment: Goal: General experience of comfort will improve 04/01/2022 0527 by Fernande Bras, RN Outcome: Progressing 04/01/2022 0345 by Fernande Bras, RN Outcome: Progressing   Problem: Safety: Goal: Ability to remain free from injury will improve 04/01/2022 0527 by Fernande Bras, RN Outcome: Progressing 04/01/2022 0345 by Fernande Bras, RN Outcome: Progressing   Problem: Skin Integrity: Goal: Risk for impaired skin integrity will decrease 04/01/2022 0527 by Fernande Bras, RN Outcome: Progressing 04/01/2022 0345 by Fernande Bras, RN Outcome: Progressing

## 2022-04-01 NOTE — Assessment & Plan Note (Signed)
Patient given 2 days of Rocephin and Zithromax while here.  Will switch over to [facility] and Zithromax for 3 more days upon discharge.

## 2022-04-01 NOTE — TOC Initial Note (Signed)
Transition of Care [facility]) - Initial/Assessment Note    Patient Details  Name: Amber Glenn MRN: [id number] Date of Birth: [birth year]  Transition of Care [facility]) CM/SW Contact:    Conception Oms, RN Phone Number: 04/01/2022, 10:15 AM  Clinical Narrative:          Met with the patient and her family in the room, she walks at home independently and does not feel that she needs any DME, I explained the Code 10 and completed I provided her with the Eliquis discount coupon,  Following for possible oxygen needs         Planned Disposition: Home Barriers to Discharge: No Barriers Identified   Patient Goals and CMS Choice            Expected Discharge Plan and Services Planned Disposition: Home   Discharge Planning Services: CM Consult Post Acute Care Choice: Home Health Living arrangements for the past 2 months: Single Family Home Expected Discharge Date: 04/01/22               DME Arranged: N/A         HH Arranged: NA          Prior Living Arrangements/Services Living arrangements for the past 2 months: Single Family Home Lives with:: Relatives Patient language and need for interpreter reviewed:: Yes Do you feel safe going back to the place where you live?: Yes      Need for Family Participation in Patient Care: Yes (Comment) Care giver support system in place?: Yes (comment)   Criminal Activity/Legal Involvement Pertinent to Current Situation/Hospitalization: No - Comment as needed  Activities of Daily Living Home Assistive Devices/Equipment: Eyeglasses ADL Screening (condition at time of admission) Patient's cognitive ability adequate to safely complete daily activities?: Yes Is the patient deaf or have difficulty hearing?: No Does the patient have difficulty seeing, even when wearing glasses/contacts?: No Does the patient have difficulty concentrating, remembering, or making decisions?: No Patient able to express need for assistance with ADLs?: Yes Does  the patient have difficulty dressing or bathing?: No Independently performs ADLs?: Yes (appropriate for developmental age) Does the patient have difficulty walking or climbing stairs?: No Weakness of Legs: None Weakness of Arms/Hands: None  Permission Sought/Granted   Permission granted to share information with : Yes, Verbal Permission Granted              Emotional Assessment Appearance:: Appears stated age Attitude/Demeanor/Rapport: Engaged Affect (typically observed): Pleasant Orientation: : Oriented to Self, Oriented to Place, Oriented to  Time, Oriented to Situation Alcohol / Substance Use: Not Applicable Psych Involvement: No (comment)  Admission diagnosis:  Hypoxia [R09.02] Acute respiratory failure with hypoxia ([place]) [J96.01] Community acquired pneumonia of left lower lobe of lung [J18.9] Pulmonary embolism, other, unspecified chronicity, unspecified whether acute cor pulmonale present ([place]) [I26.99] Pulmonary emboli ([facility]) [I26.99] Patient Active Problem List   Diagnosis Date Noted   Pulmonary emboli ([place]) 04/01/2022   Hypoxia 03/31/2022   Acute pulmonary embolism ([place]) 03/31/2022   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 site [facility]) 03/03/2022   Metastatic adenocarcinoma ([place]) 02/18/2022   Acute low back pain 02/17/2022   Adenocarcinoma ([place]) 02/17/2022   Delirium due to another medical condition 02/17/2022   Palliative care encounter 02/17/2022   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   Neoplasm related pain 02/11/2022   Supraclavicular adenopathy 02/11/2022   Mediastinal mass 02/11/2022   Goals of care, counseling/discussion 02/11/2022   Pathologic fracture  of lumbar vertebra, initial encounter 02/11/2022   Pathologic fracture 02/10/2022   Pyuria 02/10/2022   Left lower lobe pneumonia    Diarrhea    COVID-19 08/18/2019   Thumb pain 06/26/2015   Medicare annual wellness visit, subsequent  03/14/2015   Arthralgia 09/02/2014   Hot flashes 01/21/2014   Left shoulder pain 09/20/2013   Unspecified constipation 08/30/2012   Routine general medical examination at a health care facility 05/29/2012   Screening for colon cancer 05/29/2012   Hypertension 08/18/2011   Osteoarthritis 03/20/2009   Diabetes mellitus type 2, controlled ([place]) 11/14/2008   BASAL CELL CARCINOMA, FACE 12/06/2006   Hyperlipidemia 12/06/2006   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   ALLERGIC RHINITIS 12/06/2006   GERD 12/06/2006   DEGENERATIVE JOINT DISEASE, GENERALIZED 12/06/2006   PCP:  Lynnell Jude, MD Pharmacy:   [id number] [place], [id number]: [phone]: [phone]    Social Determinants of Health (SDOH) Social History: SDOH Screenings   Food Insecurity: No Food Insecurity (03/31/2022)  Housing: Low Risk  (03/31/2022)  Transportation Needs: No Transportation Needs (03/31/2022)  Utilities: Not At Risk (03/31/2022)  Tobacco Use: Medium Risk (03/31/2022)   SDOH Interventions:     Readmission Risk Interventions     No data to display

## 2022-04-01 NOTE — Assessment & Plan Note (Signed)
Within the left saphenous vein.  Patient on anticoagulation with pulmonary embolism.

## 2022-04-01 NOTE — Care Management CC44 (Signed)
Condition Code 44 Documentation Completed  Patient Details  Name: Amber Glenn MRN: [id number] Date of Birth: [birth year]   Condition Code 44 given:  Yes Patient signature on Condition Code 44 notice:  Yes Documentation of 2 MD's agreement:  Yes Code 44 added to claim:  Yes    Conception Oms, RN 04/01/2022, 9:35 AM

## 2022-04-01 NOTE — Progress Notes (Signed)
SATURATION QUALIFICATIONS: (This note is used to comply with regulatory documentation for home oxygen)  Patient Saturations on Room Air at Rest = 92%  Patient Saturations on Room Air while Ambulating = 85%  Patient Saturations on 2 Liters of oxygen while Ambulating = 94%

## 2022-04-01 NOTE — Discharge Summary (Signed)
Physician Discharge Summary   Patient: Amber Glenn MRN: [id number] DOB: 1951/04/08  Admit date:     03/31/2022  Discharge date: 04/01/22  Discharge Physician: Loletha Grayer   PCP: Lynnell Jude, MD   Recommendations at discharge:   Follow-up PCP 5 days Follow-up Dr. Janese Banks oncology  Discharge Diagnoses: Principal Problem:   Acute on chronic respiratory failure with hypoxia [facility]) Active Problems:   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 site [facility])   Acute pulmonary embolism (HCC)   Multifocal pneumonia   Diabetes mellitus type 2, controlled ([place])   GERD   Hypertension   Superficial thrombophlebitis    Hospital Course: The patient was admitted to the hospital on 03/31/2022 and discharged on 04/01/2022.  Came in with hypoxia found to have pneumonia and pulmonary embolism and superficial clot in the saphenous vein.  The patient qualifies for oxygen upon going home with a pulse ox of 85% with ambulating on room air.  Patient was set up with home oxygen.  The patient felt well enough to go home.  Benefits and risks of blood thinner Eliquis explained to patient and patient's daughter.  Assessment and Plan: * Acute on chronic respiratory failure with hypoxia (HCC) The patient qualifies for home oxygen with pulse ox of 85% with ambulation.  Initially had a pulse ox of 76%.  Likely combination of lung cancer, pneumonia and pulmonary embolism.  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  Benefits and risks of blood thinner explained to patient.  Would like to try to keep on blood thinner for 6 months if possible.  Multifocal pneumonia Patient  given 2 days of Rocephin and Zithromax while here.  Will switch over to [facility] and Zithromax for 3 more days upon discharge.  Superficial thrombophlebitis Within the left saphenous vein.  Patient on anticoagulation with pulmonary embolism.  Hypertension Continue metoprolol  GERD Continue PPI  Diabetes mellitus type 2, controlled ([place]) Hold metformin right now since patient had CT scan.          Consultants: Oncology Procedures performed: None Disposition: Home Diet recommendation:  Cardiac diet DISCHARGE MEDICATION: Allergies as of 04/01/2022   No Known Allergies      Medication List     STOP taking these medications    carvedilol 3.125 MG tablet Commonly known as: COREG   celecoxib 200 MG capsule Commonly known as: CeleBREX   losartan 100 MG tablet Commonly known as: COZAAR   metFORMIN 500 MG tablet Commonly known as: GLUCOPHAGE   predniSONE 20 MG tablet Commonly known as: DELTASONE   sucralfate 1 g tablet Commonly known as: Carafate       TAKE these medications    acetaminophen 500 MG tablet Commonly known as: TYLENOL Take 500 mg by mouth every 6 (six) hours as needed for moderate pain, mild pain, fever or headache.   apixaban 5 MG Tabs tablet Commonly known as: ELIQUIS Two tabs po twice a day for six days then one tab po twice a day afterwards   azithromycin 250 MG tablet Commonly known as: Zithromax One tab po daily for three days Start taking on: April 02, 2022   cefdinir 300 MG capsule Commonly known as: OMNICEF  Take 1 capsule (300 mg total) by mouth 2 (two) times daily for 3 days. Start taking on: April 02, 2022   cyclobenzaprine 5 MG tablet Commonly known as: FLEXERIL Take 1 tablet (5 mg total) by mouth 3 (three) times daily as needed for muscle spasms.   gabapentin 300 MG capsule Commonly known as: Neurontin Take 1 capsule (300 mg total) by mouth 3 (three) times daily.   Iron 325 (65 Fe) MG Tabs Take 1 tablet by  mouth daily at 12 noon.   LORazepam 0.5 MG tablet Commonly known as: ATIVAN Take 1 tablet (0.5 mg total) by mouth every 8 (eight) hours as needed for anxiety.   melatonin 5 MG Tabs Take 1 tablet (5 mg total) by mouth at bedtime as needed.   metoprolol tartrate 25 MG tablet Commonly known as: LOPRESSOR Take 1 tablet (25 mg total) by mouth 2 (two) times daily.   multivitamin capsule Take 1 capsule by mouth daily.   nystatin 100000 UNIT/ML suspension Commonly known as: MYCOSTATIN Use as directed 5 mLs (500,000 Units total) in the mouth or throat 4 (four) times daily.   omeprazole 20 MG capsule Commonly known as: PRILOSEC Take 1 capsule (20 mg total) by mouth daily.   ondansetron 8 MG tablet Commonly known as: ZOFRAN Take 1 tablet (8 mg total) by mouth every 8 (eight) hours as needed for nausea or vomiting.   Oxycodone HCl 20 MG Tabs Take 0.5-1 tablets (10-20 mg total) by mouth every 4 (four) hours as needed.   polyethylene glycol 17 g packet Commonly known as: MIRALAX / GLYCOLAX Take 17 g by mouth daily.   prednisoLONE acetate 0.12 % ophthalmic suspension Commonly known as: PRED MILD Place 1 drop into the right eye daily.   prochlorperazine 10 MG tablet Commonly known as: COMPAZINE Take 1 tablet (10 mg total) by mouth every 6 (six) hours as needed for nausea or vomiting.   simvastatin 40 MG tablet Commonly known as: ZOCOR TAKE 1 TABLET EVERY DAY  AT  6:00 PM   STOOL SOFTENER PO Take 1 capsule by mouth daily as needed (constipation).   vitamin C 1000 MG tablet Take 1,000 mg by mouth daily.   Xtampza ER 18 MG C12a Generic drug: oxyCODONE ER Take 1 capsule by mouth every 12 (twelve) hours.               Durable Medical Equipment  (From admission, onward)           Start     Ordered   04/01/22 0931  For home use only DME oxygen  Once       Question Answer Comment  Length of Need Lifetime   Mode or (Route) Nasal cannula   Liters per Minute 2    Frequency Continuous (stationary and portable oxygen unit needed)   Oxygen conserving device Yes   Oxygen delivery system Gas      04/01/22 0930            Follow-up Information     Lynnell Jude, MD Follow up on 04/07/2022.   Specialty: Family Medicine Why: 045 Contact information: [address] [place] [id number] [phone]                Discharge Exam: Filed Weights   03/31/22 0514  Weight: 54 kg   Physical Exam HENT:     Head: Normocephalic.     Mouth/Throat:     Pharynx: No oropharyngeal exudate.  Eyes:  General: Lids are normal.     Conjunctiva/sclera: Conjunctivae normal.  Cardiovascular:     Rate and Rhythm: Regular rhythm. Tachycardia present.     Heart sounds: Normal heart sounds, S1 normal and S2 normal.  Pulmonary:     Breath sounds: Examination of the right-lower field reveals decreased breath sounds. Examination of the left-lower field reveals decreased breath sounds. Decreased breath sounds present. No wheezing, rhonchi or rales.  Abdominal:     Palpations: Abdomen is soft.     Tenderness: There is no abdominal tenderness.  Musculoskeletal:     Right lower leg: No swelling.     Left lower leg: No swelling.  Skin:    General: Skin is warm.     Findings: No rash.  Neurological:     Mental Status: She is alert and oriented to person, place, and time.      Condition at discharge: fair  The results of significant diagnostics from this hospitalization (including imaging, microbiology, ancillary and laboratory) are listed below for reference.   Imaging Studies: ECHOCARDIOGRAM COMPLETE  Result Date: 04/01/2022    ECHOCARDIOGRAM REPORT   Patient Name:   Amber Glenn Date of Exam: 04/01/2022 Medical Rec #:  [id number]      Height:       60.0 in Accession #:    [id number]     Weight:       119.0 lb Date of Birth:  05-06-1950     BSA:          1.497 m Patient Age:    37 years       BP:           123/49 mmHg Patient Gender: F               HR:           105 bpm. Exam Location:  [facility] Procedure: 2D Echo, Color Doppler and Cardiac Doppler Indications:     I26.09 Pulmonary Embolus  History:         Patient has no prior history of Echocardiogram examinations.                  Risk Factors:Hypertension, Diabetes and Dyslipidemia.  Sonographer:     Charmayne Sheer Referring Phys:  [place] Diagnosing Phys: Ida Rogue MD IMPRESSIONS  1. Left ventricular ejection fraction, by estimation, is 50 to 55%. The left ventricle has low normal function. The left ventricle has no regional wall motion abnormalities. Left ventricular diastolic parameters are consistent with Grade II diastolic dysfunction (pseudonormalization).  2. Right ventricular systolic function is normal. The right ventricular size is normal. Tricuspid regurgitation signal is inadequate for assessing PA pressure.  3. The mitral valve is normal in structure. Mild mitral valve regurgitation. No evidence of mitral stenosis.  4. The aortic valve is tricuspid. Aortic valve regurgitation is not visualized. Aortic valve sclerosis is present, with no evidence of aortic valve stenosis.  5. The inferior vena cava is normal in size with greater than 50% respiratory variability, suggesting right atrial pressure of 3 mmHg. FINDINGS  Left Ventricle: Left ventricular ejection fraction, by estimation, is 50 to 55%. The left ventricle has low normal function. The left ventricle has no regional wall motion abnormalities. The left ventricular internal cavity size was normal in size. There is no left ventricular hypertrophy. Left ventricular diastolic parameters are consistent with Grade II diastolic dysfunction (pseudonormalization). Right Ventricle: The right ventricular size is normal. No increase in right ventricular wall  thickness. Right ventricular systolic function is normal. Tricuspid regurgitation signal is inadequate for assessing PA pressure. Left Atrium: Left atrial size was normal in  size. Right Atrium: Right atrial size was normal in size. Pericardium: There is no evidence of pericardial effusion. Mitral Valve: The mitral valve is normal in structure. Mild mitral valve regurgitation. No evidence of mitral valve stenosis. Tricuspid Valve: The tricuspid valve is normal in structure. Tricuspid valve regurgitation is mild . No evidence of tricuspid stenosis. Aortic Valve: The aortic valve is tricuspid. Aortic valve regurgitation is not visualized. Aortic valve sclerosis is present, with no evidence of aortic valve stenosis. Aortic valve mean gradient measures 3.0 mmHg. Aortic valve peak gradient measures 5.3  mmHg. Aortic valve area, by VTI measures 2.39 cm. Pulmonic Valve: The pulmonic valve was normal in structure. Pulmonic valve regurgitation is not visualized. No evidence of pulmonic stenosis. Aorta: The aortic root is normal in size and structure. Venous: The inferior vena cava is normal in size with greater than 50% respiratory variability, suggesting right atrial pressure of 3 mmHg. IAS/Shunts: No atrial level shunt detected by color flow Doppler.  LEFT VENTRICLE PLAX 2D LVIDd:         4.20 cm   Diastology LVIDs:         3.20 cm   LV e' medial:    5.77 cm/s LV PW:         1.00 cm   LV E/e' medial:  19.9 LV IVS:        0.60 cm   LV e' lateral:   5.33 cm/s LVOT diam:     2.00 cm   LV E/e' lateral: 21.6 LV SV:         53 LV SV Index:   35 LVOT Area:     3.14 cm  RIGHT VENTRICLE RV Basal diam:  3.50 cm RV S prime:     9.90 cm/s LEFT ATRIUM           Index        RIGHT ATRIUM           Index LA diam:      2.80 cm 1.87 cm/m   RA Area:     11.90 cm LA Vol (A2C): 29.0 ml 19.37 ml/m  RA Volume:   28.60 ml  19.10 ml/m LA Vol (A4C): 37.5 ml 25.05 ml/m  AORTIC VALVE                    PULMONIC VALVE AV Area (Vmax):    2.16 cm     PV Vmax:       1.04 m/s AV Area (Vmean):   2.28 cm     PV Vmean:      72.400 cm/s AV Area (VTI):     2.39 cm     PV VTI:        0.188 m AV Vmax:           115.00 cm/s   PV Peak grad:  4.3 mmHg AV Vmean:          85.400 cm/s  PV Mean grad:  2.0 mmHg AV VTI:            0.221 m AV Peak Grad:      5.3 mmHg AV Mean Grad:      3.0 mmHg LVOT Vmax:         79.00 cm/s LVOT Vmean:        61.900 cm/s LVOT VTI:  0.168 m LVOT/AV VTI ratio: 0.76  AORTA Ao Root diam: 2.60 cm MITRAL VALVE MV Area (PHT): 6.87 cm     SHUNTS MV Decel Time: 111 msec     Systemic VTI:  0.17 m MV E velocity: 115.00 cm/s  Systemic Diam: 2.00 cm Ida Rogue MD Electronically signed by Ida Rogue MD Signature Date/Time: 04/01/2022/1:10:44 PM    Final    US Venous Img Lower Bilateral (DVT)  Result Date: 03/31/2022 CLINICAL DATA:  PEs.  Evaluate for DVT. EXAM: BILATERAL LOWER EXTREMITY VENOUS DOPPLER ULTRASOUND TECHNIQUE: Gray-scale sonography with graded compression, as well as color Doppler and duplex ultrasound were performed to evaluate the lower extremity deep venous systems from the level of the common femoral vein and including the common femoral, femoral, profunda femoral, popliteal and calf veins including the posterior tibial, peroneal and gastrocnemius veins when visible. The superficial great saphenous vein was also interrogated. Spectral Doppler was utilized to evaluate flow at rest and with distal augmentation maneuvers in the common femoral, femoral and popliteal veins. COMPARISON:  CTA PE, concurrent. FINDINGS: RIGHT LOWER EXTREMITY VENOUS Normal compressibility of the RIGHT common femoral, superficial femoral, and popliteal veins, as well as the visualized calf veins. Visualized portions of profunda femoral vein and great saphenous vein unremarkable. No filling defects to suggest DVT on grayscale or color Doppler imaging. Doppler waveforms show normal direction of venous flow, normal respiratory plasticity and response to augmentation. OTHER No abnormal fluid collection. Heterogeneously-echogenic filling defect with incomplete coaptation within the imaged portion of the LEFT greater  saphenous vein. Limitations: none LEFT LOWER EXTREMITY VENOUS Normal compressibility of the LEFT common femoral, superficial femoral, and popliteal veins, as well as the visualized calf veins. Visualized portions of profunda femoral vein and great saphenous vein unremarkable. No filling defects to suggest DVT on grayscale or color Doppler imaging. Doppler waveforms show normal direction of venous flow, normal respiratory plasticity and response to augmentation. OTHER No evidence of superficial thrombophlebitis or abnormal fluid collection. Limitations: none IMPRESSION: 1. No evidence of femoropopliteal DVT within either lower extremity. 2. Examination is POSITIVE for superficial thrombophlebitis within the LEFT greater saphenous vein. Michaelle Birks, MD [facility] Radiology Electronically Signed   By: Michaelle Birks M.D.   On: 03/31/2022 09:34   CT Angio Chest PE W and/or Wo Contrast  Result Date: 03/31/2022 CLINICAL DATA:  Evaluate for pulmonary embolism. History of stage IV lung cancer. * Tracking Code: BO *. EXAM: CT ANGIOGRAPHY CHEST WITH CONTRAST TECHNIQUE: Multidetector CT imaging of the chest was performed using the standard protocol during bolus administration of intravenous contrast. Multiplanar CT image reconstructions and MIPs were obtained to evaluate the vascular anatomy. RADIATION DOSE REDUCTION: This exam was performed according to the departmental dose-optimization program which includes automated exposure control, adjustment of the mA and/or kV according to patient size and/or use of iterative reconstruction technique. CONTRAST:  35mL OMNIPAQUE IOHEXOL 350 MG/ML SOLN COMPARISON:  02/10/2022 FINDINGS: Cardiovascular: Exam detail is diminished due 1 to motion artifact. There is a single, small filling defect within a segmental branch of the right middle lobe pulmonary artery, image 42/7. Heart size appears normal. No pericardial effusion. Aortic  atherosclerosis and coronary artery calcifications. Mediastinum/Nodes: Thyroid gland, trachea, and esophagus are unremarkable. Index anterior mediastinal, prevascular mass measures 3.2 x 3.2 cm, image 104/6. Previously 3.1 by 2.2 cm. Subcarinal lymph node measures 1.4 cm, image 172/6. Previously 0.8 cm. Lungs/Pleura: Small left pleural effusion is new from previous exam. Progressive pleural thickening overlying  the posterior left lower lobe. New heterogeneous ground-glass attenuation is scattered throughout both lungs. Progressive areas of atelectasis and architectural distortion noted within the left upper lobe and left lower lobe. Multiple new nodules are scattered throughout both lungs in worrisome for progression of disease. Index nodules include, including: Posteromedial left upper lobe nodule measures 7 mm, image 40/5. Posterolateral right upper lobe lung nodule measures 4 mm, image 30/5. Superior segment left lower lobe lung nodule measures 4 mm, image 54/5. Right lower lobe lung nodule measures 5 mm, image 69/5. Left lower lobe lung nodule measures 8 mm, image 70/5. Upper Abdomen: No acute abnormality. Musculoskeletal: No chest wall abnormality. No acute or significant osseous findings. Review of the MIP images confirms the above findings. IMPRESSION: 1. Exam detail is diminished due to motion artifact. There is a single, small filling defect within a segmental branch of the right middle lobe pulmonary artery compatible with acute pulmonary embolus. 2. Interval increase in size of anterior mediastinal soft tissue mass. There is also been increase in size of subcarinal lymph node. 3. Multiple new nodules are scattered throughout both lungs in worrisome for pulmonary metastasis. 4. New small left pleural effusion with progressive pleural thickening overlying the posterior left lower lobe. 5. New heterogeneous ground-glass attenuation scattered throughout both lungs. Findings are nonspecific and may be  inflammatory or infectious in etiology. 6. Progressive areas of atelectasis and architectural distortion noted within the left upper lobe and left lower lobe. 7. Coronary artery calcifications. 8.  Aortic Atherosclerosis (ICD10-I70.0). Critical Value/emergent results were called by telephone at the time of interpretation on 03/31/2022 at 7:40 am to provider Dr. Jori Moll, who verbally acknowledged these results. Electronically Signed   By: Kerby Moors M.D.   On: 03/31/2022 07:41   DG Chest Portable 1 View  Result Date: 03/31/2022 CLINICAL DATA:  Shortness of breath. EXAM: PORTABLE CHEST 1 VIEW COMPARISON:  Aug 18, 2019 FINDINGS: The heart size and mediastinal contours are within normal limits. Low lung volumes are noted. Moderate severity linear atelectasis and/or infiltrate is seen within the left lung base. There is no evidence of a pleural effusion or pneumothorax. Postoperative changes are seen within the lower thoracic spine. IMPRESSION: Moderate severity left basilar linear atelectasis and/or infiltrate. Electronically Signed   By: Virgina Norfolk M.D.   On: 03/31/2022 06:00   DG Lumbar Spine Complete  Result Date: 03/30/2022 CLINICAL DATA:  Fall.  History of compression fracture EXAM: LUMBAR SPINE - COMPLETE 4+ VIEW COMPARISON:  Lumbar spine radiographs 03/08/2022. CT lumbar spine 03/16/2022 FINDINGS: Lytic lesion L1 vertebral body best seen on CT. This area is obscured by barium in the left colon on the lateral view. This is associated with L1 compression fracture. No new fracture identified. Bilateral pedicle screw and rod fusion T11 through L3. Hardware in satisfactory position. IMPRESSION: Lytic lesion L1 with pathologic fracture unchanged. Posterior fusion hardware in satisfactory position. No new fracture. Electronically Signed   By: Franchot Gallo M.D.   On: 03/30/2022 14:54   DG Thoracic Spine 2 View  Result Date: 03/30/2022 CLINICAL DATA:  Provided history: Primary malignant neoplasm  of lung metastatic to other site, unspecified laterality. Neoplasm related pain. EXAM: THORACIC SPINE 2 VIEWS COMPARISON:  CT of the thoracic spine 02/10/2022. FINDINGS: Mild Dextrocurvature of the upper thoracic spine. No significant spondylolisthesis. No appreciable thoracic vertebral compression fracture. Thoracic spondylosis with multilevel disc space narrowing, degenerative endplate irregularity and degenerative endplate sclerosis. Posterior spinal fusion construct spanning the T11-L3 levels. Known pathologic L1 vertebral  compression fracture better assessed on same day lumbar spine radiographs. Incidentally noted, there is residual contrast within the colon. Aortic atherosclerosis. IMPRESSION: 1. No appreciable thoracic vertebral compression fracture. 2. Mild Dextrocurvature of the upper thoracic spine, possibly positional. 3. Thoracic spondylosis, as described. 4. Posterior spinal fusion construct spanning the T11-L3 levels. Known pathologic L1 vertebral compression fracture better assessed on the same day lumbar spine radiographs. 5. Aortic Atherosclerosis (ICD10-I70.0). Electronically Signed   By: Kellie Simmering D.O.   On: 03/30/2022 14:01   DG THORACOLUMABAR SPINE  Result Date: 03/28/2022 CLINICAL DATA:  T11-L3 fusion.  Back pain. EXAM: THORACOLUMBAR SPINE 1V COMPARISON:  Lumbar spine CT 03/16/2022 FINDINGS: There is dense contrast material throughout the entire colon which limits evaluation, particularly on the lateral view. Posterior fusion rods are seen bilaterally with transpedicular screws at T11, T12, L2 and L3, unchanged in position. There is no evidence for hardware loosening. Lucent lesions throughout the L1 vertebral body are grossly unchanged. Alignment is grossly stable with grade 1 anterolisthesis at L4-L5. IMPRESSION: 1. Stable T11-L3 posterior fusion. No evidence for hardware loosening. 2. Lucent lesions throughout the L1 vertebral body are grossly unchanged. Electronically Signed   By:  Ronney Asters M.D.   On: 03/28/2022 00:21   IR Radiologist Eval & Mgmt  Result Date: 03/24/2022 EXAM: NEW PATIENT OFFICE VISIT CHIEF COMPLAINT: See below HISTORY OF PRESENT ILLNESS: See below REVIEW OF SYSTEMS: See below PHYSICAL EXAMINATION: See below ASSESSMENT AND PLAN: Please refer to completed note in the electronic medical record on Eureka Mugweru, MD [facility]iology Electronically Signed   By: Michaelle Birks M.D.   On: 03/24/2022 13:32   DG ESOPHAGUS W DOUBLE CM (HD)  Result Date: 03/24/2022 CLINICAL DATA:  Dysphagia, lung cancer EXAM: ESOPHOGRAM / BARIUM SWALLOW / BARIUM TABLET STUDY TECHNIQUE: Combined double contrast and single contrast examination performed using effervescent crystals, thick barium liquid, and thin barium liquid. The patient was observed with fluoroscopy swallowing a 13 mm barium sulphate tablet. FLUOROSCOPY: Radiation Exposure Index (as provided by the fluoroscopic device): 5 mGy COMPARISON:  None Available. FINDINGS: Normal pharyngeal anatomy and motility. Contrast flowed freely through the esophagus without evidence of a mass. Normal esophageal mucosa without evidence of irregularity or ulceration. Mild tertiary contractions of the esophagus as can be seen with mild spasm. Mild gastroesophageal reflux. Mild relative narrowing of the distal esophagus just proximal to the gastroesophageal junction concerning for a mild stricture restricting the passage of a 13 mm barium tablet. No definite hiatal hernia was demonstrated. IMPRESSION: 1. Mild relative narrowing of the distal esophagus just proximal to the gastroesophageal junction concerning for a mild stricture restricting the passage of a 13 mm barium tablet. 2. Mild tertiary contractions of the esophagus as can be seen with mild spasm. Electronically Signed   By: Kathreen Devoid M.D.   On: 03/24/2022 10:17   CT Lumbar Spine Wo Contrast  Result Date:  03/16/2022 CLINICAL DATA:  Musculoskeletal neoplasm. Persistent back pain. Evaluate for feasibility of RFA. EXAM: CT LUMBAR SPINE WITHOUT CONTRAST TECHNIQUE: Multidetector CT imaging of the lumbar spine was performed without intravenous contrast administration. Multiplanar CT image reconstructions were also generated. RADIATION DOSE REDUCTION: This exam was performed according to the departmental dose-optimization program which includes automated exposure control, adjustment of the mA and/or kV according to patient size and/or use of iterative reconstruction technique. COMPARISON:  CT lumbar spine dated February 10, 2022 FINDINGS: Segmentation: 5 lumbar type vertebrae. Alignment: Grade 1  anterolisthesis of L4. Vertebrae: Pathological fracture of L1 vertebral body with multiple lucencies involving the vertebral bodies and bilateral pedicles is again noted. Persistent approximately 5 mm bulge of the vertebral body into the spinal canal. Posterior spinal fusion with bipedicular screws from T11-L3, sparing the L1. Paraspinal and other soft tissues: Atherosclerotic calcification of abdominal aorta and branch vessels. No acute abnormality. Disc levels: T12-L1: No significant disc bulge, spinal canal or neural foraminal stenosis. L1-L2: No significant disc bulge, spinal canal or neural foraminal stenosis. L2-L3: No significant disc bulge, spinal canal or neural foraminal stenosis. L3-L4: Disc height loss and disc osteophyte complex with mild disc bulge. Mild bilateral facet joint arthropathy. Mild narrowing of lateral recesses bilaterally. No significant neural foraminal stenosis. L4-L5: Anterolisthesis of L4. Broad-based disc bulge and moderate bilateral facet joint arthropathy. Narrowing of lateral recesses bilaterally. Mild bilateral neural foraminal stenosis. L5-S1: Broad-based disc bulge and moderate facet joint arthropathy. Mild bilateral lateral recess stenosis. No significant neural foraminal stenosis IMPRESSION: 1.  Pathological fracture of L1 vertebral body with multiple lucencies involving the vertebral bodies and bilateral pedicles is again noted. Persistent approximately 5 mm bulge of the vertebral body into the spinal canal. 2. Posterior spinal fusion with bipedicular screws from T11-L3, sparing the L1. 3. Multilevel degenerative disc disease with disc osteophyte complex at L3-L4, L4-L5 and L5-S1 with narrowing of lateral recesses bilaterally at L3-L4, L4-L5 and L5-S1. 4. Aortic atherosclerosis. Aortic Atherosclerosis (ICD10-I70.0). Electronically Signed   By: Keane Police D.O.   On: 03/16/2022 11:35   DG Pelvis 1-2 Views  Result Date: 03/08/2022 CLINICAL DATA:  Fall 6 days ago.  Left lower back and hip pain. EXAM: PELVIS - 1-2 VIEW COMPARISON:  CT abdomen and pelvis 01/02/2013 FINDINGS: Mildly decreased bone mineralization. The bilateral sacroiliac, femoroacetabular, and pubic symphysis joint spaces are maintained. No acute fracture is seen. No dislocation. Moderate right L4-5 disc space narrowing. IMPRESSION: 1. No acute fracture is seen. 2. Moderate right L4-5 disc space narrowing. Electronically Signed   By: Yvonne Kendall M.D.   On: 03/08/2022 11:46   DG Lumbar Spine 2-3 Views  Result Date: 03/08/2022 CLINICAL DATA:  Fall Tuesday night 03/02/2022. Left lower back and hip pain. Back surgery 02/15/2022 for L1 fracture. EXAM: LUMBAR SPINE - 2-3 VIEW COMPARISON:  CT lumbar spine 02/10/2022, MRI lumbar spine 02/10/2022 FINDINGS: There is again a moderate L1 vertebral body compression fracture. Moderate approximately 5 mm retropulsion of the posterosuperior aspect of the L 1 vertebral body is unchanged from 02/10/2022 CT and MRI. Interval bilateral transpedicular screws placed at T11, T12, L2, and L3, with bilateral posterior interconnecting rods. Minimal dextrocurvature centered at T12 is unchanged from prior. 6 mm grade 1 anterolisthesis of L4 on L5, unchanged from prior. Moderate L3-4 and L4-5 and mild T9-10  through T11-12 disc space narrowing. IMPRESSION: Compared to 02/10/2022: 1. Moderate L1 vertebral body compression fracture, unchanged from 02/10/2022 CT and MRI. 2. Interval bilateral posterior fusion from T11-L3 except for the L1 vertebral body level, treatment for the prior L1 compression fracture. 3. Unchanged grade 1 anterolisthesis of L4 on L5. Electronically Signed   By: Yvonne Kendall M.D.   On: 03/08/2022 11:45    Microbiology: Results for orders placed or performed during the hospital encounter of 03/31/22  Resp panel by RT-PCR (RSV, Flu A&B, Covid) Anterior Nasal Swab     Status: None   Collection Time: 03/31/22  5:33 AM   Specimen: Anterior Nasal Swab  Result Value Ref Range Status   SARS  Coronavirus 2 by RT PCR NEGATIVE NEGATIVE Final    Comment: (NOTE) SARS-CoV-2 target nucleic acids are NOT DETECTED.  The SARS-CoV-2 RNA is generally detectable in upper respiratory specimens during the acute phase of infection. The lowest concentration of SARS-CoV-2 viral copies this assay can detect is 138 copies/mL. A negative result does not preclude SARS-Cov-2 infection and should not be used as the sole basis for treatment or other patient management decisions. A negative result may occur with  improper specimen collection/handling, submission of specimen other than nasopharyngeal swab, presence of viral mutation(s) within the areas targeted by this assay, and inadequate number of viral copies(<138 copies/mL). A negative result must be combined with clinical observations, patient history, and epidemiological information. The expected result is Negative.  Fact Sheet for Patients:  EntrepreneurPulse.com.au  Fact Sheet for Healthcare Providers:  IncredibleEmployment.be  This test is no t yet approved or cleared by the [place] FDA and  has been authorized for detection and/or diagnosis of SARS-CoV-2 by FDA under an Emergency Use Authorization  (EUA). This EUA will remain  in effect (meaning this test can be used) for the duration of the COVID-19 declaration under Section 564(b)(1) of the Act, 21 U.S.C.section 360bbb-3(b)(1), unless the authorization is terminated  or revoked sooner.       Influenza A by PCR NEGATIVE NEGATIVE Final   Influenza B by PCR NEGATIVE NEGATIVE Final    Comment: (NOTE) The Xpert Xpress SARS-CoV-2/FLU/RSV plus assay is intended as an aid in the diagnosis of influenza from Nasopharyngeal swab specimens and should not be used as a sole basis for treatment. Nasal washings and aspirates are unacceptable for Xpert Xpress SARS-CoV-2/FLU/RSV testing.  Fact Sheet for Patients: EntrepreneurPulse.com.au  Fact Sheet for Healthcare Providers: IncredibleEmployment.be  This test is not yet approved or cleared by the [place] FDA and has been authorized for detection and/or diagnosis of SARS-CoV-2 by FDA under an Emergency Use Authorization (EUA). This EUA will remain in effect (meaning this test can be used) for the duration of the COVID-19 declaration under Section 564(b)(1) of the Act, 21 U.S.C. section 360bbb-3(b)(1), unless the authorization is terminated or revoked.     Resp Syncytial Virus by PCR NEGATIVE NEGATIVE Final    Comment: (NOTE) Fact Sheet for Patients: EntrepreneurPulse.com.au  Fact Sheet for Healthcare Providers: IncredibleEmployment.be  This test is not yet approved or cleared by the [place] FDA and has been authorized for detection and/or diagnosis of SARS-CoV-2 by FDA under an Emergency Use Authorization (EUA). This EUA will remain in effect (meaning this test can be used) for the duration of the COVID-19 declaration under Section 564(b)(1) of the Act, 21 U.S.C. section 360bbb-3(b)(1), unless the authorization is terminated or revoked.  Performed at [facility], [address]  [place], [place] [address]     Labs: CBC: Recent Labs  Lab 03/31/22 0531  WBC 12.1*  NEUTROABS 10.3*  HGB 10.5*  HCT 35.5*  MCV 84.1  PLT 662   Basic Metabolic Panel: Recent Labs  Lab 03/31/22 0531  NA 134*  K 4.5  CL 101  CO2 26  GLUCOSE 122*  BUN 26*  CREATININE 0.71  CALCIUM 9.1   Liver Function Tests: Recent Labs  Lab 03/31/22 0531  AST 21  ALT 19  ALKPHOS 74  BILITOT 0.8  PROT 6.3*  ALBUMIN 3.2*   CBG: Recent Labs  Lab 03/31/22 1310 03/31/22 1804 03/31/22 2135 04/01/22 0808  GLUCAP 96 112* 124* 100*    Discharge time spent: greater  than 30 minutes.  Signed: Loletha Grayer, MD [facility] 04/01/2022

## 2022-04-01 NOTE — TOC Progression Note (Addendum)
Transition of Care [facility]) - Progression Note    Patient Details  Name: [name] MRN: [id number] Date of Birth: 1951-01-09  Transition of Care [facility] CM/SW [place], RN Phone Number: 04/01/2022, 12:04 PM  Clinical Narrative:    Reached out to Cyril Mourning at [facility] and requested Oxygen for home    Planned Disposition: Home Barriers to Discharge: No Barriers Identified  Expected Discharge Plan and Services   Discharge Planning Services: CM Consult Post Acute Care Choice: [place] arrangements for the past 2 months: Single Family Home Expected Discharge Date: 04/01/22               DME Arranged: N/A         HH Arranged: NA           Social Determinants of Health (SDOH) Interventions SDOH Screenings   Food Insecurity: No Food Insecurity (03/31/2022)  Housing: Low Risk  (03/31/2022)  Transportation Needs: No Transportation Needs (03/31/2022)  Utilities: Not At Risk (03/31/2022)  Tobacco Use: Medium Risk (03/31/2022)    Readmission Risk Interventions     No data to display

## 2022-04-01 NOTE — Progress Notes (Signed)
*  PRELIMINARY RESULTS* Echocardiogram 2D Echocardiogram has been performed.  Amber Glenn Amber Glenn 04/01/2022, 11:08 AM

## 2022-04-06 ENCOUNTER — Other Ambulatory Visit: Payer: Self-pay | Admitting: Oncology

## 2022-04-06 ENCOUNTER — Telehealth: Payer: Self-pay | Admitting: *Deleted

## 2022-04-06 ENCOUNTER — Other Ambulatory Visit: Payer: Self-pay | Admitting: *Deleted

## 2022-04-06 DIAGNOSIS — C3491 Malignant neoplasm of unspecified part of right bronchus or lung: Secondary | ICD-10-CM

## 2022-04-06 MED ORDER — XTAMPZA ER 18 MG PO C12A: 1.0000 | EXTENDED_RELEASE_CAPSULE | Freq: Two times a day (BID) | ORAL | 0 refills | Status: DC

## 2022-04-06 MED ORDER — PROCHLORPERAZINE MALEATE 10 MG PO TABS: 10.0000 mg | ORAL_TABLET | Freq: Four times a day (QID) | ORAL | 1 refills | Status: DC | PRN

## 2022-04-06 MED ORDER — DEXAMETHASONE 4 MG PO TABS: ORAL_TABLET | ORAL | 1 refills | Status: DC

## 2022-04-06 MED ORDER — ONDANSETRON HCL 8 MG PO TABS: 8.0000 mg | ORAL_TABLET | Freq: Three times a day (TID) | ORAL | 1 refills | Status: DC | PRN

## 2022-04-06 MED ORDER — FOLIC ACID 1 MG PO TABS: 1.0000 mg | ORAL_TABLET | Freq: Every day | ORAL | 3 refills | Status: DC

## 2022-04-06 NOTE — Telephone Encounter (Signed)
We will look into it and get that scheduled for her.

## 2022-04-06 NOTE — Progress Notes (Signed)
START ON PATHWAY REGIMEN - Non-Small Cell Lung     A cycle is every 21 days:     Pembrolizumab      Pemetrexed      Carboplatin   **Always confirm dose/schedule in your pharmacy ordering system**  Patient Characteristics: Stage IV Metastatic, Nonsquamous, Molecular Analysis Completed, Molecular Alteration Present and Targeted Therapy Exhausted OR EGFR Exon 20+ or KRAS G12C+ or HER2+ Present and No Prior Chemo/Immunotherapy OR No Alteration Present, Initial  Chemotherapy/Immunotherapy, PS = 2, No Alteration Present, No Alteration Present, PD-L1 Expression Positive 1-49% (TPS) / Negative / Not Tested / Awaiting Test Results Therapeutic Status: Stage IV Metastatic Histology: Nonsquamous Cell Broad Molecular Profiling Status: Molecular Analysis Completed Molecular Analysis Results: No Alteration Present ECOG Performance Status: 2 Chemotherapy/Immunotherapy Line of Therapy: Initial Chemotherapy/Immunotherapy EGFR Exons 18-21 Mutation Testing Status: Completed and Negative ALK Fusion/Rearrangement Testing Status: Completed and Negative BRAF V600 Mutation Testing Status: Completed and Negative KRAS G12C Mutation Testing Status: Completed and Negative MET Exon 14 Mutation Testing Status: Completed and Negative RET Fusion/Rearrangement Testing Status: Completed and Negative HER2 Mutation Testing Status: Completed and Negative NTRK Fusion/Rearrangement Testing Status: Completed and Negative ROS1 Fusion/Rearrangement Testing Status: Completed and Negative PD-L1 Expression Status: PD-L1 Positive 1-49% (TPS) Intent of Therapy: Non-Curative / Palliative Intent, Discussed with Patient

## 2022-04-06 NOTE — Telephone Encounter (Signed)
Daughter wants Dr Janese Banks to know that patient has decided that she will try to take the chemotherapy

## 2022-04-07 ENCOUNTER — Other Ambulatory Visit: Payer: Self-pay | Admitting: *Deleted

## 2022-04-07 ENCOUNTER — Telehealth: Payer: Self-pay | Admitting: *Deleted

## 2022-04-07 ENCOUNTER — Other Ambulatory Visit: Payer: Self-pay | Admitting: Oncology

## 2022-04-07 ENCOUNTER — Encounter: Payer: Self-pay | Admitting: Oncology

## 2022-04-07 DIAGNOSIS — C3490 Malignant neoplasm of unspecified part of unspecified bronchus or lung: Secondary | ICD-10-CM

## 2022-04-07 DIAGNOSIS — C3491 Malignant neoplasm of unspecified part of right bronchus or lung: Secondary | ICD-10-CM

## 2022-04-07 MED ORDER — SULFAMETHOXAZOLE-TRIMETHOPRIM 800-160 MG PO TABS: 1.0000 | ORAL_TABLET | Freq: Two times a day (BID) | ORAL | 0 refills | Status: DC

## 2022-04-07 NOTE — Telephone Encounter (Signed)
RN called and spoke with pt daughter.  Nurse instructed that patient could take her carvediol and pain medications only with no more than 3 small teaspoon bites of yogurt the morning of port cath insertion procedure per IR. Daughter verbalized understanding.

## 2022-04-07 NOTE — Telephone Encounter (Signed)
Daughter called stating that she feels patient has a UTI and that she did a home test that was mildly positive. She states that may be why patient is a bit confused. She is asking if we will order antibiotics for patient. Please advise

## 2022-04-07 NOTE — Telephone Encounter (Signed)
Prescription sent, Amber Glenn confirmed that [facility] [address] is correct pharmacy

## 2022-04-07 NOTE — Telephone Encounter (Signed)
Please send Bactrim DS BID for 5 days

## 2022-04-09 ENCOUNTER — Inpatient Hospital Stay: Payer: Medicare HMO

## 2022-04-13 ENCOUNTER — Other Ambulatory Visit: Payer: Self-pay | Admitting: Student

## 2022-04-13 DIAGNOSIS — C799 Secondary malignant neoplasm of unspecified site: Secondary | ICD-10-CM

## 2022-04-13 NOTE — Progress Notes (Signed)
Patient for IR Port Placement on Wed 04/14/2022, I spoke with the patient's daughter, [name] on the phone and gave pre-procedure instructions. Pt's daughter was made aware that the patient needs to be here at 12:30p at the new entrance, NPO after MN prior to procedure as well as driver post procedure/recovery/discharge. Pt's daughter stated understanding.  Pt's daughter had me ask Dr Denna Haggard if she could take her pain medicine and BP medicine with a couple of bites of yogurt.  Dr Denna Haggard stated she could with only a [place].  I spoke with the pt's daughter on 04/13/2022.

## 2022-04-14 ENCOUNTER — Encounter: Payer: Self-pay | Admitting: Radiology

## 2022-04-14 ENCOUNTER — Other Ambulatory Visit: Payer: Self-pay | Admitting: *Deleted

## 2022-04-14 ENCOUNTER — Inpatient Hospital Stay: Payer: Medicare HMO | Admitting: Occupational Therapy

## 2022-04-14 ENCOUNTER — Ambulatory Visit
Admission: RE | Admit: 2022-04-14 | Discharge: 2022-04-14 | Disposition: A | Discharge status: Home / Self Care | Payer: Medicare HMO | Source: Ambulatory Visit | Attending: Oncology | Admitting: Oncology

## 2022-04-14 ENCOUNTER — Telehealth: Payer: Medicare HMO | Admitting: Hospice and Palliative Medicine

## 2022-04-14 DIAGNOSIS — C3490 Malignant neoplasm of unspecified part of unspecified bronchus or lung: Secondary | ICD-10-CM | POA: Insufficient documentation

## 2022-04-14 DIAGNOSIS — R1310 Dysphagia, unspecified: Secondary | ICD-10-CM

## 2022-04-14 DIAGNOSIS — Z87891 Personal history of nicotine dependence: Secondary | ICD-10-CM | POA: Diagnosis not present

## 2022-04-14 DIAGNOSIS — C799 Secondary malignant neoplasm of unspecified site: Secondary | ICD-10-CM

## 2022-04-14 DIAGNOSIS — K222 Esophageal obstruction: Secondary | ICD-10-CM

## 2022-04-14 HISTORY — PX: IR IMAGING GUIDED PORT INSERTION: IMG5740

## 2022-04-14 MED ORDER — MIDAZOLAM HCL 2 MG/2ML IJ SOLN
INTRAMUSCULAR | Status: AC
  Filled 2022-04-14: qty 2

## 2022-04-14 MED ORDER — LIDOCAINE-PRILOCAINE 2.5-2.5 % EX CREA: 1.0000 | TOPICAL_CREAM | CUTANEOUS | 2 refills | Status: DC | PRN

## 2022-04-14 MED ORDER — MIDAZOLAM HCL 2 MG/2ML IJ SOLN
INTRAMUSCULAR | Status: AC | PRN
  Administered 2022-04-14: 1 mg via INTRAVENOUS

## 2022-04-14 MED ORDER — LIDOCAINE-EPINEPHRINE 1 %-1:100000 IJ SOLN
INTRAMUSCULAR | Status: AC
  Administered 2022-04-14: 14 mL
  Filled 2022-04-14: qty 1

## 2022-04-14 MED ORDER — FENTANYL CITRATE (PF) 100 MCG/2ML IJ SOLN
INTRAMUSCULAR | Status: AC | PRN
  Administered 2022-04-14 (×2): 25 ug via INTRAVENOUS
  Administered 2022-04-14: 50 ug via INTRAVENOUS

## 2022-04-14 MED ORDER — HEPARIN SOD (PORK) LOCK FLUSH 100 UNIT/ML IV SOLN
INTRAVENOUS | Status: AC
  Administered 2022-04-14: 500 [IU]
  Filled 2022-04-14: qty 5

## 2022-04-14 MED ORDER — SODIUM CHLORIDE 0.9 % IV SOLN
INTRAVENOUS | Status: DC
  Administered 2022-04-14: 1000 mL via INTRAVENOUS

## 2022-04-14 MED ORDER — MIDAZOLAM HCL 5 MG/5ML IJ SOLN
INTRAMUSCULAR | Status: AC | PRN
  Administered 2022-04-14 (×2): .5 mg via INTRAVENOUS

## 2022-04-14 MED ORDER — FENTANYL CITRATE (PF) 100 MCG/2ML IJ SOLN
INTRAMUSCULAR | Status: AC
  Filled 2022-04-14: qty 2

## 2022-04-14 NOTE — Procedures (Signed)
Interventional Radiology Procedure Note  Date of Procedure: 04/14/2022  Procedure: Port placement   Findings:  1. Right chest port placement    Complications: No immediate complications noted.   Estimated Blood Loss: minimal  Follow-up and Recommendations: 1. Ready for use    Albin Felling, MD  Vascular & Interventional Radiology  04/14/2022 2:34 PM

## 2022-04-14 NOTE — Progress Notes (Signed)
Patient clinically stable post Port placement right chest per Dr Denna Haggard, tolerated well. Vitals stable pre and post procedure. Received Versed 2 mg along with Fentanyl 100 mcg IV for procedure. Friend at bedside post procedure with update given. Report given to Genelle Bal Rn post procedure/330

## 2022-04-14 NOTE — H&P (Signed)
Chief Complaint: Patient was seen in consultation today for image-guided port placement  Referring Physician(s): Rao,Archana C  Supervising Physician: Juliet Rude  Patient Status: [facility] - Out-pt  History of Present Illness: Amber Glenn is a 72 y.o. female with PMH significant for diabetes mellitus, GERD, and hypertension being seen today for image-guided port placement to facilitate chemotherapy for treatment of metastatic adenocarcinoma of the lung. The patient was diagnosed with this following an US-guided lymph node biopsy performed 02/11/22 by Dr Kathlene Cote. The patient has been followed by Dr Janese Banks from Oncology, who has referred the patient to IR for image-guided port placement.  Past Medical History:  Diagnosis Date   Arthritis    hands and feet   Cataract    right   Complication of anesthesia    sometimes have a hard time waking up   Diabetes mellitus    Borderline Diabetes   GERD (gastroesophageal reflux disease)    HTN (hypertension)    Hyperlipidemia    Motion sickness    Post corneal transplant    right    Past Surgical History:  Procedure Laterality Date   APPLICATION OF INTRAOPERATIVE CT SCAN  02/15/2022   Procedure: APPLICATION OF INTRAOPERATIVE CT SCAN;  Surgeon: Meade Maw, MD;  Location: [facility] ORS;  Service: Neurosurgery;;   BASAL CELL CARCINOMA EXCISION     Dr. Manley Mason   CARPAL TUNNEL RELEASE     right   COLONOSCOPY WITH PROPOFOL N/A 12/03/2015   Procedure: COLONOSCOPY WITH PROPOFOL;  Surgeon: Manya Silvas, MD;  Location: [facility] ENDOSCOPY;  Service: Endoscopy;  Laterality: N/A;   COLONOSCOPY WITH PROPOFOL N/A 02/22/2020   Procedure: COLONOSCOPY WITH PROPOFOL;  Surgeon: Lesly Rubenstein, MD;  Location: [facility] ENDOSCOPY;  Service: Endoscopy;  Laterality: N/A;   ESOPHAGOGASTRODUODENOSCOPY (EGD) WITH PROPOFOL N/A 02/22/2020   Procedure: ESOPHAGOGASTRODUODENOSCOPY (EGD) WITH PROPOFOL;  Surgeon: Lesly Rubenstein, MD;  Location: [facility]  ENDOSCOPY;  Service: Endoscopy;  Laterality: N/A;   HAMMER TOE SURGERY Right 05/23/2019   Procedure: HAMMER TOE CORRECTION;  Surgeon: Samara Deist, DPM;  Location: [place];  Service: Podiatry;  Laterality: Right;  Diabetic   IR RADIOLOGIST EVAL & MGMT  03/24/2022   OSTECTOMY Right 05/23/2019   Procedure: DOUBLE OSTEOTOMY RIGHT;  Surgeon: Samara Deist, DPM;  Location: [place];  Service: Podiatry;  Laterality: Right;   SKIN CANCER EXCISION      Allergies: Patient has no known allergies.  Medications: Prior to Admission medications   Medication Sig Start Date End Date Taking? Authorizing Provider  apixaban (ELIQUIS) 5 MG TABS tablet Two tabs po twice a day for six days then one tab po twice a day afterwards 04/01/22  Yes Wieting, Richard, MD  Ascorbic Acid (VITAMIN C) 1000 MG tablet Take 1,000 mg by mouth daily.     Yes [provider]  oxyCODONE ER (XTAMPZA ER) 18 MG C12A Take 1 capsule by mouth every 12 (twelve) hours. 04/06/22  Yes Sindy Guadeloupe, MD  Oxycodone HCl 20 MG TABS Take 0.5-1 tablets (10-20 mg total) by mouth every 4 (four) hours as needed. 03/23/22  Yes Borders, Kirt Boys, NP  simvastatin (ZOCOR) 40 MG tablet TAKE 1 TABLET EVERY DAY  AT  6:00 PM 12/20/14  Yes Jackolyn Confer, MD  acetaminophen (TYLENOL) 500 MG tablet Take 500 mg by mouth every 6 (six) hours as needed for moderate pain, mild pain, fever or headache.    [provider]  azithromycin (ZITHROMAX) 250 MG tablet One  tab po daily for three days 04/02/22   Loletha Grayer, MD  cyclobenzaprine (FLEXERIL) 5 MG tablet Take 1 tablet (5 mg total) by mouth 3 (three) times daily as needed for muscle spasms. 03/26/22   Borders, Kirt Boys, NP  dexamethasone (DECADRON) 4 MG tablet Take 1 tab 2 times daily starting day before pemetrexed. Then take 2 tabs daily x 3 days starting day after carboplatin. Take with food. 04/06/22   Sindy Guadeloupe, MD  Docusate Calcium (STOOL SOFTENER PO) Take 1  capsule by mouth daily as needed (constipation).    [provider]  Ferrous Sulfate (IRON) 325 (65 Fe) MG TABS Take 1 tablet by mouth daily at 12 noon.    [provider]  folic acid (FOLVITE) 1 MG tablet Take 1 tablet (1 mg total) by mouth daily. Start 7 days before pemetrexed chemotherapy. Continue until 21 days after pemetrexed completed. 04/06/22   Sindy Guadeloupe, MD  gabapentin (NEURONTIN) 300 MG capsule Take 1 capsule (300 mg total) by mouth 3 (three) times daily. 03/23/22   Borders, Kirt Boys, NP  lidocaine-prilocaine (EMLA) cream Apply 1 Application topically as needed. 04/14/22   Sindy Guadeloupe, MD  LORazepam (ATIVAN) 0.5 MG tablet Take 1 tablet (0.5 mg total) by mouth every 8 (eight) hours as needed for anxiety. 03/30/22   Borders, Kirt Boys, NP  melatonin 5 MG TABS Take 1 tablet (5 mg total) by mouth at bedtime as needed. 02/18/22 05/19/22  Richarda Osmond, MD  metoprolol tartrate (LOPRESSOR) 25 MG tablet Take 1 tablet (25 mg total) by mouth 2 (two) times daily. 04/01/22   Loletha Grayer, MD  Multiple Vitamin (MULTIVITAMIN) capsule Take 1 capsule by mouth daily.      [provider]  nystatin (MYCOSTATIN) 100000 UNIT/ML suspension Use as directed 5 mLs (500,000 Units total) in the mouth or throat 4 (four) times daily. 04/01/22   Loletha Grayer, MD  omeprazole (PRILOSEC) 20 MG capsule Take 1 capsule (20 mg total) by mouth daily. 12/20/14   Jackolyn Confer, MD  ondansetron (ZOFRAN) 8 MG tablet Take 1 tablet (8 mg total) by mouth every 8 (eight) hours as needed for nausea or vomiting. 03/11/22   Borders, Kirt Boys, NP  ondansetron (ZOFRAN) 8 MG tablet Take 1 tablet (8 mg total) by mouth every 8 (eight) hours as needed for nausea or vomiting. Start on the third day after carboplatin. 04/06/22   Sindy Guadeloupe, MD  polyethylene glycol (MIRALAX / GLYCOLAX) 17 g packet Take 17 g by mouth daily. 02/18/22   Richarda Osmond, MD  prednisoLONE acetate (PRED MILD) 0.12 %  ophthalmic suspension Place 1 drop into the right eye daily.    [provider]  prochlorperazine (COMPAZINE) 10 MG tablet Take 1 tablet (10 mg total) by mouth every 6 (six) hours as needed for nausea or vomiting. 03/11/22   Borders, Kirt Boys, NP  prochlorperazine (COMPAZINE) 10 MG tablet Take 1 tablet (10 mg total) by mouth every 6 (six) hours as needed for nausea or vomiting. 04/06/22   Sindy Guadeloupe, MD  sulfamethoxazole-trimethoprim (BACTRIM DS) 800-160 MG tablet Take 1 tablet by mouth 2 (two) times daily. 04/07/22   Sindy Guadeloupe, MD     Family History  Problem Relation Age of Onset   Heart disease Mother    Heart disease Father    Heart disease Maternal Grandmother    Heart disease Maternal Grandfather    Breast cancer Sister 64    Social  History   Socioeconomic History   Marital status: Widowed    Spouse name: Not on file   Number of children: Not on file   Years of education: Not on file   Highest education level: Not on file  Occupational History    Employer: [employer]    Comment: Currently not employed  Tobacco Use   Smoking status: Former    Types: Cigarettes    Quit date: 01/13/2009    Years since quitting: 13.2   Smokeless tobacco: Never  Substance and Sexual Activity   Alcohol use: No   Drug use: No   Sexual activity: Yes    Birth control/protection: Post-menopausal  Other Topics Concern   Not on file  Social History Narrative   Not on file   Social Determinants of Health   Financial Resource Strain: Not on file  Food Insecurity: No Food Insecurity (03/31/2022)   Hunger Vital Sign    Worried About Running Out of Food in the Last Year: Never true    Ran Out of Food in the Last Year: Never true  Transportation Needs: No Transportation Needs (03/31/2022)   PRAPARE - Hydrologist (Medical): No    Lack of Transportation (Non-Medical): No  Physical Activity: Not on file  Stress: Not on file  Social Connections: Not on  file    Review of Systems: A 12 point ROS discussed and pertinent positives are indicated in the HPI above.  All other systems are negative.  Review of Systems  Constitutional:  Negative for chills and fever.  Respiratory:  Negative for chest tightness and shortness of breath.   Cardiovascular:  Negative for chest pain and leg swelling.  Gastrointestinal:  Positive for nausea. Negative for diarrhea and vomiting.  Neurological:  Negative for dizziness and headaches.  Psychiatric/Behavioral:  Negative for confusion.     Vital Signs: BP (!) 124/106   Pulse (!) 113   Temp (!) 96.6 F (35.9 C) (Axillary)   Resp 17   Ht 5' (1.524 m)   Wt 117 lb (53.1 kg)   SpO2 96%   BMI 22.85 kg/m    Physical Exam Vitals reviewed.  Constitutional:      General: She is not in acute distress. HENT:     Mouth/Throat:     Mouth: Mucous membranes are moist.  Cardiovascular:     Rate and Rhythm: Regular rhythm. Tachycardia present.     Pulses: Normal pulses.     Heart sounds: Normal heart sounds.  Pulmonary:     Effort: Pulmonary effort is normal.     Breath sounds: Normal breath sounds.  Abdominal:     General: Bowel sounds are normal.     Palpations: Abdomen is soft.     Tenderness: There is no abdominal tenderness.  Musculoskeletal:     Right lower leg: No edema.     Left lower leg: No edema.  Neurological:     Mental Status: She is alert and oriented to person, place, and time.  Psychiatric:        Mood and Affect: Mood normal.        Behavior: Behavior normal.     Imaging: ECHOCARDIOGRAM COMPLETE  Result Date: 04/01/2022    ECHOCARDIOGRAM REPORT   Patient Name:   PERNELLA ACKERLEY Date of Exam: 04/01/2022 Medical Rec #:  [id number]      Height:       60.0 in Accession #:    [id number]  Weight:       119.0 lb Date of Birth:  05-Jul-1950     BSA:          1.497 m Patient Age:    6 years       BP:           123/49 mmHg Patient Gender: F              HR:           105 bpm. Exam  Location:  [facility] Procedure: 2D Echo, Color Doppler and Cardiac Doppler Indications:     I26.09 Pulmonary Embolus  History:         Patient has no prior history of Echocardiogram examinations.                  Risk Factors:Hypertension, Diabetes and Dyslipidemia.  Sonographer:     Charmayne Sheer Referring Phys:  [place] Diagnosing Phys: Ida Rogue MD IMPRESSIONS  1. Left ventricular ejection fraction, by estimation, is 50 to 55%. The left ventricle has low normal function. The left ventricle has no regional wall motion abnormalities. Left ventricular diastolic parameters are consistent with Grade II diastolic dysfunction (pseudonormalization).  2. Right ventricular systolic function is normal. The right ventricular size is normal. Tricuspid regurgitation signal is inadequate for assessing PA pressure.  3. The mitral valve is normal in structure. Mild mitral valve regurgitation. No evidence of mitral stenosis.  4. The aortic valve is tricuspid. Aortic valve regurgitation is not visualized. Aortic valve sclerosis is present, with no evidence of aortic valve stenosis.  5. The inferior vena cava is normal in size with greater than 50% respiratory variability, suggesting right atrial pressure of 3 mmHg. FINDINGS  Left Ventricle: Left ventricular ejection fraction, by estimation, is 50 to 55%. The left ventricle has low normal function. The left ventricle has no regional wall motion abnormalities. The left ventricular internal cavity size was normal in size. There is no left ventricular hypertrophy. Left ventricular diastolic parameters are consistent with Grade II diastolic dysfunction (pseudonormalization). Right Ventricle: The right ventricular size is normal. No increase in right ventricular wall thickness. Right ventricular systolic function is normal. Tricuspid regurgitation signal is inadequate for assessing PA pressure. Left Atrium: Left atrial size was normal in size. Right Atrium: Right atrial size  was normal in size. Pericardium: There is no evidence of pericardial effusion. Mitral Valve: The mitral valve is normal in structure. Mild mitral valve regurgitation. No evidence of mitral valve stenosis. Tricuspid Valve: The tricuspid valve is normal in structure. Tricuspid valve regurgitation is mild . No evidence of tricuspid stenosis. Aortic Valve: The aortic valve is tricuspid. Aortic valve regurgitation is not visualized. Aortic valve sclerosis is present, with no evidence of aortic valve stenosis. Aortic valve mean gradient measures 3.0 mmHg. Aortic valve peak gradient measures 5.3  mmHg. Aortic valve area, by VTI measures 2.39 cm. Pulmonic Valve: The pulmonic valve was normal in structure. Pulmonic valve regurgitation is not visualized. No evidence of pulmonic stenosis. Aorta: The aortic root is normal in size and structure. Venous: The inferior vena cava is normal in size with greater than 50% respiratory variability, suggesting right atrial pressure of 3 mmHg. IAS/Shunts: No atrial level shunt detected by color flow Doppler.  LEFT VENTRICLE PLAX 2D LVIDd:         4.20 cm   Diastology LVIDs:         3.20 cm   LV e' medial:    5.77 cm/s LV  PW:         1.00 cm   LV E/e' medial:  19.9 LV IVS:        0.60 cm   LV e' lateral:   5.33 cm/s LVOT diam:     2.00 cm   LV E/e' lateral: 21.6 LV SV:         53 LV SV Index:   35 LVOT Area:     3.14 cm  RIGHT VENTRICLE RV Basal diam:  3.50 cm RV S prime:     9.90 cm/s LEFT ATRIUM           Index        RIGHT ATRIUM           Index LA diam:      2.80 cm 1.87 cm/m   RA Area:     11.90 cm LA Vol (A2C): 29.0 ml 19.37 ml/m  RA Volume:   28.60 ml  19.10 ml/m LA Vol (A4C): 37.5 ml 25.05 ml/m  AORTIC VALVE                    PULMONIC VALVE AV Area (Vmax):    2.16 cm     PV Vmax:       1.04 m/s AV Area (Vmean):   2.28 cm     PV Vmean:      72.400 cm/s AV Area (VTI):     2.39 cm     PV VTI:        0.188 m AV Vmax:           115.00 cm/s  PV Peak grad:  4.3 mmHg AV Vmean:           85.400 cm/s  PV Mean grad:  2.0 mmHg AV VTI:            0.221 m AV Peak Grad:      5.3 mmHg AV Mean Grad:      3.0 mmHg LVOT Vmax:         79.00 cm/s LVOT Vmean:        61.900 cm/s LVOT VTI:          0.168 m LVOT/AV VTI ratio: 0.76  AORTA Ao Root diam: 2.60 cm MITRAL VALVE MV Area (PHT): 6.87 cm     SHUNTS MV Decel Time: 111 msec     Systemic VTI:  0.17 m MV E velocity: 115.00 cm/s  Systemic Diam: 2.00 cm Ida Rogue MD Electronically signed by Ida Rogue MD Signature Date/Time: 04/01/2022/1:10:44 PM    Final    US Venous Img Lower Bilateral (DVT)  Result Date: 03/31/2022 CLINICAL DATA:  PEs.  Evaluate for DVT. EXAM: BILATERAL LOWER EXTREMITY VENOUS DOPPLER ULTRASOUND TECHNIQUE: Gray-scale sonography with graded compression, as well as color Doppler and duplex ultrasound were performed to evaluate the lower extremity deep venous systems from the level of the common femoral vein and including the common femoral, femoral, profunda femoral, popliteal and calf veins including the posterior tibial, peroneal and gastrocnemius veins when visible. The superficial great saphenous vein was also interrogated. Spectral Doppler was utilized to evaluate flow at rest and with distal augmentation maneuvers in the common femoral, femoral and popliteal veins. COMPARISON:  CTA PE, concurrent. FINDINGS: RIGHT LOWER EXTREMITY VENOUS Normal compressibility of the RIGHT common femoral, superficial femoral, and popliteal veins, as well as the visualized calf veins. Visualized portions of profunda femoral vein and great saphenous vein unremarkable. No filling defects to suggest DVT on grayscale  or color Doppler imaging. Doppler waveforms show normal direction of venous flow, normal respiratory plasticity and response to augmentation. OTHER No abnormal fluid collection. Heterogeneously-echogenic filling defect with incomplete coaptation within the imaged portion of the LEFT greater saphenous vein. Limitations: none LEFT  LOWER EXTREMITY VENOUS Normal compressibility of the LEFT common femoral, superficial femoral, and popliteal veins, as well as the visualized calf veins. Visualized portions of profunda femoral vein and great saphenous vein unremarkable. No filling defects to suggest DVT on grayscale or color Doppler imaging. Doppler waveforms show normal direction of venous flow, normal respiratory plasticity and response to augmentation. OTHER No evidence of superficial thrombophlebitis or abnormal fluid collection. Limitations: none IMPRESSION: 1. No evidence of femoropopliteal DVT within either lower extremity. 2. Examination is POSITIVE for superficial thrombophlebitis within the LEFT greater saphenous vein. Michaelle Birks, MD [facility] Radiology Electronically Signed   By: Michaelle Birks M.D.   On: 03/31/2022 09:34   CT Angio Chest PE W and/or Wo Contrast  Result Date: 03/31/2022 CLINICAL DATA:  Evaluate for pulmonary embolism. History of stage IV lung cancer. * Tracking Code: BO *. EXAM: CT ANGIOGRAPHY CHEST WITH CONTRAST TECHNIQUE: Multidetector CT imaging of the chest was performed using the standard protocol during bolus administration of intravenous contrast. Multiplanar CT image reconstructions and MIPs were obtained to evaluate the vascular anatomy. RADIATION DOSE REDUCTION: This exam was performed according to the departmental dose-optimization program which includes automated exposure control, adjustment of the mA and/or kV according to patient size and/or use of iterative reconstruction technique. CONTRAST:  16mL OMNIPAQUE IOHEXOL 350 MG/ML SOLN COMPARISON:  02/10/2022 FINDINGS: Cardiovascular: Exam detail is diminished due 1 to motion artifact. There is a single, small filling defect within a segmental branch of the right middle lobe pulmonary artery, image 42/7. Heart size appears normal. No pericardial effusion. Aortic atherosclerosis and coronary artery  calcifications. Mediastinum/Nodes: Thyroid gland, trachea, and esophagus are unremarkable. Index anterior mediastinal, prevascular mass measures 3.2 x 3.2 cm, image 104/6. Previously 3.1 by 2.2 cm. Subcarinal lymph node measures 1.4 cm, image 172/6. Previously 0.8 cm. Lungs/Pleura: Small left pleural effusion is new from previous exam. Progressive pleural thickening overlying the posterior left lower lobe. New heterogeneous ground-glass attenuation is scattered throughout both lungs. Progressive areas of atelectasis and architectural distortion noted within the left upper lobe and left lower lobe. Multiple new nodules are scattered throughout both lungs in worrisome for progression of disease. Index nodules include, including: Posteromedial left upper lobe nodule measures 7 mm, image 40/5. Posterolateral right upper lobe lung nodule measures 4 mm, image 30/5. Superior segment left lower lobe lung nodule measures 4 mm, image 54/5. Right lower lobe lung nodule measures 5 mm, image 69/5. Left lower lobe lung nodule measures 8 mm, image 70/5. Upper Abdomen: No acute abnormality. Musculoskeletal: No chest wall abnormality. No acute or significant osseous findings. Review of the MIP images confirms the above findings. IMPRESSION: 1. Exam detail is diminished due to motion artifact. There is a single, small filling defect within a segmental branch of the right middle lobe pulmonary artery compatible with acute pulmonary embolus. 2. Interval increase in size of anterior mediastinal soft tissue mass. There is also been increase in size of subcarinal lymph node. 3. Multiple new nodules are scattered throughout both lungs in worrisome for pulmonary metastasis. 4. New small left pleural effusion with progressive pleural thickening overlying the posterior left lower lobe. 5. New heterogeneous ground-glass attenuation scattered throughout both lungs. Findings are nonspecific and may  be inflammatory or infectious in etiology. 6.  Progressive areas of atelectasis and architectural distortion noted within the left upper lobe and left lower lobe. 7. Coronary artery calcifications. 8.  Aortic Atherosclerosis (ICD10-I70.0). Critical Value/emergent results were called by telephone at the time of interpretation on 03/31/2022 at 7:40 am to provider Dr. Jori Moll, who verbally acknowledged these results. Electronically Signed   By: Kerby Moors M.D.   On: 03/31/2022 07:41   DG Chest Portable 1 View  Result Date: 03/31/2022 CLINICAL DATA:  Shortness of breath. EXAM: PORTABLE CHEST 1 VIEW COMPARISON:  Aug 18, 2019 FINDINGS: The heart size and mediastinal contours are within normal limits. Low lung volumes are noted. Moderate severity linear atelectasis and/or infiltrate is seen within the left lung base. There is no evidence of a pleural effusion or pneumothorax. Postoperative changes are seen within the lower thoracic spine. IMPRESSION: Moderate severity left basilar linear atelectasis and/or infiltrate. Electronically Signed   By: Virgina Norfolk M.D.   On: 03/31/2022 06:00   DG Lumbar Spine Complete  Result Date: 03/30/2022 CLINICAL DATA:  Fall.  History of compression fracture EXAM: LUMBAR SPINE - COMPLETE 4+ VIEW COMPARISON:  Lumbar spine radiographs 03/08/2022. CT lumbar spine 03/16/2022 FINDINGS: Lytic lesion L1 vertebral body best seen on CT. This area is obscured by barium in the left colon on the lateral view. This is associated with L1 compression fracture. No new fracture identified. Bilateral pedicle screw and rod fusion T11 through L3. Hardware in satisfactory position. IMPRESSION: Lytic lesion L1 with pathologic fracture unchanged. Posterior fusion hardware in satisfactory position. No new fracture. Electronically Signed   By: Franchot Gallo M.D.   On: 03/30/2022 14:54   DG Thoracic Spine 2 View  Result Date: 03/30/2022 CLINICAL DATA:  Provided history: 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 site,  unspecified laterality. Neoplasm related pain. EXAM: THORACIC SPINE 2 VIEWS COMPARISON:  CT of the thoracic spine 02/10/2022. FINDINGS: Mild Dextrocurvature of the upper thoracic spine. No significant spondylolisthesis. No appreciable thoracic vertebral compression fracture. Thoracic spondylosis with multilevel disc space narrowing, degenerative endplate irregularity and degenerative endplate sclerosis. Posterior spinal fusion construct spanning the T11-L3 levels. Known pathologic L1 vertebral compression fracture better assessed on same day lumbar spine radiographs. Incidentally noted, there is residual contrast within the colon. Aortic atherosclerosis. IMPRESSION: 1. No appreciable thoracic vertebral compression fracture. 2. Mild Dextrocurvature of the upper thoracic spine, possibly positional. 3. Thoracic spondylosis, as described. 4. Posterior spinal fusion construct spanning the T11-L3 levels. Known pathologic L1 vertebral compression fracture better assessed on the same day lumbar spine radiographs. 5. Aortic Atherosclerosis (ICD10-I70.0). Electronically Signed   By: Kellie Simmering D.O.   On: 03/30/2022 14:01   DG THORACOLUMABAR SPINE  Result Date: 03/28/2022 CLINICAL DATA:  T11-L3 fusion.  Back pain. EXAM: THORACOLUMBAR SPINE 1V COMPARISON:  Lumbar spine CT 03/16/2022 FINDINGS: There is dense contrast material throughout the entire colon which limits evaluation, particularly on the lateral view. Posterior fusion rods are seen bilaterally with transpedicular screws at T11, T12, L2 and L3, unchanged in position. There is no evidence for hardware loosening. Lucent lesions throughout the L1 vertebral body are grossly unchanged. Alignment is grossly stable with grade 1 anterolisthesis at L4-L5. IMPRESSION: 1. Stable T11-L3 posterior fusion. No evidence for hardware loosening. 2. Lucent lesions throughout the L1 vertebral body are grossly unchanged. Electronically Signed   By: Ronney Asters M.D.   On: 03/28/2022  00:21   IR Radiologist Eval & Mgmt  Result Date: 03/24/2022 EXAM: NEW  PATIENT OFFICE VISIT CHIEF COMPLAINT: See below HISTORY OF PRESENT ILLNESS: See below REVIEW OF SYSTEMS: See below PHYSICAL EXAMINATION: See below ASSESSMENT AND PLAN: Please refer to completed note in the electronic medical record on Juniata Mugweru, MD [facility] Radiology Electronically Signed   By: Michaelle Birks M.D.   On: 03/24/2022 13:32   DG ESOPHAGUS W DOUBLE CM (HD)  Result Date: 03/24/2022 CLINICAL DATA:  Dysphagia, lung cancer EXAM: ESOPHOGRAM / BARIUM SWALLOW / BARIUM TABLET STUDY TECHNIQUE: Combined double contrast and single contrast examination performed using effervescent crystals, thick barium liquid, and thin barium liquid. The patient was observed with fluoroscopy swallowing a 13 mm barium sulphate tablet. FLUOROSCOPY: Radiation Exposure Index (as provided by the fluoroscopic device): 5 mGy COMPARISON:  None Available. FINDINGS: Normal pharyngeal anatomy and motility. Contrast flowed freely through the esophagus without evidence of a mass. Normal esophageal mucosa without evidence of irregularity or ulceration. Mild tertiary contractions of the esophagus as can be seen with mild spasm. Mild gastroesophageal reflux. Mild relative narrowing of the distal esophagus just proximal to the gastroesophageal junction concerning for a mild stricture restricting the passage of a 13 mm barium tablet. No definite hiatal hernia was demonstrated. IMPRESSION: 1. Mild relative narrowing of the distal esophagus just proximal to the gastroesophageal junction concerning for a mild stricture restricting the passage of a 13 mm barium tablet. 2. Mild tertiary contractions of the esophagus as can be seen with mild spasm. Electronically Signed   By: Kathreen Devoid M.D.   On: 03/24/2022 10:17   CT Lumbar Spine Wo Contrast  Result Date: 03/16/2022 CLINICAL DATA:  Musculoskeletal  neoplasm. Persistent back pain. Evaluate for feasibility of RFA. EXAM: CT LUMBAR SPINE WITHOUT CONTRAST TECHNIQUE: Multidetector CT imaging of the lumbar spine was performed without intravenous contrast administration. Multiplanar CT image reconstructions were also generated. RADIATION DOSE REDUCTION: This exam was performed according to the departmental dose-optimization program which includes automated exposure control, adjustment of the mA and/or kV according to patient size and/or use of iterative reconstruction technique. COMPARISON:  CT lumbar spine dated February 10, 2022 FINDINGS: Segmentation: 5 lumbar type vertebrae. Alignment: Grade 1 anterolisthesis of L4. Vertebrae: Pathological fracture of L1 vertebral body with multiple lucencies involving the vertebral bodies and bilateral pedicles is again noted. Persistent approximately 5 mm bulge of the vertebral body into the spinal canal. Posterior spinal fusion with bipedicular screws from T11-L3, sparing the L1. Paraspinal and other soft tissues: Atherosclerotic calcification of abdominal aorta and branch vessels. No acute abnormality. Disc levels: T12-L1: No significant disc bulge, spinal canal or neural foraminal stenosis. L1-L2: No significant disc bulge, spinal canal or neural foraminal stenosis. L2-L3: No significant disc bulge, spinal canal or neural foraminal stenosis. L3-L4: Disc height loss and disc osteophyte complex with mild disc bulge. Mild bilateral facet joint arthropathy. Mild narrowing of lateral recesses bilaterally. No significant neural foraminal stenosis. L4-L5: Anterolisthesis of L4. Broad-based disc bulge and moderate bilateral facet joint arthropathy. Narrowing of lateral recesses bilaterally. Mild bilateral neural foraminal stenosis. L5-S1: Broad-based disc bulge and moderate facet joint arthropathy. Mild bilateral lateral recess stenosis. No significant neural foraminal stenosis IMPRESSION: 1. Pathological fracture of L1 vertebral body  with multiple lucencies involving the vertebral bodies and bilateral pedicles is again noted. Persistent approximately 5 mm bulge of the vertebral body into the spinal canal. 2. Posterior spinal fusion with bipedicular screws from T11-L3, sparing the L1. 3. Multilevel degenerative disc disease with disc osteophyte complex at L3-L4,  L4-L5 and L5-S1 with narrowing of lateral recesses bilaterally at L3-L4, L4-L5 and L5-S1. 4. Aortic atherosclerosis. Aortic Atherosclerosis (ICD10-I70.0). Electronically Signed   By: Keane Police D.O.   On: 03/16/2022 11:35    Labs:  CBC: Recent Labs    02/17/22 0901 02/18/22 0346 02/24/22 1040 03/31/22 0531  WBC 12.7* 9.1 13.7* 12.1*  HGB 9.5* 8.9* 11.4* 10.5*  HCT 30.7* 28.1* 37.5 35.5*  PLT 270 272 333 223    COAGS: No results for input(s): "INR", "APTT" in the last 8760 hours.  BMP: Recent Labs    02/17/22 0901 02/18/22 0346 02/24/22 1040 03/31/22 0531  NA 134* 134* 132* 134*  K 4.0 3.9 4.3 4.5  CL 104 102 98 101  CO2 23 24 24 26   GLUCOSE 106* 101* 141* 122*  BUN 10 9 13  26*  CALCIUM 9.4 8.9 9.7 9.1  CREATININE 0.57 0.46 0.58 0.71  GFRNONAA >60 >60 >60 >60    LIVER FUNCTION TESTS: Recent Labs    02/24/22 1040 03/31/22 0531  BILITOT 0.4 0.8  AST 20 21  ALT 15 19  ALKPHOS 94 74  PROT 6.8 6.3*  ALBUMIN 3.4* 3.2*    TUMOR MARKERS: No results for input(s): "AFPTM", "CEA", "CA199", "CHROMGRNA" in the last 8760 hours.  Assessment and Plan:  Teffany Blaszczyk is a 72 yo female with metastatic adenocarcinoma of the lung being seen today for image-guided port placement. The patient is known to IR from previous biopsy and consult for kyphoplasty procedure. The case has been reviewed with Dr Denna Haggard and is set to proceed on 04/14/21.   Risks and benefits of image guided port-a-catheter placement was discussed with the patient including, but not limited to bleeding, infection, pneumothorax, or fibrin sheath development and need for additional  procedures.  All of the patient's questions were answered, patient is agreeable to proceed. Consent signed and in chart.   Thank you for this interesting consult.  I greatly enjoyed meeting DANIJAH NOH and look forward to participating in their care.  A copy of this report was sent to the requesting provider on this date.  Electronically Signed: Lura Em, PA-C 04/14/2022, 12:55 PM   I spent a total of    25 Minutes in face to face in clinical consultation, greater than 50% of which was counseling/coordinating care for image-guided port placement.

## 2022-04-14 NOTE — Telephone Encounter (Signed)
Received call from pt's daughter, [name], asking if pt can get portable oxygen concentrator ordered since the oxygen tanks that she currently has is too large for her to transport. Informed that will discuss with [employer] to see if they have one available that can be ordered for her. Also, pt continues to have difficulty swallowing food and becomes nauseated after eating. Per chart notes, pt needs referral to GI to further evaluate esophageal stricture found on barium swallow. Referral placed and daughter informed to expect call from their office with an appt. Pt getting port placed this afternoon and requests prescription for emla cream. Rx sent into pharmacy.

## 2022-04-15 ENCOUNTER — Inpatient Hospital Stay: Payer: Medicare HMO | Attending: Hospice and Palliative Medicine | Admitting: Hospice and Palliative Medicine

## 2022-04-15 ENCOUNTER — Ambulatory Visit: Payer: Medicare HMO | Admitting: Radiation Oncology

## 2022-04-15 ENCOUNTER — Other Ambulatory Visit: Payer: Self-pay | Admitting: *Deleted

## 2022-04-15 DIAGNOSIS — C3490 Malignant neoplasm of unspecified part of unspecified bronchus or lung: Secondary | ICD-10-CM | POA: Diagnosis not present

## 2022-04-15 DIAGNOSIS — Z515 Encounter for palliative care: Secondary | ICD-10-CM | POA: Diagnosis not present

## 2022-04-15 DIAGNOSIS — Z5111 Encounter for antineoplastic chemotherapy: Secondary | ICD-10-CM | POA: Insufficient documentation

## 2022-04-15 DIAGNOSIS — Z79899 Other long term (current) drug therapy: Secondary | ICD-10-CM | POA: Insufficient documentation

## 2022-04-15 DIAGNOSIS — C7951 Secondary malignant neoplasm of bone: Secondary | ICD-10-CM | POA: Insufficient documentation

## 2022-04-15 DIAGNOSIS — D509 Iron deficiency anemia, unspecified: Secondary | ICD-10-CM | POA: Insufficient documentation

## 2022-04-15 DIAGNOSIS — E1136 Type 2 diabetes mellitus with diabetic cataract: Secondary | ICD-10-CM | POA: Insufficient documentation

## 2022-04-15 DIAGNOSIS — G893 Neoplasm related pain (acute) (chronic): Secondary | ICD-10-CM | POA: Diagnosis not present

## 2022-04-15 DIAGNOSIS — Z5112 Encounter for antineoplastic immunotherapy: Secondary | ICD-10-CM | POA: Insufficient documentation

## 2022-04-15 DIAGNOSIS — R634 Abnormal weight loss: Secondary | ICD-10-CM | POA: Insufficient documentation

## 2022-04-15 DIAGNOSIS — I10 Essential (primary) hypertension: Secondary | ICD-10-CM | POA: Insufficient documentation

## 2022-04-15 DIAGNOSIS — R1310 Dysphagia, unspecified: Secondary | ICD-10-CM | POA: Insufficient documentation

## 2022-04-15 MED ORDER — CELECOXIB 200 MG PO CAPS: 200.0000 mg | ORAL_CAPSULE | Freq: Two times a day (BID) | ORAL | 1 refills | Status: DC

## 2022-04-15 MED ORDER — OXYCODONE HCL 20 MG PO TABS: 10.0000 mg | ORAL_TABLET | ORAL | 0 refills | Status: DC | PRN

## 2022-04-15 MED ORDER — XTAMPZA ER 18 MG PO C12A: 1.0000 | EXTENDED_RELEASE_CAPSULE | Freq: Two times a day (BID) | ORAL | 0 refills | Status: DC

## 2022-04-15 MED FILL — Dexamethasone Sodium Phosphate Inj 100 MG/10ML: INTRAMUSCULAR | Qty: 1 | Status: AC

## 2022-04-15 MED FILL — Fosaprepitant Dimeglumine For IV Infusion 150 MG (Base Eq): INTRAVENOUS | Qty: 5 | Status: AC

## 2022-04-15 NOTE — Progress Notes (Signed)
Virtual Visit via Telephone Note  I connected with Amber Glenn on 04/15/22 at  1:50 PM EST by telephone and verified that I am speaking with the correct person using two identifiers.  Location: Patient: Home Provider: Clinic   I discussed the limitations, risks, security and privacy concerns of performing an evaluation and management service by telephone and the availability of in person appointments. I also discussed with the patient that there may be a patient responsible charge related to this service. The patient expressed understanding and agreed to proceed.   History of Present Illness: Amber Glenn is a 72 y.o. female with multiple medical problems including diabetes, hypertension, hyperlipidemia, depression Patient was hospitalized 02/10/2022 to 02/18/2022 with back pain.  MRI of the lumbar spine revealed an acute pathologic compression fracture of L1 and patient ultimately required percutaneous fixation by neurosurgery.  CT of the chest, abdomen, and pelvis revealed a centrally necrotic anterior mediastinal mass with phrenic nerve impingement and diaphragmatic dysfunction, scattered bilateral lung opacities, hypoenhancement in the inferior pancreatic head/body.  Patient underwent lymph node biopsy positive for adenocarcinoma of the lung.  Patient's hospitalization was complicated by pain and delirium.  Palliative care was consulted to help address goals and manage ongoing symptoms.    Observations/Objective: Patient was hospitalized in December with pneumonia and pulmonary embolism.  I called and spoke with patient and daughter.    Patient doing okay per daughter's report.  There has been improvement since patient has been home from the hospital.  Pain has been recently stable on Xtampza/oxycodone IR.  Patient had port placed yesterday with some postprocedural delirium per daughter but that has resolved today.  Daughter request refills for Xtampza and oxycodone  Of note, daughter  says that she did not increase the dose of the gabapentin to 300 mg 3 times daily as read previously discussed.  We agreed to leave it at 100 mg 3 times daily today.  Assessment and Plan: Neoplasm related pain continue gabapentin.  Refill Xtampza and oxycodone.  Stage IV adenocarcinoma lung -patient has follow-up scheduled with Dr. Janese Banks tomorrow for treatment  Follow Up Instructions: Follow-up telephone visit 3 to 4 weeks   I discussed the assessment and treatment plan with the patient. The patient was provided an opportunity to ask questions and all were answered. The patient agreed with the plan and demonstrated an understanding of the instructions.   The patient was advised to call back or seek an in-person evaluation if the symptoms worsen or if the condition fails to improve as anticipated.  I provided 10 minutes of non-face-to-face time during this encounter.   [name], NP

## 2022-04-15 NOTE — Addendum Note (Signed)
Addended by: Telford Nab on: 04/15/2022 02:31 PM   Modules accepted: Orders

## 2022-04-16 ENCOUNTER — Inpatient Hospital Stay: Payer: Medicare HMO

## 2022-04-16 ENCOUNTER — Encounter: Payer: Self-pay | Admitting: *Deleted

## 2022-04-16 ENCOUNTER — Encounter: Payer: Self-pay | Admitting: Oncology

## 2022-04-16 ENCOUNTER — Inpatient Hospital Stay (HOSPITAL_BASED_OUTPATIENT_CLINIC_OR_DEPARTMENT_OTHER): Payer: Medicare HMO | Admitting: Oncology

## 2022-04-16 VITALS — BP 119/64 | HR 119 | Temp 97.4°F | Resp 16 | Wt 112.0 lb

## 2022-04-16 DIAGNOSIS — Z79899 Other long term (current) drug therapy: Secondary | ICD-10-CM | POA: Diagnosis not present

## 2022-04-16 DIAGNOSIS — D508 Other iron deficiency anemias: Secondary | ICD-10-CM

## 2022-04-16 DIAGNOSIS — C7951 Secondary malignant neoplasm of bone: Secondary | ICD-10-CM | POA: Diagnosis not present

## 2022-04-16 DIAGNOSIS — R634 Abnormal weight loss: Secondary | ICD-10-CM | POA: Diagnosis not present

## 2022-04-16 DIAGNOSIS — C3490 Malignant neoplasm of unspecified part of unspecified bronchus or lung: Secondary | ICD-10-CM | POA: Diagnosis not present

## 2022-04-16 DIAGNOSIS — D509 Iron deficiency anemia, unspecified: Secondary | ICD-10-CM | POA: Insufficient documentation

## 2022-04-16 DIAGNOSIS — Z5112 Encounter for antineoplastic immunotherapy: Secondary | ICD-10-CM

## 2022-04-16 DIAGNOSIS — Z5111 Encounter for antineoplastic chemotherapy: Secondary | ICD-10-CM | POA: Insufficient documentation

## 2022-04-16 DIAGNOSIS — C3491 Malignant neoplasm of unspecified part of right bronchus or lung: Secondary | ICD-10-CM

## 2022-04-16 DIAGNOSIS — I10 Essential (primary) hypertension: Secondary | ICD-10-CM | POA: Diagnosis not present

## 2022-04-16 DIAGNOSIS — R1310 Dysphagia, unspecified: Secondary | ICD-10-CM | POA: Diagnosis not present

## 2022-04-16 DIAGNOSIS — E1136 Type 2 diabetes mellitus with diabetic cataract: Secondary | ICD-10-CM | POA: Insufficient documentation

## 2022-04-16 LAB — CBC WITH DIFFERENTIAL/PLATELET
Abs Immature Granulocytes: 0.04 10*3/uL (ref 0.00–0.07)
Basophils Absolute: 0 10*3/uL (ref 0.0–0.1)
Basophils Relative: 0 %
Eosinophils Absolute: 0 10*3/uL (ref 0.0–0.5)
Eosinophils Relative: 0 %
HCT: 31.6 % — ABNORMAL LOW (ref 36.0–46.0)
Hemoglobin: 10.1 g/dL — ABNORMAL LOW (ref 12.0–15.0)
Immature Granulocytes: 1 %
Lymphocytes Relative: 14 %
Lymphs Abs: 0.9 10*3/uL (ref 0.7–4.0)
MCH: 25 pg — ABNORMAL LOW (ref 26.0–34.0)
MCHC: 32 g/dL (ref 30.0–36.0)
MCV: 78.2 fL — ABNORMAL LOW (ref 80.0–100.0)
Monocytes Absolute: 0.5 10*3/uL (ref 0.1–1.0)
Monocytes Relative: 8 %
Neutro Abs: 5 10*3/uL (ref 1.7–7.7)
Neutrophils Relative %: 77 %
Platelets: 405 10*3/uL — ABNORMAL HIGH (ref 150–400)
RBC: 4.04 MIL/uL (ref 3.87–5.11)
RDW: 16.8 % — ABNORMAL HIGH (ref 11.5–15.5)
WBC: 6.6 10*3/uL (ref 4.0–10.5)
nRBC: 0 % (ref 0.0–0.2)

## 2022-04-16 LAB — COMPREHENSIVE METABOLIC PANEL
ALT: 16 U/L (ref 0–44)
AST: 21 U/L (ref 15–41)
Albumin: 3.5 g/dL (ref 3.5–5.0)
Alkaline Phosphatase: 62 U/L (ref 38–126)
Anion gap: 6 (ref 5–15)
BUN: 24 mg/dL — ABNORMAL HIGH (ref 8–23)
CO2: 31 mmol/L (ref 22–32)
Calcium: 9.7 mg/dL (ref 8.9–10.3)
Chloride: 91 mmol/L — ABNORMAL LOW (ref 98–111)
Creatinine, Ser: 0.66 mg/dL (ref 0.44–1.00)
GFR, Estimated: >60 mL/min (ref >60)
Glucose, Bld: 138 mg/dL — ABNORMAL HIGH (ref 70–99)
Potassium: 4.6 mmol/L (ref 3.5–5.1)
Sodium: 128 mmol/L — ABNORMAL LOW (ref 135–145)
Total Bilirubin: 0.2 mg/dL — ABNORMAL LOW (ref 0.3–1.2)
Total Protein: 7.2 g/dL (ref 6.5–8.1)

## 2022-04-16 LAB — TSH: TSH: 1.259 u[IU]/mL (ref 0.350–4.500)

## 2022-04-16 MED ORDER — SODIUM CHLORIDE 0.9 % IV SOLN
Freq: Once | INTRAVENOUS | Status: AC
  Filled 2022-04-16: qty 250

## 2022-04-16 MED ORDER — PALONOSETRON HCL INJECTION 0.25 MG/5ML
0.2500 mg | Freq: Once | INTRAVENOUS | Status: AC
  Administered 2022-04-16: 0.25 mg via INTRAVENOUS
  Filled 2022-04-16: qty 5

## 2022-04-16 MED ORDER — HEPARIN SOD (PORK) LOCK FLUSH 100 UNIT/ML IV SOLN
500.0000 [IU] | Freq: Once | INTRAVENOUS | Status: DC | PRN
  Filled 2022-04-16: qty 5

## 2022-04-16 MED ORDER — SODIUM CHLORIDE 0.9 % IV SOLN
500.0000 mg/m2 | Freq: Once | INTRAVENOUS | Status: AC
  Administered 2022-04-16: 800 mg via INTRAVENOUS
  Filled 2022-04-16: qty 20

## 2022-04-16 MED ORDER — SODIUM CHLORIDE 0.9 % IV SOLN
10.0000 mg | Freq: Once | INTRAVENOUS | Status: AC
  Administered 2022-04-16: 10 mg via INTRAVENOUS
  Filled 2022-04-16: qty 10

## 2022-04-16 MED ORDER — SODIUM CHLORIDE 0.9 % IV SOLN
200.0000 mg | INTRAVENOUS | Status: DC
  Administered 2022-04-16: 200 mg via INTRAVENOUS
  Filled 2022-04-16: qty 200

## 2022-04-16 MED ORDER — SODIUM CHLORIDE 0.9% FLUSH
10.0000 mL | INTRAVENOUS | Status: DC | PRN
  Administered 2022-04-16: 10 mL
  Filled 2022-04-16: qty 10

## 2022-04-16 MED ORDER — SODIUM CHLORIDE 0.9 % IV SOLN
200.0000 mg | Freq: Once | INTRAVENOUS | Status: AC
  Administered 2022-04-16: 200 mg via INTRAVENOUS
  Filled 2022-04-16: qty 8

## 2022-04-16 MED ORDER — SODIUM CHLORIDE 0.9 % IV SOLN
338.5000 mg | Freq: Once | INTRAVENOUS | Status: AC
  Administered 2022-04-16: 340 mg via INTRAVENOUS
  Filled 2022-04-16: qty 34

## 2022-04-16 MED ORDER — SODIUM CHLORIDE 0.9 % IV SOLN
150.0000 mg | Freq: Once | INTRAVENOUS | Status: AC
  Administered 2022-04-16: 150 mg via INTRAVENOUS
  Filled 2022-04-16: qty 150

## 2022-04-16 MED ORDER — CYANOCOBALAMIN 1000 MCG/ML IJ SOLN
1000.0000 ug | Freq: Once | INTRAMUSCULAR | Status: AC
  Administered 2022-04-16: 1000 ug via INTRAMUSCULAR
  Filled 2022-04-16: qty 1

## 2022-04-16 NOTE — Progress Notes (Signed)
Per MD ok to proceed with new chemo with HR 119

## 2022-04-16 NOTE — Patient Instructions (Signed)
[facility] AT [place]  Discharge Instructions: Thank you for choosing [facility] to provide your oncology and hematology care.  If you have a lab appointment with the [facility], please go directly to the [facility] and check in at the registration area.  Wear comfortable clothing and clothing appropriate for easy access to any Portacath or PICC line.   We strive to give you quality time with your provider. You may need to reschedule your appointment if you arrive late (15 or more minutes).  Arriving late affects you and other patients whose appointments are after yours.  Also, if you miss three or more appointments without notifying the office, you may be dismissed from the clinic at the provider's discretion.      For prescription refill requests, have your pharmacy contact our office and allow 72 hours for refills to be completed.    Today you received the following chemotherapy and/or immunotherapy agents: CARBOplatin, PEMEtrexed & pembrolizumab   To help prevent nausea and vomiting after your treatment, we encourage you to take your nausea medication as directed.  BELOW ARE SYMPTOMS THAT SHOULD BE REPORTED IMMEDIATELY: *FEVER GREATER THAN 100.4 F (38 C) OR HIGHER *CHILLS OR SWEATING *NAUSEA AND VOMITING THAT IS NOT CONTROLLED WITH YOUR NAUSEA MEDICATION *UNUSUAL SHORTNESS OF BREATH *UNUSUAL BRUISING OR BLEEDING *URINARY PROBLEMS (pain or burning when urinating, or frequent urination) *BOWEL PROBLEMS (unusual diarrhea, constipation, pain near the anus) TENDERNESS IN MOUTH AND THROAT WITH OR WITHOUT PRESENCE OF ULCERS (sore throat, sores in mouth, or a toothache) UNUSUAL RASH, SWELLING OR PAIN  UNUSUAL VAGINAL DISCHARGE OR ITCHING   Items with * indicate a potential emergency and should be followed up as soon as possible or go to the Emergency Department if any problems should occur.  Please show the CHEMOTHERAPY ALERT CARD or  IMMUNOTHERAPY ALERT CARD at check-in to the Emergency Department and triage nurse.  Should you have questions after your visit or need to cancel or reschedule your appointment, please contact [facility] [place] AT [place]  [phone] and follow the prompts.  Office hours are 8:00 a.m. to 4:30 p.m. Monday - Friday. Please note that voicemails left after 4:00 p.m. may not be returned until the following business day.  We are closed weekends and major holidays. You have access to a nurse at all times for urgent questions. Please call the main number to the clinic [phone] and follow the prompts.  For any non-urgent questions, you may also contact your provider using MyChart. We now offer e-Visits for anyone 30 and older to request care online for non-urgent symptoms. For details visit [email or web address].   Also download the MyChart app! Go to the app store, search "MyChart", open the app, select [facility], and log in with your MyChart username and password.

## 2022-04-16 NOTE — Progress Notes (Signed)
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  She smoked about 2 packs of cigarettes per day but quit smoking in 2010.  She presented to the ER withSymptoms of significant back pain MRIs lumbar spine without contrast showed pathologic fracture of the L1 vertebral body with posterior bulging of the cortex resulting in mild spinal canal stenosis.  Diffuse T1 hypointense abnormality in the L1 vertebral body with involvement of bilateral pedicles and transverse processes.  CT chest abdomen and pelvis with contrast showed 1 cm left supraclavicular adenopathy.  1 cm right subcarinal lymph node.  Necrotic 3.1 x 2.4 x 2.9 cm anterior mediastinal mass in the left prevascular region with mild mass effect on the brachiocephalic vein.  Possible phrenic nerve involvement.  Mild acute sigmoid colon diverticulitis.    Patient had biopsy of the left supraclavicular lymph node which was consistent with adenocarcinoma of lung primary.  NGS testing did not show any actionable mutations.  K-ras G12 D mutation detected.  MSI stable.  Patient underwent percutaneous fixation of the L1 vertebral body.  She completed palliative radiation treatment to her spine  Interval history-back pain presently is reasonably well-controlled with pain medications.  She continues to have difficulty swallowing and has not established follow-up with GI yet.  ECOG PS- 2 Pain scale- 3 Opioid associated  constipation- no  Review of systems- Review of Systems  Constitutional:  Positive for malaise/fatigue. Negative for chills, fever and weight loss.  HENT:  Negative for congestion, ear discharge and nosebleeds.   Eyes:  Negative for blurred vision.  Respiratory:  Negative for cough, hemoptysis, sputum production, shortness of breath and wheezing.   Cardiovascular:  Negative for chest pain, palpitations, orthopnea and claudication.  Gastrointestinal:  Negative for abdominal pain, blood in stool, constipation, diarrhea, heartburn, melena, nausea and vomiting.       Dysphagia  Genitourinary:  Negative for dysuria, flank pain, frequency, hematuria and urgency.  Musculoskeletal:  Positive for back pain. Negative for joint pain and myalgias.  Skin:  Negative for rash.  Neurological:  Negative for dizziness, tingling, focal weakness, seizures, weakness and headaches.  Endo/Heme/Allergies:  Does not bruise/bleed easily.  Psychiatric/Behavioral:  Negative for depression and suicidal ideas. The patient does not have insomnia.       No Known Allergies   Past Medical History:  Diagnosis Date   Arthritis    hands and feet   Cataract    right   Complication of anesthesia    sometimes have a hard time waking up   Diabetes mellitus    Borderline Diabetes   GERD (gastroesophageal reflux disease)    HTN (hypertension)    Hyperlipidemia    Motion sickness    Post corneal transplant    right     Past Surgical History:  Procedure Laterality Date   APPLICATION OF INTRAOPERATIVE CT SCAN  02/15/2022   Procedure: APPLICATION OF INTRAOPERATIVE CT SCAN;  Surgeon: Meade Maw, MD;  Location: [facility] ORS;  Service: Neurosurgery;;   BASAL CELL CARCINOMA EXCISION  Dr. Manley Mason   CARPAL TUNNEL RELEASE     right   COLONOSCOPY WITH PROPOFOL N/A 12/03/2015   Procedure: COLONOSCOPY WITH PROPOFOL;  Surgeon: Manya Silvas, MD;  Location: [facility] ENDOSCOPY;  Service: Endoscopy;  Laterality: N/A;    COLONOSCOPY WITH PROPOFOL N/A 02/22/2020   Procedure: COLONOSCOPY WITH PROPOFOL;  Surgeon: Lesly Rubenstein, MD;  Location: [facility] ENDOSCOPY;  Service: Endoscopy;  Laterality: N/A;   ESOPHAGOGASTRODUODENOSCOPY (EGD) WITH PROPOFOL N/A 02/22/2020   Procedure: ESOPHAGOGASTRODUODENOSCOPY (EGD) WITH PROPOFOL;  Surgeon: Lesly Rubenstein, MD;  Location: [facility] ENDOSCOPY;  Service: Endoscopy;  Laterality: N/A;   HAMMER TOE SURGERY Right 05/23/2019   Procedure: HAMMER TOE CORRECTION;  Surgeon: Samara Deist, DPM;  Location: [place];  Service: Podiatry;  Laterality: Right;  Diabetic   IR IMAGING GUIDED PORT INSERTION  04/14/2022   IR RADIOLOGIST EVAL & MGMT  03/24/2022   OSTECTOMY Right 05/23/2019   Procedure: DOUBLE OSTEOTOMY RIGHT;  Surgeon: Samara Deist, DPM;  Location: [place];  Service: Podiatry;  Laterality: Right;   SKIN CANCER EXCISION      Social History   Socioeconomic History   Marital status: Widowed    Spouse name: Not on file   Number of children: Not on file   Years of education: Not on file   Highest education level: Not on file  Occupational History    Employer: [employer]    Comment: Currently not employed  Tobacco Use   Smoking status: Former    Types: Cigarettes    Quit date: 01/13/2009    Years since quitting: 13.2   Smokeless tobacco: Never  Substance and Sexual Activity   Alcohol use: No   Drug use: No   Sexual activity: Yes    Birth control/protection: Post-menopausal  Other Topics Concern   Not on file  Social History Narrative   Not on file   Social Determinants of Health   Financial Resource Strain: Not on file  Food Insecurity: No Food Insecurity (03/31/2022)   Hunger Vital Sign    Worried About Running Out of Food in the Last Year: Never true    Ran Out of Food in the Last Year: Never true  Transportation Needs: No Transportation Needs (03/31/2022)   PRAPARE - Hydrologist (Medical): No    Lack of  Transportation (Non-Medical): No  Physical Activity: Not on file  Stress: Not on file  Social Connections: Not on file  Intimate Partner Violence: Not At Risk (02/10/2022)   Humiliation, Afraid, Rape, and Kick questionnaire    Fear of Current or Ex-Partner: No    Emotionally Abused: No    Physically Abused: No    Sexually Abused: No    Family History  Problem Relation Age of Onset   Heart disease Mother    Heart disease Father    Heart disease Maternal Grandmother    Heart disease Maternal Grandfather    Breast cancer Sister 5     Current Outpatient Medications:    acetaminophen (TYLENOL) 500 MG tablet, Take 500 mg by mouth every 6 (six) hours as needed for moderate pain, mild pain, fever or headache., Disp: , Rfl:    apixaban (ELIQUIS) 5 MG TABS tablet, Two tabs po twice a day for six days then one tab po twice a day afterwards, Disp: 72 tablet, Rfl: 0   Ascorbic Acid (VITAMIN C) 1000 MG tablet, Take 1,000 mg by mouth daily.  , Disp: , Rfl:    azithromycin (  ZITHROMAX) 250 MG tablet, One tab po daily for three days, Disp: 3 each, Rfl: 0   celecoxib (CELEBREX) 200 MG capsule, Take 1 capsule (200 mg total) by mouth 2 (two) times daily., Disp: 60 capsule, Rfl: 1   cyclobenzaprine (FLEXERIL) 5 MG tablet, Take 1 tablet (5 mg total) by mouth 3 (three) times daily as needed for muscle spasms., Disp: 30 tablet, Rfl: 0   dexamethasone (DECADRON) 4 MG tablet, Take 1 tab 2 times daily starting day before pemetrexed. Then take 2 tabs daily x 3 days starting day after carboplatin. Take with food., Disp: 30 tablet, Rfl: 1   Docusate Calcium (STOOL SOFTENER PO), Take 1 capsule by mouth daily as needed (constipation)., Disp: , Rfl:    Ferrous Sulfate (IRON) 325 (65 Fe) MG TABS, Take 1 tablet by mouth daily at 12 noon., Disp: , Rfl:    folic acid (FOLVITE) 1 MG tablet, Take 1 tablet (1 mg total) by mouth daily. Start 7 days before pemetrexed chemotherapy. Continue until 21 days after pemetrexed  completed., Disp: 100 tablet, Rfl: 3   gabapentin (NEURONTIN) 300 MG capsule, Take 1 capsule (300 mg total) by mouth 3 (three) times daily., Disp: 90 capsule, Rfl: 2   lidocaine-prilocaine (EMLA) cream, Apply 1 Application topically as needed., Disp: 30 g, Rfl: 2   LORazepam (ATIVAN) 0.5 MG tablet, Take 1 tablet (0.5 mg total) by mouth every 8 (eight) hours as needed for anxiety., Disp: 30 tablet, Rfl: 0   melatonin 5 MG TABS, Take 1 tablet (5 mg total) by mouth at bedtime as needed., Disp: 90 tablet, Rfl: 0   metoprolol tartrate (LOPRESSOR) 25 MG tablet, Take 1 tablet (25 mg total) by mouth 2 (two) times daily., Disp: 60 tablet, Rfl: 0   Multiple Vitamin (MULTIVITAMIN) capsule, Take 1 capsule by mouth daily.  , Disp: , Rfl:    nystatin (MYCOSTATIN) 100000 UNIT/ML suspension, Use as directed 5 mLs (500,000 Units total) in the mouth or throat 4 (four) times daily., Disp: 60 mL, Rfl: 0   omeprazole (PRILOSEC) 20 MG capsule, Take 1 capsule (20 mg total) by mouth daily., Disp: 90 capsule, Rfl: 3   ondansetron (ZOFRAN) 8 MG tablet, Take 1 tablet (8 mg total) by mouth every 8 (eight) hours as needed for nausea or vomiting., Disp: 20 tablet, Rfl: 2   ondansetron (ZOFRAN) 8 MG tablet, Take 1 tablet (8 mg total) by mouth every 8 (eight) hours as needed for nausea or vomiting. Start on the third day after carboplatin., Disp: 30 tablet, Rfl: 1   oxyCODONE ER (XTAMPZA ER) 18 MG C12A, Take 1 capsule by mouth every 12 (twelve) hours., Disp: 60 capsule, Rfl: 0   Oxycodone HCl 20 MG TABS, Take 0.5-1 tablets (10-20 mg total) by mouth every 4 (four) hours as needed., Disp: 60 tablet, Rfl: 0   polyethylene glycol (MIRALAX / GLYCOLAX) 17 g packet, Take 17 g by mouth daily., Disp: 14 each, Rfl: 0   prednisoLONE acetate (PRED MILD) 0.12 % ophthalmic suspension, Place 1 drop into the right eye daily., Disp: , Rfl:    prochlorperazine (COMPAZINE) 10 MG tablet, Take 1 tablet (10 mg total) by mouth every 6 (six) hours as  needed for nausea or vomiting., Disp: 30 tablet, Rfl: 2   prochlorperazine (COMPAZINE) 10 MG tablet, Take 1 tablet (10 mg total) by mouth every 6 (six) hours as needed for nausea or vomiting., Disp: 30 tablet, Rfl: 1   simvastatin (ZOCOR) 40 MG tablet, TAKE 1 TABLET EVERY  DAY  AT  6:00 PM, Disp: 90 tablet, Rfl: 3   sulfamethoxazole-trimethoprim (BACTRIM DS) 800-160 MG tablet, Take 1 tablet by mouth 2 (two) times daily., Disp: 10 tablet, Rfl: 0  Physical exam:  Vitals:   04/16/22 0855  BP: 119/64  Pulse: (!) 119  Resp: 16  Temp: (!) 97.4 F (36.3 C)  TempSrc: Tympanic  SpO2: 100%  Weight: 112 lb (50.8 kg)   Physical Exam Constitutional:      Comments: She is sitting in a wheelchair and is on home oxygen  Cardiovascular:     Rate and Rhythm: Normal rate and regular rhythm.     Heart sounds: Normal heart sounds.  Pulmonary:     Effort: Pulmonary effort is normal.     Comments: Breath sounds decreased bilaterally over bases Abdominal:     General: Bowel sounds are normal.     Palpations: Abdomen is soft.  Skin:    General: Skin is warm and dry.  Neurological:     Mental Status: She is alert and oriented to person, place, and time.         Latest Ref Rng & Units 03/31/2022    5:31 AM  CMP  Glucose 70 - 99 mg/dL 122   BUN 8 - 23 mg/dL 26   Creatinine 0.44 - 1.00 mg/dL 0.71   Sodium 135 - 145 mmol/L 134   Potassium 3.5 - 5.1 mmol/L 4.5   Chloride 98 - 111 mmol/L 101   CO2 22 - 32 mmol/L 26   Calcium 8.9 - 10.3 mg/dL 9.1   Total Protein 6.5 - 8.1 g/dL 6.3   Total Bilirubin 0.3 - 1.2 mg/dL 0.8   Alkaline Phos 38 - 126 U/L 74   AST 15 - 41 U/L 21   ALT 0 - 44 U/L 19       Latest Ref Rng & Units 04/16/2022    8:29 AM  CBC  WBC 4.0 - 10.5 K/uL 6.6   Hemoglobin 12.0 - 15.0 g/dL 10.1   Hematocrit 36.0 - 46.0 % 31.6   Platelets 150 - 400 K/uL 405     No images are attached to the encounter.  IR IMAGING GUIDED PORT INSERTION  Result Date: 04/14/2022 INDICATION: non  small cell lung cancer, need port for chemo EXAM: Chest port placement using ultrasound and fluoroscopic guidance MEDICATIONS: Documented in the EMR ANESTHESIA/SEDATION: Moderate (conscious) sedation was employed during this procedure. A total of Versed 2 mg and Fentanyl 100 mcg was administered intravenously. Moderate Sedation Time: 22 minutes. The patient's level of consciousness and vital signs were monitored continuously by radiology nursing throughout the procedure under my direct supervision. FLUOROSCOPY TIME:  Fluoroscopy Time: 0.3 minutes (1 mGy) COMPLICATIONS: None immediate. PROCEDURE: Informed written consent was obtained from the patient after a thorough discussion of the procedural risks, benefits and alternatives. All questions were addressed. Maximal Sterile Barrier Technique was utilized including caps, mask, sterile gowns, sterile gloves, sterile drape, hand hygiene and skin antiseptic. A timeout was performed prior to the initiation of the procedure. The patient was placed supine on the exam table. The right neck and chest was prepped and draped in the standard sterile fashion. A preliminary ultrasound of the right neck was performed and demonstrates a patent right internal jugular vein. A permanent ultrasound image was stored in the electronic medical record. The overlying skin was anesthetized with 1% Lidocaine. Using ultrasound guidance, access was obtained into the right internal jugular vein using a 21 gauge micropuncture set.  A wire was advanced into the SVC, a short incision was made at the puncture site, and serial dilatation performed. Next, in an ipsilateral infraclavicular location, an incision was made at the site of the subcutaneous reservoir. Blunt dissection was used to open a pocket to contain the reservoir. A subcutaneous tunnel was then created from the port site to the puncture site. A(n) 8 Fr single lumen catheter was advanced through the tunnel. The catheter was attached to the  port and this was placed in the subcutaneous pocket. Under fluoroscopic guidance, a peel away sheath was placed, and the catheter was trimmed to the appropriate length and was advanced into the central veins. The catheter length is 23 cm. The tip of the catheter lies near the superior cavoatrial junction. The port flushes and aspirates appropriately. The port was flushed and locked with heparinized saline. The port pocket was closed in 2 layers using 3-0 and 4-0 Vicryl/absorbable suture. Dermabond was also applied to both incisions. The patient tolerated the procedure well and was transferred to recovery in stable condition. IMPRESSION: Successful placement of a right sided chest port via the right internal jugular vein. The port is ready for immediate use. Electronically Signed   By: Albin Felling M.D.   On: 04/14/2022 15:37   ECHOCARDIOGRAM COMPLETE  Result Date: 04/01/2022    ECHOCARDIOGRAM REPORT   Patient Name:   DANIELLA DEWBERRY Date of Exam: 04/01/2022 Medical Rec #:  [id number]      Height:       60.0 in Accession #:    [id number]     Weight:       119.0 lb Date of Birth:  1950/09/21     BSA:          1.497 m Patient Age:    64 years       BP:           123/49 mmHg Patient Gender: F              HR:           105 bpm. Exam Location:  [facility] Procedure: 2D Echo, Color Doppler and Cardiac Doppler Indications:     I26.09 Pulmonary Embolus  History:         Patient has no prior history of Echocardiogram examinations.                  Risk Factors:Hypertension, Diabetes and Dyslipidemia.  Sonographer:     Charmayne Sheer Referring Phys:  [place] Diagnosing Phys: Ida Rogue MD IMPRESSIONS  1. Left ventricular ejection fraction, by estimation, is 50 to 55%. The left ventricle has low normal function. The left ventricle has no regional wall motion abnormalities. Left ventricular diastolic parameters are consistent with Grade II diastolic dysfunction (pseudonormalization).  2. Right ventricular  systolic function is normal. The right ventricular size is normal. Tricuspid regurgitation signal is inadequate for assessing PA pressure.  3. The mitral valve is normal in structure. Mild mitral valve regurgitation. No evidence of mitral stenosis.  4. The aortic valve is tricuspid. Aortic valve regurgitation is not visualized. Aortic valve sclerosis is present, with no evidence of aortic valve stenosis.  5. The inferior vena cava is normal in size with greater than 50% respiratory variability, suggesting right atrial pressure of 3 mmHg. FINDINGS  Left Ventricle: Left ventricular ejection fraction, by estimation, is 50 to 55%. The left ventricle has low normal function. The left ventricle has no regional wall motion abnormalities. The left  ventricular internal cavity size was normal in size. There is no left ventricular hypertrophy. Left ventricular diastolic parameters are consistent with Grade II diastolic dysfunction (pseudonormalization). Right Ventricle: The right ventricular size is normal. No increase in right ventricular wall thickness. Right ventricular systolic function is normal. Tricuspid regurgitation signal is inadequate for assessing PA pressure. Left Atrium: Left atrial size was normal in size. Right Atrium: Right atrial size was normal in size. Pericardium: There is no evidence of pericardial effusion. Mitral Valve: The mitral valve is normal in structure. Mild mitral valve regurgitation. No evidence of mitral valve stenosis. Tricuspid Valve: The tricuspid valve is normal in structure. Tricuspid valve regurgitation is mild . No evidence of tricuspid stenosis. Aortic Valve: The aortic valve is tricuspid. Aortic valve regurgitation is not visualized. Aortic valve sclerosis is present, with no evidence of aortic valve stenosis. Aortic valve mean gradient measures 3.0 mmHg. Aortic valve peak gradient measures 5.3  mmHg. Aortic valve area, by VTI measures 2.39 cm. Pulmonic Valve: The pulmonic valve was  normal in structure. Pulmonic valve regurgitation is not visualized. No evidence of pulmonic stenosis. Aorta: The aortic root is normal in size and structure. Venous: The inferior vena cava is normal in size with greater than 50% respiratory variability, suggesting right atrial pressure of 3 mmHg. IAS/Shunts: No atrial level shunt detected by color flow Doppler.  LEFT VENTRICLE PLAX 2D LVIDd:         4.20 cm   Diastology LVIDs:         3.20 cm   LV e' medial:    5.77 cm/s LV PW:         1.00 cm   LV E/e' medial:  19.9 LV IVS:        0.60 cm   LV e' lateral:   5.33 cm/s LVOT diam:     2.00 cm   LV E/e' lateral: 21.6 LV SV:         53 LV SV Index:   35 LVOT Area:     3.14 cm  RIGHT VENTRICLE RV Basal diam:  3.50 cm RV S prime:     9.90 cm/s LEFT ATRIUM           Index        RIGHT ATRIUM           Index LA diam:      2.80 cm 1.87 cm/m   RA Area:     11.90 cm LA Vol (A2C): 29.0 ml 19.37 ml/m  RA Volume:   28.60 ml  19.10 ml/m LA Vol (A4C): 37.5 ml 25.05 ml/m  AORTIC VALVE                    PULMONIC VALVE AV Area (Vmax):    2.16 cm     PV Vmax:       1.04 m/s AV Area (Vmean):   2.28 cm     PV Vmean:      72.400 cm/s AV Area (VTI):     2.39 cm     PV VTI:        0.188 m AV Vmax:           115.00 cm/s  PV Peak grad:  4.3 mmHg AV Vmean:          85.400 cm/s  PV Mean grad:  2.0 mmHg AV VTI:            0.221 m AV Peak Grad:  5.3 mmHg AV Mean Grad:      3.0 mmHg LVOT Vmax:         79.00 cm/s LVOT Vmean:        61.900 cm/s LVOT VTI:          0.168 m LVOT/AV VTI ratio: 0.76  AORTA Ao Root diam: 2.60 cm MITRAL VALVE MV Area (PHT): 6.87 cm     SHUNTS MV Decel Time: 111 msec     Systemic VTI:  0.17 m MV E velocity: 115.00 cm/s  Systemic Diam: 2.00 cm Ida Rogue MD Electronically signed by Ida Rogue MD Signature Date/Time: 04/01/2022/1:10:44 PM    Final    US Venous Img Lower Bilateral (DVT)  Result Date: 03/31/2022 CLINICAL DATA:  PEs.  Evaluate for DVT. EXAM: BILATERAL LOWER EXTREMITY VENOUS DOPPLER  ULTRASOUND TECHNIQUE: Gray-scale sonography with graded compression, as well as color Doppler and duplex ultrasound were performed to evaluate the lower extremity deep venous systems from the level of the common femoral vein and including the common femoral, femoral, profunda femoral, popliteal and calf veins including the posterior tibial, peroneal and gastrocnemius veins when visible. The superficial great saphenous vein was also interrogated. Spectral Doppler was utilized to evaluate flow at rest and with distal augmentation maneuvers in the common femoral, femoral and popliteal veins. COMPARISON:  CTA PE, concurrent. FINDINGS: RIGHT LOWER EXTREMITY VENOUS Normal compressibility of the RIGHT common femoral, superficial femoral, and popliteal veins, as well as the visualized calf veins. Visualized portions of profunda femoral vein and great saphenous vein unremarkable. No filling defects to suggest DVT on grayscale or color Doppler imaging. Doppler waveforms show normal direction of venous flow, normal respiratory plasticity and response to augmentation. OTHER No abnormal fluid collection. Heterogeneously-echogenic filling defect with incomplete coaptation within the imaged portion of the LEFT greater saphenous vein. Limitations: none LEFT LOWER EXTREMITY VENOUS Normal compressibility of the LEFT common femoral, superficial femoral, and popliteal veins, as well as the visualized calf veins. Visualized portions of profunda femoral vein and great saphenous vein unremarkable. No filling defects to suggest DVT on grayscale or color Doppler imaging. Doppler waveforms show normal direction of venous flow, normal respiratory plasticity and response to augmentation. OTHER No evidence of superficial thrombophlebitis or abnormal fluid collection. Limitations: none IMPRESSION: 1. No evidence of femoropopliteal DVT within either lower extremity. 2. Examination is POSITIVE for superficial thrombophlebitis within the LEFT  greater saphenous vein. Michaelle Birks, MD [facility] Electronically Signed   By: Michaelle Birks M.D.   On: 03/31/2022 09:34   CT Angio Chest PE W and/or Wo Contrast  Result Date: 03/31/2022 CLINICAL DATA:  Evaluate for pulmonary embolism. History of stage IV lung cancer. * Tracking Code: BO *. EXAM: CT ANGIOGRAPHY CHEST WITH CONTRAST TECHNIQUE: Multidetector CT imaging of the chest was performed using the standard protocol during bolus administration of intravenous contrast. Multiplanar CT image reconstructions and MIPs were obtained to evaluate the vascular anatomy. RADIATION DOSE REDUCTION: This exam was performed according to the departmental dose-optimization program which includes automated exposure control, adjustment of the mA and/or kV according to patient size and/or use of iterative reconstruction technique. CONTRAST:  92mL OMNIPAQUE IOHEXOL 350 MG/ML SOLN COMPARISON:  02/10/2022 FINDINGS: Cardiovascular: Exam detail is diminished due 1 to motion artifact. There is a single, small filling defect within a segmental branch of the right middle lobe pulmonary artery, image 42/7. Heart size appears normal. No pericardial effusion. Aortic atherosclerosis and coronary artery calcifications. Mediastinum/Nodes: Thyroid gland, trachea,  and esophagus are unremarkable. Index anterior mediastinal, prevascular mass measures 3.2 x 3.2 cm, image 104/6. Previously 3.1 by 2.2 cm. Subcarinal lymph node measures 1.4 cm, image 172/6. Previously 0.8 cm. Lungs/Pleura: Small left pleural effusion is new from previous exam. Progressive pleural thickening overlying the posterior left lower lobe. New heterogeneous ground-glass attenuation is scattered throughout both lungs. Progressive areas of atelectasis and architectural distortion noted within the left upper lobe and left lower lobe. Multiple new nodules are scattered throughout both lungs in worrisome for progression  of disease. Index nodules include, including: Posteromedial left upper lobe nodule measures 7 mm, image 40/5. Posterolateral right upper lobe lung nodule measures 4 mm, image 30/5. Superior segment left lower lobe lung nodule measures 4 mm, image 54/5. Right lower lobe lung nodule measures 5 mm, image 69/5. Left lower lobe lung nodule measures 8 mm, image 70/5. Upper Abdomen: No acute abnormality. Musculoskeletal: No chest wall abnormality. No acute or significant osseous findings. Review of the MIP images confirms the above findings. IMPRESSION: 1. Exam detail is diminished due to motion artifact. There is a single, small filling defect within a segmental branch of the right middle lobe pulmonary artery compatible with acute pulmonary embolus. 2. Interval increase in size of anterior mediastinal soft tissue mass. There is also been increase in size of subcarinal lymph node. 3. Multiple new nodules are scattered throughout both lungs in worrisome for pulmonary metastasis. 4. New small left pleural effusion with progressive pleural thickening overlying the posterior left lower lobe. 5. New heterogeneous ground-glass attenuation scattered throughout both lungs. Findings are nonspecific and may be inflammatory or infectious in etiology. 6. Progressive areas of atelectasis and architectural distortion noted within the left upper lobe and left lower lobe. 7. Coronary artery calcifications. 8.  Aortic Atherosclerosis (ICD10-I70.0). Critical Value/emergent results were called by telephone at the time of interpretation on 03/31/2022 at 7:40 am to provider Dr. Jori Moll, who verbally acknowledged these results. Electronically Signed   By: Kerby Moors M.D.   On: 03/31/2022 07:41   DG Chest Portable 1 View  Result Date: 03/31/2022 CLINICAL DATA:  Shortness of breath. EXAM: PORTABLE CHEST 1 VIEW COMPARISON:  Aug 18, 2019 FINDINGS: The heart size and mediastinal contours are within normal limits. Low lung volumes are noted.  Moderate severity linear atelectasis and/or infiltrate is seen within the left lung base. There is no evidence of a pleural effusion or pneumothorax. Postoperative changes are seen within the lower thoracic spine. IMPRESSION: Moderate severity left basilar linear atelectasis and/or infiltrate. Electronically Signed   By: Virgina Norfolk M.D.   On: 03/31/2022 06:00   DG Lumbar Spine Complete  Result Date: 03/30/2022 CLINICAL DATA:  Fall.  History of compression fracture EXAM: LUMBAR SPINE - COMPLETE 4+ VIEW COMPARISON:  Lumbar spine radiographs 03/08/2022. CT lumbar spine 03/16/2022 FINDINGS: Lytic lesion L1 vertebral body best seen on CT. This area is obscured by barium in the left colon on the lateral view. This is associated with L1 compression fracture. No new fracture identified. Bilateral pedicle screw and rod fusion T11 through L3. Hardware in satisfactory position. IMPRESSION: Lytic lesion L1 with pathologic fracture unchanged. Posterior fusion hardware in satisfactory position. No new fracture. Electronically Signed   By: Franchot Gallo M.D.   On: 03/30/2022 14:54   DG Thoracic Spine 2 View  Result Date: 03/30/2022 CLINICAL DATA:  Provided history: 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 site, unspecified laterality. Neoplasm related pain. EXAM: THORACIC SPINE 2 VIEWS COMPARISON:  CT of the  thoracic spine 02/10/2022. FINDINGS: Mild Dextrocurvature of the upper thoracic spine. No significant spondylolisthesis. No appreciable thoracic vertebral compression fracture. Thoracic spondylosis with multilevel disc space narrowing, degenerative endplate irregularity and degenerative endplate sclerosis. Posterior spinal fusion construct spanning the T11-L3 levels. Known pathologic L1 vertebral compression fracture better assessed on same day lumbar spine radiographs. Incidentally noted, there is residual contrast within the colon. Aortic atherosclerosis. IMPRESSION: 1. No appreciable thoracic  vertebral compression fracture. 2. Mild Dextrocurvature of the upper thoracic spine, possibly positional. 3. Thoracic spondylosis, as described. 4. Posterior spinal fusion construct spanning the T11-L3 levels. Known pathologic L1 vertebral compression fracture better assessed on the same day lumbar spine radiographs. 5. Aortic Atherosclerosis (ICD10-I70.0). Electronically Signed   By: Kellie Simmering D.O.   On: 03/30/2022 14:01   DG THORACOLUMABAR SPINE  Result Date: 03/28/2022 CLINICAL DATA:  T11-L3 fusion.  Back pain. EXAM: THORACOLUMBAR SPINE 1V COMPARISON:  Lumbar spine CT 03/16/2022 FINDINGS: There is dense contrast material throughout the entire colon which limits evaluation, particularly on the lateral view. Posterior fusion rods are seen bilaterally with transpedicular screws at T11, T12, L2 and L3, unchanged in position. There is no evidence for hardware loosening. Lucent lesions throughout the L1 vertebral body are grossly unchanged. Alignment is grossly stable with grade 1 anterolisthesis at L4-L5. IMPRESSION: 1. Stable T11-L3 posterior fusion. No evidence for hardware loosening. 2. Lucent lesions throughout the L1 vertebral body are grossly unchanged. Electronically Signed   By: Ronney Asters M.D.   On: 03/28/2022 00:21   IR Radiologist Eval & Mgmt  Result Date: 03/24/2022 EXAM: NEW PATIENT OFFICE VISIT CHIEF COMPLAINT: See below HISTORY OF PRESENT ILLNESS: See below REVIEW OF SYSTEMS: See below PHYSICAL EXAMINATION: See below ASSESSMENT AND PLAN: Please refer to completed note in the electronic medical record on [place], MD [facility] Electronically Signed   By: Michaelle Birks M.D.   On: 03/24/2022 13:32   DG ESOPHAGUS W DOUBLE CM (HD)  Result Date: 03/24/2022 CLINICAL DATA:  Dysphagia, lung cancer EXAM: ESOPHOGRAM / BARIUM SWALLOW / BARIUM TABLET STUDY TECHNIQUE: Combined double contrast and single contrast  examination performed using effervescent crystals, thick barium liquid, and thin barium liquid. The patient was observed with fluoroscopy swallowing a 13 mm barium sulphate tablet. FLUOROSCOPY: Radiation Exposure Index (as provided by the fluoroscopic device): 5 mGy COMPARISON:  None Available. FINDINGS: Normal pharyngeal anatomy and motility. Contrast flowed freely through the esophagus without evidence of a mass. Normal esophageal mucosa without evidence of irregularity or ulceration. Mild tertiary contractions of the esophagus as can be seen with mild spasm. Mild gastroesophageal reflux. Mild relative narrowing of the distal esophagus just proximal to the gastroesophageal junction concerning for a mild stricture restricting the passage of a 13 mm barium tablet. No definite hiatal hernia was demonstrated. IMPRESSION: 1. Mild relative narrowing of the distal esophagus just proximal to the gastroesophageal junction concerning for a mild stricture restricting the passage of a 13 mm barium tablet. 2. Mild tertiary contractions of the esophagus as can be seen with mild spasm. Electronically Signed   By: Kathreen Devoid M.D.   On: 03/24/2022 10:17     Assessment and plan- Patient is a 72 y.o. female with metastatic lung cancer with bone metastases here for on treatment assessment prior to cycle 1 of carbo Alimta Keytruda  Counts okay to proceed with cycle 1 of carbo Alimta Keytruda chemotherapy today.  I will see her back in 3 weeks for  cycle 2.  She will see NP Altha Harm in 1 week with port labs for possible IV fluids.  Discussed risks and benefits of treatment including all but not limited to nausea, vomiting, low blood counts, risk of infections and hospitalizations.  Risk of autoimmune side effects associated with immunotherapy.  Patient understands and agrees to proceed as planned  Patient has evidence of microcytic anemia and her iron studies from November 2023 was suggestive of iron deficiency.  We will  proceed with Venofer x 5 doses starting today.  Discussed risks and benefits of IV iron including all but not limited to possible risk of infusion reactions.  Patient understands agrees to proceed as planned.  Back pain: Currently patient is waiting to hear from interventional radiology.  She is s/p palliative radiation and is also taking narcotic pain medications.  Dysphagia: GI try to reach her but has not been able to get hold of her.  We will give her information for GI department for her to call for EGD   Visit Diagnosis 1. Loss of weight   2. Primary malignant neoplasm of right lung metastatic to other site [facility])   3. Other iron deficiency anemia   4. Encounter for antineoplastic chemotherapy   5. Encounter for antineoplastic immunotherapy   6. Dysphagia, unspecified type      Dr. Randa Evens, MD, MPH [facility] at [facility] [phone] 04/16/2022 8:48 AM

## 2022-04-16 NOTE — Progress Notes (Signed)
Met with patient after follow up visit with Dr. Janese Banks to start chemotherapy. All questions answered during visit. Pt informed that working on her appt with IR to address her back pain, referral to GI for esophageal stricture, and ordering portable oxygen concentrator through [employer]. Pt did not have any further needs at this time. Instructed to call with any questions or needs. Will follow up at next visit.

## 2022-04-16 NOTE — Addendum Note (Signed)
Addended by: Luella Cook on: 04/16/2022 10:38 AM   Modules accepted: Orders

## 2022-04-16 NOTE — Progress Notes (Signed)
Pt in for follow up and treatment today.  

## 2022-04-16 NOTE — Progress Notes (Signed)
Patient tolerated initial CARBOplatin,  PEMEtrexed & pembrolizumab infusion well today. Patient also received Vit B12 injection and Venofer. Post treatment educated complete. No questions/concerns voiced. Patient stable at discharge. AVS given.

## 2022-04-17 LAB — T4: T4, Total: 8.3 ug/dL (ref 4.5–12.0)

## 2022-04-19 ENCOUNTER — Telehealth: Payer: Self-pay

## 2022-04-19 NOTE — Telephone Encounter (Signed)
Telephone call for follow up after first chemo on Friday.  Spoke with daughter and she states infusion went well but vomited that night.  Did not drink much fluid Saturday and drank good yesterday.   Encouraged patient daughter to call for any questions or concerns.

## 2022-04-21 ENCOUNTER — Other Ambulatory Visit: Payer: Self-pay | Admitting: Hospice and Palliative Medicine

## 2022-04-21 ENCOUNTER — Inpatient Hospital Stay: Payer: Medicare HMO | Admitting: Occupational Therapy

## 2022-04-21 ENCOUNTER — Other Ambulatory Visit: Payer: Self-pay | Admitting: *Deleted

## 2022-04-21 DIAGNOSIS — M4856XA Collapsed vertebra, not elsewhere classified, lumbar region, initial encounter for fracture: Secondary | ICD-10-CM

## 2022-04-21 DIAGNOSIS — E860 Dehydration: Secondary | ICD-10-CM

## 2022-04-21 DIAGNOSIS — C3491 Malignant neoplasm of unspecified part of right bronchus or lung: Secondary | ICD-10-CM

## 2022-04-22 ENCOUNTER — Encounter: Payer: Self-pay | Admitting: Hospice and Palliative Medicine

## 2022-04-22 ENCOUNTER — Inpatient Hospital Stay: Payer: Medicare HMO

## 2022-04-22 ENCOUNTER — Telehealth: Payer: Self-pay | Admitting: *Deleted

## 2022-04-22 ENCOUNTER — Other Ambulatory Visit: Payer: Self-pay

## 2022-04-22 ENCOUNTER — Inpatient Hospital Stay: Payer: Medicare HMO | Attending: Oncology

## 2022-04-22 ENCOUNTER — Inpatient Hospital Stay (HOSPITAL_BASED_OUTPATIENT_CLINIC_OR_DEPARTMENT_OTHER): Payer: Medicare HMO | Admitting: Hospice and Palliative Medicine

## 2022-04-22 ENCOUNTER — Encounter: Payer: Self-pay | Admitting: *Deleted

## 2022-04-22 VITALS — BP 98/60 | HR 113 | Temp 95.7°F | Resp 18

## 2022-04-22 DIAGNOSIS — I10 Essential (primary) hypertension: Secondary | ICD-10-CM | POA: Insufficient documentation

## 2022-04-22 DIAGNOSIS — C3490 Malignant neoplasm of unspecified part of unspecified bronchus or lung: Secondary | ICD-10-CM | POA: Insufficient documentation

## 2022-04-22 DIAGNOSIS — C3491 Malignant neoplasm of unspecified part of right bronchus or lung: Secondary | ICD-10-CM

## 2022-04-22 DIAGNOSIS — M4856XA Collapsed vertebra, not elsewhere classified, lumbar region, initial encounter for fracture: Secondary | ICD-10-CM | POA: Insufficient documentation

## 2022-04-22 DIAGNOSIS — Z85828 Personal history of other malignant neoplasm of skin: Secondary | ICD-10-CM | POA: Diagnosis not present

## 2022-04-22 DIAGNOSIS — Z803 Family history of malignant neoplasm of breast: Secondary | ICD-10-CM | POA: Insufficient documentation

## 2022-04-22 DIAGNOSIS — C7951 Secondary malignant neoplasm of bone: Secondary | ICD-10-CM | POA: Insufficient documentation

## 2022-04-22 DIAGNOSIS — Z5111 Encounter for antineoplastic chemotherapy: Secondary | ICD-10-CM | POA: Diagnosis present

## 2022-04-22 DIAGNOSIS — Z5112 Encounter for antineoplastic immunotherapy: Secondary | ICD-10-CM | POA: Diagnosis not present

## 2022-04-22 DIAGNOSIS — R1310 Dysphagia, unspecified: Secondary | ICD-10-CM | POA: Diagnosis not present

## 2022-04-22 DIAGNOSIS — Z87891 Personal history of nicotine dependence: Secondary | ICD-10-CM | POA: Diagnosis not present

## 2022-04-22 DIAGNOSIS — D509 Iron deficiency anemia, unspecified: Secondary | ICD-10-CM | POA: Diagnosis not present

## 2022-04-22 DIAGNOSIS — E860 Dehydration: Secondary | ICD-10-CM | POA: Insufficient documentation

## 2022-04-22 DIAGNOSIS — R000 Tachycardia, unspecified: Secondary | ICD-10-CM | POA: Diagnosis not present

## 2022-04-22 DIAGNOSIS — G893 Neoplasm related pain (acute) (chronic): Secondary | ICD-10-CM | POA: Diagnosis not present

## 2022-04-22 DIAGNOSIS — E1136 Type 2 diabetes mellitus with diabetic cataract: Secondary | ICD-10-CM | POA: Insufficient documentation

## 2022-04-22 DIAGNOSIS — D508 Other iron deficiency anemias: Secondary | ICD-10-CM

## 2022-04-22 LAB — CBC WITH DIFFERENTIAL/PLATELET
Abs Immature Granulocytes: 0.02 10*3/uL (ref 0.00–0.07)
Basophils Absolute: 0 10*3/uL (ref 0.0–0.1)
Basophils Relative: 0 %
Eosinophils Absolute: 0.1 10*3/uL (ref 0.0–0.5)
Eosinophils Relative: 2 %
HCT: 31.7 % — ABNORMAL LOW (ref 36.0–46.0)
Hemoglobin: 10.3 g/dL — ABNORMAL LOW (ref 12.0–15.0)
Immature Granulocytes: 0 %
Lymphocytes Relative: 10 %
Lymphs Abs: 0.5 10*3/uL — ABNORMAL LOW (ref 0.7–4.0)
MCH: 25.6 pg — ABNORMAL LOW (ref 26.0–34.0)
MCHC: 32.5 g/dL (ref 30.0–36.0)
MCV: 78.7 fL — ABNORMAL LOW (ref 80.0–100.0)
Monocytes Absolute: 0.1 10*3/uL (ref 0.1–1.0)
Monocytes Relative: 2 %
Neutro Abs: 3.8 10*3/uL (ref 1.7–7.7)
Neutrophils Relative %: 86 %
Platelets: 243 10*3/uL (ref 150–400)
RBC: 4.03 MIL/uL (ref 3.87–5.11)
RDW: 16.3 % — ABNORMAL HIGH (ref 11.5–15.5)
WBC: 4.5 10*3/uL (ref 4.0–10.5)
nRBC: 0 % (ref 0.0–0.2)

## 2022-04-22 LAB — BASIC METABOLIC PANEL
Anion gap: 10 (ref 5–15)
BUN: 18 mg/dL (ref 8–23)
CO2: 25 mmol/L (ref 22–32)
Calcium: 9.3 mg/dL (ref 8.9–10.3)
Chloride: 93 mmol/L — ABNORMAL LOW (ref 98–111)
Creatinine, Ser: 0.59 mg/dL (ref 0.44–1.00)
GFR, Estimated: >60 mL/min (ref >60)
Glucose, Bld: 160 mg/dL — ABNORMAL HIGH (ref 70–99)
Potassium: 4.5 mmol/L (ref 3.5–5.1)
Sodium: 128 mmol/L — ABNORMAL LOW (ref 135–145)

## 2022-04-22 MED ORDER — SODIUM CHLORIDE 0.9 % IV SOLN
200.0000 mg | INTRAVENOUS | Status: DC
  Administered 2022-04-22: 200 mg via INTRAVENOUS
  Filled 2022-04-22: qty 200

## 2022-04-22 MED ORDER — SODIUM CHLORIDE 0.9 % IV SOLN
Freq: Once | INTRAVENOUS | Status: AC
  Filled 2022-04-22: qty 250

## 2022-04-22 MED ORDER — HEPARIN SOD (PORK) LOCK FLUSH 100 UNIT/ML IV SOLN
500.0000 [IU] | Freq: Once | INTRAVENOUS | Status: DC | PRN
  Filled 2022-04-22: qty 5

## 2022-04-22 MED ORDER — SODIUM CHLORIDE 0.9 % IV SOLN
INTRAVENOUS | Status: DC
  Filled 2022-04-22 (×2): qty 250

## 2022-04-22 NOTE — Progress Notes (Signed)
Symptom Management [place] at [facility] Telephone:(336) [phone] Fax:(336) [phone]  Patient Care Team: Lynnell Jude, MD as PCP - General (Family Medicine) Telford Nab, RN as Oncology Nurse Navigator   NAME OF PATIENT: Amber Glenn  [id number]  13-Sep-1950   DATE OF VISIT: 04/22/22  REASON FOR CONSULT: Amber Glenn is a 72 y.o. female with multiple medical problems including stage IV lung cancer with bone metastases and pathologic compression fracture of L1 status post percutaneous fixation by neurosurgery followed by RFA due to intractable pain.     INTERVAL HISTORY: Patient saw Dr. Janese Banks on 04/16/2022 and proceeded with cycle [place].  She returns today for port labs and possible IV fluids.  Patient reports she is doing about the same.  Pain is better controlled but patient still having poor oral intake with physical difficulty swallowing feeling like food gets stuck in her esophagus.  She has follow-up pending next week to see GI.  Denies any neurologic complaints. Denies recent fevers or illnesses. Denies any easy bleeding or bruising. Reports poor appetite. Denies chest pain. Denies any nausea, vomiting, constipation, or diarrhea. Denies urinary complaints. Patient offers no further specific complaints today.   PAST MEDICAL HISTORY: Past Medical History:  Diagnosis Date   Arthritis    hands and feet   Cataract    right   Complication of anesthesia    sometimes have a hard time waking up   Diabetes mellitus    Borderline Diabetes   GERD (gastroesophageal reflux disease)    HTN (hypertension)    Hyperlipidemia    Motion sickness    Post corneal transplant    right    PAST SURGICAL HISTORY:  Past Surgical History:  Procedure Laterality Date   APPLICATION OF INTRAOPERATIVE CT SCAN  02/15/2022   Procedure: APPLICATION OF INTRAOPERATIVE CT SCAN;  Surgeon: Meade Maw, MD;  Location: [facility] ORS;  Service:  Neurosurgery;;   BASAL CELL CARCINOMA EXCISION     Dr. Manley Mason   CARPAL TUNNEL RELEASE     right   COLONOSCOPY WITH PROPOFOL N/A 12/03/2015   Procedure: COLONOSCOPY WITH PROPOFOL;  Surgeon: Manya Silvas, MD;  Location: [facility] ENDOSCOPY;  Service: Endoscopy;  Laterality: N/A;   COLONOSCOPY WITH PROPOFOL N/A 02/22/2020   Procedure: COLONOSCOPY WITH PROPOFOL;  Surgeon: Lesly Rubenstein, MD;  Location: [facility] ENDOSCOPY;  Service: Endoscopy;  Laterality: N/A;   ESOPHAGOGASTRODUODENOSCOPY (EGD) WITH PROPOFOL N/A 02/22/2020   Procedure: ESOPHAGOGASTRODUODENOSCOPY (EGD) WITH PROPOFOL;  Surgeon: Lesly Rubenstein, MD;  Location: [facility] ENDOSCOPY;  Service: Endoscopy;  Laterality: N/A;   HAMMER TOE SURGERY Right 05/23/2019   Procedure: HAMMER TOE CORRECTION;  Surgeon: Samara Deist, DPM;  Location: [place];  Service: Podiatry;  Laterality: Right;  Diabetic   IR IMAGING GUIDED PORT INSERTION  04/14/2022   IR RADIOLOGIST EVAL & MGMT  03/24/2022   OSTECTOMY Right 05/23/2019   Procedure: DOUBLE OSTEOTOMY RIGHT;  Surgeon: Samara Deist, DPM;  Location: [place];  Service: Podiatry;  Laterality: Right;   SKIN CANCER EXCISION      HEMATOLOGY/ONCOLOGY HISTORY:  Oncology History  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 site [facility])  03/03/2022 Initial Diagnosis   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 site [facility])   03/03/2022 Cancer Staging   Staging form: Lung, AJCC 8th Edition - Clinical stage from 03/03/2022: Stage IV (cT2, cN3, pM1) - Signed by Sindy Guadeloupe, MD on 03/03/2022   04/16/2022 -  Chemotherapy   Patient is on Treatment Plan : LUNG Carboplatin (5) + Pemetrexed (500) + Pembrolizumab (200) D1 q21d Induction x 4 cycles / Maintenance Pemetrexed (500) + Pembrolizumab (200) D1 q21d       ALLERGIES:  has No Known Allergies.  MEDICATIONS:  Current Outpatient Medications  Medication Sig Dispense Refill   acetaminophen (TYLENOL) 500 MG tablet  Take 500 mg by mouth every 6 (six) hours as needed for moderate pain, mild pain, fever or headache.     apixaban (ELIQUIS) 5 MG TABS tablet Two tabs po twice a day for six days then one tab po twice a day afterwards 72 tablet 0   Ascorbic Acid (VITAMIN C) 1000 MG tablet Take 1,000 mg by mouth daily.       celecoxib (CELEBREX) 200 MG capsule Take 1 capsule (200 mg total) by mouth 2 (two) times daily. 60 capsule 1   cyclobenzaprine (FLEXERIL) 5 MG tablet Take 1 tablet (5 mg total) by mouth 3 (three) times daily as needed for muscle spasms. 30 tablet 0   Docusate Calcium (STOOL SOFTENER PO) Take 1 capsule by mouth daily as needed (constipation).     Ferrous Sulfate (IRON) 325 (65 Fe) MG TABS Take 1 tablet by mouth daily at 12 noon.     folic acid (FOLVITE) 1 MG tablet Take 1 tablet (1 mg total) by mouth daily. Start 7 days before pemetrexed chemotherapy. Continue until 21 days after pemetrexed completed. 100 tablet 3   gabapentin (NEURONTIN) 300 MG capsule Take 1 capsule (300 mg total) by mouth 3 (three) times daily. 90 capsule 2   lidocaine-prilocaine (EMLA) cream Apply 1 Application topically as needed. 30 g 2   LORazepam (ATIVAN) 0.5 MG tablet Take 1 tablet (0.5 mg total) by mouth every 8 (eight) hours as needed for anxiety. 30 tablet 0   melatonin 5 MG TABS Take 1 tablet (5 mg total) by mouth at bedtime as needed. 90 tablet 0   metoprolol tartrate (LOPRESSOR) 25 MG tablet Take 1 tablet (25 mg total) by mouth 2 (two) times daily. 60 tablet 0   Multiple Vitamin (MULTIVITAMIN) capsule Take 1 capsule by mouth daily.       omeprazole (PRILOSEC) 20 MG capsule Take 1 capsule (20 mg total) by mouth daily. 90 capsule 3   ondansetron (ZOFRAN) 8 MG tablet Take 1 tablet (8 mg total) by mouth every 8 (eight) hours as needed for nausea or vomiting. Start on the third day after carboplatin. 30 tablet 1   oxyCODONE ER (XTAMPZA ER) 18 MG C12A Take 1 capsule by mouth every 12 (twelve) hours. 60 capsule 0   Oxycodone  HCl 20 MG TABS Take 0.5-1 tablets (10-20 mg total) by mouth every 4 (four) hours as needed. 60 tablet 0   polyethylene glycol (MIRALAX / GLYCOLAX) 17 g packet Take 17 g by mouth daily. 14 each 0   prednisoLONE acetate (PRED MILD) 0.12 % ophthalmic suspension Place 1 drop into the right eye daily.     prochlorperazine (COMPAZINE) 10 MG tablet Take 1 tablet (10 mg total) by mouth every 6 (six) hours as needed for nausea or vomiting. 30 tablet 2   simvastatin (ZOCOR) 40 MG tablet TAKE 1 TABLET EVERY DAY  AT  6:00 PM 90 tablet 3   dexamethasone (DECADRON) 4 MG tablet Take 1 tab 2 times daily starting day before pemetrexed. Then take 2 tabs daily x 3 days starting day after carboplatin. Take with food. (Patient not taking: Reported on 04/22/2022)  30 tablet 1   No current facility-administered medications for this visit.    VITAL SIGNS: BP 98/60   Pulse (!) 113   Temp (!) 95.7 F (35.4 C) (Tympanic)   Resp 18   SpO2 98%  There were no vitals filed for this visit.  Estimated body mass index is 21.87 kg/m as calculated from the following:   Height as of 04/14/22: 5' (1.524 m).   Weight as of 04/16/22: 112 lb (50.8 kg).  LABS: CBC:    Component Value Date/Time   WBC 6.6 04/16/2022 0829   HGB 10.1 (L) 04/16/2022 0829   HCT 31.6 (L) 04/16/2022 0829   PLT 405 (H) 04/16/2022 0829   MCV 78.2 (L) 04/16/2022 0829   NEUTROABS 5.0 04/16/2022 0829   LYMPHSABS 0.9 04/16/2022 0829   MONOABS 0.5 04/16/2022 0829   EOSABS 0.0 04/16/2022 0829   BASOSABS 0.0 04/16/2022 0829   Comprehensive Metabolic Panel:    Component Value Date/Time   NA 128 (L) 04/16/2022 0829   K 4.6 04/16/2022 0829   CL 91 (L) 04/16/2022 0829   CO2 31 04/16/2022 0829   BUN 24 (H) 04/16/2022 0829   CREATININE 0.66 04/16/2022 0829   GLUCOSE 138 (H) 04/16/2022 0829   CALCIUM 9.7 04/16/2022 0829   AST 21 04/16/2022 0829   ALT 16 04/16/2022 0829   ALKPHOS 62 04/16/2022 0829   BILITOT 0.2 (L) 04/16/2022 0829   PROT 7.2  04/16/2022 0829   ALBUMIN 3.5 04/16/2022 0829    RADIOGRAPHIC STUDIES: IR IMAGING GUIDED PORT INSERTION  Result Date: 04/14/2022 INDICATION: non small cell lung cancer, need port for chemo EXAM: Chest port placement using ultrasound and fluoroscopic guidance MEDICATIONS: Documented in the EMR ANESTHESIA/SEDATION: Moderate (conscious) sedation was employed during this procedure. A total of Versed 2 mg and Fentanyl 100 mcg was administered intravenously. Moderate Sedation Time: 22 minutes. The patient's level of consciousness and vital signs were monitored continuously by radiology nursing throughout the procedure under my direct supervision. FLUOROSCOPY TIME:  Fluoroscopy Time: 0.3 minutes (1 mGy) COMPLICATIONS: None immediate. PROCEDURE: Informed written consent was obtained from the patient after a thorough discussion of the procedural risks, benefits and alternatives. All questions were addressed. Maximal Sterile Barrier Technique was utilized including caps, mask, sterile gowns, sterile gloves, sterile drape, hand hygiene and skin antiseptic. A timeout was performed prior to the initiation of the procedure. The patient was placed supine on the exam table. The right neck and chest was prepped and draped in the standard sterile fashion. A preliminary ultrasound of the right neck was performed and demonstrates a patent right internal jugular vein. A permanent ultrasound image was stored in the electronic medical record. The overlying skin was anesthetized with 1% Lidocaine. Using ultrasound guidance, access was obtained into the right internal jugular vein using a 21 gauge micropuncture set. A wire was advanced into the SVC, a short incision was made at the puncture site, and serial dilatation performed. Next, in an ipsilateral infraclavicular location, an incision was made at the site of the subcutaneous reservoir. Blunt dissection was used to open a pocket to contain the reservoir. A subcutaneous tunnel was  then created from the port site to the puncture site. A(n) 8 Fr single lumen catheter was advanced through the tunnel. The catheter was attached to the port and this was placed in the subcutaneous pocket. Under fluoroscopic guidance, a peel away sheath was placed, and the catheter was trimmed to the appropriate length and was advanced into the central  veins. The catheter length is 23 cm. The tip of the catheter lies near the superior cavoatrial junction. The port flushes and aspirates appropriately. The port was flushed and locked with heparinized saline. The port pocket was closed in 2 layers using 3-0 and 4-0 Vicryl/absorbable suture. Dermabond was also applied to both incisions. The patient tolerated the procedure well and was transferred to recovery in stable condition. IMPRESSION: Successful placement of a right sided chest port via the right internal jugular vein. The port is ready for immediate use. Electronically Signed   By: Albin Felling M.D.   On: 04/14/2022 15:37   ECHOCARDIOGRAM COMPLETE  Result Date: 04/01/2022    ECHOCARDIOGRAM REPORT   Patient Name:   DONOVAN GATCHEL Date of Exam: 04/01/2022 Medical Rec #:  [id number]      Height:       60.0 in Accession #:    [id number]     Weight:       119.0 lb Date of Birth:  1950-05-26     BSA:          1.497 m Patient Age:    89 years       BP:           123/49 mmHg Patient Gender: F              HR:           105 bpm. Exam Location:  [facility] Procedure: 2D Echo, Color Doppler and Cardiac Doppler Indications:     I26.09 Pulmonary Embolus  History:         Patient has no prior history of Echocardiogram examinations.                  Risk Factors:Hypertension, Diabetes and Dyslipidemia.  Sonographer:     Charmayne Sheer Referring Phys:  [place] Diagnosing Phys: Ida Rogue MD IMPRESSIONS  1. Left ventricular ejection fraction, by estimation, is 50 to 55%. The left ventricle has low normal function. The left ventricle has no regional wall motion  abnormalities. Left ventricular diastolic parameters are consistent with Grade II diastolic dysfunction (pseudonormalization).  2. Right ventricular systolic function is normal. The right ventricular size is normal. Tricuspid regurgitation signal is inadequate for assessing PA pressure.  3. The mitral valve is normal in structure. Mild mitral valve regurgitation. No evidence of mitral stenosis.  4. The aortic valve is tricuspid. Aortic valve regurgitation is not visualized. Aortic valve sclerosis is present, with no evidence of aortic valve stenosis.  5. The inferior vena cava is normal in size with greater than 50% respiratory variability, suggesting right atrial pressure of 3 mmHg. FINDINGS  Left Ventricle: Left ventricular ejection fraction, by estimation, is 50 to 55%. The left ventricle has low normal function. The left ventricle has no regional wall motion abnormalities. The left ventricular internal cavity size was normal in size. There is no left ventricular hypertrophy. Left ventricular diastolic parameters are consistent with Grade II diastolic dysfunction (pseudonormalization). Right Ventricle: The right ventricular size is normal. No increase in right ventricular wall thickness. Right ventricular systolic function is normal. Tricuspid regurgitation signal is inadequate for assessing PA pressure. Left Atrium: Left atrial size was normal in size. Right Atrium: Right atrial size was normal in size. Pericardium: There is no evidence of pericardial effusion. Mitral Valve: The mitral valve is normal in structure. Mild mitral valve regurgitation. No evidence of mitral valve stenosis. Tricuspid Valve: The tricuspid valve is normal in structure. Tricuspid valve regurgitation is  mild . No evidence of tricuspid stenosis. Aortic Valve: The aortic valve is tricuspid. Aortic valve regurgitation is not visualized. Aortic valve sclerosis is present, with no evidence of aortic valve stenosis. Aortic valve mean gradient  measures 3.0 mmHg. Aortic valve peak gradient measures 5.3  mmHg. Aortic valve area, by VTI measures 2.39 cm. Pulmonic Valve: The pulmonic valve was normal in structure. Pulmonic valve regurgitation is not visualized. No evidence of pulmonic stenosis. Aorta: The aortic root is normal in size and structure. Venous: The inferior vena cava is normal in size with greater than 50% respiratory variability, suggesting right atrial pressure of 3 mmHg. IAS/Shunts: No atrial level shunt detected by color flow Doppler.  LEFT VENTRICLE PLAX 2D LVIDd:         4.20 cm   Diastology LVIDs:         3.20 cm   LV e' medial:    5.77 cm/s LV PW:         1.00 cm   LV E/e' medial:  19.9 LV IVS:        0.60 cm   LV e' lateral:   5.33 cm/s LVOT diam:     2.00 cm   LV E/e' lateral: 21.6 LV SV:         53 LV SV Index:   35 LVOT Area:     3.14 cm  RIGHT VENTRICLE RV Basal diam:  3.50 cm RV S prime:     9.90 cm/s LEFT ATRIUM           Index        RIGHT ATRIUM           Index LA diam:      2.80 cm 1.87 cm/m   RA Area:     11.90 cm LA Vol (A2C): 29.0 ml 19.37 ml/m  RA Volume:   28.60 ml  19.10 ml/m LA Vol (A4C): 37.5 ml 25.05 ml/m  AORTIC VALVE                    PULMONIC VALVE AV Area (Vmax):    2.16 cm     PV Vmax:       1.04 m/s AV Area (Vmean):   2.28 cm     PV Vmean:      72.400 cm/s AV Area (VTI):     2.39 cm     PV VTI:        0.188 m AV Vmax:           115.00 cm/s  PV Peak grad:  4.3 mmHg AV Vmean:          85.400 cm/s  PV Mean grad:  2.0 mmHg AV VTI:            0.221 m AV Peak Grad:      5.3 mmHg AV Mean Grad:      3.0 mmHg LVOT Vmax:         79.00 cm/s LVOT Vmean:        61.900 cm/s LVOT VTI:          0.168 m LVOT/AV VTI ratio: 0.76  AORTA Ao Root diam: 2.60 cm MITRAL VALVE MV Area (PHT): 6.87 cm     SHUNTS MV Decel Time: 111 msec     Systemic VTI:  0.17 m MV E velocity: 115.00 cm/s  Systemic Diam: 2.00 cm Ida Rogue MD Electronically signed by Ida Rogue MD Signature Date/Time: 04/01/2022/1:10:44 PM    Final  US  Venous Img Lower Bilateral (DVT)  Result Date: 03/31/2022 CLINICAL DATA:  PEs.  Evaluate for DVT. EXAM: BILATERAL LOWER EXTREMITY VENOUS DOPPLER ULTRASOUND TECHNIQUE: Gray-scale sonography with graded compression, as well as color Doppler and duplex ultrasound were performed to evaluate the lower extremity deep venous systems from the level of the common femoral vein and including the common femoral, femoral, profunda femoral, popliteal and calf veins including the posterior tibial, peroneal and gastrocnemius veins when visible. The superficial great saphenous vein was also interrogated. Spectral Doppler was utilized to evaluate flow at rest and with distal augmentation maneuvers in the common femoral, femoral and popliteal veins. COMPARISON:  CTA PE, concurrent. FINDINGS: RIGHT LOWER EXTREMITY VENOUS Normal compressibility of the RIGHT common femoral, superficial femoral, and popliteal veins, as well as the visualized calf veins. Visualized portions of profunda femoral vein and great saphenous vein unremarkable. No filling defects to suggest DVT on grayscale or color Doppler imaging. Doppler waveforms show normal direction of venous flow, normal respiratory plasticity and response to augmentation. OTHER No abnormal fluid collection. Heterogeneously-echogenic filling defect with incomplete coaptation within the imaged portion of the LEFT greater saphenous vein. Limitations: none LEFT LOWER EXTREMITY VENOUS Normal compressibility of the LEFT common femoral, superficial femoral, and popliteal veins, as well as the visualized calf veins. Visualized portions of profunda femoral vein and great saphenous vein unremarkable. No filling defects to suggest DVT on grayscale or color Doppler imaging. Doppler waveforms show normal direction of venous flow, normal respiratory plasticity and response to augmentation. OTHER No evidence of superficial thrombophlebitis or abnormal fluid collection. Limitations: none IMPRESSION: 1.  No evidence of femoropopliteal DVT within either lower extremity. 2. Examination is POSITIVE for superficial thrombophlebitis within the LEFT greater saphenous vein. Michaelle Birks, MD [facility]iology Electronically Signed   By: Michaelle Birks M.D.   On: 03/31/2022 09:34   CT Angio Chest PE W and/or Wo Contrast  Result Date: 03/31/2022 CLINICAL DATA:  Evaluate for pulmonary embolism. History of stage IV lung cancer. * Tracking Code: BO *. EXAM: CT ANGIOGRAPHY CHEST WITH CONTRAST TECHNIQUE: Multidetector CT imaging of the chest was performed using the standard protocol during bolus administration of intravenous contrast. Multiplanar CT image reconstructions and MIPs were obtained to evaluate the vascular anatomy. RADIATION DOSE REDUCTION: This exam was performed according to the departmental dose-optimization program which includes automated exposure control, adjustment of the mA and/or kV according to patient size and/or use of iterative reconstruction technique. CONTRAST:  66mL OMNIPAQUE IOHEXOL 350 MG/ML SOLN COMPARISON:  02/10/2022 FINDINGS: Cardiovascular: Exam detail is diminished due 1 to motion artifact. There is a single, small filling defect within a segmental branch of the right middle lobe pulmonary artery, image 42/7. Heart size appears normal. No pericardial effusion. Aortic atherosclerosis and coronary artery calcifications. Mediastinum/Nodes: Thyroid gland, trachea, and esophagus are unremarkable. Index anterior mediastinal, prevascular mass measures 3.2 x 3.2 cm, image 104/6. Previously 3.1 by 2.2 cm. Subcarinal lymph node measures 1.4 cm, image 172/6. Previously 0.8 cm. Lungs/Pleura: Small left pleural effusion is new from previous exam. Progressive pleural thickening overlying the posterior left lower lobe. New heterogeneous ground-glass attenuation is scattered throughout both lungs. Progressive areas of atelectasis and architectural  distortion noted within the left upper lobe and left lower lobe. Multiple new nodules are scattered throughout both lungs in worrisome for progression of disease. Index nodules include, including: Posteromedial left upper lobe nodule measures 7 mm, image 40/5. Posterolateral right upper lobe lung nodule measures 4 mm,  image 30/5. Superior segment left lower lobe lung nodule measures 4 mm, image 54/5. Right lower lobe lung nodule measures 5 mm, image 69/5. Left lower lobe lung nodule measures 8 mm, image 70/5. Upper Abdomen: No acute abnormality. Musculoskeletal: No chest wall abnormality. No acute or significant osseous findings. Review of the MIP images confirms the above findings. IMPRESSION: 1. Exam detail is diminished due to motion artifact. There is a single, small filling defect within a segmental branch of the right middle lobe pulmonary artery compatible with acute pulmonary embolus. 2. Interval increase in size of anterior mediastinal soft tissue mass. There is also been increase in size of subcarinal lymph node. 3. Multiple new nodules are scattered throughout both lungs in worrisome for pulmonary metastasis. 4. New small left pleural effusion with progressive pleural thickening overlying the posterior left lower lobe. 5. New heterogeneous ground-glass attenuation scattered throughout both lungs. Findings are nonspecific and may be inflammatory or infectious in etiology. 6. Progressive areas of atelectasis and architectural distortion noted within the left upper lobe and left lower lobe. 7. Coronary artery calcifications. 8.  Aortic Atherosclerosis (ICD10-I70.0). Critical Value/emergent results were called by telephone at the time of interpretation on 03/31/2022 at 7:40 am to provider Dr. Jori Moll, who verbally acknowledged these results. Electronically Signed   By: Kerby Moors M.D.   On: 03/31/2022 07:41   DG Chest Portable 1 View  Result Date: 03/31/2022 CLINICAL DATA:  Shortness of breath. EXAM:  PORTABLE CHEST 1 VIEW COMPARISON:  Aug 18, 2019 FINDINGS: The heart size and mediastinal contours are within normal limits. Low lung volumes are noted. Moderate severity linear atelectasis and/or infiltrate is seen within the left lung base. There is no evidence of a pleural effusion or pneumothorax. Postoperative changes are seen within the lower thoracic spine. IMPRESSION: Moderate severity left basilar linear atelectasis and/or infiltrate. Electronically Signed   By: Virgina Norfolk M.D.   On: 03/31/2022 06:00   DG Lumbar Spine Complete  Result Date: 03/30/2022 CLINICAL DATA:  Fall.  History of compression fracture EXAM: LUMBAR SPINE - COMPLETE 4+ VIEW COMPARISON:  Lumbar spine radiographs 03/08/2022. CT lumbar spine 03/16/2022 FINDINGS: Lytic lesion L1 vertebral body best seen on CT. This area is obscured by barium in the left colon on the lateral view. This is associated with L1 compression fracture. No new fracture identified. Bilateral pedicle screw and rod fusion T11 through L3. Hardware in satisfactory position. IMPRESSION: Lytic lesion L1 with pathologic fracture unchanged. Posterior fusion hardware in satisfactory position. No new fracture. Electronically Signed   By: Franchot Gallo M.D.   On: 03/30/2022 14:54   DG Thoracic Spine 2 View  Result Date: 03/30/2022 CLINICAL DATA:  Provided history: 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 site, unspecified laterality. Neoplasm related pain. EXAM: THORACIC SPINE 2 VIEWS COMPARISON:  CT of the thoracic spine 02/10/2022. FINDINGS: Mild Dextrocurvature of the upper thoracic spine. No significant spondylolisthesis. No appreciable thoracic vertebral compression fracture. Thoracic spondylosis with multilevel disc space narrowing, degenerative endplate irregularity and degenerative endplate sclerosis. Posterior spinal fusion construct spanning the T11-L3 levels. Known pathologic L1 vertebral compression fracture better assessed on same day  lumbar spine radiographs. Incidentally noted, there is residual contrast within the colon. Aortic atherosclerosis. IMPRESSION: 1. No appreciable thoracic vertebral compression fracture. 2. Mild Dextrocurvature of the upper thoracic spine, possibly positional. 3. Thoracic spondylosis, as described. 4. Posterior spinal fusion construct spanning the T11-L3 levels. Known pathologic L1 vertebral compression fracture better assessed on the same day lumbar spine radiographs.  5. Aortic Atherosclerosis (ICD10-I70.0). Electronically Signed   By: Kellie Simmering D.O.   On: 03/30/2022 14:01   DG THORACOLUMABAR SPINE  Result Date: 03/28/2022 CLINICAL DATA:  T11-L3 fusion.  Back pain. EXAM: THORACOLUMBAR SPINE 1V COMPARISON:  Lumbar spine CT 03/16/2022 FINDINGS: There is dense contrast material throughout the entire colon which limits evaluation, particularly on the lateral view. Posterior fusion rods are seen bilaterally with transpedicular screws at T11, T12, L2 and L3, unchanged in position. There is no evidence for hardware loosening. Lucent lesions throughout the L1 vertebral body are grossly unchanged. Alignment is grossly stable with grade 1 anterolisthesis at L4-L5. IMPRESSION: 1. Stable T11-L3 posterior fusion. No evidence for hardware loosening. 2. Lucent lesions throughout the L1 vertebral body are grossly unchanged. Electronically Signed   By: Ronney Asters M.D.   On: 03/28/2022 00:21   IR Radiologist Eval & Mgmt  Result Date: 03/24/2022 EXAM: NEW PATIENT OFFICE VISIT CHIEF COMPLAINT: See below HISTORY OF PRESENT ILLNESS: See below REVIEW OF SYSTEMS: See below PHYSICAL EXAMINATION: See below ASSESSMENT AND PLAN: Please refer to completed note in the electronic medical record on Jericho Mugweru, MD [facility] Electronically Signed   By: Michaelle Birks M.D.   On: 03/24/2022 13:32   DG ESOPHAGUS W DOUBLE CM (HD)  Result Date:  03/24/2022 CLINICAL DATA:  Dysphagia, lung cancer EXAM: ESOPHOGRAM / BARIUM SWALLOW / BARIUM TABLET STUDY TECHNIQUE: Combined double contrast and single contrast examination performed using effervescent crystals, thick barium liquid, and thin barium liquid. The patient was observed with fluoroscopy swallowing a 13 mm barium sulphate tablet. FLUOROSCOPY: Radiation Exposure Index (as provided by the fluoroscopic device): 5 mGy COMPARISON:  None Available. FINDINGS: Normal pharyngeal anatomy and motility. Contrast flowed freely through the esophagus without evidence of a mass. Normal esophageal mucosa without evidence of irregularity or ulceration. Mild tertiary contractions of the esophagus as can be seen with mild spasm. Mild gastroesophageal reflux. Mild relative narrowing of the distal esophagus just proximal to the gastroesophageal junction concerning for a mild stricture restricting the passage of a 13 mm barium tablet. No definite hiatal hernia was demonstrated. IMPRESSION: 1. Mild relative narrowing of the distal esophagus just proximal to the gastroesophageal junction concerning for a mild stricture restricting the passage of a 13 mm barium tablet. 2. Mild tertiary contractions of the esophagus as can be seen with mild spasm. Electronically Signed   By: Kathreen Devoid M.D.   On: 03/24/2022 10:17    PERFORMANCE STATUS (ECOG) : 2 - Symptomatic, <50% confined to bed  Review of Systems Unless otherwise noted, a complete review of systems is negative.  Physical Exam General: NAD Cardiovascular: regular rate and rhythm Pulmonary: clear ant fields Abdomen: soft, nontender, + bowel sounds GU: no suprapubic tenderness Extremities: no edema, no joint deformities Skin: no rashes Neurological: Weakness but otherwise nonfocal  IMPRESSION/PLAN: Stage IV lung cancer -on treatment with carbo Alimta Keytruda.  Patient returned on 1/26 for consideration of cycle 2.  IDA -patient is receiving Venofer  today  Dehydration -proceed with IV fluids and intermittent supportive care  Dysphagia -patient is pending GI workup next week  Will bring patient back next week for labs/fluids  Patient expressed understanding and was in agreement with this plan. She also understands that She can call clinic at any time with any questions, concerns, or complaints.   Thank you for allowing me to participate in the care of this very pleasant patient.   Time  Total: 15 minutes  Visit consisted of counseling and education dealing with the complex and emotionally intense issues of symptom management in the setting of serious illness.Greater than 50%  of this time was spent counseling and coordinating care related to the above assessment and plan.  Signed by: Altha Harm, PhD, NP-C

## 2022-04-22 NOTE — Telephone Encounter (Signed)
Pt wanted smaller tanks to carry when out of home and Hayley filled out paper and then it was sent to dr Janese Banks to sign. She signed it yest. And I sent the fax off today. It wnet to [facility] . Phone [phone] and fax [phone]. The fax transmission went through

## 2022-04-22 NOTE — Progress Notes (Signed)
Met with patient during follow up visit with Josh after receiving first cycle of chemo last week. Pt states that she is fatigued today with persistent nausea. Unable to eat/drink adequately due to nausea and esophageal stricture. Pt scheduled to follow up with [facility] GI next week to hopefully address her symptoms. Pt did not have any navigational needs today. Instructed pt to call with any further questions or needs. Pt verbalized understanding. Nothing further needed at this time.

## 2022-04-23 ENCOUNTER — Telehealth: Payer: Self-pay | Admitting: Oncology

## 2022-04-23 ENCOUNTER — Telehealth: Payer: Self-pay | Admitting: *Deleted

## 2022-04-23 NOTE — Addendum Note (Signed)
Addended by: Glory Buff on: 04/23/2022 09:45 AM   Modules accepted: Orders

## 2022-04-23 NOTE — Telephone Encounter (Signed)
Called patient- spoke with patient's daughter, Toniann Fail. Provided instructions for holding eliquis 2 days prior to her upcoming lumbar procedure (04/30/22). She may resume back on her eliquis the day after the procedure. Daughter stated that they were already aware of this but thanked me for calling and reminding them.

## 2022-04-23 NOTE — Telephone Encounter (Signed)
Received notification from interventional radiology that patient will be going for an osteocool procedure on 04/30/2021.  Patient was to have small segmental pulmonary embolism that was noted on 03/31/2022.  She is currently on Eliquis for the same.  Given that the PE was small and patient currently has significant back pain from metastatic lung cancer it would be okay to hold Eliquis for 2 days prior to procedure and 6 restart Eliquis the night of procedure.  Dr. Owens Shark, MD, MPH [facility] [phone] 04/23/2022 11:40 AM

## 2022-04-27 ENCOUNTER — Ambulatory Visit: Payer: Medicare HMO | Admitting: Hospice and Palliative Medicine

## 2022-04-27 ENCOUNTER — Ambulatory Visit: Payer: Medicare HMO

## 2022-04-27 ENCOUNTER — Other Ambulatory Visit: Payer: Medicare HMO

## 2022-04-27 MED FILL — Iron Sucrose Inj 20 MG/ML (Fe Equiv): INTRAVENOUS | Qty: 10 | Status: AC

## 2022-04-28 ENCOUNTER — Other Ambulatory Visit: Payer: Self-pay | Admitting: *Deleted

## 2022-04-28 ENCOUNTER — Inpatient Hospital Stay: Payer: Medicare HMO

## 2022-04-28 DIAGNOSIS — C3491 Malignant neoplasm of unspecified part of right bronchus or lung: Secondary | ICD-10-CM

## 2022-04-28 DIAGNOSIS — E860 Dehydration: Secondary | ICD-10-CM

## 2022-04-28 DIAGNOSIS — Z5111 Encounter for antineoplastic chemotherapy: Secondary | ICD-10-CM | POA: Diagnosis not present

## 2022-04-28 LAB — CBC WITH DIFFERENTIAL/PLATELET
Abs Immature Granulocytes: 0 10*3/uL (ref 0.00–0.07)
Basophils Absolute: 0 10*3/uL (ref 0.0–0.1)
Basophils Relative: 0 %
Eosinophils Absolute: 0 10*3/uL (ref 0.0–0.5)
Eosinophils Relative: 1 %
HCT: 28.8 % — ABNORMAL LOW (ref 36.0–46.0)
Hemoglobin: 9.1 g/dL — ABNORMAL LOW (ref 12.0–15.0)
Immature Granulocytes: 0 %
Lymphocytes Relative: 37 %
Lymphs Abs: 0.5 10*3/uL — ABNORMAL LOW (ref 0.7–4.0)
MCH: 25.5 pg — ABNORMAL LOW (ref 26.0–34.0)
MCHC: 31.6 g/dL (ref 30.0–36.0)
MCV: 80.7 fL (ref 80.0–100.0)
Monocytes Absolute: 0.3 10*3/uL (ref 0.1–1.0)
Monocytes Relative: 25 %
Neutro Abs: 0.5 10*3/uL — ABNORMAL LOW (ref 1.7–7.7)
Neutrophils Relative %: 37 %
Platelets: 106 10*3/uL — ABNORMAL LOW (ref 150–400)
RBC: 3.57 MIL/uL — ABNORMAL LOW (ref 3.87–5.11)
RDW: 16.7 % — ABNORMAL HIGH (ref 11.5–15.5)
WBC: 1.2 10*3/uL — CL (ref 4.0–10.5)
nRBC: 0 % (ref 0.0–0.2)

## 2022-04-28 LAB — BASIC METABOLIC PANEL
Anion gap: 8 (ref 5–15)
BUN: 11 mg/dL (ref 8–23)
CO2: 26 mmol/L (ref 22–32)
Calcium: 9 mg/dL (ref 8.9–10.3)
Chloride: 96 mmol/L — ABNORMAL LOW (ref 98–111)
Creatinine, Ser: 0.53 mg/dL (ref 0.44–1.00)
GFR, Estimated: >60 mL/min (ref >60)
Glucose, Bld: 154 mg/dL — ABNORMAL HIGH (ref 70–99)
Potassium: 3.7 mmol/L (ref 3.5–5.1)
Sodium: 130 mmol/L — ABNORMAL LOW (ref 135–145)

## 2022-04-28 MED ORDER — SODIUM CHLORIDE 0.9 % IV SOLN
Freq: Once | INTRAVENOUS | Status: AC
  Filled 2022-04-28: qty 250

## 2022-04-28 MED ORDER — HEPARIN SOD (PORK) LOCK FLUSH 100 UNIT/ML IV SOLN
500.0000 [IU] | Freq: Once | INTRAVENOUS | Status: AC
  Administered 2022-04-28: 500 [IU] via INTRAVENOUS
  Filled 2022-04-28: qty 5

## 2022-04-28 MED ORDER — OXYCODONE HCL 20 MG PO TABS: 10.0000 mg | ORAL_TABLET | ORAL | 0 refills | Status: DC | PRN

## 2022-04-28 MED ORDER — SODIUM CHLORIDE 0.9% FLUSH
10.0000 mL | Freq: Once | INTRAVENOUS | Status: AC
  Administered 2022-04-28: 10 mL via INTRAVENOUS
  Filled 2022-04-28: qty 10

## 2022-04-29 ENCOUNTER — Other Ambulatory Visit: Payer: Self-pay | Admitting: Radiology

## 2022-04-29 ENCOUNTER — Ambulatory Visit: Payer: Medicare HMO | Admitting: Hospice and Palliative Medicine

## 2022-04-29 ENCOUNTER — Ambulatory Visit: Payer: Medicare HMO

## 2022-04-29 ENCOUNTER — Other Ambulatory Visit: Payer: Medicare HMO

## 2022-04-29 DIAGNOSIS — Z01812 Encounter for preprocedural laboratory examination: Secondary | ICD-10-CM

## 2022-04-29 NOTE — H&P (Signed)
Chief Complaint: Patient was seen in consultation today for pathologic L1 compression fracture, osseous metastasis at the request of Laurette Schimke R  Referring Physician(s): Malachy Moan  Supervising Physician: Roanna Banning  Patient Status: [facility] - Out-pt  History of Present Illness: Amber Glenn is a 72 y.o. female with PMH significant for tobacco abuse and recently diagnosed metastatic lung cancer. Patient is well-known to IR service having previous supraclavicular lymph node biopsy 02/11/2022 that revealed metastatic adenocarcinoma and port placement 04/14/2022.  She presented to ED 02/10/2022 status post mechanical fall.  Imaging at that time revealed L1 pathologic fracture.  Patient was referred to IR by Laurette Schimke, PA for evaluation of percutaneous radiofrequency ablation for L1 spinal lesion and compression fracture. She had telephone consult 03/24/2022 with Dr. Milford Cage. At that time, the patient expressed interest in pursuing minimally invasive procedure to assist with her significant lower back and left hip pain and decided to move forward with biopsy, RFA Osteocool and kyphoplasty for L1 osseous lesion/compression fracture.   Pt denies fever, chills, SOB, CP, dizziness or HA.  She endorses abdominal pain, fatigue, difficulty eating, abdominal pain, vomiting and weight loss.  She is NPO per order.  Last Eliquis was 2 days ago.   Past Medical History:  Diagnosis Date   Arthritis    hands and feet   Cataract    right   Complication of anesthesia    sometimes have a hard time waking up   Diabetes mellitus    Borderline Diabetes   GERD (gastroesophageal reflux disease)    HTN (hypertension)    Hyperlipidemia    Motion sickness    Post corneal transplant    right    Past Surgical History:  Procedure Laterality Date   APPLICATION OF INTRAOPERATIVE CT SCAN  02/15/2022   Procedure: APPLICATION OF INTRAOPERATIVE CT SCAN;  Surgeon: Venetia Night, MD;  Location:  [facility] ORS;  Service: Neurosurgery;;   BASAL CELL CARCINOMA EXCISION     Dr. Lorn Junes   CARPAL TUNNEL RELEASE     right   COLONOSCOPY WITH PROPOFOL N/A 12/03/2015   Procedure: COLONOSCOPY WITH PROPOFOL;  Surgeon: Scot Jun, MD;  Location: [facility] ENDOSCOPY;  Service: Endoscopy;  Laterality: N/A;   COLONOSCOPY WITH PROPOFOL N/A 02/22/2020   Procedure: COLONOSCOPY WITH PROPOFOL;  Surgeon: Regis Bill, MD;  Location: [facility] ENDOSCOPY;  Service: Endoscopy;  Laterality: N/A;   ESOPHAGOGASTRODUODENOSCOPY (EGD) WITH PROPOFOL N/A 02/22/2020   Procedure: ESOPHAGOGASTRODUODENOSCOPY (EGD) WITH PROPOFOL;  Surgeon: Regis Bill, MD;  Location: [facility] ENDOSCOPY;  Service: Endoscopy;  Laterality: N/A;   HAMMER TOE SURGERY Right 05/23/2019   Procedure: HAMMER TOE CORRECTION;  Surgeon: Gwyneth Revels, DPM;  Location: [facility];  Service: Podiatry;  Laterality: Right;  Diabetic   IR IMAGING GUIDED PORT INSERTION  04/14/2022   IR RADIOLOGIST EVAL & MGMT  03/24/2022   OSTECTOMY Right 05/23/2019   Procedure: DOUBLE OSTEOTOMY RIGHT;  Surgeon: Gwyneth Revels, DPM;  Location: [facility];  Service: Podiatry;  Laterality: Right;   SKIN CANCER EXCISION      Allergies: Patient has no known allergies.  Medications: Prior to Admission medications   Medication Sig Start Date End Date Taking? Authorizing Provider  acetaminophen (TYLENOL) 500 MG tablet Take 500 mg by mouth every 6 (six) hours as needed for moderate pain, mild pain, fever or headache.    [provider]  apixaban (ELIQUIS) 5 MG TABS tablet Two tabs po twice a day for six days then one tab po  twice a day afterwards 04/01/22   Alford Highland, MD  Ascorbic Acid (VITAMIN C) 1000 MG tablet Take 1,000 mg by mouth daily.      [provider]  cyclobenzaprine (FLEXERIL) 5 MG tablet Take 1 tablet (5 mg total) by mouth 3 (three) times daily as needed for muscle spasms. 03/26/22   Borders, Daryl Eastern, NP  dexamethasone  (DECADRON) 4 MG tablet Take 1 tab 2 times daily starting day before pemetrexed. Then take 2 tabs daily x 3 days starting day after carboplatin. Take with food. Patient not taking: Reported on 04/22/2022 04/06/22   Creig Hines, MD  Docusate Calcium (STOOL SOFTENER PO) Take 1 capsule by mouth daily as needed (constipation).    [provider]  folic acid (FOLVITE) 1 MG tablet Take 1 tablet (1 mg total) by mouth daily. Start 7 days before pemetrexed chemotherapy. Continue until 21 days after pemetrexed completed. 04/06/22   Creig Hines, MD  gabapentin (NEURONTIN) 300 MG capsule Take 1 capsule (300 mg total) by mouth 3 (three) times daily. 03/23/22   Borders, Daryl Eastern, NP  lidocaine-prilocaine (EMLA) cream Apply 1 Application topically as needed. 04/14/22   Creig Hines, MD  LORazepam (ATIVAN) 0.5 MG tablet Take 1 tablet (0.5 mg total) by mouth every 8 (eight) hours as needed for anxiety. 03/30/22   Borders, Daryl Eastern, NP  melatonin 5 MG TABS Take 1 tablet (5 mg total) by mouth at bedtime as needed. 02/18/22 05/19/22  Leeroy Bock, MD  metoprolol tartrate (LOPRESSOR) 25 MG tablet Take 1 tablet (25 mg total) by mouth 2 (two) times daily. 04/01/22   Alford Highland, MD  Multiple Vitamin (MULTIVITAMIN) capsule Take 1 capsule by mouth daily.      [provider]  omeprazole (PRILOSEC) 20 MG capsule Take 1 capsule (20 mg total) by mouth daily. 12/20/14   Shelia Media, MD  ondansetron (ZOFRAN) 8 MG tablet Take 1 tablet (8 mg total) by mouth every 8 (eight) hours as needed for nausea or vomiting. Start on the third day after carboplatin. 04/06/22   Creig Hines, MD  oxyCODONE ER [facility]) 18 MG C12A Take 1 capsule by mouth every 12 (twelve) hours. 04/15/22   Borders, Daryl Eastern, NP  Oxycodone HCl 20 MG TABS Take 0.5-1 tablets (10-20 mg total) by mouth every 4 (four) hours as needed. 04/28/22   Borders, Daryl Eastern, NP  polyethylene glycol (MIRALAX / GLYCOLAX) 17 g packet Take 17 g by  mouth daily. 02/18/22   Leeroy Bock, MD  prednisoLONE acetate (PRED MILD) 0.12 % ophthalmic suspension Place 1 drop into the right eye daily.    [provider]  prochlorperazine (COMPAZINE) 10 MG tablet Take 1 tablet (10 mg total) by mouth every 6 (six) hours as needed for nausea or vomiting. 03/11/22   Borders, Daryl Eastern, NP  simvastatin (ZOCOR) 40 MG tablet TAKE 1 TABLET EVERY DAY  AT  6:00 PM 12/20/14   Shelia Media, MD     Family History  Problem Relation Age of Onset   Heart disease Mother    Heart disease Father    Heart disease Maternal Grandmother    Heart disease Maternal Grandfather    Breast cancer Sister 77    Social History   Socioeconomic History   Marital status: Widowed    Spouse name: Not on file   Number of children: Not on file   Years of education: Not on file   Highest education  level: Not on file  Occupational History    Employer: [employer]    Comment: Currently not employed  Tobacco Use   Smoking status: Former    Types: Cigarettes    Quit date: 01/13/2009    Years since quitting: 13.3   Smokeless tobacco: Never  Substance and Sexual Activity   Alcohol use: No   Drug use: No   Sexual activity: Yes    Birth control/protection: Post-menopausal  Other Topics Concern   Not on file  Social History Narrative   Not on file   Social Determinants of Health   Financial Resource Strain: Not on file  Food Insecurity: No Food Insecurity (03/31/2022)   Hunger Vital Sign    Worried About Running Out of Food in the Last Year: Never true    Ran Out of Food in the Last Year: Never true  Transportation Needs: No Transportation Needs (03/31/2022)   PRAPARE - Administrator, Civil Service (Medical): No    Lack of Transportation (Non-Medical): No  Physical Activity: Not on file  Stress: Not on file  Social Connections: Not on file     Review of Systems: A 12 point ROS discussed and pertinent positives are indicated in the HPI  above.  All other systems are negative.  Review of Systems  Constitutional:  Positive for appetite change, fatigue and unexpected weight change. Negative for chills and fever.  Respiratory: Negative.  Negative for shortness of breath.   Cardiovascular:  Negative for chest pain and leg swelling.  Gastrointestinal:  Positive for abdominal pain and vomiting. Negative for nausea.  Musculoskeletal:  Positive for back pain.  Neurological:  Negative for dizziness and headaches.    Vital Signs: BP (!) 136/92   Pulse 100   Temp 97.7 F (36.5 C) (Oral)   Resp 20   Ht 5' (1.524 m)   Wt 115 lb (52.2 kg)   SpO2 96%   BMI 22.46 kg/m    Physical Exam Vitals reviewed.  Constitutional:      General: She is not in acute distress.    Appearance: Normal appearance. She is ill-appearing.  HENT:     Mouth/Throat:     Mouth: Mucous membranes are dry.     Pharynx: Oropharynx is clear.  Eyes:     Extraocular Movements: Extraocular movements intact.     Pupils: Pupils are equal, round, and reactive to light.  Cardiovascular:     Rate and Rhythm: Normal rate and regular rhythm.     Pulses: Normal pulses.     Heart sounds: Normal heart sounds. No murmur heard. Pulmonary:     Effort: Pulmonary effort is normal. No respiratory distress.     Breath sounds: Normal breath sounds.  Abdominal:     General: Bowel sounds are normal. There is no distension.     Palpations: Abdomen is soft.     Tenderness: There is no abdominal tenderness. There is no guarding.  Musculoskeletal:     Right lower leg: No edema.     Left lower leg: No edema.  Skin:    General: Skin is warm and dry.  Neurological:     Mental Status: She is alert and oriented to person, place, and time.  Psychiatric:        Mood and Affect: Mood normal.        Behavior: Behavior normal.        Thought Content: Thought content normal.        Judgment: Judgment  normal.     Imaging: IR IMAGING GUIDED PORT INSERTION  Result Date:  04/14/2022 INDICATION: non small cell lung cancer, need port for chemo EXAM: Chest port placement using ultrasound and fluoroscopic guidance MEDICATIONS: Documented in the EMR ANESTHESIA/SEDATION: Moderate (conscious) sedation was employed during this procedure. A total of Versed 2 mg and Fentanyl 100 mcg was administered intravenously. Moderate Sedation Time: 22 minutes. The patient's level of consciousness and vital signs were monitored continuously by radiology nursing throughout the procedure under my direct supervision. FLUOROSCOPY TIME:  Fluoroscopy Time: 0.3 minutes (1 mGy) COMPLICATIONS: None immediate. PROCEDURE: Informed written consent was obtained from the patient after a thorough discussion of the procedural risks, benefits and alternatives. All questions were addressed. Maximal Sterile Barrier Technique was utilized including caps, mask, sterile gowns, sterile gloves, sterile drape, hand hygiene and skin antiseptic. A timeout was performed prior to the initiation of the procedure. The patient was placed supine on the exam table. The right neck and chest was prepped and draped in the standard sterile fashion. A preliminary ultrasound of the right neck was performed and demonstrates a patent right internal jugular vein. A permanent ultrasound image was stored in the electronic medical record. The overlying skin was anesthetized with 1% Lidocaine. Using ultrasound guidance, access was obtained into the right internal jugular vein using a 21 gauge micropuncture set. A wire was advanced into the SVC, a short incision was made at the puncture site, and serial dilatation performed. Next, in an ipsilateral infraclavicular location, an incision was made at the site of the subcutaneous reservoir. Blunt dissection was used to open a pocket to contain the reservoir. A subcutaneous tunnel was then created from the port site to the puncture site. A(n) 8 Fr single lumen catheter was advanced through the tunnel. The  catheter was attached to the port and this was placed in the subcutaneous pocket. Under fluoroscopic guidance, a peel away sheath was placed, and the catheter was trimmed to the appropriate length and was advanced into the central veins. The catheter length is 23 cm. The tip of the catheter lies near the superior cavoatrial junction. The port flushes and aspirates appropriately. The port was flushed and locked with heparinized saline. The port pocket was closed in 2 layers using 3-0 and 4-0 Vicryl/absorbable suture. Dermabond was also applied to both incisions. The patient tolerated the procedure well and was transferred to recovery in stable condition. IMPRESSION: Successful placement of a right sided chest port via the right internal jugular vein. The port is ready for immediate use. Electronically Signed   By: Olive Bass M.D.   On: 04/14/2022 15:37   ECHOCARDIOGRAM COMPLETE  Result Date: 04/01/2022    ECHOCARDIOGRAM REPORT   Patient Name:   Amber Glenn Date of Exam: 04/01/2022 Medical Rec #:  [id number]      Height:       60.0 in Accession #:    [id number]     Weight:       119.0 lb Date of Birth:  1950-04-28     BSA:          1.497 m Patient Age:    71 years       BP:           123/49 mmHg Patient Gender: F              HR:           105 bpm. Exam Location:  [facility] Procedure: 2D Echo, Color Doppler and  Cardiac Doppler Indications:     I26.09 Pulmonary Embolus  History:         Patient has no prior history of Echocardiogram examinations.                  Risk Factors:Hypertension, Diabetes and Dyslipidemia.  Sonographer:     Humphrey Rolls Referring Phys:  [id number] XYXOHILA AGBATA Diagnosing Phys: Julien Nordmann MD IMPRESSIONS  1. Left ventricular ejection fraction, by estimation, is 50 to 55%. The left ventricle has low normal function. The left ventricle has no regional wall motion abnormalities. Left ventricular diastolic parameters are consistent with Grade II diastolic dysfunction (pseudonormalization).   2. Right ventricular systolic function is normal. The right ventricular size is normal. Tricuspid regurgitation signal is inadequate for assessing PA pressure.  3. The mitral valve is normal in structure. Mild mitral valve regurgitation. No evidence of mitral stenosis.  4. The aortic valve is tricuspid. Aortic valve regurgitation is not visualized. Aortic valve sclerosis is present, with no evidence of aortic valve stenosis.  5. The inferior vena cava is normal in size with greater than 50% respiratory variability, suggesting right atrial pressure of 3 mmHg. FINDINGS  Left Ventricle: Left ventricular ejection fraction, by estimation, is 50 to 55%. The left ventricle has low normal function. The left ventricle has no regional wall motion abnormalities. The left ventricular internal cavity size was normal in size. There is no left ventricular hypertrophy. Left ventricular diastolic parameters are consistent with Grade II diastolic dysfunction (pseudonormalization). Right Ventricle: The right ventricular size is normal. No increase in right ventricular wall thickness. Right ventricular systolic function is normal. Tricuspid regurgitation signal is inadequate for assessing PA pressure. Left Atrium: Left atrial size was normal in size. Right Atrium: Right atrial size was normal in size. Pericardium: There is no evidence of pericardial effusion. Mitral Valve: The mitral valve is normal in structure. Mild mitral valve regurgitation. No evidence of mitral valve stenosis. Tricuspid Valve: The tricuspid valve is normal in structure. Tricuspid valve regurgitation is mild . No evidence of tricuspid stenosis. Aortic Valve: The aortic valve is tricuspid. Aortic valve regurgitation is not visualized. Aortic valve sclerosis is present, with no evidence of aortic valve stenosis. Aortic valve mean gradient measures 3.0 mmHg. Aortic valve peak gradient measures 5.3  mmHg. Aortic valve area, by VTI measures 2.39 cm. Pulmonic Valve:  The pulmonic valve was normal in structure. Pulmonic valve regurgitation is not visualized. No evidence of pulmonic stenosis. Aorta: The aortic root is normal in size and structure. Venous: The inferior vena cava is normal in size with greater than 50% respiratory variability, suggesting right atrial pressure of 3 mmHg. IAS/Shunts: No atrial level shunt detected by color flow Doppler.  LEFT VENTRICLE PLAX 2D LVIDd:         4.20 cm   Diastology LVIDs:         3.20 cm   LV e' medial:    5.77 cm/s LV PW:         1.00 cm   LV E/e' medial:  19.9 LV IVS:        0.60 cm   LV e' lateral:   5.33 cm/s LVOT diam:     2.00 cm   LV E/e' lateral: 21.6 LV SV:         53 LV SV Index:   35 LVOT Area:     3.14 cm  RIGHT VENTRICLE RV Basal diam:  3.50 cm RV S prime:     9.90 cm/s  LEFT ATRIUM           Index        RIGHT ATRIUM           Index LA diam:      2.80 cm 1.87 cm/m   RA Area:     11.90 cm LA Vol (A2C): 29.0 ml 19.37 ml/m  RA Volume:   28.60 ml  19.10 ml/m LA Vol (A4C): 37.5 ml 25.05 ml/m  AORTIC VALVE                    PULMONIC VALVE AV Area (Vmax):    2.16 cm     PV Vmax:       1.04 m/s AV Area (Vmean):   2.28 cm     PV Vmean:      72.400 cm/s AV Area (VTI):     2.39 cm     PV VTI:        0.188 m AV Vmax:           115.00 cm/s  PV Peak grad:  4.3 mmHg AV Vmean:          85.400 cm/s  PV Mean grad:  2.0 mmHg AV VTI:            0.221 m AV Peak Grad:      5.3 mmHg AV Mean Grad:      3.0 mmHg LVOT Vmax:         79.00 cm/s LVOT Vmean:        61.900 cm/s LVOT VTI:          0.168 m LVOT/AV VTI ratio: 0.76  AORTA Ao Root diam: 2.60 cm MITRAL VALVE MV Area (PHT): 6.87 cm     SHUNTS MV Decel Time: 111 msec     Systemic VTI:  0.17 m MV E velocity: 115.00 cm/s  Systemic Diam: 2.00 cm Julien Nordmann MD Electronically signed by Julien Nordmann MD Signature Date/Time: 04/01/2022/1:10:44 PM    Final     Labs:  CBC: Recent Labs    03/31/22 0531 04/16/22 0829 04/22/22 0936 04/28/22 1018  WBC 12.1* 6.6 4.5 1.2*  HGB 10.5*  10.1* 10.3* 9.1*  HCT 35.5* 31.6* 31.7* 28.8*  PLT 223 405* 243 106*    COAGS: No results for input(s): "INR", "APTT" in the last 8760 hours.  BMP: Recent Labs    03/31/22 0531 04/16/22 0829 04/22/22 0936 04/28/22 1018  NA 134* 128* 128* 130*  K 4.5 4.6 4.5 3.7  CL 101 91* 93* 96*  CO2 26 31 25 26   GLUCOSE 122* 138* 160* 154*  BUN 26* 24* 18 11  CALCIUM 9.1 9.7 9.3 9.0  CREATININE 0.71 0.66 0.59 0.53  GFRNONAA >60 >60 >60 >60    LIVER FUNCTION TESTS: Recent Labs    02/24/22 1040 03/31/22 0531 04/16/22 0829  BILITOT 0.4 0.8 0.2*  AST 20 21 21   ALT 15 19 16   ALKPHOS 94 74 62  PROT 6.8 6.3* 7.2  ALBUMIN 3.4* 3.2* 3.5    TUMOR MARKERS: No results for input(s): "AFPTM", "CEA", "CA199", "CHROMGRNA" in the last 8760 hours.  Assessment and Plan:  72 yo female presents to IR for radiofrequency ablation, biopsy and kyphoplasty of L1 compression fracture/osseous lesion.   Pt resting on stretcher with family member at bedside.  She is A&O, calm and pleasant.  She is in no distress.  Today's labs pending.   Risks and benefits of L1 biopsy, percutaneous radiofrequency ablation "osteocool"  and kyphoplasty with moderate sedation were discussed with the patient including, but not limited to education regarding the natural healing process of compression fractures without intervention, bleeding, infection, cement migration which may cause spinal cord damage, paralysis, pulmonary embolism or even death.  This interventional procedure involves the use of X-rays and because of the nature of the planned procedure, it is possible that we will have prolonged use of X-ray fluoroscopy.  Potential radiation risks to you include (but are not limited to) the following: - A slightly elevated risk for cancer  several years later in life. This risk is typically less than 0.5% percent. This risk is low in comparison to the normal incidence of human cancer, which is 33% for women and 50% for  men according to the American Cancer Society. - Radiation induced injury can include skin redness, resembling a rash, tissue breakdown / ulcers and hair loss (which can be temporary or permanent).   The likelihood of either of these occurring depends on the difficulty of the procedure and whether you are sensitive to radiation due to previous procedures, disease, or genetic conditions.   IF your procedure requires a prolonged use of radiation, you will be notified and given written instructions for further action.  It is your responsibility to monitor the irradiated area for the 2 weeks following the procedure and to notify your physician if you are concerned that you have suffered a radiation induced injury.    All of the patient's questions were answered, patient is agreeable to proceed.  Consent signed and in chart.   Thank you for this interesting consult.  I greatly enjoyed meeting Amber Glenn and look forward to participating in their care.  A copy of this report was sent to the requesting provider on this date.  Electronically Signed: Shon Hough, NP 04/30/2022, 1:16 PM   I spent a total of 20 minutes in face to face in clinical consultation, greater than 50% of which was counseling/coordinating care for pathologic L1 compression fracture/osseous metstasis.

## 2022-04-29 NOTE — Progress Notes (Signed)
Patient for Kypho and Osteocool on Friday 04/30/2022, I called and spoke with patient's daughter, Toniann Fail on the phone and gave pre-procedure instructions. Pt's daughter was made aware to be here at 11:30a at the new entrance, last dose of Eliquis was Tuesday, 04/27/2022, NPO after MN prior to procedure as well as driver post procedure/recovery/discharge. Pt's daughter stated understanding. Called 04/26/2022 and 04/23/2022

## 2022-04-30 ENCOUNTER — Other Ambulatory Visit: Payer: Self-pay

## 2022-04-30 ENCOUNTER — Encounter: Payer: Self-pay | Admitting: Certified Registered"

## 2022-04-30 ENCOUNTER — Other Ambulatory Visit: Payer: Self-pay | Admitting: *Deleted

## 2022-04-30 ENCOUNTER — Ambulatory Visit
Admission: RE | Admit: 2022-04-30 | Discharge: 2022-04-30 | Disposition: A | Discharge status: Home / Self Care | Payer: Medicare HMO | Source: Ambulatory Visit | Attending: Hospice and Palliative Medicine | Admitting: Hospice and Palliative Medicine

## 2022-04-30 ENCOUNTER — Encounter: Payer: Self-pay | Admitting: *Deleted

## 2022-04-30 DIAGNOSIS — M4856XA Collapsed vertebra, not elsewhere classified, lumbar region, initial encounter for fracture: Secondary | ICD-10-CM

## 2022-04-30 DIAGNOSIS — Z7901 Long term (current) use of anticoagulants: Secondary | ICD-10-CM | POA: Diagnosis not present

## 2022-04-30 DIAGNOSIS — C3490 Malignant neoplasm of unspecified part of unspecified bronchus or lung: Secondary | ICD-10-CM | POA: Diagnosis present

## 2022-04-30 DIAGNOSIS — C7951 Secondary malignant neoplasm of bone: Secondary | ICD-10-CM | POA: Diagnosis not present

## 2022-04-30 DIAGNOSIS — Z01812 Encounter for preprocedural laboratory examination: Secondary | ICD-10-CM

## 2022-04-30 HISTORY — PX: IR BONE TUMOR(S)RF ABLATION: IMG2284

## 2022-04-30 HISTORY — PX: IR KYPHO LUMBAR INC FX REDUCE BONE BX UNI/BIL CANNULATION INC/IMAGING: IMG5519

## 2022-04-30 LAB — PROTIME-INR
INR: 1.1 (ref 0.8–1.2)
Prothrombin Time: 13.9 seconds (ref 11.4–15.2)

## 2022-04-30 MED ORDER — CEFAZOLIN SODIUM-DEXTROSE 2-4 GM/100ML-% IV SOLN
2.0000 g | INTRAVENOUS | Status: DC
  Filled 2022-04-30 (×2): qty 100

## 2022-04-30 MED ORDER — MIDAZOLAM HCL 5 MG/5ML IJ SOLN
INTRAMUSCULAR | Status: AC | PRN
  Administered 2022-04-30 (×5): .5 mg via INTRAVENOUS

## 2022-04-30 MED ORDER — SODIUM CHLORIDE 0.9 % IV SOLN: INTRAVENOUS | Status: DC

## 2022-04-30 MED ORDER — MIDAZOLAM HCL 2 MG/2ML IJ SOLN
INTRAMUSCULAR | Status: AC
  Filled 2022-04-30: qty 4

## 2022-04-30 MED ORDER — OXYCODONE HCL 20 MG PO TABS: 10.0000 mg | ORAL_TABLET | ORAL | 0 refills | Status: DC | PRN

## 2022-04-30 MED ORDER — FENTANYL CITRATE (PF) 100 MCG/2ML IJ SOLN
INTRAMUSCULAR | Status: AC | PRN
  Administered 2022-04-30 (×4): 25 ug via INTRAVENOUS

## 2022-04-30 MED ORDER — LIDOCAINE HCL (PF) 1 % IJ SOLN
INTRAMUSCULAR | Status: AC
  Administered 2022-04-30: 20 mL
  Filled 2022-04-30: qty 30

## 2022-04-30 MED ORDER — MIDAZOLAM HCL 2 MG/2ML IJ SOLN
INTRAMUSCULAR | Status: AC | PRN
  Administered 2022-04-30: .5 mg via INTRAVENOUS

## 2022-04-30 MED ORDER — CEFAZOLIN SODIUM-DEXTROSE 1-4 GM/50ML-% IV SOLN
INTRAVENOUS | Status: AC | PRN
  Administered 2022-04-30: 2 g via INTRAVENOUS

## 2022-04-30 MED ORDER — BUPIVACAINE HCL (PF) 0.5 % IJ SOLN
INTRAMUSCULAR | Status: AC
  Administered 2022-04-30: 20 mL
  Filled 2022-04-30: qty 30

## 2022-04-30 MED ORDER — FENTANYL CITRATE (PF) 100 MCG/2ML IJ SOLN
INTRAMUSCULAR | Status: AC
  Filled 2022-04-30: qty 2

## 2022-04-30 NOTE — Procedures (Signed)
Vascular and Interventional Radiology Procedure Note  Patient: ZIYAN HILLMER DOB: 1951/02/25 Medical Record Number: [id number] Note Date/Time: 04/30/22 3:38 PM   Performing Physician: Roanna Banning, MD Assistant(s): None  Diagnosis: Hx of met lung CA. Symptomatic L1 pathologic vertebral body fracture.  Procedure: L1 BONE BIOPSY, PERCUTANEOUS RADIOFREQUENCY "OsteoCool" ABLATION  and VERTEBRAL BODY KYPHOPLASTY   Anesthesia: Conscious Sedation Complications: None Estimated Blood Loss: Minimal Specimens: Sent for Pathology  Findings:  Successful CT and Fluoroscopy-guided L1 vertebral body biopsy, "OsteoCool" RFA and bipedicular kyphoplasty. A total of 8 mL PMMA was used. Hemostasis of the tract was achieved using Manual Pressure.   Plan: Bed rest for 2 hours.  See detailed procedure note with images in PACS. The patient tolerated the procedure well without incident or complication and was returned to Recovery in stable condition.    Roanna Banning, MD [facility]   Pager. [phone] Clinic. [phone]

## 2022-04-30 NOTE — Progress Notes (Signed)
Patient clinically stable post procedure L1 Ablation/biopsy as well as kyphoplasty per Dr Milford Cage , vitals stable pre and post procedure. Received Versed 3 mg along with Fentanyl 100 mcg IV for procedure. Denies complaints post procedure. Awake/alert and oriented post procedure. Report given to  [facility] post procedure / 332.

## 2022-04-30 NOTE — Progress Notes (Signed)
Referring Physician(s): Borders, Ivin Booty  Supervising Physician: Roanna Banning  Patient Status:  [facility] - In-pt  Chief Complaint:  S/p L1 biopsy, RFA and kyphoplasty of L1 compression fracture/osseous lesion   Subjective: Pt reports she is already feeling better with 0/10 pain. She has no complaints.   Allergies: Patient has no known allergies.  Medications: Prior to Admission medications   Medication Sig Start Date End Date Taking? Authorizing Provider  acetaminophen (TYLENOL) 500 MG tablet Take 500 mg by mouth every 6 (six) hours as needed for moderate pain, mild pain, fever or headache.   Yes [provider]  Ascorbic Acid (VITAMIN C) 1000 MG tablet Take 1,000 mg by mouth daily.     Yes [provider]  Docusate Calcium (STOOL SOFTENER PO) Take 1 capsule by mouth daily as needed (constipation).   Yes [provider]  folic acid (FOLVITE) 1 MG tablet Take 1 tablet (1 mg total) by mouth daily. Start 7 days before pemetrexed chemotherapy. Continue until 21 days after pemetrexed completed. 04/06/22  Yes Creig Hines, MD  gabapentin (NEURONTIN) 300 MG capsule Take 1 capsule (300 mg total) by mouth 3 (three) times daily. 03/23/22  Yes Borders, Daryl Eastern, NP  lidocaine-prilocaine (EMLA) cream Apply 1 Application topically as needed. 04/14/22  Yes Creig Hines, MD  melatonin 5 MG TABS Take 1 tablet (5 mg total) by mouth at bedtime as needed. 02/18/22 05/19/22 Yes Leeroy Bock, MD  metoprolol tartrate (LOPRESSOR) 25 MG tablet Take 1 tablet (25 mg total) by mouth 2 (two) times daily. 04/01/22  Yes Wieting, Richard, MD  Multiple Vitamin (MULTIVITAMIN) capsule Take 1 capsule by mouth daily.     Yes [provider]  omeprazole (PRILOSEC) 20 MG capsule Take 1 capsule (20 mg total) by mouth daily. 12/20/14  Yes Shelia Media, MD  ondansetron (ZOFRAN) 8 MG tablet Take 1 tablet (8 mg total) by mouth every 8 (eight) hours as needed for nausea or vomiting.  Start on the third day after carboplatin. 04/06/22  Yes Creig Hines, MD  oxyCODONE ER [facility]) 18 MG C12A Take 1 capsule by mouth every 12 (twelve) hours. 04/15/22  Yes Borders, Daryl Eastern, NP  prednisoLONE acetate (PRED MILD) 0.12 % ophthalmic suspension Place 1 drop into the right eye daily.   Yes [provider]  simvastatin (ZOCOR) 40 MG tablet TAKE 1 TABLET EVERY DAY  AT  6:00 PM 12/20/14  Yes Shelia Media, MD  apixaban (ELIQUIS) 5 MG TABS tablet Two tabs po twice a day for six days then one tab po twice a day afterwards 04/01/22   Alford Highland, MD  cyclobenzaprine (FLEXERIL) 5 MG tablet Take 1 tablet (5 mg total) by mouth 3 (three) times daily as needed for muscle spasms. 03/26/22   Borders, Daryl Eastern, NP  dexamethasone (DECADRON) 4 MG tablet Take 1 tab 2 times daily starting day before pemetrexed. Then take 2 tabs daily x 3 days starting day after carboplatin. Take with food. Patient not taking: Reported on 04/22/2022 04/06/22   Creig Hines, MD  LORazepam (ATIVAN) 0.5 MG tablet Take 1 tablet (0.5 mg total) by mouth every 8 (eight) hours as needed for anxiety. 03/30/22   Borders, Daryl Eastern, NP  Oxycodone HCl 20 MG TABS Take 0.5-1 tablets (10-20 mg total) by mouth every 4 (four) hours as needed. 04/30/22   Borders, Daryl Eastern, NP  polyethylene glycol (MIRALAX / GLYCOLAX) 17 g packet Take 17 g by mouth  daily. 02/18/22   Leeroy Bock, MD  prochlorperazine (COMPAZINE) 10 MG tablet Take 1 tablet (10 mg total) by mouth every 6 (six) hours as needed for nausea or vomiting. 03/11/22   Borders, Daryl Eastern, NP     Vital Signs: BP (!) 136/92   Pulse 100   Temp 97.7 F (36.5 C) (Oral)   Resp 20   Ht 5' (1.524 m)   Wt 115 lb (52.2 kg)   SpO2 96%   BMI 22.46 kg/m   Physical Exam Vitals reviewed.  Constitutional:      General: She is not in acute distress. Neurological:     Mental Status: She is alert and oriented to person, place, and time.     Comments: Alert, aware and  oriented X 3 Speech and comprehension is intact. Sensation normal and able to spontaneously move all 4 extremities.              Imaging: No results found.  Labs:  CBC: Recent Labs    03/31/22 0531 04/16/22 0829 04/22/22 0936 04/28/22 1018  WBC 12.1* 6.6 4.5 1.2*  HGB 10.5* 10.1* 10.3* 9.1*  HCT 35.5* 31.6* 31.7* 28.8*  PLT 223 405* 243 106*    COAGS: Recent Labs    04/30/22 1239  INR 1.1    BMP: Recent Labs    03/31/22 0531 04/16/22 0829 04/22/22 0936 04/28/22 1018  NA 134* 128* 128* 130*  K 4.5 4.6 4.5 3.7  CL 101 91* 93* 96*  CO2 26 31 25 26   GLUCOSE 122* 138* 160* 154*  BUN 26* 24* 18 11  CALCIUM 9.1 9.7 9.3 9.0  CREATININE 0.71 0.66 0.59 0.53  GFRNONAA >60 >60 >60 >60    LIVER FUNCTION TESTS: Recent Labs    02/24/22 1040 03/31/22 0531 04/16/22 0829  BILITOT 0.4 0.8 0.2*  AST 20 21 21   ALT 15 19 16   ALKPHOS 94 74 62  PROT 6.8 6.3* 7.2  ALBUMIN 3.4* 3.2* 3.5    Assessment and Plan:  Pt resting flat on stretcher with daughter at bedside. She is A&O.  She is in no distress.  Neurologically intact.   Bedrest for 2 hours post procedure.  Keep puncture sites clean and dry for 24 hours. Use walker at home to prevent falls.  Follow up as needed with IR.     Electronically Signed: 06/15/22, NP 04/30/2022, 4:24 PM   I spent a total of 25 Minutes at the the patient's bedside AND on the patient's hospital floor or unit, greater than 50% of which was counseling/coordinating care for s/p biopsy, RFA and kyphoplasty of L1 compression fracture/osseous lesion.

## 2022-04-30 NOTE — Telephone Encounter (Signed)
Receved request from pharmacy- they need Amber Glenn to send a new script with pt's diagnosis attached. I pended new script

## 2022-05-01 ENCOUNTER — Telehealth (HOSPITAL_COMMUNITY): Payer: Self-pay | Admitting: Interventional Radiology

## 2022-05-01 NOTE — Progress Notes (Signed)
Vascular and Interventional Radiology  Phone Note  Patient: Amber Glenn DOB: Sep 07, 1950 Medical Record Number: [id number] Note Date/Time: 05/01/22 8:44 AM   Diagnosis: Metastatic lung CA with symptomatic L1 pathologic fracture  I identified myself to the patient and conveyed my credentials to Ms.Amber Glenn She is joined in the call by her adult daughter, Amber Glenn  Assessment  Plan: 72 y.o. year old female POD 1 s/p L1 Bone Biopsy, Percutaneous RFA "Osteocool" and Balloon Kyphoplasty Vertebral Augmentation.  VIR reached out in courtesy follow-up.  Pt reports that they are doing well. Denies any discomfort.  Is already up and walking. She reports to feel "great". Reports normal bladder function. No concern at this time.     Follow up Pt to follow up with me in Clinic within 1 month post op. Pt has previously scheduled follow up with NSGY (05/06/22), Med Onc (05/07/22) and Palliative Medicine (05/11/22)   As part of this Telephone encounter, no in-person exam was conducted.  The patient was physically located in [place] or a state in which I am permitted to provide care. The encounter was reasonable and appropriate under the circumstances given the patient's presentation at the time.    Roanna Banning, MD Vascular and Interventional Radiology [facility]   Pager. [phone] Clinic. [phone]

## 2022-05-03 LAB — GLUCOSE, CAPILLARY: Glucose-Capillary: 122 mg/dL — ABNORMAL HIGH (ref 70–99)

## 2022-05-05 ENCOUNTER — Other Ambulatory Visit: Payer: Self-pay

## 2022-05-05 ENCOUNTER — Telehealth: Payer: Self-pay | Admitting: *Deleted

## 2022-05-05 DIAGNOSIS — Z981 Arthrodesis status: Secondary | ICD-10-CM

## 2022-05-05 NOTE — Telephone Encounter (Signed)
Per Angie Fava, RN - family was not able to pick up oxycodone b/c the 'dx code was not on this script.'  A new script with the dx codes were generated on 19'th and receipt confirmed by pharmacy noted. I reached out to pts pharmacy. The script is ready for pick-up. No further action is needed. I confirmed that the new script was rcvd.

## 2022-05-06 ENCOUNTER — Ambulatory Visit
Admission: RE | Admit: 2022-05-06 | Discharge: 2022-05-06 | Disposition: A | Discharge status: Home / Self Care | Payer: Medicare HMO | Source: Ambulatory Visit | Attending: Neurosurgery | Admitting: Neurosurgery

## 2022-05-06 ENCOUNTER — Encounter: Payer: Self-pay | Admitting: Neurosurgery

## 2022-05-06 ENCOUNTER — Ambulatory Visit (INDEPENDENT_AMBULATORY_CARE_PROVIDER_SITE_OTHER): Payer: Medicare HMO | Admitting: Neurosurgery

## 2022-05-06 ENCOUNTER — Ambulatory Visit
Admission: RE | Admit: 2022-05-06 | Discharge: 2022-05-06 | Disposition: A | Discharge status: Home / Self Care | Payer: Medicare HMO | Attending: Neurosurgery | Admitting: Neurosurgery

## 2022-05-06 VITALS — BP 136/66 | HR 125 | Wt 111.8 lb

## 2022-05-06 DIAGNOSIS — Z09 Encounter for follow-up examination after completed treatment for conditions other than malignant neoplasm: Secondary | ICD-10-CM

## 2022-05-06 DIAGNOSIS — Z981 Arthrodesis status: Secondary | ICD-10-CM

## 2022-05-06 DIAGNOSIS — M532X6 Spinal instabilities, lumbar region: Secondary | ICD-10-CM

## 2022-05-06 DIAGNOSIS — M8448XD Pathological fracture, other site, subsequent encounter for fracture with routine healing: Secondary | ICD-10-CM

## 2022-05-06 LAB — SURGICAL PATHOLOGY

## 2022-05-06 MED FILL — Fosaprepitant Dimeglumine For IV Infusion 150 MG (Base Eq): INTRAVENOUS | Qty: 5 | Status: AC

## 2022-05-06 MED FILL — Dexamethasone Sodium Phosphate Inj 100 MG/10ML: INTRAMUSCULAR | Qty: 1 | Status: AC

## 2022-05-06 NOTE — Progress Notes (Signed)
   REFERRING PHYSICIAN:  Lynnell Jude, Md [address] [place],  [place] [address]  DOS: PSF T11-L3 for pathologic L1 compression fracture on 02/15/22  HISTORY OF PRESENT ILLNESS: Amber Glenn is status post PSF T11-L3 for pathologic L1 compression fracture.  Her back pain is much better.  She had a fall on Sunday.  Her pain is around her left hip.  She is able to walk without trouble.  PHYSICAL EXAMINATION:  General: Patient is well developed, well nourished, calm, collected, and in no apparent distress.   NEUROLOGICAL:  General: In no acute distress.   Awake, alert, oriented to person, place, and time.  Pupils equal round and reactive to light.  Facial tone is symmetric.     Strength:            Side Iliopsoas Quads Hamstring PF DF EHL  R 5 5 5 5 5 5   L 5 5 5 5 5 5    Incisions c/d/i   ROS (Neurologic):  Negative except as noted above  IMAGING: No complications noted  ASSESSMENT/PLAN:  Amber Glenn is doing well s/p above surgery.     I think she has a soft tissue injury due to her fall.  I have encouraged her to use Voltaren gel.  If it does not improve over the next week or 2, we will consider further imaging intervention.  She will return for her second chemotherapy treatment in the oncology office next week.  I will see her back in 6 months with x-rays.  Meade Maw MD Department of neurosurgery

## 2022-05-07 ENCOUNTER — Inpatient Hospital Stay (HOSPITAL_BASED_OUTPATIENT_CLINIC_OR_DEPARTMENT_OTHER): Payer: Medicare HMO | Admitting: Oncology

## 2022-05-07 ENCOUNTER — Other Ambulatory Visit: Payer: Self-pay | Admitting: Interventional Radiology

## 2022-05-07 ENCOUNTER — Inpatient Hospital Stay: Payer: Medicare HMO

## 2022-05-07 ENCOUNTER — Encounter: Payer: Self-pay | Admitting: Oncology

## 2022-05-07 ENCOUNTER — Encounter: Payer: Self-pay | Admitting: *Deleted

## 2022-05-07 VITALS — BP 122/48 | HR 106 | Temp 98.7°F | Resp 16 | Ht 60.0 in | Wt 113.6 lb

## 2022-05-07 DIAGNOSIS — C3491 Malignant neoplasm of unspecified part of right bronchus or lung: Secondary | ICD-10-CM | POA: Diagnosis not present

## 2022-05-07 DIAGNOSIS — Z981 Arthrodesis status: Secondary | ICD-10-CM

## 2022-05-07 DIAGNOSIS — D508 Other iron deficiency anemias: Secondary | ICD-10-CM

## 2022-05-07 DIAGNOSIS — Z5111 Encounter for antineoplastic chemotherapy: Secondary | ICD-10-CM

## 2022-05-07 LAB — COMPREHENSIVE METABOLIC PANEL
ALT: 19 U/L (ref 0–44)
AST: 29 U/L (ref 15–41)
Albumin: 3.6 g/dL (ref 3.5–5.0)
Alkaline Phosphatase: 86 U/L (ref 38–126)
Anion gap: 9 (ref 5–15)
BUN: 25 mg/dL — ABNORMAL HIGH (ref 8–23)
CO2: 26 mmol/L (ref 22–32)
Calcium: 9.4 mg/dL (ref 8.9–10.3)
Chloride: 95 mmol/L — ABNORMAL LOW (ref 98–111)
Creatinine, Ser: 0.63 mg/dL (ref 0.44–1.00)
GFR, Estimated: >60 mL/min (ref >60)
Glucose, Bld: 204 mg/dL — ABNORMAL HIGH (ref 70–99)
Potassium: 4.1 mmol/L (ref 3.5–5.1)
Sodium: 130 mmol/L — ABNORMAL LOW (ref 135–145)
Total Bilirubin: 0.3 mg/dL (ref 0.3–1.2)
Total Protein: 6.9 g/dL (ref 6.5–8.1)

## 2022-05-07 LAB — CBC WITH DIFFERENTIAL/PLATELET
Abs Immature Granulocytes: 0.1 10*3/uL — ABNORMAL HIGH (ref 0.00–0.07)
Basophils Absolute: 0 10*3/uL (ref 0.0–0.1)
Basophils Relative: 1 %
Eosinophils Absolute: 0 10*3/uL (ref 0.0–0.5)
Eosinophils Relative: 0 %
HCT: 30.9 % — ABNORMAL LOW (ref 36.0–46.0)
Hemoglobin: 9.8 g/dL — ABNORMAL LOW (ref 12.0–15.0)
Immature Granulocytes: 3 %
Lymphocytes Relative: 20 %
Lymphs Abs: 0.8 10*3/uL (ref 0.7–4.0)
MCH: 26.7 pg (ref 26.0–34.0)
MCHC: 31.7 g/dL (ref 30.0–36.0)
MCV: 84.2 fL (ref 80.0–100.0)
Monocytes Absolute: 0.5 10*3/uL (ref 0.1–1.0)
Monocytes Relative: 12 %
Neutro Abs: 2.6 10*3/uL (ref 1.7–7.7)
Neutrophils Relative %: 64 %
Platelets: 401 10*3/uL — ABNORMAL HIGH (ref 150–400)
RBC: 3.67 MIL/uL — ABNORMAL LOW (ref 3.87–5.11)
RDW: 19.8 % — ABNORMAL HIGH (ref 11.5–15.5)
WBC: 4 10*3/uL (ref 4.0–10.5)
nRBC: 0 % (ref 0.0–0.2)

## 2022-05-07 MED ORDER — SODIUM CHLORIDE 0.9 % IV SOLN
200.0000 mg | Freq: Once | INTRAVENOUS | Status: AC
  Administered 2022-05-07: 200 mg via INTRAVENOUS
  Filled 2022-05-07: qty 8

## 2022-05-07 MED ORDER — SODIUM CHLORIDE 0.9 % IV SOLN
340.0000 mg | Freq: Once | INTRAVENOUS | Status: AC
  Administered 2022-05-07: 350 mg via INTRAVENOUS
  Filled 2022-05-07: qty 35

## 2022-05-07 MED ORDER — SODIUM CHLORIDE 0.9 % IV SOLN
Freq: Once | INTRAVENOUS | Status: DC
  Filled 2022-05-07: qty 250

## 2022-05-07 MED ORDER — PALONOSETRON HCL INJECTION 0.25 MG/5ML
0.2500 mg | Freq: Once | INTRAVENOUS | Status: AC
  Administered 2022-05-07: 0.25 mg via INTRAVENOUS
  Filled 2022-05-07: qty 5

## 2022-05-07 MED ORDER — SODIUM CHLORIDE 0.9 % IV SOLN
500.0000 mg/m2 | Freq: Once | INTRAVENOUS | Status: AC
  Administered 2022-05-07: 800 mg via INTRAVENOUS
  Filled 2022-05-07: qty 12

## 2022-05-07 MED ORDER — SODIUM CHLORIDE 0.9 % IV SOLN
Freq: Once | INTRAVENOUS | Status: AC
  Filled 2022-05-07: qty 250

## 2022-05-07 MED ORDER — SODIUM CHLORIDE 0.9 % IV SOLN
150.0000 mg | Freq: Once | INTRAVENOUS | Status: AC
  Administered 2022-05-07: 150 mg via INTRAVENOUS
  Filled 2022-05-07: qty 150

## 2022-05-07 MED ORDER — HEPARIN SOD (PORK) LOCK FLUSH 100 UNIT/ML IV SOLN
500.0000 [IU] | Freq: Once | INTRAVENOUS | Status: AC | PRN
  Administered 2022-05-07: 500 [IU]
  Filled 2022-05-07: qty 5

## 2022-05-07 MED ORDER — SODIUM CHLORIDE 0.9 % IV SOLN
200.0000 mg | INTRAVENOUS | Status: DC
  Administered 2022-05-07: 200 mg via INTRAVENOUS
  Filled 2022-05-07: qty 200

## 2022-05-07 MED ORDER — SODIUM CHLORIDE 0.9 % IV SOLN
10.0000 mg | Freq: Once | INTRAVENOUS | Status: AC
  Administered 2022-05-07: 10 mg via INTRAVENOUS
  Filled 2022-05-07: qty 10

## 2022-05-07 NOTE — Progress Notes (Signed)
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  She smoked about 2 packs of cigarettes per day but quit smoking in 2010.  She presented to the ER withSymptoms of significant back pain MRIs lumbar spine without contrast showed pathologic fracture of the L1 vertebral body with posterior bulging of the cortex resulting in mild spinal canal stenosis.  Diffuse T1 hypointense abnormality in the L1 vertebral body with involvement of bilateral pedicles and transverse processes.  CT chest abdomen and pelvis with contrast showed 1 cm left supraclavicular adenopathy.  1 cm right subcarinal lymph node.  Necrotic 3.1 x 2.4 x 2.9 cm anterior mediastinal mass in the left prevascular region with mild mass effect on the brachiocephalic vein.  Possible phrenic nerve involvement.  Mild acute sigmoid colon diverticulitis.    Patient had biopsy of the left supraclavicular lymph node which was consistent with adenocarcinoma of lung primary.  NGS testing did not show any actionable mutations.  K-ras G12 D mutation detected.  MSI stable.  Patient underwent percutaneous fixation of the L1 vertebral body.  She completed palliative radiation treatment to her spine    Interval history-tolerated cycle 1 of chemotherapy well without any significant nausea vomiting or diarrhea.  Back pain is presently well-controlled.  Appetite isFair and patient has not lost any further weight  ECOG PS- 1 Pain  scale- 2 Opioid associated constipation- no  Review of systems- Review of Systems  Constitutional:  Positive for malaise/fatigue. Negative for chills, fever and weight loss.  HENT:  Negative for congestion, ear discharge and nosebleeds.   Eyes:  Negative for blurred vision.  Respiratory:  Negative for cough, hemoptysis, sputum production, shortness of breath and wheezing.   Cardiovascular:  Negative for chest pain, palpitations, orthopnea and claudication.  Gastrointestinal:  Negative for abdominal pain, blood in stool, constipation, diarrhea, heartburn, melena, nausea and vomiting.  Genitourinary:  Negative for dysuria, flank pain, frequency, hematuria and urgency.  Musculoskeletal:  Negative for back pain, joint pain and myalgias.  Skin:  Negative for rash.  Neurological:  Negative for dizziness, tingling, focal weakness, seizures, weakness and headaches.  Endo/Heme/Allergies:  Does not bruise/bleed easily.  Psychiatric/Behavioral:  Negative for depression and suicidal ideas. The patient does not have insomnia.       No Known Allergies   Past Medical History:  Diagnosis Date   Arthritis    hands and feet   Cancer associated pain    Cataract    right   Complication of anesthesia    sometimes have a hard time waking up   Diabetes mellitus    Borderline Diabetes   GERD (gastroesophageal reflux disease)    HTN (hypertension)    Hyperlipidemia    Motion sickness    Post corneal transplant    right     Past Surgical History:  Procedure Laterality Date   APPLICATION OF INTRAOPERATIVE CT SCAN  02/15/2022   Procedure: APPLICATION OF INTRAOPERATIVE CT SCAN;  Surgeon: Meade Maw, MD;  Location: [facility] ORS;  Service:  Neurosurgery;;   BASAL CELL CARCINOMA EXCISION     Dr. Manley Mason   CARPAL TUNNEL RELEASE     right   COLONOSCOPY WITH PROPOFOL N/A 12/03/2015   Procedure: COLONOSCOPY WITH PROPOFOL;  Surgeon: Manya Silvas, MD;  Location: [facility] ENDOSCOPY;  Service: Endoscopy;   Laterality: N/A;   COLONOSCOPY WITH PROPOFOL N/A 02/22/2020   Procedure: COLONOSCOPY WITH PROPOFOL;  Surgeon: Lesly Rubenstein, MD;  Location: [facility] ENDOSCOPY;  Service: Endoscopy;  Laterality: N/A;   ESOPHAGOGASTRODUODENOSCOPY (EGD) WITH PROPOFOL N/A 02/22/2020   Procedure: ESOPHAGOGASTRODUODENOSCOPY (EGD) WITH PROPOFOL;  Surgeon: Lesly Rubenstein, MD;  Location: [facility] ENDOSCOPY;  Service: Endoscopy;  Laterality: N/A;   HAMMER TOE SURGERY Right 05/23/2019   Procedure: HAMMER TOE CORRECTION;  Surgeon: Samara Deist, DPM;  Location: [place];  Service: Podiatry;  Laterality: Right;  Diabetic   IR BONE TUMOR(S)RF ABLATION  04/30/2022   IR IMAGING GUIDED PORT INSERTION  04/14/2022   IR KYPHO LUMBAR INC FX REDUCE BONE BX UNI/BIL CANNULATION INC/IMAGING  04/30/2022   IR RADIOLOGIST EVAL & MGMT  03/24/2022   OSTECTOMY Right 05/23/2019   Procedure: DOUBLE OSTEOTOMY RIGHT;  Surgeon: Samara Deist, DPM;  Location: [place];  Service: Podiatry;  Laterality: Right;   SKIN CANCER EXCISION      Social History   Socioeconomic History   Marital status: Widowed    Spouse name: Not on file   Number of children: Not on file   Years of education: Not on file   Highest education level: Not on file  Occupational History    Employer: [employer]    Comment: Currently not employed  Tobacco Use   Smoking status: Former    Types: Cigarettes    Quit date: 01/13/2009    Years since quitting: 13.3   Smokeless tobacco: Never  Vaping Use   Vaping Use: Never used  Substance and Sexual Activity   Alcohol use: No   Drug use: No   Sexual activity: Not Currently    Birth control/protection: Post-menopausal  Other Topics Concern   Not on file  Social History Narrative   Not on file   Social Determinants of Health   Financial Resource Strain: Not on file  Food Insecurity: No Food Insecurity (03/31/2022)   Hunger Vital Sign    Worried About Running Out of Food in the Last Year: Never  true    Ran Out of Food in the Last Year: Never true  Transportation Needs: No Transportation Needs (03/31/2022)   PRAPARE - Hydrologist (Medical): No    Lack of Transportation (Non-Medical): No  Physical Activity: Not on file  Stress: Not on file  Social Connections: Not on file  Intimate Partner Violence: Not At Risk (02/10/2022)   Humiliation, Afraid, Rape, and Kick questionnaire    Fear of Current or Ex-Partner: No    Emotionally Abused: No    Physically Abused: No    Sexually Abused: No    Family History  Problem Relation Age of Onset   Heart disease Mother    Heart disease Father    Heart disease Maternal Grandmother    Heart disease Maternal Grandfather    Breast cancer Sister 10     Current Outpatient Medications:    acetaminophen (TYLENOL) 500 MG tablet, Take 500 mg by mouth every 6 (six) hours as needed for moderate pain, mild pain, fever or headache., Disp: , Rfl:    apixaban (ELIQUIS) 5 MG TABS tablet, Two tabs  po twice a day for six days then one tab po twice a day afterwards, Disp: 72 tablet, Rfl: 0   Ascorbic Acid (VITAMIN C) 1000 MG tablet, Take 1,000 mg by mouth daily.  , Disp: , Rfl:    cyclobenzaprine (FLEXERIL) 5 MG tablet, Take 1 tablet (5 mg total) by mouth 3 (three) times daily as needed for muscle spasms., Disp: 30 tablet, Rfl: 0   Docusate Calcium (STOOL SOFTENER PO), Take 1 capsule by mouth daily as needed (constipation)., Disp: , Rfl:    folic acid (FOLVITE) 1 MG tablet, Take 1 tablet (1 mg total) by mouth daily. Start 7 days before pemetrexed chemotherapy. Continue until 21 days after pemetrexed completed., Disp: 100 tablet, Rfl: 3   gabapentin (NEURONTIN) 300 MG capsule, Take 1 capsule (300 mg total) by mouth 3 (three) times daily., Disp: 90 capsule, Rfl: 2   lidocaine-prilocaine (EMLA) cream, Apply 1 Application topically as needed., Disp: 30 g, Rfl: 2   melatonin 5 MG TABS, Take 1 tablet (5 mg total) by mouth at bedtime as  needed., Disp: 90 tablet, Rfl: 0   metoprolol tartrate (LOPRESSOR) 25 MG tablet, Take 1 tablet (25 mg total) by mouth 2 (two) times daily., Disp: 60 tablet, Rfl: 0   Multiple Vitamin (MULTIVITAMIN) capsule, Take 1 capsule by mouth daily.  , Disp: , Rfl:    omeprazole (PRILOSEC) 20 MG capsule, Take 1 capsule (20 mg total) by mouth daily., Disp: 90 capsule, Rfl: 3   oxyCODONE ER (XTAMPZA ER) 18 MG C12A, Take 1 capsule by mouth every 12 (twelve) hours., Disp: 60 capsule, Rfl: 0   Oxycodone HCl 20 MG TABS, Take 0.5-1 tablets (10-20 mg total) by mouth every 4 (four) hours as needed., Disp: 60 tablet, Rfl: 0   polyethylene glycol (MIRALAX / GLYCOLAX) 17 g packet, Take 17 g by mouth daily., Disp: 14 each, Rfl: 0   prednisoLONE acetate (PRED MILD) 0.12 % ophthalmic suspension, Place 1 drop into both eyes daily., Disp: , Rfl:    simvastatin (ZOCOR) 40 MG tablet, TAKE 1 TABLET EVERY DAY  AT  6:00 PM, Disp: 90 tablet, Rfl: 3   LORazepam (ATIVAN) 0.5 MG tablet, Take 1 tablet (0.5 mg total) by mouth every 8 (eight) hours as needed for anxiety. (Patient not taking: Reported on 05/07/2022), Disp: 30 tablet, Rfl: 0   ondansetron (ZOFRAN) 8 MG tablet, Take 1 tablet (8 mg total) by mouth every 8 (eight) hours as needed for nausea or vomiting. Start on the third day after carboplatin. (Patient not taking: Reported on 05/07/2022), Disp: 30 tablet, Rfl: 1   prochlorperazine (COMPAZINE) 10 MG tablet, Take 1 tablet (10 mg total) by mouth every 6 (six) hours as needed for nausea or vomiting. (Patient not taking: Reported on 05/07/2022), Disp: 30 tablet, Rfl: 2 No current facility-administered medications for this visit.  Facility-Administered Medications Ordered in Other Visits:    0.9 %  sodium chloride infusion, , Intravenous, Once, Sindy Guadeloupe, MD   CARBOplatin (PARAPLATIN) 350 mg in sodium chloride 0.9 % 100 mL chemo infusion, 350 mg, Intravenous, Once, Sindy Guadeloupe, MD   heparin lock flush 100 unit/mL, 500 Units,  Intracatheter, Once PRN, Sindy Guadeloupe, MD   iron sucrose (VENOFER) 200 mg in sodium chloride 0.9 % 100 mL IVPB, 200 mg, Intravenous, Weekly, Borders, Kirt Boys, NP, Last Rate: 440 mL/hr at 05/07/22 1123, 200 mg at 05/07/22 1123  Physical exam:  Vitals:   05/07/22 1013  BP: (!) 122/48  Pulse: Marland Kitchen)  106  Resp: 16  Temp: 98.7 F (37.1 C)  TempSrc: Oral  Weight: 113 lb 9.6 oz (51.5 kg)  Height: 5' (1.524 m)   Physical Exam Cardiovascular:     Rate and Rhythm: Regular rhythm. Tachycardia present.     Heart sounds: Normal heart sounds.  Pulmonary:     Effort: Pulmonary effort is normal.     Breath sounds: Normal breath sounds.  Abdominal:     General: Bowel sounds are normal.     Palpations: Abdomen is soft.  Skin:    General: Skin is warm and dry.  Neurological:     Mental Status: She is alert and oriented to person, place, and time.         Latest Ref Rng & Units 05/07/2022    9:03 AM  CMP  Glucose 70 - 99 mg/dL 204   BUN 8 - 23 mg/dL 25   Creatinine 0.44 - 1.00 mg/dL 0.63   Sodium 135 - 145 mmol/L 130   Potassium 3.5 - 5.1 mmol/L 4.1   Chloride 98 - 111 mmol/L 95   CO2 22 - 32 mmol/L 26   Calcium 8.9 - 10.3 mg/dL 9.4   Total Protein 6.5 - 8.1 g/dL 6.9   Total Bilirubin 0.3 - 1.2 mg/dL 0.3   Alkaline Phos 38 - 126 U/L 86   AST 15 - 41 U/L 29   ALT 0 - 44 U/L 19       Latest Ref Rng & Units 05/07/2022    9:03 AM  CBC  WBC 4.0 - 10.5 K/uL 4.0   Hemoglobin 12.0 - 15.0 g/dL 9.8   Hematocrit 36.0 - 46.0 % 30.9   Platelets 150 - 400 K/uL 401     No images are attached to the encounter.  CT GUIDED NEEDLE PLACEMENT  Result Date: 04/30/2022 CLINICAL DATA:  Briefly, 72 year old female with a history of metastatic lung cancer and symptomatic L1 pathologic vertebral body fracture. EXAM: CT-ASSISTED, FLUOROSCOPIC-GUIDED L1 VERTEBRAL BODY BIOPSY, RADIOFREQUENCY (OsteoCool) ABLATION and KYPHOPLASTY AUGMENTATION COMPARISON:  L-spine XRs 03/30/2022.  CT L-spine, 03/16/2022.  MEDICATIONS: Ancef 2 gm IV; The antibiotic was administered in an appropriate time interval prior to needle puncture of the skin. 0.5 % bupivacaine IM within the access level paraspinal muscles. ANESTHESIA/SEDATION: Moderate (conscious) sedation was employed during this procedure. A total of Versed 3 mg and Fentanyl 100 mcg was administered intravenously. Moderate Sedation Time: 72 minutes. The patient's level of consciousness and vital signs were monitored continuously by radiology nursing throughout the procedure under my direct supervision. FLUOROSCOPY TIME:  Fluoroscopic dose; 239 mGy CT dose in mGy was not provided. COMPLICATIONS: None immediate. TECHNIQUE: RADIATION DOSE REDUCTION: This exam was performed according to the departmental dose-optimization program which includes automated exposure control, adjustment of the mA and/or kV according to patient size and/or use of iterative reconstruction technique. Informed written consent was obtained from the the patient and/or patient's representative after a thorough discussion of the procedural risks, benefits and alternatives. All questions were addressed. Maximal Sterile Barrier Technique was utilized including caps, mask, sterile gowns, sterile gloves, sterile drape, hand hygiene and skin antiseptic. A timeout was performed prior to the initiation of the procedure. The patient was positioned prone on the CT table and a limited CT was performed for procedural planning demonstrating severely demineralized L1 vertebral body. The procedure was planned. The operative site was prepped and draped in the usual sterile fashion. Appropriate trajectory was confirmed with a 22 gauge spinal needle after the  adjacent tissues were anesthetized with 1% lidocaine with epinephrine superficially then deeper with 0.5% bupivacaine. Under intermittent CT guidance, the Kyphon trocar needles was advanced into the LEFT and RIGHT pedicles at L1 vertebral body. Appropriate positioning  was confirmed and a bone biopsy sample was obtained with a core needle biopsy device. Using fluoroscopy, once the Kyphon trocar needles were passed the posterior one-third of the vertebral body, the osteo drill was advanced to the anterior third of the vertebral body. The osteo drill were retracted. Through the working cannulas, 15 mm Osetocool RF ablation probes were inserted and positioned under fluoroscopic guidance. With both OsteoCool ablation probes in place, the ablation was performed for 7.5 minutes. Attention was now paid towards the kyphoplasty portion of the procedure. A Kyphon inflatable bone tamp 15 x 3 was advanced through both working cannulas and positioned with the distal marker approximately 5 mm from the anterior aspect of the cortex. Appropriate positioning was confirmed on the AP projection. At this time, the balloon was expanded using contrast via a Kyphon inflation syringe device via micro tubing. Inflations were continued under direct fluoroscopic guidance. At this time, methylmethacrylate mixture was reconstituted in the Kyphon bone mixing device system. This was then loaded into the delivery mechanism, attached to Kyphon bone fillers. The balloons were deflated and removed followed by the instillation of methylmethacrylate mixture with excellent filling in the AP and lateral projections. The working cannulae and the bone filler were then retrieved and removed. Multiple spot radiographic images were obtained in various obliquities. Hemostasis was achieved with manual compression. A limited postprocedural CT was negative for hemorrhage or additional complication. A dressing was placed. The patient tolerated the procedure well without immediate postprocedural complication. FINDINGS: *Severely demineralized L1 vertebral body, requiring CT for access. *L1 vertebral body biopsy, with samples submitted in formalin. *Adequate cement filling of the L1 vertebral body on both the AP and lateral  projections. *No extravasation was noted in the disk spaces or posteriorly into the spinal canal or along the pedicular access. No significant epidural venous contamination was present. IMPRESSION: Successful CT-assisted and fluoroscopic-guided L1 vertebral body biopsy, radiofrequency "OsteoCool" ablation and bi-pedicular cement augmentation with balloon kyphoplasty, as above. PLAN: The patient will return to Vascular Interventional Radiology (VIR) for clinic follow-up in 4 weeks. Michaelle Birks, MD [facility] Radiology Electronically Signed   By: Michaelle Birks M.D.   On: 04/30/2022 17:36   IR KYPHO LUMBAR INC FX REDUCE BONE BX UNI/BIL CANNULATION INC/IMAGING  Result Date: 04/30/2022 CLINICAL DATA:  Briefly, 72 year old female with a history of metastatic lung cancer and symptomatic L1 pathologic vertebral body fracture. EXAM: CT-ASSISTED, FLUOROSCOPIC-GUIDED L1 VERTEBRAL BODY BIOPSY, RADIOFREQUENCY (OsteoCool) ABLATION and KYPHOPLASTY AUGMENTATION COMPARISON:  L-spine XRs 03/30/2022.  CT L-spine, 03/16/2022. MEDICATIONS: Ancef 2 gm IV; The antibiotic was administered in an appropriate time interval prior to needle puncture of the skin. 0.5 % bupivacaine IM within the access level paraspinal muscles. ANESTHESIA/SEDATION: Moderate (conscious) sedation was employed during this procedure. A total of Versed 3 mg and Fentanyl 100 mcg was administered intravenously. Moderate Sedation Time: 72 minutes. The patient's level of consciousness and vital signs were monitored continuously by radiology nursing throughout the procedure under my direct supervision. FLUOROSCOPY TIME:  Fluoroscopic dose; 239 mGy CT dose in mGy was not provided. COMPLICATIONS: None immediate. TECHNIQUE: RADIATION DOSE REDUCTION: This exam was performed according to the departmental dose-optimization program which includes automated exposure control, adjustment of the mA and/or kV according to patient  size  and/or use of iterative reconstruction technique. Informed written consent was obtained from the the patient and/or patient's representative after a thorough discussion of the procedural risks, benefits and alternatives. All questions were addressed. Maximal Sterile Barrier Technique was utilized including caps, mask, sterile gowns, sterile gloves, sterile drape, hand hygiene and skin antiseptic. A timeout was performed prior to the initiation of the procedure. The patient was positioned prone on the CT table and a limited CT was performed for procedural planning demonstrating severely demineralized L1 vertebral body. The procedure was planned. The operative site was prepped and draped in the usual sterile fashion. Appropriate trajectory was confirmed with a 22 gauge spinal needle after the adjacent tissues were anesthetized with 1% lidocaine with epinephrine superficially then deeper with 0.5% bupivacaine. Under intermittent CT guidance, the Kyphon trocar needles was advanced into the LEFT and RIGHT pedicles at L1 vertebral body. Appropriate positioning was confirmed and a bone biopsy sample was obtained with a core needle biopsy device. Using fluoroscopy, once the Kyphon trocar needles were passed the posterior one-third of the vertebral body, the osteo drill was advanced to the anterior third of the vertebral body. The osteo drill were retracted. Through the working cannulas, 15 mm Osetocool RF ablation probes were inserted and positioned under fluoroscopic guidance. With both OsteoCool ablation probes in place, the ablation was performed for 7.5 minutes. Attention was now paid towards the kyphoplasty portion of the procedure. A Kyphon inflatable bone tamp 15 x 3 was advanced through both working cannulas and positioned with the distal marker approximately 5 mm from the anterior aspect of the cortex. Appropriate positioning was confirmed on the AP projection. At this time, the balloon was expanded using  contrast via a Kyphon inflation syringe device via micro tubing. Inflations were continued under direct fluoroscopic guidance. At this time, methylmethacrylate mixture was reconstituted in the Kyphon bone mixing device system. This was then loaded into the delivery mechanism, attached to Kyphon bone fillers. The balloons were deflated and removed followed by the instillation of methylmethacrylate mixture with excellent filling in the AP and lateral projections. The working cannulae and the bone filler were then retrieved and removed. Multiple spot radiographic images were obtained in various obliquities. Hemostasis was achieved with manual compression. A limited postprocedural CT was negative for hemorrhage or additional complication. A dressing was placed. The patient tolerated the procedure well without immediate postprocedural complication. FINDINGS: *Severely demineralized L1 vertebral body, requiring CT for access. *L1 vertebral body biopsy, with samples submitted in formalin. *Adequate cement filling of the L1 vertebral body on both the AP and lateral projections. *No extravasation was noted in the disk spaces or posteriorly into the spinal canal or along the pedicular access. No significant epidural venous contamination was present. IMPRESSION: Successful CT-assisted and fluoroscopic-guided L1 vertebral body biopsy, radiofrequency "OsteoCool" ablation and bi-pedicular cement augmentation with balloon kyphoplasty, as above. PLAN: The patient will return to Vascular Interventional Radiology (VIR) for clinic follow-up in 4 weeks. Michaelle Birks, MD [facility] Radiology Electronically Signed   By: Michaelle Birks M.D.   On: 04/30/2022 17:36   IR Bone Tumor(s)RF Ablation  Result Date: 04/30/2022 CLINICAL DATA:  Briefly, 72 year old female with a history of metastatic lung cancer and symptomatic L1 pathologic vertebral body fracture. EXAM: CT-ASSISTED,  FLUOROSCOPIC-GUIDED L1 VERTEBRAL BODY BIOPSY, RADIOFREQUENCY (OsteoCool) ABLATION and KYPHOPLASTY AUGMENTATION COMPARISON:  L-spine XRs 03/30/2022.  CT L-spine, 03/16/2022. MEDICATIONS: Ancef 2 gm IV; The antibiotic was administered in an appropriate time interval prior to needle  puncture of the skin. 0.5 % bupivacaine IM within the access level paraspinal muscles. ANESTHESIA/SEDATION: Moderate (conscious) sedation was employed during this procedure. A total of Versed 3 mg and Fentanyl 100 mcg was administered intravenously. Moderate Sedation Time: 72 minutes. The patient's level of consciousness and vital signs were monitored continuously by radiology nursing throughout the procedure under my direct supervision. FLUOROSCOPY TIME:  Fluoroscopic dose; 239 mGy CT dose in mGy was not provided. COMPLICATIONS: None immediate. TECHNIQUE: RADIATION DOSE REDUCTION: This exam was performed according to the departmental dose-optimization program which includes automated exposure control, adjustment of the mA and/or kV according to patient size and/or use of iterative reconstruction technique. Informed written consent was obtained from the the patient and/or patient's representative after a thorough discussion of the procedural risks, benefits and alternatives. All questions were addressed. Maximal Sterile Barrier Technique was utilized including caps, mask, sterile gowns, sterile gloves, sterile drape, hand hygiene and skin antiseptic. A timeout was performed prior to the initiation of the procedure. The patient was positioned prone on the CT table and a limited CT was performed for procedural planning demonstrating severely demineralized L1 vertebral body. The procedure was planned. The operative site was prepped and draped in the usual sterile fashion. Appropriate trajectory was confirmed with a 22 gauge spinal needle after the adjacent tissues were anesthetized with 1% lidocaine with epinephrine superficially then deeper  with 0.5% bupivacaine. Under intermittent CT guidance, the Kyphon trocar needles was advanced into the LEFT and RIGHT pedicles at L1 vertebral body. Appropriate positioning was confirmed and a bone biopsy sample was obtained with a core needle biopsy device. Using fluoroscopy, once the Kyphon trocar needles were passed the posterior one-third of the vertebral body, the osteo drill was advanced to the anterior third of the vertebral body. The osteo drill were retracted. Through the working cannulas, 15 mm Osetocool RF ablation probes were inserted and positioned under fluoroscopic guidance. With both OsteoCool ablation probes in place, the ablation was performed for 7.5 minutes. Attention was now paid towards the kyphoplasty portion of the procedure. A Kyphon inflatable bone tamp 15 x 3 was advanced through both working cannulas and positioned with the distal marker approximately 5 mm from the anterior aspect of the cortex. Appropriate positioning was confirmed on the AP projection. At this time, the balloon was expanded using contrast via a Kyphon inflation syringe device via micro tubing. Inflations were continued under direct fluoroscopic guidance. At this time, methylmethacrylate mixture was reconstituted in the Kyphon bone mixing device system. This was then loaded into the delivery mechanism, attached to Kyphon bone fillers. The balloons were deflated and removed followed by the instillation of methylmethacrylate mixture with excellent filling in the AP and lateral projections. The working cannulae and the bone filler were then retrieved and removed. Multiple spot radiographic images were obtained in various obliquities. Hemostasis was achieved with manual compression. A limited postprocedural CT was negative for hemorrhage or additional complication. A dressing was placed. The patient tolerated the procedure well without immediate postprocedural complication. FINDINGS: *Severely demineralized L1 vertebral  body, requiring CT for access. *L1 vertebral body biopsy, with samples submitted in formalin. *Adequate cement filling of the L1 vertebral body on both the AP and lateral projections. *No extravasation was noted in the disk spaces or posteriorly into the spinal canal or along the pedicular access. No significant epidural venous contamination was present. IMPRESSION: Successful CT-assisted and fluoroscopic-guided L1 vertebral body biopsy, radiofrequency "OsteoCool" ablation and bi-pedicular cement augmentation with balloon kyphoplasty, as above. PLAN:  The patient will return to [facility] ([facility]) for clinic follow-up in 4 weeks. Michaelle Birks, MD [facility] Radiology Electronically Signed   By: Michaelle Birks M.D.   On: 04/30/2022 17:36   IR IMAGING GUIDED PORT INSERTION  Result Date: 04/14/2022 INDICATION: non small cell lung cancer, need port for chemo EXAM: Chest port placement using ultrasound and fluoroscopic guidance MEDICATIONS: Documented in the EMR ANESTHESIA/SEDATION: Moderate (conscious) sedation was employed during this procedure. A total of Versed 2 mg and Fentanyl 100 mcg was administered intravenously. Moderate Sedation Time: 22 minutes. The patient's level of consciousness and vital signs were monitored continuously by radiology nursing throughout the procedure under my direct supervision. FLUOROSCOPY TIME:  Fluoroscopy Time: 0.3 minutes (1 mGy) COMPLICATIONS: None immediate. PROCEDURE: Informed written consent was obtained from the patient after a thorough discussion of the procedural risks, benefits and alternatives. All questions were addressed. Maximal Sterile Barrier Technique was utilized including caps, mask, sterile gowns, sterile gloves, sterile drape, hand hygiene and skin antiseptic. A timeout was performed prior to the initiation of the procedure. The patient was placed supine on the exam table. The right neck and chest  was prepped and draped in the standard sterile fashion. A preliminary ultrasound of the right neck was performed and demonstrates a patent right internal jugular vein. A permanent ultrasound image was stored in the electronic medical record. The overlying skin was anesthetized with 1% Lidocaine. Using ultrasound guidance, access was obtained into the right internal jugular vein using a 21 gauge micropuncture set. A wire was advanced into the SVC, a short incision was made at the puncture site, and serial dilatation performed. Next, in an ipsilateral infraclavicular location, an incision was made at the site of the subcutaneous reservoir. Blunt dissection was used to open a pocket to contain the reservoir. A subcutaneous tunnel was then created from the port site to the puncture site. A(n) 8 Fr single lumen catheter was advanced through the tunnel. The catheter was attached to the port and this was placed in the subcutaneous pocket. Under fluoroscopic guidance, a peel away sheath was placed, and the catheter was trimmed to the appropriate length and was advanced into the central veins. The catheter length is 23 cm. The tip of the catheter lies near the superior cavoatrial junction. The port flushes and aspirates appropriately. The port was flushed and locked with heparinized saline. The port pocket was closed in 2 layers using 3-0 and 4-0 Vicryl/absorbable suture. Dermabond was also applied to both incisions. The patient tolerated the procedure well and was transferred to recovery in stable condition. IMPRESSION: Successful placement of a right sided chest port via the right internal jugular vein. The port is ready for immediate use. Electronically Signed   By: Albin Felling M.D.   On: 04/14/2022 15:37     Assessment and plan- Patient is a 72 y.o. female with metastatic lung cancer with bone metastases here for on treatment assessment prior to cycle 2 of carbo Alimta Keytruda chemotherapy  Counts okay to  proceed with cycle 2 of carbo Alimta Keytruda chemotherapy today.  I will see her back in 3 weeks for cycle 3.  Plan to repeat scans after 4 cycles.  Iron deficiency anemia: Will receive dose 3 of Venofer today  Neoplasm related pain: Currently wellControlled with oxycodone and Xtampza.  Segmental pulmonary embolism: On Eliquis  Bone metastases: Discussed risks and benefits of Zometa including all but not limited to possible risk of osteonecrosis of  the jaw.  Dental clearance has been obtained.  She will start Zometa with next cycle  Sinus tachycardia: Will receive 1 L of IV fluids today   Visit Diagnosis 1. Primary malignant neoplasm of right lung metastatic to other site [facility])   2. Encounter for antineoplastic chemotherapy      Dr. Randa Evens, MD, MPH [facility] at [facility] [phone] 05/07/2022 12:59 PM

## 2022-05-07 NOTE — Patient Instructions (Signed)
Amber Glenn  Discharge Instructions: Thank you for choosing [facility] to provide your oncology and hematology care.  If you have a lab appointment with the [facility], please go directly to the [facility] and check in at the registration area.  Wear comfortable clothing and clothing appropriate for easy access to any Portacath or PICC line.   We strive to give you quality time with your provider. You may need to reschedule your appointment if you arrive late (15 or more minutes).  Arriving late affects you and other patients whose appointments are after yours.  Also, if you miss three or more appointments without notifying the office, you may be dismissed from the clinic at the provider's discretion.      For prescription refill requests, have your pharmacy contact our office and allow 72 hours for refills to be completed.    Today you received the following chemotherapy and/or immunotherapy agents KEYTRUDA,, ALIMTA, CARBOPLATIN, VENOFER      To help prevent nausea and vomiting after your treatment, we encourage you to take your nausea medication as directed.  BELOW ARE SYMPTOMS THAT SHOULD BE REPORTED IMMEDIATELY: *FEVER GREATER THAN 100.4 F (38 C) OR HIGHER *CHILLS OR SWEATING *NAUSEA AND VOMITING THAT IS NOT CONTROLLED WITH YOUR NAUSEA MEDICATION *UNUSUAL SHORTNESS OF BREATH *UNUSUAL BRUISING OR BLEEDING *URINARY PROBLEMS (pain or burning when urinating, or frequent urination) *BOWEL PROBLEMS (unusual diarrhea, constipation, pain near the anus) TENDERNESS IN MOUTH AND THROAT WITH OR WITHOUT PRESENCE OF ULCERS (sore throat, sores in mouth, or a toothache) UNUSUAL RASH, SWELLING OR PAIN  UNUSUAL VAGINAL DISCHARGE OR ITCHING   Items with * indicate a potential emergency and should be followed up as soon as possible or go to the Emergency Department if any problems should occur.  Please show the CHEMOTHERAPY ALERT CARD or  IMMUNOTHERAPY ALERT CARD at check-in to the Emergency Department and triage nurse.  Should you have questions after your visit or need to cancel or reschedule your appointment, please contact [facility]  [phone] and follow the prompts.  Office hours are 8:00 a.m. to 4:30 p.m. Monday - Friday. Please note that voicemails left after 4:00 p.m. may not be returned until the following business day.  We are closed weekends and major holidays. You have access to a nurse at all times for urgent questions. Please call the main number to the clinic [phone] and follow the prompts.  For any non-urgent questions, you may also contact your provider using MyChart. We now offer e-Visits for anyone 43 and older to request care online for non-urgent symptoms. For details visit [email or web address].   Also download the MyChart app! Go to the app store, search "MyChart", open the app, select [facility], and log in with your MyChart username and password.  Pembrolizumab Injection What is this medication? PEMBROLIZUMAB (PEM broe LIZ ue mab) treats some types of cancer. It works by helping your immune system slow or stop the spread of cancer cells. It is a monoclonal antibody. This medicine may be used for other purposes; ask your health care provider or pharmacist if you have questions. COMMON BRAND NAME(S): Keytruda What should I tell my care team before I take this medication? They need to know if you have any of these conditions: Allogeneic stem cell transplant (uses someone else's stem cells) Autoimmune diseases, such as Crohn disease, ulcerative colitis, lupus History of chest radiation Nervous system problems, such as Guillain-Barre  syndrome, myasthenia gravis Organ transplant An unusual or allergic reaction to pembrolizumab, other medications, foods, dyes, or preservatives Pregnant or trying to get pregnant Breast-feeding How should I use this  medication? This medication is injected into a vein. It is given by your care team in a hospital or clinic setting. A special MedGuide will be given to you before each treatment. Be sure to read this information carefully each time. Talk to your care team about the use of this medication in children. While it may be prescribed for children as young as 6 months for selected conditions, precautions do apply. Overdosage: If you think you have taken too much of this medicine contact a poison control center or emergency room at once. NOTE: This medicine is only for you. Do not share this medicine with others. What if I miss a dose? Keep appointments for follow-up doses. It is important not to miss your dose. Call your care team if you are unable to keep an appointment. What may interact with this medication? Interactions have not been studied. This list may not describe all possible interactions. Give your health care provider a list of all the medicines, herbs, non-prescription drugs, or dietary supplements you use. Also tell them if you smoke, drink alcohol, or use illegal drugs. Some items may interact with your medicine. What should I watch for while using this medication? Your condition will be monitored carefully while you are receiving this medication. You may need blood work while taking this medication. This medication may cause serious skin reactions. They can happen weeks to months after starting the medication. Contact your care team right away if you notice fevers or flu-like symptoms with a rash. The rash may be red or purple and then turn into blisters or peeling of the skin. You may also notice a red rash with swelling of the face, lips, or lymph nodes in your neck or under your arms. Tell your care team right away if you have any change in your eyesight. Talk to your care team if you may be pregnant. Serious birth defects can occur if you take this medication during pregnancy and for 4  months after the last dose. You will need a negative pregnancy test before starting this medication. Contraception is recommended while taking this medication and for 4 months after the last dose. Your care team can help you find the option that works for you. Do not breastfeed while taking this medication and for 4 months after the last dose. What side effects may I notice from receiving this medication? Side effects that you should report to your care team as soon as possible: Allergic reactions--skin rash, itching, hives, swelling of the face, lips, tongue, or throat Dry cough, shortness of breath or trouble breathing Eye pain, redness, irritation, or discharge with blurry or decreased vision Heart muscle inflammation--unusual weakness or fatigue, shortness of breath, chest pain, fast or irregular heartbeat, dizziness, swelling of the ankles, feet, or hands Hormone gland problems--headache, sensitivity to light, unusual weakness or fatigue, dizziness, fast or irregular heartbeat, increased sensitivity to cold or heat, excessive sweating, constipation, hair loss, increased thirst or amount of urine, tremors or shaking, irritability Infusion reactions--chest pain, shortness of breath or trouble breathing, feeling faint or lightheaded Kidney injury (glomerulonephritis)--decrease in the amount of urine, red or dark brown urine, foamy or bubbly urine, swelling of the ankles, hands, or feet Liver injury--right upper belly pain, loss of appetite, nausea, light-colored stool, dark yellow or brown urine, yellowing skin or  eyes, unusual weakness or fatigue Pain, tingling, or numbness in the hands or feet, muscle weakness, change in vision, confusion or trouble speaking, loss of balance or coordination, trouble walking, seizures Rash, fever, and swollen lymph nodes Redness, blistering, peeling, or loosening of the skin, including inside the mouth Sudden or severe stomach pain, bloody diarrhea, fever, nausea,  vomiting Side effects that usually do not require medical attention (report to your care team if they continue or are bothersome): Bone, joint, or muscle pain Diarrhea Fatigue Loss of appetite Nausea Skin rash This list may not describe all possible side effects. Call your doctor for medical advice about side effects. You may report side effects to FDA at 1-800-FDA-1088. Where should I keep my medication? This medication is given in a hospital or clinic. It will not be stored at home. NOTE: This sheet is a summary. It may not cover all possible information. If you have questions about this medicine, talk to your doctor, pharmacist, or health care provider.  2023 Elsevier/Gold Standard (2012-12-18 00:00:00)  Pemetrexed Injection What is this medication? PEMETREXED (PEM e TREX ed) treats some types of cancer. It works by slowing down the growth of cancer cells. This medicine may be used for other purposes; ask your health care provider or pharmacist if you have questions. COMMON BRAND NAME(S): Alimta, PEMFEXY What should I tell my care team before I take this medication? They need to know if you have any of these conditions: Infection, such as chickenpox, cold sores, or herpes Kidney disease Low blood cell levels (white cells, red cells, and platelets) Lung or breathing disease, such as asthma Radiation therapy An unusual or allergic reaction to pemetrexed, other medications, foods, dyes, or preservatives If you or your partner are pregnant or trying to get pregnant Breast-feeding How should I use this medication? This medication is injected into a vein. It is given by your care team in a hospital or clinic setting. Talk to your care team about the use of this medication in children. Special care may be needed. Overdosage: If you think you have taken too much of this medicine contact a poison control center or emergency room at once. NOTE: This medicine is only for you. Do not share  this medicine with others. What if I miss a dose? Keep appointments for follow-up doses. It is important not to miss your dose. Call your care team if you are unable to keep an appointment. What may interact with this medication? Do not take this medication with any of the following: Live virus vaccines This medication may also interact with the following: Ibuprofen This list may not describe all possible interactions. Give your health care provider a list of all the medicines, herbs, non-prescription drugs, or dietary supplements you use. Also tell them if you smoke, drink alcohol, or use illegal drugs. Some items may interact with your medicine. What should I watch for while using this medication? Your condition will be monitored carefully while you are receiving this medication. This medication may make you feel generally unwell. This is not uncommon as chemotherapy can affect healthy cells as well as cancer cells. Report any side effects. Continue your course of treatment even though you feel ill unless your care team tells you to stop. This medication can cause serious side effects. To reduce the risk, your care team may give you other medications to take before receiving this one. Be sure to follow the directions from your care team. This medication can cause a rash or  redness in areas of the body that have previously had radiation therapy. If you have had radiation therapy, tell your care team if you notice a rash in this area. This medication may increase your risk of getting an infection. Call your care team for advice if you get a fever, chills, sore throat, or other symptoms of a cold or flu. Do not treat yourself. Try to avoid being around people who are sick. Be careful brushing or flossing your teeth or using a toothpick because you may get an infection or bleed more easily. If you have any dental work done, tell your dentist you are receiving this medication. Avoid taking medications  that contain aspirin, acetaminophen, ibuprofen, naproxen, or ketoprofen unless instructed by your care team. These medications may hide a fever. Check with your care team if you have severe diarrhea, nausea, and vomiting, or if you sweat a lot. The loss of too much body fluid may make it dangerous for you to take this medication. Talk to your care team if you or your partner wish to become pregnant or think either of you might be pregnant. This medication can cause serious birth defects if taken during pregnancy and for 6 months after the last dose. A negative pregnancy test is required before starting this medication. A reliable form of contraception is recommended while taking this medication and for 6 months after the last dose. Talk to your care team about reliable forms of contraception. Do not father a child while taking this medication and for 3 months after the last dose. Use a condom while having sex during this time period. Do not breastfeed while taking this medication and for 1 week after the last dose. This medication may cause infertility. Talk to your care team if you are concerned about your fertility. What side effects may I notice from receiving this medication? Side effects that you should report to your care team as soon as possible: Allergic reactions--skin rash, itching, hives, swelling of the face, lips, tongue, or throat Dry cough, shortness of breath or trouble breathing Infection--fever, chills, cough, sore throat, wounds that don't heal, pain or trouble when passing urine, general feeling of discomfort or being unwell Kidney injury--decrease in the amount of urine, swelling of the ankles, hands, or feet Low red blood cell level--unusual weakness or fatigue, dizziness, headache, trouble breathing Redness, blistering, peeling, or loosening of the skin, including inside the mouth Unusual bruising or bleeding Side effects that usually do not require medical attention (report to  your care team if they continue or are bothersome): Fatigue Loss of appetite Nausea Vomiting This list may not describe all possible side effects. Call your doctor for medical advice about side effects. You may report side effects to FDA at 1-800-FDA-1088. Where should I keep my medication? This medication is given in a hospital or clinic. It will not be stored at home. NOTE: This sheet is a summary. It may not cover all possible information. If you have questions about this medicine, talk to your doctor, pharmacist, or health care provider.  2023 Elsevier/Gold Standard (2021-08-03 00:00:00)   Carboplatin Injection What is this medication? CARBOPLATIN (KAR boe pla tin) treats some types of cancer. It works by slowing down the growth of cancer cells. This medicine may be used for other purposes; ask your health care provider or pharmacist if you have questions. COMMON BRAND NAME(S): Paraplatin What should I tell my care team before I take this medication? They need to know if you have  any of these conditions: Blood disorders Hearing problems Kidney disease Recent or ongoing radiation therapy An unusual or allergic reaction to carboplatin, cisplatin, other medications, foods, dyes, or preservatives Pregnant or trying to get pregnant Breast-feeding How should I use this medication? This medication is injected into a vein. It is given by your care team in a hospital or clinic setting. Talk to your care team about the use of this medication in children. Special care may be needed. Overdosage: If you think you have taken too much of this medicine contact a poison control center or emergency room at once. NOTE: This medicine is only for you. Do not share this medicine with others. What if I miss a dose? Keep appointments for follow-up doses. It is important not to miss your dose. Call your care team if you are unable to keep an appointment. What may interact with this  medication? Medications for seizures Some antibiotics, such as amikacin, gentamicin, neomycin, streptomycin, tobramycin Vaccines This list may not describe all possible interactions. Give your health care provider a list of all the medicines, herbs, non-prescription drugs, or dietary supplements you use. Also tell them if you smoke, drink alcohol, or use illegal drugs. Some items may interact with your medicine. What should I watch for while using this medication? Your condition will be monitored carefully while you are receiving this medication. You may need blood work while taking this medication. This medication may make you feel generally unwell. This is not uncommon, as chemotherapy can affect healthy cells as well as cancer cells. Report any side effects. Continue your course of treatment even though you feel ill unless your care team tells you to stop. In some cases, you may be given additional medications to help with side effects. Follow all directions for their use. This medication may increase your risk of getting an infection. Call your care team for advice if you get a fever, chills, sore throat, or other symptoms of a cold or flu. Do not treat yourself. Try to avoid being around people who are sick. Avoid taking medications that contain aspirin, acetaminophen, ibuprofen, naproxen, or ketoprofen unless instructed by your care team. These medications may hide a fever. Be careful brushing or flossing your teeth or using a toothpick because you may get an infection or bleed more easily. If you have any dental work done, tell your dentist you are receiving this medication. Talk to your care team if you wish to become pregnant or think you might be pregnant. This medication can cause serious birth defects. Talk to your care team about effective forms of contraception. Do not breast-feed while taking this medication. What side effects may I notice from receiving this medication? Side effects  that you should report to your care team as soon as possible: Allergic reactions--skin rash, itching, hives, swelling of the face, lips, tongue, or throat Infection--fever, chills, cough, sore throat, wounds that don't heal, pain or trouble when passing urine, general feeling of discomfort or being unwell Low red blood cell level--unusual weakness or fatigue, dizziness, headache, trouble breathing Pain, tingling, or numbness in the hands or feet, muscle weakness, change in vision, confusion or trouble speaking, loss of balance or coordination, trouble walking, seizures Unusual bruising or bleeding Side effects that usually do not require medical attention (report to your care team if they continue or are bothersome): Hair loss Nausea Unusual weakness or fatigue Vomiting This list may not describe all possible side effects. Call your doctor for medical advice about side  effects. You may report side effects to FDA at 1-800-FDA-1088. Where should I keep my medication? This medication is given in a hospital or clinic. It will not be stored at home. NOTE: This sheet is a summary. It may not cover all possible information. If you have questions about this medicine, talk to your doctor, pharmacist, or health care provider.  2023 Elsevier/Gold Standard (2021-07-13 00:00:00)  Iron Sucrose Injection What is this medication? IRON SUCROSE (EYE ern SOO krose) treats low levels of iron (iron deficiency anemia) in people with kidney disease. Iron is a mineral that plays an important role in making red blood cells, which carry oxygen from your lungs to the rest of your body. This medicine may be used for other purposes; ask your health care provider or pharmacist if you have questions. COMMON BRAND NAME(S): Venofer What should I tell my care team before I take this medication? They need to know if you have any of these conditions: Anemia not caused by low iron levels Heart disease High levels of iron in  the blood Kidney disease Liver disease An unusual or allergic reaction to iron, other medications, foods, dyes, or preservatives Pregnant or trying to get pregnant Breastfeeding How should I use this medication? This medication is for infusion into a vein. It is given in a hospital or clinic setting. Talk to your care team about the use of this medication in children. While this medication may be prescribed for children as young as 2 years for selected conditions, precautions do apply. Overdosage: If you think you have taken too much of this medicine contact a poison control center or emergency room at once. NOTE: This medicine is only for you. Do not share this medicine with others. What if I miss a dose? Keep appointments for follow-up doses. It is important not to miss your dose. Call your care team if you are unable to keep an appointment. What may interact with this medication? Do not take this medication with any of the following: Deferoxamine Dimercaprol Other iron products This medication may also interact with the following: Chloramphenicol Deferasirox This list may not describe all possible interactions. Give your health care provider a list of all the medicines, herbs, non-prescription drugs, or dietary supplements you use. Also tell them if you smoke, drink alcohol, or use illegal drugs. Some items may interact with your medicine. What should I watch for while using this medication? Visit your care team regularly. Tell your care team if your symptoms do not start to get better or if they get worse. You may need blood work done while you are taking this medication. You may need to follow a special diet. Talk to your care team. Foods that contain iron include: whole grains/cereals, dried fruits, beans, or peas, leafy green vegetables, and organ meats (liver, kidney). What side effects may I notice from receiving this medication? Side effects that you should report to your care  team as soon as possible: Allergic reactions--skin rash, itching, hives, swelling of the face, lips, tongue, or throat Low blood pressure--dizziness, feeling faint or lightheaded, blurry vision Shortness of breath Side effects that usually do not require medical attention (report to your care team if they continue or are bothersome): Flushing Headache Joint pain Muscle pain Nausea Pain, redness, or irritation at injection site This list may not describe all possible side effects. Call your doctor for medical advice about side effects. You may report side effects to FDA at 1-800-FDA-1088. Where should I keep my medication?  This medication is given in a hospital or clinic and will not be stored at home. NOTE: This sheet is a summary. It may not cover all possible information. If you have questions about this medicine, talk to your doctor, pharmacist, or health care provider.  2023 Elsevier/Gold Standard (2020-07-10 00:00:00)

## 2022-05-07 NOTE — Progress Notes (Signed)
Nutrition Follow-up:  Referral from Dr Janese Banks  Patient with lung cancer with bone metastases.  Patient receiving Norma Fredrickson, [place], [place].    Met with patient and sister during infusion.  Patient reports that she is having difficulty swallowing foods.  Has an EGD set up for 2/26.  Has been drinking protein shakes (30 g), eating ice cream, pudding, milkshakes.    Medications: reviewed  Labs: reviewed  Anthropometrics:   Weight 111 lb 12.8 oz on 1/25 119 lb 3.2 oz on 12/14 130 lb on 02/10/22   NUTRITION DIAGNOSIS: Inadequate oral intake continues    INTERVENTION:  Recommend higher calorie shake (350 + calories) with continued weight loss. Samples of boost plus, ensure complete and boost [facility] given to patient to try.   Discussed ways to add calories and protein in diet.  High Calorie, High Protein diet handout provided to patient Contact information provided to patient    MONITORING, EVALUATION, GOAL: weight trends, intake   NEXT VISIT: Friday, Feb 16 during infusion  Srihaan Mastrangelo B. Zenia Resides, [place], [place] Registered Dietitian [phone]

## 2022-05-10 ENCOUNTER — Ambulatory Visit
Admission: RE | Admit: 2022-05-10 | Discharge: 2022-05-10 | Disposition: A | Discharge status: Home / Self Care | Payer: Medicare HMO | Source: Ambulatory Visit | Attending: Radiation Oncology | Admitting: Radiation Oncology

## 2022-05-10 ENCOUNTER — Encounter: Payer: Self-pay | Admitting: Radiation Oncology

## 2022-05-10 ENCOUNTER — Other Ambulatory Visit: Payer: Self-pay | Admitting: *Deleted

## 2022-05-10 VITALS — BP 137/83 | HR 124 | Temp 98.5°F | Resp 16 | Ht 60.0 in | Wt 113.0 lb

## 2022-05-10 DIAGNOSIS — C801 Malignant (primary) neoplasm, unspecified: Secondary | ICD-10-CM | POA: Diagnosis not present

## 2022-05-10 DIAGNOSIS — C770 Secondary and unspecified malignant neoplasm of lymph nodes of head, face and neck: Secondary | ICD-10-CM | POA: Insufficient documentation

## 2022-05-10 DIAGNOSIS — C3490 Malignant neoplasm of unspecified part of unspecified bronchus or lung: Secondary | ICD-10-CM

## 2022-05-10 DIAGNOSIS — Z923 Personal history of irradiation: Secondary | ICD-10-CM | POA: Insufficient documentation

## 2022-05-10 MED ORDER — APIXABAN 5 MG PO TABS: 5.0000 mg | ORAL_TABLET | Freq: Two times a day (BID) | ORAL | 2 refills | Status: DC

## 2022-05-10 MED FILL — Iron Sucrose Inj 20 MG/ML (Fe Equiv): INTRAVENOUS | Qty: 10 | Status: AC

## 2022-05-10 NOTE — Progress Notes (Signed)
Radiation Oncology Follow up Note  Name: Amber Glenn   Date:   05/10/2022 MRN:  [id number] DOB: [birth year]    This 72 y.o. female presents to the clinic today for 1 month follow-up status post palliative radiation therapy to L1 status post open reduction and internal fixation patient with known stage IV lung cancer.  REFERRING PROVIDER: Lynnell Jude, MD  HPI: Patient is a 72 year old female now 1 month out having palliative radiation therapy to her lumbar spine status post open reduction internal fixation for metastatic stage IV lung cancer seen today in routine follow-up she is doing well she states the pain is resolved.  She is having no focal neurologic deficits is ambulating well..  COMPLICATIONS OF TREATMENT: none  FOLLOW UP COMPLIANCE: keeps appointments   PHYSICAL EXAM:  BP 137/83 (BP Location: Left Arm, Patient Position: Sitting, Cuff Size: Small)   Pulse (!) 124 Comment: Patient states normal high HR  Temp 98.5 F (36.9 C) (Tympanic)   Resp 16   Ht 5' (1.524 m) Comment: stated HT  Wt 113 lb (51.3 kg)   BMI 22.07 kg/m  Motor and sensory levels in her lower extremities are equal and symmetric proprioception is intact.  Range of motion of her lower extremities does not elicit pain deep palpation of her spine does not elicit pain.  Well-developed well-nourished patient in NAD. HEENT reveals PERLA, EOMI, discs not visualized.  Oral cavity is clear. No oral mucosal lesions are identified. Neck is clear without evidence of cervical or supraclavicular adenopathy. Lungs are clear to A&P. Cardiac examination is essentially unremarkable with regular rate and rhythm without murmur rub or thrill. Abdomen is benign with no organomegaly or masses noted. Motor sensory and DTR levels are equal and symmetric in the upper and lower extremities. Cranial nerves II through XII are grossly intact. Proprioception is intact. No peripheral adenopathy or edema is identified. No motor or sensory levels  are noted. Crude visual fields are within normal range.  RADIOLOGY RESULTS: No current films for review  PLAN: Present time patient is achieved excellent palliation from her radiation.  I am will turn follow-up care over to medical oncology.  I be happy to reevaluate the patient anytime should that be indicated.  I would like to take this opportunity to thank you for allowing me to participate in the care of your patient.Noreene Filbert, MD

## 2022-05-11 ENCOUNTER — Other Ambulatory Visit: Payer: Self-pay | Admitting: *Deleted

## 2022-05-11 ENCOUNTER — Inpatient Hospital Stay (HOSPITAL_BASED_OUTPATIENT_CLINIC_OR_DEPARTMENT_OTHER): Payer: Medicare HMO | Admitting: Hospice and Palliative Medicine

## 2022-05-11 DIAGNOSIS — G893 Neoplasm related pain (acute) (chronic): Secondary | ICD-10-CM | POA: Diagnosis not present

## 2022-05-11 DIAGNOSIS — Z515 Encounter for palliative care: Secondary | ICD-10-CM | POA: Diagnosis not present

## 2022-05-11 DIAGNOSIS — C3491 Malignant neoplasm of unspecified part of right bronchus or lung: Secondary | ICD-10-CM

## 2022-05-11 MED ORDER — CYCLOBENZAPRINE HCL 5 MG PO TABS: 5.0000 mg | ORAL_TABLET | Freq: Three times a day (TID) | ORAL | 0 refills | Status: DC | PRN

## 2022-05-11 NOTE — Telephone Encounter (Signed)
Refill request for [facility]- requesting RF for cyclobenzaprine

## 2022-05-11 NOTE — Progress Notes (Signed)
Virtual Visit via Telephone Note  I connected with [name] on 05/11/22 at  3:40 PM EST by telephone and verified that I am speaking with the correct person using two identifiers.  Location: Patient: Home Provider: Clinic   I discussed the limitations, risks, security and privacy concerns of performing an evaluation and management service by telephone and the availability of in person appointments. I also discussed with the patient that there may be a patient responsible charge related to this service. The patient expressed understanding and agreed to proceed.   History of Present Illness: KYMBERLEY RAZ is a 72 y.o. female with multiple medical problems including diabetes, hypertension, hyperlipidemia, depression Patient was hospitalized 02/10/2022 to 02/18/2022 with back pain.  MRI of the lumbar spine revealed an acute pathologic compression fracture of L1 and patient ultimately required percutaneous fixation by neurosurgery.  CT of the chest, abdomen, and pelvis revealed a centrally necrotic anterior mediastinal mass with phrenic nerve impingement and diaphragmatic dysfunction, scattered bilateral lung opacities, hypoenhancement in the inferior pancreatic head/body.  Patient underwent lymph node biopsy positive for adenocarcinoma of the lung.  Patient's hospitalization was complicated by pain and delirium.  Palliative care was consulted to help address goals and manage ongoing symptoms.    Observations/Objective: I spoke with patient and daughter by phone.  Patient is status post kyphoplasty and RFA.  Patient reports she is doing reasonably well.  She did have a fall recently and has had some slight discomfort secondary to that.  She had subsequent follow-up with neurosurgery who felt it was likely related to soft tissue injury.  She reports overall improvement in pain but still has some days where she requires oxycodone IR frequently, up to every 4 hours for breakthrough pain.  Other  days, she will only require oxycodone once or twice.  She is having more good days than bad.  She denies any adverse effects from pain medications.  She is managing constipation with use of MiraLAX.  Assessment and Plan: Neoplasm related pain continue gabapentin.  Continue Xtampza and oxycodone.  Stage IV adenocarcinoma lung -patient has follow-up scheduled with Dr. Janese Banks in two weeks  Follow Up Instructions: Follow-up telephone visit 1-2 months   I discussed the assessment and treatment plan with the patient. The patient was provided an opportunity to ask questions and all were answered. The patient agreed with the plan and demonstrated an understanding of the instructions.   The patient was advised to call back or seek an in-person evaluation if the symptoms worsen or if the condition fails to improve as anticipated.  I provided 10 minutes of non-face-to-face time during this encounter.   [name], NP

## 2022-05-13 ENCOUNTER — Telehealth: Payer: Medicare HMO | Admitting: Hospice and Palliative Medicine

## 2022-05-20 ENCOUNTER — Telehealth: Payer: Self-pay | Admitting: *Deleted

## 2022-05-20 ENCOUNTER — Ambulatory Visit
Admission: RE | Admit: 2022-05-20 | Discharge: 2022-05-20 | Disposition: A | Discharge status: Home / Self Care | Payer: Medicare HMO | Source: Ambulatory Visit | Attending: Family Medicine | Admitting: Family Medicine

## 2022-05-20 ENCOUNTER — Other Ambulatory Visit: Payer: Self-pay | Admitting: *Deleted

## 2022-05-20 ENCOUNTER — Other Ambulatory Visit: Payer: Self-pay | Admitting: Family Medicine

## 2022-05-20 ENCOUNTER — Ambulatory Visit
Admission: RE | Admit: 2022-05-20 | Discharge: 2022-05-20 | Disposition: A | Discharge status: Home / Self Care | Payer: Medicare HMO | Attending: Family Medicine | Admitting: Family Medicine

## 2022-05-20 DIAGNOSIS — R059 Cough, unspecified: Secondary | ICD-10-CM | POA: Insufficient documentation

## 2022-05-20 MED ORDER — OXYCODONE HCL 20 MG PO TABS: 10.0000 mg | ORAL_TABLET | ORAL | 0 refills | Status: DC | PRN

## 2022-05-20 MED ORDER — XTAMPZA ER 18 MG PO C12A: 1.0000 | EXTENDED_RELEASE_CAPSULE | Freq: Two times a day (BID) | ORAL | 0 refills | Status: DC

## 2022-05-20 NOTE — Telephone Encounter (Signed)
Per pt's daughter, [name], pt starting to have "wet" cough recently and she listened to her breath sounds and noticed that has decreased breath sounds in the left lower lung. They have her scheduled to follow up with PCP this afternoon but wanted out office to be aware as well. No other symptoms such as fever or shortness of breath at this time.

## 2022-05-21 ENCOUNTER — Encounter: Payer: Self-pay | Admitting: Oncology

## 2022-05-21 ENCOUNTER — Other Ambulatory Visit: Payer: Self-pay | Admitting: *Deleted

## 2022-05-21 MED ORDER — XTAMPZA ER 18 MG PO C12A: 1.0000 | EXTENDED_RELEASE_CAPSULE | Freq: Two times a day (BID) | ORAL | 0 refills | Status: DC

## 2022-05-21 MED ORDER — OXYCODONE HCL 20 MG PO TABS: 10.0000 mg | ORAL_TABLET | ORAL | 0 refills | Status: DC | PRN

## 2022-05-27 MED FILL — Fosaprepitant Dimeglumine For IV Infusion 150 MG (Base Eq): INTRAVENOUS | Qty: 5 | Status: AC

## 2022-05-27 MED FILL — Dexamethasone Sodium Phosphate Inj 100 MG/10ML: INTRAMUSCULAR | Qty: 1 | Status: AC

## 2022-05-28 ENCOUNTER — Inpatient Hospital Stay (HOSPITAL_BASED_OUTPATIENT_CLINIC_OR_DEPARTMENT_OTHER): Payer: Medicare HMO | Admitting: Oncology

## 2022-05-28 ENCOUNTER — Encounter: Payer: Self-pay | Admitting: Oncology

## 2022-05-28 ENCOUNTER — Inpatient Hospital Stay: Payer: Medicare HMO

## 2022-05-28 ENCOUNTER — Inpatient Hospital Stay: Payer: Medicare HMO | Attending: Oncology

## 2022-05-28 VITALS — BP 150/70 | HR 108 | Temp 99.4°F | Resp 18 | Ht 60.0 in | Wt 118.5 lb

## 2022-05-28 VITALS — HR 101

## 2022-05-28 DIAGNOSIS — Z7901 Long term (current) use of anticoagulants: Secondary | ICD-10-CM | POA: Diagnosis not present

## 2022-05-28 DIAGNOSIS — I10 Essential (primary) hypertension: Secondary | ICD-10-CM | POA: Diagnosis not present

## 2022-05-28 DIAGNOSIS — Z5111 Encounter for antineoplastic chemotherapy: Secondary | ICD-10-CM

## 2022-05-28 DIAGNOSIS — D509 Iron deficiency anemia, unspecified: Secondary | ICD-10-CM | POA: Insufficient documentation

## 2022-05-28 DIAGNOSIS — C3491 Malignant neoplasm of unspecified part of right bronchus or lung: Secondary | ICD-10-CM

## 2022-05-28 DIAGNOSIS — Z5112 Encounter for antineoplastic immunotherapy: Secondary | ICD-10-CM | POA: Insufficient documentation

## 2022-05-28 DIAGNOSIS — Z87891 Personal history of nicotine dependence: Secondary | ICD-10-CM | POA: Diagnosis not present

## 2022-05-28 DIAGNOSIS — K5732 Diverticulitis of large intestine without perforation or abscess without bleeding: Secondary | ICD-10-CM | POA: Diagnosis not present

## 2022-05-28 DIAGNOSIS — Z86711 Personal history of pulmonary embolism: Secondary | ICD-10-CM | POA: Insufficient documentation

## 2022-05-28 DIAGNOSIS — C7951 Secondary malignant neoplasm of bone: Secondary | ICD-10-CM | POA: Diagnosis not present

## 2022-05-28 DIAGNOSIS — D508 Other iron deficiency anemias: Secondary | ICD-10-CM

## 2022-05-28 DIAGNOSIS — Z803 Family history of malignant neoplasm of breast: Secondary | ICD-10-CM | POA: Diagnosis not present

## 2022-05-28 DIAGNOSIS — Z85828 Personal history of other malignant neoplasm of skin: Secondary | ICD-10-CM | POA: Diagnosis not present

## 2022-05-28 DIAGNOSIS — E1136 Type 2 diabetes mellitus with diabetic cataract: Secondary | ICD-10-CM | POA: Diagnosis not present

## 2022-05-28 DIAGNOSIS — G893 Neoplasm related pain (acute) (chronic): Secondary | ICD-10-CM | POA: Insufficient documentation

## 2022-05-28 LAB — CBC WITH DIFFERENTIAL/PLATELET
Abs Immature Granulocytes: 0.21 10*3/uL — ABNORMAL HIGH (ref 0.00–0.07)
Basophils Absolute: 0 10*3/uL (ref 0.0–0.1)
Basophils Relative: 0 %
Eosinophils Absolute: 0 10*3/uL (ref 0.0–0.5)
Eosinophils Relative: 0 %
HCT: 31.5 % — ABNORMAL LOW (ref 36.0–46.0)
Hemoglobin: 10 g/dL — ABNORMAL LOW (ref 12.0–15.0)
Immature Granulocytes: 4 %
Lymphocytes Relative: 17 %
Lymphs Abs: 0.8 10*3/uL (ref 0.7–4.0)
MCH: 28.7 pg (ref 26.0–34.0)
MCHC: 31.7 g/dL (ref 30.0–36.0)
MCV: 90.3 fL (ref 80.0–100.0)
Monocytes Absolute: 0.4 10*3/uL (ref 0.1–1.0)
Monocytes Relative: 8 %
Neutro Abs: 3.4 10*3/uL (ref 1.7–7.7)
Neutrophils Relative %: 71 %
Platelets: 331 10*3/uL (ref 150–400)
RBC: 3.49 MIL/uL — ABNORMAL LOW (ref 3.87–5.11)
RDW: 21.7 % — ABNORMAL HIGH (ref 11.5–15.5)
WBC: 4.8 10*3/uL (ref 4.0–10.5)
nRBC: 0 % (ref 0.0–0.2)

## 2022-05-28 LAB — COMPREHENSIVE METABOLIC PANEL
ALT: 18 U/L (ref 0–44)
AST: 28 U/L (ref 15–41)
Albumin: 3.5 g/dL (ref 3.5–5.0)
Alkaline Phosphatase: 94 U/L (ref 38–126)
Anion gap: 10 (ref 5–15)
BUN: 26 mg/dL — ABNORMAL HIGH (ref 8–23)
CO2: 24 mmol/L (ref 22–32)
Calcium: 9.2 mg/dL (ref 8.9–10.3)
Chloride: 97 mmol/L — ABNORMAL LOW (ref 98–111)
Creatinine, Ser: 0.58 mg/dL (ref 0.44–1.00)
GFR, Estimated: >60 mL/min (ref >60)
Glucose, Bld: 214 mg/dL — ABNORMAL HIGH (ref 70–99)
Potassium: 3.9 mmol/L (ref 3.5–5.1)
Sodium: 131 mmol/L — ABNORMAL LOW (ref 135–145)
Total Bilirubin: 0.3 mg/dL (ref 0.3–1.2)
Total Protein: 6.8 g/dL (ref 6.5–8.1)

## 2022-05-28 MED ORDER — HEPARIN SOD (PORK) LOCK FLUSH 100 UNIT/ML IV SOLN
500.0000 [IU] | Freq: Once | INTRAVENOUS | Status: AC | PRN
  Administered 2022-05-28: 500 [IU]
  Filled 2022-05-28: qty 5

## 2022-05-28 MED ORDER — SODIUM CHLORIDE 0.9 % IV SOLN
150.0000 mg | Freq: Once | INTRAVENOUS | Status: AC
  Administered 2022-05-28: 150 mg via INTRAVENOUS
  Filled 2022-05-28: qty 150

## 2022-05-28 MED ORDER — SODIUM CHLORIDE 0.9 % IV SOLN
Freq: Once | INTRAVENOUS | Status: AC
  Filled 2022-05-28: qty 250

## 2022-05-28 MED ORDER — SODIUM CHLORIDE 0.9 % IV SOLN
10.0000 mg | Freq: Once | INTRAVENOUS | Status: AC
  Administered 2022-05-28: 10 mg via INTRAVENOUS
  Filled 2022-05-28: qty 10

## 2022-05-28 MED ORDER — SODIUM CHLORIDE 0.9 % IV SOLN
200.0000 mg | INTRAVENOUS | Status: DC
  Administered 2022-05-28: 200 mg via INTRAVENOUS
  Filled 2022-05-28: qty 200

## 2022-05-28 MED ORDER — PALONOSETRON HCL INJECTION 0.25 MG/5ML
0.2500 mg | Freq: Once | INTRAVENOUS | Status: AC
  Administered 2022-05-28: 0.25 mg via INTRAVENOUS
  Filled 2022-05-28: qty 5

## 2022-05-28 MED ORDER — CYANOCOBALAMIN 1000 MCG/ML IJ SOLN
1000.0000 ug | Freq: Once | INTRAMUSCULAR | Status: AC
  Administered 2022-05-28: 1000 ug via INTRAMUSCULAR
  Filled 2022-05-28: qty 1

## 2022-05-28 MED ORDER — SODIUM CHLORIDE 0.9 % IV SOLN
200.0000 mg | Freq: Once | INTRAVENOUS | Status: AC
  Administered 2022-05-28: 200 mg via INTRAVENOUS
  Filled 2022-05-28: qty 8

## 2022-05-28 MED ORDER — SODIUM CHLORIDE 0.9 % IV SOLN
500.0000 mg/m2 | Freq: Once | INTRAVENOUS | Status: AC
  Administered 2022-05-28: 800 mg via INTRAVENOUS
  Filled 2022-05-28: qty 20

## 2022-05-28 MED ORDER — SODIUM CHLORIDE 0.9 % IV SOLN
338.5000 mg | Freq: Once | INTRAVENOUS | Status: AC
  Administered 2022-05-28: 350 mg via INTRAVENOUS
  Filled 2022-05-28: qty 35

## 2022-05-28 NOTE — Progress Notes (Signed)
No concerns for the provider.

## 2022-05-28 NOTE — Progress Notes (Signed)
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  She smoked about 2 packs of cigarettes per day but quit smoking in 2010.  She presented to the ER withSymptoms of significant back pain MRIs lumbar spine without contrast showed pathologic fracture of the L1 vertebral body with posterior bulging of the cortex resulting in mild spinal canal stenosis.  Diffuse T1 hypointense abnormality in the L1 vertebral body with involvement of bilateral pedicles and transverse processes.  CT chest abdomen and pelvis with contrast showed 1 cm left supraclavicular adenopathy.  1 cm right subcarinal lymph node.  Necrotic 3.1 x 2.4 x 2.9 cm anterior mediastinal mass in the left prevascular region with mild mass effect on the brachiocephalic vein.  Possible phrenic nerve involvement.  Mild acute sigmoid colon diverticulitis.    Patient had biopsy of the left supraclavicular lymph node which was consistent with adenocarcinoma of lung primary.  NGS testing did not show any actionable mutations.  K-ras G12 D mutation detected.  MSI stable.  Patient underwent percutaneous fixation of the L1 vertebral body.  She completed palliative radiation treatment to her spine  Interval history-tolerating chemotherapy well without any significant nausea vomiting or other GI side effects.  Back pain is presently well-controlled and she is using about 2-3 as needed oxycodone per day.  ECOG PS- 1 Pain scale-  2 Opioid associated constipation- no  Review of systems- Review of Systems  Constitutional:  Positive for malaise/fatigue. Negative for chills, fever and weight loss.  HENT:  Negative for congestion, ear discharge and nosebleeds.   Eyes:  Negative for blurred vision.  Respiratory:  Negative for cough, hemoptysis, sputum production, shortness of breath and wheezing.   Cardiovascular:  Negative for chest pain, palpitations, orthopnea and claudication.  Gastrointestinal:  Negative for abdominal pain, blood in stool, constipation, diarrhea, heartburn, melena, nausea and vomiting.  Genitourinary:  Negative for dysuria, flank pain, frequency, hematuria and urgency.  Musculoskeletal:  Negative for back pain, joint pain and myalgias.  Skin:  Negative for rash.  Neurological:  Negative for dizziness, tingling, focal weakness, seizures, weakness and headaches.  Endo/Heme/Allergies:  Does not bruise/bleed easily.  Psychiatric/Behavioral:  Negative for depression and suicidal ideas. The patient does not have insomnia.       No Known Allergies   Past Medical History:  Diagnosis Date   Arthritis    hands and feet   Cancer associated pain    Cataract    right   Complication of anesthesia    sometimes have a hard time waking up   Diabetes mellitus    Borderline Diabetes   GERD (gastroesophageal reflux disease)    HTN (hypertension)    Hyperlipidemia    Motion sickness    Post corneal transplant    right     Past Surgical History:  Procedure Laterality Date   APPLICATION OF INTRAOPERATIVE CT SCAN  02/15/2022   Procedure: APPLICATION OF INTRAOPERATIVE CT SCAN;  Surgeon: Meade Maw, MD;  Location: [facility] ORS;  Service: Neurosurgery;;  BASAL CELL CARCINOMA EXCISION     Dr. Manley Mason   CARPAL TUNNEL RELEASE     right   COLONOSCOPY WITH PROPOFOL N/A 12/03/2015   Procedure: COLONOSCOPY WITH PROPOFOL;  Surgeon: Manya Silvas, MD;  Location: [facility] ENDOSCOPY;  Service: Endoscopy;   Laterality: N/A;   COLONOSCOPY WITH PROPOFOL N/A 02/22/2020   Procedure: COLONOSCOPY WITH PROPOFOL;  Surgeon: Lesly Rubenstein, MD;  Location: [facility] ENDOSCOPY;  Service: Endoscopy;  Laterality: N/A;   ESOPHAGOGASTRODUODENOSCOPY (EGD) WITH PROPOFOL N/A 02/22/2020   Procedure: ESOPHAGOGASTRODUODENOSCOPY (EGD) WITH PROPOFOL;  Surgeon: Lesly Rubenstein, MD;  Location: [facility] ENDOSCOPY;  Service: Endoscopy;  Laterality: N/A;   HAMMER TOE SURGERY Right 05/23/2019   Procedure: HAMMER TOE CORRECTION;  Surgeon: Samara Deist, DPM;  Location: [place];  Service: Podiatry;  Laterality: Right;  Diabetic   IR BONE TUMOR(S)RF ABLATION  04/30/2022   IR IMAGING GUIDED PORT INSERTION  04/14/2022   IR KYPHO LUMBAR INC FX REDUCE BONE BX UNI/BIL CANNULATION INC/IMAGING  04/30/2022   IR RADIOLOGIST EVAL & MGMT  03/24/2022   OSTECTOMY Right 05/23/2019   Procedure: DOUBLE OSTEOTOMY RIGHT;  Surgeon: Samara Deist, DPM;  Location: [place];  Service: Podiatry;  Laterality: Right;   SKIN CANCER EXCISION      Social History   Socioeconomic History   Marital status: Widowed    Spouse name: Not on file   Number of children: Not on file   Years of education: Not on file   Highest education level: Not on file  Occupational History    Employer: [employer]    Comment: Currently not employed  Tobacco Use   Smoking status: Former    Types: Cigarettes    Quit date: 01/13/2009    Years since quitting: 13.3   Smokeless tobacco: Never  Vaping Use   Vaping Use: Never used  Substance and Sexual Activity   Alcohol use: No   Drug use: No   Sexual activity: Not Currently    Birth control/protection: Post-menopausal  Other Topics Concern   Not on file  Social History Narrative   Not on file   Social Determinants of Health   Financial Resource Strain: Not on file  Food Insecurity: No Food Insecurity (03/31/2022)   Hunger Vital Sign    Worried About Running Out of Food in the Last Year: Never  true    Ran Out of Food in the Last Year: Never true  Transportation Needs: No Transportation Needs (03/31/2022)   PRAPARE - Hydrologist (Medical): No    Lack of Transportation (Non-Medical): No  Physical Activity: Not on file  Stress: Not on file  Social Connections: Not on file  Intimate Partner Violence: Not At Risk (02/10/2022)   Humiliation, Afraid, Rape, and Kick questionnaire    Fear of Current or Ex-Partner: No    Emotionally Abused: No    Physically Abused: No    Sexually Abused: No    Family History  Problem Relation Age of Onset   Heart disease Mother    Heart disease Father    Heart disease Maternal Grandmother    Heart disease Maternal Grandfather    Breast cancer Sister 8     Current Outpatient Medications:    acetaminophen (TYLENOL) 500 MG tablet, Take 500 mg by mouth every 6 (six) hours as needed for moderate pain, mild pain, fever or headache., Disp: , Rfl:    apixaban (ELIQUIS) 5 MG TABS tablet, Take 1 tablet (5 mg  total) by mouth 2 (two) times daily., Disp: 60 tablet, Rfl: 2   Ascorbic Acid (VITAMIN C) 1000 MG tablet, Take 1,000 mg by mouth daily.  , Disp: , Rfl:    cyclobenzaprine (FLEXERIL) 5 MG tablet, Take 1 tablet (5 mg total) by mouth 3 (three) times daily as needed for muscle spasms., Disp: 30 tablet, Rfl: 0   Docusate Calcium (STOOL SOFTENER PO), Take 1 capsule by mouth daily as needed (constipation)., Disp: , Rfl:    folic acid (FOLVITE) 1 MG tablet, Take 1 tablet (1 mg total) by mouth daily. Start 7 days before pemetrexed chemotherapy. Continue until 21 days after pemetrexed completed., Disp: 100 tablet, Rfl: 3   gabapentin (NEURONTIN) 300 MG capsule, Take 1 capsule (300 mg total) by mouth 3 (three) times daily., Disp: 90 capsule, Rfl: 2   lidocaine-prilocaine (EMLA) cream, Apply 1 Application topically as needed., Disp: 30 g, Rfl: 2   LORazepam (ATIVAN) 0.5 MG tablet, Take 1 tablet (0.5 mg total) by mouth every 8 (eight)  hours as needed for anxiety., Disp: 30 tablet, Rfl: 0   metoprolol tartrate (LOPRESSOR) 25 MG tablet, Take 1 tablet (25 mg total) by mouth 2 (two) times daily., Disp: 60 tablet, Rfl: 0   Multiple Vitamin (MULTIVITAMIN) capsule, Take 1 capsule by mouth daily.  , Disp: , Rfl:    omeprazole (PRILOSEC) 20 MG capsule, Take 1 capsule (20 mg total) by mouth daily., Disp: 90 capsule, Rfl: 3   ondansetron (ZOFRAN) 8 MG tablet, Take 1 tablet (8 mg total) by mouth every 8 (eight) hours as needed for nausea or vomiting. Start on the third day after carboplatin., Disp: 30 tablet, Rfl: 1   oxyCODONE ER (XTAMPZA ER) 18 MG C12A, Take 1 capsule by mouth every 12 (twelve) hours., Disp: 60 capsule, Rfl: 0   Oxycodone HCl 20 MG TABS, Take 0.5-1 tablets (10-20 mg total) by mouth every 4 (four) hours as needed., Disp: 60 tablet, Rfl: 0   polyethylene glycol (MIRALAX / GLYCOLAX) 17 g packet, Take 17 g by mouth daily., Disp: 14 each, Rfl: 0   prednisoLONE acetate (PRED MILD) 0.12 % ophthalmic suspension, Place 1 drop into both eyes daily., Disp: , Rfl:    prochlorperazine (COMPAZINE) 10 MG tablet, Take 1 tablet (10 mg total) by mouth every 6 (six) hours as needed for nausea or vomiting., Disp: 30 tablet, Rfl: 2   simvastatin (ZOCOR) 40 MG tablet, TAKE 1 TABLET EVERY DAY  AT  6:00 PM, Disp: 90 tablet, Rfl: 3 No current facility-administered medications for this visit.  Facility-Administered Medications Ordered in Other Visits:    CARBOplatin (PARAPLATIN) 350 mg in sodium chloride 0.9 % 100 mL chemo infusion, 350 mg, Intravenous, Once, Sindy Guadeloupe, MD, Last Rate: 270 mL/hr at 05/28/22 1253, 350 mg at 05/28/22 1253   heparin lock flush 100 unit/mL, 500 Units, Intracatheter, Once PRN, Sindy Guadeloupe, MD   iron sucrose (VENOFER) 200 mg in sodium chloride 0.9 % 100 mL IVPB, 200 mg, Intravenous, Weekly, Borders, Kirt Boys, NP  Physical exam:  Vitals:   05/28/22 0930  BP: (!) 150/70  Pulse: (!) 108  Resp: 18  Temp: 99.4  F (37.4 C)  TempSrc: Tympanic  SpO2: 100%  Weight: 118 lb 8 oz (53.8 kg)  Height: 5' (1.524 m)   Physical Exam Cardiovascular:     Rate and Rhythm: Normal rate and regular rhythm.     Heart sounds: Normal heart sounds.  Pulmonary:     Effort: Pulmonary effort  is normal.     Breath sounds: Normal breath sounds.  Abdominal:     General: Bowel sounds are normal.     Palpations: Abdomen is soft.  Skin:    General: Skin is warm and dry.  Neurological:     Mental Status: She is alert and oriented to person, place, and time.         Latest Ref Rng & Units 05/28/2022    9:14 AM  CMP  Glucose 70 - 99 mg/dL 214   BUN 8 - 23 mg/dL 26   Creatinine 0.44 - 1.00 mg/dL 0.58   Sodium 135 - 145 mmol/L 131   Potassium 3.5 - 5.1 mmol/L 3.9   Chloride 98 - 111 mmol/L 97   CO2 22 - 32 mmol/L 24   Calcium 8.9 - 10.3 mg/dL 9.2   Total Protein 6.5 - 8.1 g/dL 6.8   Total Bilirubin 0.3 - 1.2 mg/dL 0.3   Alkaline Phos 38 - 126 U/L 94   AST 15 - 41 U/L 28   ALT 0 - 44 U/L 18       Latest Ref Rng & Units 05/28/2022    9:14 AM  CBC  WBC 4.0 - 10.5 K/uL 4.8   Hemoglobin 12.0 - 15.0 g/dL 10.0   Hematocrit 36.0 - 46.0 % 31.5   Platelets 150 - 400 K/uL 331     No images are attached to the encounter.  DG Chest 2 View  Result Date: 05/22/2022 CLINICAL DATA:  Cough, shortness of breath, and fatigue beginning yesterday. EXAM: CHEST - 2 VIEW COMPARISON:  03/31/2022 and 08/18/2019 FINDINGS: The heart size and mediastinal contours are within normal limits. Right-sided Port-A-Cath is seen in appropriate position. Lingular and left lower lobe scarring remains stable. No evidence of acute infiltrate or edema. No evidence of pleural effusion. Lower thoracic and lumbar spine fusion hardware again noted. IMPRESSION: Stable lingular and left lower lobe scarring. No acute findings. Electronically Signed   By: Marlaine Hind M.D.   On: 05/22/2022 10:33   DG Lumbar Spine 2-3 Views  Result Date:  05/09/2022 CLINICAL DATA:  Lumbar fusion. Recent L1 kyphoplasty. EXAM: LUMBAR SPINE - 2-3 VIEW COMPARISON:  Radiographs 03/30/2022 FINDINGS: Stable thoracolumbar fusion hardware without complicating features. Vertebral augmentation changes noted at L1. No acute bony findings. Stable advanced degenerative disc disease at L3-4, L4-5 and L5-S1. Stable degenerative anterolisthesis of L4. IMPRESSION: 1. Stable thoracolumbar fusion hardware without complicating features. 2. Stable advanced degenerative disc disease below the fusion levels. Electronically Signed   By: Marijo Sanes M.D.   On: 05/09/2022 09:44   CT GUIDED NEEDLE PLACEMENT  Result Date: 04/30/2022 CLINICAL DATA:  Briefly, 72 year old female with a history of metastatic lung cancer and symptomatic L1 pathologic vertebral body fracture. EXAM: CT-ASSISTED, FLUOROSCOPIC-GUIDED L1 VERTEBRAL BODY BIOPSY, RADIOFREQUENCY (OsteoCool) ABLATION and KYPHOPLASTY AUGMENTATION COMPARISON:  L-spine XRs 03/30/2022.  CT L-spine, 03/16/2022. MEDICATIONS: Ancef 2 gm IV; The antibiotic was administered in an appropriate time interval prior to needle puncture of the skin. 0.5 % bupivacaine IM within the access level paraspinal muscles. ANESTHESIA/SEDATION: Moderate (conscious) sedation was employed during this procedure. A total of Versed 3 mg and Fentanyl 100 mcg was administered intravenously. Moderate Sedation Time: 72 minutes. The patient's level of consciousness and vital signs were monitored continuously by radiology nursing throughout the procedure under my direct supervision. FLUOROSCOPY TIME:  Fluoroscopic dose; 239 mGy CT dose in mGy was not provided. COMPLICATIONS: None immediate. TECHNIQUE: RADIATION DOSE REDUCTION: This exam was  performed according to the departmental dose-optimization program which includes automated exposure control, adjustment of the mA and/or kV according to patient size and/or use of iterative reconstruction technique. Informed written  consent was obtained from the the patient and/or patient's representative after a thorough discussion of the procedural risks, benefits and alternatives. All questions were addressed. Maximal Sterile Barrier Technique was utilized including caps, mask, sterile gowns, sterile gloves, sterile drape, hand hygiene and skin antiseptic. A timeout was performed prior to the initiation of the procedure. The patient was positioned prone on the CT table and a limited CT was performed for procedural planning demonstrating severely demineralized L1 vertebral body. The procedure was planned. The operative site was prepped and draped in the usual sterile fashion. Appropriate trajectory was confirmed with a 22 gauge spinal needle after the adjacent tissues were anesthetized with 1% lidocaine with epinephrine superficially then deeper with 0.5% bupivacaine. Under intermittent CT guidance, the Kyphon trocar needles was advanced into the LEFT and RIGHT pedicles at L1 vertebral body. Appropriate positioning was confirmed and a bone biopsy sample was obtained with a core needle biopsy device. Using fluoroscopy, once the Kyphon trocar needles were passed the posterior one-third of the vertebral body, the osteo drill was advanced to the anterior third of the vertebral body. The osteo drill were retracted. Through the working cannulas, 15 mm Osetocool RF ablation probes were inserted and positioned under fluoroscopic guidance. With both OsteoCool ablation probes in place, the ablation was performed for 7.5 minutes. Attention was now paid towards the kyphoplasty portion of the procedure. A Kyphon inflatable bone tamp 15 x 3 was advanced through both working cannulas and positioned with the distal marker approximately 5 mm from the anterior aspect of the cortex. Appropriate positioning was confirmed on the AP projection. At this time, the balloon was expanded using contrast via a Kyphon inflation syringe device via micro tubing. Inflations  were continued under direct fluoroscopic guidance. At this time, methylmethacrylate mixture was reconstituted in the Kyphon bone mixing device system. This was then loaded into the delivery mechanism, attached to Kyphon bone fillers. The balloons were deflated and removed followed by the instillation of methylmethacrylate mixture with excellent filling in the AP and lateral projections. The working cannulae and the bone filler were then retrieved and removed. Multiple spot radiographic images were obtained in various obliquities. Hemostasis was achieved with manual compression. A limited postprocedural CT was negative for hemorrhage or additional complication. A dressing was placed. The patient tolerated the procedure well without immediate postprocedural complication. FINDINGS: *Severely demineralized L1 vertebral body, requiring CT for access. *L1 vertebral body biopsy, with samples submitted in formalin. *Adequate cement filling of the L1 vertebral body on both the AP and lateral projections. *No extravasation was noted in the disk spaces or posteriorly into the spinal canal or along the pedicular access. No significant epidural venous contamination was present. IMPRESSION: Successful CT-assisted and fluoroscopic-guided L1 vertebral body biopsy, radiofrequency "OsteoCool" ablation and bi-pedicular cement augmentation with balloon kyphoplasty, as above. PLAN: The patient will return to Vascular Interventional Radiology (VIR) for clinic follow-up in 4 weeks. Michaelle Birks, MD [facility] Radiology Electronically Signed   By: Michaelle Birks M.D.   On: 04/30/2022 17:36   IR KYPHO LUMBAR INC FX REDUCE BONE BX UNI/BIL CANNULATION INC/IMAGING  Result Date: 04/30/2022 CLINICAL DATA:  Briefly, 72 year old female with a history of metastatic lung cancer and symptomatic L1 pathologic vertebral body fracture. EXAM: CT-ASSISTED, FLUOROSCOPIC-GUIDED L1 VERTEBRAL BODY BIOPSY,  RADIOFREQUENCY (OsteoCool)  ABLATION and KYPHOPLASTY AUGMENTATION COMPARISON:  L-spine XRs 03/30/2022.  CT L-spine, 03/16/2022. MEDICATIONS: Ancef 2 gm IV; The antibiotic was administered in an appropriate time interval prior to needle puncture of the skin. 0.5 % bupivacaine IM within the access level paraspinal muscles. ANESTHESIA/SEDATION: Moderate (conscious) sedation was employed during this procedure. A total of Versed 3 mg and Fentanyl 100 mcg was administered intravenously. Moderate Sedation Time: 72 minutes. The patient's level of consciousness and vital signs were monitored continuously by radiology nursing throughout the procedure under my direct supervision. FLUOROSCOPY TIME:  Fluoroscopic dose; 239 mGy CT dose in mGy was not provided. COMPLICATIONS: None immediate. TECHNIQUE: RADIATION DOSE REDUCTION: This exam was performed according to the departmental dose-optimization program which includes automated exposure control, adjustment of the mA and/or kV according to patient size and/or use of iterative reconstruction technique. Informed written consent was obtained from the the patient and/or patient's representative after a thorough discussion of the procedural risks, benefits and alternatives. All questions were addressed. Maximal Sterile Barrier Technique was utilized including caps, mask, sterile gowns, sterile gloves, sterile drape, hand hygiene and skin antiseptic. A timeout was performed prior to the initiation of the procedure. The patient was positioned prone on the CT table and a limited CT was performed for procedural planning demonstrating severely demineralized L1 vertebral body. The procedure was planned. The operative site was prepped and draped in the usual sterile fashion. Appropriate trajectory was confirmed with a 22 gauge spinal needle after the adjacent tissues were anesthetized with 1% lidocaine with epinephrine superficially then deeper with 0.5% bupivacaine. Under intermittent CT  guidance, the Kyphon trocar needles was advanced into the LEFT and RIGHT pedicles at L1 vertebral body. Appropriate positioning was confirmed and a bone biopsy sample was obtained with a core needle biopsy device. Using fluoroscopy, once the Kyphon trocar needles were passed the posterior one-third of the vertebral body, the osteo drill was advanced to the anterior third of the vertebral body. The osteo drill were retracted. Through the working cannulas, 15 mm Osetocool RF ablation probes were inserted and positioned under fluoroscopic guidance. With both OsteoCool ablation probes in place, the ablation was performed for 7.5 minutes. Attention was now paid towards the kyphoplasty portion of the procedure. A Kyphon inflatable bone tamp 15 x 3 was advanced through both working cannulas and positioned with the distal marker approximately 5 mm from the anterior aspect of the cortex. Appropriate positioning was confirmed on the AP projection. At this time, the balloon was expanded using contrast via a Kyphon inflation syringe device via micro tubing. Inflations were continued under direct fluoroscopic guidance. At this time, methylmethacrylate mixture was reconstituted in the Kyphon bone mixing device system. This was then loaded into the delivery mechanism, attached to Kyphon bone fillers. The balloons were deflated and removed followed by the instillation of methylmethacrylate mixture with excellent filling in the AP and lateral projections. The working cannulae and the bone filler were then retrieved and removed. Multiple spot radiographic images were obtained in various obliquities. Hemostasis was achieved with manual compression. A limited postprocedural CT was negative for hemorrhage or additional complication. A dressing was placed. The patient tolerated the procedure well without immediate postprocedural complication. FINDINGS: *Severely demineralized L1 vertebral body, requiring CT for access. *L1 vertebral body  biopsy, with samples submitted in formalin. *Adequate cement filling of the L1 vertebral body on both the AP and lateral projections. *No extravasation was noted in the disk spaces or posteriorly into the spinal canal or along the pedicular  access. No significant epidural venous contamination was present. IMPRESSION: Successful CT-assisted and fluoroscopic-guided L1 vertebral body biopsy, radiofrequency "OsteoCool" ablation and bi-pedicular cement augmentation with balloon kyphoplasty, as above. PLAN: The patient will return to Vascular Interventional Radiology (VIR) for clinic follow-up in 4 weeks. Michaelle Birks, MD [facility] Radiology Electronically Signed   By: Michaelle Birks M.D.   On: 04/30/2022 17:36   IR Bone Tumor(s)RF Ablation  Result Date: 04/30/2022 CLINICAL DATA:  Briefly, 72 year old female with a history of metastatic lung cancer and symptomatic L1 pathologic vertebral body fracture. EXAM: CT-ASSISTED, FLUOROSCOPIC-GUIDED L1 VERTEBRAL BODY BIOPSY, RADIOFREQUENCY (OsteoCool) ABLATION and KYPHOPLASTY AUGMENTATION COMPARISON:  L-spine XRs 03/30/2022.  CT L-spine, 03/16/2022. MEDICATIONS: Ancef 2 gm IV; The antibiotic was administered in an appropriate time interval prior to needle puncture of the skin. 0.5 % bupivacaine IM within the access level paraspinal muscles. ANESTHESIA/SEDATION: Moderate (conscious) sedation was employed during this procedure. A total of Versed 3 mg and Fentanyl 100 mcg was administered intravenously. Moderate Sedation Time: 72 minutes. The patient's level of consciousness and vital signs were monitored continuously by radiology nursing throughout the procedure under my direct supervision. FLUOROSCOPY TIME:  Fluoroscopic dose; 239 mGy CT dose in mGy was not provided. COMPLICATIONS: None immediate. TECHNIQUE: RADIATION DOSE REDUCTION: This exam was performed according to the departmental dose-optimization program which includes  automated exposure control, adjustment of the mA and/or kV according to patient size and/or use of iterative reconstruction technique. Informed written consent was obtained from the the patient and/or patient's representative after a thorough discussion of the procedural risks, benefits and alternatives. All questions were addressed. Maximal Sterile Barrier Technique was utilized including caps, mask, sterile gowns, sterile gloves, sterile drape, hand hygiene and skin antiseptic. A timeout was performed prior to the initiation of the procedure. The patient was positioned prone on the CT table and a limited CT was performed for procedural planning demonstrating severely demineralized L1 vertebral body. The procedure was planned. The operative site was prepped and draped in the usual sterile fashion. Appropriate trajectory was confirmed with a 22 gauge spinal needle after the adjacent tissues were anesthetized with 1% lidocaine with epinephrine superficially then deeper with 0.5% bupivacaine. Under intermittent CT guidance, the Kyphon trocar needles was advanced into the LEFT and RIGHT pedicles at L1 vertebral body. Appropriate positioning was confirmed and a bone biopsy sample was obtained with a core needle biopsy device. Using fluoroscopy, once the Kyphon trocar needles were passed the posterior one-third of the vertebral body, the osteo drill was advanced to the anterior third of the vertebral body. The osteo drill were retracted. Through the working cannulas, 15 mm Osetocool RF ablation probes were inserted and positioned under fluoroscopic guidance. With both OsteoCool ablation probes in place, the ablation was performed for 7.5 minutes. Attention was now paid towards the kyphoplasty portion of the procedure. A Kyphon inflatable bone tamp 15 x 3 was advanced through both working cannulas and positioned with the distal marker approximately 5 mm from the anterior aspect of the cortex. Appropriate positioning was  confirmed on the AP projection. At this time, the balloon was expanded using contrast via a Kyphon inflation syringe device via micro tubing. Inflations were continued under direct fluoroscopic guidance. At this time, methylmethacrylate mixture was reconstituted in the Kyphon bone mixing device system. This was then loaded into the delivery mechanism, attached to Kyphon bone fillers. The balloons were deflated and removed followed by the instillation of methylmethacrylate mixture with excellent filling in the  AP and lateral projections. The working cannulae and the bone filler were then retrieved and removed. Multiple spot radiographic images were obtained in various obliquities. Hemostasis was achieved with manual compression. A limited postprocedural CT was negative for hemorrhage or additional complication. A dressing was placed. The patient tolerated the procedure well without immediate postprocedural complication. FINDINGS: *Severely demineralized L1 vertebral body, requiring CT for access. *L1 vertebral body biopsy, with samples submitted in formalin. *Adequate cement filling of the L1 vertebral body on both the AP and lateral projections. *No extravasation was noted in the disk spaces or posteriorly into the spinal canal or along the pedicular access. No significant epidural venous contamination was present. IMPRESSION: Successful CT-assisted and fluoroscopic-guided L1 vertebral body biopsy, radiofrequency "OsteoCool" ablation and bi-pedicular cement augmentation with balloon kyphoplasty, as above. PLAN: The patient will return to Vascular Interventional Radiology (VIR) for clinic follow-up in 4 weeks. Michaelle Birks, MD [facility] Electronically Signed   By: Michaelle Birks M.D.   On: 04/30/2022 17:36     Assessment and plan- Patient is a 72 y.o. female with metastatic lung cancer with bone metastases.  She is here for on treatment assessment prior to  cycle 3 of carbo Alimta Keytruda chemotherapy  Counts okay to proceed with cycle 3 of carbo Alimta Keytruda chemotherapy today.  She will be seen by covering NP in 3 weeks for cycle 4.  Plan to repeat CT chest abdomen pelvis with contrast and bone scan in 5 weeks.  I will see her back in 6 weeks and if scans overall shows stable disease/partial response she will continue with Alimta and Keytruda chemotherapy every 3 weeks without carboplatin until progression or toxicity.  Iron deficiency anemia: Dose 4 of Venofer today and dose 5 in 3 weeks.  Segmental pulmonary embolism continue Eliquis.  Neoplasm related pain: Continue as needed oxycodone and Xtampza.  Bone metastases: Patient was supposed to start Zometa today and did receive dental clearance for the same.  However patient is going to [place] in the next couple of weeks and is concerned about possible flulike symptoms that can happen with Zometa.  We are therefore deferring Zometa to start now in 3 weeks time   Visit Diagnosis 1. Primary malignant neoplasm of right lung metastatic to other site [facility])   2. Encounter for antineoplastic chemotherapy   3. Encounter for antineoplastic immunotherapy      Dr. Randa Evens, MD, MPH [facility] [phone] 05/28/2022 1:03 PM

## 2022-05-28 NOTE — Progress Notes (Signed)
Nutrition Follow-up:  Patient with lung cancer with bone metastases.  Patient receiving carboplatin, keytruda and alimta.   Met with patient and sister during infusion. Reports that she has purchased Boost [facility] and ensure and drinking some of those shakes.  Trying to eat as much as she can to increase weight.  EGD planned for 2/26.  Reports that if she eats too much will vomit food back up.      Medications: reviewed  Labs: reviewed  Anthropometrics:   Weight 118 lb 8 oz today  111 lb 12/8 oz on 1/25 119 lb 3.2 oz on 12/14 130 lb on 02/10/22   NUTRITION DIAGNOSIS: Inadequate oral intake improving    INTERVENTION:  Continue boost VHC and ensure complete shakes for more calories and protein Continue small frequent meals/snacks with high calories and high protein     MONITORING, EVALUATION, GOAL: weight trends, intake   NEXT VISIT: Friday, March 8 during infusion  Breccan Galant B. Zenia Resides, [place], [place] Registered Dietitian [phone]

## 2022-05-29 LAB — T4: T4, Total: 7.8 ug/dL (ref 4.5–12.0)

## 2022-06-04 ENCOUNTER — Encounter: Payer: Self-pay | Admitting: *Deleted

## 2022-06-05 ENCOUNTER — Other Ambulatory Visit: Payer: Self-pay | Admitting: Oncology

## 2022-06-05 DIAGNOSIS — C3491 Malignant neoplasm of unspecified part of right bronchus or lung: Secondary | ICD-10-CM

## 2022-06-07 ENCOUNTER — Ambulatory Visit: Payer: Medicare HMO | Admitting: Anesthesiology

## 2022-06-07 ENCOUNTER — Encounter
Admission: RE | Disposition: A | Discharge status: Home / Self Care | Payer: Self-pay | Source: Home / Self Care | Attending: Gastroenterology

## 2022-06-07 ENCOUNTER — Ambulatory Visit
Admission: RE | Admit: 2022-06-07 | Discharge: 2022-06-07 | Disposition: A | Discharge status: Home / Self Care | Payer: Medicare HMO | Attending: Gastroenterology | Admitting: Gastroenterology

## 2022-06-07 ENCOUNTER — Encounter: Payer: Self-pay | Admitting: *Deleted

## 2022-06-07 DIAGNOSIS — K219 Gastro-esophageal reflux disease without esophagitis: Secondary | ICD-10-CM | POA: Insufficient documentation

## 2022-06-07 DIAGNOSIS — Z7901 Long term (current) use of anticoagulants: Secondary | ICD-10-CM | POA: Diagnosis not present

## 2022-06-07 DIAGNOSIS — R1310 Dysphagia, unspecified: Secondary | ICD-10-CM | POA: Insufficient documentation

## 2022-06-07 DIAGNOSIS — M1990 Unspecified osteoarthritis, unspecified site: Secondary | ICD-10-CM | POA: Insufficient documentation

## 2022-06-07 DIAGNOSIS — Z87891 Personal history of nicotine dependence: Secondary | ICD-10-CM | POA: Insufficient documentation

## 2022-06-07 DIAGNOSIS — I10 Essential (primary) hypertension: Secondary | ICD-10-CM | POA: Insufficient documentation

## 2022-06-07 DIAGNOSIS — C799 Secondary malignant neoplasm of unspecified site: Secondary | ICD-10-CM | POA: Insufficient documentation

## 2022-06-07 DIAGNOSIS — C3490 Malignant neoplasm of unspecified part of unspecified bronchus or lung: Secondary | ICD-10-CM | POA: Insufficient documentation

## 2022-06-07 DIAGNOSIS — Z7984 Long term (current) use of oral hypoglycemic drugs: Secondary | ICD-10-CM | POA: Diagnosis not present

## 2022-06-07 DIAGNOSIS — E119 Type 2 diabetes mellitus without complications: Secondary | ICD-10-CM | POA: Insufficient documentation

## 2022-06-07 HISTORY — PX: ESOPHAGOGASTRODUODENOSCOPY (EGD) WITH PROPOFOL: SHX5813

## 2022-06-07 LAB — GLUCOSE, CAPILLARY: Glucose-Capillary: 112 mg/dL — ABNORMAL HIGH (ref 70–99)

## 2022-06-07 SURGERY — ESOPHAGOGASTRODUODENOSCOPY (EGD) WITH PROPOFOL
Anesthesia: General

## 2022-06-07 MED ORDER — PROPOFOL 10 MG/ML IV BOLUS
INTRAVENOUS | Status: DC | PRN
  Administered 2022-06-07: 80 mg via INTRAVENOUS

## 2022-06-07 MED ORDER — PROPOFOL 500 MG/50ML IV EMUL
INTRAVENOUS | Status: DC | PRN
  Administered 2022-06-07: 150 ug/kg/min via INTRAVENOUS

## 2022-06-07 MED ORDER — LIDOCAINE HCL (CARDIAC) PF 100 MG/5ML IV SOSY
PREFILLED_SYRINGE | INTRAVENOUS | Status: DC | PRN
  Administered 2022-06-07: 50 mg via INTRAVENOUS

## 2022-06-07 MED ORDER — SODIUM CHLORIDE 0.9 % IV SOLN: INTRAVENOUS | Status: DC

## 2022-06-07 NOTE — Interval H&P Note (Signed)
History and Physical Interval Note:  06/07/2022 1:31 PM  Amber Glenn  has presented today for surgery, with the diagnosis of Dysphagia.  The various methods of treatment have been discussed with the patient and family. After consideration of risks, benefits and other options for treatment, the patient has consented to  Procedure(s): ESOPHAGOGASTRODUODENOSCOPY (EGD) WITH PROPOFOL (N/A) as a surgical intervention.  The patient's history has been reviewed, patient examined, no change in status, stable for surgery.  I have reviewed the patient's chart and labs.  Questions were answered to the patient's satisfaction.     Lesly Rubenstein  Ok to proceed with EGD

## 2022-06-07 NOTE — H&P (Signed)
Outpatient short stay form Pre-procedure 06/07/2022  Lesly Rubenstein, MD  Primary Physician: Lynnell Jude, MD  Reason for visit:  Dysphagia  History of present illness:    72 y/o lady with metastatic adenocarcinoma of lung here for EGD for dysphagia that started after mediastinal biopsy along with hoarseness. Takes eliquis with last dose two days ago. History of hypertension.    Current Facility-Administered Medications:    0.9 %  sodium chloride infusion, , Intravenous, Continuous, Maan Zarcone, Hilton Cork, MD, Last Rate: 20 mL/hr at 06/07/22 1248, Restarted at 06/07/22 1304  Medications Prior to Admission  Medication Sig Dispense Refill Last Dose   acetaminophen (TYLENOL) 500 MG tablet Take 500 mg by mouth every 6 (six) hours as needed for moderate pain, mild pain, fever or headache.   Past Week   apixaban (ELIQUIS) 5 MG TABS tablet Take 1 tablet (5 mg total) by mouth 2 (two) times daily. 60 tablet 2 Past Week   Ascorbic Acid (VITAMIN C) 1000 MG tablet Take 1,000 mg by mouth daily.     Past Week   carvedilol (COREG) 3.125 MG tablet Take 3.125 mg by mouth 2 (two) times daily with a meal.   06/07/2022 at 1100   cyclobenzaprine (FLEXERIL) 5 MG tablet Take 1 tablet (5 mg total) by mouth 3 (three) times daily as needed for muscle spasms. 30 tablet 0 Past Week   dexamethasone (DECADRON) 4 MG tablet TAKE 1 TABLET 2 TIMES DAILY STARTING DAY BEFORE PEMETREXED. THEN TAKE 2 TABS DAILY FOR 3 DAYS STARTING DAY AFTER CARBOPLATIN. TAKE WITH FOOD. 30 tablet 1 Past Week   Docusate Calcium (STOOL SOFTENER PO) Take 1 capsule by mouth daily as needed (constipation).   Past Week   folic acid (FOLVITE) 1 MG tablet Take 1 tablet (1 mg total) by mouth daily. Start 7 days before pemetrexed chemotherapy. Continue until 21 days after pemetrexed completed. 100 tablet 3 Past Week   gabapentin (NEURONTIN) 300 MG capsule Take 1 capsule (300 mg total) by mouth 3 (three) times daily. 90 capsule 2 Past Week    lidocaine-prilocaine (EMLA) cream Apply 1 Application topically as needed. 30 g 2 06/07/2022 at 1100   LORazepam (ATIVAN) 0.5 MG tablet Take 1 tablet (0.5 mg total) by mouth every 8 (eight) hours as needed for anxiety. 30 tablet 0 Past Week   losartan (COZAAR) 100 MG tablet Take 100 mg by mouth daily.   06/06/2022   metFORMIN (GLUCOPHAGE) 500 MG tablet Take by mouth 2 (two) times daily with a meal.   06/06/2022   metoprolol tartrate (LOPRESSOR) 25 MG tablet Take 1 tablet (25 mg total) by mouth 2 (two) times daily. 60 tablet 0 Past Week   Multiple Vitamin (MULTIVITAMIN) capsule Take 1 capsule by mouth daily.     Past Week   omeprazole (PRILOSEC) 20 MG capsule Take 1 capsule (20 mg total) by mouth daily. 90 capsule 3 Past Week   ondansetron (ZOFRAN) 8 MG tablet Take 1 tablet (8 mg total) by mouth every 8 (eight) hours as needed for nausea or vomiting. Start on the third day after carboplatin. 30 tablet 1 06/06/2022   oxyCODONE ER (XTAMPZA ER) 18 MG C12A Take 1 capsule by mouth every 12 (twelve) hours. 60 capsule 0 06/07/2022 at 1100   Oxycodone HCl 20 MG TABS Take 0.5-1 tablets (10-20 mg total) by mouth every 4 (four) hours as needed. 60 tablet 0 06/06/2022   polyethylene glycol (MIRALAX / GLYCOLAX) 17 g packet Take 17 g by mouth  daily. 14 each 0 Past Month   prednisoLONE acetate (PRED MILD) 0.12 % ophthalmic suspension Place 1 drop into both eyes daily.   06/07/2022 at 0900   prochlorperazine (COMPAZINE) 10 MG tablet Take 1 tablet (10 mg total) by mouth every 6 (six) hours as needed for nausea or vomiting. 30 tablet 2 06/06/2022   simvastatin (ZOCOR) 40 MG tablet TAKE 1 TABLET EVERY DAY  AT  6:00 PM 90 tablet 3 06/06/2022     No Known Allergies   Past Medical History:  Diagnosis Date   Arthritis    hands and feet   Cancer associated pain    Cataract    right   Complication of anesthesia    sometimes have a hard time waking up   Diabetes mellitus    Borderline Diabetes   GERD (gastroesophageal  reflux disease)    HTN (hypertension)    Hyperlipidemia    Motion sickness    Post corneal transplant    right    Review of systems:  Otherwise negative.    Physical Exam  Gen: Alert, oriented. Appears stated age.  HEENT: PERRLA. Lungs: No respiratory distress CV: RRR Abd: soft, benign, no masses Ext: No edema    Planned procedures: Proceed with EGD. The patient understands the nature of the planned procedure, indications, risks, alternatives and potential complications including but not limited to bleeding, infection, perforation, damage to internal organs and possible oversedation/side effects from anesthesia. The patient agrees and gives consent to proceed.  Please refer to procedure notes for findings, recommendations and patient disposition/instructions.     Lesly Rubenstein, MD [facility] Gastroenterology

## 2022-06-07 NOTE — Anesthesia Preprocedure Evaluation (Addendum)
Anesthesia Evaluation  Patient identified by MRN, date of birth, ID band Patient awake    Reviewed: Allergy & Precautions, NPO status , Patient's Chart, lab work & pertinent test results  History of Anesthesia Complications (+) DIFFICULT AIRWAY, Emergence Delirium and history of anesthetic complications (123456: grade II view iwth McGraph 4; 6.5 ETT placed first attempt; friable tissue with deviated trachea)  Airway Mallampati: IV   Neck ROM: Full    Dental no notable dental hx.    Pulmonary former smoker (quit 2010) Lung CA   Pulmonary exam normal breath sounds clear to auscultation       Cardiovascular hypertension, Normal cardiovascular exam Rhythm:Regular Rate:Normal  ECG 03/31/22: ST (HR 125) with occasional PVCs; anterolateral infarct, age undetermined  Echo 11/23/21:  NORMAL LEFT VENTRICULAR SYSTOLIC FUNCTION  NORMAL RIGHT VENTRICULAR SYSTOLIC FUNCTION  MILD VALVULAR REGURGITATION  NO VALVULAR STENOSIS    Neuro/Psych negative neurological ROS     GI/Hepatic ,GERD  ,,  Endo/Other  diabetes, Type 2    Renal/GU negative Renal ROS     Musculoskeletal  (+) Arthritis ,    Abdominal   Peds  Hematology  (+) Blood dyscrasia, anemia   Anesthesia Other Findings   Reproductive/Obstetrics                             Anesthesia Physical Anesthesia Plan  ASA: 3  Anesthesia Plan: General   Post-op Pain Management:    Induction: Intravenous  PONV Risk Score and Plan: 3 and Propofol infusion, TIVA and Treatment may vary due to age or medical condition  Airway Management Planned: Natural Airway  Additional Equipment:   Intra-op Plan:   Post-operative Plan:   Informed Consent: I have reviewed the patients History and Physical, chart, labs and discussed the procedure including the risks, benefits and alternatives for the proposed anesthesia with the patient or authorized  representative who has indicated his/her understanding and acceptance.       Plan Discussed with: CRNA  Anesthesia Plan Comments: (LMA/GETA backup discussed.  Patient consented for risks of anesthesia including but not limited to:  - adverse reactions to medications - damage to eyes, teeth, lips or other oral mucosa - nerve damage due to positioning  - sore throat or hoarseness - damage to heart, brain, nerves, lungs, other parts of body or loss of life  Informed patient about role of CRNA in peri- and intra-operative care.  Patient voiced understanding.)        Anesthesia Quick Evaluation

## 2022-06-07 NOTE — Op Note (Signed)
[facility] Gastroenterology Patient Name: Amber Glenn Procedure Date: 06/07/2022 1:31 PM MRN: [id number] Account #: [id number] Date of Birth: Nov 18, 1950 Admit Type: Outpatient Age: 72 Room: [facility] ENDO ROOM 3 Gender: Female Note Status: Finalized Instrument Name: Upper Endoscope [id number] Procedure:             Upper GI endoscopy Indications:           Dysphagia Providers:             Andrey Farmer MD, MD Referring MD:          Lynnell Jude (Referring MD) Medicines:             Monitored Anesthesia Care Complications:         No immediate complications. Estimated blood loss:                         Minimal. Procedure:             Pre-Anesthesia Assessment:                        - Prior to the procedure, a History and Physical was                         performed, and patient medications and allergies were                         reviewed. The patient is competent. The risks and                         benefits of the procedure and the sedation options and                         risks were discussed with the patient. All questions                         were answered and informed consent was obtained.                         Patient identification and proposed procedure were                         verified by the physician, the nurse, the                         anesthesiologist, the anesthetist and the technician                         in the endoscopy suite. Mental Status Examination:                         alert and oriented. Airway Examination: normal                         oropharyngeal airway and neck mobility. Respiratory                         Examination: clear to auscultation. CV Examination:  normal. Prophylactic Antibiotics: The patient does not                         require prophylactic antibiotics. Prior                         Anticoagulants: The patient has taken no anticoagulant                         or  antiplatelet agents. ASA Grade Assessment: III - A                         patient with severe systemic disease. After reviewing                         the risks and benefits, the patient was deemed in                         satisfactory condition to undergo the procedure. The                         anesthesia plan was to use monitored anesthesia care                         (MAC). Immediately prior to administration of                         medications, the patient was re-assessed for adequacy                         to receive sedatives. The heart rate, respiratory                         rate, oxygen saturations, blood pressure, adequacy of                         pulmonary ventilation, and response to care were                         monitored throughout the procedure. The physical                         status of the patient was re-assessed after the                         procedure.                        After obtaining informed consent, the endoscope was                         passed under direct vision. Throughout the procedure,                         the patient's blood pressure, pulse, and oxygen                         saturations were monitored continuously. The  Endosonoscope was introduced through the mouth, and                         advanced to the second part of duodenum. The upper GI                         endoscopy was accomplished without difficulty. The                         patient tolerated the procedure well. Findings:      No endoscopic abnormality was evident in the esophagus to explain the       patient's complaint of dysphagia. Biopsies were obtained from the       proximal and distal esophagus with cold forceps for histology of       suspected eosinophilic esophagitis. Estimated blood loss was minimal.      The exam of the esophagus was otherwise normal.      The entire examined stomach was normal.      The examined  duodenum was normal. Impression:            - No endoscopic esophageal abnormality to explain                         patient's dysphagia.                        - Normal stomach.                        - Normal examined duodenum.                        - Biopsies were taken with a cold forceps for                         evaluation of eosinophilic esophagitis. Recommendation:        - Discharge patient to home.                        - Resume previous diet.                        - Continue present medications.                        - Await pathology results.                        - Return to referring physician as previously                         scheduled. Procedure Code(s):     --- Professional ---                        [phone], Esophagogastroduodenoscopy, flexible,                         transoral; with biopsy, single or multiple Diagnosis Code(s):     --- Professional ---  R13.10, Dysphagia, unspecified CPT copyright 2022 American Medical Association. All rights reserved. The codes documented in this report are preliminary and upon coder review may  be revised to meet current compliance requirements. Andrey Farmer MD, MD 06/07/2022 1:48:32 PM Number of Addenda: 0 Note Initiated On: 06/07/2022 1:31 PM Estimated Blood Loss:  Estimated blood loss was minimal.      [facility]

## 2022-06-07 NOTE — Anesthesia Postprocedure Evaluation (Signed)
Anesthesia Post Note  Patient: Amber Glenn  Procedure(s) Performed: ESOPHAGOGASTRODUODENOSCOPY (EGD) WITH PROPOFOL  Patient location during evaluation: PACU Anesthesia Type: General Level of consciousness: awake and alert, oriented and patient cooperative Pain management: pain level controlled Vital Signs Assessment: post-procedure vital signs reviewed and stable Respiratory status: spontaneous breathing, nonlabored ventilation and respiratory function stable Cardiovascular status: blood pressure returned to baseline and stable Postop Assessment: adequate PO intake Anesthetic complications: no   No notable events documented.   Last Vitals:  Vitals:   06/07/22 1346 06/07/22 1405  BP: 116/62 (!) 144/70  Pulse: (!) 104   Resp:    Temp:    SpO2: 100%     Last Pain:  Vitals:   06/07/22 1405  TempSrc:   PainSc: 0-No pain                 Darrin Nipper

## 2022-06-07 NOTE — Transfer of Care (Signed)
Immediate Anesthesia Transfer of Care Note  Patient: Amber Glenn  Procedure(s) Performed: ESOPHAGOGASTRODUODENOSCOPY (EGD) WITH PROPOFOL  Patient Location: Endoscopy Unit  Anesthesia Type:General  Level of Consciousness:  awake and drowsy Airway & Oxygen Therapy: Patient Spontanous Breathing  Post-op Assessment: Report given to RN and Post -op Vital signs reviewed and stable  Post vital signs: Reviewed and stable  Last Vitals:  Vitals Value Taken Time  BP 116/62 06/07/22 1346  Temp    Pulse 104 06/07/22 1346  Resp    SpO2 100 % 06/07/22 1346    Last Pain:  Vitals:   06/07/22 1235  TempSrc: Tympanic  PainSc: 6          Complications: No notable events documented.

## 2022-06-07 NOTE — Anesthesia Procedure Notes (Signed)
Date/Time: 06/07/2022 1:33 PM  Performed by: Johnna Acosta, CRNAPre-anesthesia Checklist: Patient identified, Emergency Drugs available, Patient being monitored, Suction available and Timeout performed Patient Re-evaluated:Patient Re-evaluated prior to induction Oxygen Delivery Method: Nasal cannula Preoxygenation: Pre-oxygenation with 100% oxygen Induction Type: IV induction

## 2022-06-08 ENCOUNTER — Encounter: Payer: Self-pay | Admitting: Gastroenterology

## 2022-06-08 LAB — SURGICAL PATHOLOGY

## 2022-06-11 ENCOUNTER — Other Ambulatory Visit: Payer: Self-pay | Admitting: Gastroenterology

## 2022-06-11 DIAGNOSIS — R1312 Dysphagia, oropharyngeal phase: Secondary | ICD-10-CM

## 2022-06-16 ENCOUNTER — Other Ambulatory Visit: Payer: Self-pay | Admitting: *Deleted

## 2022-06-16 MED ORDER — OXYCODONE HCL 20 MG PO TABS: 10.0000 mg | ORAL_TABLET | ORAL | 0 refills | Status: DC | PRN

## 2022-06-17 MED FILL — Fosaprepitant Dimeglumine For IV Infusion 150 MG (Base Eq): INTRAVENOUS | Qty: 5 | Status: AC

## 2022-06-17 MED FILL — Dexamethasone Sodium Phosphate Inj 100 MG/10ML: INTRAMUSCULAR | Qty: 1 | Status: AC

## 2022-06-18 ENCOUNTER — Inpatient Hospital Stay: Payer: Medicare HMO | Attending: Oncology

## 2022-06-18 ENCOUNTER — Inpatient Hospital Stay (HOSPITAL_BASED_OUTPATIENT_CLINIC_OR_DEPARTMENT_OTHER): Payer: Medicare HMO | Admitting: Medical Oncology

## 2022-06-18 ENCOUNTER — Encounter: Payer: Self-pay | Admitting: Medical Oncology

## 2022-06-18 ENCOUNTER — Inpatient Hospital Stay: Payer: Medicare HMO

## 2022-06-18 ENCOUNTER — Other Ambulatory Visit: Payer: Self-pay

## 2022-06-18 ENCOUNTER — Ambulatory Visit
Admission: RE | Admit: 2022-06-18 | Discharge: 2022-06-18 | Disposition: A | Discharge status: Home / Self Care | Payer: Medicare HMO | Source: Ambulatory Visit | Attending: Interventional Radiology | Admitting: Interventional Radiology

## 2022-06-18 VITALS — BP 122/63 | HR 92 | Temp 99.8°F | Resp 18 | Ht 60.0 in | Wt 116.5 lb

## 2022-06-18 DIAGNOSIS — C3491 Malignant neoplasm of unspecified part of right bronchus or lung: Secondary | ICD-10-CM

## 2022-06-18 DIAGNOSIS — C7951 Secondary malignant neoplasm of bone: Secondary | ICD-10-CM | POA: Diagnosis not present

## 2022-06-18 DIAGNOSIS — Z7901 Long term (current) use of anticoagulants: Secondary | ICD-10-CM | POA: Insufficient documentation

## 2022-06-18 DIAGNOSIS — D508 Other iron deficiency anemias: Secondary | ICD-10-CM

## 2022-06-18 DIAGNOSIS — Z5111 Encounter for antineoplastic chemotherapy: Secondary | ICD-10-CM | POA: Diagnosis present

## 2022-06-18 DIAGNOSIS — D509 Iron deficiency anemia, unspecified: Secondary | ICD-10-CM | POA: Insufficient documentation

## 2022-06-18 DIAGNOSIS — I2699 Other pulmonary embolism without acute cor pulmonale: Secondary | ICD-10-CM | POA: Insufficient documentation

## 2022-06-18 DIAGNOSIS — G893 Neoplasm related pain (acute) (chronic): Secondary | ICD-10-CM | POA: Diagnosis not present

## 2022-06-18 DIAGNOSIS — R1310 Dysphagia, unspecified: Secondary | ICD-10-CM

## 2022-06-18 DIAGNOSIS — Z981 Arthrodesis status: Secondary | ICD-10-CM

## 2022-06-18 DIAGNOSIS — Z5112 Encounter for antineoplastic immunotherapy: Secondary | ICD-10-CM | POA: Diagnosis not present

## 2022-06-18 LAB — CBC WITH DIFFERENTIAL/PLATELET
Abs Immature Granulocytes: 0.04 10*3/uL (ref 0.00–0.07)
Basophils Absolute: 0 10*3/uL (ref 0.0–0.1)
Basophils Relative: 0 %
Eosinophils Absolute: 0 10*3/uL (ref 0.0–0.5)
Eosinophils Relative: 0 %
HCT: 34.5 % — ABNORMAL LOW (ref 36.0–46.0)
Hemoglobin: 11.1 g/dL — ABNORMAL LOW (ref 12.0–15.0)
Immature Granulocytes: 1 %
Lymphocytes Relative: 19 %
Lymphs Abs: 0.9 10*3/uL (ref 0.7–4.0)
MCH: 29.4 pg (ref 26.0–34.0)
MCHC: 32.2 g/dL (ref 30.0–36.0)
MCV: 91.5 fL (ref 80.0–100.0)
Monocytes Absolute: 0.8 10*3/uL (ref 0.1–1.0)
Monocytes Relative: 17 %
Neutro Abs: 2.9 10*3/uL (ref 1.7–7.7)
Neutrophils Relative %: 63 %
Platelets: 223 10*3/uL (ref 150–400)
RBC: 3.77 MIL/uL — ABNORMAL LOW (ref 3.87–5.11)
RDW: 21 % — ABNORMAL HIGH (ref 11.5–15.5)
WBC: 4.6 10*3/uL (ref 4.0–10.5)
nRBC: 0 % (ref 0.0–0.2)

## 2022-06-18 LAB — COMPREHENSIVE METABOLIC PANEL
ALT: 21 U/L (ref 0–44)
AST: 29 U/L (ref 15–41)
Albumin: 3.9 g/dL (ref 3.5–5.0)
Alkaline Phosphatase: 76 U/L (ref 38–126)
Anion gap: 10 (ref 5–15)
BUN: 19 mg/dL (ref 8–23)
CO2: 26 mmol/L (ref 22–32)
Calcium: 9.5 mg/dL (ref 8.9–10.3)
Chloride: 98 mmol/L (ref 98–111)
Creatinine, Ser: 0.77 mg/dL (ref 0.44–1.00)
GFR, Estimated: >60 mL/min (ref >60)
Glucose, Bld: 134 mg/dL — ABNORMAL HIGH (ref 70–99)
Potassium: 4 mmol/L (ref 3.5–5.1)
Sodium: 134 mmol/L — ABNORMAL LOW (ref 135–145)
Total Bilirubin: 0.2 mg/dL — ABNORMAL LOW (ref 0.3–1.2)
Total Protein: 7.4 g/dL (ref 6.5–8.1)

## 2022-06-18 MED ORDER — SODIUM CHLORIDE 0.9 % IV SOLN
150.0000 mg | Freq: Once | INTRAVENOUS | Status: AC
  Administered 2022-06-18: 150 mg via INTRAVENOUS
  Filled 2022-06-18: qty 150

## 2022-06-18 MED ORDER — HEPARIN SOD (PORK) LOCK FLUSH 100 UNIT/ML IV SOLN
500.0000 [IU] | Freq: Once | INTRAVENOUS | Status: AC | PRN
  Administered 2022-06-18: 500 [IU]
  Filled 2022-06-18: qty 5

## 2022-06-18 MED ORDER — PALONOSETRON HCL INJECTION 0.25 MG/5ML
0.2500 mg | Freq: Once | INTRAVENOUS | Status: AC
  Administered 2022-06-18: 0.25 mg via INTRAVENOUS
  Filled 2022-06-18: qty 5

## 2022-06-18 MED ORDER — SODIUM CHLORIDE 0.9 % IV SOLN
10.0000 mg | Freq: Once | INTRAVENOUS | Status: AC
  Administered 2022-06-18: 10 mg via INTRAVENOUS
  Filled 2022-06-18: qty 10

## 2022-06-18 MED ORDER — SODIUM CHLORIDE 0.9 % IV SOLN
500.0000 mg/m2 | Freq: Once | INTRAVENOUS | Status: AC
  Administered 2022-06-18: 800 mg via INTRAVENOUS
  Filled 2022-06-18: qty 32

## 2022-06-18 MED ORDER — SODIUM CHLORIDE 0.9 % IV SOLN
Freq: Once | INTRAVENOUS | Status: AC
  Filled 2022-06-18: qty 250

## 2022-06-18 MED ORDER — SODIUM CHLORIDE 0.9 % IV SOLN
200.0000 mg | INTRAVENOUS | Status: DC
  Administered 2022-06-18: 200 mg via INTRAVENOUS
  Filled 2022-06-18: qty 200

## 2022-06-18 MED ORDER — ZOLEDRONIC ACID 4 MG/100ML IV SOLN
4.0000 mg | INTRAVENOUS | Status: DC
  Administered 2022-06-18: 4 mg via INTRAVENOUS
  Filled 2022-06-18: qty 100

## 2022-06-18 MED ORDER — SODIUM CHLORIDE 0.9 % IV SOLN
200.0000 mg | Freq: Once | INTRAVENOUS | Status: AC
  Administered 2022-06-18: 200 mg via INTRAVENOUS
  Filled 2022-06-18: qty 8

## 2022-06-18 MED ORDER — SODIUM CHLORIDE 0.9 % IV SOLN
338.5000 mg | Freq: Once | INTRAVENOUS | Status: AC
  Administered 2022-06-18: 350 mg via INTRAVENOUS
  Filled 2022-06-18: qty 35

## 2022-06-18 NOTE — Patient Instructions (Signed)
Call Marcie Bal in [place] [phone] to discuss your speech therapy apts. Call Dr. Drinda Butts office to follow-up on your referral for 'hoarseness of voice.'

## 2022-06-18 NOTE — Progress Notes (Signed)
Nutrition Follow-up:  Patient with lung cancer with bone metastases.  Patient receiving carboplatin, keytruda and alimta.    Met with patient and sister during infusion.  Reports that the EGD did not show anything. Reports random episodes of nausea and vomiting.  Medication helps some with nausea.  Has been out of chocolate boost VHC shake but has been ordered.  Has been able to eat peanut butter and jelly sandwich, biscuit and gravy, oatmeal, soups, mashed potatoes with sour cream.  Continues to eat as much as she can to increase her calories.     Medications: reviewed  Labs: reviewed  Anthropometrics:   Weight 116 lb 8 oz on 3/8 118 lb 8 oz on 2/16 111 lb 12.8 oz on 12/14 130 lb on 02/10/22   NUTRITION DIAGNOSIS: Inadequate oral intake stable    INTERVENTION:  Continue strategies to increase calories and protein Patient will call for appointment with SLP Continue boost [facility] for added calories and protein    MONITORING, EVALUATION, GOAL: weight trends, intake   NEXT VISIT: ~ 6 weeks with treatment  Karrah Mangini B. Zenia Resides, [place], [place] Registered Dietitian [phone]

## 2022-06-18 NOTE — Progress Notes (Signed)
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  She smoked about 2 packs of cigarettes per day but quit smoking in 2010.  She presented to the ER withSymptoms of significant back pain MRIs lumbar spine without contrast showed pathologic fracture of the L1 vertebral body with posterior bulging of the cortex resulting in mild spinal canal stenosis.  Diffuse T1 hypointense abnormality in the L1 vertebral body with involvement of bilateral pedicles and transverse processes.  CT chest abdomen and pelvis with contrast showed 1 cm left supraclavicular adenopathy.  1 cm right subcarinal lymph node.  Necrotic 3.1 x 2.4 x 2.9 cm anterior mediastinal mass in the left prevascular region with mild mass effect on the brachiocephalic vein.  Possible phrenic nerve involvement.  Mild acute sigmoid colon diverticulitis.    Patient had biopsy of the left supraclavicular lymph node which was consistent with adenocarcinoma of lung primary.  NGS testing did not show any actionable mutations.  K-ras G12 D mutation detected.  MSI stable.  Patient underwent percutaneous fixation of the L1 vertebral body.  She completed palliative radiation treatment to her spine  Interval history- She states that she is doing fairly well today. She returned from a trip to [place]. She had fun but it did wear her out a bit. No cold symptoms or fevers. She reports some continuation of hoarseness which she has  discussed in the past and was referred for speech therapy. Pain is well controlled on current regimen. No bleeding or significant bruising episodes.   ECOG PS- 1 Pain scale- 2 Opioid associated constipation- no  Review of systems- Review of Systems  Constitutional:  Positive for malaise/fatigue. Negative for chills, fever and weight loss.  HENT:  Negative for congestion, ear discharge and nosebleeds.   Eyes:  Negative for blurred vision.  Respiratory:  Negative for cough, hemoptysis, sputum production, shortness of breath and wheezing.   Cardiovascular:  Negative for chest pain, palpitations, orthopnea and claudication.  Gastrointestinal:  Negative for abdominal pain, blood in stool, constipation, diarrhea, heartburn, melena, nausea and vomiting.  Genitourinary:  Negative for dysuria, flank pain, frequency, hematuria and urgency.  Musculoskeletal:  Negative for back pain, joint pain and myalgias.  Skin:  Negative for rash.  Neurological:  Negative for dizziness, tingling, focal weakness, seizures, weakness and headaches.  Endo/Heme/Allergies:  Does not bruise/bleed easily.  Psychiatric/Behavioral:  Negative for depression and suicidal ideas. The patient does not have insomnia.       No Known Allergies   Past Medical History:  Diagnosis Date   Arthritis    hands and feet   Cancer associated pain    Cataract    right   Complication of anesthesia    sometimes have a hard time waking up   Diabetes mellitus    Borderline Diabetes   GERD (gastroesophageal reflux disease)    HTN (hypertension)    Hyperlipidemia    Motion sickness    Post corneal transplant    right     Past Surgical  History:  Procedure Laterality Date   APPLICATION OF INTRAOPERATIVE CT SCAN  02/15/2022   Procedure: APPLICATION OF INTRAOPERATIVE CT SCAN;  Surgeon: Meade Maw, MD;  Location: [facility] ORS;  Service: Neurosurgery;;   BASAL CELL CARCINOMA EXCISION     Dr. Manley Mason   CARPAL TUNNEL RELEASE      right   COLONOSCOPY WITH PROPOFOL N/A 12/03/2015   Procedure: COLONOSCOPY WITH PROPOFOL;  Surgeon: Manya Silvas, MD;  Location: [facility] ENDOSCOPY;  Service: Endoscopy;  Laterality: N/A;   COLONOSCOPY WITH PROPOFOL N/A 02/22/2020   Procedure: COLONOSCOPY WITH PROPOFOL;  Surgeon: Lesly Rubenstein, MD;  Location: [facility] ENDOSCOPY;  Service: Endoscopy;  Laterality: N/A;   ESOPHAGOGASTRODUODENOSCOPY (EGD) WITH PROPOFOL N/A 02/22/2020   Procedure: ESOPHAGOGASTRODUODENOSCOPY (EGD) WITH PROPOFOL;  Surgeon: Lesly Rubenstein, MD;  Location: [facility] ENDOSCOPY;  Service: Endoscopy;  Laterality: N/A;   ESOPHAGOGASTRODUODENOSCOPY (EGD) WITH PROPOFOL N/A 06/07/2022   Procedure: ESOPHAGOGASTRODUODENOSCOPY (EGD) WITH PROPOFOL;  Surgeon: Lesly Rubenstein, MD;  Location: [facility] ENDOSCOPY;  Service: Endoscopy;  Laterality: N/A;   HAMMER TOE SURGERY Right 05/23/2019   Procedure: HAMMER TOE CORRECTION;  Surgeon: Samara Deist, DPM;  Location: [place];  Service: Podiatry;  Laterality: Right;  Diabetic   IR BONE TUMOR(S)RF ABLATION  04/30/2022   IR IMAGING GUIDED PORT INSERTION  04/14/2022   IR KYPHO LUMBAR INC FX REDUCE BONE BX UNI/BIL CANNULATION INC/IMAGING  04/30/2022   IR RADIOLOGIST EVAL & MGMT  03/24/2022   OSTECTOMY Right 05/23/2019   Procedure: DOUBLE OSTEOTOMY RIGHT;  Surgeon: Samara Deist, DPM;  Location: [place];  Service: Podiatry;  Laterality: Right;   SKIN CANCER EXCISION      Social History   Socioeconomic History   Marital status: Widowed    Spouse name: Not on file   Number of children: Not on file   Years of education: Not on file   Highest education level: Not on file  Occupational History    Employer: [employer]    Comment: Currently not employed  Tobacco Use   Smoking status: Former    Types: Cigarettes    Quit date: 01/13/2009    Years since quitting: 13.4   Smokeless tobacco: Never  Vaping Use   Vaping Use: Never used  Substance and Sexual Activity   Alcohol  use: No   Drug use: No   Sexual activity: Not Currently    Birth control/protection: Post-menopausal  Other Topics Concern   Not on file  Social History Narrative   Not on file   Social Determinants of Health   Financial Resource Strain: Not on file  Food Insecurity: No Food Insecurity (03/31/2022)   Hunger Vital Sign    Worried About Running Out of Food in the Last Year: Never true    Ran Out of Food in the Last Year: Never true  Transportation Needs: No Transportation Needs (03/31/2022)   PRAPARE - Hydrologist (Medical): No    Lack of Transportation (Non-Medical): No  Physical Activity: Not on file  Stress: Not on file  Social Connections: Not on file  Intimate Partner Violence: Not At Risk (02/10/2022)   Humiliation, Afraid, Rape, and Kick questionnaire    Fear of Current or Ex-Partner: No    Emotionally Abused: No    Physically Abused: No    Sexually Abused: No    Family History  Problem Relation Age of Onset   Heart disease Mother    Heart disease Father    Heart disease  Maternal Grandmother    Heart disease Maternal Grandfather    Breast cancer Sister 62     Current Outpatient Medications:    acetaminophen (TYLENOL) 500 MG tablet, Take 500 mg by mouth every 6 (six) hours as needed for moderate pain, mild pain, fever or headache., Disp: , Rfl:    apixaban (ELIQUIS) 5 MG TABS tablet, Take 1 tablet (5 mg total) by mouth 2 (two) times daily., Disp: 60 tablet, Rfl: 2   Ascorbic Acid (VITAMIN C) 1000 MG tablet, Take 1,000 mg by mouth daily.  , Disp: , Rfl:    carvedilol (COREG) 3.125 MG tablet, Take 3.125 mg by mouth 2 (two) times daily with a meal., Disp: , Rfl:    dexamethasone (DECADRON) 4 MG tablet, TAKE 1 TABLET 2 TIMES DAILY STARTING DAY BEFORE PEMETREXED. THEN TAKE 2 TABS DAILY FOR 3 DAYS STARTING DAY AFTER CARBOPLATIN. TAKE WITH FOOD., Disp: 30 tablet, Rfl: 1   Docusate Calcium (STOOL SOFTENER PO), Take 1 capsule by mouth daily as  needed (constipation)., Disp: , Rfl:    folic acid (FOLVITE) 1 MG tablet, Take 1 tablet (1 mg total) by mouth daily. Start 7 days before pemetrexed chemotherapy. Continue until 21 days after pemetrexed completed., Disp: 100 tablet, Rfl: 3   gabapentin (NEURONTIN) 300 MG capsule, Take 1 capsule (300 mg total) by mouth 3 (three) times daily., Disp: 90 capsule, Rfl: 2   lidocaine-prilocaine (EMLA) cream, Apply 1 Application topically as needed., Disp: 30 g, Rfl: 2   losartan (COZAAR) 100 MG tablet, Take 100 mg by mouth daily., Disp: , Rfl:    metFORMIN (GLUCOPHAGE) 500 MG tablet, Take by mouth 2 (two) times daily with a meal., Disp: , Rfl:    metoprolol tartrate (LOPRESSOR) 25 MG tablet, Take 1 tablet (25 mg total) by mouth 2 (two) times daily., Disp: 60 tablet, Rfl: 0   Multiple Vitamin (MULTIVITAMIN) capsule, Take 1 capsule by mouth daily.  , Disp: , Rfl:    omeprazole (PRILOSEC) 20 MG capsule, Take 1 capsule (20 mg total) by mouth daily., Disp: 90 capsule, Rfl: 3   ondansetron (ZOFRAN) 8 MG tablet, Take 1 tablet (8 mg total) by mouth every 8 (eight) hours as needed for nausea or vomiting. Start on the third day after carboplatin., Disp: 30 tablet, Rfl: 1   oxyCODONE ER (XTAMPZA ER) 18 MG C12A, Take 1 capsule by mouth every 12 (twelve) hours., Disp: 60 capsule, Rfl: 0   Oxycodone HCl 20 MG TABS, Take 0.5-1 tablets (10-20 mg total) by mouth every 4 (four) hours as needed., Disp: 60 tablet, Rfl: 0   polyethylene glycol (MIRALAX / GLYCOLAX) 17 g packet, Take 17 g by mouth daily., Disp: 14 each, Rfl: 0   prednisoLONE acetate (PRED MILD) 0.12 % ophthalmic suspension, Place 1 drop into both eyes daily., Disp: , Rfl:    prochlorperazine (COMPAZINE) 10 MG tablet, Take 1 tablet (10 mg total) by mouth every 6 (six) hours as needed for nausea or vomiting., Disp: 30 tablet, Rfl: 2   simvastatin (ZOCOR) 40 MG tablet, TAKE 1 TABLET EVERY DAY  AT  6:00 PM, Disp: 90 tablet, Rfl: 3   cyclobenzaprine (FLEXERIL) 5 MG  tablet, Take 1 tablet (5 mg total) by mouth 3 (three) times daily as needed for muscle spasms. (Patient not taking: Reported on 06/18/2022), Disp: 30 tablet, Rfl: 0   LORazepam (ATIVAN) 0.5 MG tablet, Take 1 tablet (0.5 mg total) by mouth every 8 (eight) hours as needed for anxiety. (Patient not taking:  Reported on 06/18/2022), Disp: 30 tablet, Rfl: 0  Physical exam:  Vitals:   06/18/22 0955  BP: 122/63  Pulse: 92  Resp: 18  Temp: 99.8 F (37.7 C)  TempSrc: Tympanic  SpO2: 98%  Weight: 116 lb 8 oz (52.8 kg)  Height: 5' (1.524 m)    Physical Exam Constitutional:      General: She is not in acute distress.    Appearance: Normal appearance. She is not ill-appearing, toxic-appearing or diaphoretic.     Comments: No significant hoarseness of voice   Cardiovascular:     Rate and Rhythm: Normal rate and regular rhythm.     Heart sounds: Normal heart sounds.  Pulmonary:     Effort: Pulmonary effort is normal.     Breath sounds: Normal breath sounds.  Abdominal:     Palpations: Abdomen is soft.  Skin:    General: Skin is warm and dry.  Neurological:     Mental Status: She is alert and oriented to person, place, and time.         Latest Ref Rng & Units 06/18/2022    9:07 AM  CMP  Glucose 70 - 99 mg/dL 134   BUN 8 - 23 mg/dL 19   Creatinine 0.44 - 1.00 mg/dL 0.77   Sodium 135 - 145 mmol/L 134   Potassium 3.5 - 5.1 mmol/L 4.0   Chloride 98 - 111 mmol/L 98   CO2 22 - 32 mmol/L 26   Calcium 8.9 - 10.3 mg/dL 9.5   Total Protein 6.5 - 8.1 g/dL 7.4   Total Bilirubin 0.3 - 1.2 mg/dL 0.2   Alkaline Phos 38 - 126 U/L 76   AST 15 - 41 U/L 29   ALT 0 - 44 U/L 21       Latest Ref Rng & Units 06/18/2022    9:07 AM  CBC  WBC 4.0 - 10.5 K/uL 4.6   Hemoglobin 12.0 - 15.0 g/dL 11.1   Hematocrit 36.0 - 46.0 % 34.5   Platelets 150 - 400 K/uL 223     No images are attached to the encounter.  DG Chest 2 View  Result Date: 05/22/2022 CLINICAL DATA:  Cough, shortness of breath, and fatigue  beginning yesterday. EXAM: CHEST - 2 VIEW COMPARISON:  03/31/2022 and 08/18/2019 FINDINGS: The heart size and mediastinal contours are within normal limits. Right-sided Port-A-Cath is seen in appropriate position. Lingular and left lower lobe scarring remains stable. No evidence of acute infiltrate or edema. No evidence of pleural effusion. Lower thoracic and lumbar spine fusion hardware again noted. IMPRESSION: Stable lingular and left lower lobe scarring. No acute findings. Electronically Signed   By: Marlaine Hind M.D.   On: 05/22/2022 10:33     Assessment and plan- Patient is a 72 y.o. female with metastatic lung cancer with bone metastases.  She is here for on treatment assessment prior to cycle 3 of carbo Alimta Keytruda chemotherapy  Counts okay to proceed with cycle 4 of carbo Alimta Keytruda chemotherapy today.  She has follow up CT imaging scheduled for 3/22/3/21 and has follow up with Dr. Janese Banks on 3/29. Plan is that if scans overall shows stable disease/partial response she will continue with Alimta and Keytruda chemotherapy every 3 weeks without carboplatin until progression or toxicity.   Iron deficiency anemia: Dose 5 of Venofer today  Segmental pulmonary embolism continue Eliquis. NO bleeding or bruising episodes.   Neoplasm related pain: Continue as needed oxycodone and Xtampza.  Bone metastases: Zometa today.  Has had dental clearance.   She will call to scheduled her speech therapy- number in instructions.   Disposition:  Cycle 4 Carbo/Alimta/Keytruda today with Venofer and Zometa Imaging scheduled for 3/21 and 3/22 with follow up visit to discuss results on 3/29   Visit Diagnosis 1. Other iron deficiency anemia   2. Primary malignant neoplasm of right lung metastatic to other site ([place])   3. Encounter for antineoplastic chemotherapy   4. Encounter for antineoplastic immunotherapy   5. Neoplasm related pain   6. Dysphagia, unspecified type      Minna Antis [facility]  at [facility] [id number] 06/18/2022 10:15 AM

## 2022-06-18 NOTE — Patient Instructions (Signed)
[facility] [place] [facility]  Discharge Instructions: Thank you for choosing [facility] to provide your oncology and hematology care.  If you have a lab appointment with the Cancer Center, please go directly to the Cancer Center and check in at the registration area.  Wear comfortable clothing and clothing appropriate for easy access to any Portacath or PICC line.   We strive to give you quality time with your provider. You may need to reschedule your appointment if you arrive late (15 or more minutes).  Arriving late affects you and other patients whose appointments are after yours.  Also, if you miss three or more appointments without notifying the office, you may be dismissed from the clinic at the provider's discretion.      For prescription refill requests, have your pharmacy contact our office and allow 72 hours for refills to be completed.     To help prevent nausea and vomiting after your treatment, we encourage you to take your nausea medication as directed.  BELOW ARE SYMPTOMS THAT SHOULD BE REPORTED IMMEDIATELY: *FEVER GREATER THAN 100.4 F (38 C) OR HIGHER *CHILLS OR SWEATING *NAUSEA AND VOMITING THAT IS NOT CONTROLLED WITH YOUR NAUSEA MEDICATION *UNUSUAL SHORTNESS OF BREATH *UNUSUAL BRUISING OR BLEEDING *URINARY PROBLEMS (pain or burning when urinating, or frequent urination) *BOWEL PROBLEMS (unusual diarrhea, constipation, pain near the anus) TENDERNESS IN MOUTH AND THROAT WITH OR WITHOUT PRESENCE OF ULCERS (sore throat, sores in mouth, or a toothache) UNUSUAL RASH, SWELLING OR PAIN  UNUSUAL VAGINAL DISCHARGE OR ITCHING   Items with * indicate a potential emergency and should be followed up as soon as possible or go to the Emergency Department if any problems should occur.  Please show the CHEMOTHERAPY ALERT CARD or IMMUNOTHERAPY ALERT CARD at check-in to the Emergency Department and triage nurse.  Should you have questions after your visit  or need to cancel or reschedule your appointment, please contact [facility] [place] [facility]  [phone] and follow the prompts.  Office hours are 8:00 a.m. to 4:30 p.m. Monday - Friday. Please note that voicemails left after 4:00 p.m. may not be returned until the following business day.  We are closed weekends and major holidays. You have access to a nurse at all times for urgent questions. Please call the main number to the clinic [phone] and follow the prompts.  For any non-urgent questions, you may also contact your provider using MyChart. We now offer e-Visits for anyone 18 and older to request care online for non-urgent symptoms. For details visit [email or web address].   Also download the MyChart app! Go to the app store, search "MyChart", open the app, select [facility], and log in with your MyChart username and password.    

## 2022-06-23 ENCOUNTER — Ambulatory Visit
Admission: RE | Admit: 2022-06-23 | Discharge: 2022-06-23 | Disposition: A | Discharge status: Home / Self Care | Payer: Medicare HMO | Source: Ambulatory Visit | Attending: Interventional Radiology | Admitting: Interventional Radiology

## 2022-06-23 HISTORY — PX: IR RADIOLOGIST EVAL & MGMT: IMG5224

## 2022-06-23 NOTE — Progress Notes (Signed)
Reason for visit; Hx of L1 pathologic compression fx with poorly controlled pain. Follow up today s/p L1 RFA ablation and BKP   Care Team(s); Primary Care: Lynnell Jude, MD Medical Oncology: Sindy Guadeloupe, MD Hospice and Palliative Medicine: [place] Oncology: Noreene Filbert, MD   Neurosurgery:  Meade Maw, MD     Virtual Visit via Telephone Note   I connected with on Mrs. [name] on 06/23/22 by telephone and verified that I am speaking with the correct person using two identifiers. I discussed the limitations, risks, security and privacy concerns of performing an evaluation and management service by telephone and the availability of in-person appointments.   History of Present Illness:  Amber Glenn is a 36 F comorbid with PMHx significant for smoking and recently diagnosed metastatic lung CA. Briefly, she had presented to ER on 02/10/22 s/p mechanical fall with workup revealing L1 pathologic fracture. Secondary to 3 column involvement and concern for instability, Neurosurgery proceeded with ORIF and T11-L3 PSLF on 02/15/22 Izora Ribas). Supraclavicular LN Bx on 02/11/22 Kathlene Cote) was revealing for metastatic adenoCA. Radiation Oncology recommended palliative XRT.  The patient continued to experience significant discomfort, despite stabilization, and Palliative Medicine referred her for RFA ablation to help control her back pain. She had been on PO analgesia, though that been challenging due to long standing dysphagia with swallowing pills. She underwent uneventful CT-assisted and fluoroscopic-guided L1 vertebral body biopsy, radiofrequency "OsteoCool" ablation and bi-pedicular cement augmentation with balloon kyphoplasty with me on 04/30/22 and was discharged the same day. On immediate televisit follow up on POD 1 the Pt reported feeling "great" and with minimal discomfort. On today's follow up she denies any significant back pain. Her pain is overall  improved with PRN Oxycodone for breakthrough pain. She endorses developing discomfort in her hips which she noticed after a fall. She reports normal bowel and bladder function. ROS was positive for hoarseness of voice and persistent dysphagia. She has seen GI, Dr Haig Prophet, for this and underwent EGD on 06/07/22. She is scheduled for a swallow study on 07/05/22 to further elucidate this.  Review of Systems: A 12-point ROS discussed, and pertinent positives are indicated in the HPI above   Past Medical History:  Diagnosis Date   Arthritis    hands and feet   Cancer associated pain    Cataract    right   Complication of anesthesia    sometimes have a hard time waking up   Diabetes mellitus    Borderline Diabetes   GERD (gastroesophageal reflux disease)    HTN (hypertension)    Hyperlipidemia    Motion sickness    Post corneal transplant    right    Past Surgical History:  Procedure Laterality Date   APPLICATION OF INTRAOPERATIVE CT SCAN  02/15/2022   Procedure: APPLICATION OF INTRAOPERATIVE CT SCAN;  Surgeon: Meade Maw, MD;  Location: [facility] ORS;  Service: Neurosurgery;;   BASAL CELL CARCINOMA EXCISION     Dr. Manley Mason   CARPAL TUNNEL RELEASE     right   COLONOSCOPY WITH PROPOFOL N/A 12/03/2015   Procedure: COLONOSCOPY WITH PROPOFOL;  Surgeon: Manya Silvas, MD;  Location: [facility];  Service: Endoscopy;  Laterality: N/A;   COLONOSCOPY WITH PROPOFOL N/A 02/22/2020   Procedure: COLONOSCOPY WITH PROPOFOL;  Surgeon: Lesly Rubenstein, MD;  Location: [facility] ENDOSCOPY;  Service: Endoscopy;  Laterality: N/A;   ESOPHAGOGASTRODUODENOSCOPY (EGD) WITH PROPOFOL N/A 02/22/2020   Procedure: ESOPHAGOGASTRODUODENOSCOPY (EGD) WITH PROPOFOL;  Surgeon: Haig Prophet,  Hilton Cork, MD;  Location: [facility] ENDOSCOPY;  Service: Endoscopy;  Laterality: N/A;   ESOPHAGOGASTRODUODENOSCOPY (EGD) WITH PROPOFOL N/A 06/07/2022   Procedure: ESOPHAGOGASTRODUODENOSCOPY (EGD) WITH PROPOFOL;  Surgeon: Lesly Rubenstein, MD;  Location: [facility] ENDOSCOPY;  Service: Endoscopy;  Laterality: N/A;   HAMMER TOE SURGERY Right 05/23/2019   Procedure: HAMMER TOE CORRECTION;  Surgeon: Samara Deist, DPM;  Location: [place];  Service: Podiatry;  Laterality: Right;  Diabetic   IR BONE TUMOR(S)RF ABLATION  04/30/2022   IR IMAGING GUIDED PORT INSERTION  04/14/2022   IR KYPHO LUMBAR INC FX REDUCE BONE BX UNI/BIL CANNULATION INC/IMAGING  04/30/2022   IR RADIOLOGIST EVAL & MGMT  03/24/2022   IR RADIOLOGIST EVAL & MGMT  06/23/2022   OSTECTOMY Right 05/23/2019   Procedure: DOUBLE OSTEOTOMY RIGHT;  Surgeon: Samara Deist, DPM;  Location: [place];  Service: Podiatry;  Laterality: Right;   SKIN CANCER EXCISION      Allergies: Patient has no known allergies.  Medications: Prior to Admission medications   Medication Sig Start Date End Date Taking? Authorizing Provider  acetaminophen (TYLENOL) 500 MG tablet Take 500 mg by mouth every 6 (six) hours as needed for moderate pain, mild pain, fever or headache.    [provider]  apixaban (ELIQUIS) 5 MG TABS tablet Take 1 tablet (5 mg total) by mouth 2 (two) times daily. 05/10/22   Sindy Guadeloupe, MD  Ascorbic Acid (VITAMIN C) 1000 MG tablet Take 1,000 mg by mouth daily.      [provider]  carvedilol (COREG) 3.125 MG tablet Take 3.125 mg by mouth 2 (two) times daily with a meal.    [provider]  cyclobenzaprine (FLEXERIL) 5 MG tablet Take 1 tablet (5 mg total) by mouth 3 (three) times daily as needed for muscle spasms. Patient not taking: Reported on 06/18/2022 05/11/22   Borders, Kirt Boys, NP  dexamethasone (DECADRON) 4 MG tablet TAKE 1 TABLET 2 TIMES DAILY STARTING DAY BEFORE PEMETREXED. THEN TAKE 2 TABS DAILY FOR 3 DAYS STARTING DAY AFTER CARBOPLATIN. TAKE WITH FOOD. 06/07/22   Sindy Guadeloupe, MD  Docusate Calcium (STOOL SOFTENER PO) Take 1 capsule by mouth daily as needed (constipation).    [provider]  folic acid  (FOLVITE) 1 MG tablet Take 1 tablet (1 mg total) by mouth daily. Start 7 days before pemetrexed chemotherapy. Continue until 21 days after pemetrexed completed. 04/06/22   Sindy Guadeloupe, MD  gabapentin (NEURONTIN) 300 MG capsule Take 1 capsule (300 mg total) by mouth 3 (three) times daily. 03/23/22   Borders, Kirt Boys, NP  lidocaine-prilocaine (EMLA) cream Apply 1 Application topically as needed. 04/14/22   Sindy Guadeloupe, MD  LORazepam (ATIVAN) 0.5 MG tablet Take 1 tablet (0.5 mg total) by mouth every 8 (eight) hours as needed for anxiety. Patient not taking: Reported on 06/18/2022 03/30/22   Borders, Kirt Boys, NP  losartan (COZAAR) 100 MG tablet Take 100 mg by mouth daily.    [provider]  metFORMIN (GLUCOPHAGE) 500 MG tablet Take by mouth 2 (two) times daily with a meal.    [provider]  metoprolol tartrate (LOPRESSOR) 25 MG tablet Take 1 tablet (25 mg total) by mouth 2 (two) times daily. 04/01/22   Loletha Grayer, MD  Multiple Vitamin (MULTIVITAMIN) capsule Take 1 capsule by mouth daily.      [provider]  omeprazole (PRILOSEC) 20 MG capsule Take 1 capsule (20 mg total) by mouth daily. 12/20/14  Jackolyn Confer, MD  ondansetron (ZOFRAN) 8 MG tablet Take 1 tablet (8 mg total) by mouth every 8 (eight) hours as needed for nausea or vomiting. Start on the third day after carboplatin. 04/06/22   Sindy Guadeloupe, MD  oxyCODONE ER [facility]) 18 MG C12A Take 1 capsule by mouth every 12 (twelve) hours. 05/21/22   Borders, Kirt Boys, NP  Oxycodone HCl 20 MG TABS Take 0.5-1 tablets (10-20 mg total) by mouth every 4 (four) hours as needed. 06/16/22   Borders, Kirt Boys, NP  polyethylene glycol (MIRALAX / GLYCOLAX) 17 g packet Take 17 g by mouth daily. 02/18/22   Richarda Osmond, MD  prednisoLONE acetate (PRED MILD) 0.12 % ophthalmic suspension Place 1 drop into both eyes daily.    [provider]  prochlorperazine (COMPAZINE) 10 MG tablet Take 1 tablet (10 mg total)  by mouth every 6 (six) hours as needed for nausea or vomiting. 03/11/22   Borders, Kirt Boys, NP  simvastatin (ZOCOR) 40 MG tablet TAKE 1 TABLET EVERY DAY  AT  6:00 PM 12/20/14   Jackolyn Confer, MD     Family History  Problem Relation Age of Onset   Heart disease Mother    Heart disease Father    Heart disease Maternal Grandmother    Heart disease Maternal Grandfather    Breast cancer Sister 8    Social History   Socioeconomic History   Marital status: Widowed    Spouse name: Not on file   Number of children: Not on file   Years of education: Not on file   Highest education level: Not on file  Occupational History    Employer: [employer]    Comment: Currently not employed  Tobacco Use   Smoking status: Former    Types: Cigarettes    Quit date: 01/13/2009    Years since quitting: 13.4   Smokeless tobacco: Never  Vaping Use   Vaping Use: Never used  Substance and Sexual Activity   Alcohol use: No   Drug use: No   Sexual activity: Not Currently    Birth control/protection: Post-menopausal  Other Topics Concern   Not on file  Social History Narrative   Not on file   Social Determinants of Health   Financial Resource Strain: Not on file  Food Insecurity: No Food Insecurity (03/31/2022)   Hunger Vital Sign    Worried About Running Out of Food in the Last Year: Never true    Ran Out of Food in the Last Year: Never true  Transportation Needs: No Transportation Needs (03/31/2022)   PRAPARE - Hydrologist (Medical): No    Lack of Transportation (Non-Medical): No  Physical Activity: Not on file  Stress: Not on file  Social Connections: Not on file    Vital Signs: There were no vitals taken for this visit.  Physical Exam  Deferred secondary to virtual visit.  Imaging:  L1 RFA + BKP, 04/30/22 Adequate cement filling of the L1 vertebral body. No radiographic evidence of complication.     Labs:  CBC: Recent Labs    04/28/22 1018  05/07/22 0903 05/28/22 0914 06/18/22 0907  WBC 1.2* 4.0 4.8 4.6  HGB 9.1* 9.8* 10.0* 11.1*  HCT 28.8* 30.9* 31.5* 34.5*  PLT 106* 401* 331 223    COAGS: Recent Labs    04/30/22 1239  INR 1.1    BMP: Recent Labs    04/28/22 1018 05/07/22 0903 05/28/22 0914 06/18/22  0907  NA 130* 130* 131* 134*  K 3.7 4.1 3.9 4.0  CL 96* 95* 97* 98  CO2 '26 26 24 26  '$ GLUCOSE 154* 204* 214* 134*  BUN 11 25* 26* 19  CALCIUM 9.0 9.4 9.2 9.5  CREATININE 0.53 0.63 0.58 0.77  GFRNONAA >60 >60 >60 >60    Pathology  SURGICAL PATHOLOGY CASE: [id number] PATIENT: Effie Berkshire Surgical Pathology Report  Specimen Submitted: A. L1 bone  Clinical History: MET lung CA.  DIAGNOSIS: A. BONE, L1 VERTEBRAL BODY; CT-GUIDED BIOPSY: - POSITIVE FOR MALIGNANCY. - METASTATIC CARCINOMA, COMPATIBLE WITH PATIENT HISTORY OF PULMONARY ADENOCARCINOMA.   Assessment and Plan:  72 y/o F w metastatic lung CA and symptomatic L1 pathologic fracture s/p T11-L3 PSLF by NSGY, then L1 Bone Biopsy, Percutaneous RFA "Osteocool" and Balloon Kyphoplasty Vertebral Augmentation on 04/30/22.   Pain significantly improved. Breakthrough pain well controlled with PRN PO analgesia (Oxycodone)  *No post procedural concern.  *Persistent dysphagia and hoarseness on ROS. GI actively involved in workup. *Further management by Med Onc / Rad Onc. *will peripherally follow routine cancer care imaging. *Return to [facility] PRN.  Thank you for allowing me to participate in the care of this Patient.  Electronically Signed:  Michaelle Birks, MD [facility] Radiology   Pager. [phone] Clinic. [phone]   I spent a total of 25 Minutes of non-face-to-face time in clinical consultation, greater than 50% of which was counseling/coordinating care for Mrs DANICE STUVE evaluation for L1 pathologic fracture pain management with ablation and augmentation.

## 2022-06-24 ENCOUNTER — Inpatient Hospital Stay (HOSPITAL_BASED_OUTPATIENT_CLINIC_OR_DEPARTMENT_OTHER): Payer: Medicare HMO | Admitting: Hospice and Palliative Medicine

## 2022-06-24 DIAGNOSIS — C3491 Malignant neoplasm of unspecified part of right bronchus or lung: Secondary | ICD-10-CM | POA: Diagnosis not present

## 2022-06-24 DIAGNOSIS — G893 Neoplasm related pain (acute) (chronic): Secondary | ICD-10-CM

## 2022-06-24 DIAGNOSIS — R110 Nausea: Secondary | ICD-10-CM

## 2022-06-24 DIAGNOSIS — E119 Type 2 diabetes mellitus without complications: Secondary | ICD-10-CM | POA: Diagnosis not present

## 2022-06-24 DIAGNOSIS — I10 Essential (primary) hypertension: Secondary | ICD-10-CM

## 2022-06-24 MED ORDER — ONDANSETRON HCL 8 MG PO TABS: 8.0000 mg | ORAL_TABLET | Freq: Three times a day (TID) | ORAL | 1 refills | Status: DC | PRN

## 2022-06-24 MED ORDER — PROCHLORPERAZINE MALEATE 10 MG PO TABS: 10.0000 mg | ORAL_TABLET | Freq: Four times a day (QID) | ORAL | 2 refills | Status: DC | PRN

## 2022-06-24 NOTE — Progress Notes (Signed)
Virtual Visit via Telephone Note  I connected with [name] on 06/24/22 at  3:00 PM EDT by telephone and verified that I am speaking with the correct person using two identifiers.  Location: Patient: Home Provider: Clinic   I discussed the limitations, risks, security and privacy concerns of performing an evaluation and management service by telephone and the availability of in person appointments. I also discussed with the patient that there may be a patient responsible charge related to this service. The patient expressed understanding and agreed to proceed.   History of Present Illness: Amber Glenn is a 72 y.o. female with multiple medical problems including diabetes, hypertension, hyperlipidemia, depression Patient was hospitalized 02/10/2022 to 02/18/2022 with back pain.  MRI of the lumbar spine revealed an acute pathologic compression fracture of L1 and patient ultimately required percutaneous fixation by neurosurgery.  CT of the chest, abdomen, and pelvis revealed a centrally necrotic anterior mediastinal mass with phrenic nerve impingement and diaphragmatic dysfunction, scattered bilateral lung opacities, hypoenhancement in the inferior pancreatic head/body.  Patient underwent lymph node biopsy positive for adenocarcinoma of the lung.  Patient's hospitalization was complicated by pain and delirium.  Palliative care was consulted to help address goals and manage ongoing symptoms.    Observations/Objective: I spoke with patient by phone.   Patient reports she is doing reasonably well.  She denies significant changes or concerns.  Pain remains fairly tolerable following RFA with continued use of Xtampza/oxycodone IR for breakthrough pain.  Patient denies any adverse effects or issues with pain medications.  Both were recently refilled last week.  Patient denies constipation as long as she takes her MiraLAX.  She does have occasional nausea associated with chemotherapy and request  refill of her antiemetics.  Assessment and Plan: Neoplasm related pain continue gabapentin.  Continue Xtampza and oxycodone. Both were recently refilled on 3/6  Nausea - refill antiemetics   Stage IV adenocarcinoma lung -patient has follow-up scheduled with Dr. Janese Banks in two weeks  Follow Up Instructions: Follow-up telephone visit 1-2 months   I discussed the assessment and treatment plan with the patient. The patient was provided an opportunity to ask questions and all were answered. The patient agreed with the plan and demonstrated an understanding of the instructions.   The patient was advised to call back or seek an in-person evaluation if the symptoms worsen or if the condition fails to improve as anticipated.  I provided 10 minutes of non-face-to-face time during this encounter.   [name], NP

## 2022-06-30 ENCOUNTER — Other Ambulatory Visit: Payer: Self-pay | Admitting: Oncology

## 2022-06-30 ENCOUNTER — Telehealth: Payer: Self-pay | Admitting: *Deleted

## 2022-06-30 DIAGNOSIS — R112 Nausea with vomiting, unspecified: Secondary | ICD-10-CM

## 2022-06-30 DIAGNOSIS — C3491 Malignant neoplasm of unspecified part of right bronchus or lung: Secondary | ICD-10-CM

## 2022-06-30 MED ORDER — OLANZAPINE 10 MG PO TABS: 10.0000 mg | ORAL_TABLET | Freq: Every day | ORAL | 2 refills | Status: DC

## 2022-06-30 MED ORDER — FOLIC ACID 1 MG PO TABS: 1.0000 mg | ORAL_TABLET | Freq: Every day | ORAL | 3 refills | Status: DC

## 2022-06-30 NOTE — Telephone Encounter (Signed)
Olanzapine 10 mg qhs

## 2022-06-30 NOTE — Telephone Encounter (Signed)
Pt's daughter is asking if pt can try another medication to help with nausea/vomiting. Pt is currently taking zofran and compazine that does not help relieve her nausea. States pt had a few episodes of nausea/vomiting yesterday and continues to have some nausea today with very little vomiting. Pt has been drinking fluids today per daughter.   Please advise.

## 2022-07-01 ENCOUNTER — Telehealth: Payer: Self-pay | Admitting: *Deleted

## 2022-07-01 ENCOUNTER — Encounter
Admission: RE | Admit: 2022-07-01 | Discharge: 2022-07-01 | Disposition: A | Discharge status: Home / Self Care | Payer: Medicare HMO | Source: Ambulatory Visit | Attending: Oncology | Admitting: Oncology

## 2022-07-01 DIAGNOSIS — C3491 Malignant neoplasm of unspecified part of right bronchus or lung: Secondary | ICD-10-CM | POA: Diagnosis present

## 2022-07-01 MED ORDER — TECHNETIUM TC 99M MEDRONATE IV KIT
23.1400 | PACK | Freq: Once | INTRAVENOUS | Status: AC | PRN
  Administered 2022-07-01: 23.14 via INTRAVENOUS

## 2022-07-01 NOTE — Telephone Encounter (Signed)
I got a paper to call insurance. I called the pharmacy and the person I spoke with said that he thinks it can cause side effects. They asked it I could call insurance to check. I called [employer] and they said it needs PA. I gave the info to the person on phone with [facility] and let her know the cancer and the nausea from chemo. She has had this drug in Nov 2023. And Dr. Janese Banks wanted her to be on 10 mg since she keeps having on and off nausea around chemo. They will give [place] 24 to 72 hours to let us know

## 2022-07-01 NOTE — Telephone Encounter (Signed)
Pt's daughter called stating that the pharmacy informed her that the prescription for zyprexa requires some form of authorization before it can be filled. Per Judeen Hammans, she spoke to pharmacy earlier today who informed her that she will need to call pt's insurance company to get it authorized. [name] made aware that Judeen Hammans is working on getting the medication approved through insurance. [name] asked for call back with any updates after calling insurance - [phone].

## 2022-07-02 ENCOUNTER — Ambulatory Visit
Admission: RE | Admit: 2022-07-02 | Discharge: 2022-07-02 | Disposition: A | Discharge status: Home / Self Care | Payer: Medicare HMO | Source: Ambulatory Visit | Attending: Oncology | Admitting: Oncology

## 2022-07-02 ENCOUNTER — Other Ambulatory Visit: Payer: Self-pay | Admitting: *Deleted

## 2022-07-02 ENCOUNTER — Telehealth: Payer: Self-pay | Admitting: *Deleted

## 2022-07-02 DIAGNOSIS — C3491 Malignant neoplasm of unspecified part of right bronchus or lung: Secondary | ICD-10-CM | POA: Diagnosis present

## 2022-07-02 MED ORDER — IOHEXOL 300 MG/ML  SOLN
100.0000 mL | Freq: Once | INTRAMUSCULAR | Status: AC | PRN
  Administered 2022-07-02: 100 mL via INTRAVENOUS

## 2022-07-02 MED ORDER — HEPARIN SOD (PORK) LOCK FLUSH 100 UNIT/ML IV SOLN
INTRAVENOUS | Status: AC
  Filled 2022-07-02: qty 5

## 2022-07-02 MED ORDER — AMOXICILLIN-POT CLAVULANATE 875-125 MG PO TABS: 1.0000 | ORAL_TABLET | Freq: Two times a day (BID) | ORAL | 0 refills | Status: DC

## 2022-07-02 MED ORDER — HEPARIN SOD (PORK) LOCK FLUSH 100 UNIT/ML IV SOLN
500.0000 [IU] | Freq: Once | INTRAVENOUS | Status: AC
  Administered 2022-07-02: 500 [IU] via INTRAVENOUS
  Filled 2022-07-02: qty 5

## 2022-07-02 NOTE — Telephone Encounter (Addendum)
Daughter called stating that she feels patient may have pneumonia again, She has not been feeling well for a few days, has a deep nonproductive cough Is not running a fever currently. She is at [facility] now having her CT scan, Do you want to order a CXR stat while she is there?

## 2022-07-02 NOTE — Telephone Encounter (Signed)
I know that pt. Was needing her zyprexa and I had to call [facility] and fill out form and today I got a letter that it was approved and you can pick it up at pharmacy

## 2022-07-02 NOTE — Telephone Encounter (Signed)
Per Dr Janese Banks, she will send in Augmenting for patient while waiting on CT results since patient did not want to come in to see [facility] today. [name] informed and thanked me for letting her know

## 2022-07-05 ENCOUNTER — Ambulatory Visit
Admission: RE | Admit: 2022-07-05 | Discharge: 2022-07-05 | Disposition: A | Discharge status: Home / Self Care | Payer: Medicare HMO | Source: Ambulatory Visit | Attending: Gastroenterology | Admitting: Gastroenterology

## 2022-07-05 DIAGNOSIS — R1312 Dysphagia, oropharyngeal phase: Secondary | ICD-10-CM | POA: Diagnosis not present

## 2022-07-05 NOTE — Progress Notes (Signed)
Modified Barium Swallow Study  Patient Details  Name: Amber Glenn MRN: [id number] Date of Birth: 1951-03-23  Today's Date: 07/05/2022  Modified Barium Swallow completed.  Full report located under Chart Review in the Imaging Section.  History of Present Illness Patient is a 72 y.o. female with past medical history including diabetes, HTN, HLD, who was hospitalized 02/10/22-02/18/22 with back pain and found to have acute compression fracture of L1, s/p percutaneous fixation. CT chest, abdomen and pelvis revealed centrally necrotic anterior mediastinal mass with phrenic nerve impingement and diaphragmatic dysfunction. Per GI notes, pt noted dysphagia and hoarseness s/p mediastinal biopsy. She was also found to have adenocarcinoma of the lung. Barium Swallow on 03/24/22: "Mild relative narrowing of the distal esophagus just proximal to  the gastroesophageal junction concerning for a mild stricture  restricting the passage of a 13 mm barium tablet.  Mild tertiary contractions of the esophagus as can be seen with mild spasm." EGD on 06/07/22: "No endoscopic abnormality was evident in the esophagus to explain the patient's complaint of dysphagia." Patient reports she is currently taking antibiotics for pneumonia, and that she gets pneumonia frequently. She was noted to have a cough and wet vocal quality after sipping a cup of water prior to the exam.   Clinical Impression Patient presents with multifactorial oropharyngeal and pharyngoesophageal dysphagia. Motor and sensory impairments are noted, with resulting silent aspiration of thin liquids. Oral stage is characterized by slow, disorganized mastication and lingual transport of solid. Swallow initiation occurs at the posterior laryngeal surface of the epiglottis with liquids. Anterior hyoid excursion, laryngeal closure, and epiglottic deflection are incomplete, which results in frank penetration and aspiration of thin liquid during the swallow which was  unsensed. Cued cough was not effective to fully eject aspiration, and was subjectively weak. Strategies including 3 second bolus hold, as well as left and right head turn did not prevent penetration/aspiration. Pharyngeal stripping wave was diminished, and in anterior-posterior view unilateral bulging occurred (left), suggesting unilateral weakness. Appearance of slow clearance through the cervical esophagus, as well as retrograde flow of contrast with most consistencies below the level of the pharyngoesophageal segment. Given hoarse vocal quality, recommend ENT consult for further assessment to determine whether vocal cord weakness or paralysis may be contributing to new onset dysphagia. Recommend pt thicken liquids to nectar consistency, and consume single sips using slow rate. She is at increased risk for an adverse event given frequent (and current) pneumonia, likely consistent aspiration of large volumes, as well as other comorbidities. Recommendations provided in handout form and via teachback to pt, also discussed with her daughter by phone. Should ENT identify focal deficits, pt may benefit from course of OP ST for swallowing and vocal function, including potentially respiratory muscle strength training. Factors that may increase risk of adverse event in presence of aspiration ([place] 2021): Respiratory or GI disease;Poor general health and/or compromised immunity;Frequent aspiration of large volumes  Swallow Evaluation Recommendations Recommendations: PO diet PO Diet Recommendation: Regular;Dysphagia 3 (Mechanical soft);Mildly thick liquids (Level 2, nectar thick) Liquid Administration via: Cup Medication Administration: Whole meds with puree Supervision: Patient able to self-feed;Intermittent supervision/cueing for swallowing strategies Swallowing strategies  : Slow rate;Small bites/sips Postural changes: Position pt fully upright for meals;Stay upright 30-60 min after meals Oral care  recommendations: Oral care QID (4x/day) Recommended consults: Consider ENT consultation Caregiver Recommendations:  (avoid mixed consistencies)   Deneise Lever, [place], CCC-SLP Speech-Language Pathologist [phone]    Aliene Altes 07/05/2022,4:01 PM

## 2022-07-08 ENCOUNTER — Other Ambulatory Visit: Payer: Self-pay | Admitting: *Deleted

## 2022-07-08 MED ORDER — OXYCODONE HCL 20 MG PO TABS: 10.0000 mg | ORAL_TABLET | ORAL | 0 refills | Status: DC | PRN

## 2022-07-09 ENCOUNTER — Encounter: Payer: Self-pay | Admitting: Oncology

## 2022-07-09 ENCOUNTER — Inpatient Hospital Stay: Payer: Medicare HMO

## 2022-07-09 ENCOUNTER — Inpatient Hospital Stay (HOSPITAL_BASED_OUTPATIENT_CLINIC_OR_DEPARTMENT_OTHER): Payer: Medicare HMO | Admitting: Oncology

## 2022-07-09 VITALS — BP 106/51 | HR 95 | Temp 96.8°F | Resp 18 | Ht 60.0 in | Wt 116.5 lb

## 2022-07-09 DIAGNOSIS — C3491 Malignant neoplasm of unspecified part of right bronchus or lung: Secondary | ICD-10-CM

## 2022-07-09 DIAGNOSIS — D649 Anemia, unspecified: Secondary | ICD-10-CM

## 2022-07-09 DIAGNOSIS — Z5111 Encounter for antineoplastic chemotherapy: Secondary | ICD-10-CM | POA: Diagnosis not present

## 2022-07-09 DIAGNOSIS — Z5112 Encounter for antineoplastic immunotherapy: Secondary | ICD-10-CM

## 2022-07-09 LAB — COMPREHENSIVE METABOLIC PANEL
ALT: 18 U/L (ref 0–44)
AST: 25 U/L (ref 15–41)
Albumin: 3.3 g/dL — ABNORMAL LOW (ref 3.5–5.0)
Alkaline Phosphatase: 86 U/L (ref 38–126)
Anion gap: 7 (ref 5–15)
BUN: 24 mg/dL — ABNORMAL HIGH (ref 8–23)
CO2: 25 mmol/L (ref 22–32)
Calcium: 8.4 mg/dL — ABNORMAL LOW (ref 8.9–10.3)
Chloride: 100 mmol/L (ref 98–111)
Creatinine, Ser: 0.97 mg/dL (ref 0.44–1.00)
GFR, Estimated: >60 mL/min (ref >60)
Glucose, Bld: 230 mg/dL — ABNORMAL HIGH (ref 70–99)
Potassium: 4 mmol/L (ref 3.5–5.1)
Sodium: 132 mmol/L — ABNORMAL LOW (ref 135–145)
Total Bilirubin: 0.4 mg/dL (ref 0.3–1.2)
Total Protein: 6.8 g/dL (ref 6.5–8.1)

## 2022-07-09 LAB — CBC WITH DIFFERENTIAL/PLATELET
Abs Immature Granulocytes: 0.07 10*3/uL (ref 0.00–0.07)
Basophils Absolute: 0 10*3/uL (ref 0.0–0.1)
Basophils Relative: 1 %
Eosinophils Absolute: 0 10*3/uL (ref 0.0–0.5)
Eosinophils Relative: 1 %
HCT: 28.8 % — ABNORMAL LOW (ref 36.0–46.0)
Hemoglobin: 9.2 g/dL — ABNORMAL LOW (ref 12.0–15.0)
Immature Granulocytes: 1 %
Lymphocytes Relative: 18 %
Lymphs Abs: 0.9 10*3/uL (ref 0.7–4.0)
MCH: 30.4 pg (ref 26.0–34.0)
MCHC: 31.9 g/dL (ref 30.0–36.0)
MCV: 95 fL (ref 80.0–100.0)
Monocytes Absolute: 0.6 10*3/uL (ref 0.1–1.0)
Monocytes Relative: 11 %
Neutro Abs: 3.3 10*3/uL (ref 1.7–7.7)
Neutrophils Relative %: 68 %
Platelets: 223 10*3/uL (ref 150–400)
RBC: 3.03 MIL/uL — ABNORMAL LOW (ref 3.87–5.11)
RDW: 20.5 % — ABNORMAL HIGH (ref 11.5–15.5)
WBC: 4.9 10*3/uL (ref 4.0–10.5)
nRBC: 0 % (ref 0.0–0.2)

## 2022-07-09 MED ORDER — PALONOSETRON HCL INJECTION 0.25 MG/5ML
0.2500 mg | Freq: Once | INTRAVENOUS | Status: AC
  Administered 2022-07-09: 0.25 mg via INTRAVENOUS
  Filled 2022-07-09: qty 5

## 2022-07-09 MED ORDER — SODIUM CHLORIDE 0.9 % IV SOLN
200.0000 mg | Freq: Once | INTRAVENOUS | Status: AC
  Administered 2022-07-09: 200 mg via INTRAVENOUS
  Filled 2022-07-09: qty 8

## 2022-07-09 MED ORDER — HEPARIN SOD (PORK) LOCK FLUSH 100 UNIT/ML IV SOLN
500.0000 [IU] | Freq: Once | INTRAVENOUS | Status: AC | PRN
  Administered 2022-07-09: 500 [IU]
  Filled 2022-07-09: qty 5

## 2022-07-09 MED ORDER — PROCHLORPERAZINE MALEATE 10 MG PO TABS
10.0000 mg | ORAL_TABLET | Freq: Once | ORAL | Status: AC
  Administered 2022-07-09: 10 mg via ORAL
  Filled 2022-07-09: qty 1

## 2022-07-09 MED ORDER — SODIUM CHLORIDE 0.9% FLUSH
10.0000 mL | INTRAVENOUS | Status: DC | PRN
  Administered 2022-07-09: 10 mL
  Filled 2022-07-09: qty 10

## 2022-07-09 MED ORDER — SODIUM CHLORIDE 0.9 % IV SOLN
500.0000 mg/m2 | Freq: Once | INTRAVENOUS | Status: AC
  Administered 2022-07-09: 800 mg via INTRAVENOUS
  Filled 2022-07-09: qty 20

## 2022-07-09 MED ORDER — SODIUM CHLORIDE 0.9 % IV SOLN
Freq: Once | INTRAVENOUS | Status: AC
  Filled 2022-07-09: qty 250

## 2022-07-09 NOTE — Progress Notes (Signed)
No concerns for the provider. 

## 2022-07-09 NOTE — Progress Notes (Signed)
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  She smoked about 2 packs of cigarettes per day but quit smoking in 2010.  She presented to the ER withSymptoms of significant back pain MRIs lumbar spine without contrast showed pathologic fracture of the L1 vertebral body with posterior bulging of the cortex resulting in mild spinal canal stenosis.  Diffuse T1 hypointense abnormality in the L1 vertebral body with involvement of bilateral pedicles and transverse processes.  CT chest abdomen and pelvis with contrast showed 1 cm left supraclavicular adenopathy.  1 cm right subcarinal lymph node.  Necrotic 3.1 x 2.4 x 2.9 cm anterior mediastinal mass in the left prevascular region with mild mass effect on the brachiocephalic vein.  Possible phrenic nerve involvement.  Mild acute sigmoid colon diverticulitis.    Patient had biopsy of the left supraclavicular lymph node which was consistent with adenocarcinoma of lung primary.  NGS testing did not show any actionable mutations.  K-ras G12 D mutation detected.  MSI stable.  Patient underwent percutaneous fixation of the L1 vertebral body.  She completed palliative radiation treatment to her spine  Interval history-patient has sporadic episodes of nausea and feels that Compazine and Zofran was not working for her.  She was prescribed olanzapine but the 10 mg dose makes her feel loopy.   She feels better after starting Augmentin.  She was evaluated by ENT for possible vocal cord paralysis.  She will need to see speech pathology again  ECOG PS- 1 Pain scale- 0 Opioid associated constipation- no  Review of systems- Review of Systems  Constitutional:  Positive for malaise/fatigue. Negative for chills, fever and weight loss.  HENT:  Negative for congestion, ear discharge and nosebleeds.   Eyes:  Negative for blurred vision.  Respiratory:  Negative for cough, hemoptysis, sputum production, shortness of breath and wheezing.   Cardiovascular:  Negative for chest pain, palpitations, orthopnea and claudication.  Gastrointestinal:  Negative for abdominal pain, blood in stool, constipation, diarrhea, heartburn, melena, nausea and vomiting.  Genitourinary:  Negative for dysuria, flank pain, frequency, hematuria and urgency.  Musculoskeletal:  Negative for back pain, joint pain and myalgias.  Skin:  Negative for rash.  Neurological:  Negative for dizziness, tingling, focal weakness, seizures, weakness and headaches.  Endo/Heme/Allergies:  Does not bruise/bleed easily.  Psychiatric/Behavioral:  Negative for depression and suicidal ideas. The patient does not have insomnia.       No Known Allergies   Past Medical History:  Diagnosis Date   Arthritis    hands and feet   Cancer associated pain    Cataract    right   Complication of anesthesia    sometimes have a hard time waking up   Diabetes mellitus    Borderline Diabetes   GERD (gastroesophageal reflux disease)    HTN (hypertension)    Hyperlipidemia    Motion sickness    Post corneal transplant    right     Past Surgical History:  Procedure Laterality Date   APPLICATION OF INTRAOPERATIVE CT SCAN  02/15/2022   Procedure: APPLICATION OF INTRAOPERATIVE CT SCAN;  Surgeon: Meade Maw, MD;  Location: [facility] ORS;  Service: Neurosurgery;;   BASAL CELL CARCINOMA EXCISION     Dr. Manley Mason   CARPAL TUNNEL RELEASE      right   COLONOSCOPY WITH PROPOFOL N/A 12/03/2015   Procedure: COLONOSCOPY WITH PROPOFOL;  Surgeon: Manya Silvas, MD;  Location: [facility] ENDOSCOPY;  Service: Endoscopy;  Laterality: N/A;   COLONOSCOPY WITH PROPOFOL N/A 02/22/2020   Procedure: COLONOSCOPY WITH PROPOFOL;  Surgeon: Lesly Rubenstein, MD;  Location: [facility] ENDOSCOPY;  Service: Endoscopy;  Laterality: N/A;   ESOPHAGOGASTRODUODENOSCOPY (EGD) WITH PROPOFOL N/A 02/22/2020   Procedure: ESOPHAGOGASTRODUODENOSCOPY (EGD) WITH PROPOFOL;  Surgeon: Lesly Rubenstein, MD;  Location: [facility] ENDOSCOPY;  Service: Endoscopy;  Laterality: N/A;   ESOPHAGOGASTRODUODENOSCOPY (EGD) WITH PROPOFOL N/A 06/07/2022   Procedure: ESOPHAGOGASTRODUODENOSCOPY (EGD) WITH PROPOFOL;  Surgeon: Lesly Rubenstein, MD;  Location: [facility] ENDOSCOPY;  Service: Endoscopy;  Laterality: N/A;   HAMMER TOE SURGERY Right 05/23/2019   Procedure: HAMMER TOE CORRECTION;  Surgeon: Samara Deist, DPM;  Location: [place];  Service: Podiatry;  Laterality: Right;  Diabetic   IR BONE TUMOR(S)RF ABLATION  04/30/2022   IR IMAGING GUIDED PORT INSERTION  04/14/2022   IR KYPHO LUMBAR INC FX REDUCE BONE BX UNI/BIL CANNULATION INC/IMAGING  04/30/2022   IR RADIOLOGIST EVAL & MGMT  03/24/2022   IR RADIOLOGIST EVAL & MGMT  06/23/2022   OSTECTOMY Right 05/23/2019   Procedure: DOUBLE OSTEOTOMY RIGHT;  Surgeon: Samara Deist, DPM;  Location: [place];  Service: Podiatry;  Laterality: Right;   SKIN CANCER EXCISION      Social History   Socioeconomic History   Marital status: Widowed    Spouse name: Not on file   Number of children: Not on file   Years of education: Not on file   Highest education level: Not on file  Occupational History    Employer: [employer]    Comment: Currently not employed  Tobacco Use   Smoking status: Former    Types: Cigarettes    Quit date: 01/13/2009    Years since quitting: 13.4   Smokeless tobacco: Never  Vaping Use   Vaping Use: Never used   Substance and Sexual Activity   Alcohol use: No   Drug use: No   Sexual activity: Not Currently    Birth control/protection: Post-menopausal  Other Topics Concern   Not on file  Social History Narrative   Not on file   Social Determinants of Health   Financial Resource Strain: Not on file  Food Insecurity: No Food Insecurity (03/31/2022)   Hunger Vital Sign    Worried About Running Out of Food in the Last Year: Never true    Ran Out of Food in the Last Year: Never true  Transportation Needs: No Transportation Needs (03/31/2022)   PRAPARE - Hydrologist (Medical): No    Lack of Transportation (Non-Medical): No  Physical Activity: Not on file  Stress: Not on file  Social Connections: Not on file  Intimate Partner Violence: Not At Risk (02/10/2022)   Humiliation, Afraid, Rape, and Kick questionnaire    Fear of Current or Ex-Partner: No    Emotionally Abused: No    Physically Abused: No    Sexually Abused: No    Family History  Problem Relation Age of Onset   Heart disease Mother    Heart  disease Father    Heart disease Maternal Grandmother    Heart disease Maternal Grandfather    Breast cancer Sister 39     Current Outpatient Medications:    acetaminophen (TYLENOL) 500 MG tablet, Take 500 mg by mouth every 6 (six) hours as needed for moderate pain, mild pain, fever or headache., Disp: , Rfl:    amoxicillin-clavulanate (AUGMENTIN) 875-125 MG tablet, Take 1 tablet by mouth 2 (two) times daily., Disp: 14 tablet, Rfl: 0   apixaban (ELIQUIS) 5 MG TABS tablet, Take 1 tablet (5 mg total) by mouth 2 (two) times daily., Disp: 60 tablet, Rfl: 2   Ascorbic Acid (VITAMIN C) 1000 MG tablet, Take 1,000 mg by mouth daily.  , Disp: , Rfl:    carvedilol (COREG) 3.125 MG tablet, Take 3.125 mg by mouth 2 (two) times daily with a meal., Disp: , Rfl:    cyclobenzaprine (FLEXERIL) 5 MG tablet, Take 1 tablet (5 mg total) by mouth 3 (three) times daily as needed for  muscle spasms., Disp: 30 tablet, Rfl: 0   dexamethasone (DECADRON) 4 MG tablet, TAKE 1 TABLET 2 TIMES DAILY STARTING DAY BEFORE PEMETREXED. THEN TAKE 2 TABS DAILY FOR 3 DAYS STARTING DAY AFTER CARBOPLATIN. TAKE WITH FOOD., Disp: 30 tablet, Rfl: 1   Docusate Calcium (STOOL SOFTENER PO), Take 1 capsule by mouth daily as needed (constipation)., Disp: , Rfl:    folic acid (FOLVITE) 1 MG tablet, Take 1 tablet (1 mg total) by mouth daily. Start 7 days before pemetrexed chemotherapy. Continue until 21 days after pemetrexed completed., Disp: 100 tablet, Rfl: 3   gabapentin (NEURONTIN) 300 MG capsule, Take 1 capsule (300 mg total) by mouth 3 (three) times daily., Disp: 90 capsule, Rfl: 2   lidocaine-prilocaine (EMLA) cream, Apply 1 Application topically as needed., Disp: 30 g, Rfl: 2   LORazepam (ATIVAN) 0.5 MG tablet, Take 1 tablet (0.5 mg total) by mouth every 8 (eight) hours as needed for anxiety., Disp: 30 tablet, Rfl: 0   losartan (COZAAR) 100 MG tablet, Take 100 mg by mouth daily., Disp: , Rfl:    metFORMIN (GLUCOPHAGE) 500 MG tablet, Take by mouth 2 (two) times daily with a meal., Disp: , Rfl:    metoprolol tartrate (LOPRESSOR) 25 MG tablet, Take 1 tablet (25 mg total) by mouth 2 (two) times daily., Disp: 60 tablet, Rfl: 0   Multiple Vitamin (MULTIVITAMIN) capsule, Take 1 capsule by mouth daily.  , Disp: , Rfl:    OLANZapine (ZYPREXA) 10 MG tablet, TAKE 1 TABLET BY MOUTH EVERYDAY AT BEDTIME, Disp: 30 tablet, Rfl: 2   omeprazole (PRILOSEC) 20 MG capsule, Take 1 capsule (20 mg total) by mouth daily., Disp: 90 capsule, Rfl: 3   ondansetron (ZOFRAN) 8 MG tablet, Take 1 tablet (8 mg total) by mouth every 8 (eight) hours as needed for nausea or vomiting. Start on the third day after carboplatin., Disp: 30 tablet, Rfl: 1   oxyCODONE ER (XTAMPZA ER) 18 MG C12A, Take 1 capsule by mouth every 12 (twelve) hours., Disp: 60 capsule, Rfl: 0   Oxycodone HCl 20 MG TABS, Take 0.5-1 tablets (10-20 mg total) by mouth  every 4 (four) hours as needed., Disp: 60 tablet, Rfl: 0   polyethylene glycol (MIRALAX / GLYCOLAX) 17 g packet, Take 17 g by mouth daily., Disp: 14 each, Rfl: 0   prednisoLONE acetate (PRED MILD) 0.12 % ophthalmic suspension, Place 1 drop into both eyes daily., Disp: , Rfl:    prochlorperazine (COMPAZINE) 10 MG tablet, Take  1 tablet (10 mg total) by mouth every 6 (six) hours as needed for nausea or vomiting., Disp: 30 tablet, Rfl: 2   simvastatin (ZOCOR) 40 MG tablet, TAKE 1 TABLET EVERY DAY  AT  6:00 PM, Disp: 90 tablet, Rfl: 3 No current facility-administered medications for this visit.  Facility-Administered Medications Ordered in Other Visits:    sodium chloride flush (NS) 0.9 % injection 10 mL, 10 mL, Intracatheter, PRN, Sindy Guadeloupe, MD, 10 mL at 07/09/22 1105  Physical exam:  Vitals:   07/09/22 0845  BP: (!) 106/51  Pulse: 95  Resp: 18  Temp: (!) 96.8 F (36 C)  TempSrc: Tympanic  SpO2: 97%  Weight: 116 lb 8 oz (52.8 kg)  Height: 5' (1.524 m)   Physical Exam Cardiovascular:     Rate and Rhythm: Normal rate and regular rhythm.     Heart sounds: Normal heart sounds.  Pulmonary:     Effort: Pulmonary effort is normal.     Breath sounds: Normal breath sounds.  Abdominal:     General: Bowel sounds are normal.     Palpations: Abdomen is soft.  Skin:    General: Skin is warm and dry.  Neurological:     Mental Status: She is alert and oriented to person, place, and time.         Latest Ref Rng & Units 07/09/2022    8:28 AM  CMP  Glucose 70 - 99 mg/dL 230   BUN 8 - 23 mg/dL 24   Creatinine 0.44 - 1.00 mg/dL 0.97   Sodium 135 - 145 mmol/L 132   Potassium 3.5 - 5.1 mmol/L 4.0   Chloride 98 - 111 mmol/L 100   CO2 22 - 32 mmol/L 25   Calcium 8.9 - 10.3 mg/dL 8.4   Total Protein 6.5 - 8.1 g/dL 6.8   Total Bilirubin 0.3 - 1.2 mg/dL 0.4   Alkaline Phos 38 - 126 U/L 86   AST 15 - 41 U/L 25   ALT 0 - 44 U/L 18       Latest Ref Rng & Units 07/09/2022    8:28 AM  CBC   WBC 4.0 - 10.5 K/uL 4.9   Hemoglobin 12.0 - 15.0 g/dL 9.2   Hematocrit 36.0 - 46.0 % 28.8   Platelets 150 - 400 K/uL 223     No images are attached to the encounter.  DG SWALLOW FUNC OP MEDICARE SPEECH PATH  Result Date: 07/05/2022 Table formatting from the original result was not included. Modified Barium Swallow Study Patient Details Name: SKYLIN TANGNEY MRN: [id number] Date of Birth: 12-18-1950 Today's Date: 07/05/2022 HPI/PMH: HPI: Patient is a 72 y.o. female with past medical history including diabetes, HTN, HLD, who was hospitalized 02/10/22-02/18/22 with back pain and found to have acute compression fracture of L1, s/p percutaneous fixation. CT chest, abdomen and pelvis revealed centrally necrotic anterior mediastinal mass with phrenic nerve impingement and diaphragmatic dysfunction. Per GI notes, pt noted dysphagia and hoarseness s/p mediastinal biopsy. She was also found to have adenocarcinoma of the lung. Barium Swallow on 03/24/22: "Mild relative narrowing of the distal esophagus just proximal to  the gastroesophageal junction concerning for a mild stricture  restricting the passage of a 13 mm barium tablet.  Mild tertiary contractions of the esophagus as can be seen with mild spasm." EGD on 06/07/22: "No endoscopic abnormality was evident in the esophagus to explain the patient's complaint of dysphagia." Patient reports she is currently taking antibiotics for pneumonia, and  that she gets pneumonia frequently. She was noted to have a cough and wet vocal quality after sipping a cup of water prior to the exam. Clinical Impression: Clinical Impression: Patient presents with multifactorial oropharyngeal and pharyngoesophageal dysphagia. Motor and sensory impairments are noted, with resulting silent aspiration of thin liquids. Oral stage is characterized by slow, disorganized mastication and lingual transport of solid. Swallow initiation occurs at the posterior laryngeal surface of the epiglottis with  liquids. Anterior hyoid excursion, laryngeal closure, and epiglottic deflection are incomplete, which results in frank penetration and aspiration of thin liquid during the swallow which was unsensed. Cued cough was not effective to fully eject aspiration, and was subjectively weak. Strategies including 3 second bolus hold, as well as left and right head turn did not prevent penetration/aspiration. Pharyngeal stripping wave was diminished, and in anterior-posterior view unilateral bulging occurred (left), suggesting unilateral weakness. Appearance of slow clearance through the cervical esophagus, as well as retrograde flow of contrast with most consistencies below the level of the pharyngoesophageal segment. Given hoarse vocal quality, recommend ENT consult for further assessment to determine whether vocal cord weakness or paralysis may be contributing to new onset dysphagia. Recommend pt thicken liquids to nectar consistency, and consume single sips using slow rate. She is at increased risk for an adverse event given frequent (and current) pneumonia, likely consistent aspiration of large volumes, as well as other comorbidities. Recommendations provided in handout form and via teachback to pt, also discussed with her daughter by phone. Should ENT identify focal deficits, pt may benefit from course of OP ST for swallowing and vocal function, including potentially respiratory muscle strength training. Factors that may increase risk of adverse event in presence of aspiration ([place] 2021): Factors that may increase risk of adverse event in presence of aspiration Phineas Douglas & Padilla 2021): Respiratory or GI disease; Poor general health and/or compromised immunity; Frequent aspiration of large volumes Recommendations/Plan: Swallowing Evaluation Recommendations Swallowing Evaluation Recommendations Recommendations: PO diet PO Diet Recommendation: Regular; Dysphagia 3 (Mechanical soft); Mildly thick liquids (Level  2, nectar thick) Liquid Administration via: Cup Medication Administration: Whole meds with puree Supervision: Patient able to self-feed; Intermittent supervision/cueing for swallowing strategies Swallowing strategies  : Slow rate; Small bites/sips Postural changes: Position pt fully upright for meals; Stay upright 30-60 min after meals Oral care recommendations: Oral care QID (4x/day) Recommended consults: Consider ENT consultation Caregiver Recommendations: -- (avoid mixed consistencies) Treatment Plan Treatment Plan Treatment recommendations: Other (comment) Follow-up recommendations: Outpatient SLP Recommendations Comment: Recommend ENT consult, then OP SLP should voice/swallowing therapy be indicated per physical exam and if this is in line with pt's goals of care Functional status assessment: Patient has had a recent decline in their functional status and/or demonstrates limited ability to make significant improvements in function in a reasonable and predictable amount of time. Recommendations Recommendations for follow up therapy are one component of a multi-disciplinary discharge planning process, led by the attending physician.  Recommendations may be updated based on patient status, additional functional criteria and insurance authorization. Assessment: Orofacial Exam: Orofacial Exam Oral Cavity: Oral Hygiene: Xerostomia Oral Cavity - Dentition: Adequate natural dentition Orofacial Anatomy: WFL Oral Motor/Sensory Function: Suspected cranial nerve impairment CN V - Trigeminal: WFL CN VII - Facial: WFL CN IX - Glossopharyngeal, CN X - Vagus: -- (hoarse) CN XII - Hypoglossal: WFL Anatomy: Anatomy: WFL Boluses Administered: No data recorded Oral Impairment Domain: Oral Impairment Domain Bolus preparation/mastication: Slow prolonged chewing/mashing with complete recollection Initiation of pharyngeal swallow : Posterior laryngeal  surface of the epiglottis  Pharyngeal Impairment Domain: Pharyngeal Impairment  Domain Soft palate elevation: No bolus between soft palate (SP)/pharyngeal wall (PW) Laryngeal elevation: Partial superior movement of thyroid cartilage/partial approximation of arytenoids to epiglottic petiole Anterior hyoid excursion: Partial anterior movement Epiglottic movement: Partial inversion Laryngeal vestibule closure: Incomplete, narrow column air/contrast in laryngeal vestibule Pharyngeal stripping wave : Present - diminished Pharyngeal contraction (A/P view only): Unilateral bulging Pharyngoesophageal segment opening: Partial distention/partial duration, partial obstruction of flow Tongue base retraction: Trace column of contrast or air between tongue base and PPW Pharyngeal residue: Trace residue within or on pharyngeal structures Location of pharyngeal residue: Tongue base; Valleculae  Esophageal Impairment Domain: Esophageal Impairment Domain Esophageal clearance upright position: Esophageal retention with retrograde flow below pharyngoesophageal segment (PES) Pill: Esophageal Impairment Domain Esophageal clearance upright position: Esophageal retention with retrograde flow below pharyngoesophageal segment (PES) Penetration/Aspiration Scale Score: No data recorded Compensatory Strategies: Compensatory Strategies Compensatory strategies: Yes Left head turn: Ineffective Ineffective Left Head Turn: Thin liquid (Level 0) Right head turn: Ineffective Ineffective Right Head Turn: Thin liquid (Level 0) Oral bolus hold: Ineffective   General Information: Caregiver present: No  Diet Prior to this Study: Regular; Thin liquids (Level 0)   Temperature : -- (unknown; pt reports she currently is taking antibiotics for PNA)   Respiratory Status: Other (comment) (appears WFL, reports she has some breathing difficulties)   Supplemental O2: None (Room air)   History of Recent Intubation: No  Behavior/Cognition: Alert; Cooperative Self-Feeding Abilities: Able to self-feed Baseline vocal quality/speech: Hypophonia/low  volume Volitional Cough: Able to elicit Volitional Swallow: Able to elicit Exam Limitations: No limitations Goal Planning: Prognosis for improved oropharyngeal function: Fair Barriers to Reach Goals: Other (Comment) (comorbidities) No data recorded No data recorded Consulted and agree with results and recommendations: Patient; Other (comment) (daughter, via phone) Pain: Pain Assessment Pain Assessment: No/denies pain End of Session: Start Time:SLP Start Time (ACUTE ONLY): 1400 Stop Time: SLP Stop Time (ACUTE ONLY): 1435 Time Calculation:SLP Time Calculation (min) (ACUTE ONLY): 35 min Charges: SLP Evaluations $ SLP Speech Visit: 1 Visit SLP Evaluations $Outpatient MBS Swallow: 1 Procedure SLP visit diagnosis: SLP Visit Diagnosis: Dysphagia, oropharyngeal phase (R13.12); Dysphagia, pharyngoesophageal phase (R13.14) Past Medical History: Past Medical History: Diagnosis Date  Arthritis   hands and feet  Cancer associated pain   Cataract   right  Complication of anesthesia   sometimes have a hard time waking up  Diabetes mellitus   Borderline Diabetes  GERD (gastroesophageal reflux disease)   HTN (hypertension)   Hyperlipidemia   Motion sickness   Post corneal transplant   right Past Surgical History: Past Surgical History: Procedure Laterality Date  APPLICATION OF INTRAOPERATIVE CT SCAN  [id number]  Procedure: APPLICATION OF INTRAOPERATIVE CT SCAN;  Surgeon: Meade Maw, MD;  Location: [facility] ORS;  Service: Neurosurgery;;  BASAL CELL CARCINOMA EXCISION    Dr. Manley Mason  CARPAL TUNNEL RELEASE    right  COLONOSCOPY WITH PROPOFOL N/A 12/03/2015  Procedure: COLONOSCOPY WITH PROPOFOL;  Surgeon: Manya Silvas, MD;  Location: [facility] ENDOSCOPY;  Service: Endoscopy;  Laterality: N/A;  COLONOSCOPY WITH PROPOFOL N/A 02/22/2020  Procedure: COLONOSCOPY WITH PROPOFOL;  Surgeon: Lesly Rubenstein, MD;  Location: [facility] ENDOSCOPY;  Service: Endoscopy;  Laterality: N/A;  ESOPHAGOGASTRODUODENOSCOPY (EGD) WITH PROPOFOL N/A 02/22/2020   Procedure: ESOPHAGOGASTRODUODENOSCOPY (EGD) WITH PROPOFOL;  Surgeon: Lesly Rubenstein, MD;  Location: [facility] ENDOSCOPY;  Service: Endoscopy;  Laterality: N/A;  ESOPHAGOGASTRODUODENOSCOPY (EGD) WITH PROPOFOL N/A 06/07/2022  Procedure: ESOPHAGOGASTRODUODENOSCOPY (EGD) WITH PROPOFOL;  Surgeon: Haig Prophet,  Hilton Cork, MD;  Location: [facility] ENDOSCOPY;  Service: Endoscopy;  Laterality: N/A;  HAMMER TOE SURGERY Right 05/23/2019  Procedure: HAMMER TOE CORRECTION;  Surgeon: Samara Deist, DPM;  Location: [place];  Service: Podiatry;  Laterality: Right;  Diabetic  IR BONE TUMOR(S)RF ABLATION  04/30/2022  IR IMAGING GUIDED PORT INSERTION  04/14/2022  IR KYPHO LUMBAR INC FX REDUCE BONE BX UNI/BIL CANNULATION INC/IMAGING  04/30/2022  IR RADIOLOGIST EVAL & MGMT  03/24/2022  IR RADIOLOGIST EVAL & MGMT  06/23/2022  OSTECTOMY Right 05/23/2019  Procedure: DOUBLE OSTEOTOMY RIGHT;  Surgeon: Samara Deist, DPM;  Location: [place];  Service: Podiatry;  Laterality: Right;  SKIN CANCER EXCISION   Aliene Altes 07/05/2022, 4:03 PM Deneise Lever, MS, CCC-SLP Speech-Language Pathologist [phone] CLINICAL DATA:  Or pharyngeal dysphagia. EXAM: MODIFIED BARIUM SWALLOW TECHNIQUE: Different consistencies of barium were administered orally to the patient by the Speech Pathologist. Imaging of the pharynx was performed in the lateral projection. The radiologist was present in the fluoroscopy room for this study, providing personal supervision. FLUOROSCOPY: Radiation Exposure Index (as provided by the fluoroscopic device): 23.9 mGy COMPARISON:  None Available. FINDINGS: Vestibular Penetration: Flash penetration with nectar thick consistency. Aspiration: Aspiration with thin consistency which persists with head turning and without a cough reflex. Otherwise no aspiration. Other:  None. IMPRESSION: Modified barium swallow as described above. Please refer to the Speech Pathologists report for complete details and recommendations.  Electronically Signed   By: Kathreen Devoid M.D.   On: 07/05/2022 13:46  CT CHEST ABDOMEN PELVIS W CONTRAST  Result Date: 07/05/2022 CLINICAL DATA:  Right-sided lung cancer. Restaging. * Tracking Code: BO * EXAM: CT CHEST, ABDOMEN, AND PELVIS WITH CONTRAST TECHNIQUE: Multidetector CT imaging of the chest, abdomen and pelvis was performed following the standard protocol during bolus administration of intravenous contrast. RADIATION DOSE REDUCTION: This exam was performed according to the departmental dose-optimization program which includes automated exposure control, adjustment of the mA and/or kV according to patient size and/or use of iterative reconstruction technique. CONTRAST:  171mL OMNIPAQUE IOHEXOL 300 MG/ML  SOLN COMPARISON:  Chest CTA 03/31/2022. Chest abdomen pelvis CT 02/10/2022. FINDINGS: CT CHEST FINDINGS Cardiovascular: The heart size is normal. No substantial pericardial effusion. Coronary artery calcification is evident. Mild atherosclerotic calcification is noted in the wall of the thoracic aorta. Right-sided Port-A-Cath tip is positioned in the right atrium. Mediastinum/Nodes: Anterior mediastinal mass seen 03/31/2022 has decreased in the interval measuring 1.6 x 1.4 cm today compared to 3.2 x 3.2 cm previously. 14 mm short axis subcarinal node seen on that study has decreased to 8 mm today (image 30/2). There is no hilar lymphadenopathy. The esophagus has normal imaging features. There is no axillary lymphadenopathy. Lungs/Pleura: The patchy and nodular ground-glass opacity seen scattered through both lungs previously has decreased substantially in the interval and resolved in some regions. Clustered nodularity in the posterior right upper lobe noted with 5 mm nodule seen on image 34/4. Many of the numerous tiny bilateral pulmonary nodules seen previously appear to have resolved in the interval. Consolidative disease seen in the left lower lobe previously has improved with persistent 3.4 x 1.5  cm consolidative lesion on 71/4. Peripheral tree-in-bud nodularity is seen in the inferior right middle lobe and right lower lobe (see image 97/4). No pleural effusion Musculoskeletal: Sclerosis in the posterior right ninth rib (94/4) is in area of lucency on the previous study suggesting sclerosis from interval healing. CT ABDOMEN PELVIS FINDINGS Hepatobiliary: Stable small cyst posterior right liver.  No followup imaging is recommended. There is no evidence for gallstones, gallbladder wall thickening, or pericholecystic fluid. No intrahepatic or extrahepatic biliary dilation. Pancreas: No focal mass lesion. No dilatation of the main duct. No intraparenchymal cyst. No peripancreatic edema. Spleen: No splenomegaly. No focal mass lesion. Adrenals/Urinary Tract: No adrenal nodule or mass. Kidneys unremarkable. No evidence for hydroureter. The urinary bladder appears normal for the degree of distention. Stomach/Bowel: Stomach is unremarkable. No gastric wall thickening. No evidence of outlet obstruction. Duodenum is normally positioned as is the ligament of Treitz. No small bowel wall thickening. No small bowel dilatation. No gross colonic mass. No colonic wall thickening. Diverticular changes are noted in the left colon without evidence of diverticulitis. Vascular/Lymphatic: There is moderate atherosclerotic calcification of the abdominal aorta without aneurysm. There is no gastrohepatic or hepatoduodenal ligament lymphadenopathy. No retroperitoneal or mesenteric lymphadenopathy. No pelvic sidewall lymphadenopathy. Reproductive: Unremarkable. Other: No intraperitoneal free fluid. Musculoskeletal: No worrisome lytic or sclerotic osseous abnormality. Thoracolumbar fusion hardware again noted. IMPRESSION: 1. Interval decrease in size of the anterior mediastinal mass and subcarinal lymph node. 2. Marked interval improvement in the patchy and nodular ground-glass opacity seen scattered through both lungs previously. These  changes are resolved in some regions. 3. Clustered nodularity in the posterior right upper lobe with 5 mm nodule seen. Tree-in-bud nodularity noted right middle and right lower lobes peripherally. 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 4. Many of the numerous tiny bilateral pulmonary nodules seen previously appear to have resolved in the interval. Consolidative disease seen in the left lower lobe previously has improved with persistent 3.4 x 1.5 cm consolidative area on today's study. 5. Sclerosis in the posterior right ninth rib is in area of loop is in an area of lucency on the previous study suggesting healing metastatic lesion. 6. No evidence for metastatic disease in the abdomen or pelvis. 7. Left colonic diverticulosis without diverticulitis. 8.  Aortic Atherosclerosis (ICD10-I70.0). Electronically Signed   By: Misty Stanley M.D.   On: 07/05/2022 10:01   NM Bone Scan Whole Body  Result Date: 07/01/2022 CLINICAL DATA:  Stage IV RIGHT lung cancer, BILATERAL hip pain, no recent injuries EXAM: NUCLEAR MEDICINE WHOLE BODY BONE SCAN TECHNIQUE: Whole body anterior and posterior images were obtained approximately 3 hours after intravenous injection of radiopharmaceutical. RADIOPHARMACEUTICALS:  23.14 mCi Technetium-29m MDP IV COMPARISON:  None available Radiographic correlation: CT chest 03/31/2022 FINDINGS: Uptake at shoulders, sternoclavicular joints, hips, typically degenerative. Abnormal tracer uptake at posterior RIGHT fifth and ninth ribs and posterolateral LEFT ninth rib, concerning for osseous metastases. Uptake at LEFT hemipelvis likely representing osseous metastasis. Uptake in upper lumbar spine at L1, may be related to orthopedic hardware, patient post spinal fixation. No other worrisome sites of tracer accumulation. Degenerative type uptake of tracer at shoulders, sternoclavicular joints, hips. Expected urinary tract and soft tissue distribution of tracer. IMPRESSION: Abnormal  foci of uptake at three ribs and at LEFT hemipelvis consistent with osseous metastases. Electronically Signed   By: Lavonia Dana M.D.   On: 07/01/2022 16:41   IR Radiologist Eval & Mgmt  Result Date: 06/23/2022 EXAM: ESTABLISHED PATIENT OFFICE VISIT CHIEF COMPLAINT: See below HISTORY OF PRESENT ILLNESS: See below REVIEW OF SYSTEMS: See below PHYSICAL EXAMINATION: See below ASSESSMENT AND PLAN: Please refer to completed note in the electronic medical record on [place] Mug[place]u, MD [facility] Electronically Signed   By: Michaelle Birks M.D.   On: 06/23/2022 15:21  Assessment and plan- Patient is a 72 y.o. female with metastatic lung cancer with bone metastases.  She is here for on treatment assessment prior to cycle 1 of Alimta Keytruda chemotherapy  I have reviewed CT chest abdomen and pelvis andIs independently and bone scan discussed findings with the patient which shows overall response to treatment and decrease in the size of anterior mediastinal mass as well as subcarinal adenopathy.  Nodular groundglass opacities have improved in both the lungs.  Resolution of previously noted tiny bilateral pulmonary nodules.  No evidence of metastatic disease in the abdomen and pelvis.  Bone scan shows stable areas of metastases.  Plan is to now move forward with Alimta and Keytruda alone every 3 weeks without carboplatin until progression or toxicity.  Counts okay to proceed with cycle 1 of maintenance treatment today.  I will see her back in 3 weeks for cycle 2 of Alimta and Keytruda.  B12 injection next week.  Patient does not wish to proceed with Zometa further at this point.  Chemo-induced nausea: Hopefully should improve now that she is not getting carboplatin.  I have asked her to take half the dose of olanzapine at 5 mg at night and see if it will help.  She will also get Aloxi today.  Clinically patient has improved with regards  to her respiratory symptoms I do not think that she needs any further Augmentin at this time   Visit Diagnosis 1. Primary malignant neoplasm of right lung metastatic to other site [facility])   2. Hemoglobin low   3. Encounter for antineoplastic chemotherapy   4. Encounter for antineoplastic immunotherapy      Dr. Randa Evens, MD, MPH [facility] [id number] 07/09/2022 3:16 PM

## 2022-07-09 NOTE — Patient Instructions (Signed)
[facility] [place] [facility]  Discharge Instructions: Thank you for choosing [facility] to provide your oncology and hematology care.  If you have a lab appointment with the Cancer Center, please go directly to the Cancer Center and check in at the registration area.  Wear comfortable clothing and clothing appropriate for easy access to any Portacath or PICC line.   We strive to give you quality time with your provider. You may need to reschedule your appointment if you arrive late (15 or more minutes).  Arriving late affects you and other patients whose appointments are after yours.  Also, if you miss three or more appointments without notifying the office, you may be dismissed from the clinic at the provider's discretion.      For prescription refill requests, have your pharmacy contact our office and allow 72 hours for refills to be completed.     To help prevent nausea and vomiting after your treatment, we encourage you to take your nausea medication as directed.  BELOW ARE SYMPTOMS THAT SHOULD BE REPORTED IMMEDIATELY: *FEVER GREATER THAN 100.4 F (38 C) OR HIGHER *CHILLS OR SWEATING *NAUSEA AND VOMITING THAT IS NOT CONTROLLED WITH YOUR NAUSEA MEDICATION *UNUSUAL SHORTNESS OF BREATH *UNUSUAL BRUISING OR BLEEDING *URINARY PROBLEMS (pain or burning when urinating, or frequent urination) *BOWEL PROBLEMS (unusual diarrhea, constipation, pain near the anus) TENDERNESS IN MOUTH AND THROAT WITH OR WITHOUT PRESENCE OF ULCERS (sore throat, sores in mouth, or a toothache) UNUSUAL RASH, SWELLING OR PAIN  UNUSUAL VAGINAL DISCHARGE OR ITCHING   Items with * indicate a potential emergency and should be followed up as soon as possible or go to the Emergency Department if any problems should occur.  Please show the CHEMOTHERAPY ALERT CARD or IMMUNOTHERAPY ALERT CARD at check-in to the Emergency Department and triage nurse.  Should you have questions after your visit  or need to cancel or reschedule your appointment, please contact [facility] [place] [facility]  [phone] and follow the prompts.  Office hours are 8:00 a.m. to 4:30 p.m. Monday - Friday. Please note that voicemails left after 4:00 p.m. may not be returned until the following business day.  We are closed weekends and major holidays. You have access to a nurse at all times for urgent questions. Please call the main number to the clinic [phone] and follow the prompts.  For any non-urgent questions, you may also contact your provider using MyChart. We now offer e-Visits for anyone 18 and older to request care online for non-urgent symptoms. For details visit [email or web address].   Also download the MyChart app! Go to the app store, search "MyChart", open the app, select [place], and log in with your MyChart username and password.    

## 2022-07-17 ENCOUNTER — Other Ambulatory Visit: Payer: Self-pay | Admitting: Oncology

## 2022-07-19 ENCOUNTER — Encounter: Payer: Self-pay | Admitting: Oncology

## 2022-07-19 ENCOUNTER — Other Ambulatory Visit: Payer: Self-pay | Admitting: *Deleted

## 2022-07-19 ENCOUNTER — Telehealth: Payer: Medicare HMO | Admitting: Hospice and Palliative Medicine

## 2022-07-19 MED ORDER — XTAMPZA ER 18 MG PO C12A: 1.0000 | EXTENDED_RELEASE_CAPSULE | Freq: Two times a day (BID) | ORAL | 0 refills | Status: DC

## 2022-07-21 ENCOUNTER — Ambulatory Visit: Payer: Medicare HMO | Attending: Unknown Physician Specialty | Admitting: Speech Pathology

## 2022-07-21 DIAGNOSIS — R1312 Dysphagia, oropharyngeal phase: Secondary | ICD-10-CM | POA: Diagnosis present

## 2022-07-21 DIAGNOSIS — R1313 Dysphagia, pharyngeal phase: Secondary | ICD-10-CM | POA: Diagnosis present

## 2022-07-21 DIAGNOSIS — R490 Dysphonia: Secondary | ICD-10-CM

## 2022-07-21 NOTE — Therapy (Signed)
OUTPATIENT SPEECH LANGUAGE PATHOLOGY EVALUATION   Patient Name: Amber Glenn MRN: [id number] DOB:April 26, 1950, 72 y.o., female Today's Date: 07/21/2022  PCP: Joen Laura, MD REFERRING PROVIDER: Linus Salmons, MD   End of Session - 07/21/22 1449     Visit Number 1    Number of Visits 17    Date for SLP Re-Evaluation 09/15/22    Authorization Type [employer] H[employer]    Progress Note Due on Visit 10    SLP Start Time 1450    SLP Stop Time  1545    SLP Time Calculation (min) 55 min    Activity Tolerance Patient tolerated treatment well             Past Medical History:  Diagnosis Date   Arthritis    hands and feet   Cancer associated pain    Cataract    right   Complication of anesthesia    sometimes have a hard time waking up   Diabetes mellitus    Borderline Diabetes   GERD (gastroesophageal reflux disease)    HTN (hypertension)    Hyperlipidemia    Motion sickness    Post corneal transplant    right   Past Surgical History:  Procedure Laterality Date   APPLICATION OF INTRAOPERATIVE CT SCAN  02/15/2022   Procedure: APPLICATION OF INTRAOPERATIVE CT SCAN;  Surgeon: Venetia Night, MD;  Location: [facility];  Service: Neurosurgery;;   BASAL CELL CARCINOMA EXCISION     Dr. Lorn Junes   CARPAL TUNNEL RELEASE     right   COLONOSCOPY WITH PROPOFOL N/A 12/03/2015   Procedure: COLONOSCOPY WITH PROPOFOL;  Surgeon: Scot Jun, MD;  Location: [facility];  Service: Endoscopy;  Laterality: N/A;   COLONOSCOPY WITH PROPOFOL N/A 02/22/2020   Procedure: COLONOSCOPY WITH PROPOFOL;  Surgeon: Regis Bill, MD;  Location: [facility] ENDOSCOPY;  Service: Endoscopy;  Laterality: N/A;   ESOPHAGOGASTRODUODENOSCOPY (EGD) WITH PROPOFOL N/A 02/22/2020   Procedure: ESOPHAGOGASTRODUODENOSCOPY (EGD) WITH PROPOFOL;  Surgeon: Regis Bill, MD;  Location: [facility] ENDOSCOPY;  Service: Endoscopy;  Laterality: N/A;   ESOPHAGOGASTRODUODENOSCOPY (EGD) WITH PROPOFOL N/A 06/07/2022    Procedure: ESOPHAGOGASTRODUODENOSCOPY (EGD) WITH PROPOFOL;  Surgeon: Regis Bill, MD;  Location: [facility] ENDOSCOPY;  Service: Endoscopy;  Laterality: N/A;   HAMMER TOE SURGERY Right 05/23/2019   Procedure: HAMMER TOE CORRECTION;  Surgeon: Gwyneth Revels, DPM;  Location: [facility];  Service: Podiatry;  Laterality: Right;  Diabetic   IR BONE TUMOR(S)RF ABLATION  04/30/2022   IR IMAGING GUIDED PORT INSERTION  04/14/2022   IR KYPHO LUMBAR INC FX REDUCE BONE BX UNI/BIL CANNULATION INC/IMAGING  04/30/2022   IR RADIOLOGIST EVAL & MGMT  03/24/2022   IR RADIOLOGIST EVAL & MGMT  06/23/2022   OSTECTOMY Right 05/23/2019   Procedure: DOUBLE OSTEOTOMY RIGHT;  Surgeon: Gwyneth Revels, DPM;  Location: [facility];  Service: Podiatry;  Laterality: Right;   SKIN CANCER EXCISION     Patient Active Problem List   Diagnosis Date Noted   Iron deficiency anemia 04/16/2022   Acute on chronic respiratory failure with hypoxia 04/01/2022   Multifocal pneumonia 04/01/2022   Superficial thrombophlebitis 04/01/2022   Acute pulmonary embolism 03/31/2022   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 site 03/03/2022   Metastatic adenocarcinoma 02/18/2022   Acute low back pain 02/17/2022   Adenocarcinoma 02/17/2022   Delirium due to another medical condition 02/17/2022   Palliative care encounter 02/17/2022   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  Neoplasm related pain 02/11/2022   Supraclavicular adenopathy 02/11/2022   Mediastinal mass 02/11/2022   Goals of care, counseling/discussion 02/11/2022   Pathologic fracture of lumbar vertebra, initial encounter 02/11/2022   Pathologic fracture 02/10/2022   Pyuria 02/10/2022   Diarrhea    COVID-19 08/18/2019   Thumb pain 06/26/2015   Medicare annual wellness visit, subsequent 03/14/2015   Arthralgia 09/02/2014   Hot flashes 01/21/2014   Left shoulder pain 09/20/2013   Unspecified  constipation 08/30/2012   Routine general medical examination at a health care facility 05/29/2012   Screening for colon cancer 05/29/2012   Hypertension 08/18/2011   Osteoarthritis 03/20/2009   Diabetes mellitus type 2, controlled 11/14/2008   BASAL CELL CARCINOMA, FACE 12/06/2006   Hyperlipidemia 12/06/2006   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   ALLERGIC RHINITIS 12/06/2006   GERD 12/06/2006   DEGENERATIVE JOINT DISEASE, GENERALIZED 12/06/2006    Onset date: 07/13/22 date of referral; 02/11/2022 onset date  REFERRING DIAG: J95.89 (ICD-10-CM) - Other postprocedural complications and disorders of respiratory system, not elsewhere classified R49.0 (ICD-10-CM) - Dysphonia  THERAPY DIAG:  Dysphonia  Dysphagia, pharyngeal phase  Rationale for Evaluation and Treatment Rehabilitation  SUBJECTIVE:   SUBJECTIVE STATEMENT: Pt pleasant,  Pt accompanied by: family member  PERTINENT HISTORY and DIAGNOSTIC FINDINGS:  Amber Glenn is a 72 year old female with multiple medical problems including diabetes, hypertension, hyperlipidemia, depression, metastic adenocarcinoma of the lung (diagnosed 02/2022).   Pt sustained a compression fracture of L1 s/p fixation by neurosurgery (02/10/2022).  Further testing included CT Chest Abdomen Pelvis with Contrast (02/10/2022) that revealed 1. 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 There is also mild left supraclavicular adenopathy. 2. Scattered tree-in-bud reticulonodular opacities in the lungs, right greater than left, characteristic for atypical infectious bronchiolitis. One of the largest nodules is a 12 by 7 by 11 mm nodule in the right lateral costophrenic angle, which could be  malignant or infectious. 3. Subtle abnormal hypoenhancement inferiorly in the pancreatic body measuring about 1.2 by 0.7 by 1.3 cm, along with some indistinct hypoenhancement in the pancreatic head. This could be inflammatory but infiltrative pancreatic adenocarcinoma cannot be excluded. This warrants detailed workup with either pancreatic protocol CT or MRI with and without contrast. PET-CT could also be helpful in this case. 4. As shown on lumbar spine MRI, there is a pathologic (malignant) fracture at L1 with diffuse abnormal lysis involving the vertebral body, bilateral pedicles, and bilateral transverse processes at this level with about 65% of vertebral body height and with a posterior bulging contour of the vertebral body by about 5-6 mm. Further detail on dedicated lumbar spine CT. 5. Mild acute sigmoid colon diverticulitis. Tumor in the vicinity of the local inflammation along the sigmoid colon is not totally excluded but is a less likely differential diagnostic consideration. 6. Aortic atherosclerosis.  Coronary atherosclerosis. Pt underwent an ultrasound-guided core biopsy of "hypoechoic, abnormal appearing lymph node just lateral to the left internal jugular vein measuring approximately 1.2x1.0x1.2 cm." Results positive for lung adenocarcinoma. Pt underwent Palliative radiation to spine as well as combination of chemotherapy, immunotherapy and Keytruda.   Following biopsy of lymph node, pt began experiencing dysphagia and hoarseness. She had a Barium Swallow on 03/24/22: "Mild relative narrowing of the distal esophagus just proximal to  the gastroesophageal junction concerning for a mild stricture  restricting the passage of a 13 mm barium tablet.  Mild tertiary contractions of the esophagus as can be seen with mild spasm." EGD on 06/07/22: "No endoscopic abnormality was evident in the esophagus to explain the patient's complaint of dysphagia." MBSS on 07/05/2022 : " multifactorial oropharyngeal and  pharyngoesophageal dysphagia. Motor and sensory impairments are noted, with resulting silent aspiration of thin liquids. Anterior hyoid excursion, laryngeal closure, and epiglottic deflection are incomplete, which results in frank penetration and aspiration of thin liquid during the swallow which was unsensed. Cued cough was not effective to fully eject aspiration, and was subjectively weak. Strategies including 3 second bolus hold, as well as left and right head turn did not prevent penetration/aspiration. Pharyngeal stripping wave was diminished, and in anterior-posterior view unilateral bulging occurred (left), suggesting unilateral weakness. 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 No   FALLS: Has patient fallen in last 6 months? Yes, Number of falls: 2  LIVING ENVIRONMENT: Lives with: lives with their family Lives in: House/apartment  PLOF: Independent  PATIENT GOALS "to drink thin liquids; being able to speak and talk plain again"  OBJECTIVE:   COGNITION: Overall cognitive status: Within functional limits for tasks assessed  SOCIAL HISTORY: Occupation: retired Counsellor intake: suboptimal Caffeine/alcohol intake: minimal Daily voice use: moderate Environmental risks: Other: currently undergoing chemotherapy, vomiting Occupational risks: None identified Misuse: Strain, Speaks without adequate breath support, and Speaks on residual capacity Phonotraumatic behaviors: Excessive and/or habitual throat clearing  PERCEPTUAL VOICE ASSESSMENT: Voice quality: hoarse, breathy, rough, and strained Vocal abuse: habitual throat clearing, abnormal breathing pattern, and habitual abnormal pitch Resonance: normal Respiratory function: speaking on residual capacity  OBJECTIVE VOICE ASSESSMENT: Sustained "ah"  maximum phonation time: 76.3 seconds Sustained "ah" loudness average: 76.3 dB Average fundamental frequency during sustained "ah":198.3 Hz   (1.6 SD below average of  244 Hz +/- 27 for gender)  Oral reading (passage) loudness average: 78 dB Oral reading loudness range: 14 dB Conversational pitch average: 227 Hz Highest dynamic pitch in conversational speech: 315 Hz Lowest dynamic pitch in conversational speech: 99 Hz Conversational pitch range: 216 Hz Conversational loudness average: 67 dB Conversational loudness range: 31 dB S/z ratio: 1.4 (Suggestive of dysfunction >1.0)   ORAL MOTOR EXAMINATION Facial : WFL Lingual: WFL Velum: WFL Mandible: WFL Cough: Non-Productive Voice: Hoarse, Strained, Breathy, Weak   PATIENT REPORTED OUTCOME MEASURES (PROM):  VOICE HANDICAP INDEX (VHI)  The Voice Handicap Index is comprised of a series of questions to assess the patient's perception of their voice. It is designed to evaluate the emotional, physical and functional components of the voice problem.  Functional: 17 Physical: 35 Emotional: 18 Total: 68 (Normal mean 8.75, SD =14.97)  z score =  3.9 - severe = 3.00+   EATING ASSESSMENT TOOL (EAT-10)   The patient was asked to rate to what extent the following statements are problematic on a scale of 0-4. 0 = No problem; 4 = Severe problem. A total score of 3 or higher is considered abnormal.  1.) My swallowing problem has caused me to lose weight. 4  2.) My swallowing problem interferes with my ability to go out for meals. 2 3.) Swallowing liquids takes extra effort. 4  4.) Swallowing solids takes extra effort. 4   5.) Swallowing  pills takes extra effort. 4  6.) Swallowing is painful. 4  7.) The pleasure of eating is affected by my swallowing. 4  8.) When I swallow food sticks in my throat. 3 9.) I cough when I eat. 4  10.) Swallowing is stressful. 4    TOTAL SCORE: 37     TODAY'S TREATMENT:  N/A   PATIENT  EDUCATION: Education details: results of this assessment, ST POC, nectar thick liquids - including avoiding jello, milkshakes, smoothies and ice cream Person educated: Patient and Child(ren) Education method: Explanation Education comprehension: verbalized understanding and needs further education   HOME EXERCISE PROGRAM: N/A     GOALS: Goals reviewed with patient? Yes  SHORT TERM GOALS: Target date: 10 sessions  To determine optimal resistance levels for Respiratory Muscle Training (RMT) for improving increase hyolaryngeal elevation and strengthen cough for airway clearance, patient will participate in evaluation (and re-assessment as needed) of maximum expiratory pressure (MEP) and maximum inspiratory pressure (MIP) at next therapy session. Baseline: Goal status: INITIAL  2.  The patient will eliminate phonotraumatic behaviors such as chronic throat clearing, by substituting non-traumatic methods to clear mucus. Baseline:  Goal status: INITIAL  LONG TERM GOALS: Target date: 09/15/2022  Patient will improve perception of swallowing as indicated by an improvement in EAT-10 score to 5 (Baseline = 37) by 12 weeks from initial swallowing therapy session. Baseline:  Goal status: INITIAL  2.  Patient will consume recommended diet using strategies and compensations without overt s/sx aspiration >95% of the time. Baseline:  Goal status: INITIAL  3.  Patient will participate in objective swallowing evaluation (MBSS) to identify safest diet recommendation as well as therapeutic targets prior to discharge from services. Baseline:  Goal status: INITIAL  4.  The patient will increase hydration for an eventual goal of 6-8 glasses per day and limit caffeine intake (to maximum of 1-2, 8 oz cups/day), as measured by patient report. Baseline:  Goal status: INITIAL    ASSESSMENT:  CLINICAL IMPRESSION: Patient is a 72 y.o. female who was seen today for clinical swallow and voice  evaluation. 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 As an additional result of her vocal fold paresis, she has confirmed silent aspiration of thin liquids on most recent Modified Barium Swallow study.    OBJECTIVE IMPAIRMENTS include voice disorder and dysphagia. These impairments are limiting patient from managing appointments, household responsibilities, ADLs/IADLs, effectively communicating at home and in community, and safety when swallowing. Factors affecting potential to achieve goals and functional outcome are severity of impairments.. Patient will benefit from skilled SLP services to address above impairments and improve overall function.  REHAB POTENTIAL: Fair left vocal cord paresis  PLAN: SLP FREQUENCY: 1-2x/week  SLP DURATION: 8 weeks  PLANNED INTERVENTIONS: Aspiration precaution training, Pharyngeal strengthening exercises, Diet toleration management , Trials of upgraded texture/liquids, SLP instruction and feedback, and Patient/family education   Sheree Lalla B. Dreama Saa, M.S., CCC-SLP, Tree surgeon Certified Brain Injury Specialist [facility]  [facility] Services Office [phone] Ascom [phone] Fax [phone]

## 2022-07-24 ENCOUNTER — Other Ambulatory Visit: Payer: Self-pay | Admitting: Oncology

## 2022-07-24 DIAGNOSIS — R112 Nausea with vomiting, unspecified: Secondary | ICD-10-CM

## 2022-07-26 ENCOUNTER — Encounter: Payer: Self-pay | Admitting: Oncology

## 2022-07-26 ENCOUNTER — Other Ambulatory Visit: Payer: Self-pay | Admitting: *Deleted

## 2022-07-26 ENCOUNTER — Telehealth: Payer: Self-pay | Admitting: *Deleted

## 2022-07-26 MED ORDER — OXYCODONE HCL 20 MG PO TABS: 10.0000 mg | ORAL_TABLET | ORAL | 0 refills | Status: DC | PRN

## 2022-07-26 NOTE — Telephone Encounter (Signed)
ok 

## 2022-07-26 NOTE — Telephone Encounter (Signed)
Pt's daughter called in to ask if pt can receive IV steroids at next treatment (4/19) like she has previously. States that pt feels better in the evening after receiving and could tell she did not receive it last treatment due to not feeling well. Pt is planning on attending grandson's senior night the evening after her treatment and does not want to miss if possible due to not feeling well.   Please advise.

## 2022-07-27 ENCOUNTER — Telehealth: Payer: Self-pay | Admitting: *Deleted

## 2022-07-27 ENCOUNTER — Ambulatory Visit: Payer: Medicare HMO | Admitting: Speech Pathology

## 2022-07-27 DIAGNOSIS — R490 Dysphonia: Secondary | ICD-10-CM | POA: Diagnosis not present

## 2022-07-27 DIAGNOSIS — R1312 Dysphagia, oropharyngeal phase: Secondary | ICD-10-CM

## 2022-07-27 MED ORDER — AMOXICILLIN-POT CLAVULANATE 875-125 MG PO TABS: 1.0000 | ORAL_TABLET | Freq: Two times a day (BID) | ORAL | 0 refills | Status: DC

## 2022-07-27 NOTE — Telephone Encounter (Signed)
Pt's daughter called in to report that pt is having productive cough and greenish sputum Pt's daughter didn't know if she needed to be started on an antibiotic or not.   Please advise.

## 2022-07-27 NOTE — Therapy (Signed)
OUTPATIENT SPEECH LANGUAGE PATHOLOGY TREATMENT NOTE   Patient Name: Amber Glenn MRN: [id number] DOB:[birth year], 72 y.o., female Today's Date: 07/27/2022  PCP: Amber Laura, MD  REFERRING PROVIDER: Linus Salmons, MD   END OF SESSION:   End of Session - 07/27/22 1429     Visit Number 2    Number of Visits 17    Date for SLP Re-Evaluation 09/15/22    Authorization Type [employer]    Progress Note Due on Visit 10    SLP Start Time 1300    SLP Stop Time  1345    SLP Time Calculation (min) 45 min    Activity Tolerance Patient tolerated treatment well             Past Medical History:  Diagnosis Date   Arthritis    hands and feet   Cancer associated pain    Cataract    right   Complication of anesthesia    sometimes have a hard time waking up   Diabetes mellitus    Borderline Diabetes   GERD (gastroesophageal reflux disease)    HTN (hypertension)    Hyperlipidemia    Motion sickness    Post corneal transplant    right   Past Surgical History:  Procedure Laterality Date   APPLICATION OF INTRAOPERATIVE CT SCAN  02/15/2022   Procedure: APPLICATION OF INTRAOPERATIVE CT SCAN;  Surgeon: Venetia Night, MD;  Location: [facility];  Service: Neurosurgery;;   BASAL CELL CARCINOMA EXCISION     Dr. Lorn Junes   CARPAL TUNNEL RELEASE     right   COLONOSCOPY WITH PROPOFOL N/A 12/03/2015   Procedure: COLONOSCOPY WITH PROPOFOL;  Surgeon: Scot Jun, MD;  Location: [facility];  Service: Endoscopy;  Laterality: N/A;   COLONOSCOPY WITH PROPOFOL N/A 02/22/2020   Procedure: COLONOSCOPY WITH PROPOFOL;  Surgeon: Regis Bill, MD;  Location: [facility];  Service: Endoscopy;  Laterality: N/A;   ESOPHAGOGASTRODUODENOSCOPY (EGD) WITH PROPOFOL N/A 02/22/2020   Procedure: ESOPHAGOGASTRODUODENOSCOPY (EGD) WITH PROPOFOL;  Surgeon: Regis Bill, MD;  Location: [facility] END[facility]COPY;  Service: Endoscopy;  Laterality: N/A;   ESOPHAGOGASTRODUODENOSCOPY (EGD) WITH  PROPOFOL N/A 06/07/2022   Procedure: ESOPHAGOGASTRODUODENOSCOPY (EGD) WITH PROPOFOL;  Surgeon: Regis Bill, MD;  Location: [facility];  Service: Endoscopy;  Laterality: N/A;   HAMMER TOE SURGERY Right 05/23/2019   Procedure: HAMMER TOE CORRECTION;  Surgeon: Gwyneth Revels, DPM;  Location: [facility];  Service: Podiatry;  Laterality: Right;  Diabetic   IR BONE TUMOR(S)RF ABLATION  04/30/2022   IR IMAGING GUIDED PORT INSERTION  04/14/2022   IR KYPHO LUMBAR INC FX REDUCE BONE BX UNI/BIL CANNULATION INC/IMAGING  04/30/2022   IR RADIOLOGIST EVAL & MGMT  03/24/2022   IR RADIOLOGIST EVAL & MGMT  06/23/2022   OSTECTOMY Right 05/23/2019   Procedure: DOUBLE OSTEOTOMY RIGHT;  Surgeon: Gwyneth Revels, DPM;  Location: [facility];  Service: Podiatry;  Laterality: Right;   SKIN CANCER EXCISION     Patient Active Problem List   Diagnosis Date Noted   Iron deficiency anemia 04/16/2022   Acute on chronic respiratory failure with hypoxia 04/01/2022   Multifocal pneumonia 04/01/2022   Superficial thrombophlebitis 04/01/2022   Acute pulmonary embolism 03/31/2022   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 site 03/03/2022   Metastatic adenocarcinoma 02/18/2022   Acute low back pain 02/17/2022   Adenocarcinoma 02/17/2022   Delirium due to another medical condition 02/17/2022   Palliative care encounter 02/17/2022   Pathologic lumbar vertebral fracture  02/11/2022   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   Neoplasm related pain 02/11/2022   Supraclavicular adenopathy 02/11/2022   Mediastinal mass 02/11/2022   Goals of care, counseling/discussion 02/11/2022   Pathologic fracture of lumbar vertebra, initial encounter 02/11/2022   Pathologic fracture 02/10/2022   Pyuria 02/10/2022   Diarrhea    COVID-19 08/18/2019   Thumb pain 06/26/2015   Medicare annual wellness visit, subsequent 03/14/2015   Arthralgia 09/02/2014   Hot flashes 01/21/2014   Left shoulder pain 09/20/2013    Unspecified constipation 08/30/2012   Routine general medical examination at a health care facility 05/29/2012   Screening for colon cancer 05/29/2012   Hypertension 08/18/2011   Osteoarthritis 03/20/2009   Diabetes mellitus type 2, controlled 11/14/2008   BASAL CELL CARCINOMA, FACE 12/06/2006   Hyperlipidemia 12/06/2006   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  Pt sustained a compression fracture of L1 s/p fixation by neurosurgery (02/10/2022).  Further testing included CT Chest Abdomen Pelvis with Contrast (02/10/2022) that revealed 1. 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 There is also mild left supraclavicular adenopathy. 2. Scattered tree-in-bud reticulonodular opacities in the lungs, right greater than left, characteristic for atypical infectious bronchiolitis. One of the largest nodules is a 12 by 7 by 11 mm nodule in the right lateral costophrenic angle, which could be malignant or infectious. 3. Subtle abnormal hypoenhancement inferiorly in the pancreatic body measuring about 1.2 by 0.7 by 1.3 cm, along with some indistinct hypoenhancement in the pancreatic  head. This could be inflammatory but infiltrative pancreatic adenocarcinoma cannot be excluded. This warrants detailed workup with either pancreatic protocol CT or MRI with and without contrast. PET-CT could also be helpful in this case. 4. As shown on lumbar spine MRI, there is a pathologic (malignant) fracture at L1 with diffuse abnormal lysis involving the vertebral body, bilateral pedicles, and bilateral transverse processes at this level with about 65% of vertebral body height and with a posterior bulging contour of the vertebral body by about 5-6 mm. Further detail on dedicated lumbar spine CT. 5. Mild acute sigmoid colon diverticulitis. Tumor in the vicinity of the local inflammation along the sigmoid colon is not totally excluded but is a less likely differential diagnostic consideration. 6. Aortic atherosclerosis.  Coronary atherosclerosis. Pt underwent an ultrasound-guided core biopsy of "hypoechoic, abnormal appearing lymph node just lateral to the left internal jugular vein measuring approximately 1.2x1.0x1.2 cm." Results positive for lung adenocarcinoma. Pt underwent Palliative radiation to spine as well as combination of chemotherapy, immunotherapy and Keytruda.    Following biopsy of lymph node, pt began experiencing dysphagia and hoarseness. She had a Barium Swallow on 03/24/22: "Mild relative narrowing of the distal esophagus just proximal to  the gastroesophageal junction concerning for a mild stricture  restricting the passage of a 13 mm barium tablet.  Mild tertiary contractions of the esophagus as can be seen with mild spasm." EGD on 06/07/22: "No endoscopic abnormality was evident in the esophagus to explain the patient's complaint of dysphagia." MBSS on 07/05/2022 : " multifactorial oropharyngeal and pharyngoesophageal dysphagia. Motor and sensory impairments are noted, with resulting silent aspiration of thin liquids. Anterior hyoid excursion, laryngeal closure, and epiglottic deflection are  incomplete, which results in frank penetration and aspiration of thin liquid during the swallow which was unsensed. Cued cough was not effective to fully eject aspiration, and was subjectively weak. Strategies including 3 second bolus hold, as well as left and right head turn did not prevent penetration/aspiration. Pharyngeal stripping wave was diminished, and in anterior-posterior view unilateral bulging occurred (left), suggesting unilateral weakness. 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 No  PATIENT GOALS:   "to drink thin liquids; being able to speak and talk plain again"   OBJECTIVE:   TODAY'S TREATMENT: Skilled treatment session focused on pt's dysphonia goals. SLP facilitated session by providing the following interventions:  In order to facilitate the movement of pt's paralyzed vocal fold, SLP guided pt in utilizing the following strategies to adduct left vocal fold:  Pushing hands against one another, vocalizing a prolonged /a/: Pt unable to maintain a smooth voice when provided max assist, specifically pt struggled to push hands together d/t decreased upper extremity weakness  Pushing hands down against chair, vocalizing a prolonged /a/: Pt unable to maintain a smooth voice when provided max assist.  Deep breathing-Hold-Vocalize:  Pt able to demonstrate improved vocal quality of prolonged /a/, /I/, /e/ and some phrases "how are you? I am fine, let's go." when provided with max faded to min-mod assist to sequence task, benefiting from the use  of audio feedback to improve accuracy.  SLP introduced EMST with use of EMST 75 device. Written provided with pt able to produce a quick burst of air at 35 cmH2O for a self-reported effort level of 5.    PATIENT EDUCATION: Education details: See Above  Person educated: Patient Education method: Explanation Education comprehension: verbalized understanding  HOME EXERCISE PROGRAM:   Hold a deep breath, release a vocalized "U"   EMST (Pharmacologist)     GOALS: Goals reviewed with patient? Yes  SHORT TERM GOALS: Target date: 10 sessions  To determine optimal resistance levels for Respiratory Muscle Training (RMT) for improving increase hyolaryngeal elevation and strengthen cough for airway clearance, patient will participate in evaluation (and re-assessment as needed) of maximum expiratory pressure (MEP) and maximum inspiratory pressure (MIP) at next therapy session.  Baseline: Goal status: INITIAL  2.  The patient will eliminate phonotraumatic behaviors such as chronic throat clearing, by substituting non-traumatic methods to clear mucus.  Baseline:  Goal status: INITIAL    LONG TERM GOALS: Target date:   Patient will improve perception of swallowing as indicated by an improvement in EAT-10 score to 5 (Baseline = 37) by 12 weeks from initial swallowing therapy session. Baseline:  Goal status: INITIAL  2.  Patient will consume  recommended diet using strategies and compensations without overt s/sx aspiration >95% of the time.  Baseline:  Goal status: INITIAL  3.  Patient will participate in objective swallowing evaluation (MBSS) to identify safest diet recommendation as well as therapeutic targets prior to discharge from services.  Baseline:  Goal status: INITIAL  4.  The patient will increase hydration for an eventual goal of 6-8 glasses per day and limit caffeine intake (to maximum of 1-2, 8 oz cups/day), as measured by patient report.  Baseline:   Goal status: INITIAL   ASSESSMENT:  CLINICAL IMPRESSION: Patient is a 72 y.o. female who was seen today for clinical swallow and voice evaluation. 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 As an additional result of her vocal fold paresis, she has confirmed silent aspiration of thin liquids on most recent Modified Barium Swallow study.    OBJECTIVE IMPAIRMENTS include voice disorder and dysphagia. These impairments are limiting patient from managing appointments, household responsibilities, ADLs/IADLs, effectively communicating at home and in community, and safety when swallowing. Factors affecting potential to achieve goals and functional outcome are severity of impairments.. Patient will benefit from skilled SLP services to address above impairments and improve overall function.  REHAB POTENTIAL: Fair left vocal cord paresis   PLAN: SLP FREQUENCY: 1-2x/week  SLP DURATION: 8 weeks  PLANNED INTERVENTIONS: Aspiration precaution training, Pharyngeal strengthening exercises, Diet toleration management , Trials of upgraded texture/liquids, SLP instruction and feedback, and Patient/family education    Georgana Curio, graduate student clinician, [employer] B. Dreama Saa, M.S., CCC-SLP, Tree surgeon Certified Brain Injury Specialist [facility]  [facility] Office [phone] Ascom [phone] Fax [phone]

## 2022-07-27 NOTE — Telephone Encounter (Signed)
Rx has been sent into pt's pharmacy. Daughter updated.

## 2022-07-27 NOTE — Telephone Encounter (Signed)
Ok to send augmentin for 7 days

## 2022-07-29 ENCOUNTER — Ambulatory Visit: Payer: Medicare HMO | Admitting: Speech Pathology

## 2022-07-29 DIAGNOSIS — R1313 Dysphagia, pharyngeal phase: Secondary | ICD-10-CM

## 2022-07-29 DIAGNOSIS — R490 Dysphonia: Secondary | ICD-10-CM | POA: Diagnosis not present

## 2022-07-29 NOTE — Therapy (Addendum)
OUTPATIENT SPEECH LANGUAGE PATHOLOGY TREATMENT NOTE   Patient Name: Amber Glenn MRN: [id number] DOB:1951-01-06, 72 y.o., female Today's Date: 07/29/2022  PCP: Joen Laura, MD  REFERRING PROVIDER: Linus Salmons, MD   END OF SESSION:   End of Session - 07/29/22 1429     Visit Number 3   Number of Visits 17    Date for SLP Re-Evaluation 09/15/22    Authorization Type [employer] H[employer]    Progress Note Due on Visit 10    SLP Start Time 1300    SLP Stop Time  1345    SLP Time Calculation (min) 45 min    Activity Tolerance Patient tolerated treatment well             Past Medical History:  Diagnosis Date   Arthritis    hands and feet   Cancer associated pain    Cataract    right   Complication of anesthesia    sometimes have a hard time waking up   Diabetes mellitus    Borderline Diabetes   GERD (gastroesophageal reflux disease)    HTN (hypertension)    Hyperlipidemia    Motion sickness    Post corneal transplant    right   Past Surgical History:  Procedure Laterality Date   APPLICATION OF INTRAOPERATIVE CT SCAN  02/15/2022   Procedure: APPLICATION OF INTRAOPERATIVE CT SCAN;  Surgeon: Venetia Night, MD;  Location: [facility];  Service: Neurosurgery;;   BASAL CELL CARCINOMA EXCISION     Dr. Lorn Junes   CARPAL TUNNEL RELEASE     right   COLONOSCOPY WITH PROPOFOL N/A 12/03/2015   Procedure: COLONOSCOPY WITH PROPOFOL;  Surgeon: Scot Jun, MD;  Location: [facility];  Service: Endoscopy;  Laterality: N/A;   COLONOSCOPY WITH PROPOFOL N/A 02/22/2020   Procedure: COLONOSCOPY WITH PROPOFOL;  Surgeon: Regis Bill, MD;  Location: [facility];  Service: Endoscopy;  Laterality: N/A;   ESOPHAGOGASTRODUODENOSCOPY (EGD) WITH PROPOFOL N/A 02/22/2020   Procedure: ESOPHAGOGASTRODUODENOSCOPY (EGD) WITH PROPOFOL;  Surgeon: Regis Bill, MD;  Location: [facility];  Service: Endoscopy;  Laterality: N/A;   ESOPHAGOGASTRODUODENOSCOPY (EGD) WITH  PROPOFOL N/A 06/07/2022   Procedure: ESOPHAGOGASTRODUODENOSCOPY (EGD) WITH PROPOFOL;  Surgeon: Regis Bill, MD;  Location: [facility];  Service: Endoscopy;  Laterality: N/A;   HAMMER TOE SURGERY Right 05/23/2019   Procedure: HAMMER TOE CORRECTION;  Surgeon: Gwyneth Revels, DPM;  Location: [facility];  Service: Podiatry;  Laterality: Right;  Diabetic   IR BONE TUMOR(S)RF ABLATION  04/30/2022   IR IMAGING GUIDED PORT INSERTION  04/14/2022   IR KYPHO LUMBAR INC FX REDUCE BONE BX UNI/BIL CANNULATION INC/IMAGING  04/30/2022   IR RADIOLOGIST EVAL & MGMT  03/24/2022   IR RADIOLOGIST EVAL & MGMT  06/23/2022   OSTECTOMY Right 05/23/2019   Procedure: DOUBLE OSTEOTOMY RIGHT;  Surgeon: Gwyneth Revels, DPM;  Location: [facility];  Service: Podiatry;  Laterality: Right;   SKIN CANCER EXCISION     Patient Active Problem List   Diagnosis Date Noted   Iron deficiency anemia 04/16/2022   Acute on chronic respiratory failure with hypoxia 04/01/2022   Multifocal pneumonia 04/01/2022   Superficial thrombophlebitis 04/01/2022   Acute pulmonary embolism 03/31/2022   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 site 03/03/2022   Metastatic adenocarcinoma 02/18/2022   Acute low back pain 02/17/2022   Adenocarcinoma 02/17/2022   Delirium due to another medical condition 02/17/2022   Palliative care encounter 02/17/2022   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   Neoplasm related pain 02/11/2022   Supraclavicular adenopathy 02/11/2022   Mediastinal mass 02/11/2022   Goals of care, counseling/discussion 02/11/2022   Pathologic fracture of lumbar vertebra, initial encounter 02/11/2022   Pathologic fracture 02/10/2022   Pyuria 02/10/2022   Diarrhea    COVID-19 08/18/2019   Thumb pain 06/26/2015   Medicare annual wellness visit, subsequent 03/14/2015   Arthralgia 09/02/2014   Hot flashes 01/21/2014   Left shoulder pain 09/20/2013    Unspecified constipation 08/30/2012   Routine general medical examination at a health care facility 05/29/2012   Screening for colon cancer 05/29/2012   Hypertension 08/18/2011   Osteoarthritis 03/20/2009   Diabetes mellitus type 2, controlled 11/14/2008   BASAL CELL CARCINOMA, FACE 12/06/2006   Hyperlipidemia 12/06/2006   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  Pt sustained a compression fracture of L1 s/p fixation by neurosurgery (02/10/2022).  Further testing included CT Chest Abdomen Pelvis with Contrast (02/10/2022) that revealed 1. 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 There is also mild left supraclavicular adenopathy. 2. Scattered tree-in-bud reticulonodular opacities in the lungs, right greater than left, characteristic for atypical infectious bronchiolitis. One of the largest nodules is a 12 by 7 by 11 mm nodule in the right lateral costophrenic angle, which could be malignant or infectious. 3. Subtle abnormal hypoenhancement inferiorly in the pancreatic body measuring about 1.2 by 0.7 by 1.3 cm, along with some indistinct hypoenhancement in the pancreatic  head. This could be inflammatory but infiltrative pancreatic adenocarcinoma cannot be excluded. This warrants detailed workup with either pancreatic protocol CT or MRI with and without contrast. PET-CT could also be helpful in this case. 4. As shown on lumbar spine MRI, there is a pathologic (malignant) fracture at L1 with diffuse abnormal lysis involving the vertebral body, bilateral pedicles, and bilateral transverse processes at this level with about 65% of vertebral body height and with a posterior bulging contour of the vertebral body by about 5-6 mm. Further detail on dedicated lumbar spine CT. 5. Mild acute sigmoid colon diverticulitis. Tumor in the vicinity of the local inflammation along the sigmoid colon is not totally excluded but is a less likely differential diagnostic consideration. 6. Aortic atherosclerosis.  Coronary atherosclerosis. Pt underwent an ultrasound-guided core biopsy of "hypoechoic, abnormal appearing lymph node just lateral to the left internal jugular vein measuring approximately 1.2x1.0x1.2 cm." Results positive for lung adenocarcinoma. Pt underwent Palliative radiation to spine as well as combination of chemotherapy, immunotherapy and Keytruda.    Following biopsy of lymph node, pt began experiencing dysphagia and hoarseness. She had a Barium Swallow on 03/24/22: "Mild relative narrowing of the distal esophagus just proximal to  the gastroesophageal junction concerning for a mild stricture  restricting the passage of a 13 mm barium tablet.  Mild tertiary contractions  of the esophagus as can be seen with mild spasm." EGD on 06/07/22: "No endoscopic abnormality was evident in the esophagus to explain the patient's complaint of dysphagia." MBSS on 07/05/2022 : " multifactorial oropharyngeal and pharyngoesophageal dysphagia. Motor and sensory impairments are noted, with resulting silent aspiration of thin liquids. Anterior hyoid excursion, laryngeal closure, and epiglottic deflection are  incomplete, which results in frank penetration and aspiration of thin liquid during the swallow which was unsensed. Cued cough was not effective to fully eject aspiration, and was subjectively weak. Strategies including 3 second bolus hold, as well as left and right head turn did not prevent penetration/aspiration. Pharyngeal stripping wave was diminished, and in anterior-posterior view unilateral bulging occurred (left), suggesting unilateral weakness. 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 No  PATIENT GOALS:   "to drink thin liquids; being able to speak and talk plain again"   OBJECTIVE:   TODAY'S TREATMENT: Skilled treatment session focused on pt's dysphonia goals. SLP facilitated session by providing the following interventions:   EMST 75 set at 35 cmH2O - pt able to complete 5 reps before fatigue, moderate faded to min a verbal cues to slow rate of production and pt produce burst of air  Reduced to 15 cmH2O - pt able to complete 10 reps before fatigue with self-perceived effort of 6 out of 10  Phonation with vocal adduction Isolated vowels to simple phrases Moderate cues for deep breath in, hold, say vowel or phrase - At the isolated vowel level with rare Min A pt with improved vocal quality in 28 out of 30 opportunities.    Pt was distractible and required continued redirection to task She also had a new wet congested sounding cough, pt to continue to monitor   PATIENT EDUCATION: Education details: See Above  Person educated:  Patient Education method: Explanation Education comprehension: verbalized understanding  HOME EXERCISE PROGRAM:   Hold a deep breath, release a vocalized "U"   EMST (Pharmacologist)     GOALS: Goals reviewed with patient? Yes  SHORT TERM GOALS: Target date: 10 sessions  To determine optimal resistance levels for Respiratory Muscle Training (RMT) for improving increase hyolaryngeal elevation and strengthen cough for airway clearance, patient will participate in evaluation (and re-assessment as needed) of maximum expiratory pressure (MEP) and maximum inspiratory pressure (MIP) at next therapy session.  Baseline: Goal status: INITIAL  2.  The patient will eliminate phonotraumatic behaviors such as chronic throat clearing, by substituting non-traumatic methods to clear mucus.  Baseline:  Goal status: INITIAL    LONG TERM GOALS: Target date:   Patient will improve perception of swallowing as indicated by an improvement in EAT-10 score to 5 (Baseline = 37) by 12 weeks from initial swallowing therapy session. Baseline:  Goal status: INITIAL  2.  Patient will consume recommended diet using strategies and compensations without overt s/sx aspiration >95% of the time.  Baseline:  Goal status: INITIAL  3.  Patient will participate in objective swallowing evaluation (MBSS) to identify safest diet recommendation as well  as therapeutic targets prior to discharge from services.  Baseline:  Goal status: INITIAL  4.  The patient will increase hydration for an eventual goal of 6-8 glasses per day and limit caffeine intake (to maximum of 1-2, 8 oz cups/day), as measured by patient report.  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 As an additional result of her vocal fold paresis, she has confirmed silent  aspiration of thin liquids on most recent Modified Barium Swallow study. Improved phonation noted with vocal cord adduction exercises.   OBJECTIVE IMPAIRMENTS include voice disorder and dysphagia. These impairments are limiting patient from managing appointments, household responsibilities, ADLs/IADLs, effectively communicating at home and in community, and safety when swallowing. Factors affecting potential to achieve goals and functional outcome are severity of impairments.. Patient will benefit from skilled SLP services to address above impairments and improve overall function.  REHAB POTENTIAL: Fair left vocal cord paresis   PLAN: SLP FREQUENCY: 1-2x/week  SLP DURATION: 8 weeks  PLANNED INTERVENTIONS: Aspiration precaution training, Pharyngeal strengthening exercises, Diet toleration management , Trials of upgraded texture/liquids, SLP instruction and feedback, and Patient/family education    Georgana Curio, graduate student clinician, [employer] [facility]ell[facility] B. Dreama Saa, M.S., CCC-SLP, Tree surgeon Certified Brain Injury Specialist [facility]  [facility] Office [phone] Ascom [phone] Fax [phone]

## 2022-07-30 ENCOUNTER — Inpatient Hospital Stay: Payer: Medicare HMO | Attending: Oncology

## 2022-07-30 ENCOUNTER — Inpatient Hospital Stay: Payer: Medicare HMO

## 2022-07-30 ENCOUNTER — Inpatient Hospital Stay (HOSPITAL_BASED_OUTPATIENT_CLINIC_OR_DEPARTMENT_OTHER): Payer: Medicare HMO | Admitting: Oncology

## 2022-07-30 ENCOUNTER — Encounter: Payer: Self-pay | Admitting: Oncology

## 2022-07-30 VITALS — HR 89

## 2022-07-30 VITALS — BP 113/68 | HR 108 | Temp 98.0°F | Resp 18 | Ht 60.0 in | Wt 117.0 lb

## 2022-07-30 DIAGNOSIS — C3491 Malignant neoplasm of unspecified part of right bronchus or lung: Secondary | ICD-10-CM | POA: Insufficient documentation

## 2022-07-30 DIAGNOSIS — Z803 Family history of malignant neoplasm of breast: Secondary | ICD-10-CM | POA: Insufficient documentation

## 2022-07-30 DIAGNOSIS — Z86711 Personal history of pulmonary embolism: Secondary | ICD-10-CM | POA: Diagnosis not present

## 2022-07-30 DIAGNOSIS — Z923 Personal history of irradiation: Secondary | ICD-10-CM | POA: Insufficient documentation

## 2022-07-30 DIAGNOSIS — Z87891 Personal history of nicotine dependence: Secondary | ICD-10-CM | POA: Diagnosis not present

## 2022-07-30 DIAGNOSIS — Z5112 Encounter for antineoplastic immunotherapy: Secondary | ICD-10-CM

## 2022-07-30 DIAGNOSIS — Z5111 Encounter for antineoplastic chemotherapy: Secondary | ICD-10-CM | POA: Diagnosis present

## 2022-07-30 DIAGNOSIS — R0602 Shortness of breath: Secondary | ICD-10-CM | POA: Diagnosis not present

## 2022-07-30 DIAGNOSIS — Z7901 Long term (current) use of anticoagulants: Secondary | ICD-10-CM | POA: Insufficient documentation

## 2022-07-30 DIAGNOSIS — R053 Chronic cough: Secondary | ICD-10-CM | POA: Diagnosis not present

## 2022-07-30 DIAGNOSIS — G893 Neoplasm related pain (acute) (chronic): Secondary | ICD-10-CM | POA: Insufficient documentation

## 2022-07-30 DIAGNOSIS — I959 Hypotension, unspecified: Secondary | ICD-10-CM | POA: Insufficient documentation

## 2022-07-30 DIAGNOSIS — R112 Nausea with vomiting, unspecified: Secondary | ICD-10-CM | POA: Diagnosis not present

## 2022-07-30 DIAGNOSIS — R1310 Dysphagia, unspecified: Secondary | ICD-10-CM | POA: Diagnosis not present

## 2022-07-30 DIAGNOSIS — C7951 Secondary malignant neoplasm of bone: Secondary | ICD-10-CM | POA: Diagnosis not present

## 2022-07-30 DIAGNOSIS — D649 Anemia, unspecified: Secondary | ICD-10-CM

## 2022-07-30 LAB — CBC WITH DIFFERENTIAL/PLATELET
Abs Immature Granulocytes: 0.04 10*3/uL (ref 0.00–0.07)
Basophils Absolute: 0 10*3/uL (ref 0.0–0.1)
Basophils Relative: 0 %
Eosinophils Absolute: 0 10*3/uL (ref 0.0–0.5)
Eosinophils Relative: 0 %
HCT: 30 % — ABNORMAL LOW (ref 36.0–46.0)
Hemoglobin: 9.3 g/dL — ABNORMAL LOW (ref 12.0–15.0)
Immature Granulocytes: 1 %
Lymphocytes Relative: 20 %
Lymphs Abs: 1.2 10*3/uL (ref 0.7–4.0)
MCH: 30.6 pg (ref 26.0–34.0)
MCHC: 31 g/dL (ref 30.0–36.0)
MCV: 98.7 fL (ref 80.0–100.0)
Monocytes Absolute: 0.3 10*3/uL (ref 0.1–1.0)
Monocytes Relative: 5 %
Neutro Abs: 4.7 10*3/uL (ref 1.7–7.7)
Neutrophils Relative %: 74 %
Platelets: 304 10*3/uL (ref 150–400)
RBC: 3.04 MIL/uL — ABNORMAL LOW (ref 3.87–5.11)
RDW: 19.8 % — ABNORMAL HIGH (ref 11.5–15.5)
WBC: 6.3 10*3/uL (ref 4.0–10.5)
nRBC: 0 % (ref 0.0–0.2)

## 2022-07-30 LAB — COMPREHENSIVE METABOLIC PANEL
ALT: 17 U/L (ref 0–44)
AST: 28 U/L (ref 15–41)
Albumin: 3.5 g/dL (ref 3.5–5.0)
Alkaline Phosphatase: 73 U/L (ref 38–126)
Anion gap: 9 (ref 5–15)
BUN: 30 mg/dL — ABNORMAL HIGH (ref 8–23)
CO2: 25 mmol/L (ref 22–32)
Calcium: 8.6 mg/dL — ABNORMAL LOW (ref 8.9–10.3)
Chloride: 104 mmol/L (ref 98–111)
Creatinine, Ser: 1.06 mg/dL — ABNORMAL HIGH (ref 0.44–1.00)
GFR, Estimated: 55 mL/min — ABNORMAL LOW (ref >60)
Glucose, Bld: 216 mg/dL — ABNORMAL HIGH (ref 70–99)
Potassium: 4.2 mmol/L (ref 3.5–5.1)
Sodium: 138 mmol/L (ref 135–145)
Total Bilirubin: 0.3 mg/dL (ref 0.3–1.2)
Total Protein: 7.2 g/dL (ref 6.5–8.1)

## 2022-07-30 LAB — IRON AND TIBC
Iron: 66 ug/dL (ref 28–170)
Saturation Ratios: 16 % (ref 10.4–31.8)
TIBC: 419 ug/dL (ref 250–450)
UIBC: 353 ug/dL

## 2022-07-30 LAB — TSH: TSH: 0.569 u[IU]/mL (ref 0.350–4.500)

## 2022-07-30 LAB — VITAMIN B12: Vitamin B-12: 748 pg/mL (ref 180–914)

## 2022-07-30 LAB — FERRITIN: Ferritin: 289 ng/mL (ref 11–307)

## 2022-07-30 MED ORDER — SODIUM CHLORIDE 0.9 % IV SOLN
500.0000 mg/m2 | Freq: Once | INTRAVENOUS | Status: AC
  Administered 2022-07-30: 800 mg via INTRAVENOUS
  Filled 2022-07-30: qty 20

## 2022-07-30 MED ORDER — HEPARIN SOD (PORK) LOCK FLUSH 100 UNIT/ML IV SOLN
500.0000 [IU] | Freq: Once | INTRAVENOUS | Status: AC | PRN
  Administered 2022-07-30: 500 [IU]
  Filled 2022-07-30: qty 5

## 2022-07-30 MED ORDER — SODIUM CHLORIDE 0.9 % IV SOLN
Freq: Once | INTRAVENOUS | Status: AC
  Filled 2022-07-30: qty 250

## 2022-07-30 MED ORDER — CYANOCOBALAMIN 1000 MCG/ML IJ SOLN
1000.0000 ug | Freq: Once | INTRAMUSCULAR | Status: AC
  Administered 2022-07-30: 1000 ug via INTRAMUSCULAR
  Filled 2022-07-30: qty 1

## 2022-07-30 MED ORDER — SODIUM CHLORIDE 0.9 % IV SOLN
200.0000 mg | Freq: Once | INTRAVENOUS | Status: AC
  Administered 2022-07-30: 200 mg via INTRAVENOUS
  Filled 2022-07-30: qty 8

## 2022-07-30 MED ORDER — SODIUM CHLORIDE 0.9 % IV SOLN
10.0000 mg | Freq: Once | INTRAVENOUS | Status: AC
  Administered 2022-07-30: 10 mg via INTRAVENOUS
  Filled 2022-07-30: qty 10

## 2022-07-30 MED ORDER — PALONOSETRON HCL INJECTION 0.25 MG/5ML
0.2500 mg | Freq: Once | INTRAVENOUS | Status: AC
  Administered 2022-07-30: 0.25 mg via INTRAVENOUS
  Filled 2022-07-30: qty 5

## 2022-07-30 NOTE — Patient Instructions (Signed)
[place] [facility]  Discharge Instructions: Thank you for choosing [facility] to provide your oncology and hematology care.  If you have a lab appointment with the Cancer Center, please go directly to the Cancer Center and check in at the registration area.  Wear comfortable clothing and clothing appropriate for easy access to any Portacath or PICC line.   We strive to give you quality time with your provider. You may need to reschedule your appointment if you arrive late (15 or more minutes).  Arriving late affects you and other patients whose appointments are after yours.  Also, if you miss three or more appointments without notifying the office, you may be dismissed from the clinic at the provider's discretion.      For prescription refill requests, have your pharmacy contact our office and allow 72 hours for refills to be completed.    Today you received the following chemotherapy and/or immunotherapy agents Alimta, Keytruda and B12.      To help prevent nausea and vomiting after your treatment, we encourage you to take your nausea medication as directed.  BELOW ARE SYMPTOMS THAT SHOULD BE REPORTED IMMEDIATELY: *FEVER GREATER THAN 100.4 F (38 C) OR HIGHER *CHILLS OR SWEATING *NAUSEA AND VOMITING THAT IS NOT CONTROLLED WITH YOUR NAUSEA MEDICATION *UNUSUAL SHORTNESS OF BREATH *UNUSUAL BRUISING OR BLEEDING *URINARY PROBLEMS (pain or burning when urinating, or frequent urination) *BOWEL PROBLEMS (unusual diarrhea, constipation, pain near the anus) TENDERNESS IN MOUTH AND THROAT WITH OR WITHOUT PRESENCE OF ULCERS (sore throat, sores in mouth, or a toothache) UNUSUAL RASH, SWELLING OR PAIN  UNUSUAL VAGINAL DISCHARGE OR ITCHING   Items with * indicate a potential emergency and should be followed up as soon as possible or go to the Emergency Department if any problems should occur.  Please show the CHEMOTHERAPY ALERT CARD or IMMUNOTHERAPY ALERT CARD  at check-in to the Emergency Department and triage nurse.  Should you have questions after your visit or need to cancel or reschedule your appointment, please contact [place] [facility]  [phone] and follow the prompts.  Office hours are 8:00 a.m. to 4:30 p.m. Monday - Friday. Please note that voicemails left after 4:00 p.m. may not be returned until the following business day.  We are closed weekends and major holidays. You have access to a nurse at all times for urgent questions. Please call the main number to the clinic [phone] and follow the prompts.  For any non-urgent questions, you may also contact your provider using MyChart. We now offer e-Visits for anyone 9 and older to request care online for non-urgent symptoms. For details visit [email or web address].   Also download the MyChart app! Go to the app store, search "MyChart", open the app, select [place], and log in with your MyChart username and password.

## 2022-07-30 NOTE — Progress Notes (Signed)
No concerns for the provider. 

## 2022-07-30 NOTE — Progress Notes (Signed)
Nutrition Follow-up:  Patient with lung cancer with bone metastases.  Patient receiving carboplatin, keytruda, alimta.    Met with patient and sister during infusion.  Patient with hoarse voice.  Following with SLP (mechanical soft, nectar thick liquids recommended).  Appetite is down.  "I am doing the best I can."  Drinking boost VHC.  Having issues with nausea, new medication prescribed.      Medications: olanzapine  Labs: glucose 216, BUN 30, creatinine 1.06, calcium 8.6  Anthropometrics:   Weight 117 lb today  116 lb 8 oz on 3/8 118 lb 8 oz on 2/16 111 lb 12.8 oz on 12/14 130 lb on 02/10/22  NUTRITION DIAGNOSIS: Inadequate oral intake ongoing    INTERVENTION:  Called kitchen for thickener/nectar thick liquids to be sent to cancer center during infusion. Continue boost VHC for increased calories and protein Continue high calorie, high protein foods to maintain weight    MONITORING, EVALUATION, GOAL: weight trends, intake   NEXT VISIT: Friday, May 31 during infusion  Richardo Popoff B. Freida Busman, RD, LDN Registered Dietitian [phone]

## 2022-07-30 NOTE — Progress Notes (Signed)
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  She smoked about 2 packs of cigarettes per day but quit smoking in 2010.  She presented to the ER withSymptoms of significant back pain MRIs lumbar spine without contrast showed pathologic fracture of the L1 vertebral body with posterior bulging of the cortex resulting in mild spinal canal stenosis.  Diffuse T1 hypointense abnormality in the L1 vertebral body with involvement of bilateral pedicles and transverse processes.  CT chest abdomen and pelvis with contrast showed 1 cm left supraclavicular adenopathy.  1 cm right subcarinal lymph node.  Necrotic 3.1 x 2.4 x 2.9 cm anterior mediastinal mass in the left prevascular region with mild mass effect on the brachiocephalic vein.  Possible phrenic nerve involvement.  Mild acute sigmoid colon diverticulitis.    Patient had biopsy of the left supraclavicular lymph node which was consistent with adenocarcinoma of lung primary.  NGS testing did not show any actionable mutations.  K-ras G12 D mutation detected.  MSI stable.  Patient underwent percutaneous fixation of the L1 vertebral body.  She completed palliative radiation treatment to her spine    Interval history-patient has partial vocal cord paralysis for which she is seeing speech pathology.  Tolerating treatments well so far.  She has baseline fatigue and occasional nausea  ECOG PS- 1 Pain scale- 0   Review of  systems- Review of Systems  Constitutional:  Positive for malaise/fatigue. Negative for chills, fever and weight loss.  HENT:  Negative for congestion, ear discharge and nosebleeds.   Eyes:  Negative for blurred vision.  Respiratory:  Negative for cough, hemoptysis, sputum production, shortness of breath and wheezing.   Cardiovascular:  Negative for chest pain, palpitations, orthopnea and claudication.  Gastrointestinal:  Positive for nausea. Negative for abdominal pain, blood in stool, constipation, diarrhea, heartburn, melena and vomiting.  Genitourinary:  Negative for dysuria, flank pain, frequency, hematuria and urgency.  Musculoskeletal:  Negative for back pain, joint pain and myalgias.  Skin:  Negative for rash.  Neurological:  Negative for dizziness, tingling, focal weakness, seizures, weakness and headaches.  Endo/Heme/Allergies:  Does not bruise/bleed easily.  Psychiatric/Behavioral:  Negative for depression and suicidal ideas. The patient does not have insomnia.       No Known Allergies   Past Medical History:  Diagnosis Date   Arthritis    hands and feet   Cancer associated pain    Cataract    right   Complication of anesthesia    sometimes have a hard time waking up   Diabetes mellitus    Borderline Diabetes   GERD (gastroesophageal reflux disease)    HTN (hypertension)    Hyperlipidemia    Motion sickness    Post corneal transplant    right     Past Surgical History:  Procedure Laterality Date   APPLICATION OF INTRAOPERATIVE CT SCAN  02/15/2022   Procedure: APPLICATION OF INTRAOPERATIVE CT SCAN;  Surgeon: Venetia Night, MD;  Location: [facility] ORS;  Service: Neurosurgery;;   BASAL  CELL CARCINOMA EXCISION     Dr. Lorn Junes   CARPAL TUNNEL RELEASE     right   COLONOSCOPY WITH PROPOFOL N/A 12/03/2015   Procedure: COLONOSCOPY WITH PROPOFOL;  Surgeon: Scot Jun, MD;  Location[facility] ENDOSCOPY;  Service: Endoscopy;  Laterality: N/A;   COLONOSCOPY WITH  PROPOFOL N/A 02/22/2020   Procedure: COLONOSCOPY WITH PROPOFOL;  Surgeon: Regis Bill, MD;  Location: [facility] ENDOSCOPY;  Service: Endoscopy;  Laterality: N/A;   ESOPHAGOGASTRODUODENOSCOPY (EGD) WITH PROPOFOL N/A 02/22/2020   Procedure: ESOPHAGOGASTRODUODENOSCOPY (EGD) WITH PROPOFOL;  Surgeon: Regis Bill, MD;  Location: [facility] ENDOSCOPY;  Service: Endoscopy;  Laterality: N/A;   ESOPHAGOGASTRODUODENOSCOPY (EGD) WITH PROPOFOL N/A 06/07/2022   Procedure: ESOPHAGOGASTRODUODENOSCOPY (EGD) WITH PROPOFOL;  Surgeon: Regis Bill, MD;  Location: [facility] ENDOSCOPY;  Service: Endoscopy;  Laterality: N/A;   HAMMER TOE SURGERY Right 05/23/2019   Procedure: HAMMER TOE CORRECTION;  Surgeon: Gwyneth Revels, DPM;  Location: [facility];  Service: Podiatry;  Laterality: Right;  Diabetic   IR BONE TUMOR(S)RF ABLATION  04/30/2022   IR IMAGING GUIDED PORT INSERTION  04/14/2022   IR KYPHO LUMBAR INC FX REDUCE BONE BX UNI/BIL CANNULATION INC/IMAGING  04/30/2022   IR RADIOLOGIST EVAL & MGMT  03/24/2022   IR RADIOLOGIST EVAL & MGMT  06/23/2022   OSTECTOMY Right 05/23/2019   Procedure: DOUBLE OSTEOTOMY RIGHT;  Surgeon: Gwyneth Revels, DPM;  Location: [facility];  Service: Podiatry;  Laterality: Right;   SKIN CANCER EXCISION      Social History   Socioeconomic History   Marital status: Widowed    Spouse name: Not on file   Number of children: Not on file   Years of education: Not on file   Highest education level: Not on file  Occupational History    Employer: [employer]    Comment: Currently not employed  Tobacco Use   Smoking status: Former    Types: Cigarettes    Quit date: 01/13/2009    Years since quitting: 13.5   Smokeless tobacco: Never  Vaping Use   Vaping Use: Never used  Substance and Sexual Activity   Alcohol use: No   Drug use: No   Sexual activity: Not Currently    Birth control/protection: Post-menopausal  Other Topics Concern   Not on file  Social History  Narrative   Not on file   Social Determinants of Health   Financial Resource Strain: Not on file  Food Insecurity: No Food Insecurity (03/31/2022)   Hunger Vital Sign    Worried About Running Out of Food in the Last Year: Never true    Ran Out of Food in the Last Year: Never true  Transportation Needs: No Transportation Needs (03/31/2022)   PRAPARE - Administrator, Civil Service (Medical): No    Lack of Transportation (Non-Medical): No  Physical Activity: Not on file  Stress: Not on file  Social Connections: Not on file  Intimate Partner Violence: Not At Risk (02/10/2022)   Humiliation, Afraid, Rape, and Kick questionnaire    Fear of Current or Ex-Partner: No    Emotionally Abused: No    Physically Abused: No    Sexually Abused: No    Family History  Problem Relation Age of Onset   Heart disease Mother    Heart disease Father    Heart disease Maternal Grandmother    Heart disease Maternal Grandfather    Breast cancer Sister 64     Current Outpatient Medications:    acetaminophen (TYLENOL)  500 MG tablet, Take 500 mg by mouth every 6 (six) hours as needed for moderate pain, mild pain, fever or headache., Disp: , Rfl:    amoxicillin-clavulanate (AUGMENTIN) 875-125 MG tablet, Take 1 tablet by mouth 2 (two) times daily., Disp: 14 tablet, Rfl: 0   Ascorbic Acid (VITAMIN C) 1000 MG tablet, Take 1,000 mg by mouth daily.  , Disp: , Rfl:    carvedilol (COREG) 3.125 MG tablet, Take 3.125 mg by mouth 2 (two) times daily with a meal., Disp: , Rfl:    cyclobenzaprine (FLEXERIL) 5 MG tablet, Take 1 tablet (5 mg total) by mouth 3 (three) times daily as needed for muscle spasms., Disp: 30 tablet, Rfl: 0   dexamethasone (DECADRON) 4 MG tablet, TAKE 1 TABLET 2 TIMES DAILY STARTING DAY BEFORE PEMETREXED. THEN TAKE 2 TABS DAILY FOR 3 DAYS STARTING DAY AFTER CARBOPLATIN. TAKE WITH FOOD., Disp: 30 tablet, Rfl: 1   Docusate Calcium (STOOL SOFTENER PO), Take 1 capsule by mouth daily as  needed (constipation)., Disp: , Rfl:    ELIQUIS 5 MG TABS tablet, TAKE 1 TABLET BY MOUTH TWICE A DAY, Disp: 60 tablet, Rfl: 2   folic acid (FOLVITE) 1 MG tablet, Take 1 tablet (1 mg total) by mouth daily. Start 7 days before pemetrexed chemotherapy. Continue until 21 days after pemetrexed completed., Disp: 100 tablet, Rfl: 3   gabapentin (NEURONTIN) 300 MG capsule, Take 1 capsule (300 mg total) by mouth 3 (three) times daily., Disp: 90 capsule, Rfl: 2   lidocaine-prilocaine (EMLA) cream, Apply 1 Application topically as needed., Disp: 30 g, Rfl: 2   LORazepam (ATIVAN) 0.5 MG tablet, Take 1 tablet (0.5 mg total) by mouth every 8 (eight) hours as needed for anxiety., Disp: 30 tablet, Rfl: 0   losartan (COZAAR) 100 MG tablet, Take 100 mg by mouth daily., Disp: , Rfl:    metFORMIN (GLUCOPHAGE) 500 MG tablet, Take by mouth 2 (two) times daily with a meal., Disp: , Rfl:    metoprolol tartrate (LOPRESSOR) 25 MG tablet, Take 1 tablet (25 mg total) by mouth 2 (two) times daily., Disp: 60 tablet, Rfl: 0   Multiple Vitamin (MULTIVITAMIN) capsule, Take 1 capsule by mouth daily.  , Disp: , Rfl:    OLANZapine (ZYPREXA) 10 MG tablet, TAKE 1 TABLET BY MOUTH EVERYDAY AT BEDTIME, Disp: 90 tablet, Rfl: 1   omeprazole (PRILOSEC) 20 MG capsule, Take 1 capsule (20 mg total) by mouth daily., Disp: 90 capsule, Rfl: 3   ondansetron (ZOFRAN) 8 MG tablet, Take 1 tablet (8 mg total) by mouth every 8 (eight) hours as needed for nausea or vomiting. Start on the third day after carboplatin., Disp: 30 tablet, Rfl: 1   oxyCODONE ER (XTAMPZA ER) 18 MG C12A, Take 1 capsule by mouth every 12 (twelve) hours., Disp: 60 capsule, Rfl: 0   Oxycodone HCl 20 MG TABS, Take 0.5-1 tablets (10-20 mg total) by mouth every 4 (four) hours as needed., Disp: 60 tablet, Rfl: 0   polyethylene glycol (MIRALAX / GLYCOLAX) 17 g packet, Take 17 g by mouth daily., Disp: 14 each, Rfl: 0   prednisoLONE acetate (PRED MILD) 0.12 % ophthalmic suspension, Place 1  drop into both eyes daily., Disp: , Rfl:    prochlorperazine (COMPAZINE) 10 MG tablet, Take 1 tablet (10 mg total) by mouth every 6 (six) hours as needed for nausea or vomiting., Disp: 30 tablet, Rfl: 2   simvastatin (ZOCOR) 40 MG tablet, TAKE 1 TABLET EVERY DAY  AT  6:00 PM, Disp:  90 tablet, Rfl: 3  Physical exam:  Vitals:   07/30/22 0831  BP: 113/68  Pulse: (!) 108  Resp: 18  Temp: 98 F (36.7 C)  TempSrc: Tympanic  SpO2: 99%  Weight: 117 lb (53.1 kg)  Height: 5' (1.524 m)   Physical Exam Cardiovascular:     Rate and Rhythm: Normal rate and regular rhythm.     Heart sounds: Normal heart sounds.  Pulmonary:     Effort: Pulmonary effort is normal.     Breath sounds: Normal breath sounds.  Skin:    General: Skin is warm and dry.  Neurological:     Mental Status: She is alert and oriented to person, place, and time.         Latest Ref Rng & Units 07/30/2022    8:18 AM  CMP  Glucose 70 - 99 mg/dL 161   BUN 8 - 23 mg/dL 30   Creatinine 0.96 - 1.00 mg/dL 0.45   Sodium 409 - 811 mmol/L 138   Potassium 3.5 - 5.1 mmol/L 4.2   Chloride 98 - 111 mmol/L 104   CO2 22 - 32 mmol/L 25   Calcium 8.9 - 10.3 mg/dL 8.6   Total Protein 6.5 - 8.1 g/dL 7.2   Total Bilirubin 0.3 - 1.2 mg/dL 0.3   Alkaline Phos 38 - 126 U/L 73   AST 15 - 41 U/L 28   ALT 0 - 44 U/L 17       Latest Ref Rng & Units 07/30/2022    8:18 AM  CBC  WBC 4.0 - 10.5 K/uL 6.3   Hemoglobin 12.0 - 15.0 g/dL 9.3   Hematocrit 91.4 - 46.0 % 30.0   Platelets 150 - 400 K/uL 304     No images are attached to the encounter.  DG SWALLOW FUNC OP MEDICARE SPEECH PATH  Result Date: 07/05/2022 Table formatting from the original result was not included. Modified Barium Swallow Study Patient Details Name: NANDI TONNESEN MRN: [id number] Date of Birth: 05-May-1950 Today's Date: 07/05/2022 HPI/PMH: HPI: Patient is a 73 y.o. female with past medical history including diabetes, HTN, HLD, who was hospitalized 02/10/22-02/18/22 with  back pain and found to have acute compression fracture of L1, s/p percutaneous fixation. CT chest, abdomen and pelvis revealed centrally necrotic anterior mediastinal mass with phrenic nerve impingement and diaphragmatic dysfunction. Per GI notes, pt noted dysphagia and hoarseness s/p mediastinal biopsy. She was also found to have adenocarcinoma of the lung. Barium Swallow on 03/24/22: "Mild relative narrowing of the distal esophagus just proximal to  the gastroesophageal junction concerning for a mild stricture  restricting the passage of a 13 mm barium tablet.  Mild tertiary contractions of the esophagus as can be seen with mild spasm." EGD on 06/07/22: "No endoscopic abnormality was evident in the esophagus to explain the patient's complaint of dysphagia." Patient reports she is currently taking antibiotics for pneumonia, and that she gets pneumonia frequently. She was noted to have a cough and wet vocal quality after sipping a cup of water prior to the exam. Clinical Impression: Clinical Impression: Patient presents with multifactorial oropharyngeal and pharyngoesophageal dysphagia. Motor and sensory impairments are noted, with resulting silent aspiration of thin liquids. Oral stage is characterized by slow, disorganized mastication and lingual transport of solid. Swallow initiation occurs at the posterior laryngeal surface of the epiglottis with liquids. Anterior hyoid excursion, laryngeal closure, and epiglottic deflection are incomplete, which results in frank penetration and aspiration of thin liquid during the swallow which  was unsensed. Cued cough was not effective to fully eject aspiration, and was subjectively weak. Strategies including 3 second bolus hold, as well as left and right head turn did not prevent penetration/aspiration. Pharyngeal stripping wave was diminished, and in anterior-posterior view unilateral bulging occurred (left), suggesting unilateral weakness. Appearance of slow clearance  through the cervical esophagus, as well as retrograde flow of contrast with most consistencies below the level of the pharyngoesophageal segment. Given hoarse vocal quality, recommend ENT consult for further assessment to determine whether vocal cord weakness or paralysis may be contributing to new onset dysphagia. Recommend pt thicken liquids to nectar consistency, and consume single sips using slow rate. She is at increased risk for an adverse event given frequent (and current) pneumonia, likely consistent aspiration of large volumes, as well as other comorbidities. Recommendations provided in handout form and via teachback to pt, also discussed with her daughter by phone. Should ENT identify focal deficits, pt may benefit from course of OP ST for swallowing and vocal function, including potentially respiratory muscle strength training. Factors that may increase risk of adverse event in presence of aspiration [name] & [name] 2021): Factors that may increase risk of adverse event in presence of aspiration [name] & [name] 2021): Respiratory or GI disease; Poor general health and/or compromised immunity; Frequent aspiration of large volumes Recommendations/Plan: Swallowing Evaluation Recommendations Swallowing Evaluation Recommendations Recommendations: PO diet PO Diet Recommendation: Regular; Dysphagia 3 (Mechanical soft); Mildly thick liquids (Level 2, nectar thick) Liquid Administration via: Cup Medication Administration: Whole meds with puree Supervision: Patient able to self-feed; Intermittent supervision/cueing for swallowing strategies Swallowing strategies  : Slow rate; Small bites/sips Postural changes: Position pt fully upright for meals; Stay upright 30-60 min after meals Oral care recommendations: Oral care QID (4x/day) Recommended consults: Consider ENT consultation Caregiver Recommendations: -- (avoid mixed consistencies) Treatment Plan Treatment Plan Treatment recommendations: Other (comment)  Follow-up recommendations: Outpatient SLP Recommendations Comment: Recommend ENT consult, then OP SLP should voice/swallowing therapy be indicated per physical exam and if this is in line with pt's goals of care Functional status assessment: Patient has had a recent decline in their functional status and/or demonstrates limited ability to make significant improvements in function in a reasonable and predictable amount of time. Recommendations Recommendations for follow up therapy are one component of a multi-disciplinary discharge planning process, led by the attending physician.  Recommendations may be updated based on patient status, additional functional criteria and insurance authorization. Assessment: Orofacial Exam: Orofacial Exam Oral Cavity: Oral Hygiene: Xerostomia Oral Cavity - Dentition: Adequate natural dentition Orofacial Anatomy: WFL Oral Motor/Sensory Function: Suspected cranial nerve impairment CN V - Trigeminal: WFL CN VII - Facial: WFL CN IX - Glossopharyngeal, CN X - Vagus: -- (hoarse) CN XII - Hypoglossal: WFL Anatomy: Anatomy: WFL Boluses Administered: No data recorded Oral Impairment Domain: Oral Impairment Domain Bolus preparation/mastication: Slow prolonged chewing/mashing with complete recollection Initiation of pharyngeal swallow : Posterior laryngeal surface of the epiglottis  Pharyngeal Impairment Domain: Pharyngeal Impairment Domain Soft palate elevation: No bolus between soft palate (SP)/pharyngeal wall (PW) Laryngeal elevation: Partial superior movement of thyroid cartilage/partial approximation of arytenoids to epiglottic petiole Anterior hyoid excursion: Partial anterior movement Epiglottic movement: Partial inversion Laryngeal vestibule closure: Incomplete, narrow column air/contrast in laryngeal vestibule Pharyngeal stripping wave : Present - diminished Pharyngeal contraction (A/P view only): Unilateral bulging Pharyngoesophageal segment opening: Partial distention/partial  duration, partial obstruction of flow Tongue base retraction: Trace column of contrast or air between tongue base and PPW Pharyngeal residue: Trace residue within or  on pharyngeal structures Location of pharyngeal residue: Tongue base; Valleculae  Esophageal Impairment Domain: Esophageal Impairment Domain Esophageal clearance upright position: Esophageal retention with retrograde flow below pharyngoesophageal segment (PES) Pill: Esophageal Impairment Domain Esophageal clearance upright position: Esophageal retention with retrograde flow below pharyngoesophageal segment (PES) Penetration/Aspiration Scale Score: No data recorded Compensatory Strategies: Compensatory Strategies Compensatory strategies: Yes Left head turn: Ineffective Ineffective Left Head Turn: Thin liquid (Level 0) Right head turn: Ineffective Ineffective Right Head Turn: Thin liquid (Level 0) Oral bolus hold: Ineffective   General Information: Caregiver present: No  Diet Prior to this Study: Regular; Thin liquids (Level 0)   Temperature : -- (unknown; pt reports she currently is taking antibiotics for PNA)   Respiratory Status: Other (comment) (appears WFL, reports she has some breathing difficulties)   Supplemental O2: None (Room air)   History of Recent Intubation: No  Behavior/Cognition: Alert; Cooperative Self-Feeding Abilities: Able to self-feed Baseline vocal quality/speech: Hypophonia/low volume Volitional Cough: Able to elicit Volitional Swallow: Able to elicit Exam Limitations: No limitations Goal Planning: Prognosis for improved oropharyngeal function: Fair Barriers to Reach Goals: Other (Comment) (comorbidities) No data recorded No data recorded Consulted and agree with results and recommendations: Patient; Other (comment) (daughter, via phone) Pain: Pain Assessment Pain Assessment: No/denies pain End of Session: Start Time:SLP Start Time (ACUTE ONLY): 1400 Stop Time: SLP Stop Time (ACUTE ONLY): 1435 Time Calculation:SLP Time Calculation  (min) (ACUTE ONLY): 35 min Charges: SLP Evaluations $ SLP Speech Visit: 1 Visit SLP Evaluations $Outpatient MBS Swallow: 1 Procedure SLP visit diagnosis: SLP Visit Diagnosis: Dysphagia, oropharyngeal phase (R13.12); Dysphagia, pharyngoesophageal phase (R13.14) Past Medical History: Past Medical History: Diagnosis Date  Arthritis   hands and feet  Cancer associated pain   Cataract   right  Complication of anesthesia   sometimes have a hard time waking up  Diabetes mellitus   Borderline Diabetes  GERD (gastroesophageal reflux disease)   HTN (hypertension)   Hyperlipidemia   Motion sickness   Post corneal transplant   right Past Surgical History: Past Surgical History: Procedure Laterality Date  APPLICATION OF INTRAOPERATIVE CT SCAN  02/15/2022  Procedure: APPLICATION OF INTRAOPERATIVE CT SCAN;  Surgeon: Venetia Night, MD;  Location: [facility] ORS;  Service: Neurosurgery;;  BASAL CELL CARCINOMA EXCISION    Dr. Lorn Junes  CARPAL TUNNEL RELEASE    right  COLONOSCOPY WITH PROPOFOL N/A 12/03/2015  Procedure: COLONOSCOPY WITH PROPOFOL;  Surgeon: Scot Jun, MD;  Location: [facility] ENDOSCOPY;  Service: Endoscopy;  Laterality: N/A;  COLONOSCOPY WITH PROPOFOL N/A 02/22/2020  Procedure: COLONOSCOPY WITH PROPOFOL;  Surgeon: Regis Bill, MD;  Location: [facility] ENDOSCOPY;  Service: Endoscopy;  Laterality: N/A;  ESOPHAGOGASTRODUODENOSCOPY (EGD) WITH PROPOFOL N/A 02/22/2020  Procedure: ESOPHAGOGASTRODUODENOSCOPY (EGD) WITH PROPOFOL;  Surgeon: Regis Bill, MD;  Location: [facility] ENDOSCOPY;  Service: Endoscopy;  Laterality: N/A;  ESOPHAGOGASTRODUODENOSCOPY (EGD) WITH PROPOFOL N/A 06/07/2022  Procedure: ESOPHAGOGASTRODUODENOSCOPY (EGD) WITH PROPOFOL;  Surgeon: Regis Bill, MD;  Location: [facility] ENDOSCOPY;  Service: Endoscopy;  Laterality: N/A;  HAMMER TOE SURGERY Right 05/23/2019  Procedure: HAMMER TOE CORRECTION;  Surgeon: Gwyneth Revels, DPM;  Location: [facility];  Service: Podiatry;  Laterality: Right;   Diabetic  IR BONE TUMOR(S)RF ABLATION  04/30/2022  IR IMAGING GUIDED PORT INSERTION  04/14/2022  IR KYPHO LUMBAR INC FX REDUCE BONE BX UNI/BIL CANNULATION INC/IMAGING  04/30/2022  IR RADIOLOGIST EVAL & MGMT  03/24/2022  IR RADIOLOGIST EVAL & MGMT  06/23/2022  OSTECTOMY Right 05/23/2019  Procedure: DOUBLE OSTEOTOMY RIGHT;  Surgeon: Gwyneth Revels, DPM;  Location: [facility];  Service: Podiatry;  Laterality: Right;  SKIN CANCER EXCISION   Arlana Lindau 07/05/2022, 4:03 PM Rondel Baton, MS, CCC-SLP Speech-Language Pathologist [phone] CLINICAL DATA:  Or pharyngeal dysphagia. EXAM: MODIFIED BARIUM SWALLOW TECHNIQUE: Different consistencies of barium were administered orally to the patient by the Speech Pathologist. Imaging of the pharynx was performed in the lateral projection. The radiologist was present in the fluoroscopy room for this study, providing personal supervision. FLUOROSCOPY: Radiation Exposure Index (as provided by the fluoroscopic device): 23.9 mGy COMPARISON:  None Available. FINDINGS: Vestibular Penetration: Flash penetration with nectar thick consistency. Aspiration: Aspiration with thin consistency which persists with head turning and without a cough reflex. Otherwise no aspiration. Other:  None. IMPRESSION: Modified barium swallow as described above. Please refer to the Speech Pathologists report for complete details and recommendations. Electronically Signed   By: Elige Ko M.D.   On: 07/05/2022 13:46  CT CHEST ABDOMEN PELVIS W CONTRAST  Result Date: 07/05/2022 CLINICAL DATA:  Right-sided lung cancer. Restaging. * Tracking Code: BO * EXAM: CT CHEST, ABDOMEN, AND PELVIS WITH CONTRAST TECHNIQUE: Multidetector CT imaging of the chest, abdomen and pelvis was performed following the standard protocol during bolus administration of intravenous contrast. RADIATION DOSE REDUCTION: This exam was performed according to the departmental dose-optimization program which includes automated  exposure control, adjustment of the mA and/or kV according to patient size and/or use of iterative reconstruction technique. CONTRAST:  OMNIPAQUE IOHEXOL 300 MG/ML  SOLN COMPARISON:  Chest CTA 03/31/2022. Chest abdomen pelvis CT 02/10/2022. FINDINGS: CT CHEST FINDINGS Cardiovascular: The heart size is normal. No substantial pericardial effusion. Coronary artery calcification is evident. Mild atherosclerotic calcification is noted in the wall of the thoracic aorta. Right-sided Port-A-Cath tip is positioned in the right atrium. Mediastinum/Nodes: Anterior mediastinal mass seen 03/31/2022 has decreased in the interval measuring 1.6 x 1.4 cm today compared to 3.2 x 3.2 cm previously. 14 mm short axis subcarinal node seen on that study has decreased to 8 mm today (image 30/2). There is no hilar lymphadenopathy. The esophagus has normal imaging features. There is no axillary lymphadenopathy. Lungs/Pleura: The patchy and nodular ground-glass opacity seen scattered through both lungs previously has decreased substantially in the interval and resolved in some regions. Clustered nodularity in the posterior right upper lobe noted with 5 mm nodule seen on image 34/4. Many of the numerous tiny bilateral pulmonary nodules seen previously appear to have resolved in the interval. Consolidative disease seen in the left lower lobe previously has improved with persistent 3.4 x 1.5 cm consolidative lesion on 71/4. Peripheral tree-in-bud nodularity is seen in the inferior right middle lobe and right lower lobe (see image 97/4). No pleural effusion Musculoskeletal: Sclerosis in the posterior right ninth rib (94/4) is in area of lucency on the previous study suggesting sclerosis from interval healing. CT ABDOMEN PELVIS FINDINGS Hepatobiliary: Stable small cyst posterior right liver. No followup imaging is recommended. There is no evidence for gallstones, gallbladder wall thickening, or pericholecystic fluid. No intrahepatic or  extrahepatic biliary dilation. Pancreas: No focal mass lesion. No dilatation of the main duct. No intraparenchymal cyst. No peripancreatic edema. Spleen: No splenomegaly. No focal mass lesion. Adrenals/Urinary Tract: No adrenal nodule or mass. Kidneys unremarkable. No evidence for hydroureter. The urinary bladder appears normal for the degree of distention. Stomach/Bowel: Stomach is unremarkable. No gastric wall thickening. No evidence of outlet obstruction. Duodenum is normally positioned as is the ligament of Treitz. No small bowel wall thickening. No small bowel dilatation.  No gross colonic mass. No colonic wall thickening. Diverticular changes are noted in the left colon without evidence of diverticulitis. Vascular/Lymphatic: There is moderate atherosclerotic calcification of the abdominal aorta without aneurysm. There is no gastrohepatic or hepatoduodenal ligament lymphadenopathy. No retroperitoneal or mesenteric lymphadenopathy. No pelvic sidewall lymphadenopathy. Reproductive: Unremarkable. Other: No intraperitoneal free fluid. Musculoskeletal: No worrisome lytic or sclerotic osseous abnormality. Thoracolumbar fusion hardware again noted. IMPRESSION: 1. Interval decrease in size of the anterior mediastinal mass and subcarinal lymph node. 2. Marked interval improvement in the patchy and nodular ground-glass opacity seen scattered through both lungs previously. These changes are resolved in some regions. 3. Clustered nodularity in the posterior right upper lobe with 5 mm nodule seen. Tree-in-bud nodularity noted right middle and right lower lobes peripherally. 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 4. Many of the numerous tiny bilateral pulmonary nodules seen previously appear to have resolved in the interval. Consolidative disease seen in the left lower lobe previously has improved with persistent 3.4 x 1.5 cm consolidative area on today's study. 5. Sclerosis in the posterior right ninth  rib is in area of loop is in an area of lucency on the previous study suggesting healing metastatic lesion. 6. No evidence for metastatic disease in the abdomen or pelvis. 7. Left colonic diverticulosis without diverticulitis. 8.  Aortic Atherosclerosis (ICD10-I70.0). Electronically Signed   By: Kennith Center M.D.   On: 07/05/2022 10:01   NM Bone Scan Whole Body  Result Date: 07/01/2022 CLINICAL DATA:  Stage IV RIGHT lung cancer, BILATERAL hip pain, no recent injuries EXAM: NUCLEAR MEDICINE WHOLE BODY BONE SCAN TECHNIQUE: Whole body anterior and posterior images were obtained approximately 3 hours after intravenous injection of radiopharmaceutical. RADIOPHARMACEUTICALS:  23.14 mCi Technetium-23m MDP IV COMPARISON:  None available Radiographic correlation: CT chest 03/31/2022 FINDINGS: Uptake at shoulders, sternoclavicular joints, hips, typically degenerative. Abnormal tracer uptake at posterior RIGHT fifth and ninth ribs and posterolateral LEFT ninth rib, concerning for osseous metastases. Uptake at LEFT hemipelvis likely representing osseous metastasis. Uptake in upper lumbar spine at L1, may be related to orthopedic hardware, patient post spinal fixation. No other worrisome sites of tracer accumulation. Degenerative type uptake of tracer at shoulders, sternoclavicular joints, hips. Expected urinary tract and soft tissue distribution of tracer. IMPRESSION: Abnormal foci of uptake at three ribs and at LEFT hemipelvis consistent with osseous metastases. Electronically Signed   By: Ulyses Southward M.D.   On: 07/01/2022 16:41     Assessment and plan- Patient is a 72 y.o. female with metastatic lung cancer with bone metastases.  She is here for on treatment assessment prior to cycle 2 of maintenance Alimta and Keytruda  Counts okay to proceed with cycle 2 of maintenance Alimta and Keytruda today.  She will receive steroid and her premedications.  She will also receive her  B12 injection today.  I will see her back  in 3 weeks for cycle 3 of Alimta and Keytruda.  Scans after 4 cycles  Bone metastases: Patient does not wish to get any Zometa at this point.  She is on as needed oxycodone and Xtampza ER for her pain  History of pulmonary embolism: Continue Eliquis   Visit Diagnosis 1. Primary malignant neoplasm of right lung metastatic to other site   2. Encounter for antineoplastic chemotherapy and immunotherapy   3. Current use of long term anticoagulation   4. Neoplasm related pain      Dr. Owens Shark, MD, MPH [facility] [phone] 07/30/2022 8:52  AM

## 2022-07-31 LAB — T4: T4, Total: 7.5 ug/dL (ref 4.5–12.0)

## 2022-08-03 ENCOUNTER — Ambulatory Visit: Payer: Medicare HMO | Admitting: Speech Pathology

## 2022-08-03 DIAGNOSIS — R490 Dysphonia: Secondary | ICD-10-CM | POA: Diagnosis not present

## 2022-08-03 DIAGNOSIS — R1313 Dysphagia, pharyngeal phase: Secondary | ICD-10-CM

## 2022-08-03 NOTE — Therapy (Signed)
OUTPATIENT SPEECH LANGUAGE PATHOLOGY TREATMENT NOTE   Patient Name: Amber Glenn MRN: [id number] DOB:May 24, 1950, 72 y.o., female Today's Date: 08/03/2022  PCP: Joen Laura, MD  REFERRING PROVIDER: Linus Salmons, MD   END OF SESSION:   End of Session - 08/03/22 1429     Visit Number 4   Number of Visits 17    Date for SLP Re-Evaluation 09/15/22    Authorization Type [employer] H[employer]    Progress Note Due on Visit 10    SLP Start Time 1300    SLP Stop Time  1345    SLP Time Calculation (min) 45 min    Activity Tolerance Patient tolerated treatment well             Past Medical History:  Diagnosis Date   Arthritis    hands and feet   Cancer associated pain    Cataract    right   Complication of anesthesia    sometimes have a hard time waking up   Diabetes mellitus    Borderline Diabetes   GERD (gastroesophageal reflux disease)    HTN (hypertension)    Hyperlipidemia    Motion sickness    Post corneal transplant    right   Past Surgical History:  Procedure Laterality Date   APPLICATION OF INTRAOPERATIVE CT SCAN  02/15/2022   Procedure: APPLICATION OF INTRAOPERATIVE CT SCAN;  Surgeon: Venetia Night, MD;  Location: [facility] ORS;  Service: Neurosurgery;;   BASAL CELL CARCINOMA EXCISION     Dr. Lorn Junes   CARPAL TUNNEL RELEASE     right   COLONOSCOPY WITH PROPOFOL N/A 12/03/2015   Procedure: COLONOSCOPY WITH PROPOFOL;  Surgeon: Scot Jun, MD;  Location: [facility] END[facility]COPY;  Service: Endoscopy;  Laterality: N/A;   COLONOSCOPY WITH PROPOFOL N/A 02/22/2020   Procedure: COLONOSCOPY WITH PROPOFOL;  Surgeon: Regis Bill, MD;  Location: [facility] ENDOSCOPY;  Service: Endoscopy;  Laterality: N/A;   ESOPHAGOGASTRODUODENOSCOPY (EGD) WITH PROPOFOL N/A 02/22/2020   Procedure: ESOPHAGOGASTRODUODENOSCOPY (EGD) WITH PROPOFOL;  Surgeon: Regis Bill, MD;  Location: [facility] ENDOSCOPY;  Service: Endoscopy;  Laterality: N/A;   ESOPHAGOGASTRODUODENOSCOPY (EGD) WITH  PROPOFOL N/A 06/07/2022   Procedure: ESOPHAGOGASTRODUODENOSCOPY (EGD) WITH PROPOFOL;  Surgeon: Regis Bill, MD;  Location: [facility] ENDOSCOPY;  Service: Endoscopy;  Laterality: N/A;   HAMMER TOE SURGERY Right 05/23/2019   Procedure: HAMMER TOE CORRECTION;  Surgeon: Gwyneth Revels, DPM;  Location: [facility];  Service: Podiatry;  Laterality: Right;  Diabetic   IR BONE TUMOR(S)RF ABLATION  04/30/2022   IR IMAGING GUIDED PORT INSERTION  04/14/2022   IR KYPHO LUMBAR INC FX REDUCE BONE BX UNI/BIL CANNULATION INC/IMAGING  04/30/2022   IR RADIOLOGIST EVAL & MGMT  03/24/2022   IR RADIOLOGIST EVAL & MGMT  06/23/2022   OSTECTOMY Right 05/23/2019   Procedure: DOUBLE OSTEOTOMY RIGHT;  Surgeon: Gwyneth Revels, DPM;  Location: [facility];  Service: Podiatry;  Laterality: Right;   SKIN CANCER EXCISION     Patient Active Problem List   Diagnosis Date Noted   Iron deficiency anemia 04/16/2022   Acute on chronic respiratory failure with hypoxia 04/01/2022   Multifocal pneumonia 04/01/2022   Superficial thrombophlebitis 04/01/2022   Acute pulmonary embolism 03/31/2022   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 site 03/03/2022   Metastatic adenocarcinoma 02/18/2022   Acute low back pain 02/17/2022   Adenocarcinoma 02/17/2022   Delirium due to another medical condition 02/17/2022   Palliative care encounter 02/17/2022   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   Neoplasm related pain 02/11/2022   Supraclavicular adenopathy 02/11/2022   Mediastinal mass 02/11/2022   Goals of care, counseling/discussion 02/11/2022   Pathologic fracture of lumbar vertebra, initial encounter 02/11/2022   Pathologic fracture 02/10/2022   Pyuria 02/10/2022   Diarrhea    COVID-19 08/18/2019   Thumb pain 06/26/2015   Medicare annual wellness visit, subsequent 03/14/2015   Arthralgia 09/02/2014   Hot flashes 01/21/2014   Left shoulder pain 09/20/2013    Unspecified constipation 08/30/2012   Routine general medical examination at a health care facility 05/29/2012   Screening for colon cancer 05/29/2012   Hypertension 08/18/2011   Osteoarthritis 03/20/2009   Diabetes mellitus type 2, controlled 11/14/2008   BASAL CELL CARCINOMA, FACE 12/06/2006   Hyperlipidemia 12/06/2006   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  Pt sustained a compression fracture of L1 s/p fixation by neurosurgery (02/10/2022).  Further testing included CT Chest Abdomen Pelvis with Contrast (02/10/2022) that revealed 1. 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 There is also mild left supraclavicular adenopathy. 2. Scattered tree-in-bud reticulonodular opacities in the lungs, right greater than left, characteristic for atypical infectious bronchiolitis. One of the largest nodules is a 12 by 7 by 11 mm nodule in the right lateral costophrenic angle, which could be malignant or infectious. 3. Subtle abnormal hypoenhancement inferiorly in the pancreatic body measuring about 1.2 by 0.7 by 1.3 cm, along with some indistinct hypoenhancement in the pancreatic  head. This could be inflammatory but infiltrative pancreatic adenocarcinoma cannot be excluded. This warrants detailed workup with either pancreatic protocol CT or MRI with and without contrast. PET-CT could also be helpful in this case. 4. As shown on lumbar spine MRI, there is a pathologic (malignant) fracture at L1 with diffuse abnormal lysis involving the vertebral body, bilateral pedicles, and bilateral transverse processes at this level with about 65% of vertebral body height and with a posterior bulging contour of the vertebral body by about 5-6 mm. Further detail on dedicated lumbar spine CT. 5. Mild acute sigmoid colon diverticulitis. Tumor in the vicinity of the local inflammation along the sigmoid colon is not totally excluded but is a less likely differential diagnostic consideration. 6. Aortic atherosclerosis.  Coronary atherosclerosis. Pt underwent an ultrasound-guided core biopsy of "hypoechoic, abnormal appearing lymph node just lateral to the left internal jugular vein measuring approximately 1.2x1.0x1.2 cm." Results positive for lung adenocarcinoma. Pt underwent Palliative radiation to spine as well as combination of chemotherapy, immunotherapy and Keytruda.    Following biopsy of lymph node, pt began experiencing dysphagia and hoarseness. She had a Barium Swallow on 03/24/22: "Mild relative narrowing of the distal esophagus just proximal to  the gastroesophageal junction concerning for a mild stricture  restricting the passage of a 13 mm barium tablet.  Mild tertiary contractions  of the esophagus as can be seen with mild spasm." EGD on 06/07/22: "No endoscopic abnormality was evident in the esophagus to explain the patient's complaint of dysphagia." MBSS on 07/05/2022 : " multifactorial oropharyngeal and pharyngoesophageal dysphagia. Motor and sensory impairments are noted, with resulting silent aspiration of thin liquids. Anterior hyoid excursion, laryngeal closure, and epiglottic deflection are  incomplete, which results in frank penetration and aspiration of thin liquid during the swallow which was unsensed. Cued cough was not effective to fully eject aspiration, and was subjectively weak. Strategies including 3 second bolus hold, as well as left and right head turn did not prevent penetration/aspiration. Pharyngeal stripping wave was diminished, and in anterior-posterior view unilateral bulging occurred (left), suggesting unilateral weakness. 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 No  PATIENT GOALS:   "to drink thin liquids; being able to speak and talk plain again"   OBJECTIVE:   TODAY'S TREATMENT: Skilled treatment session focused on pt's dysphonia goals. SLP facilitated session by providing the following interventions:   EMST 75 set at 15 cmH2O - pt able to complete 3 sets of 10 reps  before fatigue with self-perceived effort of 6 out of 10  Phonation with vocal adduction Isolated vowels to simple phrases Maximal multimodal cues for deep breath in, hold, say vowel or phrase- no improved vocal quality observed suspect some component of overall task confusion is hinderance   Pt was distractible and required continued redirection to task   PATIENT EDUCATION: Education details: See Above  Person educated: Patient Education method: Explanation Education comprehension: verbalized understanding  HOME EXERCISE PROGRAM:   Hold a deep breath, release a vocalized "U"   EMST (Pharmacologist)      GOALS: Goals reviewed with patient? Yes  SHORT TERM GOALS: Target date: 10 sessions  To determine optimal resistance levels for Respiratory Muscle Training (RMT) for improving increase hyolaryngeal elevation and strengthen cough for airway clearance, patient will participate in evaluation (and re-assessment as needed) of maximum expiratory pressure (MEP) and maximum inspiratory pressure (MIP) at next therapy session.  Baseline: Goal status: INITIAL  2.  The patient will eliminate phonotraumatic behaviors such as chronic throat clearing, by substituting non-traumatic methods to clear mucus.  Baseline:  Goal status: INITIAL    LONG TERM GOALS: Target date:   Patient will improve perception of swallowing as indicated by an improvement in EAT-10 score to 5 (Baseline = 37) by 12 weeks from initial swallowing therapy session. Baseline:  Goal status: INITIAL  2.  Patient will consume recommended diet using strategies and compensations without overt s/sx aspiration >95% of the time.  Baseline:  Goal status: INITIAL  3.  Patient will participate in objective swallowing evaluation (MBSS) to identify safest diet recommendation as well as therapeutic targets prior to discharge from services.  Baseline:  Goal status: INITIAL  4.  The patient will increase hydration for an eventual goal of 6-8 glasses per day and limit caffeine intake (to maximum of 1-2, 8 oz cups/day), as measured by patient  report.  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 As an additional result of her vocal fold paresis, she has confirmed silent aspiration of thin liquids on most recent Modified Barium Swallow study. Pt struggled with independent completion of EMST 75 and vocal cord adduction exercises d/t mild confusion.   Pt not certain how long  she will be able to afford thickened liquid.   OBJECTIVE IMPAIRMENTS include voice disorder and dysphagia. These impairments are limiting patient from managing appointments, household responsibilities, ADLs/IADLs, effectively communicating at home and in community, and safety when swallowing. Factors affecting potential to achieve goals and functional outcome are severity of impairments.. Patient will benefit from skilled SLP services to address above impairments and improve overall function.  REHAB POTENTIAL: Fair left vocal cord paresis   PLAN: SLP FREQUENCY: 1-2x/week  SLP DURATION: 8 weeks  PLANNED INTERVENTIONS: Aspiration precaution training, Pharyngeal strengthening exercises, Diet toleration management , Trials of upgraded texture/liquids, SLP instruction and feedback, and Patient/family education    Sherron Mummert B. Dreama Saa, M.S., CCC-SLP, Tree surgeon Certified Brain Injury Specialist [facility]  [facility] Office [phone] Ascom [phone] Fax [phone]

## 2022-08-05 ENCOUNTER — Telehealth: Payer: Self-pay | Admitting: *Deleted

## 2022-08-05 ENCOUNTER — Other Ambulatory Visit: Payer: Self-pay | Admitting: *Deleted

## 2022-08-05 ENCOUNTER — Ambulatory Visit: Payer: Medicare HMO | Admitting: Speech Pathology

## 2022-08-05 DIAGNOSIS — R1313 Dysphagia, pharyngeal phase: Secondary | ICD-10-CM

## 2022-08-05 DIAGNOSIS — R490 Dysphonia: Secondary | ICD-10-CM | POA: Diagnosis not present

## 2022-08-05 DIAGNOSIS — C3491 Malignant neoplasm of unspecified part of right bronchus or lung: Secondary | ICD-10-CM

## 2022-08-05 NOTE — Therapy (Addendum)
OUTPATIENT SPEECH LANGUAGE PATHOLOGY TREATMENT NOTE   Patient Name: Amber Glenn MRN: [id number] DOB:[birth year], 72 y.o., female Today's Date: 08/05/2022  PCP: Joen Laura, MD  REFERRING PROVIDER: Linus Salmons, MD   END OF SESSION:   End of Session - 08/05/22 1303     Visit Number 5    Number of Visits 17    Date for SLP Re-Evaluation 09/15/22    Authorization Type [employer] H[employer]    Progress Note Due on Visit 10    SLP Start Time 1300    SLP Stop Time  1400    SLP Time Calculation (min) 60 min    Activity Tolerance Patient tolerated treatment well             Past Medical History:  Diagnosis Date   Arthritis    hands and feet   Cancer associated pain    Cataract    right   Complication of anesthesia    sometimes have a hard time waking up   Diabetes mellitus    Borderline Diabetes   GERD (gastroesophageal reflux disease)    HTN (hypertension)    Hyperlipidemia    Motion sickness    Post corneal transplant    right   Past Surgical History:  Procedure Laterality Date   APPLICATION OF INTRAOPERATIVE CT SCAN  02/15/2022   Procedure: APPLICATION OF INTRAOPERATIVE CT SCAN;  Surgeon: Venetia Night, MD;  Location: [facility] ORS;  Service: Neurosurgery;;   BASAL CELL CARCINOMA EXCISION     Dr. Lorn Junes   CARPAL TUNNEL RELEASE     right   COLONOSCOPY WITH PROPOFOL N/A 12/03/2015   Procedure: COLONOSCOPY WITH PROPOFOL;  Surgeon: Scot Jun, MD;  Location: [facility] ENDOSCOPY;  Service: Endoscopy;  Laterality: N/A;   COLONOSCOPY WITH PROPOFOL N/A 02/22/2020   Procedure: COLONOSCOPY WITH PROPOFOL;  Surgeon: Regis Bill, MD;  Location: [facility] ENDOSCOPY;  Service: Endoscopy;  Laterality: N/A;   ESOPHAGOGASTRODUODENOSCOPY (EGD) WITH PROPOFOL N/A 02/22/2020   Procedure: ESOPHAGOGASTRODUODENOSCOPY (EGD) WITH PROPOFOL;  Surgeon: Regis Bill, MD;  Location: [facility] ENDOSCOPY;  Service: Endoscopy;  Laterality: N/A;   ESOPHAGOGASTRODUODENOSCOPY (EGD) WITH  PROPOFOL N/A 06/07/2022   Procedure: ESOPHAGOGASTRODUODENOSCOPY (EGD) WITH PROPOFOL;  Surgeon: Regis Bill, MD;  Location: [facility] ENDOSCOPY;  Service: Endoscopy;  Laterality: N/A;   HAMMER TOE SURGERY Right 05/23/2019   Procedure: HAMMER TOE CORRECTION;  Surgeon: Gwyneth Revels, DPM;  Location: [facility];  Service: Podiatry;  Laterality: Right;  Diabetic   IR BONE TUMOR(S)RF ABLATION  04/30/2022   IR IMAGING GUIDED PORT INSERTION  04/14/2022   IR KYPHO LUMBAR INC FX REDUCE BONE BX UNI/BIL CANNULATION INC/IMAGING  04/30/2022   IR RADIOLOGIST EVAL & MGMT  03/24/2022   IR RADIOLOGIST EVAL & MGMT  06/23/2022   OSTECTOMY Right 05/23/2019   Procedure: DOUBLE OSTEOTOMY RIGHT;  Surgeon: Gwyneth Revels, DPM;  Location: [facility];  Service: Podiatry;  Laterality: Right;   SKIN CANCER EXCISION     Patient Active Problem List   Diagnosis Date Noted   Iron deficiency anemia 04/16/2022   Acute on chronic respiratory failure with hypoxia 04/01/2022   Multifocal pneumonia 04/01/2022   Superficial thrombophlebitis 04/01/2022   Acute pulmonary embolism 03/31/2022   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 site 03/03/2022   Metastatic adenocarcinoma 02/18/2022   Acute low back pain 02/17/2022   Adenocarcinoma 02/17/2022   Delirium due to another medical condition 02/17/2022   Palliative care encounter 02/17/2022   Pathologic lumbar vertebral fracture  02/11/2022   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   Neoplasm related pain 02/11/2022   Supraclavicular adenopathy 02/11/2022   Mediastinal mass 02/11/2022   Goals of care, counseling/discussion 02/11/2022   Pathologic fracture of lumbar vertebra, initial encounter 02/11/2022   Pathologic fracture 02/10/2022   Pyuria 02/10/2022   Diarrhea    COVID-19 08/18/2019   Thumb pain 06/26/2015   Medicare annual wellness visit, subsequent 03/14/2015   Arthralgia 09/02/2014   Hot flashes 01/21/2014   Left shoulder pain 09/20/2013    Unspecified constipation 08/30/2012   Routine general medical examination at a health care facility 05/29/2012   Screening for colon cancer 05/29/2012   Hypertension 08/18/2011   Osteoarthritis 03/20/2009   Diabetes mellitus type 2, controlled 11/14/2008   BASAL CELL CARCINOMA, FACE 12/06/2006   Hyperlipidemia 12/06/2006   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  Pt sustained a compression fracture of L1 s/p fixation by neurosurgery (02/10/2022).  Further testing included CT Chest Abdomen Pelvis with Contrast (02/10/2022) that revealed 1. 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 There is also mild left supraclavicular adenopathy. 2. Scattered tree-in-bud reticulonodular opacities in the lungs, right greater than left, characteristic for atypical infectious bronchiolitis. One of the largest nodules is a 12 by 7 by 11 mm nodule in the right lateral costophrenic angle, which could be malignant or infectious. 3. Subtle abnormal hypoenhancement inferiorly in the pancreatic body measuring about 1.2 by 0.7 by 1.3 cm, along with some indistinct hypoenhancement in the pancreatic  head. This could be inflammatory but infiltrative pancreatic adenocarcinoma cannot be excluded. This warrants detailed workup with either pancreatic protocol CT or MRI with and without contrast. PET-CT could also be helpful in this case. 4. As shown on lumbar spine MRI, there is a pathologic (malignant) fracture at L1 with diffuse abnormal lysis involving the vertebral body, bilateral pedicles, and bilateral transverse processes at this level with about 65% of vertebral body height and with a posterior bulging contour of the vertebral body by about 5-6 mm. Further detail on dedicated lumbar spine CT. 5. Mild acute sigmoid colon diverticulitis. Tumor in the vicinity of the local inflammation along the sigmoid colon is not totally excluded but is a less likely differential diagnostic consideration. 6. Aortic atherosclerosis.  Coronary atherosclerosis. Pt underwent an ultrasound-guided core biopsy of "hypoechoic, abnormal appearing lymph node just lateral to the left internal jugular vein measuring approximately 1.2x1.0x1.2 cm." Results positive for lung adenocarcinoma. Pt underwent Palliative radiation to spine as well as combination of chemotherapy, immunotherapy and Keytruda.    Following biopsy of lymph node, pt began experiencing dysphagia and hoarseness. She had a Barium Swallow on 03/24/22: "Mild relative narrowing of the distal esophagus just proximal to  the gastroesophageal junction concerning for a mild stricture  restricting the passage of a 13 mm barium tablet.  Mild tertiary contractions of the esophagus as can be seen with mild spasm." EGD on 06/07/22: "No endoscopic abnormality was evident in the esophagus to explain the patient's complaint of dysphagia." MBSS on 07/05/2022 : " multifactorial oropharyngeal and pharyngoesophageal dysphagia. Motor and sensory impairments are noted, with resulting silent aspiration of thin liquids. Anterior hyoid excursion, laryngeal closure, and epiglottic deflection are  incomplete, which results in frank penetration and aspiration of thin liquid during the swallow which was unsensed. Cued cough was not effective to fully eject aspiration, and was subjectively weak. Strategies including 3 second bolus hold, as well as left and right head turn did not prevent penetration/aspiration. Pharyngeal stripping wave was diminished, and in anterior-posterior view unilateral bulging occurred (left), suggesting unilateral weakness. 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 No  PATIENT GOALS:   "to drink thin liquids; being able to speak and talk plain again"   OBJECTIVE:   TODAY'S TREATMENT: Skilled treatment session focused on pt's dysphonia goals. SLP facilitated session by providing the following interventions:   EMST 75 set at 15 cmH2O - pt unable to complete more than 2 d/t severity of coughing  Phonation with vocal adduction using POPS and SOCT With maximal multimodal assistance, pt unable to effectively replicate activity itself to establish improved phonation d/t distractibility, decreased ability to understand and impulsivity  Pt with questions about cough, secure chat sent to pt's oncology (Dr Smith Robert) regarding pt's cough and questions  Also sent written note home with pt requesting pt's daughter attend her ST session to promote improve accuracy with HEP  PATIENT EDUCATION: Education details: See Above  Person educated: Patient Education method: Explanation Education comprehension: verbalized understanding  HOME EXERCISE PROGRAM:   Hold a deep  breath, release a vocalized "U"   EMST (Expiratory Muscle Strength Training)     GOALS: Goals reviewed with patient? Yes  SHORT TERM GOALS: Target date: 10 sessions  To determine optimal resistance levels for Respiratory Muscle Training (RMT) for improving increase hyolaryngeal elevation and strengthen cough for airway clearance, patient will participate in evaluation (and re-assessment as needed) of maximum expiratory pressure (MEP) and maximum inspiratory pressure (MIP) at next therapy session.  Baseline: Goal status: INITIAL  2.  The patient will eliminate phonotraumatic behaviors such as chronic throat clearing, by substituting non-traumatic methods to clear mucus.  Baseline:  Goal status: INITIAL    LONG TERM GOALS: Target date: 09/15/2022  Patient will improve perception of swallowing as indicated by an improvement in EAT-10 score to 5 (Baseline = 37) by 12 weeks from initial swallowing therapy session. Baseline:  Goal status: INITIAL  2.  Patient will consume recommended diet using strategies and compensations without overt s/sx aspiration >95% of the time.  Baseline:  Goal status: INITIAL  3.  Patient will participate in objective swallowing evaluation (MBSS) to identify safest diet recommendation as well as therapeutic targets prior to discharge from services.  Baseline:  Goal status: INITIAL  4.  The patient will increase hydration for an eventual goal of 6-8 glasses per day and limit caffeine intake (  to maximum of 1-2, 8 oz cups/day), as measured by patient report.  Baseline:  Goal status: INITIAL   ASSESSMENT:  CLINICAL IMPRESSION: Pt presents with moderate to severe dysphonia and dsyphagia that is intermittently responding to therapy.   OBJECTIVE IMPAIRMENTS include voice disorder and dysphagia. These impairments are limiting patient from managing appointments, household responsibilities, ADLs/IADLs, effectively communicating at home and in community, and  safety when swallowing. Factors affecting potential to achieve goals and functional outcome are severity of impairments.. Patient will benefit from skilled SLP services to address above impairments and improve overall function.  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 Dreama Saa, M.S., CCC-SLP, Tree surgeon Certified Brain Injury Specialist [facility]  [facility] Rehabilitation Services Office [phone] Ascom [phone] Fax [phone]

## 2022-08-05 NOTE — Telephone Encounter (Signed)
Called patient per Marcelyn Ditty, NP. Arranged for smc apts tomorrow for patient. Port lab/ smc- 10 am tomorrow. Pt agreeable.

## 2022-08-06 ENCOUNTER — Ambulatory Visit
Admission: RE | Admit: 2022-08-06 | Discharge: 2022-08-06 | Disposition: A | Discharge status: Home / Self Care | Payer: Medicare HMO | Source: Ambulatory Visit | Attending: Hospice and Palliative Medicine | Admitting: Hospice and Palliative Medicine

## 2022-08-06 ENCOUNTER — Inpatient Hospital Stay: Payer: Medicare HMO

## 2022-08-06 ENCOUNTER — Ambulatory Visit
Admission: RE | Admit: 2022-08-06 | Discharge: 2022-08-06 | Disposition: A | Discharge status: Home / Self Care | Payer: Medicare HMO | Attending: Hospice and Palliative Medicine | Admitting: Hospice and Palliative Medicine

## 2022-08-06 ENCOUNTER — Inpatient Hospital Stay (HOSPITAL_BASED_OUTPATIENT_CLINIC_OR_DEPARTMENT_OTHER): Payer: Medicare HMO | Admitting: Hospice and Palliative Medicine

## 2022-08-06 DIAGNOSIS — E860 Dehydration: Secondary | ICD-10-CM | POA: Diagnosis not present

## 2022-08-06 DIAGNOSIS — C3491 Malignant neoplasm of unspecified part of right bronchus or lung: Secondary | ICD-10-CM

## 2022-08-06 DIAGNOSIS — Z5111 Encounter for antineoplastic chemotherapy: Secondary | ICD-10-CM | POA: Diagnosis not present

## 2022-08-06 LAB — CMP (CANCER CENTER ONLY)
ALT: 18 U/L (ref 0–44)
AST: 23 U/L (ref 15–41)
Albumin: 3.3 g/dL — ABNORMAL LOW (ref 3.5–5.0)
Alkaline Phosphatase: 67 U/L (ref 38–126)
Anion gap: 6 (ref 5–15)
BUN: 51 mg/dL — ABNORMAL HIGH (ref 8–23)
CO2: 23 mmol/L (ref 22–32)
Calcium: 8.3 mg/dL — ABNORMAL LOW (ref 8.9–10.3)
Chloride: 108 mmol/L (ref 98–111)
Creatinine: 1.59 mg/dL — ABNORMAL HIGH (ref 0.44–1.00)
GFR, Estimated: 34 mL/min — ABNORMAL LOW (ref >60)
Glucose, Bld: 187 mg/dL — ABNORMAL HIGH (ref 70–99)
Potassium: 4 mmol/L (ref 3.5–5.1)
Sodium: 137 mmol/L (ref 135–145)
Total Bilirubin: 0.4 mg/dL (ref 0.3–1.2)
Total Protein: 6.7 g/dL (ref 6.5–8.1)

## 2022-08-06 LAB — CBC WITH DIFFERENTIAL (CANCER CENTER ONLY)
Abs Immature Granulocytes: 0.03 10*3/uL (ref 0.00–0.07)
Basophils Absolute: 0 10*3/uL (ref 0.0–0.1)
Basophils Relative: 0 %
Eosinophils Absolute: 0 10*3/uL (ref 0.0–0.5)
Eosinophils Relative: 1 %
HCT: 27.7 % — ABNORMAL LOW (ref 36.0–46.0)
Hemoglobin: 8.7 g/dL — ABNORMAL LOW (ref 12.0–15.0)
Immature Granulocytes: 1 %
Lymphocytes Relative: 35 %
Lymphs Abs: 0.9 10*3/uL (ref 0.7–4.0)
MCH: 31.4 pg (ref 26.0–34.0)
MCHC: 31.4 g/dL (ref 30.0–36.0)
MCV: 100 fL (ref 80.0–100.0)
Monocytes Absolute: 0.2 10*3/uL (ref 0.1–1.0)
Monocytes Relative: 7 %
Neutro Abs: 1.4 10*3/uL — ABNORMAL LOW (ref 1.7–7.7)
Neutrophils Relative %: 56 %
Platelet Count: 144 10*3/uL — ABNORMAL LOW (ref 150–400)
RBC: 2.77 MIL/uL — ABNORMAL LOW (ref 3.87–5.11)
RDW: 18.3 % — ABNORMAL HIGH (ref 11.5–15.5)
WBC Count: 2.6 10*3/uL — ABNORMAL LOW (ref 4.0–10.5)
nRBC: 0 % (ref 0.0–0.2)

## 2022-08-06 LAB — MAGNESIUM: Magnesium: 2.3 mg/dL (ref 1.7–2.4)

## 2022-08-06 MED ORDER — HEPARIN SOD (PORK) LOCK FLUSH 100 UNIT/ML IV SOLN
500.0000 [IU] | Freq: Once | INTRAVENOUS | Status: DC
  Filled 2022-08-06: qty 5

## 2022-08-06 MED ORDER — SODIUM CHLORIDE 0.9% FLUSH
10.0000 mL | Freq: Once | INTRAVENOUS | Status: AC
  Administered 2022-08-06: 10 mL via INTRAVENOUS
  Filled 2022-08-06: qty 10

## 2022-08-06 MED ORDER — ONDANSETRON HCL 8 MG PO TABS
8.0000 mg | ORAL_TABLET | Freq: Three times a day (TID) | ORAL | 1 refills | Status: DC | PRN
Start: 2022-08-06 — End: 2022-10-08

## 2022-08-06 MED ORDER — OXYCODONE HCL 20 MG PO TABS: 10.0000 mg | ORAL_TABLET | ORAL | 0 refills | Status: DC | PRN

## 2022-08-06 MED ORDER — SODIUM CHLORIDE 0.9 % IV SOLN
INTRAVENOUS | Status: DC
  Filled 2022-08-06 (×2): qty 250

## 2022-08-06 MED ORDER — LIDOCAINE-PRILOCAINE 2.5-2.5 % EX CREA: 1.0000 | TOPICAL_CREAM | CUTANEOUS | 2 refills | Status: DC | PRN

## 2022-08-06 NOTE — Progress Notes (Signed)
Symptom Management Clinic [facility] at [facility] Telephone:([phone] Fax:([phone]) [phone]  Patient Care Team: Dortha Kern, MD as PCP - General (Family Medicine) Glory Buff, RN as Oncology Nurse Navigator   NAME OF PATIENT: Amber Glenn  [id number]  [birth year]   DATE OF VISIT: 08/06/22  REASON FOR CONSULT: Amber Glenn is a 72 y.o. female with multiple medical problems including stage IV lung cancer with bone metastases and pathologic compression fracture of L1 status post percutaneous fixation by neurosurgery followed by RFA due to intractable pain.   Patient is on systemic chemo/immunotherapy.  INTERVAL HISTORY: Patient saw Dr. Smith Robert on 07/30/2022 at which time she received cycle 2 maintenance Alimta Keytruda.  Patient has had recurrent aspiration with multiple previous courses of antibiotics.  Patient is followed by SLP he saw patient yesterday and reported nausea and vomiting and requested that we follow-up with patient today in clinic for symptom management.  Today, patient reports that her nausea and vomiting have resolved.  She endorses minimal oral intake and just feels "tired" but denies other symptomatic complaints.  She says that she has a chronic cough with intermittent shortness of breath but states that it is no worse in severity that has been for the past several months.  Denies any neurologic complaints. Denies recent fevers or illnesses. Denies any easy bleeding or bruising. Reports poor appetite. Denies chest pain. Denies any nausea, vomiting, constipation, or diarrhea. Denies urinary complaints. Patient offers no further specific complaints today.   PAST MEDICAL HISTORY: Past Medical History:  Diagnosis Date   Arthritis    hands and feet   Cancer associated pain    Cataract    right   Complication of anesthesia    sometimes have a hard time waking up   Diabetes mellitus    Borderline Diabetes   GERD (gastroesophageal reflux  disease)    HTN (hypertension)    Hyperlipidemia    Motion sickness    Post corneal transplant    right    PAST SURGICAL HISTORY:  Past Surgical History:  Procedure Laterality Date   APPLICATION OF INTRAOPERATIVE CT SCAN  02/15/2022   Procedure: APPLICATION OF INTRAOPERATIVE CT SCAN;  Surgeon: Venetia Night, MD;  Location: [facility] ORS;  Service: Neurosurgery;;   BASAL CELL CARCINOMA EXCISION     Dr. Lorn Junes   CARPAL TUNNEL RELEASE     right   COLONOSCOPY WITH PROPOFOL N/A 12/03/2015   Procedure: COLONOSCOPY WITH PROPOFOL;  Surgeon: Scot Jun, MD;  Location: [facility] ENDOSCOPY;  Service: Endoscopy;  Laterality: N/A;   COLONOSCOPY WITH PROPOFOL N/A 02/22/2020   Procedure: COLONOSCOPY WITH PROPOFOL;  Surgeon: Regis Bill, MD;  Location: [facility] ENDOSCOPY;  Service: Endoscopy;  Laterality: N/A;   ESOPHAGOGASTRODUODENOSCOPY (EGD) WITH PROPOFOL N/A 02/22/2020   Procedure: ESOPHAGOGASTRODUODENOSCOPY (EGD) WITH PROPOFOL;  Surgeon: Regis Bill, MD;  Location: [facility] ENDOSCOPY;  Service: Endoscopy;  Laterality: N/A;   ESOPHAGOGASTRODUODENOSCOPY (EGD) WITH PROPOFOL N/A 06/07/2022   Procedure: ESOPHAGOGASTRODUODENOSCOPY (EGD) WITH PROPOFOL;  Surgeon: Regis Bill, MD;  Location: [facility] ENDOSCOPY;  Service: Endoscopy;  Laterality: N/A;   HAMMER TOE SURGERY Right 05/23/2019   Procedure: HAMMER TOE CORRECTION;  Surgeon: Gwyneth Revels, DPM;  Location: [facility];  Service: Podiatry;  Laterality: Right;  Diabetic   IR BONE TUMOR(S)RF ABLATION  04/30/2022   IR IMAGING GUIDED PORT INSERTION  04/14/2022   IR KYPHO LUMBAR INC FX REDUCE BONE BX UNI/BIL CANNULATION INC/IMAGING  04/30/2022   IR RADIOLOGIST EVAL &  MGMT  03/24/2022   IR RADIOLOGIST EVAL & MGMT  06/23/2022   OSTECTOMY Right 05/23/2019   Procedure: DOUBLE OSTEOTOMY RIGHT;  Surgeon: Gwyneth Revels, DPM;  Location: [facility];  Service: Podiatry;  Laterality: Right;   SKIN CANCER EXCISION      HEMATOLOGY/ONCOLOGY  HISTORY:  Oncology History  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 site [facility])  03/03/2022 Initial Diagnosis   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 site [facility])   03/03/2022 Cancer Staging   Staging form: Lung, AJCC 8th Edition - Clinical stage from 03/03/2022: Stage IV (cT2, cN3, pM1) - Signed by Creig Hines, MD on 03/03/2022   04/16/2022 -  Chemotherapy   Patient is on Treatment Plan : LUNG Carboplatin (5) + Pemetrexed (500) + Pembrolizumab (200) D1 q21d Induction x 4 cycles / Maintenance Pemetrexed (500) + Pembrolizumab (200) D1 q21d       ALLERGIES:  has No Known Allergies.  MEDICATIONS:  Current Outpatient Medications  Medication Sig Dispense Refill   acetaminophen (TYLENOL) 500 MG tablet Take 500 mg by mouth every 6 (six) hours as needed for moderate pain, mild pain, fever or headache.     amoxicillin-clavulanate (AUGMENTIN) 875-125 MG tablet Take 1 tablet by mouth 2 (two) times daily. 14 tablet 0   Ascorbic Acid (VITAMIN C) 1000 MG tablet Take 1,000 mg by mouth daily.       carvedilol (COREG) 3.125 MG tablet Take 3.125 mg by mouth 2 (two) times daily with a meal.     cyclobenzaprine (FLEXERIL) 5 MG tablet Take 1 tablet (5 mg total) by mouth 3 (three) times daily as needed for muscle spasms. 30 tablet 0   dexamethasone (DECADRON) 4 MG tablet TAKE 1 TABLET 2 TIMES DAILY STARTING DAY BEFORE PEMETREXED. THEN TAKE 2 TABS DAILY FOR 3 DAYS STARTING DAY AFTER CARBOPLATIN. TAKE WITH FOOD. 30 tablet 1   Docusate Calcium (STOOL SOFTENER PO) Take 1 capsule by mouth daily as needed (constipation).     ELIQUIS 5 MG TABS tablet TAKE 1 TABLET BY MOUTH TWICE A DAY 60 tablet 2   folic acid (FOLVITE) 1 MG tablet Take 1 tablet (1 mg total) by mouth daily. Start 7 days before pemetrexed chemotherapy. Continue until 21 days after pemetrexed completed. 100 tablet 3   gabapentin (NEURONTIN) 300 MG capsule Take 1 capsule (300 mg total) by mouth 3 (three) times daily. 90  capsule 2   lidocaine-prilocaine (EMLA) cream Apply 1 Application topically as needed. 30 g 2   LORazepam (ATIVAN) 0.5 MG tablet Take 1 tablet (0.5 mg total) by mouth every 8 (eight) hours as needed for anxiety. 30 tablet 0   losartan (COZAAR) 100 MG tablet Take 100 mg by mouth daily.     metFORMIN (GLUCOPHAGE) 500 MG tablet Take by mouth 2 (two) times daily with a meal.     metoprolol tartrate (LOPRESSOR) 25 MG tablet Take 1 tablet (25 mg total) by mouth 2 (two) times daily. 60 tablet 0   Multiple Vitamin (MULTIVITAMIN) capsule Take 1 capsule by mouth daily.       OLANZapine (ZYPREXA) 10 MG tablet TAKE 1 TABLET BY MOUTH EVERYDAY AT BEDTIME 90 tablet 1   omeprazole (PRILOSEC) 20 MG capsule Take 1 capsule (20 mg total) by mouth daily. 90 capsule 3   ondansetron (ZOFRAN) 8 MG tablet Take 1 tablet (8 mg total) by mouth every 8 (eight) hours as needed for nausea or vomiting. Start on the third day after carboplatin. 30 tablet  1   oxyCODONE ER (XTAMPZA ER) 18 MG C12A Take 1 capsule by mouth every 12 (twelve) hours. 60 capsule 0   Oxycodone HCl 20 MG TABS Take 0.5-1 tablets (10-20 mg total) by mouth every 4 (four) hours as needed. 60 tablet 0   polyethylene glycol (MIRALAX / GLYCOLAX) 17 g packet Take 17 g by mouth daily. 14 each 0   prednisoLONE acetate (PRED MILD) 0.12 % ophthalmic suspension Place 1 drop into both eyes daily.     prochlorperazine (COMPAZINE) 10 MG tablet Take 1 tablet (10 mg total) by mouth every 6 (six) hours as needed for nausea or vomiting. 30 tablet 2   simvastatin (ZOCOR) 40 MG tablet TAKE 1 TABLET EVERY DAY  AT  6:00 PM 90 tablet 3   No current facility-administered medications for this visit.   Facility-Administered Medications Ordered in Other Visits  Medication Dose Route Frequency Provider Last Rate Last Admin   0.9 %  sodium chloride infusion   Intravenous Continuous Iyanla Eilers, Daryl Eastern, NP 999 mL/hr at 08/06/22 1019 New Bag at 08/06/22 1019   heparin lock flush 100  unit/mL  500 Units Intravenous Once Kyndal Heringer, Daryl Eastern, NP        VITAL SIGNS: There were no vitals taken for this visit. There were no vitals filed for this visit.  Estimated body mass index is 22.85 kg/m as calculated from the following:   Height as of 07/30/22: 5' (1.524 m).   Weight as of 07/30/22: 117 lb (53.1 kg).  LABS: CBC:    Component Value Date/Time   WBC 2.6 (L) 08/06/2022 1006   WBC 6.3 07/30/2022 0818   HGB 8.7 (L) 08/06/2022 1006   HCT 27.7 (L) 08/06/2022 1006   PLT 144 (L) 08/06/2022 1006   MCV 100.0 08/06/2022 1006   NEUTROABS 1.4 (L) 08/06/2022 1006   LYMPHSABS 0.9 08/06/2022 1006   MONOABS 0.2 08/06/2022 1006   EOSABS 0.0 08/06/2022 1006   BASOSABS 0.0 08/06/2022 1006   Comprehensive Metabolic Panel:    Component Value Date/Time   NA 137 08/06/2022 1006   K 4.0 08/06/2022 1006   CL 108 08/06/2022 1006   CO2 23 08/06/2022 1006   BUN 51 (H) 08/06/2022 1006   CREATININE 1.59 (H) 08/06/2022 1006   GLUCOSE 187 (H) 08/06/2022 1006   CALCIUM 8.3 (L) 08/06/2022 1006   AST 23 08/06/2022 1006   ALT 18 08/06/2022 1006   ALKPHOS 67 08/06/2022 1006   BILITOT 0.4 08/06/2022 1006   PROT 6.7 08/06/2022 1006   ALBUMIN 3.3 (L) 08/06/2022 1006    RADIOGRAPHIC STUDIES: No results found.  PERFORMANCE STATUS (ECOG) : 2 - Symptomatic, <50% confined to bed  Review of Systems Unless otherwise noted, a complete review of systems is negative.  Physical Exam General: NAD Cardiovascular: regular rate and rhythm Pulmonary: clear ant fields Abdomen: soft, nontender, + bowel sounds GU: no suprapubic tenderness Extremities: no edema, no joint deformities Skin: no rashes Neurological: Weakness but otherwise nonfocal  IMPRESSION/PLAN: Stage IV lung cancer -on treatment with maintenance Alimta Keytruda.  Improved CT on 07/02/2022.  Hypotension -systolic pressure 70s to 80s initially.  Labs appear consistent with dehydration with slight worsening AKI, likely secondary to  minimal oral intake.  Patient is nontoxic-appearing with benign exam.  Will proceed with IV fluids.  Patient recommended to hold Cozaar.  Dysphagia and recurrent aspiration-patient finished course of Augmentin yesterday.  She continues to have chronic pulmonary symptoms with cough and intermittent shortness of breath.  Patient is  working with SLP and has had recent evaluation by ENT. Discussed with Dr. Smith Robert who recommended referral to pulmonary. Will send for chest x-ray today.   Neoplasm related pain -stable on Xtampza/oxycodone IR.  Refill oxycodone IR per daughter's request.  Will bring patient back next week for labs/fluids.  ED triggers reviewed with patient's daughter.  Case and plan discussed with Dr. Smith Robert  Patient expressed understanding and was in agreement with this plan. She also understands that She can call clinic at any time with any questions, concerns, or complaints.   Thank you for allowing me to participate in the care of this very pleasant patient.   Time Total: 25 minutes  Visit consisted of counseling and education dealing with the complex and emotionally intense issues of symptom management in the setting of serious illness.Greater than 50%  of this time was spent counseling and coordinating care related to the above assessment and plan.  Signed by: Laurette Schimke, PhD, NP-C

## 2022-08-09 ENCOUNTER — Other Ambulatory Visit: Payer: Medicare HMO

## 2022-08-09 ENCOUNTER — Ambulatory Visit: Payer: Medicare HMO

## 2022-08-09 ENCOUNTER — Other Ambulatory Visit: Payer: Self-pay | Admitting: *Deleted

## 2022-08-09 ENCOUNTER — Inpatient Hospital Stay: Payer: Medicare HMO

## 2022-08-09 ENCOUNTER — Other Ambulatory Visit: Payer: Self-pay | Admitting: Oncology

## 2022-08-09 VITALS — BP 114/57 | HR 98 | Temp 96.3°F | Resp 20

## 2022-08-09 DIAGNOSIS — D649 Anemia, unspecified: Secondary | ICD-10-CM

## 2022-08-09 DIAGNOSIS — Z5111 Encounter for antineoplastic chemotherapy: Secondary | ICD-10-CM | POA: Diagnosis not present

## 2022-08-09 DIAGNOSIS — Z95828 Presence of other vascular implants and grafts: Secondary | ICD-10-CM

## 2022-08-09 DIAGNOSIS — C3491 Malignant neoplasm of unspecified part of right bronchus or lung: Secondary | ICD-10-CM

## 2022-08-09 DIAGNOSIS — E860 Dehydration: Secondary | ICD-10-CM

## 2022-08-09 LAB — TYPE AND SCREEN

## 2022-08-09 LAB — CMP (CANCER CENTER ONLY)
ALT: 17 U/L (ref 0–44)
AST: 25 U/L (ref 15–41)
Albumin: 3.2 g/dL — ABNORMAL LOW (ref 3.5–5.0)
Alkaline Phosphatase: 72 U/L (ref 38–126)
Anion gap: 6 (ref 5–15)
BUN: 22 mg/dL (ref 8–23)
CO2: 22 mmol/L (ref 22–32)
Calcium: 8.1 mg/dL — ABNORMAL LOW (ref 8.9–10.3)
Chloride: 110 mmol/L (ref 98–111)
Creatinine: 1.17 mg/dL — ABNORMAL HIGH (ref 0.44–1.00)
GFR, Estimated: 49 mL/min — ABNORMAL LOW (ref >60)
Glucose, Bld: 129 mg/dL — ABNORMAL HIGH (ref 70–99)
Potassium: 3.9 mmol/L (ref 3.5–5.1)
Sodium: 138 mmol/L (ref 135–145)
Total Bilirubin: 0.4 mg/dL (ref 0.3–1.2)
Total Protein: 6.6 g/dL (ref 6.5–8.1)

## 2022-08-09 LAB — CBC WITH DIFFERENTIAL (CANCER CENTER ONLY)
Abs Immature Granulocytes: 0.02 10*3/uL (ref 0.00–0.07)
Basophils Absolute: 0 10*3/uL (ref 0.0–0.1)
Basophils Relative: 0 %
Eosinophils Absolute: 0 10*3/uL (ref 0.0–0.5)
Eosinophils Relative: 1 %
HCT: 23.6 % — ABNORMAL LOW (ref 36.0–46.0)
Hemoglobin: 7.5 g/dL — ABNORMAL LOW (ref 12.0–15.0)
Immature Granulocytes: 1 %
Lymphocytes Relative: 33 %
Lymphs Abs: 1 10*3/uL (ref 0.7–4.0)
MCH: 31.6 pg (ref 26.0–34.0)
MCHC: 31.8 g/dL (ref 30.0–36.0)
MCV: 99.6 fL (ref 80.0–100.0)
Monocytes Absolute: 0.7 10*3/uL (ref 0.1–1.0)
Monocytes Relative: 21 %
Neutro Abs: 1.4 10*3/uL — ABNORMAL LOW (ref 1.7–7.7)
Neutrophils Relative %: 44 %
Platelet Count: 76 10*3/uL — ABNORMAL LOW (ref 150–400)
RBC: 2.37 MIL/uL — ABNORMAL LOW (ref 3.87–5.11)
RDW: 18 % — ABNORMAL HIGH (ref 11.5–15.5)
WBC Count: 3.2 10*3/uL — ABNORMAL LOW (ref 4.0–10.5)
nRBC: 0 % (ref 0.0–0.2)

## 2022-08-09 LAB — PREPARE RBC (CROSSMATCH)

## 2022-08-09 MED ORDER — SODIUM CHLORIDE 0.9% FLUSH
10.0000 mL | Freq: Once | INTRAVENOUS | Status: AC
  Administered 2022-08-09: 10 mL via INTRAVENOUS
  Filled 2022-08-09: qty 10

## 2022-08-09 MED ORDER — SODIUM CHLORIDE 0.9 % IV SOLN
INTRAVENOUS | Status: AC
  Filled 2022-08-09 (×2): qty 250

## 2022-08-09 MED ORDER — HEPARIN SOD (PORK) LOCK FLUSH 100 UNIT/ML IV SOLN
500.0000 [IU] | Freq: Once | INTRAVENOUS | Status: AC
  Administered 2022-08-09: 500 [IU]
  Filled 2022-08-09: qty 5

## 2022-08-10 ENCOUNTER — Inpatient Hospital Stay: Payer: Medicare HMO

## 2022-08-10 ENCOUNTER — Ambulatory Visit: Payer: Medicare HMO | Admitting: Speech Pathology

## 2022-08-10 DIAGNOSIS — R1313 Dysphagia, pharyngeal phase: Secondary | ICD-10-CM

## 2022-08-10 DIAGNOSIS — Z5111 Encounter for antineoplastic chemotherapy: Secondary | ICD-10-CM | POA: Diagnosis not present

## 2022-08-10 DIAGNOSIS — R490 Dysphonia: Secondary | ICD-10-CM

## 2022-08-10 DIAGNOSIS — D649 Anemia, unspecified: Secondary | ICD-10-CM

## 2022-08-10 LAB — TYPE AND SCREEN: ABO/RH(D): O NEG

## 2022-08-10 LAB — BPAM RBC: Blood Product Expiration Date: 202406012359

## 2022-08-10 MED ORDER — HEPARIN SOD (PORK) LOCK FLUSH 100 UNIT/ML IV SOLN
500.0000 [IU] | Freq: Every day | INTRAVENOUS | Status: AC | PRN
  Administered 2022-08-10: 500 [IU]
  Filled 2022-08-10: qty 5

## 2022-08-10 MED ORDER — SODIUM CHLORIDE 0.9% IV SOLUTION
250.0000 mL | Freq: Once | INTRAVENOUS | Status: AC
  Administered 2022-08-10: 250 mL via INTRAVENOUS
  Filled 2022-08-10: qty 250

## 2022-08-10 MED ORDER — ACETAMINOPHEN 325 MG PO TABS
650.0000 mg | ORAL_TABLET | Freq: Once | ORAL | Status: AC
  Administered 2022-08-10: 650 mg via ORAL
  Filled 2022-08-10: qty 2

## 2022-08-10 NOTE — Patient Instructions (Signed)

## 2022-08-10 NOTE — Therapy (Signed)
OUTPATIENT SPEECH LANGUAGE PATHOLOGY TREATMENT NOTE   Patient Name: Amber Glenn MRN: [id number] DOB:1950-06-19, 72 y.o., female Today's Date: 08/10/2022  PCP: Joen Laura, MD REFERRING PROVIDER: Linus Salmons, MD   End of Session - 08/10/22 1405     Visit Number 6    Number of Visits 17    Date for SLP Re-Evaluation 09/15/22    Authorization Type [employer]    Progress Note Due on Visit 10    SLP Start Time 1400    SLP Stop Time  1445    SLP Time Calculation (min) 45 min    Activity Tolerance Patient tolerated treatment well             Past Medical History:  Diagnosis Date   Arthritis    hands and feet   Cancer associated pain    Cataract    right   Complication of anesthesia    sometimes have a hard time waking up   Diabetes mellitus    Borderline Diabetes   GERD (gastroesophageal reflux disease)    HTN (hypertension)    Hyperlipidemia    Motion sickness    Post corneal transplant    right   Past Surgical History:  Procedure Laterality Date   APPLICATION OF INTRAOPERATIVE CT SCAN  02/15/2022   Procedure: APPLICATION OF INTRAOPERATIVE CT SCAN;  Surgeon: Venetia Night, MD;  Location: [facility] ORS;  Service: Neurosurgery;;   BASAL CELL CARCINOMA EXCISION     Dr. Lorn Junes   CARPAL TUNNEL RELEASE     right   COLONOSCOPY WITH PROPOFOL N/A 12/03/2015   Procedure: COLONOSCOPY WITH PROPOFOL;  Surgeon: Scot Jun, MD;  Location: [facility] ENDOSCOPY;  Service: Endoscopy;  Laterality: N/A;   COLONOSCOPY WITH PROPOFOL N/A 02/22/2020   Procedure: COLONOSCOPY WITH PROPOFOL;  Surgeon: Regis Bill, MD;  Location: [facility] ENDOSCOPY;  Service: Endoscopy;  Laterality: N/A;   ESOPHAGOGASTRODUODENOSCOPY (EGD) WITH PROPOFOL N/A 02/22/2020   Procedure: ESOPHAGOGASTRODUODENOSCOPY (EGD) WITH PROPOFOL;  Surgeon: Regis Bill, MD;  Location: [facility] ENDOSCOPY;  Service: Endoscopy;  Laterality: N/A;   ESOPHAGOGASTRODUODENOSCOPY (EGD) WITH PROPOFOL N/A  06/07/2022   Procedure: ESOPHAGOGASTRODUODENOSCOPY (EGD) WITH PROPOFOL;  Surgeon: Regis Bill, MD;  Location: [facility] ENDOSCOPY;  Service: Endoscopy;  Laterality: N/A;   HAMMER TOE SURGERY Right 05/23/2019   Procedure: HAMMER TOE CORRECTION;  Surgeon: Gwyneth Revels, DPM;  Location: [facility];  Service: Podiatry;  Laterality: Right;  Diabetic   IR BONE TUMOR(S)RF ABLATION  04/30/2022   IR IMAGING GUIDED PORT INSERTION  04/14/2022   IR KYPHO LUMBAR INC FX REDUCE BONE BX UNI/BIL CANNULATION INC/IMAGING  04/30/2022   IR RADIOLOGIST EVAL & MGMT  03/24/2022   IR RADIOLOGIST EVAL & MGMT  06/23/2022   OSTECTOMY Right 05/23/2019   Procedure: DOUBLE OSTEOTOMY RIGHT;  Surgeon: Gwyneth Revels, DPM;  Location: [facility];  Service: Podiatry;  Laterality: Right;   SKIN CANCER EXCISION     Patient Active Problem List   Diagnosis Date Noted   Iron deficiency anemia 04/16/2022   Acute on chronic respiratory failure with hypoxia (HCC) 04/01/2022   Multifocal pneumonia 04/01/2022   Superficial thrombophlebitis 04/01/2022   Acute pulmonary embolism (HCC) 03/31/2022   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 site [facility]) 03/03/2022   Metastatic adenocarcinoma (HCC) 02/18/2022   Acute low back pain 02/17/2022   Adenocarcinoma (HCC) 02/17/2022   Delirium due to another medical condition 02/17/2022   Palliative care encounter 02/17/2022   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   Neoplasm related pain 02/11/2022   Supraclavicular adenopathy 02/11/2022   Mediastinal mass 02/11/2022   Goals of care, counseling/discussion 02/11/2022   Pathologic fracture of lumbar vertebra, initial encounter 02/11/2022   Pathologic fracture 02/10/2022   Pyuria 02/10/2022   Diarrhea    COVID-19 08/18/2019   Thumb pain 06/26/2015   Medicare annual wellness visit, subsequent 03/14/2015   Arthralgia 09/02/2014   Hot flashes 01/21/2014   Left  shoulder pain 09/20/2013   Unspecified constipation 08/30/2012   Routine general medical examination at a health care facility 05/29/2012   Screening for colon cancer 05/29/2012   Hypertension 08/18/2011   Osteoarthritis 03/20/2009   Diabetes mellitus type 2, controlled (HCC) 11/14/2008   BASAL CELL CARCINOMA, FACE 12/06/2006   Hyperlipidemia 12/06/2006   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   ALLERGIC RHINITIS 12/06/2006   GERD 12/06/2006   DEGENERATIVE JOINT DISEASE, GENERALIZED 12/06/2006    ONSET DATE 07/13/22 date of referral; 02/11/2022 onset date  REFERRING DIAG: J95.89 (ICD-10-CM) - Other postprocedural complications and disorders of respiratory system, not elsewhere classified R49.0 (ICD-10-CM) - Dysphonia  THERAPY DIAG:  Dysphagia, pharyngeal phase  Dysphonia  Rationale for Evaluation and Treatment Rehabilitation  SUBJECTIVE:   SUBJECTIVE STATEMENT: Pt pleasant, states that she has had IV hydration and blood transfusion since last session, accompanied by her daughter per SLP request Pt accompanied by: family member  PERTINENT HISTORY and DIAGNOSTIC FINDINGS:  Amber Glenn is a 72 year old female with multiple medical problems including diabetes, hypertension, hyperlipidemia, depression, metastic adenocarcinoma of the lung (diagnosed 02/2022).   Pt sustained a compression fracture of L1 s/p fixation by neurosurgery (02/10/2022).  Further testing included CT Chest Abdomen Pelvis with Contrast (02/10/2022) that revealed 1. 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 There is also mild left supraclavicular adenopathy. 2. Scattered tree-in-bud reticulonodular opacities in the lungs, right greater than left,  characteristic for atypical infectious bronchiolitis. One of the largest nodules is a 12 by 7 by 11 mm nodule in the right lateral costophrenic angle, which could be malignant or infectious. 3. Subtle abnormal hypoenhancement inferiorly in the pancreatic body measuring about 1.2 by 0.7 by 1.3 cm, along with some indistinct hypoenhancement in the pancreatic head. This could be inflammatory but infiltrative pancreatic adenocarcinoma cannot be excluded. This warrants detailed workup with either pancreatic protocol CT or MRI with and without contrast. PET-CT could also be helpful in this case. 4. As shown on lumbar spine MRI, there is a pathologic (malignant) fracture at L1 with diffuse abnormal lysis involving the vertebral body, bilateral pedicles, and bilateral transverse processes at this level with about 65% of vertebral body height and with a posterior bulging contour of the vertebral body by about 5-6 mm. Further detail on dedicated lumbar spine CT. 5. Mild acute sigmoid colon diverticulitis. Tumor in the vicinity of the local inflammation along the sigmoid colon is not totally excluded but is a less likely differential diagnostic consideration. 6. Aortic atherosclerosis.  Coronary atherosclerosis. Pt underwent an ultrasound-guided core biopsy of "hypoechoic, abnormal appearing lymph node just lateral to the left internal jugular vein measuring approximately 1.2x1.0x1.2 cm." Results positive for lung adenocarcinoma. Pt underwent Palliative radiation to spine as well as combination of chemotherapy, immunotherapy and Keytruda.   Following biopsy of  lymph node, pt began experiencing dysphagia and hoarseness. She had a Barium Swallow on 03/24/22: "Mild relative narrowing of the distal esophagus just proximal to  the gastroesophageal junction concerning for a mild stricture  restricting the passage of a 13 mm barium tablet.  Mild tertiary contractions of the esophagus as can be seen with mild spasm." EGD on 06/07/22:  "No endoscopic abnormality was evident in the esophagus to explain the patient's complaint of dysphagia." MBSS on 07/05/2022 : " multifactorial oropharyngeal and pharyngoesophageal dysphagia. Motor and sensory impairments are noted, with resulting silent aspiration of thin liquids. Anterior hyoid excursion, laryngeal closure, and epiglottic deflection are incomplete, which results in frank penetration and aspiration of thin liquid during the swallow which was unsensed. Cued cough was not effective to fully eject aspiration, and was subjectively weak. Strategies including 3 second bolus hold, as well as left and right head turn did not prevent penetration/aspiration. Pharyngeal stripping wave was diminished, and in anterior-posterior view unilateral bulging occurred (left), suggesting unilateral weakness. 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 No   FALLS: Has patient fallen in last 6 months? Yes, Number of falls: 2  LIVING ENVIRONMENT: Lives with: lives with their family Lives in: House/apartment  PLOF: Independent  PATIENT GOALS   "to drink thin liquids; being able to speak and talk plain again"  OBJECTIVE:    TODAY'S TREATMENT:  Skilled treatment session focused on pt's dysphagia and dysphonia goals. SLP facilitated session by providing the following interventions:   Pt's daughter attended session per SLP request d/t pt difficulty completing EMST and vocal cord adduction exercises. Pt appeared very frustration with dysphonia as evidenced by her facial expression and statement, "people say, i can't understand you, what is wrong with you?" Education provided on dysphonia and it relationship to vocal cord parasis as well as suspected relationship to dysphagia. Education provided that current recommendation  for pt to consume nectar thick liquids not a temporary one unless improvement observed during instrumental study (~ 4 weeks). Pt and her daughter stated "I don't always want to talk these way."   Education provided re EMST device, function and purpose. Pt required maximal verbal and written cues to slow rate of completion and to sequence steps within each repetition. with these cues, pt able to increase accuracy of each repetition. Pt able to complete 1 set of 10 repetitions with EMST set at 15 cmH2O.  Education also provided on vocal cord adduction exercises. With maximal multimodal cues for slow rate of production and for sequence of activity, pt able to complete with 50% accuracy.   PATIENT EDUCATION: Education details: see above Person educated: Patient and Child(ren) Education method: Explanation Education comprehension: verbalized understanding and needs further education   HOME EXERCISE PROGRAM: EMST 75 set at 15 cmH2O - 3 sets of 10; 3 times per day Vocal cord adduction exercises     GOALS: Goals reviewed with patient? Yes  SHORT TERM GOALS: Target date: 10 sessions  To determine optimal resistance levels for Respiratory Muscle Training (RMT) for improving increase hyolaryngeal elevation and strengthen cough for airway clearance, patient will participate in evaluation (and re-assessment as needed) of maximum expiratory pressure (MEP) and maximum inspiratory pressure (MIP) at next therapy session. Baseline: Goal status: INITIAL  2.  The patient will eliminate phonotraumatic behaviors such as chronic throat clearing,  by substituting non-traumatic methods to clear mucus. Baseline:  Goal status: INITIAL  LONG TERM GOALS: Target date: 09/15/2022  Patient will improve perception of swallowing as indicated by an improvement in EAT-10 score to 5 (Baseline = 37) by 12 weeks from initial swallowing therapy session. Baseline:  Goal status: INITIAL  2.  Patient will consume  recommended diet using strategies and compensations without overt s/sx aspiration >95% of the time. Baseline:  Goal status: INITIAL  3.  Patient will participate in objective swallowing evaluation (MBSS) to identify safest diet recommendation as well as therapeutic targets prior to discharge from services. Baseline:  Goal status: INITIAL  4.  The patient will increase hydration for an eventual goal of 6-8 glasses per day and limit caffeine intake (to maximum of 1-2, 8 oz cups/day), as measured by patient report. 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 As an additional result of her vocal fold paresis, she has confirmed silent aspiration of thin liquids on most recent Modified Barium Swallow study.  Pt's progress towards goals as been slower than expected d/t observed cognitive deficits. Hopeful that her daughter's presence in therapy with aid in carryover of HEP.   OBJECTIVE IMPAIRMENTS include voice disorder and dysphagia. These impairments are limiting patient from managing appointments, household responsibilities, ADLs/IADLs, effectively communicating at home and in community, and safety when swallowing. Factors affecting potential to achieve goals and functional outcome are severity of impairments.. Patient will benefit from skilled SLP services to address above impairments and improve overall function.  REHAB POTENTIAL: Fair left vocal cord paresis  PLAN: SLP FREQUENCY: 1-2x/week  SLP DURATION: 8 weeks  PLANNED INTERVENTIONS: Aspiration precaution training, Pharyngeal strengthening exercises, Diet toleration management , Trials of upgraded texture/liquids, SLP instruction and feedback, and Patient/family education   Daysean Tinkham B. Dreama Saa, M.S., CCC-SLP, Tree surgeon Certified Brain Injury  Specialist [facility]  [facility] Office [phone] Ascom [phone] Fax [phone]

## 2022-08-11 LAB — TYPE AND SCREEN: Unit division: 0

## 2022-08-11 LAB — BPAM RBC: Unit Type and Rh: 5100

## 2022-08-12 ENCOUNTER — Ambulatory Visit: Payer: Medicare HMO | Attending: Unknown Physician Specialty | Admitting: Speech Pathology

## 2022-08-12 DIAGNOSIS — R1312 Dysphagia, oropharyngeal phase: Secondary | ICD-10-CM | POA: Insufficient documentation

## 2022-08-12 DIAGNOSIS — R1313 Dysphagia, pharyngeal phase: Secondary | ICD-10-CM

## 2022-08-12 DIAGNOSIS — R490 Dysphonia: Secondary | ICD-10-CM

## 2022-08-12 LAB — BPAM RBC: ISSUE DATE / TIME: 202404300931

## 2022-08-12 LAB — TYPE AND SCREEN: Antibody Screen: NEGATIVE

## 2022-08-12 NOTE — Therapy (Signed)
OUTPATIENT SPEECH LANGUAGE PATHOLOGY TREATMENT NOTE   Patient Name: Amber Glenn MRN: [id number] DOB:1950-12-09, 72 y.o., female Today's Date: 08/12/2022  PCP: Amber Laura, MD REFERRING PROVIDER: Linus Salmons, MD       End of Session - 08/12/22 1500     Visit Number 7    Number of Visits 17    Date for SLP Re-Evaluation 09/15/22    Authorization Type [employer]    Progress Note Due on Visit 10    SLP Start Time 1400    SLP Stop Time  1435    SLP Time Calculation (min) 35 min    Activity Tolerance Patient tolerated treatment well             Past Medical History:  Diagnosis Date   Arthritis    hands and feet   Cancer associated pain    Cataract    right   Complication of anesthesia    sometimes have a hard time waking up   Diabetes mellitus    Borderline Diabetes   GERD (gastroesophageal reflux disease)    HTN (hypertension)    Hyperlipidemia    Motion sickness    Post corneal transplant    right   Past Surgical History:  Procedure Laterality Date   APPLICATION OF INTRAOPERATIVE CT SCAN  02/15/2022   Procedure: APPLICATION OF INTRAOPERATIVE CT SCAN;  Surgeon: Venetia Night, MD;  Location: [facility] ORS;  Service: Neurosurgery;;   BASAL CELL CARCINOMA EXCISION     Dr. Lorn Junes   CARPAL TUNNEL RELEASE     right   COLONOSCOPY WITH PROPOFOL N/A 12/03/2015   Procedure: COLONOSCOPY WITH PROPOFOL;  Surgeon: Scot Jun, MD;  Location: [facility] ENDOSCOPY;  Service: Endoscopy;  Laterality: N/A;   COLONOSCOPY WITH PROPOFOL N/A 02/22/2020   Procedure: COLONOSCOPY WITH PROPOFOL;  Surgeon: Regis Bill, MD;  Location: [facility] ENDOSCOPY;  Service: Endoscopy;  Laterality: N/A;   ESOPHAGOGASTRODUODENOSCOPY (EGD) WITH PROPOFOL N/A 02/22/2020   Procedure: ESOPHAGOGASTRODUODENOSCOPY (EGD) WITH PROPOFOL;  Surgeon: Regis Bill, MD;  Location: [facility] ENDOSCOPY;  Service: Endoscopy;  Laterality: N/A;   ESOPHAGOGASTRODUODENOSCOPY (EGD) WITH PROPOFOL N/A  06/07/2022   Procedure: ESOPHAGOGASTRODUODENOSCOPY (EGD) WITH PROPOFOL;  Surgeon: Regis Bill, MD;  Location: [facility] ENDOSCOPY;  Service: Endoscopy;  Laterality: N/A;   HAMMER TOE SURGERY Right 05/23/2019   Procedure: HAMMER TOE CORRECTION;  Surgeon: Gwyneth Revels, DPM;  Location: [facility];  Service: Podiatry;  Laterality: Right;  Diabetic   IR BONE TUMOR(S)RF ABLATION  04/30/2022   IR IMAGING GUIDED PORT INSERTION  04/14/2022   IR KYPHO LUMBAR INC FX REDUCE BONE BX UNI/BIL CANNULATION INC/IMAGING  04/30/2022   IR RADIOLOGIST EVAL & MGMT  03/24/2022   IR RADIOLOGIST EVAL & MGMT  06/23/2022   OSTECTOMY Right 05/23/2019   Procedure: DOUBLE OSTEOTOMY RIGHT;  Surgeon: Gwyneth Revels, DPM;  Location: [facility];  Service: Podiatry;  Laterality: Right;   SKIN CANCER EXCISION     Patient Active Problem List   Diagnosis Date Noted   Iron deficiency anemia 04/16/2022   Acute on chronic respiratory failure with hypoxia (HCC) 04/01/2022   Multifocal pneumonia 04/01/2022   Superficial thrombophlebitis 04/01/2022   Acute pulmonary embolism (HCC) 03/31/2022   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 site [facility]) 03/03/2022   Metastatic adenocarcinoma (HCC) 02/18/2022   Acute low back pain 02/17/2022   Adenocarcinoma (HCC) 02/17/2022   Delirium due to another medical condition 02/17/2022   Palliative care encounter 02/17/2022   Pathologic  lumbar vertebral fracture 02/11/2022   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   Neoplasm related pain 02/11/2022   Supraclavicular adenopathy 02/11/2022   Mediastinal mass 02/11/2022   Goals of care, counseling/discussion 02/11/2022   Pathologic fracture of lumbar vertebra, initial encounter 02/11/2022   Pathologic fracture 02/10/2022   Pyuria 02/10/2022   Diarrhea    COVID-19 08/18/2019   Thumb pain 06/26/2015   Medicare annual wellness visit, subsequent 03/14/2015   Arthralgia 09/02/2014   Hot flashes 01/21/2014   Left  shoulder pain 09/20/2013   Unspecified constipation 08/30/2012   Routine general medical examination at a health care facility 05/29/2012   Screening for colon cancer 05/29/2012   Hypertension 08/18/2011   Osteoarthritis 03/20/2009   Diabetes mellitus type 2, controlled (HCC) 11/14/2008   BASAL CELL CARCINOMA, FACE 12/06/2006   Hyperlipidemia 12/06/2006   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   ALLERGIC RHINITIS 12/06/2006   GERD 12/06/2006   DEGENERATIVE JOINT DISEASE, GENERALIZED 12/06/2006    ONSET DATE 07/13/22 date of referral; 02/11/2022 onset date  REFERRING DIAG: J95.89 (ICD-10-CM) - Other postprocedural complications and disorders of respiratory system, not elsewhere classified R49.0 (ICD-10-CM) - Dysphonia  THERAPY DIAG:  Dysphonia  Dysphagia, pharyngeal phase  Rationale for Evaluation and Treatment Rehabilitation  SUBJECTIVE:   SUBJECTIVE STATEMENT: Pt pleasant, states that she has had IV hydration and blood transfusion since last session, accompanied by her daughter per SLP request Pt accompanied by: family member  PERTINENT HISTORY and DIAGNOSTIC FINDINGS:  Amber Glenn is a 72 year old female with multiple medical problems including diabetes, hypertension, hyperlipidemia, depression, metastic adenocarcinoma of the lung (diagnosed 02/2022).   Pt sustained a compression fracture of L1 s/p fixation by neurosurgery (02/10/2022).  Further testing included CT Chest Abdomen Pelvis with Contrast (02/10/2022) that revealed 1. 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 There is also mild left supraclavicular adenopathy. 2. Scattered tree-in-bud reticulonodular opacities in the lungs, right greater than left,  characteristic for atypical infectious bronchiolitis. One of the largest nodules is a 12 by 7 by 11 mm nodule in the right lateral costophrenic angle, which could be malignant or infectious. 3. Subtle abnormal hypoenhancement inferiorly in the pancreatic body measuring about 1.2 by 0.7 by 1.3 cm, along with some indistinct hypoenhancement in the pancreatic head. This could be inflammatory but infiltrative pancreatic adenocarcinoma cannot be excluded. This warrants detailed workup with either pancreatic protocol CT or MRI with and without contrast. PET-CT could also be helpful in this case. 4. As shown on lumbar spine MRI, there is a pathologic (malignant) fracture at L1 with diffuse abnormal lysis involving the vertebral body, bilateral pedicles, and bilateral transverse processes at this level with about 65% of vertebral body height and with a posterior bulging contour of the vertebral body by about 5-6 mm. Further detail on dedicated lumbar spine CT. 5. Mild acute sigmoid colon diverticulitis. Tumor in the vicinity of the local inflammation along the sigmoid colon is not totally excluded but is a less likely differential diagnostic consideration. 6. Aortic atherosclerosis.  Coronary atherosclerosis. Pt underwent an ultrasound-guided core biopsy of "hypoechoic, abnormal appearing lymph node just lateral to the left internal jugular vein measuring approximately 1.2x1.0x1.2 cm." Results positive for lung adenocarcinoma. Pt underwent Palliative radiation to spine as well as combination of chemotherapy, immunotherapy and  Keytruda.   Following biopsy of lymph node, pt began experiencing dysphagia and hoarseness. She had a Barium Swallow on 03/24/22: "Mild relative narrowing of the distal esophagus just proximal to  the gastroesophageal junction concerning for a mild stricture  restricting the passage of a 13 mm barium tablet.  Mild tertiary contractions of the esophagus as can be seen with mild spasm." EGD on 06/07/22:  "No endoscopic abnormality was evident in the esophagus to explain the patient's complaint of dysphagia." MBSS on 07/05/2022 : " multifactorial oropharyngeal and pharyngoesophageal dysphagia. Motor and sensory impairments are noted, with resulting silent aspiration of thin liquids. Anterior hyoid excursion, laryngeal closure, and epiglottic deflection are incomplete, which results in frank penetration and aspiration of thin liquid during the swallow which was unsensed. Cued cough was not effective to fully eject aspiration, and was subjectively weak. Strategies including 3 second bolus hold, as well as left and right head turn did not prevent penetration/aspiration. Pharyngeal stripping wave was diminished, and in anterior-posterior view unilateral bulging occurred (left), suggesting unilateral weakness. 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 No   FALLS: Has patient fallen in last 6 months? Yes, Number of falls: 2  LIVING ENVIRONMENT: Lives with: lives with their family Lives in: House/apartment  PLOF: Independent  PATIENT GOALS   "to drink thin liquids; being able to speak and talk plain again"  OBJECTIVE:    TODAY'S TREATMENT:  Skilled treatment session focused on pt's dysphagia and dysphonia goals. SLP facilitated session by providing the following interventions:   Pt able to complete 3 sets of 10 repetitions with EMST set at 15 cmH2O given moderate cues.   Education also provided on vocal cord adduction exercises. With maximal multimodal cues for slow rate of production and for sequence of activity, pt able to complete with 75% accuracy.   PATIENT EDUCATION: Education details: see above Person educated: Patient and Child(ren) Education method: Explanation Education comprehension: verbalized  understanding and needs further education   HOME EXERCISE PROGRAM: EMST 75 set at 15 cmH2O - 3 sets of 10; 3 times per day Vocal cord adduction exercises     GOALS: Goals reviewed with patient? Yes  SHORT TERM GOALS: Target date: 10 sessions  To determine optimal resistance levels for Respiratory Muscle Training (RMT) for improving increase hyolaryngeal elevation and strengthen cough for airway clearance, patient will participate in evaluation (and re-assessment as needed) of maximum expiratory pressure (MEP) and maximum inspiratory pressure (MIP) at next therapy session. Baseline: Goal status: INITIAL  2.  The patient will eliminate phonotraumatic behaviors such as chronic throat clearing, by substituting non-traumatic methods to clear mucus. Baseline:  Goal status: INITIAL  LONG TERM GOALS: Target date: 09/15/2022  Patient will improve perception of swallowing as indicated by an improvement in EAT-10 score to 5 (Baseline = 37) by 12 weeks from initial swallowing therapy session. Baseline:  Goal status: INITIAL  2.  Patient will consume recommended diet using strategies and compensations without overt s/sx aspiration >95% of the time. Baseline:  Goal status: INITIAL  3.  Patient will participate in objective swallowing evaluation (MBSS) to identify safest diet recommendation as well as therapeutic targets prior to discharge from services. Baseline:  Goal status: INITIAL  4.  The patient will increase hydration for an eventual goal of 6-8 glasses per day and limit caffeine  intake (to maximum of 1-2, 8 oz cups/day), as measured by patient report. 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 As an additional result of her vocal fold paresis, she has confirmed silent aspiration of thin liquids on most recent  Modified Barium Swallow study.  Pt's progress towards goals as been slower than expected d/t observed cognitive deficits. Hopeful that her daughter's presence in therapy with aid in carryover of HEP.   OBJECTIVE IMPAIRMENTS include voice disorder and dysphagia. These impairments are limiting patient from managing appointments, household responsibilities, ADLs/IADLs, effectively communicating at home and in community, and safety when swallowing. Factors affecting potential to achieve goals and functional outcome are severity of impairments.. Patient will benefit from skilled SLP services to address above impairments and improve overall function.  REHAB POTENTIAL: Fair left vocal cord paresis  PLAN: SLP FREQUENCY: 1-2x/week  SLP DURATION: 8 weeks  PLANNED INTERVENTIONS: Aspiration precaution training, Pharyngeal strengthening exercises, Diet toleration management , Trials of upgraded texture/liquids, SLP instruction and feedback, and Patient/family education   Kameelah Minish B. Dreama Saa, M.S., CCC-SLP, Tree surgeon Certified Brain Injury Specialist [facility]  [facility] Rehabilitation Services Office [phone] Ascom [phone] Fax [phone]

## 2022-08-16 ENCOUNTER — Ambulatory Visit: Payer: Medicare HMO | Admitting: Speech Pathology

## 2022-08-16 DIAGNOSIS — A419 Sepsis, unspecified organism: Secondary | ICD-10-CM | POA: Diagnosis not present

## 2022-08-16 DIAGNOSIS — R1313 Dysphagia, pharyngeal phase: Secondary | ICD-10-CM

## 2022-08-16 DIAGNOSIS — R490 Dysphonia: Secondary | ICD-10-CM

## 2022-08-16 DIAGNOSIS — N390 Urinary tract infection, site not specified: Secondary | ICD-10-CM | POA: Diagnosis not present

## 2022-08-16 NOTE — Therapy (Signed)
OUTPATIENT SPEECH LANGUAGE PATHOLOGY TREATMENT NOTE   Patient Name: TANAE ROMER MRN: [id number] DOB:April 01, 1951, 72 y.o., female Today's Date: 08/16/2022  PCP: Joen Laura, MD REFERRING PROVIDER: Linus Salmons, MD       End of Session - 08/16/22 1251     Visit Number 8    Number of Visits 17    Date for SLP Re-Evaluation 09/15/22    Authorization Type [employer] H[employer]    Progress Note Due on Visit 10    SLP Start Time 1000    SLP Stop Time  1100    SLP Time Calculation (min) 60 min    Activity Tolerance Patient limited by fatigue             Past Medical History:  Diagnosis Date   Arthritis    hands and feet   Cancer associated pain    Cataract    right   Complication of anesthesia    sometimes have a hard time waking up   Diabetes mellitus    Borderline Diabetes   GERD (gastroesophageal reflux disease)    HTN (hypertension)    Hyperlipidemia    Motion sickness    Post corneal transplant    right   Past Surgical History:  Procedure Laterality Date   APPLICATION OF INTRAOPERATIVE CT SCAN  02/15/2022   Procedure: APPLICATION OF INTRAOPERATIVE CT SCAN;  Surgeon: Venetia Night, MD;  Location: [facility] ORS;  Service: Neurosurgery;;   BASAL CELL CARCINOMA EXCISION     Dr. Lorn Junes   CARPAL TUNNEL RELEASE     right   COLONOSCOPY WITH PROPOFOL N/A 12/03/2015   Procedure: COLONOSCOPY WITH PROPOFOL;  Surgeon: Scot Jun, MD;  Location: [facility] ENDOSCOPY;  Service: Endoscopy;  Laterality: N/A;   COLONOSCOPY WITH PROPOFOL N/A 02/22/2020   Procedure: COLONOSCOPY WITH PROPOFOL;  Surgeon: Regis Bill, MD;  Location: [facility] ENDOSCOPY;  Service: Endoscopy;  Laterality: N/A;   ESOPHAGOGASTRODUODENOSCOPY (EGD) WITH PROPOFOL N/A 02/22/2020   Procedure: ESOPHAGOGASTRODUODENOSCOPY (EGD) WITH PROPOFOL;  Surgeon: Regis Bill, MD;  Location: [facility] ENDOSCOPY;  Service: Endoscopy;  Laterality: N/A;   ESOPHAGOGASTRODUODENOSCOPY (EGD) WITH PROPOFOL N/A  06/07/2022   Procedure: ESOPHAGOGASTRODUODENOSCOPY (EGD) WITH PROPOFOL;  Surgeon: Regis Bill, MD;  Location: [facility] ENDOSCOPY;  Service: Endoscopy;  Laterality: N/A;   HAMMER TOE SURGERY Right 05/23/2019   Procedure: HAMMER TOE CORRECTION;  Surgeon: Gwyneth Revels, DPM;  Location: [facility];  Service: Podiatry;  Laterality: Right;  Diabetic   IR BONE TUMOR(S)RF ABLATION  04/30/2022   IR IMAGING GUIDED PORT INSERTION  04/14/2022   IR KYPHO LUMBAR INC FX REDUCE BONE BX UNI/BIL CANNULATION INC/IMAGING  04/30/2022   IR RADIOLOGIST EVAL & MGMT  03/24/2022   IR RADIOLOGIST EVAL & MGMT  06/23/2022   OSTECTOMY Right 05/23/2019   Procedure: DOUBLE OSTEOTOMY RIGHT;  Surgeon: Gwyneth Revels, DPM;  Location: [facility];  Service: Podiatry;  Laterality: Right;   SKIN CANCER EXCISION     Patient Active Problem List   Diagnosis Date Noted   Iron deficiency anemia 04/16/2022   Acute on chronic respiratory failure with hypoxia (HCC) 04/01/2022   Multifocal pneumonia 04/01/2022   Superficial thrombophlebitis 04/01/2022   Acute pulmonary embolism (HCC) 03/31/2022   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 site [facility]) 03/03/2022   Metastatic adenocarcinoma (HCC) 02/18/2022   Acute low back pain 02/17/2022   Adenocarcinoma (HCC) 02/17/2022   Delirium due to another medical condition 02/17/2022   Palliative care encounter 02/17/2022   Pathologic  lumbar vertebral fracture 02/11/2022   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   Neoplasm related pain 02/11/2022   Supraclavicular adenopathy 02/11/2022   Mediastinal mass 02/11/2022   Goals of care, counseling/discussion 02/11/2022   Pathologic fracture of lumbar vertebra, initial encounter 02/11/2022   Pathologic fracture 02/10/2022   Pyuria 02/10/2022   Diarrhea    COVID-19 08/18/2019   Thumb pain 06/26/2015   Medicare annual wellness visit, subsequent 03/14/2015   Arthralgia 09/02/2014   Hot flashes 01/21/2014   Left  shoulder pain 09/20/2013   Unspecified constipation 08/30/2012   Routine general medical examination at a health care facility 05/29/2012   Screening for colon cancer 05/29/2012   Hypertension 08/18/2011   Osteoarthritis 03/20/2009   Diabetes mellitus type 2, controlled (HCC) 11/14/2008   BASAL CELL CARCINOMA, FACE 12/06/2006   Hyperlipidemia 12/06/2006   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   ALLERGIC RHINITIS 12/06/2006   GERD 12/06/2006   DEGENERATIVE JOINT DISEASE, GENERALIZED 12/06/2006    ONSET DATE 07/13/22 date of referral; 02/11/2022 onset date  REFERRING DIAG: J95.89 (ICD-10-CM) - Other postprocedural complications and disorders of respiratory system, not elsewhere classified R49.0 (ICD-10-CM) - Dysphonia  THERAPY DIAG:  Dysphonia  Dysphagia, pharyngeal phase  Rationale for Evaluation and Treatment Rehabilitation  SUBJECTIVE:   SUBJECTIVE STATEMENT: Pt appeared fatigued Pt accompanied by: family member  PERTINENT HISTORY and DIAGNOSTIC FINDINGS:  Sherie Olman is a 72 year old female with multiple medical problems including diabetes, hypertension, hyperlipidemia, depression, metastic adenocarcinoma of the lung (diagnosed 02/2022).   Pt sustained a compression fracture of L1 s/p fixation by neurosurgery (02/10/2022).  Further testing included CT Chest Abdomen Pelvis with Contrast (02/10/2022) that revealed 1. 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 There is also mild left supraclavicular adenopathy. 2. Scattered tree-in-bud reticulonodular opacities in the lungs, right greater than left, characteristic for atypical infectious bronchiolitis. One of the largest nodules is a 12 by 7 by 11 mm nodule in the  right lateral costophrenic angle, which could be malignant or infectious. 3. Subtle abnormal hypoenhancement inferiorly in the pancreatic body measuring about 1.2 by 0.7 by 1.3 cm, along with some indistinct hypoenhancement in the pancreatic head. This could be inflammatory but infiltrative pancreatic adenocarcinoma cannot be excluded. This warrants detailed workup with either pancreatic protocol CT or MRI with and without contrast. PET-CT could also be helpful in this case. 4. As shown on lumbar spine MRI, there is a pathologic (malignant) fracture at L1 with diffuse abnormal lysis involving the vertebral body, bilateral pedicles, and bilateral transverse processes at this level with about 65% of vertebral body height and with a posterior bulging contour of the vertebral body by about 5-6 mm. Further detail on dedicated lumbar spine CT. 5. Mild acute sigmoid colon diverticulitis. Tumor in the vicinity of the local inflammation along the sigmoid colon is not totally excluded but is a less likely differential diagnostic consideration. 6. Aortic atherosclerosis.  Coronary atherosclerosis. Pt underwent an ultrasound-guided core biopsy of "hypoechoic, abnormal appearing lymph node just lateral to the left internal jugular vein measuring approximately 1.2x1.0x1.2 cm." Results positive for lung adenocarcinoma. Pt underwent Palliative radiation to spine as well as combination of chemotherapy, immunotherapy and Keytruda.   Following biopsy of lymph node, pt began experiencing dysphagia and hoarseness. She had a Barium Swallow  on 03/24/22: "Mild relative narrowing of the distal esophagus just proximal to  the gastroesophageal junction concerning for a mild stricture  restricting the passage of a 13 mm barium tablet.  Mild tertiary contractions of the esophagus as can be seen with mild spasm." EGD on 06/07/22: "No endoscopic abnormality was evident in the esophagus to explain the patient's complaint of dysphagia." MBSS on  07/05/2022 : " multifactorial oropharyngeal and pharyngoesophageal dysphagia. Motor and sensory impairments are noted, with resulting silent aspiration of thin liquids. Anterior hyoid excursion, laryngeal closure, and epiglottic deflection are incomplete, which results in frank penetration and aspiration of thin liquid during the swallow which was unsensed. Cued cough was not effective to fully eject aspiration, and was subjectively weak. Strategies including 3 second bolus hold, as well as left and right head turn did not prevent penetration/aspiration. Pharyngeal stripping wave was diminished, and in anterior-posterior view unilateral bulging occurred (left), suggesting unilateral weakness. 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 No   FALLS: Has patient fallen in last 6 months? Yes, Number of falls: 2  LIVING ENVIRONMENT: Lives with: lives with their family Lives in: House/apartment  PLOF: Independent  PATIENT GOALS   "to drink thin liquids; being able to speak and talk plain again"  OBJECTIVE:    TODAY'S TREATMENT:  Skilled treatment session focused on pt's dysphagia and dysphonia goals. SLP facilitated session by providing the following interventions:   Pt able to complete 3 sets of 10 repetitions with EMST set at 15 cmH2O given maximal cues with report of increased effort d/t fatigue  Education also provided on vocal cord adduction exercises. With maximal multimodal cues for slow rate of production and for sequence of activity, pt able to complete with 50% accuracy.   PATIENT EDUCATION: Education details: see above Person educated: Patient and Child(ren) Education method: Explanation Education comprehension: verbalized understanding and needs further education   HOME EXERCISE PROGRAM: EMST  75 set at 15 cmH2O - 3 sets of 10; 3 times per day Vocal cord adduction exercises     GOALS: Goals reviewed with patient? Yes  SHORT TERM GOALS: Target date: 10 sessions  To determine optimal resistance levels for Respiratory Muscle Training (RMT) for improving increase hyolaryngeal elevation and strengthen cough for airway clearance, patient will participate in evaluation (and re-assessment as needed) of maximum expiratory pressure (MEP) and maximum inspiratory pressure (MIP) at next therapy session. Baseline: Goal status: INITIAL  2.  The patient will eliminate phonotraumatic behaviors such as chronic throat clearing, by substituting non-traumatic methods to clear mucus. Baseline:  Goal status: INITIAL  LONG TERM GOALS: Target date: 09/15/2022  Patient will improve perception of swallowing as indicated by an improvement in EAT-10 score to 5 (Baseline = 37) by 12 weeks from initial swallowing therapy session. Baseline:  Goal status: INITIAL  2.  Patient will consume recommended diet using strategies and compensations without overt s/sx aspiration >95% of the time. Baseline:  Goal status: INITIAL  3.  Patient will participate in objective swallowing evaluation (MBSS) to identify safest diet recommendation as well as therapeutic targets prior to discharge from services. Baseline:  Goal status: INITIAL  4.  The patient will increase hydration for an eventual goal of 6-8 glasses per day and limit caffeine intake (to maximum of 1-2, 8 oz cups/day), as measured by patient report.  Baseline:  Goal status: INITIAL    ASSESSMENT:  CLINICAL IMPRESSION: Pt appeared very fatigued with noted increased difficulty completing tasks. In an effort to conserve pt's energy while undergoing chemo, recommend pt be placed on hold for 2 weeks with her daughter helping her complete HEP accurately.   OBJECTIVE IMPAIRMENTS include voice disorder and dysphagia. These impairments are limiting patient  from managing appointments, household responsibilities, ADLs/IADLs, effectively communicating at home and in community, and safety when swallowing. Factors affecting potential to achieve goals and functional outcome are severity of impairments.. Patient will benefit from skilled SLP services to address above impairments and improve overall function.  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 Dreama Saa, M.S., CCC-SLP, Tree surgeon Certified Brain Injury Specialist [facility]  [facility] Office [phone] Ascom [phone] Fax [phone]

## 2022-08-17 ENCOUNTER — Emergency Department: Payer: Medicare HMO

## 2022-08-17 ENCOUNTER — Other Ambulatory Visit: Payer: Self-pay

## 2022-08-17 ENCOUNTER — Ambulatory Visit: Payer: Medicare HMO | Admitting: Speech Pathology

## 2022-08-17 ENCOUNTER — Observation Stay: Payer: Medicare HMO

## 2022-08-17 ENCOUNTER — Inpatient Hospital Stay
Admission: EM | Admit: 2022-08-17 | Discharge: 2022-08-19 | DRG: 871 | Disposition: A | Discharge status: Home / Self Care | Payer: Medicare HMO | Attending: Student | Admitting: Student

## 2022-08-17 ENCOUNTER — Encounter: Payer: Self-pay | Admitting: Emergency Medicine

## 2022-08-17 ENCOUNTER — Inpatient Hospital Stay: Payer: Medicare HMO | Attending: Oncology | Admitting: Hospice and Palliative Medicine

## 2022-08-17 DIAGNOSIS — Z9189 Other specified personal risk factors, not elsewhere classified: Secondary | ICD-10-CM

## 2022-08-17 DIAGNOSIS — Z85118 Personal history of other malignant neoplasm of bronchus and lung: Secondary | ICD-10-CM

## 2022-08-17 DIAGNOSIS — K5720 Diverticulitis of large intestine with perforation and abscess without bleeding: Secondary | ICD-10-CM | POA: Diagnosis present

## 2022-08-17 DIAGNOSIS — Z8249 Family history of ischemic heart disease and other diseases of the circulatory system: Secondary | ICD-10-CM

## 2022-08-17 DIAGNOSIS — Z85828 Personal history of other malignant neoplasm of skin: Secondary | ICD-10-CM

## 2022-08-17 DIAGNOSIS — B965 Pseudomonas (aeruginosa) (mallei) (pseudomallei) as the cause of diseases classified elsewhere: Secondary | ICD-10-CM | POA: Diagnosis present

## 2022-08-17 DIAGNOSIS — C3491 Malignant neoplasm of unspecified part of right bronchus or lung: Secondary | ICD-10-CM | POA: Insufficient documentation

## 2022-08-17 DIAGNOSIS — C7951 Secondary malignant neoplasm of bone: Secondary | ICD-10-CM | POA: Diagnosis present

## 2022-08-17 DIAGNOSIS — Z86711 Personal history of pulmonary embolism: Secondary | ICD-10-CM | POA: Insufficient documentation

## 2022-08-17 DIAGNOSIS — Z5112 Encounter for antineoplastic immunotherapy: Secondary | ICD-10-CM | POA: Insufficient documentation

## 2022-08-17 DIAGNOSIS — N179 Acute kidney failure, unspecified: Secondary | ICD-10-CM | POA: Diagnosis present

## 2022-08-17 DIAGNOSIS — D72829 Elevated white blood cell count, unspecified: Secondary | ICD-10-CM

## 2022-08-17 DIAGNOSIS — Z803 Family history of malignant neoplasm of breast: Secondary | ICD-10-CM

## 2022-08-17 DIAGNOSIS — W19XXXA Unspecified fall, initial encounter: Secondary | ICD-10-CM

## 2022-08-17 DIAGNOSIS — K5792 Diverticulitis of intestine, part unspecified, without perforation or abscess without bleeding: Secondary | ICD-10-CM | POA: Diagnosis not present

## 2022-08-17 DIAGNOSIS — Z79899 Other long term (current) drug therapy: Secondary | ICD-10-CM

## 2022-08-17 DIAGNOSIS — Z7984 Long term (current) use of oral hypoglycemic drugs: Secondary | ICD-10-CM

## 2022-08-17 DIAGNOSIS — Z87891 Personal history of nicotine dependence: Secondary | ICD-10-CM

## 2022-08-17 DIAGNOSIS — Z923 Personal history of irradiation: Secondary | ICD-10-CM

## 2022-08-17 DIAGNOSIS — N390 Urinary tract infection, site not specified: Secondary | ICD-10-CM

## 2022-08-17 DIAGNOSIS — K219 Gastro-esophageal reflux disease without esophagitis: Secondary | ICD-10-CM | POA: Diagnosis present

## 2022-08-17 DIAGNOSIS — Z7901 Long term (current) use of anticoagulants: Secondary | ICD-10-CM

## 2022-08-17 DIAGNOSIS — J9811 Atelectasis: Secondary | ICD-10-CM | POA: Diagnosis present

## 2022-08-17 DIAGNOSIS — C770 Secondary and unspecified malignant neoplasm of lymph nodes of head, face and neck: Secondary | ICD-10-CM | POA: Insufficient documentation

## 2022-08-17 DIAGNOSIS — J189 Pneumonia, unspecified organism: Secondary | ICD-10-CM | POA: Diagnosis present

## 2022-08-17 DIAGNOSIS — C3490 Malignant neoplasm of unspecified part of unspecified bronchus or lung: Secondary | ICD-10-CM | POA: Diagnosis present

## 2022-08-17 DIAGNOSIS — W109XXA Fall (on) (from) unspecified stairs and steps, initial encounter: Secondary | ICD-10-CM | POA: Diagnosis present

## 2022-08-17 DIAGNOSIS — Z947 Corneal transplant status: Secondary | ICD-10-CM

## 2022-08-17 DIAGNOSIS — I129 Hypertensive chronic kidney disease with stage 1 through stage 4 chronic kidney disease, or unspecified chronic kidney disease: Secondary | ICD-10-CM | POA: Diagnosis present

## 2022-08-17 DIAGNOSIS — R4182 Altered mental status, unspecified: Secondary | ICD-10-CM

## 2022-08-17 DIAGNOSIS — E785 Hyperlipidemia, unspecified: Secondary | ICD-10-CM | POA: Diagnosis present

## 2022-08-17 DIAGNOSIS — A419 Sepsis, unspecified organism: Secondary | ICD-10-CM

## 2022-08-17 DIAGNOSIS — I2699 Other pulmonary embolism without acute cor pulmonale: Secondary | ICD-10-CM

## 2022-08-17 DIAGNOSIS — N1831 Chronic kidney disease, stage 3a: Secondary | ICD-10-CM | POA: Diagnosis present

## 2022-08-17 DIAGNOSIS — Y92009 Unspecified place in unspecified non-institutional (private) residence as the place of occurrence of the external cause: Secondary | ICD-10-CM

## 2022-08-17 DIAGNOSIS — G893 Neoplasm related pain (acute) (chronic): Secondary | ICD-10-CM | POA: Insufficient documentation

## 2022-08-17 DIAGNOSIS — E1122 Type 2 diabetes mellitus with diabetic chronic kidney disease: Secondary | ICD-10-CM | POA: Diagnosis present

## 2022-08-17 DIAGNOSIS — Z79620 Long term (current) use of immunosuppressive biologic: Secondary | ICD-10-CM | POA: Insufficient documentation

## 2022-08-17 HISTORY — DX: Malignant neoplasm of unspecified part of unspecified bronchus or lung: C34.90

## 2022-08-17 LAB — LACTIC ACID, PLASMA: Lactic Acid, Venous: 0.8 mmol/L (ref 0.5–1.9)

## 2022-08-17 LAB — URINALYSIS, ROUTINE W REFLEX MICROSCOPIC
Bilirubin Urine: NEGATIVE
Glucose, UA: NEGATIVE mg/dL
Ketones, ur: NEGATIVE mg/dL
Nitrite: NEGATIVE
Protein, ur: 100 mg/dL — AB
Specific Gravity, Urine: 1.011 (ref 1.005–1.030)
Squamous Epithelial / HPF: NONE SEEN /HPF (ref 0–5)
WBC, UA: >50 WBC/hpf (ref 0–5)
pH: 6 (ref 5.0–8.0)

## 2022-08-17 LAB — CBC
HCT: 33.3 % — ABNORMAL LOW (ref 36.0–46.0)
Hemoglobin: 10.2 g/dL — ABNORMAL LOW (ref 12.0–15.0)
MCH: 30.5 pg (ref 26.0–34.0)
MCHC: 30.6 g/dL (ref 30.0–36.0)
MCV: 99.7 fL (ref 80.0–100.0)
Platelets: 275 10*3/uL (ref 150–400)
RBC: 3.34 MIL/uL — ABNORMAL LOW (ref 3.87–5.11)
RDW: 19.1 % — ABNORMAL HIGH (ref 11.5–15.5)
WBC: 16 10*3/uL — ABNORMAL HIGH (ref 4.0–10.5)
nRBC: 0 % (ref 0.0–0.2)

## 2022-08-17 LAB — COMPREHENSIVE METABOLIC PANEL
ALT: 19 U/L (ref 0–44)
AST: 29 U/L (ref 15–41)
Albumin: 2.9 g/dL — ABNORMAL LOW (ref 3.5–5.0)
Alkaline Phosphatase: 92 U/L (ref 38–126)
Anion gap: 11 (ref 5–15)
BUN: 46 mg/dL — ABNORMAL HIGH (ref 8–23)
CO2: 22 mmol/L (ref 22–32)
Calcium: 8.8 mg/dL — ABNORMAL LOW (ref 8.9–10.3)
Chloride: 110 mmol/L (ref 98–111)
Creatinine, Ser: 2.18 mg/dL — ABNORMAL HIGH (ref 0.44–1.00)
GFR, Estimated: 23 mL/min — ABNORMAL LOW (ref >60)
Glucose, Bld: 139 mg/dL — ABNORMAL HIGH (ref 70–99)
Potassium: 4.4 mmol/L (ref 3.5–5.1)
Sodium: 143 mmol/L (ref 135–145)
Total Bilirubin: 0.4 mg/dL (ref 0.3–1.2)
Total Protein: 7 g/dL (ref 6.5–8.1)

## 2022-08-17 LAB — CREATININE, SERUM
Creatinine, Ser: 2.25 mg/dL — ABNORMAL HIGH (ref 0.44–1.00)
GFR, Estimated: 22 mL/min — ABNORMAL LOW (ref >60)

## 2022-08-17 LAB — LIPASE, BLOOD: Lipase: 21 U/L (ref 11–51)

## 2022-08-17 LAB — TROPONIN I (HIGH SENSITIVITY): Troponin I (High Sensitivity): 12 ng/L (ref <18)

## 2022-08-17 MED ORDER — SODIUM CHLORIDE 0.9 % IV BOLUS
1000.0000 mL | Freq: Once | INTRAVENOUS | Status: AC
  Administered 2022-08-17: 1000 mL via INTRAVENOUS

## 2022-08-17 MED ORDER — ACETAMINOPHEN 650 MG RE SUPP: 650.0000 mg | Freq: Four times a day (QID) | RECTAL | Status: DC | PRN

## 2022-08-17 MED ORDER — LACTATED RINGERS IV SOLN: INTRAVENOUS | Status: AC

## 2022-08-17 MED ORDER — SODIUM CHLORIDE 0.9 % IV SOLN
500.0000 mg | INTRAVENOUS | Status: DC
  Administered 2022-08-17 – 2022-08-18 (×2): 500 mg via INTRAVENOUS
  Filled 2022-08-17 (×3): qty 5

## 2022-08-17 MED ORDER — IOHEXOL 9 MG/ML PO SOLN
500.0000 mL | ORAL | Status: AC
  Administered 2022-08-17: 500 mL via ORAL
  Filled 2022-08-17 (×2): qty 500

## 2022-08-17 MED ORDER — SODIUM CHLORIDE 0.9 % IV SOLN: 1.0000 g | INTRAVENOUS | Status: DC

## 2022-08-17 MED ORDER — ENOXAPARIN SODIUM 30 MG/0.3ML IJ SOSY
30.0000 mg | PREFILLED_SYRINGE | INTRAMUSCULAR | Status: DC
  Filled 2022-08-17: qty 0.3

## 2022-08-17 MED ORDER — SODIUM CHLORIDE 0.9 % IV SOLN
3.0000 g | Freq: Two times a day (BID) | INTRAVENOUS | Status: DC
  Administered 2022-08-18: 3 g via INTRAVENOUS
  Filled 2022-08-17: qty 8
  Filled 2022-08-17: qty 3

## 2022-08-17 MED ORDER — SODIUM CHLORIDE 0.9 % IV SOLN
2.0000 g | Freq: Once | INTRAVENOUS | Status: AC
  Administered 2022-08-17: 2 g via INTRAVENOUS
  Filled 2022-08-17: qty 20

## 2022-08-17 MED ORDER — SODIUM CHLORIDE 0.9% FLUSH
3.0000 mL | Freq: Two times a day (BID) | INTRAVENOUS | Status: DC
  Administered 2022-08-18 – 2022-08-19 (×3): 3 mL via INTRAVENOUS

## 2022-08-17 MED ORDER — ACETAMINOPHEN 325 MG PO TABS: 650.0000 mg | ORAL_TABLET | Freq: Four times a day (QID) | ORAL | Status: DC | PRN

## 2022-08-17 NOTE — Assessment & Plan Note (Signed)
Note, developing over 5 days.  Likely metabolic given patient has marked leukocytosis tachycardia and suspected infection.  However I am concerned about patient standing opiate use, will reduce this and obviously keep as needed opiates handy for patient pain control.  At this time patient does not report any uncontrolled pain.  Therefore I feel comfortable reducing the standing opiates.  If poor response to above measures, will consider MRI brain at that time

## 2022-08-17 NOTE — ED Notes (Signed)
Report given to Alex, RN

## 2022-08-17 NOTE — Sepsis Progress Note (Signed)
Notified provider of need to order lactic acid and repeat lactic acid.  °

## 2022-08-17 NOTE — Consult Note (Signed)
Pharmacy Antibiotic Note  Amber Glenn is a 72 y.o. female admitted on 08/17/2022 with progressive somnolence and disinterest in activities around her; Occasional vomiting. Recurent aspirations events. Patient with PMH of Lung CA mets to bones s/p radiation. Curetly being maintained on chemo therapy. C/f aspiration PNA Pharmacy has been consulted for Unasyn dosing. Patient already received dose of ceftriaxone x 1 in ED  Plan: Initiate Unasyn 3 gram Q12H  Weight: 53.1 kg (117 lb)  Temp (24hrs), Avg:99.3 F (37.4 C), Min:98.8 F (37.1 C), Max:99.8 F (37.7 C)  Recent Labs  Lab 08/17/22 1446 08/17/22 1747  WBC 16.0*  --   CREATININE 2.18*  --   LATICACIDVEN  --  0.8    Estimated Creatinine Clearance: 16.5 mL/min (A) (by C-G formula based on SCr of 2.18 mg/dL (H)).    No Known Allergies  Antimicrobials this admission: 5/7 Ceftriaxone x 1   5/7 azithromycin >>  5/8 Unasyn >>  Dose adjustments this admission:   Microbiology results: 5/7 BCx: pending 5/7 UCx: pending    Thank you for allowing pharmacy to be a part of this patient's care.  Sharen Hones, PharmD, BCPS Clinical Pharmacist   08/17/2022 9:14 PM

## 2022-08-17 NOTE — Progress Notes (Signed)
I called and spoke with patient's daughter.  Reportedly, patient was found on the floor this morning by daughter.  Patient has been confused and reports possibly having hit her head.  Daughter advised to take the patient to emergency department for evaluation.

## 2022-08-17 NOTE — ED Triage Notes (Signed)
Patient to ED via POV for fall. Patient states she fell walking to the restroom and hit head. Denies LOC, on blood thinners. Currently on chemo for lung cancer- last session 3 weeks ago. Patient more altered than normal per family. Answers all questions appropriately at this time.

## 2022-08-17 NOTE — Sepsis Progress Note (Signed)
Elink monitoring for the code sepsis protocol.  

## 2022-08-17 NOTE — Assessment & Plan Note (Signed)
Concern for UTI and aspiration pneumonia.  Treating with Unasyn and azithromycin.  Cultures have been ordered

## 2022-08-17 NOTE — Assessment & Plan Note (Signed)
Continue with mechanically altered diet

## 2022-08-17 NOTE — Assessment & Plan Note (Signed)
This is a chronic diagnosis, at this time I will I will hold patient's apixaban until CT abdomen is done

## 2022-08-17 NOTE — Assessment & Plan Note (Signed)
CT abdomen to rule out hydronephrosis.  IV fluids, check urine sodium and creatinine.  Monitor response to IV fluids given patient has been vomiting and having very poor p.o. intake

## 2022-08-17 NOTE — Consult Note (Signed)
CODE SEPSIS - PHARMACY COMMUNICATION  **Broad Spectrum Antibiotics should be administered within 1 hour of Sepsis diagnosis**  Time Code Sepsis Called/Page Received: 1736  Antibiotics Ordered: ceftriaxone  Time of 1st antibiotic administration: 1714  Additional action taken by pharmacy: N/A  Barrie Folk ,PharmD Clinical Pharmacist  08/17/2022  5:58 PM

## 2022-08-17 NOTE — H&P (Addendum)
History and Physical    Patient: Amber Glenn WGN:[id number] DOB: October 01, 1950 DOA: 08/17/2022 DOS: the patient was seen and examined on 08/17/2022 PCP: Dortha Kern, MD  Patient coming from: Home  Chief Complaint:  Chief Complaint  Patient presents with   Fall   HPI: History from record review, ER signout as well as patient grandson at the bedside.   Amber Glenn is a 72 y.o. 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 Curetly being maintained on chemo therapy.  Patient baseline status for several weeks, per patient has been limited PO intake. Occasional vomiting. Recurent aspirations events. Fatigue. Some sob and cough chronically. She has chronic dysphonia with several SLP evaluations.  It seems over the last 5 days patient has developed progressive somnolence and disinterest in activities around her.  Patient reports that today she was getting up and walking around the house and unfortunately she missed her own step and fell down.  Patient does not provide further details of the fall. Patient's daughter reports that she found patient in the hallway and she was sitting upright on her bottom . Patient was brought to ER.  Daughter attempted to be reached on phone but was a game and conversation was not possible.  Patient currently offers no complaints. See physical exam for mentation  Review of Systems: unable to review all systems due to the inability of the patient to answer questions. Past Medical History:  Diagnosis Date   Arthritis    hands and feet   Cancer associated pain    Cataract    right   Complication of anesthesia    sometimes have a hard time waking up   Diabetes mellitus    Borderline Diabetes   GERD (gastroesophageal reflux disease)    HTN (hypertension)    Hyperlipidemia    Motion sickness    Post corneal transplant    right   Past Surgical History:  Procedure Laterality Date   APPLICATION OF INTRAOPERATIVE CT SCAN   02/15/2022   Procedure: APPLICATION OF INTRAOPERATIVE CT SCAN;  Surgeon: Venetia Night, MD;  Location: [facility] ORS;  Service: Neurosurgery;;   BASAL CELL CARCINOMA EXCISION     Dr. Lorn Junes   CARPAL TUNNEL RELEASE     right   COLONOSCOPY WITH PROPOFOL N/A 12/03/2015   Procedure: COLONOSCOPY WITH PROPOFOL;  Surgeon: Scot Jun, MD;  Location: [facility];  Service: Endoscopy;  Laterality: N/A;   COLONOSCOPY WITH PROPOFOL N/A 02/22/2020   Procedure: COLONOSCOPY WITH PROPOFOL;  Surgeon: Regis Bill, MD;  Location: [facility] END[facility]COPY;  Service: Endoscopy;  Laterality: N/A;   ESOPHAGOGASTRODUODENOSCOPY (EGD) WITH PROPOFOL N/A 02/22/2020   Procedure: ESOPHAGOGASTRODUODENOSCOPY (EGD) WITH PROPOFOL;  Surgeon: Regis Bill, MD;  Location: [facility] END[facility]COPY;  Service: Endoscopy;  Laterality: N/A;   ESOPHAGOGASTRODUODENOSCOPY (EGD) WITH PROPOFOL N/A 06/07/2022   Procedure: ESOPHAGOGASTRODUODENOSCOPY (EGD) WITH PROPOFOL;  Surgeon: Regis Bill, MD;  Location: [facility] ENDOSCOPY;  Service: Endoscopy;  Laterality: N/A;   HAMMER TOE SURGERY Right 05/23/2019   Procedure: HAMMER TOE CORRECTION;  Surgeon: Gwyneth Revels, DPM;  Location: [facility];  Service: Podiatry;  Laterality: Right;  Diabetic   IR BONE TUMOR(S)RF ABLATION  04/30/2022   IR IMAGING GUIDED PORT INSERTION  04/14/2022   IR KYPHO LUMBAR INC FX REDUCE BONE BX UNI/BIL CANNULATION INC/IMAGING  04/30/2022   IR RADIOLOGIST EVAL & MGMT  03/24/2022   IR RADIOLOGIST EVAL & MGMT  06/23/2022   OSTECTOMY Right 05/23/2019  Procedure: DOUBLE OSTEOTOMY RIGHT;  Surgeon: Gwyneth Revels, DPM;  Location: [facility];  Service: Podiatry;  Laterality: Right;   SKIN CANCER EXCISION     Social History:  reports that she quit smoking about 13 years ago. Her smoking use included cigarettes. She has never used smokeless tobacco. She reports that she does not drink alcohol and does not use drugs.  No Known Allergies  Family History   Problem Relation Age of Onset   Heart disease Mother    Heart disease Father    Heart disease Maternal Grandmother    Heart disease Maternal Grandfather    Breast cancer Sister 1    Prior to Admission medications   Medication Sig Start Date End Date Taking? Authorizing Provider  acetaminophen (TYLENOL) 500 MG tablet Take 500 mg by mouth every 6 (six) hours as needed for moderate pain, mild pain, fever or headache.    [provider]  amoxicillin-clavulanate (AUGMENTIN) 875-125 MG tablet Take 1 tablet by mouth 2 (two) times daily. 07/27/22   Creig Hines, MD  Ascorbic Acid (VITAMIN C) 1000 MG tablet Take 1,000 mg by mouth daily.      [provider]  carvedilol (COREG) 3.125 MG tablet Take 3.125 mg by mouth 2 (two) times daily with a meal.    [provider]  cyclobenzaprine (FLEXERIL) 5 MG tablet Take 1 tablet (5 mg total) by mouth 3 (three) times daily as needed for muscle spasms. 05/11/22   Borders, Daryl Eastern, NP  dexamethasone (DECADRON) 4 MG tablet TAKE 1 TABLET 2 TIMES DAILY STARTING DAY BEFORE PEMETREXED. THEN TAKE 2 TABS DAILY FOR 3 DAYS STARTING DAY AFTER CARBOPLATIN. TAKE WITH FOOD. 06/07/22   Creig Hines, MD  Docusate Calcium (STOOL SOFTENER PO) Take 1 capsule by mouth daily as needed (constipation).    [provider]  ELIQUIS 5 MG TABS tablet TAKE 1 TABLET BY MOUTH TWICE A DAY 07/19/22   Creig Hines, MD  folic acid (FOLVITE) 1 MG tablet Take 1 tablet (1 mg total) by mouth daily. Start 7 days before pemetrexed chemotherapy. Continue until 21 days after pemetrexed completed. 06/30/22   Creig Hines, MD  gabapentin (NEURONTIN) 300 MG capsule Take 1 capsule (300 mg total) by mouth 3 (three) times daily. Patient taking differently: Take 100 mg by mouth 2 (two) times daily. 03/23/22   Borders, Daryl Eastern, NP  lidocaine-prilocaine (EMLA) cream Apply 1 Application topically as needed. 08/06/22   Borders, Daryl Eastern, NP  LORazepam (ATIVAN) 0.5 MG tablet  Take 1 tablet (0.5 mg total) by mouth every 8 (eight) hours as needed for anxiety. 03/30/22   Borders, Daryl Eastern, NP  losartan (COZAAR) 100 MG tablet Take 100 mg by mouth daily. Patient not taking: Reported on 08/17/2022    [provider]  metFORMIN (GLUCOPHAGE) 500 MG tablet Take by mouth 2 (two) times daily with a meal.    [provider]  metoprolol tartrate (LOPRESSOR) 25 MG tablet Take 1 tablet (25 mg total) by mouth 2 (two) times daily. 04/01/22   Alford Highland, MD  Multiple Vitamin (MULTIVITAMIN) capsule Take 1 capsule by mouth daily.      [provider]  OLANZapine (ZYPREXA) 10 MG tablet TAKE 1 TABLET BY MOUTH EVERYDAY AT BEDTIME 07/26/22   Creig Hines, MD  omeprazole (PRILOSEC) 20 MG capsule Take 1 capsule (20 mg total) by mouth daily. 12/20/14   Shelia Media, MD  ondansetron (ZOFRAN) 8 MG tablet Take 1 tablet (  8 mg total) by mouth every 8 (eight) hours as needed for nausea or vomiting. Start on the third day after carboplatin. 08/06/22   Borders, Daryl Eastern, NP  oxyCODONE ER (XTAMPZA ER) 18 MG C12A Take 1 capsule by mouth every 12 (twelve) hours. 07/19/22   Creig Hines, MD  Oxycodone HCl 20 MG TABS Take 0.5-1 tablets (10-20 mg total) by mouth every 4 (four) hours as needed. 08/06/22   Borders, Daryl Eastern, NP  polyethylene glycol (MIRALAX / GLYCOLAX) 17 g packet Take 17 g by mouth daily. 02/18/22   Leeroy Bock, MD  prednisoLONE acetate (PRED MILD) 0.12 % ophthalmic suspension Place 1 drop into both eyes daily.    [provider]  prochlorperazine (COMPAZINE) 10 MG tablet Take 1 tablet (10 mg total) by mouth every 6 (six) hours as needed for nausea or vomiting. 06/24/22   Borders, Daryl Eastern, NP  simvastatin (ZOCOR) 40 MG tablet TAKE 1 TABLET EVERY DAY  AT  6:00 PM 12/20/14   Shelia Media, MD    Physical Exam: Vitals:   08/17/22 1730 08/17/22 1800 08/17/22 1830 08/17/22 1900  BP: (!) 109/57 122/60 (!) 111/54 (!) 107/58  Pulse: 70 (!) 109 (!)  104 (!) 106  Resp: 20 (!) 21 17 (!) 21  Temp:   99.8 F (37.7 C)   TempSrc:      SpO2: 96% 96% 95% 98%   General: Patient tends to sleep frequently during this encounter is very hard to wake up.  Patient has dysphonia and is very hard to understand.  Therefore history taking is very difficult.  Additionally during orientation question patient did not know what count she was and she is reported the year is 2023 and she claimed that she fell day before yesterday.  Therefore I have concerns about the reliability of history obtained from the patient.  However patient does not appear to be in any immediate distress Respiratory exam: Bilateral intravesicular Cardiovascular exam S1-S2 normal Abdomen all quadrants soft . LLQ tendernes. Extremities warm without edema.  Patient moves all 4 extremities to direction. Pupils are equal and round 1 mm. Data Reviewed:  Labs on Admission:  Results for orders placed or performed during the hospital encounter of 08/17/22 (from the past 24 hour(s))  Comprehensive metabolic panel     Status: Abnormal   Collection Time: 08/17/22  2:46 PM  Result Value Ref Range   Sodium 143 135 - 145 mmol/L   Potassium 4.4 3.5 - 5.1 mmol/L   Chloride 110 98 - 111 mmol/L   CO2 22 22 - 32 mmol/L   Glucose, Bld 139 (H) 70 - 99 mg/dL   BUN 46 (H) 8 - 23 mg/dL   Creatinine, Ser 4.09 (H) 0.44 - 1.00 mg/dL   Calcium 8.8 (L) 8.9 - 10.3 mg/dL   Total Protein 7.0 6.5 - 8.1 g/dL   Albumin 2.9 (L) 3.5 - 5.0 g/dL   AST 29 15 - 41 U/L   ALT 19 0 - 44 U/L   Alkaline Phosphatase 92 38 - 126 U/L   Total Bilirubin 0.4 0.3 - 1.2 mg/dL   GFR, Estimated 23 (L) >60 mL/min   Anion gap 11 5 - 15  CBC     Status: Abnormal   Collection Time: 08/17/22  2:46 PM  Result Value Ref Range   WBC 16.0 (H) 4.0 - 10.5 K/uL   RBC 3.34 (L) 3.87 - 5.11 MIL/uL   Hemoglobin 10.2 (L) 12.0 - 15.0 g/dL  HCT 33.3 (L) 36.0 - 46.0 %   MCV 99.7 80.0 - 100.0 fL   MCH 30.5 26.0 - 34.0 pg   MCHC 30.6 30.0 -  36.0 g/dL   RDW 19.1 (H) 47.8 - 29.5 %   Platelets 275 150 - 400 K/uL   nRBC 0.0 0.0 - 0.2 %  Lipase, blood     Status: None   Collection Time: 08/17/22  2:46 PM  Result Value Ref Range   Lipase 21 11 - 51 U/L  Urinalysis, Routine w reflex microscopic -Urine, Clean Catch     Status: Abnormal   Collection Time: 08/17/22  4:46 PM  Result Value Ref Range   Color, Urine YELLOW (A) YELLOW   APPearance TURBID (A) CLEAR   Specific Gravity, Urine 1.011 1.005 - 1.030   pH 6.0 5.0 - 8.0   Glucose, UA NEGATIVE NEGATIVE mg/dL   Hgb urine dipstick SMALL (A) NEGATIVE   Bilirubin Urine NEGATIVE NEGATIVE   Ketones, ur NEGATIVE NEGATIVE mg/dL   Protein, ur 621 (A) NEGATIVE mg/dL   Nitrite NEGATIVE NEGATIVE   Leukocytes,Ua LARGE (A) NEGATIVE   RBC / HPF 11-20 0 - 5 RBC/hpf   WBC, UA >50 0 - 5 WBC/hpf   Bacteria, UA MANY (A) NONE SEEN   Squamous Epithelial / HPF NONE SEEN 0 - 5 /HPF   WBC Clumps PRESENT   Lactic acid, plasma     Status: None   Collection Time: 08/17/22  5:47 PM  Result Value Ref Range   Lactic Acid, Venous 0.8 0.5 - 1.9 mmol/L    Radiological Exams on Admission:  DG Chest 2 View  Result Date: 08/17/2022 CLINICAL DATA:  Pain after fall.  History of lung cancer. EXAM: CHEST - 2 VIEW COMPARISON:  X-ray 08/06/2022.  CT scan 07/02/2022 FINDINGS: Underinflation. Increasing bandlike opacity left lung base. Atelectasis is favored over infiltrate but recommend follow-up. This has a nodular appearance. No pneumothorax or effusion. Normal cardiopericardial silhouette. Degenerative changes of the spine. Osteopenia. Stable right IJ chest port with tip at the SVC right atrial junction. Fixation hardware along the lower thoracic spine at the edge of the imaging field. IMPRESSION: Increasing left lung base opacity. Atelectasis is favored over infiltrate recommend follow-up. This has a nodular appearance slightly as well. Please correlate with previous CT scan from 07/02/2022. Chest port.  Electronically Signed   By: Karen Kays M.D.   On: 08/17/2022 16:14   CT Head Wo Contrast  Result Date: 08/17/2022 CLINICAL DATA:  Patient fell walking to the restroom.  Head injury. EXAM: CT HEAD WITHOUT CONTRAST TECHNIQUE: Contiguous axial images were obtained from the base of the skull through the vertex without intravenous contrast. RADIATION DOSE REDUCTION: This exam was performed according to the departmental dose-optimization program which includes automated exposure control, adjustment of the mA and/or kV according to patient size and/or use of iterative reconstruction technique. COMPARISON:  MRI brain 02/16/2022.  CT head 01/11/2021. FINDINGS: Brain: There is no evidence for acute hemorrhage, hydrocephalus, mass lesion, or abnormal extra-axial fluid collection. No definite CT evidence for acute infarction. Diffuse loss of parenchymal volume is consistent with atrophy. Patchy low attenuation in the deep hemispheric and periventricular white matter is nonspecific, but likely reflects chronic microvascular ischemic demyelination. Vascular: No hyperdense vessel or unexpected calcification. Skull: No evidence for fracture. No worrisome lytic or sclerotic lesion. Sinuses/Orbits: Chronic mucosal disease noted in the sphenoid sinuses, minimally progressive since 01/11/2021. Visualized portions of the globes and intraorbital fat are unremarkable. Other: None.  IMPRESSION: 1. No acute intracranial abnormality. 2. Atrophy with chronic small vessel ischemic disease. 3. Chronic sphenoid sinusitis, minimally progressive since 01/11/2021. Electronically Signed   By: Kennith Center M.D.   On: 08/17/2022 15:07    EKG reviewed - sinus tachy baseline artifact.   Assessment and Plan: * Urinary tract infection Treat with Unasyn  At high risk for aspiration Continue with mechanically altered diet  Pulmonary embolism (HCC) This is a chronic diagnosis, at this time I will I will hold patient's apixaban until CT  abdomen is done  Leukocytosis Concern for UTI and aspiration pneumonia.  Treating with Unasyn and azithromycin.  Cultures have been ordered  AMS (altered mental status) Note, developing over 5 days.  Likely metabolic given patient has marked leukocytosis tachycardia and suspected infection.  However I am concerned about patient standing opiate use, will reduce this and obviously keep as needed opiates handy for patient pain control.  At this time patient does not report any uncontrolled pain.  Therefore I feel comfortable reducing the standing opiates.  If poor response to above measures, will consider MRI brain at that time  AKI (acute kidney injury) (HCC) CT abdomen to rule out hydronephrosis.  IV fluids, check urine sodium and creatinine.  Monitor response to IV fluids given patient has been vomiting and having very poor p.o. intake   Medication reconciliation done: I have at this time held off on starting patient's antihypertensive medications given patient's soft blood pressure.  I have held off patient standing opiates.  Patient's olanzapine has been continued for evening, however it must be held for sedation now.   Advance Care Planning:   Code Status: Full Code based on discussion with patient and prior documentation  Consults:   Family Communication: Grandson at the bedside.  Daughter aware patient is here  Severity of Illness: The appropriate patient status for this patient is INPATIENT. Inpatient status is judged to be reasonable and necessary in order to provide the required intensity of service to ensure the patient's safety. The patient's presenting symptoms, physical exam findings, and initial radiographic and laboratory data in the context of their chronic comorbidities is felt to place them at high risk for further clinical deterioration. Furthermore, it is not anticipated that the patient will be medically stable for discharge from the hospital within 2 midnights of admission.    * I certify that at the point of admission it is my clinical judgment that the patient will require inpatient hospital care spanning beyond 2 midnights from the point of admission due to high intensity of service, high risk for further deterioration and high frequency of surveillance required.*  Author: Nolberto Hanlon, MD 08/17/2022 8:04 PM  For on call review [email or web address].

## 2022-08-17 NOTE — ED Notes (Signed)
Hospitalist at bedside at this time 

## 2022-08-17 NOTE — ED Provider Notes (Signed)
[facility] Provider Note  Patient Contact: 4:01 PM (approximate)   History   Fall   HPI  Amber Glenn is a 72 y.o. female with a history of lung cancer, diagnosed in November currently on chemo, presents to the emergency department with an acute fall that was unwitnessed.  Patient's daughter reports that she found patient in the hallway and she was sitting upright on her bottom.  Daughter is a Engineer, civil (consulting) and states that patient has seemed confused for the past 24 hours and that she seems unsteady on her feet.  Daughter states that patient is particularly prone to pneumonia and she was concerned about a possible urinary tract infection.  She has had no fever or chills at home.  No complaints of chest pain, chest tightness or shortness of breath.  Patient has had some vomiting secondary to the chemo but no diarrhea.      Physical Exam   Triage Vital Signs: ED Triage Vitals  Enc Vitals Group     BP 08/17/22 1443 (!) 112/56     Pulse Rate 08/17/22 1443 (!) 116     Resp 08/17/22 1443 18     Temp 08/17/22 1443 98.8 F (37.1 C)     Temp Source 08/17/22 1443 Oral     SpO2 08/17/22 1443 94 %     Weight --      Height --      Head Circumference --      Peak Flow --      Pain Score 08/17/22 1444 7     Pain Loc --      Pain Edu? --      Excl. in GC? --     Most recent vital signs: Vitals:   08/17/22 1800 08/17/22 1830  BP: 122/60 (!) 111/54  Pulse: (!) 109 (!) 104  Resp: (!) 21 17  Temp:  99.8 F (37.7 C)  SpO2: 96% 95%     General: Alert and in no acute distress. Eyes:  PERRL. EOMI. Head: No acute traumatic findings ENT:      Nose: No congestion/rhinnorhea.      Mouth/Throat: Mucous membranes are moist.  Neck: No stridor. No cervical spine tenderness to palpation. Cardiovascular:  Good peripheral perfusion Respiratory: Normal respiratory effort without tachypnea or retractions. Lungs CTAB. Good air entry to the bases with no decreased or absent  breath sounds. Gastrointestinal: Bowel sounds 4 quadrants. Soft and nontender to palpation. No guarding or rigidity. No palpable masses. No distention. No CVA tenderness. Musculoskeletal: Full range of motion to all extremities.  Neurologic:  No gross focal neurologic deficits are appreciated.  Skin:   No rash noted   ED Results / Procedures / Treatments   Labs (all labs ordered are listed, but only abnormal results are displayed) Labs Reviewed  COMPREHENSIVE METABOLIC PANEL - Abnormal; Notable for the following components:      Result Value   Glucose, Bld 139 (*)    BUN 46 (*)    Creatinine, Ser 2.18 (*)    Calcium 8.8 (*)    Albumin 2.9 (*)    GFR, Estimated 23 (*)    All other components within normal limits  CBC - Abnormal; Notable for the following components:   WBC 16.0 (*)    RBC 3.34 (*)    Hemoglobin 10.2 (*)    HCT 33.3 (*)    RDW 19.1 (*)    All other components within normal limits  URINALYSIS, ROUTINE W REFLEX  MICROSCOPIC - Abnormal; Notable for the following components:   Color, Urine YELLOW (*)    APPearance TURBID (*)    Hgb urine dipstick SMALL (*)    Protein, ur 100 (*)    Leukocytes,Ua LARGE (*)    Bacteria, UA MANY (*)    All other components within normal limits  CULTURE, BLOOD (ROUTINE X 2)  CULTURE, BLOOD (ROUTINE X 2)  URINE CULTURE  LIPASE, BLOOD  LACTIC ACID, PLASMA  LACTIC ACID, PLASMA  CBG MONITORING, ED        RADIOLOGY  I personally viewed and evaluated these images as part of my medical decision making, as well as reviewing the written report by the radiologist.  ED Provider Interpretation: No acute abnormality on head CT.  Chest x-ray indicates possible left lower lung opacity versus atelectasis.   PROCEDURES:  Critical Care performed: No  Procedures   MEDICATIONS ORDERED IN ED: Medications  cefTRIAXone (ROCEPHIN) 2 g in sodium chloride 0.9 % 100 mL IVPB (0 g Intravenous Stopped 08/17/22 1745)  sodium chloride 0.9 %  bolus 1,000 mL (1,000 mLs Intravenous New Bag/Given 08/17/22 1746)     IMPRESSION / MDM / ASSESSMENT AND PLAN / ED COURSE  I reviewed the triage vital signs and the nursing notes.                              Assessment and plan:  Fall Weakness 72 year old female with history of lung cancer presents to the emergency department after she had a unwitnessed fall with history of recent confusion.  Patient was hypotensive at triage and tachycardic.  She has been intermittently tachypneic.  On exam, patient would be awake and talkative and would fall asleep.  She was easily arousable.  Most of the historical information was provided by patient's daughter.  On exam, abdomen was soft and nontender.  Differential diagnosis included sepsis, UTI, pneumonia, intracranial bleeding, skull fracture...  Patient's white blood cell count 16 which is significantly increased from prior which was 3.28 days ago.  Creatinine 2.18 increased from 1.17.  Urinalysis concerning for UTI with a large amount of leukocytes and many bacteria.  Urine culture and blood cultures in process.  Patient given normal saline bolus and 2 g of Rocephin while in the emergency department.  Patient was accepted for admission      FINAL CLINICAL IMPRESSION(S) / ED DIAGNOSES   Final diagnoses:  Fall, initial encounter  Sepsis, due to unspecified organism, unspecified whether acute organ dysfunction present [facility])     Rx / DC Orders   ED Discharge Orders     None        Note:  This document was prepared using Dragon voice recognition software and may include unintentional dictation errors.   Pia Mau [place], PA-C 08/17/22 1856    Minna Antis, MD 08/17/22 2144

## 2022-08-17 NOTE — Assessment & Plan Note (Signed)
Treat with Unasyn

## 2022-08-17 NOTE — ED Notes (Signed)
Called dietary for meal tray and thickened liquids

## 2022-08-17 NOTE — Sepsis Progress Note (Signed)
Antibiotics given @ 1714, code sepsis called @ 1736

## 2022-08-18 ENCOUNTER — Other Ambulatory Visit: Payer: Self-pay | Admitting: Oncology

## 2022-08-18 DIAGNOSIS — Z79899 Other long term (current) drug therapy: Secondary | ICD-10-CM | POA: Diagnosis not present

## 2022-08-18 DIAGNOSIS — Z923 Personal history of irradiation: Secondary | ICD-10-CM | POA: Diagnosis not present

## 2022-08-18 DIAGNOSIS — N179 Acute kidney failure, unspecified: Secondary | ICD-10-CM | POA: Diagnosis present

## 2022-08-18 DIAGNOSIS — Y92009 Unspecified place in unspecified non-institutional (private) residence as the place of occurrence of the external cause: Secondary | ICD-10-CM | POA: Diagnosis not present

## 2022-08-18 DIAGNOSIS — Z85828 Personal history of other malignant neoplasm of skin: Secondary | ICD-10-CM | POA: Diagnosis not present

## 2022-08-18 DIAGNOSIS — C3491 Malignant neoplasm of unspecified part of right bronchus or lung: Secondary | ICD-10-CM

## 2022-08-18 DIAGNOSIS — Z947 Corneal transplant status: Secondary | ICD-10-CM | POA: Diagnosis not present

## 2022-08-18 DIAGNOSIS — A419 Sepsis, unspecified organism: Secondary | ICD-10-CM | POA: Diagnosis present

## 2022-08-18 DIAGNOSIS — E785 Hyperlipidemia, unspecified: Secondary | ICD-10-CM | POA: Diagnosis present

## 2022-08-18 DIAGNOSIS — I129 Hypertensive chronic kidney disease with stage 1 through stage 4 chronic kidney disease, or unspecified chronic kidney disease: Secondary | ICD-10-CM | POA: Diagnosis present

## 2022-08-18 DIAGNOSIS — N390 Urinary tract infection, site not specified: Secondary | ICD-10-CM | POA: Diagnosis present

## 2022-08-18 DIAGNOSIS — N1831 Chronic kidney disease, stage 3a: Secondary | ICD-10-CM | POA: Diagnosis present

## 2022-08-18 DIAGNOSIS — K5720 Diverticulitis of large intestine with perforation and abscess without bleeding: Secondary | ICD-10-CM | POA: Diagnosis present

## 2022-08-18 DIAGNOSIS — W109XXA Fall (on) (from) unspecified stairs and steps, initial encounter: Secondary | ICD-10-CM | POA: Diagnosis present

## 2022-08-18 DIAGNOSIS — Z8249 Family history of ischemic heart disease and other diseases of the circulatory system: Secondary | ICD-10-CM | POA: Diagnosis not present

## 2022-08-18 DIAGNOSIS — E1122 Type 2 diabetes mellitus with diabetic chronic kidney disease: Secondary | ICD-10-CM | POA: Diagnosis present

## 2022-08-18 DIAGNOSIS — J9811 Atelectasis: Secondary | ICD-10-CM | POA: Diagnosis present

## 2022-08-18 DIAGNOSIS — K5792 Diverticulitis of intestine, part unspecified, without perforation or abscess without bleeding: Secondary | ICD-10-CM | POA: Diagnosis not present

## 2022-08-18 DIAGNOSIS — Z7901 Long term (current) use of anticoagulants: Secondary | ICD-10-CM | POA: Diagnosis not present

## 2022-08-18 DIAGNOSIS — Z87891 Personal history of nicotine dependence: Secondary | ICD-10-CM | POA: Diagnosis not present

## 2022-08-18 DIAGNOSIS — Z7984 Long term (current) use of oral hypoglycemic drugs: Secondary | ICD-10-CM | POA: Diagnosis not present

## 2022-08-18 DIAGNOSIS — Z86711 Personal history of pulmonary embolism: Secondary | ICD-10-CM | POA: Diagnosis not present

## 2022-08-18 DIAGNOSIS — J189 Pneumonia, unspecified organism: Secondary | ICD-10-CM

## 2022-08-18 DIAGNOSIS — C7951 Secondary malignant neoplasm of bone: Secondary | ICD-10-CM | POA: Diagnosis present

## 2022-08-18 DIAGNOSIS — B965 Pseudomonas (aeruginosa) (mallei) (pseudomallei) as the cause of diseases classified elsewhere: Secondary | ICD-10-CM | POA: Diagnosis present

## 2022-08-18 DIAGNOSIS — Z803 Family history of malignant neoplasm of breast: Secondary | ICD-10-CM | POA: Diagnosis not present

## 2022-08-18 DIAGNOSIS — K219 Gastro-esophageal reflux disease without esophagitis: Secondary | ICD-10-CM | POA: Diagnosis present

## 2022-08-18 DIAGNOSIS — Z85118 Personal history of other malignant neoplasm of bronchus and lung: Secondary | ICD-10-CM | POA: Diagnosis not present

## 2022-08-18 LAB — HEPATIC FUNCTION PANEL
ALT: 15 U/L (ref 0–44)
AST: 24 U/L (ref 15–41)
Albumin: 2.8 g/dL — ABNORMAL LOW (ref 3.5–5.0)
Alkaline Phosphatase: 83 U/L (ref 38–126)
Bilirubin, Direct: <0.1 mg/dL (ref 0.0–0.2)
Total Bilirubin: 0.5 mg/dL (ref 0.3–1.2)
Total Protein: 6.6 g/dL (ref 6.5–8.1)

## 2022-08-18 LAB — HEMOGLOBIN A1C
Hgb A1c MFr Bld: 6.3 % — ABNORMAL HIGH (ref 4.8–5.6)
Mean Plasma Glucose: 134.11 mg/dL

## 2022-08-18 LAB — CBC
HCT: 29.4 % — ABNORMAL LOW (ref 36.0–46.0)
Hemoglobin: 9.2 g/dL — ABNORMAL LOW (ref 12.0–15.0)
MCH: 31 pg (ref 26.0–34.0)
MCHC: 31.3 g/dL (ref 30.0–36.0)
MCV: 99 fL (ref 80.0–100.0)
Platelets: 237 10*3/uL (ref 150–400)
RBC: 2.97 MIL/uL — ABNORMAL LOW (ref 3.87–5.11)
RDW: 19.2 % — ABNORMAL HIGH (ref 11.5–15.5)
WBC: 11.1 10*3/uL — ABNORMAL HIGH (ref 4.0–10.5)
nRBC: 0 % (ref 0.0–0.2)

## 2022-08-18 LAB — COMPREHENSIVE METABOLIC PANEL
ALT: 16 U/L (ref 0–44)
AST: 23 U/L (ref 15–41)
Albumin: 2.8 g/dL — ABNORMAL LOW (ref 3.5–5.0)
Alkaline Phosphatase: 84 U/L (ref 38–126)
Anion gap: 9 (ref 5–15)
BUN: 44 mg/dL — ABNORMAL HIGH (ref 8–23)
CO2: 23 mmol/L (ref 22–32)
Calcium: 8.2 mg/dL — ABNORMAL LOW (ref 8.9–10.3)
Chloride: 113 mmol/L — ABNORMAL HIGH (ref 98–111)
Creatinine, Ser: 1.93 mg/dL — ABNORMAL HIGH (ref 0.44–1.00)
GFR, Estimated: 27 mL/min — ABNORMAL LOW (ref >60)
Glucose, Bld: 142 mg/dL — ABNORMAL HIGH (ref 70–99)
Potassium: 4.3 mmol/L (ref 3.5–5.1)
Sodium: 145 mmol/L (ref 135–145)
Total Bilirubin: 0.6 mg/dL (ref 0.3–1.2)
Total Protein: 6.7 g/dL (ref 6.5–8.1)

## 2022-08-18 LAB — PROTIME-INR
INR: 1.7 — ABNORMAL HIGH (ref 0.8–1.2)
Prothrombin Time: 19.7 seconds — ABNORMAL HIGH (ref 11.4–15.2)

## 2022-08-18 LAB — SODIUM, URINE, RANDOM: Sodium, Ur: 66 mmol/L

## 2022-08-18 LAB — VITAMIN B12: Vitamin B-12: 1296 pg/mL — ABNORMAL HIGH (ref 180–914)

## 2022-08-18 LAB — GLUCOSE, CAPILLARY
Glucose-Capillary: 125 mg/dL — ABNORMAL HIGH (ref 70–99)
Glucose-Capillary: 156 mg/dL — ABNORMAL HIGH (ref 70–99)
Glucose-Capillary: 120 mg/dL — ABNORMAL HIGH (ref 70–99)
Glucose-Capillary: 128 mg/dL — ABNORMAL HIGH (ref 70–99)

## 2022-08-18 LAB — MAGNESIUM: Magnesium: 1.9 mg/dL (ref 1.7–2.4)

## 2022-08-18 LAB — PHOSPHORUS: Phosphorus: 3.2 mg/dL (ref 2.5–4.6)

## 2022-08-18 LAB — APTT: aPTT: 39 seconds — ABNORMAL HIGH (ref 24–36)

## 2022-08-18 LAB — CREATININE, URINE, RANDOM: Creatinine, Urine: 40 mg/dL

## 2022-08-18 MED ORDER — PANTOPRAZOLE SODIUM 40 MG PO TBEC
40.0000 mg | DELAYED_RELEASE_TABLET | Freq: Every day | ORAL | Status: DC
  Administered 2022-08-18 – 2022-08-19 (×2): 40 mg via ORAL
  Filled 2022-08-18 (×2): qty 1

## 2022-08-18 MED ORDER — LACTATED RINGERS IV SOLN: INTRAVENOUS | Status: DC

## 2022-08-18 MED ORDER — SIMVASTATIN 20 MG PO TABS
40.0000 mg | ORAL_TABLET | Freq: Every day | ORAL | Status: DC
  Administered 2022-08-18: 40 mg via ORAL
  Filled 2022-08-18: qty 2

## 2022-08-18 MED ORDER — POLYETHYLENE GLYCOL 3350 17 G PO PACK
17.0000 g | PACK | Freq: Every day | ORAL | Status: DC
  Administered 2022-08-19: 17 g via ORAL
  Filled 2022-08-18 (×2): qty 1

## 2022-08-18 MED ORDER — PIPERACILLIN-TAZOBACTAM 3.375 G IVPB
3.3750 g | Freq: Three times a day (TID) | INTRAVENOUS | Status: DC
  Administered 2022-08-18 – 2022-08-19 (×3): 3.375 g via INTRAVENOUS
  Filled 2022-08-18 (×3): qty 50

## 2022-08-18 MED ORDER — CHLORHEXIDINE GLUCONATE CLOTH 2 % EX PADS
6.0000 | MEDICATED_PAD | Freq: Every day | CUTANEOUS | Status: DC
  Administered 2022-08-18 – 2022-08-19 (×2): 6 via TOPICAL

## 2022-08-18 MED ORDER — FOLIC ACID 1 MG PO TABS
1.0000 mg | ORAL_TABLET | Freq: Every day | ORAL | Status: DC
  Administered 2022-08-18 – 2022-08-19 (×2): 1 mg via ORAL
  Filled 2022-08-18 (×2): qty 1

## 2022-08-18 MED ORDER — OXYCODONE HCL ER 10 MG PO T12A
10.0000 mg | EXTENDED_RELEASE_TABLET | Freq: Two times a day (BID) | ORAL | Status: DC
  Administered 2022-08-18 – 2022-08-19 (×3): 10 mg via ORAL
  Filled 2022-08-18 (×3): qty 1

## 2022-08-18 MED ORDER — INSULIN ASPART 100 UNIT/ML IJ SOLN: 0.0000 [IU] | Freq: Three times a day (TID) | INTRAMUSCULAR | Status: DC

## 2022-08-18 MED ORDER — CARVEDILOL 3.125 MG PO TABS
3.1250 mg | ORAL_TABLET | Freq: Two times a day (BID) | ORAL | Status: DC
  Administered 2022-08-18 – 2022-08-19 (×3): 3.125 mg via ORAL
  Filled 2022-08-18 (×3): qty 1

## 2022-08-18 MED ORDER — CYCLOBENZAPRINE HCL 10 MG PO TABS: 5.0000 mg | ORAL_TABLET | Freq: Three times a day (TID) | ORAL | Status: DC | PRN

## 2022-08-18 MED ORDER — OXYCODONE HCL 5 MG PO TABS
10.0000 mg | ORAL_TABLET | ORAL | Status: DC | PRN
  Administered 2022-08-18 (×2): 10 mg via ORAL
  Filled 2022-08-18 (×2): qty 2

## 2022-08-18 MED ORDER — INSULIN ASPART 100 UNIT/ML IJ SOLN: 0.0000 [IU] | Freq: Every day | INTRAMUSCULAR | Status: DC

## 2022-08-18 MED ORDER — CIPROFLOXACIN IN D5W 400 MG/200ML IV SOLN
400.0000 mg | INTRAVENOUS | Status: DC
  Filled 2022-08-18: qty 200

## 2022-08-18 MED ORDER — GABAPENTIN 100 MG PO CAPS
100.0000 mg | ORAL_CAPSULE | Freq: Two times a day (BID) | ORAL | Status: DC
  Administered 2022-08-18 – 2022-08-19 (×3): 100 mg via ORAL
  Filled 2022-08-18 (×3): qty 1

## 2022-08-18 MED ORDER — APIXABAN 5 MG PO TABS
5.0000 mg | ORAL_TABLET | Freq: Two times a day (BID) | ORAL | Status: DC
  Administered 2022-08-18 – 2022-08-19 (×3): 5 mg via ORAL
  Filled 2022-08-18 (×3): qty 1

## 2022-08-18 MED ORDER — PREDNISOLONE ACETATE 0.12 % OP SUSP
1.0000 [drp] | Freq: Every day | OPHTHALMIC | Status: DC
  Administered 2022-08-18 – 2022-08-19 (×2): 1 [drp] via OPHTHALMIC
  Filled 2022-08-18 (×2): qty 5

## 2022-08-18 MED ORDER — OLANZAPINE 5 MG PO TABS
10.0000 mg | ORAL_TABLET | Freq: Every day | ORAL | Status: DC
  Administered 2022-08-18: 10 mg via ORAL
  Filled 2022-08-18: qty 2

## 2022-08-18 NOTE — Evaluation (Signed)
Physical Therapy Evaluation Patient Details Name: Amber Glenn MRN: [id number] DOB: [birth year] Today's Date: 08/18/2022  History of Present Illness  72 y.o. 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 Curetly being maintained on chemo therapy.     Patient baseline status for several weeks, per patient has been limited PO intake. Occasional vomiting. Recurent aspirations events. Fatigue, comes in after fall at home.  Clinical Impression  Pt did well with PT exam showing good confidence with mobility, ambulation and stair negotiation.  She safely circumambulated the nurses' station and negotiated steps w/o AD and w/o assist from PT.  Pt is at/near baseline, daughter present t/o session and confirms as much.  Pt safe to return home from a PT stand-point, does not require further PT f/u.  Will sign off.       Recommendations for follow up therapy are one component of a multi-disciplinary discharge planning process, led by the attending physician.  Recommendations may be updated based on patient status, additional functional criteria and insurance authorization.  Follow Up Recommendations       Assistance Recommended at Discharge PRN  Patient can return home with the following       Equipment Recommendations None recommended by PT  Recommendations for Other Services       Functional Status Assessment Patient has not had a recent decline in their functional status     Precautions / Restrictions Precautions Precautions: Fall Restrictions Weight Bearing Restrictions: No      Mobility  Bed Mobility Overal bed mobility: Independent                  Transfers Overall transfer level: Independent                      Ambulation/Gait Ambulation/Gait assistance: Supervision Gait Distance (Feet): 250 Feet Assistive device: None         General Gait Details: Pt with safe and consistent cadence during prolonged bout of ambulation  w/o AD.  Vitals stable, no c/o fatigue.  Stairs Stairs: Yes Stairs assistance: Supervision Stair Management: One rail Right Number of Stairs: 3 General stair comments: Pt able to safely and confidently negotiate up/down steps simulating entry/exit to the home, light use of UEs  Wheelchair Mobility    Modified Rankin (Stroke Patients Only)       Balance Overall balance assessment: Modified Independent                                           Pertinent Vitals/Pain Pain Assessment Pain Assessment: No/denies pain    Home Living Family/patient expects to be discharged to:: Private residence Living Arrangements: Children Available Help at Discharge: Family;Available 24 hours/day Type of Home: House Home Access: Stairs to enter Entrance Stairs-Rails: Right Entrance Stairs-Number of Steps: 2   Home Layout: One level Home Equipment: Agricultural consultant (2 wheels);Rollator (4 wheels)      Prior Function Prior Level of Function : Independent/Modified Independent;Working/employed;Driving             Mobility Comments: independent without AD, working until recently, this and 1 other fall in the past 6 months. ADLs Comments: Pt largely independent but fatigue, etc has ment some limited increased assist from daughter     Hand Dominance        Extremity/Trunk Assessment   Upper Extremity Assessment Upper  Extremity Assessment: Overall WFL for tasks assessed    Lower Extremity Assessment Lower Extremity Assessment: Overall WFL for tasks assessed       Communication   Communication:  (pt able to interact well, weak/altered vocalizations but appropriate t/o)  Cognition Arousal/Alertness: Awake/alert Behavior During Therapy: WFL for tasks assessed/performed Overall Cognitive Status: Within Functional Limits for tasks assessed                                 General Comments: Pt more alert and awake vs presentation on arrival         General Comments      Exercises     Assessment/Plan    PT Assessment Patient does not need any further PT services  PT Problem List         PT Treatment Interventions Stair training;Gait training;Patient/family education    PT Goals (Current goals can be found in the Care Plan section)  Acute Rehab PT Goals Patient Stated Goal: go home PT Goal Formulation: All assessment and education complete, DC therapy    Frequency       Co-evaluation               AM-PAC PT "6 Clicks" Mobility  Outcome Measure Help needed turning from your back to your side while in a flat bed without using bedrails?: None Help needed moving from lying on your back to sitting on the side of a flat bed without using bedrails?: None Help needed moving to and from a bed to a chair (including a wheelchair)?: None Help needed standing up from a chair using your arms (e.g., wheelchair or bedside chair)?: None Help needed to walk in hospital room?: None Help needed climbing 3-5 steps with a railing? : None 6 Click Score: 24    End of Session Equipment Utilized During Treatment: Gait belt Activity Tolerance: Patient tolerated treatment well Patient left: with call bell/phone within reach;with family/visitor present;with nursing/sitter in room (chair pad in place, nursing aware) Nurse Communication: Mobility status PT Visit Diagnosis: Muscle weakness (generalized) (M62.81);History of falling (Z91.81)    Time: 1914-7829 PT Time Calculation (min) (ACUTE ONLY): 16 min   Charges:   PT Evaluation $PT Eval Low Complexity: 1 Low          Malachi Pro, DPT 08/18/2022, 10:39 AM

## 2022-08-18 NOTE — TOC Initial Note (Signed)
Transition of Care [facility]) - Initial/Assessment Note    Patient Details  Name: Amber Glenn MRN: [id number] Date of Birth: 1951-02-12  Transition of Care [facility]) CM/SW Contact:    Marlowe Sax, RN Phone Number: 08/18/2022, 12:11 PM  Clinical Narrative:           Transition of Care [facility]) Screening Note   Patient Details  Name: Amber Glenn Date of Birth: 1951/03/15   Transition of Care [facility]) CM/SW Contact:    Marlowe Sax, RN Phone Number: 08/18/2022, 12:11 PM    Transition of Care Department [facility]) has reviewed patient and no TOC needs have been identified at this time. We will continue to monitor patient advancement through interdisciplinary progression rounds. If new patient transition needs arise, please place a TOC consult.           Expected Discharge Plan: Home/Self Care Barriers to Discharge: Continued Medical Work up, No Barriers Identified   Patient Goals and CMS Choice            Expected Discharge Plan and Services   Discharge Planning Services: CM Consult   Living arrangements for the past 2 months: Single Family Home                 DME Arranged: N/A DME Agency: NA       HH Arranged: NA HH Agency: NA        Prior Living Arrangements/Services Living arrangements for the past 2 months: Single Family Home Lives with:: Adult Children Patient language and need for interpreter reviewed:: Yes Do you feel safe going back to the place where you live?: Yes      Need for Family Participation in Patient Care: Yes (Comment) Care giver support system in place?: Yes (comment) Current home services: DME (Rolling Walker (2 wheels);Rollator (4 wheels)) Criminal Activity/Legal Involvement Pertinent to Current Situation/Hospitalization: No - Comment as needed  Activities of Daily Living   ADL Screening (condition at time of admission) Patient's cognitive ability adequate to safely complete daily activities?: No Is the patient deaf or have  difficulty hearing?: No Does the patient have difficulty seeing, even when wearing glasses/contacts?: Yes Does the patient have difficulty concentrating, remembering, or making decisions?: Yes Patient able to express need for assistance with ADLs?: Yes Does the patient have difficulty dressing or bathing?: Yes Independently performs ADLs?: Yes (appropriate for developmental age) Does the patient have difficulty walking or climbing stairs?: Yes Weakness of Legs: Both Weakness of Arms/Hands: None  Permission Sought/Granted   Permission granted to share information with : Yes, Verbal Permission Granted              Emotional Assessment Appearance:: Appears stated age Attitude/Demeanor/Rapport: Engaged Affect (typically observed): Pleasant Orientation: : Oriented to Self, Oriented to Place, Oriented to  Time, Oriented to Situation Alcohol / Substance Use: Not Applicable Psych Involvement: No (comment)  Admission diagnosis:  UTI (urinary tract infection) [N39.0] Fall, initial encounter [W19.XXXA] Sepsis, due to unspecified organism, unspecified whether acute organ dysfunction present [facility]) [A41.9] Patient Active Problem List   Diagnosis Date Noted   Fall 08/17/2022   Urinary tract infection 08/17/2022   At high risk for aspiration 08/17/2022   AKI (acute kidney injury) (HCC) 08/17/2022   AMS (altered mental status) 08/17/2022   Leukocytosis 08/17/2022   Iron deficiency anemia 04/16/2022   Acute on chronic respiratory failure with hypoxia (HCC) 04/01/2022   Multifocal pneumonia 04/01/2022   Superficial thrombophlebitis 04/01/2022   Pulmonary embolism (HCC) 03/31/2022  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 site [facility]) 03/03/2022   Metastatic adenocarcinoma (HCC) 02/18/2022   Acute low back pain 02/17/2022   Adenocarcinoma (HCC) 02/17/2022   Delirium due to another medical condition 02/17/2022   Palliative care encounter 02/17/2022   Pathologic lumbar  vertebral fracture 02/11/2022   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   Neoplasm related pain 02/11/2022   Supraclavicular adenopathy 02/11/2022   Mediastinal mass 02/11/2022   Goals of care, counseling/discussion 02/11/2022   Pathologic fracture of lumbar vertebra, initial encounter 02/11/2022   Pathologic fracture 02/10/2022   Pyuria 02/10/2022   Diarrhea    COVID-19 08/18/2019   Thumb pain 06/26/2015   Medicare annual wellness visit, subsequent 03/14/2015   Arthralgia 09/02/2014   Hot flashes 01/21/2014   Left shoulder pain 09/20/2013   Unspecified constipation 08/30/2012   Routine general medical examination at a health care facility 05/29/2012   Screening for colon cancer 05/29/2012   Hypertension 08/18/2011   Osteoarthritis 03/20/2009   Diabetes mellitus type 2, controlled (HCC) 11/14/2008   BASAL CELL CARCINOMA, FACE 12/06/2006   Hyperlipidemia 12/06/2006   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   ALLERGIC RHINITIS 12/06/2006   GERD 12/06/2006   DEGENERATIVE JOINT DISEASE, GENERALIZED 12/06/2006   PCP:  Dortha Kern, MD Pharmacy:   [facility] [address], [place] - [address] [place] [address] Phone: [phone] Fax: [phone]     Social Determinants of Health (SDOH) Social History: SDOH Screenings   Food Insecurity: No Food Insecurity (08/18/2022)  Housing: Low Risk  (08/18/2022)  Transportation Needs: No Transportation Needs (08/18/2022)  Utilities: Not At Risk (08/18/2022)  Tobacco Use: Medium Risk (08/17/2022)   SDOH Interventions:     Readmission Risk Interventions     No data to display

## 2022-08-18 NOTE — Progress Notes (Signed)
Mobility Specialist - Progress Note   08/18/22 0921  Mobility  Activity Ambulated independently in room  Level of Assistance Independent  Assistive Device None  Distance Ambulated (ft) 20 ft  Activity Response Tolerated well  Mobility Referral Yes  $Mobility charge 1 Mobility  Mobility Specialist Start Time (ACUTE ONLY) 0914  Mobility Specialist Stop Time (ACUTE ONLY) 0920  Mobility Specialist Time Calculation (min) (ACUTE ONLY) 6 min   Pt OOB in bathroom upon arrival. Pt STS and ambulates in room indep with no LOB noted. Pt left in recliner with needs in reach and visitor present.   [name]  Mobility Specialist  08/18/22 9:23 AM

## 2022-08-18 NOTE — Consult Note (Signed)
Pharmacy Antibiotic Note  Amber Glenn is a 72 y.o. female admitted on 08/17/2022 with progressive somnolence and disinterest in activities around her. She reportedly has occasional vomiting and recurrent aspirations. PMH significant for lung cancer (on chemo, s/p rads), DM, GERD, HTN, HLD. CXR revealed increasing LLL opacity. Pharmacy has been consulted for Unasyn dosing.  Plan: Day 2 of antibiotics Continue Unasyn 3g IV Q12H Patient is also on azithromycin 500 mg IV Q24H Continue to monitor renal function and follow culture results  Weight: 53.1 kg (117 lb)  Temp (24hrs), Avg:98.8 F (37.1 C), Min:97.9 F (36.6 C), Max:99.8 F (37.7 C)  Recent Labs  Lab 08/17/22 1446 08/17/22 1747 08/18/22 0900  WBC 16.0*  --  11.1*  CREATININE 2.25*  2.18*  --  1.93*  LATICACIDVEN  --  0.8  --      Estimated Creatinine Clearance: 18.6 mL/min (A) (by C-G formula based on SCr of 1.93 mg/dL (H)).    No Known Allergies  Antimicrobials this admission: 5/7 Ceftriaxone x 1   5/7 Azithromycin >>  5/8 Unasyn >>  Dose adjustments this admission:  Microbiology results: 5/7 BCx: IP 5/7 UCx: IP   Thank you for allowing pharmacy to be a part of this patient's care.  Celene Squibb, PharmD PGY1 Pharmacy Resident 08/18/2022 9:58 AM

## 2022-08-18 NOTE — Progress Notes (Addendum)
[facility] Progress Note  Patient: Amber Glenn    ZOX:[id number]  DOA: 08/17/2022     Date of Service: the patient was seen and examined on 08/18/2022  Chief Complaint  Patient presents with   Fall   Brief hospital course: Amber Glenn is a 72 y.o. 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  For past 5 days patient progressively developed somnolence and not very active are not interested in her ADLs.  Patient was walking and missed a step and fell at home.  Patient was found sitting upright on her bottom in the hallway by her daughter and she brought her to the ER. ED workup: Tachycardic, BP soft, serum glucose 139 mild hyperglycemia, elevated creatinine 2.25, leukocytosis WBC 16, mild anemia hemoglobin 10.2, INR 1.7 slightly elevated, UA positive urine culture pending.   CT a/p:  Sigmoid diverticulitis with a 3 cm diverticula or diverticular abscess in the left lower quadrant. Constipation. No bowel obstruction. No hydronephrosis or nephrolithiasis. Overall increase in the left perihilar soft tissue component since the prior CT. Follow-up with chest CT on a nonemergent/outpatient basis recommended. CT head negative for any acute findings  Assessment and Plan:  Diverticulitis with 3 cm diverticulum versus abscess CT A/P reviewed Continue Zosyn IV antibiotics Continue MiraLAX to prevent constipation Continue as needed medication for pain control Blood culture negative Discussed with patient's daughter, recommended continue antibiotics, conservative management, no need to consult general surgery at this time.  Recommended to follow with GI for possible colonoscopy as an outpatient.  Patient's daughter agreed with the plan. Follow urine  culture   UTI UA positive, urine cultures pending Patient is already on Zosyn Continue IV fluid for hydration  AKI on CKD stage IIIa Baseline creatinine 1.06-1.2 Creatinine 2.18 on admission Continue IV fluid for hydration Monitor urine output and renal functions daily   [facility] pneumonia Patient was complaining of cough and some shortness of breath CXR Increasing left lung base opacity. Atelectasis is favored over infiltrate recommend follow-up.  Continue azithromycin and Zosyn   History of lung cancer with metastasis to the bone Follow with oncologist as an outpatient Continue OxyContin 10 mg every 12 hours and as needed Dose was decreased as patient was very somnolent on arrival and fell at home.   History of pulmonary embolism, continued Eliquis 5 mg p.o. twice daily home dose   Hypertension and HLD Coreg with holding parameters, continue simvastatin   NIDDM T2, held home medications for now Continue NovoLog sliding scale, monitor CBG Continue diabetic diet    Body mass index is 22.85 kg/m.  Interventions:    Diet: Regular diet with nectar thick liquids DVT Prophylaxis: Therapeutic Anticoagulation with Eliquis    Advance goals of care discussion: Full code  Family Communication: family was present at bedside, at the time of interview.  The pt provided permission to discuss medical plan with the family. Opportunity was given to ask question and all questions were answered satisfactorily.   Disposition:  Pt is from Home, admitted with Fall and somnolence, found to have diverticulitis, pneumonia and UTI, still on IV antibiotics, which precludes a safe discharge. Discharge to home, when clinically stable, may require 1 to 2 days more.  Subjective: No significant events overnight, patient was resting only, denies any  nausea vomiting or diarrhea, no abdominal pain, no chest pain or palpitation, no shortness of breath.  Patient did mention on further  questioning that she was having some cough with phlegm production.  On palpation she was having left lower quadrant abdominal tenderness.  Physical Exam: General: NAD, lying comfortably Appear in no distress, affect appropriate Eyes: PERRLA ENT: Oral Mucosa Clear, moist  Neck: no JVD,  Cardiovascular: S1 and S2 Present, no Murmur,  Respiratory: good respiratory effort, Bilateral Air entry equal and Decreased, no Crackles, no wheezes Abdomen: Bowel Sound present, Soft and LLQ tenderness,  Skin: no rashes Extremities: no Pedal edema, no calf tenderness Neurologic: without any new focal findings Gait not checked due to patient safety concerns  Vitals:   08/18/22 0147 08/18/22 0842 08/18/22 1224 08/18/22 1541  BP: 110/60 121/66 124/63 108/79  Pulse: 100 93 98 91  Resp: 20 17  18   Temp: 99.3 F (37.4 C) 97.9 F (36.6 C)  98.3 F (36.8 C)  TempSrc:      SpO2: 96% 98%  97%  Weight:        Intake/Output Summary (Last 24 hours) at 08/18/2022 1558 Last data filed at 08/18/2022 1429 Gross per 24 hour  Intake 1871.33 ml  Output --  Net 1871.33 ml   Filed Weights   08/17/22 2100  Weight: 53.1 kg    Data Reviewed: I have personally reviewed and interpreted daily labs, tele strips, imagings as discussed above. I reviewed all nursing notes, pharmacy notes, vitals, pertinent old records I have discussed plan of care as described above with RN and patient/family.  CBC: Recent Labs  Lab 08/17/22 1446 08/18/22 0900  WBC 16.0* 11.1*  HGB 10.2* 9.2*  HCT 33.3* 29.4*  MCV 99.7 99.0  PLT 275 237   Basic Metabolic Panel: Recent Labs  Lab 08/17/22 1446 08/18/22 0900  NA 143 145  K 4.4 4.3  CL 110 113*  CO2 22 23  GLUCOSE 139* 142*  BUN 46* 44*  CREATININE 2.25*  2.18* 1.93*  CALCIUM 8.8* 8.2*  MG  --  1.9  PHOS  --  3.2    Studies: CT ABDOMEN PELVIS WO CONTRAST  Result Date: 08/17/2022 CLINICAL DATA:  Left lower quadrant abdominal pain. EXAM: CT ABDOMEN AND PELVIS  WITHOUT CONTRAST TECHNIQUE: Multidetector CT imaging of the abdomen and pelvis was performed following the standard protocol without IV contrast. RADIATION DOSE REDUCTION: This exam was performed according to the departmental dose-optimization program which includes automated exposure control, adjustment of the mA and/or kV according to patient size and/or use of iterative reconstruction technique. COMPARISON:  CT dated 07/02/2022. FINDINGS: Evaluation of this exam is limited in the absence of intravenous contrast as well as due to streak artifact caused by spinal hardware. Lower chest: There is mild eventration of the left hemidiaphragm with left lung base atelectasis/scarring. Overall increase in the left perihilar soft tissue component since the prior CT. This is only partially visualized on this exam. Follow-up with chest CT on a nonemergent/outpatient basis recommended additional scattered bibasilar streaky densities noted. Partially seen central venous line with tip at the cavoatrial junction. Advanced coronary vascular calcification of the LAD. No intra-abdominal free air or free fluid. Hepatobiliary: Small cyst in the right lobe of the liver. No biliary dilatation. The gallbladder is distended. No calcified gallstone or pericholecystic fluid. Pancreas: No acute findings. Spleen: Normal in size without focal abnormality. Adrenals/Urinary Tract: The adrenal glands are unremarkable. There is no hydronephrosis or nephrolithiasis on either side. The  visualized ureters appear unremarkable. The urinary bladder is distended. There is hazy appearance of the bladder wall. Correlation with urinalysis recommended to exclude cystitis. Stomach/Bowel: There is large amount of stool throughout the colon. There is diffuse colonic diverticulosis. There is a 3 cm diverticula or diverticular abscess in the sigmoid colon in the left lower quadrant with inflammatory changes and infiltration of the surrounding fat consistent with  acute diverticulitis. There is no bowel obstruction. The appendix is not visualized with certainty. No inflammatory changes identified in the right lower quadrant. Vascular/Lymphatic: Advanced aortoiliac atherosclerotic disease. The IVC is unremarkable. No portal venous gas. There is no adenopathy. Reproductive: The uterus is grossly unremarkable. No adnexal masses. Other: None Musculoskeletal: Osteopenia with degenerative changes of the spine. Old L1 compression fracture and vertebroplasty. Multilevel posterior fusion. No acute osseous pathology. IMPRESSION: 1. Sigmoid diverticulitis with a 3 cm diverticula or diverticular abscess in the left lower quadrant. 2. Constipation. No bowel obstruction. 3. No hydronephrosis or nephrolithiasis. 4. Overall increase in the left perihilar soft tissue component since the prior CT. Follow-up with chest CT on a nonemergent/outpatient basis recommended. 5.  Aortic Atherosclerosis (ICD10-I70.0). Electronically Signed   By: Elgie Collard M.D.   On: 08/17/2022 23:53   DG Chest 2 View  Result Date: 08/17/2022 CLINICAL DATA:  Pain after fall.  History of lung cancer. EXAM: CHEST - 2 VIEW COMPARISON:  X-ray 08/06/2022.  CT scan 07/02/2022 FINDINGS: Underinflation. Increasing bandlike opacity left lung base. Atelectasis is favored over infiltrate but recommend follow-up. This has a nodular appearance. No pneumothorax or effusion. Normal cardiopericardial silhouette. Degenerative changes of the spine. Osteopenia. Stable right IJ chest port with tip at the SVC right atrial junction. Fixation hardware along the lower thoracic spine at the edge of the imaging field. IMPRESSION: Increasing left lung base opacity. Atelectasis is favored over infiltrate recommend follow-up. This has a nodular appearance slightly as well. Please correlate with previous CT scan from 07/02/2022. Chest port. Electronically Signed   By: Karen Kays M.D.   On: 08/17/2022 16:14    Scheduled Meds:  apixaban   5 mg Oral BID   carvedilol  3.125 mg Oral BID WC   Chlorhexidine Gluconate Cloth  6 each Topical Daily   folic acid  1 mg Oral Daily   gabapentin  100 mg Oral BID   insulin aspart  0-15 Units Subcutaneous TID WC   insulin aspart  0-5 Units Subcutaneous QHS   OLANZapine  10 mg Oral QHS   oxyCODONE  10 mg Oral Q12H   pantoprazole  40 mg Oral Daily   polyethylene glycol  17 g Oral Daily   prednisoLONE acetate  1 drop Both Eyes Daily   simvastatin  40 mg Oral q1800   sodium chloride flush  3 mL Intravenous Q12H   Continuous Infusions:  azithromycin Stopped (08/17/22 2319)   lactated ringers     piperacillin-tazobactam (ZOSYN)  IV 3.375 g (08/18/22 1335)   PRN Meds: acetaminophen **OR** acetaminophen, cyclobenzaprine, oxyCODONE  Time spent: 50 minutes  Author: Gillis Glenn. MD [facility] 08/18/2022 3:58 PM  To reach On-call, see care teams to locate the attending and reach out to them via [email or web address]. If 7PM-7AM, please contact night-coverage If you still have difficulty reaching the attending provider, please page the [facility] (Director on Call) for [facility] on amion for assistance.

## 2022-08-18 NOTE — Evaluation (Signed)
Occupational Therapy Evaluation Patient Details Name: Amber Glenn MRN: [id number] DOB: [birth year] Today's Date: 08/18/2022   History of Present Illness 72 y.o. 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 Curetly being maintained on chemo therapy.     Patient baseline status for several weeks, per patient has been limited PO intake. Occasional vomiting. Recurent aspirations events. Fatigue, comes in after fall at home.   Clinical Impression   Upon entering the room, pt seated in recliner chair with daughter present in room. Pt reports living at home with family and being Ind in self care tasks. Pt shares IADL tasks with family and someone is always available if she needs assistance. Pt standing from recliner chair and ambulating to sink without use of AD. Pt stands at sink and performs grooming tasks independently. Pt does endorse fatigue and requesting to return to bed. Pt does not need skilled OT intervention at this time and pt agrees. OT to complete orders.      Recommendations for follow up therapy are one component of a multi-disciplinary discharge planning process, led by the attending physician.  Recommendations may be updated based on patient status, additional functional criteria and insurance authorization.   Assistance Recommended at Discharge None     Functional Status Assessment  Patient has not had a recent decline in their functional status  Equipment Recommendations  None recommended by OT       Precautions / Restrictions Precautions Precautions: Fall Restrictions Weight Bearing Restrictions: No      Mobility Bed Mobility Overal bed mobility: Independent                  Transfers Overall transfer level: Independent                        Balance Overall balance assessment: Modified Independent                                         ADL either performed or assessed with clinical  judgement   ADL Overall ADL's : Independent                                             Vision Baseline Vision/History: 1 Wears glasses Patient Visual Report: No change from baseline              Pertinent Vitals/Pain Pain Assessment Pain Assessment: No/denies pain     Hand Dominance Right   Extremity/Trunk Assessment Upper Extremity Assessment Upper Extremity Assessment: Overall WFL for tasks assessed   Lower Extremity Assessment Lower Extremity Assessment: Overall WFL for tasks assessed       Communication Communication Communication:  (pt able to interact well, weak/altered vocalizations but appropriate t/o)   Cognition Arousal/Alertness: Awake/alert Behavior During Therapy: WFL for tasks assessed/performed Overall Cognitive Status: Within Functional Limits for tasks assessed                                                  Home Living Family/patient expects to be discharged to:: Private residence Living Arrangements: Children Available Help at Discharge: Family;Available  24 hours/day Type of Home: House Home Access: Stairs to enter [employer] of Steps: 2 Entrance Stairs-Rails: Right Home Layout: One level               Home Equipment: Agricultural consultant (2 wheels);Rollator (4 wheels)          Prior Functioning/Environment Prior Level of Function : Independent/Modified Independent             Mobility Comments: independent without AD, working until recently, this and 1 other fall in the past 6 months. ADLs Comments: Pt largely independent with self care and pt and family perform IADL tasks together. She does not drive.                 OT Goals(Current goals can be found in the care plan section) Acute Rehab OT Goals Patient Stated Goal: to go home OT Goal Formulation: With patient/family Time For Goal Achievement: 08/18/22 Potential to Achieve Goals: Fair  OT Frequency:          AM-PAC OT "6 Clicks" Daily Activity     Outcome Measure Help from another person eating meals?: None Help from another person taking care of personal grooming?: None Help from another person toileting, which includes using toliet, bedpan, or urinal?: None Help from another person bathing (including washing, rinsing, drying)?: None Help from another person to put on and taking off regular upper body clothing?: None Help from another person to put on and taking off regular lower body clothing?: None 6 Click Score: 24   End of Session Nurse Communication: Mobility status  Activity Tolerance: Patient tolerated treatment well Patient left: in bed;with call bell/phone within reach;with family/visitor present                   Time: 0957-1010 OT Time Calculation (min): 13 min Charges:  OT General Charges $OT Visit: 1 Visit OT Evaluation $OT Eval Low Complexity: 1 Low  Jackquline Denmark, MS, OTR/L , CBIS ascom [phone]  08/18/22, 12:52 PM

## 2022-08-19 ENCOUNTER — Ambulatory Visit: Payer: Medicare HMO | Admitting: Speech Pathology

## 2022-08-19 DIAGNOSIS — K5792 Diverticulitis of intestine, part unspecified, without perforation or abscess without bleeding: Secondary | ICD-10-CM | POA: Diagnosis not present

## 2022-08-19 LAB — CBC
HCT: 27.5 % — ABNORMAL LOW (ref 36.0–46.0)
Hemoglobin: 8.6 g/dL — ABNORMAL LOW (ref 12.0–15.0)
MCH: 31.3 pg (ref 26.0–34.0)
MCHC: 31.3 g/dL (ref 30.0–36.0)
MCV: 100 fL (ref 80.0–100.0)
Platelets: 228 10*3/uL (ref 150–400)
RBC: 2.75 MIL/uL — ABNORMAL LOW (ref 3.87–5.11)
RDW: 19.2 % — ABNORMAL HIGH (ref 11.5–15.5)
WBC: 9.4 10*3/uL (ref 4.0–10.5)
nRBC: 0 % (ref 0.0–0.2)

## 2022-08-19 LAB — BASIC METABOLIC PANEL
Anion gap: 8 (ref 5–15)
BUN: 32 mg/dL — ABNORMAL HIGH (ref 8–23)
CO2: 24 mmol/L (ref 22–32)
Calcium: 8.2 mg/dL — ABNORMAL LOW (ref 8.9–10.3)
Chloride: 114 mmol/L — ABNORMAL HIGH (ref 98–111)
Creatinine, Ser: 1.65 mg/dL — ABNORMAL HIGH (ref 0.44–1.00)
GFR, Estimated: 33 mL/min — ABNORMAL LOW (ref >60)
Glucose, Bld: 132 mg/dL — ABNORMAL HIGH (ref 70–99)
Potassium: 4.1 mmol/L (ref 3.5–5.1)
Sodium: 146 mmol/L — ABNORMAL HIGH (ref 135–145)

## 2022-08-19 LAB — URINE CULTURE: Culture: >=100000 — AB

## 2022-08-19 LAB — MAGNESIUM: Magnesium: 2 mg/dL (ref 1.7–2.4)

## 2022-08-19 LAB — GLUCOSE, CAPILLARY
Glucose-Capillary: 137 mg/dL — ABNORMAL HIGH (ref 70–99)
Glucose-Capillary: 125 mg/dL — ABNORMAL HIGH (ref 70–99)

## 2022-08-19 LAB — CULTURE, BLOOD (ROUTINE X 2)

## 2022-08-19 LAB — PHOSPHORUS: Phosphorus: 3.4 mg/dL (ref 2.5–4.6)

## 2022-08-19 MED ORDER — CIPROFLOXACIN HCL 500 MG PO TABS: 500.0000 mg | ORAL_TABLET | Freq: Two times a day (BID) | ORAL | 0 refills | Status: AC

## 2022-08-19 MED ORDER — LACTATED RINGERS IV SOLN: INTRAVENOUS | Status: DC

## 2022-08-19 MED ORDER — HEPARIN SOD (PORK) LOCK FLUSH 100 UNIT/ML IV SOLN
500.0000 [IU] | Freq: Once | INTRAVENOUS | Status: AC
  Administered 2022-08-19: 500 [IU] via INTRAVENOUS
  Filled 2022-08-19: qty 5

## 2022-08-19 MED ORDER — METRONIDAZOLE 500 MG PO TABS: 500.0000 mg | ORAL_TABLET | Freq: Three times a day (TID) | ORAL | 0 refills | Status: AC

## 2022-08-19 NOTE — Discharge Summary (Signed)
[facility] Discharge Summary   Patient: Amber Glenn JXB:[id number]  PCP: Dortha Kern, MD  Date of admission: 08/17/2022   Date of discharge:  08/19/2022     Discharge Diagnoses:  Principal Problem:   Diverticulitis Active Problems:   Fall   Urinary tract infection   At high risk for aspiration   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 site [facility])   Pulmonary embolism (HCC)   AKI (acute kidney injury) (HCC)   AMS (altered mental status)   Leukocytosis   UTI (urinary tract infection)   Admitted From: Home Disposition:  Home   Recommendations for Outpatient Follow-up:  Follow-up with PCP in 1 week, repeat BMP next week to check renal functions.  Hold metformin for now until creatinine improves. Follow-up with GI for possible colonoscopy in 4 to 6 weeks Follow-up with general surgery if no improvement in the colitis or any worsening of symptoms. Follow with pulmonologist and oncologist for further management of lung cancer. Follow up LABS/TEST:  BMP in 1 wk   Diet recommendation: Regular diet with nectar thick liquids  Activity: The patient is advised to gradually reintroduce usual activities, as tolerated  Discharge Condition: stable  Code Status: Full code   History of present illness: As per the H and P dictated on admission Hospital Course:  Amber Glenn is a 72 y.o. 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  For past 5 days patient progressively developed somnolence and not very active are not interested in her ADLs.  Patient was walking and missed a step and fell at home.  Patient was found sitting upright on her bottom in the hallway by her daughter and she brought her to the ER. ED workup:  Tachycardic, BP soft, serum glucose 139 mild hyperglycemia, elevated creatinine 2.25, leukocytosis WBC 16, mild anemia hemoglobin 10.2, INR 1.7 slightly elevated, UA positive urine culture pending.   CT a/p:  Sigmoid diverticulitis with a 3 cm diverticula or diverticular abscess in the left lower quadrant. Constipation. No bowel obstruction. No hydronephrosis or nephrolithiasis. Overall increase in the left perihilar soft tissue component since the prior CT. Follow-up with chest CT on a nonemergent/outpatient basis recommended.  CT head negative for any acute findings   Assessment and Plan: # Diverticulitis with 3 cm diverticulum versus abscess CT A/P reviewed, s/p Zosyn IV antibiotics, MiraLAX to prevent constipation, s/p PRN pain meds. Blood culture negative. Discussed with patient's daughter, recommended continue antibiotics, conservative management, no need to consult general surgery at this time.  Recommended to follow with GI for possible colonoscopy as an outpatient.  Patient's daughter agreed with the plan.  Patient was discharged on Cipro and Flagyl for 10 additional days. # UTI due to Pseudomonas infection. UA positive, urine cultures grew Pseudomonas, pansensitive. S/p Zosyn, transition to oral Cipro as above. # AKI on CKD stage IIIa, Baseline creatinine 1.06-1.2 Creatinine 2.18 on admission, s/p IVF creatinine 1.65, gradually improving.  Patient was given an option to stay in the hospital on IV fluid versus discharging home and continue oral hydration.  Patient's daughter wanted to discharge home.  So advised to repeat BMP next week and follow with PCP.  Continue oral hydration. # Community-acquired pneumonia Patient was complaining of cough and some shortness of breath. CXR  Increasing left lung base opacity. Atelectasis is favored over infiltrate recommend follow-up. s/p azithromycin and Zosyn.  Prescribed ciprofloxacin for 10 days on discharge. # History of lung cancer with metastasis to  the bone Follow with oncologist as an outpatient. S/p OxyContin 10 mg every 12 hours and as needed Dose was decreased during hospital stay as patient was very somnolent on arrival and fell at home.  Resumed home dose on discharge, patient was advised to follow-up with PCP and pain management as an outpatient.  Avoid excessive opiate doses. # History of pulmonary embolism, continued Eliquis 5 mg p.o. twice daily home dose # Hypertension and HLD, resumed Coreg and simvastatin # NIDDM T2, Resumed home medications on discharge, advised to skip metformin until renal functions improved.  Monitor CBG at home, continue diabetic diet and follow with PCP.  Body mass index is 22.85 kg/m.  Nutrition Interventions:   - Patient was instructed, not to drive, operate heavy machinery, perform activities at heights, swimming or participation in water activities or provide baby sitting services while on Pain, Sleep and Anxiety Medications; until her outpatient Physician has advised to do so again.  - Also recommended to not to take more than prescribed Pain, Sleep and Anxiety Medications.  Patient was ambulatory without any assistance. On the day of the discharge the patient's vitals were stable, and no other acute medical condition were reported by patient. the patient was felt safe to be discharge at Home.  Consultants: None Procedures: None  Discharge Exam: General: Appear in no distress, no Rash; Oral Mucosa Clear, moist. Cardiovascular: S1 and S2 Present, no Murmur, Respiratory: normal respiratory effort, Bilateral Air entry present and no Crackles, no wheezes Abdomen: Bowel Sound present, Soft and no tenderness, no hernia Extremities: no Pedal edema, no calf tenderness Neurology: alert and oriented to time, place, and person affect appropriate.  Filed Weights   08/17/22 2100  Weight: 53.1 kg   Vitals:   08/18/22 1652 08/19/22 0840  BP: (!) 113/46 126/60  Pulse: 85 92  Resp:  18  Temp:  97.9  F (36.6 C)  SpO2:  98%    DISCHARGE MEDICATION: Allergies as of 08/19/2022   No Known Allergies      Medication List     STOP taking these medications    losartan 100 MG tablet Commonly known as: COZAAR       TAKE these medications    acetaminophen 500 MG tablet Commonly known as: TYLENOL Take 500 mg by mouth every 6 (six) hours as needed for moderate pain, mild pain, fever or headache.   carvedilol 3.125 MG tablet Commonly known as: COREG Take 3.125 mg by mouth 2 (two) times daily with a meal.   ciprofloxacin 500 MG tablet Commonly known as: Cipro Take 1 tablet (500 mg total) by mouth 2 (two) times daily for 10 days.   cyclobenzaprine 5 MG tablet Commonly known as: FLEXERIL Take 1 tablet (5 mg total) by mouth 3 (three) times daily as needed for muscle spasms.   dexamethasone 4 MG tablet Commonly known as: DECADRON TAKE 1 TABLET 2 TIMES DAILY STARTING DAY BEFORE PEMETREXED. THEN TAKE 2 TABS DAILY FOR 3 DAYS STARTING DAY AFTER CARBOPLATIN. TAKE WITH FOOD.   Eliquis 5 MG Tabs tablet Generic drug: apixaban TAKE 1 TABLET BY MOUTH TWICE A DAY   folic acid 1 MG tablet Commonly known as: FOLVITE Take 1 tablet (1 mg total) by mouth daily. Start 7 days before pemetrexed chemotherapy. Continue until 21 days after pemetrexed  completed.   gabapentin 100 MG capsule Commonly known as: NEURONTIN Take 100 mg by mouth 2 (two) times daily.   lidocaine-prilocaine cream Commonly known as: EMLA Apply 1 Application topically as needed.   LORazepam 0.5 MG tablet Commonly known as: ATIVAN Take 1 tablet (0.5 mg total) by mouth every 8 (eight) hours as needed for anxiety.   metFORMIN 500 MG tablet Commonly known as: GLUCOPHAGE Take 1 tablet (500 mg total) by mouth 2 (two) times daily with a meal. Start taking on: Aug 23, 2022 What changed: These instructions start on Aug 23, 2022. If you are unsure what to do until then, ask your doctor or other care provider.    metroNIDAZOLE 500 MG tablet Commonly known as: Flagyl Take 1 tablet (500 mg total) by mouth 3 (three) times daily for 10 days.   multivitamin capsule Take 1 capsule by mouth daily.   OLANZapine 10 MG tablet Commonly known as: ZYPREXA TAKE 1 TABLET BY MOUTH EVERYDAY AT BEDTIME What changed: See the new instructions.   omeprazole 20 MG capsule Commonly known as: PRILOSEC Take 1 capsule (20 mg total) by mouth daily.   ondansetron 8 MG tablet Commonly known as: Zofran Take 1 tablet (8 mg total) by mouth every 8 (eight) hours as needed for nausea or vomiting. Start on the third day after carboplatin.   Oxycodone HCl 20 MG Tabs Take 0.5-1 tablets (10-20 mg total) by mouth every 4 (four) hours as needed.   polyethylene glycol 17 g packet Commonly known as: MIRALAX / GLYCOLAX Take 17 g by mouth daily.   prednisoLONE acetate 0.12 % ophthalmic suspension Commonly known as: PRED MILD Place 1 drop into both eyes daily.   prochlorperazine 10 MG tablet Commonly known as: COMPAZINE Take 1 tablet (10 mg total) by mouth every 6 (six) hours as needed for nausea or vomiting.   simvastatin 40 MG tablet Commonly known as: ZOCOR TAKE 1 TABLET EVERY DAY  AT  6:00 PM   STOOL SOFTENER PO Take 1 capsule by mouth daily as needed (constipation).   vitamin C 1000 MG tablet Take 1,000 mg by mouth daily.   Xtampza ER 18 MG C12a Generic drug: oxyCODONE ER Take 1 capsule by mouth every 12 (twelve) hours.       No Known Allergies Discharge Instructions     Call MD for:  difficulty breathing, headache or visual disturbances   Complete by: As directed    Call MD for:  extreme fatigue   Complete by: As directed    Call MD for:  persistant dizziness or light-headedness   Complete by: As directed    Call MD for:  persistant nausea and vomiting   Complete by: As directed    Call MD for:  severe uncontrolled pain   Complete by: As directed    Call MD for:  temperature >100.4   Complete by:  As directed    Diet - low sodium heart healthy   Complete by: As directed    Discharge instructions   Complete by: As directed    Follow-up with PCP in 1 week, repeat BMP next week to check renal functions.  Hold metformin for now until creatinine improves. Follow-up with GI for possible colonoscopy in 4 to 6 weeks Follow-up with general surgery if no improvement in the colitis or any worsening of symptoms. Follow with pulmonologist and oncologist for further management of lung cancer. Pulmonologist Dr. Aundria Rud, [place]   Increase activity slowly   Complete by: As directed  The results of significant diagnostics from this hospitalization (including imaging, microbiology, ancillary and laboratory) are listed below for reference.    Significant Diagnostic Studies: CT ABDOMEN PELVIS WO CONTRAST  Result Date: 08/17/2022 CLINICAL DATA:  Left lower quadrant abdominal pain. EXAM: CT ABDOMEN AND PELVIS WITHOUT CONTRAST TECHNIQUE: Multidetector CT imaging of the abdomen and pelvis was performed following the standard protocol without IV contrast. RADIATION DOSE REDUCTION: This exam was performed according to the departmental dose-optimization program which includes automated exposure control, adjustment of the mA and/or kV according to patient size and/or use of iterative reconstruction technique. COMPARISON:  CT dated 07/02/2022. FINDINGS: Evaluation of this exam is limited in the absence of intravenous contrast as well as due to streak artifact caused by spinal hardware. Lower chest: There is mild eventration of the left hemidiaphragm with left lung base atelectasis/scarring. Overall increase in the left perihilar soft tissue component since the prior CT. This is only partially visualized on this exam. Follow-up with chest CT on a nonemergent/outpatient basis recommended additional scattered bibasilar streaky densities noted. Partially seen central venous line with tip at the cavoatrial junction.  Advanced coronary vascular calcification of the LAD. No intra-abdominal free air or free fluid. Hepatobiliary: Small cyst in the right lobe of the liver. No biliary dilatation. The gallbladder is distended. No calcified gallstone or pericholecystic fluid. Pancreas: No acute findings. Spleen: Normal in size without focal abnormality. Adrenals/Urinary Tract: The adrenal glands are unremarkable. There is no hydronephrosis or nephrolithiasis on either side. The visualized ureters appear unremarkable. The urinary bladder is distended. There is hazy appearance of the bladder wall. Correlation with urinalysis recommended to exclude cystitis. Stomach/Bowel: There is large amount of stool throughout the colon. There is diffuse colonic diverticulosis. There is a 3 cm diverticula or diverticular abscess in the sigmoid colon in the left lower quadrant with inflammatory changes and infiltration of the surrounding fat consistent with acute diverticulitis. There is no bowel obstruction. The appendix is not visualized with certainty. No inflammatory changes identified in the right lower quadrant. Vascular/Lymphatic: Advanced aortoiliac atherosclerotic disease. The IVC is unremarkable. No portal venous gas. There is no adenopathy. Reproductive: The uterus is grossly unremarkable. No adnexal masses. Other: None Musculoskeletal: Osteopenia with degenerative changes of the spine. Old L1 compression fracture and vertebroplasty. Multilevel posterior fusion. No acute osseous pathology. IMPRESSION: 1. Sigmoid diverticulitis with a 3 cm diverticula or diverticular abscess in the left lower quadrant. 2. Constipation. No bowel obstruction. 3. No hydronephrosis or nephrolithiasis. 4. Overall increase in the left perihilar soft tissue component since the prior CT. Follow-up with chest CT on a nonemergent/outpatient basis recommended. 5.  Aortic Atherosclerosis (ICD10-I70.0). Electronically Signed   By: Elgie Collard M.D.   On: 08/17/2022  23:53   DG Chest 2 View  Result Date: 08/17/2022 CLINICAL DATA:  Pain after fall.  History of lung cancer. EXAM: CHEST - 2 VIEW COMPARISON:  X-ray 08/06/2022.  CT scan 07/02/2022 FINDINGS: Underinflation. Increasing bandlike opacity left lung base. Atelectasis is favored over infiltrate but recommend follow-up. This has a nodular appearance. No pneumothorax or effusion. Normal cardiopericardial silhouette. Degenerative changes of the spine. Osteopenia. Stable right IJ chest port with tip at the SVC right atrial junction. Fixation hardware along the lower thoracic spine at the edge of the imaging field. IMPRESSION: Increasing left lung base opacity. Atelectasis is favored over infiltrate recommend follow-up. This has a nodular appearance slightly as well. Please correlate with previous CT scan from 07/02/2022. Chest port. Electronically Signed   By: Scarlette Shorts  Chales Abrahams M.D.   On: 08/17/2022 16:14   CT Head Wo Contrast  Result Date: 08/17/2022 CLINICAL DATA:  Patient fell walking to the restroom.  Head injury. EXAM: CT HEAD WITHOUT CONTRAST TECHNIQUE: Contiguous axial images were obtained from the base of the skull through the vertex without intravenous contrast. RADIATION DOSE REDUCTION: This exam was performed according to the departmental dose-optimization program which includes automated exposure control, adjustment of the mA and/or kV according to patient size and/or use of iterative reconstruction technique. COMPARISON:  MRI brain 02/16/2022.  CT head 01/11/2021. FINDINGS: Brain: There is no evidence for acute hemorrhage, hydrocephalus, mass lesion, or abnormal extra-axial fluid collection. No definite CT evidence for acute infarction. Diffuse loss of parenchymal volume is consistent with atrophy. Patchy low attenuation in the deep hemispheric and periventricular white matter is nonspecific, but likely reflects chronic microvascular ischemic demyelination. Vascular: No hyperdense vessel or unexpected  calcification. Skull: No evidence for fracture. No worrisome lytic or sclerotic lesion. Sinuses/Orbits: Chronic mucosal disease noted in the sphenoid sinuses, minimally progressive since 01/11/2021. Visualized portions of the globes and intraorbital fat are unremarkable. Other: None. IMPRESSION: 1. No acute intracranial abnormality. 2. Atrophy with chronic small vessel ischemic disease. 3. Chronic sphenoid sinusitis, minimally progressive since 01/11/2021. Electronically Signed   By: Kennith Center M.D.   On: 08/17/2022 15:07   DG Chest 2 View  Result Date: 08/06/2022 CLINICAL DATA:  Provided history: Shortness of breath. Primary malignant neoplasm of right lung metastatic to other site. EXAM: CHEST - 2 VIEW COMPARISON:  Chest CT 07/02/2022. Prior chest radiographs 05/20/2022 and earlier. FINDINGS: Right chest infusion port catheter with tip at the level of the superior cavoatrial junction. Heart size within normal limits. Aortic atherosclerosis. Small ill-defined opacity within the left lung base, new from the prior chest radiographs of 05/20/2022 (see annotation on image). Ill-defined and bandlike opacities elsewhere within the mid and lower left lung have not significantly changed. Chronic elevation of left hemidiaphragm. No acute airspace consolidation on the right. No evidence of pleural effusion or pneumothorax. Thoracolumbar posterior spinal fusion construct, incompletely imaged. L1 vertebral compression fracture with sequelae of prior vertebral augmentation. IMPRESSION: 1. Small ill-defined opacity within the left lung base, new from the prior chest radiographs of 05/20/2022 and indeterminate in etiology. Consider a chest CT for further evaluation. 2. Bandlike and ill-defined opacities elsewhere within the mid and lower left lung have not significantly changed. Correlate with findings on the prior chest CT of 07/02/2022. 3. Chronic elevation of the left hemidiaphragm. 4.  Aortic Atherosclerosis  (ICD10-I70.0). Electronically Signed   By: Jackey Loge D.O.   On: 08/06/2022 14:02    Microbiology: Recent Results (from the past 240 hour(s))  Blood culture (routine x 2)     Status: None (Preliminary result)   Collection Time: 08/17/22  4:46 PM   Specimen: BLOOD  Result Value Ref Range Status   Specimen Description BLOOD PORTA CATH  Final   Special Requests   Final    BOTTLES DRAWN AEROBIC AND ANAEROBIC Blood Culture adequate volume   Culture   Final    NO GROWTH 2 DAYS Performed at [facility], [address], [place], [place] [address]    Report Status PENDING  Incomplete  Urine Culture (for pregnant, neutropenic or urologic patients or patients with an indwelling urinary catheter)     Status: Abnormal   Collection Time: 08/17/22  4:46 PM   Specimen: Urine, Random  Result Value Ref Range Status   Specimen Description  Final    URINE, RANDOM Performed at [facility], [address] [place], [place] [address]    Special Requests   Final    NONE Performed at [facility], [address] [place], [place] [address]    Culture >=100,000 COLONIES/mL PSEUDOMONAS AERUGINOSA (A)  Final   Report Status 08/19/2022 FINAL  Final   Organism ID, Bacteria PSEUDOMONAS AERUGINOSA (A)  Final      Susceptibility   Pseudomonas aeruginosa - MIC*    CEFTAZIDIME 4 SENSITIVE Sensitive     CIPROFLOXACIN <=0.25 SENSITIVE Sensitive     GENTAMICIN <=1 SENSITIVE Sensitive     IMIPENEM 2 SENSITIVE Sensitive     PIP/TAZO <=4 SENSITIVE Sensitive     CEFEPIME 4 SENSITIVE Sensitive     * >=100,000 COLONIES/mL PSEUDOMONAS AERUGINOSA  Blood culture (routine x 2)     Status: None (Preliminary result)   Collection Time: 08/17/22  5:12 PM   Specimen: BLOOD  Result Value Ref Range Status   Specimen Description BLOOD BLOOD RIGHT FOREARM  Final   Special Requests   Final    BOTTLES DRAWN AEROBIC ONLY Blood Culture adequate volume   Culture   Final    NO GROWTH 2  DAYS Performed at [facility], [address] [place], [place] [address]    Report Status PENDING  Incomplete     Labs: CBC: Recent Labs  Lab 08/17/22 1446 08/18/22 0900 08/19/22 0633  WBC 16.0* 11.1* 9.4  HGB 10.2* 9.2* 8.6*  HCT 33.3* 29.4* 27.5*  MCV 99.7 99.0 100.0  PLT 275 237 228   Basic Metabolic Panel: Recent Labs  Lab 08/17/22 1446 08/18/22 0900 08/19/22 0633  NA 143 145 146*  K 4.4 4.3 4.1  CL 110 113* 114*  CO2 22 23 24   GLUCOSE 139* 142* 132*  BUN 46* 44* 32*  CREATININE 2.25*  2.18* 1.93* 1.65*  CALCIUM 8.8* 8.2* 8.2*  MG  --  1.9 2.0  PHOS  --  3.2 3.4   Liver Function Tests: Recent Labs  Lab 08/17/22 1446 08/18/22 0900  AST 29 24  23   ALT 19 15  16   ALKPHOS 92 83  84  BILITOT 0.4 0.5  0.6  PROT 7.0 6.6  6.7  ALBUMIN 2.9* 2.8*  2.8*   Recent Labs  Lab 08/17/22 1446  LIPASE 21   No results for input(s): "AMMONIA" in the last 168 hours. Cardiac Enzymes: No results for input(s): "CKTOTAL", "CKMB", "CKMBINDEX", "TROPONINI" in the last 168 hours. BNP (last 3 results) No results for input(s): "BNP" in the last 8760 hours. CBG: Recent Labs  Lab 08/18/22 1136 08/18/22 1647 08/18/22 2226 08/19/22 0736 08/19/22 1119  GLUCAP 156* 120* 128* 137* 125*    Time spent: 35 minutes  Signed:  Gillis Santa  [facility]  08/19/2022 3:18 PM

## 2022-08-19 NOTE — Progress Notes (Signed)
Discharged. AVS printed and reviewed with patient and daughter. All belonging gathered. Transportation home with daughter.

## 2022-08-19 NOTE — Progress Notes (Signed)
Mobility Specialist - Progress Note   08/19/22 1002  Mobility  Activity Stood at bedside;Dangled on edge of bed;Ambulated independently in hallway  Level of Assistance Independent  Assistive Device None  Distance Ambulated (ft) 180 ft  Activity Response Tolerated well  Mobility Referral Yes  $Mobility charge 1 Mobility  Mobility Specialist Start Time (ACUTE ONLY) [id number]  Mobility Specialist Stop Time (ACUTE ONLY) 0954  Mobility Specialist Time Calculation (min) (ACUTE ONLY) 12 min   Pt supine in bed on RA upon arrival. Pt STS and ambulates in hallway indep with no LOB noted. Pt returns to bed with needs in reach and family present.   [name]  Mobility Specialist  08/19/22 10:03 AM

## 2022-08-20 ENCOUNTER — Inpatient Hospital Stay: Payer: Medicare HMO

## 2022-08-20 ENCOUNTER — Inpatient Hospital Stay: Payer: Medicare HMO | Admitting: Oncology

## 2022-08-20 LAB — CULTURE, BLOOD (ROUTINE X 2): Special Requests: ADEQUATE

## 2022-08-21 LAB — CULTURE, BLOOD (ROUTINE X 2): Culture: NO GROWTH

## 2022-08-22 LAB — CULTURE, BLOOD (ROUTINE X 2)
Culture: NO GROWTH
Special Requests: ADEQUATE

## 2022-08-24 ENCOUNTER — Inpatient Hospital Stay (HOSPITAL_BASED_OUTPATIENT_CLINIC_OR_DEPARTMENT_OTHER): Payer: Medicare HMO | Admitting: Hospice and Palliative Medicine

## 2022-08-24 ENCOUNTER — Encounter: Payer: Medicare HMO | Admitting: Speech Pathology

## 2022-08-24 ENCOUNTER — Other Ambulatory Visit: Payer: Self-pay | Admitting: *Deleted

## 2022-08-24 DIAGNOSIS — C3491 Malignant neoplasm of unspecified part of right bronchus or lung: Secondary | ICD-10-CM

## 2022-08-24 MED ORDER — XTAMPZA ER 18 MG PO C12A: 1.0000 | EXTENDED_RELEASE_CAPSULE | Freq: Two times a day (BID) | ORAL | 0 refills | Status: DC

## 2022-08-24 MED ORDER — OXYCODONE HCL 20 MG PO TABS: 10.0000 mg | ORAL_TABLET | ORAL | 0 refills | Status: DC | PRN

## 2022-08-24 NOTE — Progress Notes (Signed)
VM left. Will reschedule.

## 2022-08-25 ENCOUNTER — Encounter: Payer: Medicare HMO | Admitting: Speech Pathology

## 2022-08-26 ENCOUNTER — Institutional Professional Consult (permissible substitution): Payer: Medicare HMO | Admitting: Student in an Organized Health Care Education/Training Program

## 2022-08-26 MED FILL — Dexamethasone Sodium Phosphate Inj 100 MG/10ML: INTRAMUSCULAR | Qty: 1 | Status: AC

## 2022-08-27 ENCOUNTER — Inpatient Hospital Stay (HOSPITAL_BASED_OUTPATIENT_CLINIC_OR_DEPARTMENT_OTHER): Payer: Medicare HMO | Admitting: Oncology

## 2022-08-27 ENCOUNTER — Inpatient Hospital Stay: Payer: Medicare HMO

## 2022-08-27 ENCOUNTER — Encounter: Payer: Self-pay | Admitting: Oncology

## 2022-08-27 VITALS — BP 122/56 | HR 98 | Temp 97.6°F | Resp 20 | Wt 112.1 lb

## 2022-08-27 DIAGNOSIS — C3491 Malignant neoplasm of unspecified part of right bronchus or lung: Secondary | ICD-10-CM

## 2022-08-27 DIAGNOSIS — G893 Neoplasm related pain (acute) (chronic): Secondary | ICD-10-CM | POA: Diagnosis not present

## 2022-08-27 DIAGNOSIS — Z86711 Personal history of pulmonary embolism: Secondary | ICD-10-CM | POA: Diagnosis not present

## 2022-08-27 DIAGNOSIS — Z5112 Encounter for antineoplastic immunotherapy: Secondary | ICD-10-CM

## 2022-08-27 DIAGNOSIS — Z7901 Long term (current) use of anticoagulants: Secondary | ICD-10-CM | POA: Diagnosis not present

## 2022-08-27 DIAGNOSIS — C7951 Secondary malignant neoplasm of bone: Secondary | ICD-10-CM | POA: Diagnosis not present

## 2022-08-27 DIAGNOSIS — C770 Secondary and unspecified malignant neoplasm of lymph nodes of head, face and neck: Secondary | ICD-10-CM | POA: Diagnosis not present

## 2022-08-27 DIAGNOSIS — Z87891 Personal history of nicotine dependence: Secondary | ICD-10-CM | POA: Diagnosis not present

## 2022-08-27 DIAGNOSIS — R7989 Other specified abnormal findings of blood chemistry: Secondary | ICD-10-CM

## 2022-08-27 DIAGNOSIS — Z79620 Long term (current) use of immunosuppressive biologic: Secondary | ICD-10-CM | POA: Diagnosis not present

## 2022-08-27 DIAGNOSIS — N179 Acute kidney failure, unspecified: Secondary | ICD-10-CM | POA: Diagnosis not present

## 2022-08-27 LAB — CBC WITH DIFFERENTIAL/PLATELET
Abs Immature Granulocytes: 0.45 10*3/uL — ABNORMAL HIGH (ref 0.00–0.07)
Basophils Absolute: 0.1 10*3/uL (ref 0.0–0.1)
Basophils Relative: 1 %
Eosinophils Absolute: 0 10*3/uL (ref 0.0–0.5)
Eosinophils Relative: 0 %
HCT: 33.3 % — ABNORMAL LOW (ref 36.0–46.0)
Hemoglobin: 10.2 g/dL — ABNORMAL LOW (ref 12.0–15.0)
Immature Granulocytes: 5 %
Lymphocytes Relative: 13 %
Lymphs Abs: 1.2 10*3/uL (ref 0.7–4.0)
MCH: 31 pg (ref 26.0–34.0)
MCHC: 30.6 g/dL (ref 30.0–36.0)
MCV: 101.2 fL — ABNORMAL HIGH (ref 80.0–100.0)
Monocytes Absolute: 0.6 10*3/uL (ref 0.1–1.0)
Monocytes Relative: 7 %
Neutro Abs: 7 10*3/uL (ref 1.7–7.7)
Neutrophils Relative %: 74 %
Platelets: 249 10*3/uL (ref 150–400)
RBC: 3.29 MIL/uL — ABNORMAL LOW (ref 3.87–5.11)
RDW: 19.7 % — ABNORMAL HIGH (ref 11.5–15.5)
WBC: 9.3 10*3/uL (ref 4.0–10.5)
nRBC: 0 % (ref 0.0–0.2)

## 2022-08-27 LAB — COMPREHENSIVE METABOLIC PANEL
ALT: 13 U/L (ref 0–44)
AST: 29 U/L (ref 15–41)
Albumin: 3.1 g/dL — ABNORMAL LOW (ref 3.5–5.0)
Alkaline Phosphatase: 72 U/L (ref 38–126)
Anion gap: 8 (ref 5–15)
BUN: 29 mg/dL — ABNORMAL HIGH (ref 8–23)
CO2: 23 mmol/L (ref 22–32)
Calcium: 9.3 mg/dL (ref 8.9–10.3)
Chloride: 115 mmol/L — ABNORMAL HIGH (ref 98–111)
Creatinine, Ser: 1.47 mg/dL — ABNORMAL HIGH (ref 0.44–1.00)
GFR, Estimated: 37 mL/min — ABNORMAL LOW (ref >60)
Glucose, Bld: 184 mg/dL — ABNORMAL HIGH (ref 70–99)
Potassium: 4.2 mmol/L (ref 3.5–5.1)
Sodium: 146 mmol/L — ABNORMAL HIGH (ref 135–145)
Total Bilirubin: 0.3 mg/dL (ref 0.3–1.2)
Total Protein: 7.3 g/dL (ref 6.5–8.1)

## 2022-08-27 MED ORDER — SODIUM CHLORIDE 0.9 % IV SOLN
Freq: Once | INTRAVENOUS | Status: AC
  Filled 2022-08-27: qty 250

## 2022-08-27 MED ORDER — SODIUM CHLORIDE 0.9 % IV SOLN
200.0000 mg | Freq: Once | INTRAVENOUS | Status: AC
  Administered 2022-08-27: 200 mg via INTRAVENOUS
  Filled 2022-08-27: qty 8

## 2022-08-27 MED ORDER — HEPARIN SOD (PORK) LOCK FLUSH 100 UNIT/ML IV SOLN
500.0000 [IU] | Freq: Once | INTRAVENOUS | Status: AC | PRN
  Administered 2022-08-27: 500 [IU]
  Filled 2022-08-27: qty 5

## 2022-08-27 NOTE — Progress Notes (Signed)
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  She smoked about 2 packs of cigarettes per day but quit smoking in 2010.  She presented to the ER withSymptoms of significant back pain MRIs lumbar spine without contrast showed pathologic fracture of the L1 vertebral body with posterior bulging of the cortex resulting in mild spinal canal stenosis.  Diffuse T1 hypointense abnormality in the L1 vertebral body with involvement of bilateral pedicles and transverse processes.  CT chest abdomen and pelvis with contrast showed 1 cm left supraclavicular adenopathy.  1 cm right subcarinal lymph node.  Necrotic 3.1 x 2.4 x 2.9 cm anterior mediastinal mass in the left prevascular region with mild mass effect on the brachiocephalic vein.  Possible phrenic nerve involvement.  Mild acute sigmoid colon diverticulitis.    Patient had biopsy of the left supraclavicular lymph node which was consistent with adenocarcinoma of lung primary.  NGS testing did not show any actionable mutations.  K-ras G12 D mutation detected.  MSI stable.  Patient underwent percutaneous fixation of the L1 vertebral body.  She completed palliative radiation treatment to her spine    Interval history-patient was admitted to the hospital recently for an episode of diverticulitis for which she is currently on oral antibiotics.  She feels better overall.  Energy levels are improving.  No recent falls  ECOG PS-  1 Pain scale- 0   Review of systems- Review of Systems  Constitutional:  Positive for malaise/fatigue. Negative for chills, fever and weight loss.  HENT:  Negative for congestion, ear discharge and nosebleeds.   Eyes:  Negative for blurred vision.  Respiratory:  Negative for cough, hemoptysis, sputum production, shortness of breath and wheezing.   Cardiovascular:  Negative for chest pain, palpitations, orthopnea and claudication.  Gastrointestinal:  Negative for abdominal pain, blood in stool, constipation, diarrhea, heartburn, melena, nausea and vomiting.  Genitourinary:  Negative for dysuria, flank pain, frequency, hematuria and urgency.  Musculoskeletal:  Negative for back pain, joint pain and myalgias.  Skin:  Negative for rash.  Neurological:  Negative for dizziness, tingling, focal weakness, seizures, weakness and headaches.  Endo/Heme/Allergies:  Does not bruise/bleed easily.  Psychiatric/Behavioral:  Negative for depression and suicidal ideas. The patient does not have insomnia.       No Known Allergies   Past Medical History:  Diagnosis Date   Arthritis    hands and feet   Cancer associated pain    Cataract    right   Complication of anesthesia    sometimes have a hard time waking up   Diabetes mellitus    Borderline Diabetes   GERD (gastroesophageal reflux disease)    HTN (hypertension)    Hyperlipidemia    Lung cancer (HCC)    Motion sickness    Post corneal transplant    right     Past Surgical History:  Procedure Laterality Date   APPLICATION OF INTRAOPERATIVE CT SCAN  02/15/2022   Procedure: APPLICATION OF INTRAOPERATIVE CT SCAN;  Surgeon: Myer Haff,  Annia Friendly, MD;  Location: [facility] ORS;  Service: Neurosurgery;;   BASAL CELL CARCINOMA EXCISION     Dr. Lorn Junes   CARPAL TUNNEL RELEASE     right   COLONOSCOPY WITH PROPOFOL N/A 12/03/2015   Procedure: COLONOSCOPY WITH PROPOFOL;  Surgeon: Scot Jun, MD;  Location: [facility] END[facility]COPY;  Service: Endoscopy;   Laterality: N/A;   COLONOSCOPY WITH PROPOFOL N/A 02/22/2020   Procedure: COLONOSCOPY WITH PROPOFOL;  Surgeon: Regis Bill, MD;  Location: [facility] ENDOSCOPY;  Service: Endoscopy;  Laterality: N/A;   ESOPHAGOGASTRODUODENOSCOPY (EGD) WITH PROPOFOL N/A 02/22/2020   Procedure: ESOPHAGOGASTRODUODENOSCOPY (EGD) WITH PROPOFOL;  Surgeon: Regis Bill, MD;  Location: [facility] ENDOSCOPY;  Service: Endoscopy;  Laterality: N/A;   ESOPHAGOGASTRODUODENOSCOPY (EGD) WITH PROPOFOL N/A 06/07/2022   Procedure: ESOPHAGOGASTRODUODENOSCOPY (EGD) WITH PROPOFOL;  Surgeon: Regis Bill, MD;  Location: [facility] ENDOSCOPY;  Service: Endoscopy;  Laterality: N/A;   HAMMER TOE SURGERY Right 05/23/2019   Procedure: HAMMER TOE CORRECTION;  Surgeon: Gwyneth Revels, DPM;  Location: [facility];  Service: Podiatry;  Laterality: Right;  Diabetic   IR BONE TUMOR(S)RF ABLATION  04/30/2022   IR IMAGING GUIDED PORT INSERTION  04/14/2022   IR KYPHO LUMBAR INC FX REDUCE BONE BX UNI/BIL CANNULATION INC/IMAGING  04/30/2022   IR RADIOLOGIST EVAL & MGMT  03/24/2022   IR RADIOLOGIST EVAL & MGMT  06/23/2022   OSTECTOMY Right 05/23/2019   Procedure: DOUBLE OSTEOTOMY RIGHT;  Surgeon: Gwyneth Revels, DPM;  Location: [facility];  Service: Podiatry;  Laterality: Right;   SKIN CANCER EXCISION      Social History   Socioeconomic History   Marital status: Widowed    Spouse name: Not on file   Number of children: Not on file   Years of education: Not on file   Highest education level: Not on file  Occupational History    Employer: [employer]    Comment: Currently not employed  Tobacco Use   Smoking status: Former    Types: Cigarettes    Quit date: 01/13/2009    Years since quitting: 13.6   Smokeless tobacco: Never  Vaping Use   Vaping Use: Never used  Substance and Sexual Activity   Alcohol use: No   Drug use: No   Sexual activity: Not Currently    Birth control/protection: Post-menopausal  Other Topics Concern    Not on file  Social History Narrative   Not on file   Social Determinants of Health   Financial Resource Strain: Not on file  Food Insecurity: No Food Insecurity (08/18/2022)   Hunger Vital Sign    Worried About Running Out of Food in the Last Year: Never true    Ran Out of Food in the Last Year: Never true  Transportation Needs: No Transportation Needs (08/18/2022)   PRAPARE - Administrator, Civil Service (Medical): No    Lack of Transportation (Non-Medical): No  Physical Activity: Not on file  Stress: Not on file  Social Connections: Not on file  Intimate Partner Violence: Not At Risk (08/18/2022)   Humiliation, Afraid, Rape, and Kick questionnaire    Fear of Current or Ex-Partner: No    Emotionally Abused: No    Physically Abused: No    Sexually Abused: No    Family History  Problem Relation Age of Onset   Heart disease Mother    Heart disease Father    Heart disease Maternal Grandmother    Heart disease Maternal Grandfather    Breast cancer Sister 64  Current Outpatient Medications:    acetaminophen (TYLENOL) 500 MG tablet, Take 500 mg by mouth every 6 (six) hours as needed for moderate pain, mild pain, fever or headache., Disp: , Rfl:    Ascorbic Acid (VITAMIN C) 1000 MG tablet, Take 1,000 mg by mouth daily.  , Disp: , Rfl:    carvedilol (COREG) 3.125 MG tablet, Take 3.125 mg by mouth 2 (two) times daily with a meal., Disp: , Rfl:    ciprofloxacin (CIPRO) 500 MG tablet, Take 1 tablet (500 mg total) by mouth 2 (two) times daily for 10 days., Disp: 20 tablet, Rfl: 0   cyclobenzaprine (FLEXERIL) 5 MG tablet, Take 1 tablet (5 mg total) by mouth 3 (three) times daily as needed for muscle spasms., Disp: 30 tablet, Rfl: 0   dexamethasone (DECADRON) 4 MG tablet, TAKE 1 TABLET 2 TIMES DAILY STARTING DAY BEFORE PEMETREXED. THEN TAKE 2 TABS DAILY FOR 3 DAYS STARTING DAY AFTER CARBOPLATIN. TAKE WITH FOOD., Disp: 30 tablet, Rfl: 1   Docusate Calcium (STOOL SOFTENER PO),  Take 1 capsule by mouth daily as needed (constipation)., Disp: , Rfl:    ELIQUIS 5 MG TABS tablet, TAKE 1 TABLET BY MOUTH TWICE A DAY, Disp: 60 tablet, Rfl: 2   folic acid (FOLVITE) 1 MG tablet, Take 1 tablet (1 mg total) by mouth daily. Start 7 days before pemetrexed chemotherapy. Continue until 21 days after pemetrexed completed., Disp: 100 tablet, Rfl: 3   gabapentin (NEURONTIN) 100 MG capsule, Take 100 mg by mouth 2 (two) times daily., Disp: , Rfl:    lidocaine-prilocaine (EMLA) cream, Apply 1 Application topically as needed., Disp: 30 g, Rfl: 2   LORazepam (ATIVAN) 0.5 MG tablet, Take 1 tablet (0.5 mg total) by mouth every 8 (eight) hours as needed for anxiety., Disp: 30 tablet, Rfl: 0   metFORMIN (GLUCOPHAGE) 500 MG tablet, Take 1 tablet (500 mg total) by mouth 2 (two) times daily with a meal., Disp: , Rfl:    metroNIDAZOLE (FLAGYL) 500 MG tablet, Take 1 tablet (500 mg total) by mouth 3 (three) times daily for 10 days., Disp: 30 tablet, Rfl: 0   Multiple Vitamin (MULTIVITAMIN) capsule, Take 1 capsule by mouth daily.  , Disp: , Rfl:    OLANZapine (ZYPREXA) 10 MG tablet, TAKE 1 TABLET BY MOUTH EVERYDAY AT BEDTIME (Patient taking differently: Take 10 mg by mouth at bedtime.), Disp: 90 tablet, Rfl: 1   omeprazole (PRILOSEC) 20 MG capsule, Take 1 capsule (20 mg total) by mouth daily., Disp: 90 capsule, Rfl: 3   ondansetron (ZOFRAN) 8 MG tablet, Take 1 tablet (8 mg total) by mouth every 8 (eight) hours as needed for nausea or vomiting. Start on the third day after carboplatin., Disp: 30 tablet, Rfl: 1   oxyCODONE ER (XTAMPZA ER) 18 MG C12A, Take 1 capsule by mouth every 12 (twelve) hours., Disp: 60 capsule, Rfl: 0   Oxycodone HCl 20 MG TABS, Take 0.5-1 tablets (10-20 mg total) by mouth every 4 (four) hours as needed., Disp: 60 tablet, Rfl: 0   polyethylene glycol (MIRALAX / GLYCOLAX) 17 g packet, Take 17 g by mouth daily., Disp: 14 each, Rfl: 0   prednisoLONE acetate (PRED MILD) 0.12 % ophthalmic  suspension, Place 1 drop into both eyes daily., Disp: , Rfl:    prochlorperazine (COMPAZINE) 10 MG tablet, Take 1 tablet (10 mg total) by mouth every 6 (six) hours as needed for nausea or vomiting., Disp: 30 tablet, Rfl: 2   simvastatin (ZOCOR) 40 MG tablet, TAKE  1 TABLET EVERY DAY  AT  6:00 PM, Disp: 90 tablet, Rfl: 3 No current facility-administered medications for this visit.  Facility-Administered Medications Ordered in Other Visits:    0.9 %  sodium chloride infusion, , Intravenous, Continuous, Borders, Daryl Eastern, NP, Stopped at 08/09/22 1434  Physical exam:  Vitals:   08/27/22 0917  BP: (!) 122/56  Pulse: 98  Resp: 20  Temp: 97.6 F (36.4 C)  SpO2: 100%  Weight: 112 lb 1.6 oz (50.8 kg)   Physical Exam Cardiovascular:     Rate and Rhythm: Normal rate and regular rhythm.     Heart sounds: Normal heart sounds.  Pulmonary:     Effort: Pulmonary effort is normal.     Breath sounds: Normal breath sounds.  Abdominal:     General: Bowel sounds are normal. There is no distension.     Palpations: Abdomen is soft.     Tenderness: There is no abdominal tenderness.  Skin:    General: Skin is warm and dry.  Neurological:     Mental Status: She is alert and oriented to person, place, and time.         Latest Ref Rng & Units 08/27/2022    9:03 AM  CMP  Glucose 70 - 99 mg/dL 161   BUN 8 - 23 mg/dL 29   Creatinine 0.96 - 1.00 mg/dL 0.45   Sodium 409 - 811 mmol/L 146   Potassium 3.5 - 5.1 mmol/L 4.2   Chloride 98 - 111 mmol/L 115   CO2 22 - 32 mmol/L 23   Calcium 8.9 - 10.3 mg/dL 9.3   Total Protein 6.5 - 8.1 g/dL 7.3   Total Bilirubin 0.3 - 1.2 mg/dL 0.3   Alkaline Phos 38 - 126 U/L 72   AST 15 - 41 U/L 29   ALT 0 - 44 U/L 13       Latest Ref Rng & Units 08/27/2022    9:03 AM  CBC  WBC 4.0 - 10.5 K/uL 9.3   Hemoglobin 12.0 - 15.0 g/dL 91.4   Hematocrit 78.2 - 46.0 % 33.3   Platelets 150 - 400 K/uL 249     No images are attached to the encounter.  CT ABDOMEN PELVIS  WO CONTRAST  Result Date: 08/17/2022 CLINICAL DATA:  Left lower quadrant abdominal pain. EXAM: CT ABDOMEN AND PELVIS WITHOUT CONTRAST TECHNIQUE: Multidetector CT imaging of the abdomen and pelvis was performed following the standard protocol without IV contrast. RADIATION DOSE REDUCTION: This exam was performed according to the departmental dose-optimization program which includes automated exposure control, adjustment of the mA and/or kV according to patient size and/or use of iterative reconstruction technique. COMPARISON:  CT dated 07/02/2022. FINDINGS: Evaluation of this exam is limited in the absence of intravenous contrast as well as due to streak artifact caused by spinal hardware. Lower chest: There is mild eventration of the left hemidiaphragm with left lung base atelectasis/scarring. Overall increase in the left perihilar soft tissue component since the prior CT. This is only partially visualized on this exam. Follow-up with chest CT on a nonemergent/outpatient basis recommended additional scattered bibasilar streaky densities noted. Partially seen central venous line with tip at the cavoatrial junction. Advanced coronary vascular calcification of the LAD. No intra-abdominal free air or free fluid. Hepatobiliary: Small cyst in the right lobe of the liver. No biliary dilatation. The gallbladder is distended. No calcified gallstone or pericholecystic fluid. Pancreas: No acute findings. Spleen: Normal in size without focal abnormality. Adrenals/Urinary Tract: The  adrenal glands are unremarkable. There is no hydronephrosis or nephrolithiasis on either side. The visualized ureters appear unremarkable. The urinary bladder is distended. There is hazy appearance of the bladder wall. Correlation with urinalysis recommended to exclude cystitis. Stomach/Bowel: There is large amount of stool throughout the colon. There is diffuse colonic diverticulosis. There is a 3 cm diverticula or diverticular abscess in the  sigmoid colon in the left lower quadrant with inflammatory changes and infiltration of the surrounding fat consistent with acute diverticulitis. There is no bowel obstruction. The appendix is not visualized with certainty. No inflammatory changes identified in the right lower quadrant. Vascular/Lymphatic: Advanced aortoiliac atherosclerotic disease. The IVC is unremarkable. No portal venous gas. There is no adenopathy. Reproductive: The uterus is grossly unremarkable. No adnexal masses. Other: None Musculoskeletal: Osteopenia with degenerative changes of the spine. Old L1 compression fracture and vertebroplasty. Multilevel posterior fusion. No acute osseous pathology. IMPRESSION: 1. Sigmoid diverticulitis with a 3 cm diverticula or diverticular abscess in the left lower quadrant. 2. Constipation. No bowel obstruction. 3. No hydronephrosis or nephrolithiasis. 4. Overall increase in the left perihilar soft tissue component since the prior CT. Follow-up with chest CT on a nonemergent/outpatient basis recommended. 5.  Aortic Atherosclerosis (ICD10-I70.0). Electronically Signed   By: Elgie Collard M.D.   On: 08/17/2022 23:53   DG Chest 2 View  Result Date: 08/17/2022 CLINICAL DATA:  Pain after fall.  History of lung cancer. EXAM: CHEST - 2 VIEW COMPARISON:  X-ray 08/06/2022.  CT scan 07/02/2022 FINDINGS: Underinflation. Increasing bandlike opacity left lung base. Atelectasis is favored over infiltrate but recommend follow-up. This has a nodular appearance. No pneumothorax or effusion. Normal cardiopericardial silhouette. Degenerative changes of the spine. Osteopenia. Stable right IJ chest port with tip at the SVC right atrial junction. Fixation hardware along the lower thoracic spine at the edge of the imaging field. IMPRESSION: Increasing left lung base opacity. Atelectasis is favored over infiltrate recommend follow-up. This has a nodular appearance slightly as well. Please correlate with previous CT scan from  07/02/2022. Chest port. Electronically Signed   By: Karen Kays M.D.   On: 08/17/2022 16:14   CT Head Wo Contrast  Result Date: 08/17/2022 CLINICAL DATA:  Patient fell walking to the restroom.  Head injury. EXAM: CT HEAD WITHOUT CONTRAST TECHNIQUE: Contiguous axial images were obtained from the base of the skull through the vertex without intravenous contrast. RADIATION DOSE REDUCTION: This exam was performed according to the departmental dose-optimization program which includes automated exposure control, adjustment of the mA and/or kV according to patient size and/or use of iterative reconstruction technique. COMPARISON:  MRI brain 02/16/2022.  CT head 01/11/2021. FINDINGS: Brain: There is no evidence for acute hemorrhage, hydrocephalus, mass lesion, or abnormal extra-axial fluid collection. No definite CT evidence for acute infarction. Diffuse loss of parenchymal volume is consistent with atrophy. Patchy low attenuation in the deep hemispheric and periventricular white matter is nonspecific, but likely reflects chronic microvascular ischemic demyelination. Vascular: No hyperdense vessel or unexpected calcification. Skull: No evidence for fracture. No worrisome lytic or sclerotic lesion. Sinuses/Orbits: Chronic mucosal disease noted in the sphenoid sinuses, minimally progressive since 01/11/2021. Visualized portions of the globes and intraorbital fat are unremarkable. Other: None. IMPRESSION: 1. No acute intracranial abnormality. 2. Atrophy with chronic small vessel ischemic disease. 3. Chronic sphenoid sinusitis, minimally progressive since 01/11/2021. Electronically Signed   By: Kennith Center M.D.   On: 08/17/2022 15:07   DG Chest 2 View  Result Date: 08/06/2022 CLINICAL DATA:  Provided history:  Shortness of breath. Primary malignant neoplasm of right lung metastatic to other site. EXAM: CHEST - 2 VIEW COMPARISON:  Chest CT 07/02/2022. Prior chest radiographs 05/20/2022 and earlier. FINDINGS: Right chest  infusion port catheter with tip at the level of the superior cavoatrial junction. Heart size within normal limits. Aortic atherosclerosis. Small ill-defined opacity within the left lung base, new from the prior chest radiographs of 05/20/2022 (see annotation on image). Ill-defined and bandlike opacities elsewhere within the mid and lower left lung have not significantly changed. Chronic elevation of left hemidiaphragm. No acute airspace consolidation on the right. No evidence of pleural effusion or pneumothorax. Thoracolumbar posterior spinal fusion construct, incompletely imaged. L1 vertebral compression fracture with sequelae of prior vertebral augmentation. IMPRESSION: 1. Small ill-defined opacity within the left lung base, new from the prior chest radiographs of 05/20/2022 and indeterminate in etiology. Consider a chest CT for further evaluation. 2. Bandlike and ill-defined opacities elsewhere within the mid and lower left lung have not significantly changed. Correlate with findings on the prior chest CT of 07/02/2022. 3. Chronic elevation of the left hemidiaphragm. 4.  Aortic Atherosclerosis (ICD10-I70.0). Electronically Signed   By: Jackey Loge D.O.   On: 08/06/2022 14:02     Assessment and plan- Patient is a 72 y.o. female  with metastatic lung cancer with bone metastases.  She is here for on treatment assessment prior to cycle 3 of maintenance Alimta and Keytruda  Patient is recovering from her bout of recent diverticulitis.  She still has about 3 more days of oral antibiotics remaining.  I will hold off on giving her Alimta today and proceed with Keytruda alone today.  I will see her back in 3 weeks for cycle 4 of maintenance Alimta and Keytruda  Patient receives B12 injections every 9 weeks.  History of pulmonary embolism: Continue Eliquis  Neoplasm related pain: Continue Xtampza ER and as needed oxycodone  AKI: Overall improving.  She will receive 1 more liter of IV fluids today   Visit  Diagnosis 1. Encounter for antineoplastic immunotherapy   2. Primary malignant neoplasm of right lung metastatic to other site [facility])      Dr. Owens Shark, MD, MPH [facility] [phone] 08/27/2022 12:57 PM

## 2022-08-27 NOTE — Patient Instructions (Signed)
[facility] [place] [facility]  Discharge Instructions: Thank you for choosing [facility] to provide your oncology and hematology care.  If you have a lab appointment with the Cancer Center, please go directly to the Cancer Center and check in at the registration area.  Wear comfortable clothing and clothing appropriate for easy access to any Portacath or PICC line.   We strive to give you quality time with your provider. You may need to reschedule your appointment if you arrive late (15 or more minutes).  Arriving late affects you and other patients whose appointments are after yours.  Also, if you miss three or more appointments without notifying the office, you may be dismissed from the clinic at the provider's discretion.      For prescription refill requests, have your pharmacy contact our office and allow 72 hours for refills to be completed.    Today you received the following chemotherapy and/or immunotherapy agents- Keytruda      To help prevent nausea and vomiting after your treatment, we encourage you to take your nausea medication as directed.  BELOW ARE SYMPTOMS THAT SHOULD BE REPORTED IMMEDIATELY: *FEVER GREATER THAN 100.4 F (38 C) OR HIGHER *CHILLS OR SWEATING *NAUSEA AND VOMITING THAT IS NOT CONTROLLED WITH YOUR NAUSEA MEDICATION *UNUSUAL SHORTNESS OF BREATH *UNUSUAL BRUISING OR BLEEDING *URINARY PROBLEMS (pain or burning when urinating, or frequent urination) *BOWEL PROBLEMS (unusual diarrhea, constipation, pain near the anus) TENDERNESS IN MOUTH AND THROAT WITH OR WITHOUT PRESENCE OF ULCERS (sore throat, sores in mouth, or a toothache) UNUSUAL RASH, SWELLING OR PAIN  UNUSUAL VAGINAL DISCHARGE OR ITCHING   Items with * indicate a potential emergency and should be followed up as soon as possible or go to the Emergency Department if any problems should occur.  Please show the CHEMOTHERAPY ALERT CARD or IMMUNOTHERAPY ALERT CARD at check-in to  the Emergency Department and triage nurse.  Should you have questions after your visit or need to cancel or reschedule your appointment, please contact [facility] [place] [facility]  [phone] and follow the prompts.  Office hours are 8:00 a.m. to 4:30 p.m. Monday - Friday. Please note that voicemails left after 4:00 p.m. may not be returned until the following business day.  We are closed weekends and major holidays. You have access to a nurse at all times for urgent questions. Please call the main number to the clinic [phone] and follow the prompts.  For any non-urgent questions, you may also contact your provider using MyChart. We now offer e-Visits for anyone 18 and older to request care online for non-urgent symptoms. For details visit [email or web address].   Also download the MyChart app! Go to the app store, search "MyChart", open the app, select [facility], and log in with your MyChart username and password.   

## 2022-08-30 ENCOUNTER — Ambulatory Visit: Payer: Medicare HMO | Admitting: Speech Pathology

## 2022-08-30 DIAGNOSIS — R1313 Dysphagia, pharyngeal phase: Secondary | ICD-10-CM | POA: Diagnosis present

## 2022-08-30 DIAGNOSIS — R1312 Dysphagia, oropharyngeal phase: Secondary | ICD-10-CM | POA: Diagnosis present

## 2022-08-30 DIAGNOSIS — R490 Dysphonia: Secondary | ICD-10-CM

## 2022-08-30 NOTE — Therapy (Signed)
OUTPATIENT SPEECH LANGUAGE PATHOLOGY TREATMENT NOTE   Patient Name: Amber Glenn MRN: [id number] DOB:1951/02/20, 72 y.o., female Today's Date: 08/30/2022  PCP: Joen Laura, MD REFERRING PROVIDER: Linus Salmons, MD       End of Session - 08/30/22 1403     Visit Number 9    Number of Visits 17    Date for SLP Re-Evaluation 09/15/22    Authorization Type [employer]    Progress Note Due on Visit 10    SLP Start Time 1400    SLP Stop Time  1440    SLP Time Calculation (min) 40 min    Activity Tolerance Patient tolerated treatment well             Past Medical History:  Diagnosis Date   Arthritis    hands and feet   Cancer associated pain    Cataract    right   Complication of anesthesia    sometimes have a hard time waking up   Diabetes mellitus    Borderline Diabetes   GERD (gastroesophageal reflux disease)    HTN (hypertension)    Hyperlipidemia    Lung cancer (HCC)    Motion sickness    Post corneal transplant    right   Past Surgical History:  Procedure Laterality Date   APPLICATION OF INTRAOPERATIVE CT SCAN  02/15/2022   Procedure: APPLICATION OF INTRAOPERATIVE CT SCAN;  Surgeon: Venetia Night, MD;  Location: [facility] ORS;  Service: Neurosurgery;;   BASAL CELL CARCINOMA EXCISION     Dr. Lorn Junes   CARPAL TUNNEL RELEASE     right   COLONOSCOPY WITH PROPOFOL N/A 12/03/2015   Procedure: COLONOSCOPY WITH PROPOFOL;  Surgeon: Scot Jun, MD;  Location: [facility] ENDOSCOPY;  Service: Endoscopy;  Laterality: N/A;   COLONOSCOPY WITH PROPOFOL N/A 02/22/2020   Procedure: COLONOSCOPY WITH PROPOFOL;  Surgeon: Regis Bill, MD;  Location: [facility] ENDOSCOPY;  Service: Endoscopy;  Laterality: N/A;   ESOPHAGOGASTRODUODENOSCOPY (EGD) WITH PROPOFOL N/A 02/22/2020   Procedure: ESOPHAGOGASTRODUODENOSCOPY (EGD) WITH PROPOFOL;  Surgeon: Regis Bill, MD;  Location: [facility] ENDOSCOPY;  Service: Endoscopy;  Laterality: N/A;   ESOPHAGOGASTRODUODENOSCOPY  (EGD) WITH PROPOFOL N/A 06/07/2022   Procedure: ESOPHAGOGASTRODUODENOSCOPY (EGD) WITH PROPOFOL;  Surgeon: Regis Bill, MD;  Location: [facility] ENDOSCOPY;  Service: Endoscopy;  Laterality: N/A;   HAMMER TOE SURGERY Right 05/23/2019   Procedure: HAMMER TOE CORRECTION;  Surgeon: Gwyneth Revels, DPM;  Location: [facility];  Service: Podiatry;  Laterality: Right;  Diabetic   IR BONE TUMOR(S)RF ABLATION  04/30/2022   IR IMAGING GUIDED PORT INSERTION  04/14/2022   IR KYPHO LUMBAR INC FX REDUCE BONE BX UNI/BIL CANNULATION INC/IMAGING  04/30/2022   IR RADIOLOGIST EVAL & MGMT  03/24/2022   IR RADIOLOGIST EVAL & MGMT  06/23/2022   OSTECTOMY Right 05/23/2019   Procedure: DOUBLE OSTEOTOMY RIGHT;  Surgeon: Gwyneth Revels, DPM;  Location: [facility];  Service: Podiatry;  Laterality: Right;   SKIN CANCER EXCISION     Patient Active Problem List   Diagnosis Date Noted   UTI (urinary tract infection) 08/18/2022   Diverticulitis 08/18/2022   Fall 08/17/2022   Urinary tract infection 08/17/2022   At high risk for aspiration 08/17/2022   AKI (acute kidney injury) (HCC) 08/17/2022   AMS (altered mental status) 08/17/2022   Leukocytosis 08/17/2022   Iron deficiency anemia 04/16/2022   Acute on chronic respiratory failure with hypoxia (HCC) 04/01/2022   Multifocal pneumonia 04/01/2022   Superficial thrombophlebitis 04/01/2022  Pulmonary embolism (HCC) 03/31/2022   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 site [facility]) 03/03/2022   Metastatic adenocarcinoma (HCC) 02/18/2022   Acute low back pain 02/17/2022   Adenocarcinoma (HCC) 02/17/2022   Delirium due to another medical condition 02/17/2022   Palliative care encounter 02/17/2022   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   Neoplasm related pain 02/11/2022   Supraclavicular adenopathy 02/11/2022   Mediastinal mass 02/11/2022   Goals of care, counseling/discussion 02/11/2022    Pathologic fracture of lumbar vertebra, initial encounter 02/11/2022   Pathologic fracture 02/10/2022   Pyuria 02/10/2022   Diarrhea    COVID-19 08/18/2019   Thumb pain 06/26/2015   Medicare annual wellness visit, subsequent 03/14/2015   Arthralgia 09/02/2014   Hot flashes 01/21/2014   Left shoulder pain 09/20/2013   Unspecified constipation 08/30/2012   Routine general medical examination at a health care facility 05/29/2012   Screening for colon cancer 05/29/2012   Hypertension 08/18/2011   Osteoarthritis 03/20/2009   Diabetes mellitus type 2, controlled (HCC) 11/14/2008   BASAL CELL CARCINOMA, FACE 12/06/2006   Hyperlipidemia 12/06/2006   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   ALLERGIC RHINITIS 12/06/2006   GERD 12/06/2006   DEGENERATIVE JOINT DISEASE, GENERALIZED 12/06/2006    ONSET DATE 07/13/22 date of referral; 02/11/2022 onset date  REFERRING DIAG: J95.89 (ICD-10-CM) - Other postprocedural complications and disorders of respiratory system, not elsewhere classified R49.0 (ICD-10-CM) - Dysphonia  THERAPY DIAG:  Dysphonia  Oropharyngeal dysphagia  Rationale for Evaluation and Treatment Rehabilitation  SUBJECTIVE:   SUBJECTIVE STATEMENT: Pt with inaccurate recall of information since last ST session Pt accompanied by: family member  PERTINENT HISTORY and DIAGNOSTIC FINDINGS:  Amber Glenn is a 72 year old female with multiple medical problems including diabetes, hypertension, hyperlipidemia, depression, metastic adenocarcinoma of the lung (diagnosed 02/2022).   Pt sustained a compression fracture of L1 s/p fixation by neurosurgery (02/10/2022).  Further testing included CT Chest Abdomen Pelvis with Contrast (02/10/2022) that revealed 1. 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 This is in the immediate vicinity of the phrenic nerve, with new elevation of the left  hemidiaphragm compatible with phrenic nerve impingement and diaphragmatic dysfunction. There is also mild left supraclavicular adenopathy. 2. Scattered tree-in-bud reticulonodular opacities in the lungs, right greater than left, characteristic for atypical infectious bronchiolitis. One of the largest nodules is a 12 by 7 by 11 mm nodule in the right lateral costophrenic angle, which could be malignant or infectious. 3. Subtle abnormal hypoenhancement inferiorly in the pancreatic body measuring about 1.2 by 0.7 by 1.3 cm, along with some indistinct hypoenhancement in the pancreatic head. This could be inflammatory but infiltrative pancreatic adenocarcinoma cannot be excluded. This warrants detailed workup with either pancreatic protocol CT or MRI with and without contrast. PET-CT could also be helpful in this case. 4. As shown on lumbar spine MRI, there is a pathologic (malignant) fracture at L1 with diffuse abnormal lysis involving the vertebral body, bilateral pedicles, and bilateral transverse processes at this level with about 65% of vertebral body height and with a posterior bulging contour of the vertebral body by about 5-6 mm. Further detail on dedicated lumbar spine CT. 5. Mild acute sigmoid colon diverticulitis. Tumor in the vicinity of the local inflammation along the sigmoid colon is not totally excluded but is a less likely differential diagnostic consideration. 6. Aortic atherosclerosis.  Coronary atherosclerosis. Pt underwent an  ultrasound-guided core biopsy of "hypoechoic, abnormal appearing lymph node just lateral to the left internal jugular vein measuring approximately 1.2x1.0x1.2 cm." Results positive for lung adenocarcinoma. Pt underwent Palliative radiation to spine as well as combination of chemotherapy, immunotherapy and Keytruda.   Following biopsy of lymph node, pt began experiencing dysphagia and hoarseness. She had a Barium Swallow on 03/24/22: "Mild relative narrowing of the distal  esophagus just proximal to  the gastroesophageal junction concerning for a mild stricture  restricting the passage of a 13 mm barium tablet.  Mild tertiary contractions of the esophagus as can be seen with mild spasm." EGD on 06/07/22: "No endoscopic abnormality was evident in the esophagus to explain the patient's complaint of dysphagia." MBSS on 07/05/2022 : " multifactorial oropharyngeal and pharyngoesophageal dysphagia. Motor and sensory impairments are noted, with resulting silent aspiration of thin liquids. Anterior hyoid excursion, laryngeal closure, and epiglottic deflection are incomplete, which results in frank penetration and aspiration of thin liquid during the swallow which was unsensed. Cued cough was not effective to fully eject aspiration, and was subjectively weak. Strategies including 3 second bolus hold, as well as left and right head turn did not prevent penetration/aspiration. Pharyngeal stripping wave was diminished, and in anterior-posterior view unilateral bulging occurred (left), suggesting unilateral weakness. 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 No   FALLS: Has patient fallen in last 6 months? Yes, Number of falls: 2  LIVING ENVIRONMENT: Lives with: lives with their family Lives in: House/apartment  PLOF: Independent  PATIENT GOALS   "to drink thin liquids; being able to speak and talk plain again"  OBJECTIVE:    TODAY'S TREATMENT:  Skilled treatment session focused on pt's dysphagia and dysphonia goals. SLP facilitated session by providing the following interventions:   Pt arrived to session stating that she had been doing better. Pt's daughter present and provides history of finding pt on the floor with hospitalization on 08/17/2022 with confusion, UTI and PNA. Her daughter  also reports that pt was found "raving" her car because there "were men in my bedroom." As a result of the above, pt has not been able to practice.   Pt able to complete 3 sets of 10 repetitions with EMST set at 15 cmH2O given moderate cues. Pt able to complete 8 reps with EMST set at 25 cmH2O.   PATIENT EDUCATION: Education details: see above Person educated: Patient and Child(ren) Education method: Explanation Education comprehension: verbalized understanding and needs further education   HOME EXERCISE PROGRAM: EMST 75 set at 15 cmH2O - 3 sets of 10; 3 times per day Vocal cord adduction exercises     GOALS: Goals reviewed with patient? Yes  SHORT TERM GOALS: Target date: 10 sessions  To determine optimal resistance levels for Respiratory Muscle Training (RMT) for improving increase hyolaryngeal elevation and strengthen cough for airway clearance, patient will participate in evaluation (and re-assessment as needed) of maximum expiratory pressure (MEP) and maximum inspiratory pressure (MIP) at next therapy session. Baseline: Goal status: INITIAL  2.  The patient will eliminate phonotraumatic behaviors such as chronic throat clearing, by substituting non-traumatic methods to clear mucus. Baseline:  Goal status: INITIAL  LONG TERM GOALS: Target date: 09/15/2022  Patient will improve perception of swallowing as indicated by an improvement in EAT-10 score to 5 (Baseline = 37) by 12 weeks  from initial swallowing therapy session. Baseline:  Goal status: INITIAL  2.  Patient will consume recommended diet using strategies and compensations without overt s/sx aspiration >95% of the time. Baseline:  Goal status: INITIAL  3.  Patient will participate in objective swallowing evaluation (MBSS) to identify safest diet recommendation as well as therapeutic targets prior to discharge from services. Baseline:  Goal status: INITIAL  4.  The patient will increase hydration for an eventual  goal of 6-8 glasses per day and limit caffeine intake (to maximum of 1-2, 8 oz cups/day), as measured by patient report. Baseline:  Goal status: INITIAL    ASSESSMENT:  CLINICAL IMPRESSION: Pt continues with "Increasing left lung base opacity. Atelectasis is favored over infiltrate recommend follow-up. This has a nodular appearance slightly as well. Please correlate with previous CT scan from 07/02/2022" per chest x-ray on 08/17/2022. Pt continues with dysphonia and concern for airway protection d/t increased fatigue. Pt would benefit from another instrumental swallow study to re-assess ability to protect airway with PO consumption.   OBJECTIVE IMPAIRMENTS include voice disorder and dysphagia. These impairments are limiting patient from managing appointments, household responsibilities, ADLs/IADLs, effectively communicating at home and in community, and safety when swallowing. Factors affecting potential to achieve goals and functional outcome are severity of impairments.. Patient will benefit from skilled SLP services to address above impairments and improve overall function.  REHAB POTENTIAL: Fair left vocal cord paresis  PLAN: SLP FREQUENCY: 1-2x/week  SLP DURATION: 8 weeks  PLANNED INTERVENTIONS: Aspiration precaution training, Pharyngeal strengthening exercises, Diet toleration management , Trials of upgraded texture/liquids, SLP instruction and feedback, and Patient/family education   Kinaya Hilliker B. Dreama Saa, M.S., CCC-SLP, Tree surgeon Certified Brain Injury Specialist [facility]  [facility] Rehabilitation Services Office [phone] Ascom [phone] Fax [phone]

## 2022-08-31 ENCOUNTER — Other Ambulatory Visit: Payer: Self-pay | Admitting: *Deleted

## 2022-08-31 DIAGNOSIS — Z9189 Other specified personal risk factors, not elsewhere classified: Secondary | ICD-10-CM

## 2022-08-31 DIAGNOSIS — R1310 Dysphagia, unspecified: Secondary | ICD-10-CM

## 2022-08-31 DIAGNOSIS — J3800 Paralysis of vocal cords and larynx, unspecified: Secondary | ICD-10-CM

## 2022-09-01 ENCOUNTER — Ambulatory Visit
Admission: RE | Admit: 2022-09-01 | Discharge: 2022-09-01 | Disposition: A | Discharge status: Home / Self Care | Payer: Medicare HMO | Source: Ambulatory Visit | Attending: Hospice and Palliative Medicine | Admitting: Hospice and Palliative Medicine

## 2022-09-01 ENCOUNTER — Ambulatory Visit: Payer: Medicare HMO | Admitting: Speech Pathology

## 2022-09-01 DIAGNOSIS — R1310 Dysphagia, unspecified: Secondary | ICD-10-CM | POA: Insufficient documentation

## 2022-09-01 DIAGNOSIS — Z9189 Other specified personal risk factors, not elsewhere classified: Secondary | ICD-10-CM | POA: Diagnosis present

## 2022-09-01 DIAGNOSIS — J3800 Paralysis of vocal cords and larynx, unspecified: Secondary | ICD-10-CM | POA: Diagnosis present

## 2022-09-01 DIAGNOSIS — R1313 Dysphagia, pharyngeal phase: Secondary | ICD-10-CM

## 2022-09-01 DIAGNOSIS — R490 Dysphonia: Secondary | ICD-10-CM

## 2022-09-02 NOTE — Procedures (Signed)
Modified Barium Swallow Study  Patient Details  Name: Amber Glenn MRN: [id number] Date of Birth: 11/19/1950  Today's Date: 09/02/2022  Modified Barium Swallow completed.  Full report located under Chart Review in the Imaging Section.  History of Present Illness Amber Glenn is a 72 year old female with multiple medical problems including diabetes, hypertension, hyperlipidemia, depression, metastic adenocarcinoma of the lung (diagnosed 02/2022).        Pt sustained a compression fracture of L1 s/p fixation by neurosurgery (02/10/2022).  Further testing included CT Chest Abdomen Pelvis with Contrast (02/10/2022) that revealed 1. 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 There is also mild left supraclavicular adenopathy. 2. Scattered tree-in-bud reticulonodular opacities in the lungs, right greater than left, characteristic for atypical infectious bronchiolitis. One of the largest nodules is a 12 by 7 by 11 mm nodule in the right lateral costophrenic angle, which could be malignant or infectious. 3. Subtle abnormal hypoenhancement inferiorly in the pancreatic body measuring about 1.2 by 0.7 by 1.3 cm, along with some indistinct hypoenhancement in the pancreatic head. This could be inflammatory but infiltrative pancreatic adenocarcinoma cannot be excluded. This warrants detailed workup with either pancreatic protocol CT or MRI with and without contrast. PET-CT could also be helpful in this case. 4. As shown on lumbar spine MRI, there is a pathologic (malignant) fracture at L1 with diffuse abnormal lysis involving the vertebral body, bilateral pedicles, and bilateral transverse processes at this level with about 65% of vertebral body height and  with a posterior bulging contour of the vertebral body by about 5-6 mm. Further detail on dedicated lumbar spine CT. 5. Mild acute sigmoid colon diverticulitis. Tumor in the vicinity of the local inflammation along the sigmoid colon is not totally excluded but is a less likely differential diagnostic consideration. 6. Aortic atherosclerosis.  Coronary atherosclerosis. Pt underwent an ultrasound-guided core biopsy of "hypoechoic, abnormal appearing lymph node just lateral to the left internal jugular vein measuring approximately 1.2x1.0x1.2 cm." Results positive for lung adenocarcinoma. Pt underwent Palliative radiation to spine as well as combination of chemotherapy, immunotherapy and Keytruda.        Following biopsy of lymph node, pt began experiencing dysphagia and hoarseness. She had a Barium Swallow on 03/24/22: She had a Barium Swallow on 03/24/22: "Mild relative narrowing of the distal esophagus just proximal to the gastroesophageal junction concerning for a mild stricture restricting the passage of a 13 mm barium tablet. Mild tertiary contractions of the esophagus as can be seen with mild spasm." EGD on 06/07/22: "No endoscopic abnormality was evident in the esophagus to explain the patient's complaint of dysphagia." MBSS on 07/05/2022 : " multifactorial oropharyngeal and pharyngoesophageal dysphagia. Motor and sensory impairments are noted, with resulting silent aspiration of thin liquids. Anterior hyoid excursion, laryngeal closure, and epiglottic deflection are incomplete, which results in frank penetration and aspiration of thin liquid during the swallow which was unsensed. Cued cough was not effective to fully eject aspiration, and was subjectively weak. Strategies including 3 second bolus hold, as well as left and right head turn did not prevent penetration/aspiration. Pharyngeal stripping wave was diminished, and in anterior-posterior view unilateral bulging occurred (left), suggesting unilateral  weakness. 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    Clinical Impression   Pt presents with worsening pharyngeal phase dysphagia resulting in intermittent silent aspiration of nectar thick liquids.  d/t incomplete glottal closure. Suspect worsening ability caused by deconditioning related to current medical condition and cancer treatments.   Pt's pharyngeal phase is c/b continued swallow initiation at posterior laryngeal surface of the epiglottis as well as incomplete anterior hyoid excursion, epiglottic deflection and incomplete glottal closure resulting in intermittent silent aspiration of nectar thick liquids. Aspiration is intermittent but reduced when consuming small single sips via cup. In addition to decreased airway protection, pt with increased pharyngeal residue with nectar thick liquids, honey thick liquids and puree when compared to previous study (07/05/2022). Residuals coincided with wet vocal quality but were not observed to be penetrated or aspirated and were cleared with a cued cough.   Head turn to left wasn't effective in reducing aspiration.   Most conservative diet would be honey thick liquids via spoon with least restrictive diet being consumption of nectar thick liquids with strict aspiration precautions.    Factors that may increase risk of adverse event in presence of aspiration [name] 2021): Respiratory or GI disease;Poor general health and/or compromised immunity;Reduced cognitive function;Frail or deconditioned;Weak cough  Swallow Evaluation Recommendations Recommendations: PO diet PO Diet Recommendation: Regular;Mildly thick liquids (Level 2, nectar thick);Moderately thick liquids (Level 3, honey thick) Liquid  Administration via: Cup Medication Administration: Whole meds with puree Supervision: Full supervision/cueing for swallowing strategies;Patient able to self-feed Swallowing strategies  : Minimize environmental distractions;Slow rate;Small bites/sips Postural changes: Position pt fully upright for meals;Stay upright 30-60 min after meals Oral care recommendations: Oral care QID (4x/day) Caregiver Recommendations:  (avoid mixed consistencies)     Katerra Ingman B. Dreama Saa, M.S., CCC-SLP, Tree surgeon Certified Brain Injury Specialist [facility]  [facility] Rehabilitation Services Office [phone] Ascom [phone] Fax [phone]

## 2022-09-02 NOTE — Therapy (Signed)
OUTPATIENT SPEECH LANGUAGE PATHOLOGY TREATMENT NOTE DISCHARGE SUMMARY   Patient Name: Amber Glenn MRN: [id number] DOB:Aug 21, 1950, 72 y.o., female Today's Date: 09/02/2022  PCP: Joen Laura, MD REFERRING PROVIDER: Linus Salmons, MD       End of Session - 09/02/22 0913     Visit Number 10    Number of Visits 17    Date for SLP Re-Evaluation 09/15/22    Authorization Type [employer]    Progress Note Due on Visit 10    SLP Start Time 1430    SLP Stop Time  1455    SLP Time Calculation (min) 25 min    Activity Tolerance Patient tolerated treatment well             Past Medical History:  Diagnosis Date   Arthritis    hands and feet   Cancer associated pain    Cataract    right   Complication of anesthesia    sometimes have a hard time waking up   Diabetes mellitus    Borderline Diabetes   GERD (gastroesophageal reflux disease)    HTN (hypertension)    Hyperlipidemia    Lung cancer (HCC)    Motion sickness    Post corneal transplant    right   Past Surgical History:  Procedure Laterality Date   APPLICATION OF INTRAOPERATIVE CT SCAN  02/15/2022   Procedure: APPLICATION OF INTRAOPERATIVE CT SCAN;  Surgeon: Venetia Night, MD;  Location: [facility] ORS;  Service: Neurosurgery;;   BASAL CELL CARCINOMA EXCISION     Dr. Lorn Junes   CARPAL TUNNEL RELEASE     right   COLONOSCOPY WITH PROPOFOL N/A 12/03/2015   Procedure: COLONOSCOPY WITH PROPOFOL;  Surgeon: Scot Jun, MD;  Location: [facility] END[facility]COPY;  Service: Endoscopy;  Laterality: N/A;   COLONOSCOPY WITH PROPOFOL N/A 02/22/2020   Procedure: COLONOSCOPY WITH PROPOFOL;  Surgeon: Regis Bill, MD;  Location: [facility] ENDOSCOPY;  Service: Endoscopy;  Laterality: N/A;   ESOPHAGOGASTRODUODENOSCOPY (EGD) WITH PROPOFOL N/A 02/22/2020   Procedure: ESOPHAGOGASTRODUODENOSCOPY (EGD) WITH PROPOFOL;  Surgeon: Regis Bill, MD;  Location: [facility] ENDOSCOPY;  Service: Endoscopy;  Laterality: N/A;    ESOPHAGOGASTRODUODENOSCOPY (EGD) WITH PROPOFOL N/A 06/07/2022   Procedure: ESOPHAGOGASTRODUODENOSCOPY (EGD) WITH PROPOFOL;  Surgeon: Regis Bill, MD;  Location: [facility] ENDOSCOPY;  Service: Endoscopy;  Laterality: N/A;   HAMMER TOE SURGERY Right 05/23/2019   Procedure: HAMMER TOE CORRECTION;  Surgeon: Gwyneth Revels, DPM;  Location: [facility];  Service: Podiatry;  Laterality: Right;  Diabetic   IR BONE TUMOR(S)RF ABLATION  04/30/2022   IR IMAGING GUIDED PORT INSERTION  04/14/2022   IR KYPHO LUMBAR INC FX REDUCE BONE BX UNI/BIL CANNULATION INC/IMAGING  04/30/2022   IR RADIOLOGIST EVAL & MGMT  03/24/2022   IR RADIOLOGIST EVAL & MGMT  06/23/2022   OSTECTOMY Right 05/23/2019   Procedure: DOUBLE OSTEOTOMY RIGHT;  Surgeon: Gwyneth Revels, DPM;  Location: [facility];  Service: Podiatry;  Laterality: Right;   SKIN CANCER EXCISION     Patient Active Problem List   Diagnosis Date Noted   UTI (urinary tract infection) 08/18/2022   Diverticulitis 08/18/2022   Fall 08/17/2022   Urinary tract infection 08/17/2022   At high risk for aspiration 08/17/2022   AKI (acute kidney injury) (HCC) 08/17/2022   AMS (altered mental status) 08/17/2022   Leukocytosis 08/17/2022   Iron deficiency anemia 04/16/2022   Acute on chronic respiratory failure with hypoxia (HCC) 04/01/2022   Multifocal pneumonia 04/01/2022   Superficial thrombophlebitis 04/01/2022  Pulmonary embolism (HCC) 03/31/2022   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 site [facility]) 03/03/2022   Metastatic adenocarcinoma (HCC) 02/18/2022   Acute low back pain 02/17/2022   Adenocarcinoma (HCC) 02/17/2022   Delirium due to another medical condition 02/17/2022   Palliative care encounter 02/17/2022   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   Neoplasm related pain 02/11/2022   Supraclavicular adenopathy 02/11/2022   Mediastinal mass 02/11/2022   Goals of care,  counseling/discussion 02/11/2022   Pathologic fracture of lumbar vertebra, initial encounter 02/11/2022   Pathologic fracture 02/10/2022   Pyuria 02/10/2022   Diarrhea    COVID-19 08/18/2019   Thumb pain 06/26/2015   Medicare annual wellness visit, subsequent 03/14/2015   Arthralgia 09/02/2014   Hot flashes 01/21/2014   Left shoulder pain 09/20/2013   Unspecified constipation 08/30/2012   Routine general medical examination at a health care facility 05/29/2012   Screening for colon cancer 05/29/2012   Hypertension 08/18/2011   Osteoarthritis 03/20/2009   Diabetes mellitus type 2, controlled (HCC) 11/14/2008   BASAL CELL CARCINOMA, FACE 12/06/2006   Hyperlipidemia 12/06/2006   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   ALLERGIC RHINITIS 12/06/2006   GERD 12/06/2006   DEGENERATIVE JOINT DISEASE, GENERALIZED 12/06/2006    ONSET DATE 07/13/22 date of referral; 02/11/2022 onset date  REFERRING DIAG: J95.89 (ICD-10-CM) - Other postprocedural complications and disorders of respiratory system, not elsewhere classified R49.0 (ICD-10-CM) - Dysphonia  THERAPY DIAG:  Dysphonia  Dysphagia, pharyngeal phase  Rationale for Evaluation and Treatment Rehabilitation  SUBJECTIVE:   SUBJECTIVE STATEMENT: Pt with difficulty following directions, wet vocal quality Pt accompanied by: family member  PERTINENT HISTORY and DIAGNOSTIC FINDINGS:  Amber Glenn is a 72 year old female with multiple medical problems including diabetes, hypertension, hyperlipidemia, depression, metastic adenocarcinoma of the lung (diagnosed 02/2022).   Pt sustained a compression fracture of L1 s/p fixation by neurosurgery (02/10/2022).  Further testing included CT Chest Abdomen Pelvis with Contrast (02/10/2022) that revealed 1. 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 This is in the immediate vicinity of the phrenic nerve, with  new elevation of the left hemidiaphragm compatible with phrenic nerve impingement and diaphragmatic dysfunction. There is also mild left supraclavicular adenopathy. 2. Scattered tree-in-bud reticulonodular opacities in the lungs, right greater than left, characteristic for atypical infectious bronchiolitis. One of the largest nodules is a 12 by 7 by 11 mm nodule in the right lateral costophrenic angle, which could be malignant or infectious. 3. Subtle abnormal hypoenhancement inferiorly in the pancreatic body measuring about 1.2 by 0.7 by 1.3 cm, along with some indistinct hypoenhancement in the pancreatic head. This could be inflammatory but infiltrative pancreatic adenocarcinoma cannot be excluded. This warrants detailed workup with either pancreatic protocol CT or MRI with and without contrast. PET-CT could also be helpful in this case. 4. As shown on lumbar spine MRI, there is a pathologic (malignant) fracture at L1 with diffuse abnormal lysis involving the vertebral body, bilateral pedicles, and bilateral transverse processes at this level with about 65% of vertebral body height and with a posterior bulging contour of the vertebral body by about 5-6 mm. Further detail on dedicated lumbar spine CT. 5. Mild acute sigmoid colon diverticulitis. Tumor in the vicinity of the local inflammation along the sigmoid colon is not totally excluded but is a less likely differential diagnostic consideration. 6. Aortic atherosclerosis.  Coronary atherosclerosis. Pt underwent an ultrasound-guided  core biopsy of "hypoechoic, abnormal appearing lymph node just lateral to the left internal jugular vein measuring approximately 1.2x1.0x1.2 cm." Results positive for lung adenocarcinoma. Pt underwent Palliative radiation to spine as well as combination of chemotherapy, immunotherapy and Keytruda.   Following biopsy of lymph node, pt began experiencing dysphagia and hoarseness. She had a Barium Swallow on 03/24/22: "Mild relative  narrowing of the distal esophagus just proximal to  the gastroesophageal junction concerning for a mild stricture  restricting the passage of a 13 mm barium tablet.  Mild tertiary contractions of the esophagus as can be seen with mild spasm." EGD on 06/07/22: "No endoscopic abnormality was evident in the esophagus to explain the patient's complaint of dysphagia." MBSS on 07/05/2022 : " multifactorial oropharyngeal and pharyngoesophageal dysphagia. Motor and sensory impairments are noted, with resulting silent aspiration of thin liquids. Anterior hyoid excursion, laryngeal closure, and epiglottic deflection are incomplete, which results in frank penetration and aspiration of thin liquid during the swallow which was unsensed. Cued cough was not effective to fully eject aspiration, and was subjectively weak. Strategies including 3 second bolus hold, as well as left and right head turn did not prevent penetration/aspiration. Pharyngeal stripping wave was diminished, and in anterior-posterior view unilateral bulging occurred (left), suggesting unilateral weakness. 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 No   FALLS: Has patient fallen in last 6 months? Yes, Number of falls: 2  LIVING ENVIRONMENT: Lives with: lives with their family Lives in: House/apartment  PLOF: Independent  PATIENT GOALS   "to drink thin liquids; being able to speak and talk plain again"  OBJECTIVE:    TODAY'S TREATMENT:  Skilled treatment session focused on pt's dysphagia and dysphonia goals. SLP facilitated session by providing the following interventions:   SLP provided extensive education on strict aspiration precautions Video playback provided with identification of moments of aspiration Instructions for pt to cough (not throat clear)  when demonstrating wet vocal quality  PATIENT EDUCATION: Education details: see above Person educated: Patient and Child(ren) Education method: Explanation Education comprehension: verbalized understanding and needs further education   HOME EXERCISE PROGRAM: EMST 75 set at 15 cmH2O - 3 sets of 10; 3 times per day    GOALS: Goals reviewed with patient? Yes  SHORT TERM GOALS: Target date: 10 sessions  To determine optimal resistance levels for Respiratory Muscle Training (RMT) for improving increase hyolaryngeal elevation and strengthen cough for airway clearance, patient will participate in evaluation (and re-assessment as needed) of maximum expiratory pressure (MEP) and maximum inspiratory pressure (MIP) at next therapy session. Baseline: Goal status: INITIAL; MET  2.  The patient will eliminate phonotraumatic behaviors such as chronic throat clearing, by substituting non-traumatic methods to clear mucus. Baseline:  Goal status: INITIAL; MET  LONG TERM GOALS: Target date: 09/15/2022  Patient will improve perception of swallowing as indicated by an improvement in EAT-10 score to 5 (Baseline = 37) by 12 weeks from initial swallowing therapy session. Baseline:  Goal status: INITIAL; not met  2.  Patient will consume recommended diet using strategies and compensations without overt s/sx aspiration >95% of the time. Baseline:  Goal status: INITIAL; MET  3.  Patient will participate in objective swallowing evaluation (MBSS) to identify safest diet recommendation as well as therapeutic targets prior to discharge from services. Baseline:  Goal status: INITIAL;MET  4.  The patient will increase hydration for an eventual goal of 6-8 glasses per day and limit caffeine intake (to maximum of 1-2, 8 oz cups/day), as measured by patient report. Baseline:  Goal status: INITIAL; not MET d/t overall medical condition    ASSESSMENT:  CLINICAL IMPRESSION: Pt continues with moderate to  severe dysphonia and moderate pharyngeal phase dysphagia. Despite pt's best efforts, skilled ST services are not impacting her functionally. All education has been completed with pt and her family to follow up with ENT/oncologist for further POC.    PLAN: SLP FREQUENCY:  Pt is appropriate for discharge at this time as skilled ST intervention is no longer impacting her function.   Chase Knebel B. Dreama Saa, M.S., CCC-SLP, Tree surgeon Certified Brain Injury Specialist [facility]  [facility] Office [phone] Ascom [phone] Fax [phone]

## 2022-09-08 ENCOUNTER — Ambulatory Visit: Payer: Medicare HMO | Admitting: Speech Pathology

## 2022-09-10 ENCOUNTER — Ambulatory Visit: Payer: Medicare HMO

## 2022-09-10 ENCOUNTER — Ambulatory Visit: Payer: Medicare HMO | Admitting: Oncology

## 2022-09-10 ENCOUNTER — Other Ambulatory Visit: Payer: Medicare HMO

## 2022-09-10 ENCOUNTER — Inpatient Hospital Stay: Payer: Medicare HMO

## 2022-09-14 ENCOUNTER — Encounter: Payer: Self-pay | Admitting: Student in an Organized Health Care Education/Training Program

## 2022-09-14 ENCOUNTER — Ambulatory Visit (INDEPENDENT_AMBULATORY_CARE_PROVIDER_SITE_OTHER): Payer: Medicare HMO | Admitting: Student in an Organized Health Care Education/Training Program

## 2022-09-14 VITALS — BP 122/60 | HR 114 | Temp 98.2°F | Ht 60.0 in | Wt 108.6 lb

## 2022-09-14 DIAGNOSIS — J690 Pneumonitis due to inhalation of food and vomit: Secondary | ICD-10-CM

## 2022-09-14 MED ORDER — ALBUTEROL SULFATE (2.5 MG/3ML) 0.083% IN NEBU
2.5000 mg | INHALATION_SOLUTION | Freq: Two times a day (BID) | RESPIRATORY_TRACT | 12 refills | Status: DC
Start: 2022-09-14 — End: 2023-01-03

## 2022-09-14 MED ORDER — SODIUM CHLORIDE 3 % IN NEBU
INHALATION_SOLUTION | Freq: Two times a day (BID) | RESPIRATORY_TRACT | 12 refills | Status: DC
Start: 2022-09-14 — End: 2023-01-03

## 2022-09-14 NOTE — Patient Instructions (Signed)
We are going to do a pulmonary clearance regimen as follows:  -use the hypertonic saline nebulizer -followed by the albuterol nebulizer -followed by the acapella device for a few minutes -followed by forceful coughing with physical chest physiotherapy

## 2022-09-15 NOTE — Progress Notes (Signed)
Synopsis: Referred in for chronic cough by Borders, Daryl Eastern, NP  Assessment & Plan:   1. Recurrent aspiration pneumonia (HCC)  Presents for the evaluation of chronic cough in the setting of recurrent aspiration. Patient has had a swallowing study that shows aspiration, and her diet is currently modified. While a modified diet decreases the risk of overt aspiration of food content, microaspiration of saliva remains a risk, and is likely a driver behind the patient's symptoms.  Review of the patient's imaging (March of 2024 chest CT, May of 2024 abdomen CT) shows infiltrates in the LLL (more on the may CT) with associated bronchiectasis and bronchiolectasis, especially in the base of the LLL. This is consistent with the provided history of recurrent aspiration.  I will recommend a pulmonary clearance regimen to include hypertonic saline, followed by nebulized albuterol, followed by a flutter/acapella device. I have also counseled the patient's daughter on how to perform physical chest PT by tapping on her back. The hope is to thin out and mobilize secretions to decrease the risk of recurrent aspiration pneumonia. I also floated the idea of a feeding tube that would prevent overt aspiration (thou would not decrease the risk of microaspiration).  - AMB REFERRAL FOR DME - sodium chloride HYPERTONIC 3 % nebulizer solution; Take by nebulization in the morning and at bedtime.  Dispense: 750 mL; Refill: 12 - albuterol (PROVENTIL) (2.5 MG/3ML) 0.083% nebulizer solution; Take 3 mLs (2.5 mg total) by nebulization in the morning and at bedtime.  Dispense: 169 mL; Refill: 12   Return in about 5 weeks (around 10/19/2022).  I spent 45 minutes caring for this patient today, including preparing to see the patient, obtaining a medical history , reviewing a separately obtained history, performing a medically appropriate examination and/or evaluation, counseling and educating the patient/family/caregiver, ordering  medications, tests, or procedures, and documenting clinical information in the electronic health record  Raechel Chute, MD [facility] 09/15/2022 7:41 PM    End of visit medications:  Meds ordered this encounter  Medications   sodium chloride HYPERTONIC 3 % nebulizer solution    Sig: Take by nebulization in the morning and at bedtime.    Dispense:  750 mL    Refill:  12   albuterol (PROVENTIL) (2.5 MG/3ML) 0.083% nebulizer solution    Sig: Take 3 mLs (2.5 mg total) by nebulization in the morning and at bedtime.    Dispense:  169 mL    Refill:  12     Current Outpatient Medications:    acetaminophen (TYLENOL) 500 MG tablet, Take 500 mg by mouth every 6 (six) hours as needed for moderate pain, mild pain, fever or headache., Disp: , Rfl:    albuterol (PROVENTIL) (2.5 MG/3ML) 0.083% nebulizer solution, Take 3 mLs (2.5 mg total) by nebulization in the morning and at bedtime., Disp: 169 mL, Rfl: 12   Ascorbic Acid (VITAMIN C) 1000 MG tablet, Take 1,000 mg by mouth daily.  , Disp: , Rfl:    carvedilol (COREG) 3.125 MG tablet, Take 3.125 mg by mouth 2 (two) times daily with a meal., Disp: , Rfl:    cyclobenzaprine (FLEXERIL) 5 MG tablet, Take 1 tablet (5 mg total) by mouth 3 (three) times daily as needed for muscle spasms., Disp: 30 tablet, Rfl: 0   dexamethasone (DECADRON) 4 MG tablet, TAKE 1 TABLET 2 TIMES DAILY STARTING DAY BEFORE PEMETREXED. THEN TAKE 2 TABS DAILY FOR 3 DAYS STARTING DAY AFTER CARBOPLATIN. TAKE WITH FOOD., Disp: 30 tablet,  Rfl: 1   Docusate Calcium (STOOL SOFTENER PO), Take 1 capsule by mouth daily as needed (constipation)., Disp: , Rfl:    ELIQUIS 5 MG TABS tablet, TAKE 1 TABLET BY MOUTH TWICE A DAY, Disp: 60 tablet, Rfl: 2   folic acid (FOLVITE) 1 MG tablet, Take 1 tablet (1 mg total) by mouth daily. Start 7 days before pemetrexed chemotherapy. Continue until 21 days after pemetrexed completed., Disp: 100 tablet, Rfl: 3   gabapentin (NEURONTIN) 100 MG  capsule, Take 100 mg by mouth 2 (two) times daily., Disp: , Rfl:    lidocaine-prilocaine (EMLA) cream, Apply 1 Application topically as needed., Disp: 30 g, Rfl: 2   LORazepam (ATIVAN) 0.5 MG tablet, Take 1 tablet (0.5 mg total) by mouth every 8 (eight) hours as needed for anxiety., Disp: 30 tablet, Rfl: 0   metFORMIN (GLUCOPHAGE) 500 MG tablet, Take 1 tablet (500 mg total) by mouth 2 (two) times daily with a meal., Disp: , Rfl:    Multiple Vitamin (MULTIVITAMIN) capsule, Take 1 capsule by mouth daily.  , Disp: , Rfl:    omeprazole (PRILOSEC) 20 MG capsule, Take 1 capsule (20 mg total) by mouth daily., Disp: 90 capsule, Rfl: 3   ondansetron (ZOFRAN) 8 MG tablet, Take 1 tablet (8 mg total) by mouth every 8 (eight) hours as needed for nausea or vomiting. Start on the third day after carboplatin., Disp: 30 tablet, Rfl: 1   Oxycodone HCl 20 MG TABS, Take 0.5-1 tablets (10-20 mg total) by mouth every 4 (four) hours as needed., Disp: 60 tablet, Rfl: 0   polyethylene glycol (MIRALAX / GLYCOLAX) 17 g packet, Take 17 g by mouth daily., Disp: 14 each, Rfl: 0   prednisoLONE acetate (PRED MILD) 0.12 % ophthalmic suspension, Place 1 drop into both eyes daily., Disp: , Rfl:    prochlorperazine (COMPAZINE) 10 MG tablet, Take 1 tablet (10 mg total) by mouth every 6 (six) hours as needed for nausea or vomiting., Disp: 30 tablet, Rfl: 2   simvastatin (ZOCOR) 40 MG tablet, TAKE 1 TABLET EVERY DAY  AT  6:00 PM, Disp: 90 tablet, Rfl: 3   sodium chloride HYPERTONIC 3 % nebulizer solution, Take by nebulization in the morning and at bedtime., Disp: 750 mL, Rfl: 12   OLANZapine (ZYPREXA) 10 MG tablet, TAKE 1 TABLET BY MOUTH EVERYDAY AT BEDTIME (Patient taking differently: Take 10 mg by mouth at bedtime.), Disp: 90 tablet, Rfl: 1   oxyCODONE ER (XTAMPZA ER) 18 MG C12A, Take 1 capsule by mouth every 12 (twelve) hours., Disp: 60 capsule, Rfl: 0 No current facility-administered medications for this visit.  Facility-Administered  Medications Ordered in Other Visits:    0.9 %  sodium chloride infusion, , Intravenous, Continuous, Borders, Daryl Eastern, NP, Stopped at 08/09/22 1434   Subjective:   PATIENT ID: Amber Glenn GENDER: female DOB: 06/20/1950, MRN: [id number]  Chief Complaint  Patient presents with   pulmonary consult    Prod cough with yellow sputum, occ SOB with exertion and occ wheezing.     HPI  Ms. Dima is a 72 year old female presenting to clinic for the evaluation of cough.  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 She was initially diagnosed after presenting with back pain, with MRI of the lumbar spine showing a pathologic fracture of L1. Chest CT showed 1 cm left supraclavicular lymphadenopathy and a necrotic anterior  mediastinal mass. She underwent left supraclavicular lymph node biopsy showing adenocarcinoma of lung primary. She is now s/p cycle three of pemetrexed and pembrolizumab.  Patient is referred to [place] for recurrent cough that is productive of yellow sputum. This is associated with exertional dyspnea and occasional wheezing. Per the patient and her daughter, symptoms developed after the lymph node biopsy. She underwent a modified barium swallow showing aspiration of thin liquids without a cough reflex. She was referred to ENT and seen by Dr. Jenne Campus. 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 Patient has continued to have a cough, productive of yellow sputum following this, despite multiple visits with speech and language pathology for training.  Patient has a history of smoking, and quit in 2010.   Ancillary information including prior medications, full medical/surgical/family/social histories, and PFTs (when available) are listed below and have been reviewed.   Review of Systems  Constitutional:  Positive for  malaise/fatigue. Negative for chills, diaphoresis and fever.  Respiratory:  Positive for cough, sputum production, shortness of breath and wheezing. Negative for hemoptysis.   Cardiovascular:  Negative for chest pain.     Objective:   Vitals:   09/14/22 1458  BP: 122/60  Pulse: (!) 114  Temp: 98.2 F (36.8 C)  TempSrc: Temporal  SpO2: 95%  Weight: 108 lb 9.6 oz (49.3 kg)  Height: 5' (1.524 m)   95% on RA  BMI Readings from Last 3 Encounters:  09/14/22 21.21 kg/m  08/27/22 21.89 kg/m  08/17/22 22.85 kg/m   Wt Readings from Last 3 Encounters:  09/14/22 108 lb 9.6 oz (49.3 kg)  08/27/22 112 lb 1.6 oz (50.8 kg)  08/17/22 117 lb (53.1 kg)    Physical Exam Constitutional:      Appearance: Normal appearance.  HENT:     Head: Normocephalic.  Cardiovascular:     Rate and Rhythm: Normal rate and regular rhythm.     Pulses: Normal pulses.     Heart sounds: Normal heart sounds.  Pulmonary:     Effort: Pulmonary effort is normal.     Breath sounds: Rales present. No wheezing.  Abdominal:     Palpations: Abdomen is soft.  Neurological:     General: No focal deficit present.     Mental Status: She is alert and oriented to person, place, and time. Mental status is at baseline.     Ancillary Information    Past Medical History:  Diagnosis Date   Arthritis    hands and feet   Cancer associated pain    Cataract    right   Complication of anesthesia    sometimes have a hard time waking up   Diabetes mellitus    Borderline Diabetes   GERD (gastroesophageal reflux disease)    HTN (hypertension)    Hyperlipidemia    Lung cancer (HCC)    Motion sickness    Post corneal transplant    right     Family History  Problem Relation Age of Onset   Heart disease Mother    Heart disease Father    Heart disease Maternal Grandmother    Heart disease Maternal Grandfather    Breast cancer Sister 17     Past Surgical History:  Procedure Laterality Date   APPLICATION  OF INTRAOPERATIVE CT SCAN  02/15/2022   Procedure: APPLICATION OF INTRAOPERATIVE CT SCAN;  Surgeon: Venetia Night, MD;  Location: [facility] ORS;  Service: Neurosurgery;;   BASAL CELL CARCINOMA EXCISION  Dr. Lorn Junes   CARPAL TUNNEL RELEASE     right   COLONOSCOPY WITH PROPOFOL N/A 12/03/2015   Procedure: COLONOSCOPY WITH PROPOFOL;  Surgeon: Scot Jun, MD;  Location: [facility] END[facility];  Service: Endoscopy;  Laterality: N/A;   COLONOSCOPY WITH PROPOFOL N/A 02/22/2020   Procedure: COLONOSCOPY WITH PROPOFOL;  Surgeon: Regis Bill, MD;  Location: [facility] ENDOSCOPY;  Service: Endoscopy;  Laterality: N/A;   ESOPHAGOGASTRODUODENOSCOPY (EGD) WITH PROPOFOL N/A 02/22/2020   Procedure: ESOPHAGOGASTRODUODENOSCOPY (EGD) WITH PROPOFOL;  Surgeon: Regis Bill, MD;  Location: [facility] ENDOSCOPY;  Service: Endoscopy;  Laterality: N/A;   ESOPHAGOGASTRODUODENOSCOPY (EGD) WITH PROPOFOL N/A 06/07/2022   Procedure: ESOPHAGOGASTRODUODENOSCOPY (EGD) WITH PROPOFOL;  Surgeon: Regis Bill, MD;  Location: [facility] ENDOSCOPY;  Service: Endoscopy;  Laterality: N/A;   HAMMER TOE SURGERY Right 05/23/2019   Procedure: HAMMER TOE CORRECTION;  Surgeon: Gwyneth Revels, DPM;  Location: [facility];  Service: Podiatry;  Laterality: Right;  Diabetic   IR BONE TUMOR(S)RF ABLATION  04/30/2022   IR IMAGING GUIDED PORT INSERTION  04/14/2022   IR KYPHO LUMBAR INC FX REDUCE BONE BX UNI/BIL CANNULATION INC/IMAGING  04/30/2022   IR RADIOLOGIST EVAL & MGMT  03/24/2022   IR RADIOLOGIST EVAL & MGMT  06/23/2022   OSTECTOMY Right 05/23/2019   Procedure: DOUBLE OSTEOTOMY RIGHT;  Surgeon: Gwyneth Revels, DPM;  Location: [facility];  Service: Podiatry;  Laterality: Right;   SKIN CANCER EXCISION      Social History   Socioeconomic History   Marital status: Widowed    Spouse name: Not on file   Number of children: Not on file   Years of education: Not on file   Highest education level: Not on file  Occupational  History    Employer: [employer]    Comment: Currently not employed  Tobacco Use   Smoking status: Former    Packs/day: 2.00    Years: 20.00    Additional pack years: 0.00    Total pack years: 40.00    Types: Cigarettes    Quit date: 01/13/2009    Years since quitting: 13.6   Smokeless tobacco: Never  Vaping Use   Vaping Use: Never used  Substance and Sexual Activity   Alcohol use: No   Drug use: No   Sexual activity: Not Currently    Birth control/protection: Post-menopausal  Other Topics Concern   Not on file  Social History Narrative   Not on file   Social Determinants of Health   Financial Resource Strain: Not on file  Food Insecurity: No Food Insecurity (08/18/2022)   Hunger Vital Sign    Worried About Running Out of Food in the Last Year: Never true    Ran Out of Food in the Last Year: Never true  Transportation Needs: No Transportation Needs (08/18/2022)   PRAPARE - Administrator, Civil Service (Medical): No    Lack of Transportation (Non-Medical): No  Physical Activity: Not on file  Stress: Not on file  Social Connections: Not on file  Intimate Partner Violence: Not At Risk (08/18/2022)   Humiliation, Afraid, Rape, and Kick questionnaire    Fear of Current or Ex-Partner: No    Emotionally Abused: No    Physically Abused: No    Sexually Abused: No     No Known Allergies   CBC    Component Value Date/Time   WBC 9.3 08/27/2022 0903   RBC 3.29 (L) 08/27/2022 0903   HGB 10.2 (L) 08/27/2022 0903   HGB  7.5 (L) 08/09/2022 1324   HCT 33.3 (L) 08/27/2022 0903   PLT 249 08/27/2022 0903   PLT 76 (L) 08/09/2022 1324   MCV 101.2 (H) 08/27/2022 0903   MCH 31.0 08/27/2022 0903   MCHC 30.6 08/27/2022 0903   RDW 19.7 (H) 08/27/2022 0903   LYMPHSABS 1.2 08/27/2022 0903   MONOABS 0.6 08/27/2022 0903   EOSABS 0.0 08/27/2022 0903   BASOSABS 0.1 08/27/2022 0903    Pulmonary Functions Testing Results:     No data to display          Outpatient  Medications Prior to Visit  Medication Sig Dispense Refill   acetaminophen (TYLENOL) 500 MG tablet Take 500 mg by mouth every 6 (six) hours as needed for moderate pain, mild pain, fever or headache.     Ascorbic Acid (VITAMIN C) 1000 MG tablet Take 1,000 mg by mouth daily.       carvedilol (COREG) 3.125 MG tablet Take 3.125 mg by mouth 2 (two) times daily with a meal.     cyclobenzaprine (FLEXERIL) 5 MG tablet Take 1 tablet (5 mg total) by mouth 3 (three) times daily as needed for muscle spasms. 30 tablet 0   dexamethasone (DECADRON) 4 MG tablet TAKE 1 TABLET 2 TIMES DAILY STARTING DAY BEFORE PEMETREXED. THEN TAKE 2 TABS DAILY FOR 3 DAYS STARTING DAY AFTER CARBOPLATIN. TAKE WITH FOOD. 30 tablet 1   Docusate Calcium (STOOL SOFTENER PO) Take 1 capsule by mouth daily as needed (constipation).     ELIQUIS 5 MG TABS tablet TAKE 1 TABLET BY MOUTH TWICE A DAY 60 tablet 2   folic acid (FOLVITE) 1 MG tablet Take 1 tablet (1 mg total) by mouth daily. Start 7 days before pemetrexed chemotherapy. Continue until 21 days after pemetrexed completed. 100 tablet 3   gabapentin (NEURONTIN) 100 MG capsule Take 100 mg by mouth 2 (two) times daily.     lidocaine-prilocaine (EMLA) cream Apply 1 Application topically as needed. 30 g 2   LORazepam (ATIVAN) 0.5 MG tablet Take 1 tablet (0.5 mg total) by mouth every 8 (eight) hours as needed for anxiety. 30 tablet 0   metFORMIN (GLUCOPHAGE) 500 MG tablet Take 1 tablet (500 mg total) by mouth 2 (two) times daily with a meal.     Multiple Vitamin (MULTIVITAMIN) capsule Take 1 capsule by mouth daily.       omeprazole (PRILOSEC) 20 MG capsule Take 1 capsule (20 mg total) by mouth daily. 90 capsule 3   ondansetron (ZOFRAN) 8 MG tablet Take 1 tablet (8 mg total) by mouth every 8 (eight) hours as needed for nausea or vomiting. Start on the third day after carboplatin. 30 tablet 1   Oxycodone HCl 20 MG TABS Take 0.5-1 tablets (10-20 mg total) by mouth every 4 (four) hours as needed.  60 tablet 0   polyethylene glycol (MIRALAX / GLYCOLAX) 17 g packet Take 17 g by mouth daily. 14 each 0   prednisoLONE acetate (PRED MILD) 0.12 % ophthalmic suspension Place 1 drop into both eyes daily.     prochlorperazine (COMPAZINE) 10 MG tablet Take 1 tablet (10 mg total) by mouth every 6 (six) hours as needed for nausea or vomiting. 30 tablet 2   simvastatin (ZOCOR) 40 MG tablet TAKE 1 TABLET EVERY DAY  AT  6:00 PM 90 tablet 3   OLANZapine (ZYPREXA) 10 MG tablet TAKE 1 TABLET BY MOUTH EVERYDAY AT BEDTIME (Patient taking differently: Take 10 mg by mouth at bedtime.) 90 tablet 1  oxyCODONE ER (XTAMPZA ER) 18 MG C12A Take 1 capsule by mouth every 12 (twelve) hours. 60 capsule 0   Facility-Administered Medications Prior to Visit  Medication Dose Route Frequency Provider Last Rate Last Admin   0.9 %  sodium chloride infusion   Intravenous Continuous Borders, Daryl Eastern, NP   Stopped at 08/09/22 1434

## 2022-09-16 ENCOUNTER — Other Ambulatory Visit: Payer: Self-pay | Admitting: *Deleted

## 2022-09-16 MED ORDER — OXYCODONE HCL 20 MG PO TABS: 10.0000 mg | ORAL_TABLET | ORAL | 0 refills | Status: DC | PRN

## 2022-09-16 MED FILL — Dexamethasone Sodium Phosphate Inj 100 MG/10ML: INTRAMUSCULAR | Qty: 1 | Status: AC

## 2022-09-17 ENCOUNTER — Inpatient Hospital Stay: Payer: Medicare HMO | Attending: Oncology

## 2022-09-17 ENCOUNTER — Encounter: Payer: Self-pay | Admitting: Oncology

## 2022-09-17 ENCOUNTER — Inpatient Hospital Stay (HOSPITAL_BASED_OUTPATIENT_CLINIC_OR_DEPARTMENT_OTHER): Payer: Medicare HMO | Admitting: Oncology

## 2022-09-17 ENCOUNTER — Inpatient Hospital Stay: Payer: Medicare HMO

## 2022-09-17 VITALS — BP 111/51 | HR 86 | Temp 96.6°F | Resp 19 | Wt 109.9 lb

## 2022-09-17 DIAGNOSIS — R059 Cough, unspecified: Secondary | ICD-10-CM | POA: Diagnosis not present

## 2022-09-17 DIAGNOSIS — C3491 Malignant neoplasm of unspecified part of right bronchus or lung: Secondary | ICD-10-CM

## 2022-09-17 DIAGNOSIS — R7989 Other specified abnormal findings of blood chemistry: Secondary | ICD-10-CM

## 2022-09-17 DIAGNOSIS — Z803 Family history of malignant neoplasm of breast: Secondary | ICD-10-CM | POA: Diagnosis not present

## 2022-09-17 DIAGNOSIS — C7951 Secondary malignant neoplasm of bone: Secondary | ICD-10-CM | POA: Diagnosis not present

## 2022-09-17 DIAGNOSIS — Z5112 Encounter for antineoplastic immunotherapy: Secondary | ICD-10-CM | POA: Insufficient documentation

## 2022-09-17 DIAGNOSIS — Z87891 Personal history of nicotine dependence: Secondary | ICD-10-CM | POA: Insufficient documentation

## 2022-09-17 DIAGNOSIS — Z5111 Encounter for antineoplastic chemotherapy: Secondary | ICD-10-CM | POA: Insufficient documentation

## 2022-09-17 DIAGNOSIS — C770 Secondary and unspecified malignant neoplasm of lymph nodes of head, face and neck: Secondary | ICD-10-CM | POA: Insufficient documentation

## 2022-09-17 DIAGNOSIS — R400 Somnolence: Secondary | ICD-10-CM | POA: Insufficient documentation

## 2022-09-17 DIAGNOSIS — Z87440 Personal history of urinary (tract) infections: Secondary | ICD-10-CM | POA: Insufficient documentation

## 2022-09-17 LAB — COMPREHENSIVE METABOLIC PANEL
ALT: 14 U/L (ref 0–44)
AST: 27 U/L (ref 15–41)
Albumin: 3.4 g/dL — ABNORMAL LOW (ref 3.5–5.0)
Alkaline Phosphatase: 67 U/L (ref 38–126)
Anion gap: 5 (ref 5–15)
BUN: 30 mg/dL — ABNORMAL HIGH (ref 8–23)
CO2: 23 mmol/L (ref 22–32)
Calcium: 9.1 mg/dL (ref 8.9–10.3)
Chloride: 107 mmol/L (ref 98–111)
Creatinine, Ser: 1.19 mg/dL — ABNORMAL HIGH (ref 0.44–1.00)
GFR, Estimated: 48 mL/min — ABNORMAL LOW (ref >60)
Glucose, Bld: 155 mg/dL — ABNORMAL HIGH (ref 70–99)
Potassium: 4.3 mmol/L (ref 3.5–5.1)
Sodium: 135 mmol/L (ref 135–145)
Total Bilirubin: 0.2 mg/dL — ABNORMAL LOW (ref 0.3–1.2)
Total Protein: 6.8 g/dL (ref 6.5–8.1)

## 2022-09-17 LAB — CBC WITH DIFFERENTIAL/PLATELET
Abs Immature Granulocytes: 0.08 10*3/uL — ABNORMAL HIGH (ref 0.00–0.07)
Basophils Absolute: 0 10*3/uL (ref 0.0–0.1)
Basophils Relative: 0 %
Eosinophils Absolute: 0 10*3/uL (ref 0.0–0.5)
Eosinophils Relative: 0 %
HCT: 30.5 % — ABNORMAL LOW (ref 36.0–46.0)
Hemoglobin: 9.7 g/dL — ABNORMAL LOW (ref 12.0–15.0)
Immature Granulocytes: 1 %
Lymphocytes Relative: 27 %
Lymphs Abs: 1.9 10*3/uL (ref 0.7–4.0)
MCH: 31.3 pg (ref 26.0–34.0)
MCHC: 31.8 g/dL (ref 30.0–36.0)
MCV: 98.4 fL (ref 80.0–100.0)
Monocytes Absolute: 0.6 10*3/uL (ref 0.1–1.0)
Monocytes Relative: 8 %
Neutro Abs: 4.5 10*3/uL (ref 1.7–7.7)
Neutrophils Relative %: 64 %
Platelets: 145 10*3/uL — ABNORMAL LOW (ref 150–400)
RBC: 3.1 MIL/uL — ABNORMAL LOW (ref 3.87–5.11)
RDW: 16.2 % — ABNORMAL HIGH (ref 11.5–15.5)
WBC: 7.1 10*3/uL (ref 4.0–10.5)
nRBC: 0 % (ref 0.0–0.2)

## 2022-09-17 MED ORDER — HEPARIN SOD (PORK) LOCK FLUSH 100 UNIT/ML IV SOLN
500.0000 [IU] | Freq: Once | INTRAVENOUS | Status: AC | PRN
  Administered 2022-09-17: 500 [IU]
  Filled 2022-09-17: qty 5

## 2022-09-17 MED ORDER — SODIUM CHLORIDE 0.9 % IV SOLN
Freq: Once | INTRAVENOUS | Status: AC
  Filled 2022-09-17: qty 250

## 2022-09-17 MED ORDER — SODIUM CHLORIDE 0.9 % IV SOLN
200.0000 mg | Freq: Once | INTRAVENOUS | Status: AC
  Administered 2022-09-17: 200 mg via INTRAVENOUS
  Filled 2022-09-17: qty 8

## 2022-09-17 MED ORDER — PALONOSETRON HCL INJECTION 0.25 MG/5ML
0.2500 mg | Freq: Once | INTRAVENOUS | Status: AC
  Administered 2022-09-17: 0.25 mg via INTRAVENOUS
  Filled 2022-09-17: qty 5

## 2022-09-17 MED ORDER — SODIUM CHLORIDE 0.9 % IV SOLN
10.0000 mg | Freq: Once | INTRAVENOUS | Status: AC
  Administered 2022-09-17: 10 mg via INTRAVENOUS
  Filled 2022-09-17: qty 10

## 2022-09-17 MED ORDER — SODIUM CHLORIDE 0.9 % IV SOLN
375.0000 mg/m2 | Freq: Once | INTRAVENOUS | Status: AC
  Administered 2022-09-17: 600 mg via INTRAVENOUS
  Filled 2022-09-17: qty 24

## 2022-09-17 MED ORDER — SODIUM CHLORIDE 0.9% FLUSH
10.0000 mL | Freq: Once | INTRAVENOUS | Status: AC
  Administered 2022-09-17: 10 mL via INTRAVENOUS
  Filled 2022-09-17: qty 10

## 2022-09-17 MED ORDER — SODIUM CHLORIDE 0.9% FLUSH
10.0000 mL | INTRAVENOUS | Status: AC | PRN
  Administered 2022-09-17: 10 mL
  Filled 2022-09-17: qty 10

## 2022-09-17 NOTE — Progress Notes (Signed)
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  She smoked about 2 packs of cigarettes per day but quit smoking in 2010.  She presented to the ER withSymptoms of significant back pain MRIs lumbar spine without contrast showed pathologic fracture of the L1 vertebral body with posterior bulging of the cortex resulting in mild spinal canal stenosis.  Diffuse T1 hypointense abnormality in the L1 vertebral body with involvement of bilateral pedicles and transverse processes.  CT chest abdomen and pelvis with contrast showed 1 cm left supraclavicular adenopathy.  1 cm right subcarinal lymph node.  Necrotic 3.1 x 2.4 x 2.9 cm anterior mediastinal mass in the left prevascular region with mild mass effect on the brachiocephalic vein.  Possible phrenic nerve involvement.  Mild acute sigmoid colon diverticulitis.    Patient had biopsy of the left supraclavicular lymph node which was consistent with adenocarcinoma of lung primary.  NGS testing did not show any actionable mutations.  K-ras G12 D mutation detected.  MSI stable.  Patient underwent percutaneous fixation of the L1 vertebral body.  She completed palliative radiation treatment to her spine.  She declined adjuvant bisphosphonates.  She received 4 cycles of carbo Alimta Keytruda chemotherapy.  Scan showed partial response and presently patient is on maintenance Alimta and Keytruda  Interval history-she reports some ongoing fatigue but  is otherwise tolerating chemotherapy well without any significant side effects.  She has lost 3 pounds over the last 1 month  ECOG PS- 1 Pain scale- 0 Opioid associated constipation- no  Review of systems- Review of Systems  Constitutional:  Positive for malaise/fatigue. Negative for chills, fever and weight loss.  HENT:  Negative for congestion, ear discharge and nosebleeds.   Eyes:  Negative for blurred vision.  Respiratory:  Negative for cough, hemoptysis, sputum production, shortness of breath and wheezing.   Cardiovascular:  Negative for chest pain, palpitations, orthopnea and claudication.  Gastrointestinal:  Negative for abdominal pain, blood in stool, constipation, diarrhea, heartburn, melena, nausea and vomiting.  Genitourinary:  Negative for dysuria, flank pain, frequency, hematuria and urgency.  Musculoskeletal:  Negative for back pain, joint pain and myalgias.  Skin:  Negative for rash.  Neurological:  Negative for dizziness, tingling, focal weakness, seizures, weakness and headaches.  Endo/Heme/Allergies:  Does not bruise/bleed easily.  Psychiatric/Behavioral:  Negative for depression and suicidal ideas. The patient does not have insomnia.       No Known Allergies   Past Medical History:  Diagnosis Date   Arthritis    hands and feet   Cancer associated pain    Cataract    right   Complication of anesthesia    sometimes have a hard time waking up   Diabetes mellitus    Borderline Diabetes   GERD (gastroesophageal reflux disease)    HTN (hypertension)    Hyperlipidemia    Lung cancer (HCC)    Motion sickness    Post corneal transplant    right     Past Surgical History:  Procedure Laterality Date   APPLICATION  OF INTRAOPERATIVE CT SCAN  02/15/2022   Procedure: APPLICATION OF INTRAOPERATIVE CT SCAN;  Surgeon: Venetia Night, MD;  Location: [facility] ORS;  Service: Neurosurgery;;   BASAL CELL CARCINOMA EXCISION     Dr. Lorn Junes   CARPAL TUNNEL RELEASE      right   COLONOSCOPY WITH PROPOFOL N/A 12/03/2015   Procedure: COLONOSCOPY WITH PROPOFOL;  Surgeon: Scot Jun, MD;  Location: [facility] END[facility]COPY;  Service: Endoscopy;  Laterality: N/A;   COLONOSCOPY WITH PROPOFOL N/A 02/22/2020   Procedure: COLONOSCOPY WITH PROPOFOL;  Surgeon: Regis Bill, MD;  Location: [facility] ENDOSCOPY;  Service: Endoscopy;  Laterality: N/A;   ESOPHAGOGASTRODUODENOSCOPY (EGD) WITH PROPOFOL N/A 02/22/2020   Procedure: ESOPHAGOGASTRODUODENOSCOPY (EGD) WITH PROPOFOL;  Surgeon: Regis Bill, MD;  Location: [facility] ENDOSCOPY;  Service: Endoscopy;  Laterality: N/A;   ESOPHAGOGASTRODUODENOSCOPY (EGD) WITH PROPOFOL N/A 06/07/2022   Procedure: ESOPHAGOGASTRODUODENOSCOPY (EGD) WITH PROPOFOL;  Surgeon: Regis Bill, MD;  Location: [facility] ENDOSCOPY;  Service: Endoscopy;  Laterality: N/A;   HAMMER TOE SURGERY Right 05/23/2019   Procedure: HAMMER TOE CORRECTION;  Surgeon: Gwyneth Revels, DPM;  Location: [facility];  Service: Podiatry;  Laterality: Right;  Diabetic   IR BONE TUMOR(S)RF ABLATION  04/30/2022   IR IMAGING GUIDED PORT INSERTION  04/14/2022   IR KYPHO LUMBAR INC FX REDUCE BONE BX UNI/BIL CANNULATION INC/IMAGING  04/30/2022   IR RADIOLOGIST EVAL & MGMT  03/24/2022   IR RADIOLOGIST EVAL & MGMT  06/23/2022   OSTECTOMY Right 05/23/2019   Procedure: DOUBLE OSTEOTOMY RIGHT;  Surgeon: Gwyneth Revels, DPM;  Location: [facility];  Service: Podiatry;  Laterality: Right;   SKIN CANCER EXCISION      Social History   Socioeconomic History   Marital status: Widowed    Spouse name: Not on file   Number of children: Not on file   Years of education: Not on file   Highest education level: Not on file  Occupational History    Employer: [employer]    Comment: Currently not employed  Tobacco Use   Smoking status: Former    Packs/day: 2.00    Years: 20.00    Additional pack years: 0.00    Total pack years: 40.00    Types: Cigarettes    Quit date: 01/13/2009     Years since quitting: 13.6   Smokeless tobacco: Never  Vaping Use   Vaping Use: Never used  Substance and Sexual Activity   Alcohol use: No   Drug use: No   Sexual activity: Not Currently    Birth control/protection: Post-menopausal  Other Topics Concern   Not on file  Social History Narrative   Not on file   Social Determinants of Health   Financial Resource Strain: Not on file  Food Insecurity: No Food Insecurity (08/18/2022)   Hunger Vital Sign    Worried About Running Out of Food in the Last Year: Never true    Ran Out of Food in the Last Year: Never true  Transportation Needs: No Transportation Needs (08/18/2022)   PRAPARE - Administrator, Civil Service (Medical): No    Lack of Transportation (Non-Medical): No  Physical Activity: Not on file  Stress: Not on file  Social Connections: Not on file  Intimate Partner Violence: Not At Risk (08/18/2022)   Humiliation, Afraid, Rape, and Kick questionnaire    Fear of Current or Ex-Partner: No    Emotionally Abused: No    Physically Abused: No    Sexually Abused: No  Family History  Problem Relation Age of Onset   Heart disease Mother    Heart disease Father    Heart disease Maternal Grandmother    Heart disease Maternal Grandfather    Breast cancer Sister 106     Current Outpatient Medications:    acetaminophen (TYLENOL) 500 MG tablet, Take 500 mg by mouth every 6 (six) hours as needed for moderate pain, mild pain, fever or headache., Disp: , Rfl:    albuterol (PROVENTIL) (2.5 MG/3ML) 0.083% nebulizer solution, Take 3 mLs (2.5 mg total) by nebulization in the morning and at bedtime., Disp: 169 mL, Rfl: 12   Ascorbic Acid (VITAMIN C) 1000 MG tablet, Take 1,000 mg by mouth daily.  , Disp: , Rfl:    carvedilol (COREG) 3.125 MG tablet, Take 3.125 mg by mouth 2 (two) times daily with a meal., Disp: , Rfl:    cyclobenzaprine (FLEXERIL) 5 MG tablet, Take 1 tablet (5 mg total) by mouth 3 (three) times daily as needed  for muscle spasms., Disp: 30 tablet, Rfl: 0   dexamethasone (DECADRON) 4 MG tablet, TAKE 1 TABLET 2 TIMES DAILY STARTING DAY BEFORE PEMETREXED. THEN TAKE 2 TABS DAILY FOR 3 DAYS STARTING DAY AFTER CARBOPLATIN. TAKE WITH FOOD., Disp: 30 tablet, Rfl: 1   Docusate Calcium (STOOL SOFTENER PO), Take 1 capsule by mouth daily as needed (constipation)., Disp: , Rfl:    ELIQUIS 5 MG TABS tablet, TAKE 1 TABLET BY MOUTH TWICE A DAY, Disp: 60 tablet, Rfl: 2   folic acid (FOLVITE) 1 MG tablet, Take 1 tablet (1 mg total) by mouth daily. Start 7 days before pemetrexed chemotherapy. Continue until 21 days after pemetrexed completed., Disp: 100 tablet, Rfl: 3   gabapentin (NEURONTIN) 100 MG capsule, Take 100 mg by mouth 2 (two) times daily., Disp: , Rfl:    lidocaine-prilocaine (EMLA) cream, Apply 1 Application topically as needed., Disp: 30 g, Rfl: 2   LORazepam (ATIVAN) 0.5 MG tablet, Take 1 tablet (0.5 mg total) by mouth every 8 (eight) hours as needed for anxiety., Disp: 30 tablet, Rfl: 0   losartan (COZAAR) 100 MG tablet, Take 100 mg by mouth daily., Disp: , Rfl:    metFORMIN (GLUCOPHAGE) 500 MG tablet, Take 1 tablet (500 mg total) by mouth 2 (two) times daily with a meal., Disp: , Rfl:    Multiple Vitamin (MULTIVITAMIN) capsule, Take 1 capsule by mouth daily.  , Disp: , Rfl:    omeprazole (PRILOSEC) 20 MG capsule, Take 1 capsule (20 mg total) by mouth daily., Disp: 90 capsule, Rfl: 3   ondansetron (ZOFRAN) 8 MG tablet, Take 1 tablet (8 mg total) by mouth every 8 (eight) hours as needed for nausea or vomiting. Start on the third day after carboplatin., Disp: 30 tablet, Rfl: 1   Oxycodone HCl 20 MG TABS, Take 0.5-1 tablets (10-20 mg total) by mouth every 4 (four) hours as needed., Disp: 60 tablet, Rfl: 0   polyethylene glycol (MIRALAX / GLYCOLAX) 17 g packet, Take 17 g by mouth daily., Disp: 14 each, Rfl: 0   prednisoLONE acetate (PRED MILD) 0.12 % ophthalmic suspension, Place 1 drop into both eyes daily., Disp: ,  Rfl:    prochlorperazine (COMPAZINE) 10 MG tablet, Take 1 tablet (10 mg total) by mouth every 6 (six) hours as needed for nausea or vomiting., Disp: 30 tablet, Rfl: 2   simvastatin (ZOCOR) 40 MG tablet, TAKE 1 TABLET EVERY DAY  AT  6:00 PM, Disp: 90 tablet, Rfl: 3   sodium chloride  HYPERTONIC 3 % nebulizer solution, Take by nebulization in the morning and at bedtime., Disp: 750 mL, Rfl: 12   OLANZapine (ZYPREXA) 10 MG tablet, TAKE 1 TABLET BY MOUTH EVERYDAY AT BEDTIME (Patient taking differently: Take 10 mg by mouth at bedtime.), Disp: 90 tablet, Rfl: 1   oxyCODONE ER (XTAMPZA ER) 18 MG C12A, Take 1 capsule by mouth every 12 (twelve) hours., Disp: 60 capsule, Rfl: 0 No current facility-administered medications for this visit.  Facility-Administered Medications Ordered in Other Visits:    0.9 %  sodium chloride infusion, , Intravenous, Continuous, Borders, Daryl Eastern, NP, Stopped at 08/09/22 1434   sodium chloride flush (NS) 0.9 % injection 10 mL, 10 mL, Intracatheter, PRN, Creig Hines, MD, 10 mL at 09/17/22 1112  Physical exam:  Vitals:   09/17/22 0850  BP: (!) 111/51  Pulse: 86  Resp: 19  Temp: (!) 96.6 F (35.9 C)  SpO2: 98%  Weight: 109 lb 14.4 oz (49.9 kg)   Physical Exam Cardiovascular:     Rate and Rhythm: Normal rate and regular rhythm.     Heart sounds: Normal heart sounds.  Pulmonary:     Effort: Pulmonary effort is normal.     Breath sounds: Normal breath sounds.  Abdominal:     General: Bowel sounds are normal.     Palpations: Abdomen is soft.  Skin:    General: Skin is warm and dry.  Neurological:     Mental Status: She is alert and oriented to person, place, and time.         Latest Ref Rng & Units 09/17/2022    8:38 AM  CMP  Glucose 70 - 99 mg/dL 161   BUN 8 - 23 mg/dL 30   Creatinine 0.96 - 1.00 mg/dL 0.45   Sodium 409 - 811 mmol/L 135   Potassium 3.5 - 5.1 mmol/L 4.3   Chloride 98 - 111 mmol/L 107   CO2 22 - 32 mmol/L 23   Calcium 8.9 - 10.3 mg/dL 9.1    Total Protein 6.5 - 8.1 g/dL 6.8   Total Bilirubin 0.3 - 1.2 mg/dL 0.2   Alkaline Phos 38 - 126 U/L 67   AST 15 - 41 U/L 27   ALT 0 - 44 U/L 14       Latest Ref Rng & Units 09/17/2022    8:38 AM  CBC  WBC 4.0 - 10.5 K/uL 7.1   Hemoglobin 12.0 - 15.0 g/dL 9.7   Hematocrit 91.4 - 46.0 % 30.5   Platelets 150 - 400 K/uL 145     No images are attached to the encounter.  [facility]IC[facility] SPEECH PATH  Result Date: 09/01/2022 CLINICAL DATA:  Dysphagia. Cough/GE reflux disease/other secondary diagnosis EXAM: MODIFIED BARIUM SWALLOW TECHNIQUE: Different consistencies of barium were administered orally to the patient by the Speech Pathologist. Imaging of the pharynx was performed in the lateral projection. The radiologist was present in the fluoroscopy room for this study, providing personal supervision. FLUOROSCOPY: Radiation Exposure Index (as provided by the fluoroscopic device): 16 mGy Kerma COMPARISON:  None Available. FINDINGS: Fluoroscopic guidance was provided during modified barium swallow examination performed by speech pathology. IMPRESSION: Fluoroscopic guidance was provided during modified barium swallow examination performed by speech pathology. Please refer to the Speech Pathologists report for complete details and recommendations. Electronically Signed   By: Lupita Raider M.D.   On: 09/01/2022 14:40     Assessment and plan- Patient is a 72 y.o. female with  metastatic lung cancer with bone metastases.  She is here for on treatment assessment prior to cycle 4 of maintenance Alimta and Keytruda  Counts okay to proceed with cycle 4 of maintenance Alimta and Keytruda today.  She will be seen by covering provider in 3 weeks for cycle 5 and I will see her back in 6 weeks for cycle 6 of maintenance Alimta and Keytruda.  I will repeat CT chest abdomen and pelvis with contrast about 2 to 3 weeks from now.  Bone mets: Patient declined Zometa  Patient gets B12 injections every 9  weeks and will be due for her next dose in 3 weeks  Neoplasm related pain: Continue as needed oxycodone and Xtampza ER  Patient will receive 1 L of IV fluids today   Visit Diagnosis 1. Primary malignant neoplasm of right lung metastatic to other site [facility])   2. Encounter for antineoplastic chemotherapy   3. Encounter for antineoplastic immunotherapy      Dr. Owens Shark, MD, MPH [facility] at [facility] [phone] 09/17/2022 12:42 PM

## 2022-09-17 NOTE — Patient Instructions (Addendum)
[place] [facility] AT [facility]  Discharge Instructions: Thank you for choosing [facility] to provide your oncology and hematology care.  If you have a lab appointment with the Cancer Center, please go directly to the Cancer Center and check in at the registration area.  Wear comfortable clothing and clothing appropriate for easy access to any Portacath or PICC line.   We strive to give you quality time with your provider. You may need to reschedule your appointment if you arrive late (15 or more minutes).  Arriving late affects you and other patients whose appointments are after yours.  Also, if you miss three or more appointments without notifying the office, you may be dismissed from the clinic at the provider's discretion.      For prescription refill requests, have your pharmacy contact our office and allow 72 hours for refills to be completed.    Today you received the following chemotherapy and/or immunotherapy agents Keytruda / Alimta    To help prevent nausea and vomiting after your treatment, we encourage you to take your nausea medication as directed.  BELOW ARE SYMPTOMS THAT SHOULD BE REPORTED IMMEDIATELY: *FEVER GREATER THAN 100.4 F (38 C) OR HIGHER *CHILLS OR SWEATING *NAUSEA AND VOMITING THAT IS NOT CONTROLLED WITH YOUR NAUSEA MEDICATION *UNUSUAL SHORTNESS OF BREATH *UNUSUAL BRUISING OR BLEEDING *URINARY PROBLEMS (pain or burning when urinating, or frequent urination) *BOWEL PROBLEMS (unusual diarrhea, constipation, pain near the anus) TENDERNESS IN MOUTH AND THROAT WITH OR WITHOUT PRESENCE OF ULCERS (sore throat, sores in mouth, or a toothache) UNUSUAL RASH, SWELLING OR PAIN  UNUSUAL VAGINAL DISCHARGE OR ITCHING   Items with * indicate a potential emergency and should be followed up as soon as possible or go to the Emergency Department if any problems should occur.  Please show the CHEMOTHERAPY ALERT CARD or IMMUNOTHERAPY ALERT CARD at  check-in to the Emergency Department and triage nurse.  Should you have questions after your visit or need to cancel or reschedule your appointment, please contact [place] [facility]  [phone] and follow the prompts.  Office hours are 8:00 a.m. to 4:30 p.m. Monday - Friday. Please note that voicemails left after 4:00 p.m. may not be returned until the following business day.  We are closed weekends and major holidays. You have access to a nurse at all times for urgent questions. Please call the main number to the clinic [phone] and follow the prompts.  For any non-urgent questions, you may also contact your provider using MyChart. We now offer e-Visits for anyone 79 and older to request care online for non-urgent symptoms. For details visit [email or web address].   Also download the MyChart app! Go to the app store, search "MyChart", open the app, select [place], and log in with your MyChart username and password.

## 2022-09-23 ENCOUNTER — Inpatient Hospital Stay (HOSPITAL_BASED_OUTPATIENT_CLINIC_OR_DEPARTMENT_OTHER): Payer: Medicare HMO | Admitting: Nurse Practitioner

## 2022-09-23 ENCOUNTER — Ambulatory Visit
Admission: RE | Admit: 2022-09-23 | Discharge: 2022-09-23 | Disposition: A | Discharge status: Home / Self Care | Payer: Medicare HMO | Source: Ambulatory Visit | Attending: Nurse Practitioner | Admitting: Nurse Practitioner

## 2022-09-23 ENCOUNTER — Emergency Department
Admission: EM | Admit: 2022-09-23 | Discharge: 2022-09-24 | Discharge status: Against Medical Advice | Payer: Medicare HMO | Attending: Emergency Medicine | Admitting: Emergency Medicine

## 2022-09-23 ENCOUNTER — Inpatient Hospital Stay: Payer: Medicare HMO

## 2022-09-23 ENCOUNTER — Encounter: Payer: Self-pay | Admitting: Nurse Practitioner

## 2022-09-23 ENCOUNTER — Telehealth: Payer: Self-pay | Admitting: *Deleted

## 2022-09-23 ENCOUNTER — Other Ambulatory Visit: Payer: Self-pay | Admitting: Oncology

## 2022-09-23 VITALS — BP 113/64 | HR 103 | Temp 97.7°F | Wt 110.0 lb

## 2022-09-23 DIAGNOSIS — C3491 Malignant neoplasm of unspecified part of right bronchus or lung: Secondary | ICD-10-CM

## 2022-09-23 DIAGNOSIS — R7989 Other specified abnormal findings of blood chemistry: Secondary | ICD-10-CM

## 2022-09-23 DIAGNOSIS — R509 Fever, unspecified: Secondary | ICD-10-CM | POA: Diagnosis present

## 2022-09-23 DIAGNOSIS — Z5321 Procedure and treatment not carried out due to patient leaving prior to being seen by health care provider: Secondary | ICD-10-CM | POA: Insufficient documentation

## 2022-09-23 DIAGNOSIS — G8929 Other chronic pain: Secondary | ICD-10-CM | POA: Diagnosis not present

## 2022-09-23 DIAGNOSIS — R059 Cough, unspecified: Secondary | ICD-10-CM | POA: Diagnosis not present

## 2022-09-23 DIAGNOSIS — M549 Dorsalgia, unspecified: Secondary | ICD-10-CM | POA: Insufficient documentation

## 2022-09-23 DIAGNOSIS — R0602 Shortness of breath: Secondary | ICD-10-CM

## 2022-09-23 DIAGNOSIS — Z5111 Encounter for antineoplastic chemotherapy: Secondary | ICD-10-CM | POA: Diagnosis not present

## 2022-09-23 DIAGNOSIS — Z85118 Personal history of other malignant neoplasm of bronchus and lung: Secondary | ICD-10-CM | POA: Insufficient documentation

## 2022-09-23 LAB — URINALYSIS, COMPLETE (UACMP) WITH MICROSCOPIC
Bilirubin Urine: NEGATIVE
Glucose, UA: NEGATIVE mg/dL
Hgb urine dipstick: NEGATIVE
Ketones, ur: NEGATIVE mg/dL
Nitrite: NEGATIVE
Protein, ur: NEGATIVE mg/dL
Specific Gravity, Urine: 1.013 (ref 1.005–1.030)
pH: 6 (ref 5.0–8.0)

## 2022-09-23 LAB — CBC WITH DIFFERENTIAL (CANCER CENTER ONLY)
Abs Immature Granulocytes: 0.02 10*3/uL (ref 0.00–0.07)
Basophils Absolute: 0 10*3/uL (ref 0.0–0.1)
Basophils Relative: 0 %
Eosinophils Absolute: 0.1 10*3/uL (ref 0.0–0.5)
Eosinophils Relative: 1 %
HCT: 32.3 % — ABNORMAL LOW (ref 36.0–46.0)
Hemoglobin: 10.3 g/dL — ABNORMAL LOW (ref 12.0–15.0)
Immature Granulocytes: 0 %
Lymphocytes Relative: 21 %
Lymphs Abs: 1.1 10*3/uL (ref 0.7–4.0)
MCH: 31.4 pg (ref 26.0–34.0)
MCHC: 31.9 g/dL (ref 30.0–36.0)
MCV: 98.5 fL (ref 80.0–100.0)
Monocytes Absolute: 0 10*3/uL — ABNORMAL LOW (ref 0.1–1.0)
Monocytes Relative: 0 %
Neutro Abs: 4.1 10*3/uL (ref 1.7–7.7)
Neutrophils Relative %: 78 %
Platelet Count: 132 10*3/uL — ABNORMAL LOW (ref 150–400)
RBC: 3.28 MIL/uL — ABNORMAL LOW (ref 3.87–5.11)
RDW: 15.5 % (ref 11.5–15.5)
WBC Count: 5.3 10*3/uL (ref 4.0–10.5)
nRBC: 0 % (ref 0.0–0.2)

## 2022-09-23 LAB — CMP (CANCER CENTER ONLY)
ALT: 23 U/L (ref 0–44)
AST: 27 U/L (ref 15–41)
Albumin: 3.4 g/dL — ABNORMAL LOW (ref 3.5–5.0)
Alkaline Phosphatase: 77 U/L (ref 38–126)
Anion gap: 9 (ref 5–15)
BUN: 36 mg/dL — ABNORMAL HIGH (ref 8–23)
CO2: 24 mmol/L (ref 22–32)
Calcium: 9.1 mg/dL (ref 8.9–10.3)
Chloride: 105 mmol/L (ref 98–111)
Creatinine: 1.09 mg/dL — ABNORMAL HIGH (ref 0.44–1.00)
GFR, Estimated: 54 mL/min — ABNORMAL LOW (ref >60)
Glucose, Bld: 142 mg/dL — ABNORMAL HIGH (ref 70–99)
Potassium: 4.5 mmol/L (ref 3.5–5.1)
Sodium: 138 mmol/L (ref 135–145)
Total Bilirubin: 0.3 mg/dL (ref 0.3–1.2)
Total Protein: 7.1 g/dL (ref 6.5–8.1)

## 2022-09-23 NOTE — Progress Notes (Signed)
Symptom Management Clinic  [facility] A Department of the [place]. [facility] [address] [place], [place] [address] [phone] (phone) [phone] (fax)  Patient Care Team: Dortha Kern, MD as PCP - General (Family Medicine) Glory Buff, RN as Oncology Nurse Navigator   Name of the patient: Amber Glenn  [id number]  [birth year]   Date of visit: 09/23/22  Diagnosis- Stage IV Lung Cancer  Chief complaint/ Reason for visit- somnolence, cough, congestion  Heme/Onc history:  Oncology History  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 site [facility])  03/03/2022 Initial Diagnosis   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 site [facility])   03/03/2022 Cancer Staging   Staging form: Lung, AJCC 8th Edition - Clinical stage from 03/03/2022: Stage IV (cT2, cN3, pM1) - Signed by Creig Hines, MD on 03/03/2022   04/16/2022 -  Chemotherapy   Patient is on Treatment Plan : LUNG Carboplatin (5) + Pemetrexed (500) + Pembrolizumab (200) D1 q21d Induction x 4 cycles / Maintenance Pemetrexed (500) + Pembrolizumab (200) D1 q21d       Interval history- Patient's granddaughter brings patient to clinic for concerns of increased cough, somnolence and sputum. Over past week patient has been experiencing reduced appetite and increasingly tired. Family reports increased cough and sputum. No fevers or chills. Family is concerned d/t he rhistory of aspiration that she may be experiencing recurrent aspiration pneumonia. They have planned to go out of town this weekend and are worried about illness. She was last treated a month ago for possible diverticulitis and UTI in ER after a fall. No evidence of pneumonia at that time but broad spectrum coverage was received.   ECOG FS:3 - Symptomatic, >50% confined to bed  Review of systems- Review of Systems  Constitutional:  Positive for malaise/fatigue. Negative for  chills, fever and weight loss.  HENT:  Negative for hearing loss, nosebleeds, sore throat and tinnitus.   Eyes:  Negative for blurred vision and double vision.  Respiratory:  Positive for cough and sputum production. Negative for hemoptysis, shortness of breath and wheezing.   Cardiovascular:  Negative for chest pain, palpitations and leg swelling.  Gastrointestinal:  Negative for abdominal pain, blood in stool, constipation, diarrhea, melena, nausea and vomiting.  Genitourinary:  Negative for dysuria, frequency and urgency.  Musculoskeletal:  Positive for back pain. Negative for falls, joint pain and myalgias.  Skin:  Negative for itching and rash.  Neurological:  Positive for weakness. Negative for dizziness, tingling, sensory change, loss of consciousness and headaches.  Endo/Heme/Allergies:  Negative for environmental allergies. Does not bruise/bleed easily.  Psychiatric/Behavioral:  Negative for depression. The patient is not nervous/anxious and does not have insomnia.     Current treatment- alimta-keytruda maintenance  No Known Allergies  Past Medical History:  Diagnosis Date   Arthritis    hands and feet   Cancer associated pain    Cataract    right   Complication of anesthesia    sometimes have a hard time waking up   Diabetes mellitus    Borderline Diabetes   GERD (gastroesophageal reflux disease)    HTN (hypertension)    Hyperlipidemia    Lung cancer (HCC)    Motion sickness    Post corneal transplant    right    Past Surgical History:  Procedure Laterality Date   APPLICATION OF INTRAOPERATIVE CT SCAN  02/15/2022   Procedure: APPLICATION OF INTRAOPERATIVE CT SCAN;  Surgeon:  Venetia Night, MD;  Location: [facility] ORS;  Service: Neurosurgery;;   BASAL CELL CARCINOMA EXCISION     Dr. Lorn Junes   CARPAL TUNNEL RELEASE     right   COLONOSCOPY WITH PROPOFOL N/A 12/03/2015   Procedure: COLONOSCOPY WITH PROPOFOL;  Surgeon: Scot Jun, MD;  Location: [facility] END[facility]COPY;   Service: Endoscopy;  Laterality: N/A;   COLONOSCOPY WITH PROPOFOL N/A 02/22/2020   Procedure: COLONOSCOPY WITH PROPOFOL;  Surgeon: Regis Bill, MD;  Location: [facility] ENDOSCOPY;  Service: Endoscopy;  Laterality: N/A;   ESOPHAGOGASTRODUODENOSCOPY (EGD) WITH PROPOFOL N/A 02/22/2020   Procedure: ESOPHAGOGASTRODUODENOSCOPY (EGD) WITH PROPOFOL;  Surgeon: Regis Bill, MD;  Location: [facility] ENDOSCOPY;  Service: Endoscopy;  Laterality: N/A;   ESOPHAGOGASTRODUODENOSCOPY (EGD) WITH PROPOFOL N/A 06/07/2022   Procedure: ESOPHAGOGASTRODUODENOSCOPY (EGD) WITH PROPOFOL;  Surgeon: Regis Bill, MD;  Location: [facility] ENDOSCOPY;  Service: Endoscopy;  Laterality: N/A;   HAMMER TOE SURGERY Right 05/23/2019   Procedure: HAMMER TOE CORRECTION;  Surgeon: Gwyneth Revels, DPM;  Location: [facility];  Service: Podiatry;  Laterality: Right;  Diabetic   IR BONE TUMOR(S)RF ABLATION  04/30/2022   IR IMAGING GUIDED PORT INSERTION  04/14/2022   IR KYPHO LUMBAR INC FX REDUCE BONE BX UNI/BIL CANNULATION INC/IMAGING  04/30/2022   IR RADIOLOGIST EVAL & MGMT  03/24/2022   IR RADIOLOGIST EVAL & MGMT  06/23/2022   OSTECTOMY Right 05/23/2019   Procedure: DOUBLE OSTEOTOMY RIGHT;  Surgeon: Gwyneth Revels, DPM;  Location: [facility];  Service: Podiatry;  Laterality: Right;   SKIN CANCER EXCISION      Social History   Socioeconomic History   Marital status: Widowed    Spouse name: Not on file   Number of children: Not on file   Years of education: Not on file   Highest education level: Not on file  Occupational History    Employer: [employer]    Comment: Currently not employed  Tobacco Use   Smoking status: Former    Packs/day: 2.00    Years: 20.00    Additional pack years: 0.00    Total pack years: 40.00    Types: Cigarettes    Quit date: 01/13/2009    Years since quitting: 13.7   Smokeless tobacco: Never  Vaping Use   Vaping Use: Never used  Substance and Sexual Activity   Alcohol use: No    Drug use: No   Sexual activity: Not Currently    Birth control/protection: Post-menopausal  Other Topics Concern   Not on file  Social History Narrative   Not on file   Social Determinants of Health   Financial Resource Strain: Not on file  Food Insecurity: No Food Insecurity (08/18/2022)   Hunger Vital Sign    Worried About Running Out of Food in the Last Year: Never true    Ran Out of Food in the Last Year: Never true  Transportation Needs: No Transportation Needs (08/18/2022)   PRAPARE - Administrator, Civil Service (Medical): No    Lack of Transportation (Non-Medical): No  Physical Activity: Not on file  Stress: Not on file  Social Connections: Not on file  Intimate Partner Violence: Not At Risk (08/18/2022)   Humiliation, Afraid, Rape, and Kick questionnaire    Fear of Current or Ex-Partner: No    Emotionally Abused: No    Physically Abused: No    Sexually Abused: No    Family History  Problem Relation Age of Onset   Heart disease Mother  Heart disease Father    Heart disease Maternal Grandmother    Heart disease Maternal Grandfather    Breast cancer Sister 90     Current Outpatient Medications:    acetaminophen (TYLENOL) 500 MG tablet, Take 500 mg by mouth every 6 (six) hours as needed for moderate pain, mild pain, fever or headache., Disp: , Rfl:    albuterol (PROVENTIL) (2.5 MG/3ML) 0.083% nebulizer solution, Take 3 mLs (2.5 mg total) by nebulization in the morning and at bedtime., Disp: 169 mL, Rfl: 12   Ascorbic Acid (VITAMIN C) 1000 MG tablet, Take 1,000 mg by mouth daily.  , Disp: , Rfl:    carvedilol (COREG) 3.125 MG tablet, Take 3.125 mg by mouth 2 (two) times daily with a meal., Disp: , Rfl:    cyclobenzaprine (FLEXERIL) 5 MG tablet, Take 1 tablet (5 mg total) by mouth 3 (three) times daily as needed for muscle spasms., Disp: 30 tablet, Rfl: 0   dexamethasone (DECADRON) 4 MG tablet, TAKE 1 TABLET 2 TIMES DAILY STARTING DAY BEFORE PEMETREXED. THEN  TAKE 2 TABS DAILY FOR 3 DAYS STARTING DAY AFTER CARBOPLATIN. TAKE WITH FOOD., Disp: 30 tablet, Rfl: 1   Docusate Calcium (STOOL SOFTENER PO), Take 1 capsule by mouth daily as needed (constipation)., Disp: , Rfl:    ELIQUIS 5 MG TABS tablet, TAKE 1 TABLET BY MOUTH TWICE A DAY, Disp: 60 tablet, Rfl: 2   folic acid (FOLVITE) 1 MG tablet, Take 1 tablet (1 mg total) by mouth daily. Start 7 days before pemetrexed chemotherapy. Continue until 21 days after pemetrexed completed., Disp: 100 tablet, Rfl: 3   gabapentin (NEURONTIN) 100 MG capsule, Take 100 mg by mouth 2 (two) times daily., Disp: , Rfl:    lidocaine-prilocaine (EMLA) cream, Apply 1 Application topically as needed., Disp: 30 g, Rfl: 2   LORazepam (ATIVAN) 0.5 MG tablet, Take 1 tablet (0.5 mg total) by mouth every 8 (eight) hours as needed for anxiety., Disp: 30 tablet, Rfl: 0   losartan (COZAAR) 100 MG tablet, Take 100 mg by mouth daily., Disp: , Rfl:    metFORMIN (GLUCOPHAGE) 500 MG tablet, Take 1 tablet (500 mg total) by mouth 2 (two) times daily with a meal., Disp: , Rfl:    Multiple Vitamin (MULTIVITAMIN) capsule, Take 1 capsule by mouth daily.  , Disp: , Rfl:    omeprazole (PRILOSEC) 20 MG capsule, Take 1 capsule (20 mg total) by mouth daily., Disp: 90 capsule, Rfl: 3   ondansetron (ZOFRAN) 8 MG tablet, Take 1 tablet (8 mg total) by mouth every 8 (eight) hours as needed for nausea or vomiting. Start on the third day after carboplatin., Disp: 30 tablet, Rfl: 1   Oxycodone HCl 20 MG TABS, Take 0.5-1 tablets (10-20 mg total) by mouth every 4 (four) hours as needed., Disp: 60 tablet, Rfl: 0   polyethylene glycol (MIRALAX / GLYCOLAX) 17 g packet, Take 17 g by mouth daily., Disp: 14 each, Rfl: 0   prednisoLONE acetate (PRED MILD) 0.12 % ophthalmic suspension, Place 1 drop into both eyes daily., Disp: , Rfl:    prochlorperazine (COMPAZINE) 10 MG tablet, Take 1 tablet (10 mg total) by mouth every 6 (six) hours as needed for nausea or vomiting., Disp:  30 tablet, Rfl: 2   simvastatin (ZOCOR) 40 MG tablet, TAKE 1 TABLET EVERY DAY  AT  6:00 PM, Disp: 90 tablet, Rfl: 3   sodium chloride HYPERTONIC 3 % nebulizer solution, Take by nebulization in the morning and at bedtime., Disp: 750  mL, Rfl: 12   OLANZapine (ZYPREXA) 10 MG tablet, TAKE 1 TABLET BY MOUTH EVERYDAY AT BEDTIME (Patient taking differently: Take 10 mg by mouth at bedtime.), Disp: 90 tablet, Rfl: 1   oxyCODONE ER (XTAMPZA ER) 18 MG C12A, Take 1 capsule by mouth every 12 (twelve) hours., Disp: 60 capsule, Rfl: 0 No current facility-administered medications for this visit.  Facility-Administered Medications Ordered in Other Visits:    0.9 %  sodium chloride infusion, , Intravenous, Continuous, Borders, Daryl Eastern, NP, Stopped at 08/09/22 1434   sodium chloride flush (NS) 0.9 % injection 10 mL, 10 mL, Intracatheter, PRN, Creig Hines, MD, 10 mL at 09/17/22 1112  Physical exam:  Vitals:   09/23/22 1348  BP: 113/64  Pulse: (!) 103  Temp: 97.7 F (36.5 C)  TempSrc: Tympanic  SpO2: 98%  Weight: 110 lb (49.9 kg)   Physical Exam Vitals reviewed.  Constitutional:      Appearance: She is not ill-appearing.     Comments: Frail appearing. Ambulates w/o aids. Granddaughter accompanies. Thin build.   HENT:     Right Ear: External ear normal.     Left Ear: External ear normal.     Nose: No congestion or rhinorrhea.     Mouth/Throat:     Mouth: Mucous membranes are moist.     Pharynx: No oropharyngeal exudate.  Cardiovascular:     Rate and Rhythm: Regular rhythm. Tachycardia present.  Pulmonary:     Effort: Pulmonary effort is normal. No respiratory distress.     Breath sounds: No wheezing, rhonchi or rales.     Comments: No cough heard Abdominal:     General: There is no distension.     Tenderness: There is no abdominal tenderness.  Musculoskeletal:        General: No deformity.  Lymphadenopathy:     Cervical: No cervical adenopathy.  Skin:    General: Skin is warm and dry.      Coloration: Skin is not pale.  Neurological:     Mental Status: She is alert and oriented to person, place, and time.  Psychiatric:        Mood and Affect: Mood normal.        Behavior: Behavior normal.         Latest Ref Rng & Units 09/23/2022    1:34 PM  CMP  Glucose 70 - 99 mg/dL 161   BUN 8 - 23 mg/dL 36   Creatinine 0.96 - 1.00 mg/dL 0.45   Sodium 409 - 811 mmol/L 138   Potassium 3.5 - 5.1 mmol/L 4.5   Chloride 98 - 111 mmol/L 105   CO2 22 - 32 mmol/L 24   Calcium 8.9 - 10.3 mg/dL 9.1   Total Protein 6.5 - 8.1 g/dL 7.1   Total Bilirubin 0.3 - 1.2 mg/dL 0.3   Alkaline Phos 38 - 126 U/L 77   AST 15 - 41 U/L 27   ALT 0 - 44 U/L 23       Latest Ref Rng & Units 09/23/2022    1:34 PM  CBC  WBC 4.0 - 10.5 K/uL 5.3   Hemoglobin 12.0 - 15.0 g/dL 91.4   Hematocrit 78.2 - 46.0 % 32.3   Platelets 150 - 400 K/uL 132     No images are attached to the encounter.  DG Chest 2 View  Result Date: 09/23/2022 CLINICAL DATA:  Shortness of breath.  Lung cancer EXAM: CHEST - 2 VIEW COMPARISON:  08/17/2022 FINDINGS: Low volume  chest with streaky density at the left more than right base. No effusion or pneumothorax. Normal heart size. Porta catheter with tip at the upper cavoatrial junction. IMPRESSION: Unchanged low volume chest with scarring/atelectasis on the left more than right. No acute finding compared to 08/17/2022. Electronically Signed   By: Tiburcio Pea M.D.   On: 09/23/2022 13:56   DG SWALLOW FUNC OP MEDICARE SPEECH PATH  Result Date: 09/01/2022 CLINICAL DATA:  Dysphagia. Cough/GE reflux disease/other secondary diagnosis EXAM: MODIFIED BARIUM SWALLOW TECHNIQUE: Different consistencies of barium were administered orally to the patient by the Speech Pathologist. Imaging of the pharynx was performed in the lateral projection. The radiologist was present in the fluoroscopy room for this study, providing personal supervision. FLUOROSCOPY: Radiation Exposure Index (as provided by  the fluoroscopic device): 16 mGy Kerma COMPARISON:  None Available. FINDINGS: Fluoroscopic guidance was provided during modified barium swallow examination performed by speech pathology. IMPRESSION: Fluoroscopic guidance was provided during modified barium swallow examination performed by speech pathology. Please refer to the Speech Pathologists report for complete details and recommendations. Electronically Signed   By: Lupita Raider M.D.   On: 09/01/2022 14:40    Assessment and plan- Patient is a 72 y.o. female diagnosed with stage IV lung cancer, on maintenance alimta-keytruda who presents to symptom management clinic for concerns of possible aspiration pneumonia with increased somnolence and cough.   No signs of infection though she is certainly at high risk. No leukocytosis, afebrile. Lungs are clear. Chest xray was negative. I checked ua given her history of incidental UTI and insidious symptoms but this too was also negative. She has had antibiotics within past month. For now, I'd like to cautiously monitor her symptoms. If she begins to worsen symptomatically, could consider empiric treatment. She has CT scheduled for next week which may also be revealing from malignancy standpoint.   Follow up as scheduled. RTC sooner if symptoms do not improve or worsen.    Visit Diagnosis 1. Primary malignant neoplasm of right lung metastatic to other site [facility])   2. Cough, unspecified type     Patient expressed understanding and was in agreement with this plan. She also understands that She can call clinic at any time with any questions, concerns, or complaints.   Thank you for allowing me to participate in the care of this very pleasant patient.   Consuello Masse, DNP, AGNP-C, AOCNP [facility] [phone]  CC: Dr Smith Robert

## 2022-09-23 NOTE — ED Triage Notes (Signed)
Pt arrived POV for fever. Temp 101.2, did not treat PTA. 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 Daughter reports pt has not been acting her normal today, has been flat affect and AMD throughout the day as prior dx with PNA. LKW unknown. Pt was seen at cancer center earlier today, had CXR, labs and urine there, no ABX prescribe. Afebrile at current 98.5, VSS, pt reports chronic back pain.

## 2022-09-23 NOTE — Telephone Encounter (Signed)
Dex taper cancelled with pharmacy. Will reach out to [facility] team to schedule visit.

## 2022-09-23 NOTE — Telephone Encounter (Signed)
Received call from pt's daughter, Toniann Fail, concerned that pt may be developing another infection which may require antibiotics. Reports that pt is somnolent through out the day and during breathing treatments. Pt reports not feeling well. Daughter didn't know if pt needs antibiotics or needs to be seen. Pt is going out of town this weekend.   Please advise.

## 2022-09-23 NOTE — Telephone Encounter (Signed)
She needs to be seen in [facility]. I accidentally renewed her steroid taper. Can you please call pharmacy and cancel that?

## 2022-09-23 NOTE — Addendum Note (Signed)
Addended by: Keitha Butte on: 09/23/2022 11:45 AM   Modules accepted: Orders

## 2022-09-24 LAB — URINE CULTURE: Culture: <10000 — AB

## 2022-09-27 ENCOUNTER — Ambulatory Visit: Admission: RE | Admit: 2022-09-27 | Payer: Medicare HMO | Source: Ambulatory Visit

## 2022-10-01 ENCOUNTER — Ambulatory Visit: Payer: Medicare HMO

## 2022-10-05 ENCOUNTER — Ambulatory Visit
Admission: RE | Admit: 2022-10-05 | Discharge: 2022-10-05 | Disposition: A | Discharge status: Home / Self Care | Payer: Medicare HMO | Source: Ambulatory Visit | Attending: Oncology | Admitting: Oncology

## 2022-10-05 DIAGNOSIS — C3491 Malignant neoplasm of unspecified part of right bronchus or lung: Secondary | ICD-10-CM | POA: Insufficient documentation

## 2022-10-05 MED ORDER — IOHEXOL 300 MG/ML  SOLN
100.0000 mL | Freq: Once | INTRAMUSCULAR | Status: AC | PRN
  Administered 2022-10-05: 75 mL via INTRAVENOUS

## 2022-10-07 MED FILL — Dexamethasone Sodium Phosphate Inj 100 MG/10ML: INTRAMUSCULAR | Qty: 1 | Status: AC

## 2022-10-08 ENCOUNTER — Inpatient Hospital Stay (HOSPITAL_BASED_OUTPATIENT_CLINIC_OR_DEPARTMENT_OTHER): Payer: Medicare HMO | Admitting: Nurse Practitioner

## 2022-10-08 ENCOUNTER — Other Ambulatory Visit: Payer: Self-pay

## 2022-10-08 ENCOUNTER — Inpatient Hospital Stay: Payer: Medicare HMO

## 2022-10-08 ENCOUNTER — Encounter: Payer: Self-pay | Admitting: Nurse Practitioner

## 2022-10-08 VITALS — BP 124/69 | HR 98 | Temp 96.6°F | Wt 108.6 lb

## 2022-10-08 DIAGNOSIS — Z5111 Encounter for antineoplastic chemotherapy: Secondary | ICD-10-CM | POA: Diagnosis not present

## 2022-10-08 DIAGNOSIS — D509 Iron deficiency anemia, unspecified: Secondary | ICD-10-CM

## 2022-10-08 DIAGNOSIS — C3491 Malignant neoplasm of unspecified part of right bronchus or lung: Secondary | ICD-10-CM

## 2022-10-08 DIAGNOSIS — Z5112 Encounter for antineoplastic immunotherapy: Secondary | ICD-10-CM

## 2022-10-08 DIAGNOSIS — R112 Nausea with vomiting, unspecified: Secondary | ICD-10-CM

## 2022-10-08 LAB — COMPREHENSIVE METABOLIC PANEL
ALT: 19 U/L (ref 0–44)
AST: 29 U/L (ref 15–41)
Albumin: 3.2 g/dL — ABNORMAL LOW (ref 3.5–5.0)
Alkaline Phosphatase: 87 U/L (ref 38–126)
Anion gap: 11 (ref 5–15)
BUN: 23 mg/dL (ref 8–23)
CO2: 22 mmol/L (ref 22–32)
Calcium: 8.6 mg/dL — ABNORMAL LOW (ref 8.9–10.3)
Chloride: 105 mmol/L (ref 98–111)
Creatinine, Ser: 1.55 mg/dL — ABNORMAL HIGH (ref 0.44–1.00)
GFR, Estimated: 35 mL/min — ABNORMAL LOW (ref >60)
Glucose, Bld: 106 mg/dL — ABNORMAL HIGH (ref 70–99)
Potassium: 4 mmol/L (ref 3.5–5.1)
Sodium: 138 mmol/L (ref 135–145)
Total Bilirubin: 0.1 mg/dL — ABNORMAL LOW (ref 0.3–1.2)
Total Protein: 6.8 g/dL (ref 6.5–8.1)

## 2022-10-08 LAB — CBC WITH DIFFERENTIAL/PLATELET
Abs Immature Granulocytes: 0.46 10*3/uL — ABNORMAL HIGH (ref 0.00–0.07)
Basophils Absolute: 0 10*3/uL (ref 0.0–0.1)
Basophils Relative: 1 %
Eosinophils Absolute: 0 10*3/uL (ref 0.0–0.5)
Eosinophils Relative: 0 %
HCT: 25.6 % — ABNORMAL LOW (ref 36.0–46.0)
Hemoglobin: 8.5 g/dL — ABNORMAL LOW (ref 12.0–15.0)
Immature Granulocytes: 7 %
Lymphocytes Relative: 23 %
Lymphs Abs: 1.5 10*3/uL (ref 0.7–4.0)
MCH: 32.7 pg (ref 26.0–34.0)
MCHC: 33.2 g/dL (ref 30.0–36.0)
MCV: 98.5 fL (ref 80.0–100.0)
Monocytes Absolute: 0.7 10*3/uL (ref 0.1–1.0)
Monocytes Relative: 11 %
Neutro Abs: 3.9 10*3/uL (ref 1.7–7.7)
Neutrophils Relative %: 58 %
Platelets: 284 10*3/uL (ref 150–400)
RBC: 2.6 MIL/uL — ABNORMAL LOW (ref 3.87–5.11)
RDW: 16.1 % — ABNORMAL HIGH (ref 11.5–15.5)
Smear Review: NORMAL
WBC: 6.7 10*3/uL (ref 4.0–10.5)
nRBC: 0 % (ref 0.0–0.2)

## 2022-10-08 LAB — IRON AND TIBC
Iron: 85 ug/dL (ref 28–170)
Saturation Ratios: 25 % (ref 10.4–31.8)
TIBC: 336 ug/dL (ref 250–450)
UIBC: 251 ug/dL

## 2022-10-08 LAB — TSH: TSH: 1.419 u[IU]/mL (ref 0.350–4.500)

## 2022-10-08 LAB — FERRITIN: Ferritin: 632 ng/mL — ABNORMAL HIGH (ref 11–307)

## 2022-10-08 MED ORDER — PROCHLORPERAZINE MALEATE 10 MG PO TABS
10.0000 mg | ORAL_TABLET | Freq: Four times a day (QID) | ORAL | 2 refills | Status: DC | PRN
Start: 2022-10-08 — End: 2022-11-02

## 2022-10-08 MED ORDER — SODIUM CHLORIDE 0.9 % IV SOLN
10.0000 mg | Freq: Once | INTRAVENOUS | Status: AC
  Administered 2022-10-08: 10 mg via INTRAVENOUS
  Filled 2022-10-08: qty 10

## 2022-10-08 MED ORDER — SODIUM CHLORIDE 0.9 % IV SOLN
Freq: Once | INTRAVENOUS | Status: AC
  Filled 2022-10-08: qty 250

## 2022-10-08 MED ORDER — SODIUM CHLORIDE 0.9 % IV SOLN
200.0000 mg | Freq: Once | INTRAVENOUS | Status: AC
  Administered 2022-10-08: 200 mg via INTRAVENOUS
  Filled 2022-10-08: qty 8

## 2022-10-08 MED ORDER — SODIUM CHLORIDE 0.9 % IV SOLN
375.0000 mg/m2 | Freq: Once | INTRAVENOUS | Status: AC
  Administered 2022-10-08: 500 mg via INTRAVENOUS
  Filled 2022-10-08: qty 20

## 2022-10-08 MED ORDER — CYANOCOBALAMIN 1000 MCG/ML IJ SOLN
1000.0000 ug | Freq: Once | INTRAMUSCULAR | Status: AC
  Administered 2022-10-08: 1000 ug via INTRAMUSCULAR
  Filled 2022-10-08: qty 1

## 2022-10-08 MED ORDER — SODIUM CHLORIDE 0.9% FLUSH
10.0000 mL | Freq: Once | INTRAVENOUS | Status: AC
  Administered 2022-10-08: 10 mL via INTRAVENOUS
  Filled 2022-10-08: qty 10

## 2022-10-08 MED ORDER — OXYCODONE HCL 20 MG PO TABS
10.0000 mg | ORAL_TABLET | ORAL | 0 refills | Status: DC | PRN
Start: 2022-10-08 — End: 2022-11-01

## 2022-10-08 MED ORDER — HEPARIN SOD (PORK) LOCK FLUSH 100 UNIT/ML IV SOLN
500.0000 [IU] | Freq: Once | INTRAVENOUS | Status: DC
  Filled 2022-10-08: qty 5

## 2022-10-08 MED ORDER — HEPARIN SOD (PORK) LOCK FLUSH 100 UNIT/ML IV SOLN
500.0000 [IU] | Freq: Once | INTRAVENOUS | Status: AC | PRN
  Administered 2022-10-08: 500 [IU]
  Filled 2022-10-08: qty 5

## 2022-10-08 MED ORDER — ONDANSETRON HCL 8 MG PO TABS
8.0000 mg | ORAL_TABLET | Freq: Three times a day (TID) | ORAL | 1 refills | Status: DC | PRN
Start: 2022-10-08 — End: 2022-11-02

## 2022-10-08 MED ORDER — PALONOSETRON HCL INJECTION 0.25 MG/5ML
0.2500 mg | Freq: Once | INTRAVENOUS | Status: AC
  Administered 2022-10-08: 0.25 mg via INTRAVENOUS
  Filled 2022-10-08: qty 5

## 2022-10-08 NOTE — Progress Notes (Signed)
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  She smoked about 2 packs of cigarettes per day but quit smoking in 2010.  She presented to the ER withSymptoms of significant back pain MRIs lumbar spine without contrast showed pathologic fracture of the L1 vertebral body with posterior bulging of the cortex resulting in mild spinal canal stenosis.  Diffuse T1 hypointense abnormality in the L1 vertebral body with involvement of bilateral pedicles and transverse processes.  CT chest abdomen and pelvis with contrast showed 1 cm left supraclavicular adenopathy.  1 cm right subcarinal lymph node.  Necrotic 3.1 x 2.4 x 2.9 cm anterior mediastinal mass in the left prevascular region with mild mass effect on the brachiocephalic vein.  Possible phrenic nerve involvement.  Mild acute sigmoid colon diverticulitis.    Patient had biopsy of the left supraclavicular lymph node which was consistent with adenocarcinoma of lung primary.  NGS testing did not show any actionable mutations.  K-ras G12 D mutation detected.  MSI stable.  Patient underwent percutaneous fixation of the L1 vertebral body.  She completed palliative radiation treatment to her spine.  She declined adjuvant bisphosphonates.  She received 4 cycles of carbo Alimta Keytruda chemotherapy.  Scan showed partial response and presently patient is on maintenance Alimta and Keytruda  Interval history- Continues to have fatigue but  gradually improving as she recovers from chemotherapy. No rashes, diarrhea, or shortness of breath. Denies cough. Appetite and weight are roughly stable.   ECOG PS- 1 Pain scale- 0 Opioid associated constipation- no  Review of systems- Review of Systems  Constitutional:  Positive for malaise/fatigue. Negative for chills, fever and weight loss.  HENT:  Negative for congestion, ear discharge and nosebleeds.   Eyes:  Negative for blurred vision.  Respiratory:  Negative for cough, hemoptysis, sputum production, shortness of breath and wheezing.   Cardiovascular:  Negative for chest pain, palpitations, orthopnea and claudication.  Gastrointestinal:  Negative for abdominal pain, blood in stool, constipation, diarrhea, heartburn, melena, nausea and vomiting.  Genitourinary:  Negative for dysuria, flank pain, frequency, hematuria and urgency.  Musculoskeletal:  Negative for back pain, joint pain and myalgias.  Skin:  Negative for rash.  Neurological:  Negative for dizziness, tingling, focal weakness, seizures, weakness and headaches.  Endo/Heme/Allergies:  Does not bruise/bleed easily.  Psychiatric/Behavioral:  Negative for depression and suicidal ideas. The patient does not have insomnia.       No Known Allergies   Past Medical History:  Diagnosis Date   Arthritis    hands and feet   Cancer associated pain    Cataract    right   Complication of anesthesia    sometimes have a hard time waking up   Diabetes mellitus    Borderline Diabetes   GERD (gastroesophageal reflux disease)    HTN (hypertension)    Hyperlipidemia    Lung cancer (HCC)    Motion sickness    Post corneal transplant    right     Past Surgical History:  Procedure Laterality Date  APPLICATION OF INTRAOPERATIVE CT SCAN  02/15/2022   Procedure: APPLICATION OF INTRAOPERATIVE CT SCAN;  Surgeon: Venetia Night, MD;  Location: [facility] ORS;  Service: Neurosurgery;;   BASAL CELL CARCINOMA EXCISION     Dr. Lorn Junes    CARPAL TUNNEL RELEASE     right   COLONOSCOPY WITH PROPOFOL N/A 12/03/2015   Procedure: COLONOSCOPY WITH PROPOFOL;  Surgeon: Scot Jun, MD;  Location: [facility] ENDOSCOPY;  Service: Endoscopy;  Laterality: N/A;   COLONOSCOPY WITH PROPOFOL N/A 02/22/2020   Procedure: COLONOSCOPY WITH PROPOFOL;  Surgeon: Regis Bill, MD;  Location: [facility] ENDOSCOPY;  Service: Endoscopy;  Laterality: N/A;   ESOPHAGOGASTRODUODENOSCOPY (EGD) WITH PROPOFOL N/A 02/22/2020   Procedure: ESOPHAGOGASTRODUODENOSCOPY (EGD) WITH PROPOFOL;  Surgeon: Regis Bill, MD;  Location: [facility] ENDOSCOPY;  Service: Endoscopy;  Laterality: N/A;   ESOPHAGOGASTRODUODENOSCOPY (EGD) WITH PROPOFOL N/A 06/07/2022   Procedure: ESOPHAGOGASTRODUODENOSCOPY (EGD) WITH PROPOFOL;  Surgeon: Regis Bill, MD;  Location: [facility] ENDOSCOPY;  Service: Endoscopy;  Laterality: N/A;   HAMMER TOE SURGERY Right 05/23/2019   Procedure: HAMMER TOE CORRECTION;  Surgeon: Gwyneth Revels, DPM;  Location: [facility];  Service: Podiatry;  Laterality: Right;  Diabetic   IR BONE TUMOR(S)RF ABLATION  04/30/2022   IR IMAGING GUIDED PORT INSERTION  04/14/2022   IR KYPHO LUMBAR INC FX REDUCE BONE BX UNI/BIL CANNULATION INC/IMAGING  04/30/2022   IR RADIOLOGIST EVAL & MGMT  03/24/2022   IR RADIOLOGIST EVAL & MGMT  06/23/2022   OSTECTOMY Right 05/23/2019   Procedure: DOUBLE OSTEOTOMY RIGHT;  Surgeon: Gwyneth Revels, DPM;  Location: [facility];  Service: Podiatry;  Laterality: Right;   SKIN CANCER EXCISION      Social History   Socioeconomic History   Marital status: Widowed    Spouse name: Not on file   Number of children: Not on file   Years of education: Not on file   Highest education level: Not on file  Occupational History    Employer: [employer]    Comment: Currently not employed  Tobacco Use   Smoking status: Former    Packs/day: 2.00    Years: 20.00    Additional pack years: 0.00    Total pack years: 40.00    Types: Cigarettes     Quit date: 01/13/2009    Years since quitting: 13.7   Smokeless tobacco: Never  Vaping Use   Vaping Use: Never used  Substance and Sexual Activity   Alcohol use: No   Drug use: No   Sexual activity: Not Currently    Birth control/protection: Post-menopausal  Other Topics Concern   Not on file  Social History Narrative   Not on file   Social Determinants of Health   Financial Resource Strain: Not on file  Food Insecurity: No Food Insecurity (08/18/2022)   Hunger Vital Sign    Worried About Running Out of Food in the Last Year: Never true    Ran Out of Food in the Last Year: Never true  Transportation Needs: No Transportation Needs (08/18/2022)   PRAPARE - Administrator, Civil Service (Medical): No    Lack of Transportation (Non-Medical): No  Physical Activity: Not on file  Stress: Not on file  Social Connections: Not on file  Intimate Partner Violence: Not At Risk (08/18/2022)   Humiliation, Afraid, Rape, and Kick questionnaire    Fear of Current or Ex-Partner: No    Emotionally Abused: No    Physically Abused: No    Sexually Abused: No  Family History  Problem Relation Age of Onset   Heart disease Mother    Heart disease Father    Heart disease Maternal Grandmother    Heart disease Maternal Grandfather    Breast cancer Sister 67     Current Outpatient Medications:    acetaminophen (TYLENOL) 500 MG tablet, Take 500 mg by mouth every 6 (six) hours as needed for moderate pain, mild pain, fever or headache., Disp: , Rfl:    albuterol (PROVENTIL) (2.5 MG/3ML) 0.083% nebulizer solution, Take 3 mLs (2.5 mg total) by nebulization in the morning and at bedtime., Disp: 169 mL, Rfl: 12   Ascorbic Acid (VITAMIN C) 1000 MG tablet, Take 1,000 mg by mouth daily.  , Disp: , Rfl:    carvedilol (COREG) 3.125 MG tablet, Take 3.125 mg by mouth 2 (two) times daily with a meal., Disp: , Rfl:    cyclobenzaprine (FLEXERIL) 5 MG tablet, Take 1 tablet (5 mg total) by mouth 3  (three) times daily as needed for muscle spasms., Disp: 30 tablet, Rfl: 0   dexamethasone (DECADRON) 4 MG tablet, TAKE 1 TABLET 2 TIMES DAILY STARTING DAY BEFORE PEMETREXED. THEN TAKE 2 TABS DAILY FOR 3 DAYS STARTING DAY AFTER CARBOPLATIN. TAKE WITH FOOD., Disp: 30 tablet, Rfl: 1   Docusate Calcium (STOOL SOFTENER PO), Take 1 capsule by mouth daily as needed (constipation)., Disp: , Rfl:    ELIQUIS 5 MG TABS tablet, TAKE 1 TABLET BY MOUTH TWICE A DAY, Disp: 60 tablet, Rfl: 2   folic acid (FOLVITE) 1 MG tablet, Take 1 tablet (1 mg total) by mouth daily. Start 7 days before pemetrexed chemotherapy. Continue until 21 days after pemetrexed completed., Disp: 100 tablet, Rfl: 3   gabapentin (NEURONTIN) 100 MG capsule, Take 100 mg by mouth 2 (two) times daily., Disp: , Rfl:    lidocaine-prilocaine (EMLA) cream, Apply 1 Application topically as needed., Disp: 30 g, Rfl: 2   LORazepam (ATIVAN) 0.5 MG tablet, Take 1 tablet (0.5 mg total) by mouth every 8 (eight) hours as needed for anxiety., Disp: 30 tablet, Rfl: 0   losartan (COZAAR) 100 MG tablet, Take 100 mg by mouth daily., Disp: , Rfl:    metFORMIN (GLUCOPHAGE) 500 MG tablet, Take 1 tablet (500 mg total) by mouth 2 (two) times daily with a meal., Disp: , Rfl:    Multiple Vitamin (MULTIVITAMIN) capsule, Take 1 capsule by mouth daily.  , Disp: , Rfl:    omeprazole (PRILOSEC) 20 MG capsule, Take 1 capsule (20 mg total) by mouth daily., Disp: 90 capsule, Rfl: 3   polyethylene glycol (MIRALAX / GLYCOLAX) 17 g packet, Take 17 g by mouth daily., Disp: 14 each, Rfl: 0   prednisoLONE acetate (PRED MILD) 0.12 % ophthalmic suspension, Place 1 drop into both eyes daily., Disp: , Rfl:    simvastatin (ZOCOR) 40 MG tablet, TAKE 1 TABLET EVERY DAY  AT  6:00 PM, Disp: 90 tablet, Rfl: 3   sodium chloride HYPERTONIC 3 % nebulizer solution, Take by nebulization in the morning and at bedtime., Disp: 750 mL, Rfl: 12   OLANZapine (ZYPREXA) 10 MG tablet, TAKE 1 TABLET BY MOUTH  EVERYDAY AT BEDTIME (Patient taking differently: Take 10 mg by mouth at bedtime.), Disp: 90 tablet, Rfl: 1   ondansetron (ZOFRAN) 8 MG tablet, Take 1 tablet (8 mg total) by mouth every 8 (eight) hours as needed for nausea or vomiting. Start on the third day after carboplatin., Disp: 30 tablet, Rfl: 1   oxyCODONE ER (XTAMPZA ER) 18  MG C12A, Take 1 capsule by mouth every 12 (twelve) hours., Disp: 60 capsule, Rfl: 0   Oxycodone HCl 20 MG TABS, Take 0.5-1 tablets (10-20 mg total) by mouth every 4 (four) hours as needed., Disp: 60 tablet, Rfl: 0   prochlorperazine (COMPAZINE) 10 MG tablet, Take 1 tablet (10 mg total) by mouth every 6 (six) hours as needed for nausea or vomiting., Disp: 30 tablet, Rfl: 2 No current facility-administered medications for this visit.  Facility-Administered Medications Ordered in Other Visits:    0.9 %  sodium chloride infusion, , Intravenous, Continuous, Borders, Daryl Eastern, NP, Stopped at 08/09/22 1434   heparin lock flush 100 unit/mL, 500 Units, Intravenous, Once, Creig Hines, MD   sodium chloride flush (NS) 0.9 % injection 10 mL, 10 mL, Intracatheter, PRN, Creig Hines, MD, 10 mL at 09/17/22 1112  Physical exam:  Vitals:   10/08/22 0913  BP: 124/69  Pulse: 98  Temp: (!) 96.6 F (35.9 C)  TempSrc: Tympanic  SpO2: 99%  Weight: 108 lb 9.6 oz (49.3 kg)   Physical Exam Vitals reviewed.  Constitutional:      Appearance: She is not ill-appearing.  Cardiovascular:     Rate and Rhythm: Normal rate and regular rhythm.  Pulmonary:     Effort: Pulmonary effort is normal. No respiratory distress.  Abdominal:     General: There is no distension.     Palpations: Abdomen is soft.     Tenderness: There is no abdominal tenderness.  Skin:    General: Skin is warm and dry.  Neurological:     Mental Status: She is alert and oriented to person, place, and time.  Psychiatric:        Mood and Affect: Mood normal.        Behavior: Behavior normal.       Latest Ref  Rng & Units 10/08/2022    9:02 AM  CMP  Glucose 70 - 99 mg/dL 981   BUN 8 - 23 mg/dL 23   Creatinine 1.91 - 1.00 mg/dL 4.78   Sodium 295 - 621 mmol/L 138   Potassium 3.5 - 5.1 mmol/L 4.0   Chloride 98 - 111 mmol/L 105   CO2 22 - 32 mmol/L 22   Calcium 8.9 - 10.3 mg/dL 8.6   Total Protein 6.5 - 8.1 g/dL 6.8   Total Bilirubin 0.3 - 1.2 mg/dL 0.1   Alkaline Phos 38 - 126 U/L 87   AST 15 - 41 U/L 29   ALT 0 - 44 U/L 19       Latest Ref Rng & Units 10/08/2022    9:02 AM  CBC  WBC 4.0 - 10.5 K/uL 6.7   Hemoglobin 12.0 - 15.0 g/dL 8.5   Hematocrit 30.8 - 46.0 % 25.6   Platelets 150 - 400 K/uL 284     No images are attached to the encounter.  CT CHEST ABDOMEN PELVIS W CONTRAST  Result Date: 10/07/2022 CLINICAL DATA:  Metastatic lung cancer restaging * Tracking Code: BO * EXAM: CT CHEST, ABDOMEN, AND PELVIS WITH CONTRAST TECHNIQUE: Multidetector CT imaging of the chest, abdomen and pelvis was performed following the standard protocol during bolus administration of intravenous contrast. RADIATION DOSE REDUCTION: This exam was performed according to the departmental dose-optimization program which includes automated exposure control, adjustment of the mA and/or kV according to patient size and/or use of iterative reconstruction technique. CONTRAST:  75mL OMNIPAQUE IOHEXOL 300 MG/ML SOLN additional oral enteric contrast COMPARISON:  CT abdomen pelvis,  08/17/2022, CT chest abdomen pelvis, 07/02/2022 FINDINGS: CT CHEST FINDINGS Cardiovascular: Right chest port catheter. Aortic atherosclerosis. Normal heart size. Left and right coronary artery calcifications. No pericardial effusion. Mediastinum/Nodes: Unchanged treated anterior mediastinal/prevascular soft tissue (series 3, image 17). No persistently enlarged mediastinal, hilar, or axillary lymph nodes. Thyroid gland, trachea, and esophagus demonstrate no significant findings. Lungs/Pleura: Mild centrilobular emphysema. Diffuse bilateral bronchial  wall thickening. Unchanged post treatment appearance of the left lower lobe with dense fibrotic consolidation and volume loss (series 4, image 68). Slightly improved clustered centrilobular and tree-in-bud nodularity, particularly in the right lung (series 4, image 32, 96). Unchanged 0.6 cm nodule of the lingula (series 4, image 70). No pleural effusion or pneumothorax. Musculoskeletal: No chest wall abnormality. No acute osseous findings. Unchanged, subtly sclerotic treated osseous metastasis of the posterior right ninth rib (series 3, image 34). CT ABDOMEN PELVIS FINDINGS Hepatobiliary: No solid liver abnormality is seen. Simple cyst of the inferior right lobe of the liver, for which no further follow-up or characterization is required. No gallstones, gallbladder wall thickening, or biliary dilatation. Pancreas: Unremarkable. No pancreatic ductal dilatation or surrounding inflammatory changes. Spleen: Normal in size without significant abnormality. Adrenals/Urinary Tract: Adrenal glands are unremarkable. Kidneys are normal, without renal calculi, solid lesion, or hydronephrosis. Bladder is unremarkable. Stomach/Bowel: Stomach is within normal limits. Appendix appears normal. No evidence of bowel wall thickening, distention, or inflammatory changes. Large burden of stool throughout the colon. Descending and sigmoid diverticulosis. Vascular/Lymphatic: Aortic atherosclerosis. No enlarged abdominal or pelvic lymph nodes. Reproductive: No mass or other abnormality. Other: No abdominal wall hernia or abnormality. No ascites. Musculoskeletal: No acute osseous findings. Unchanged bridging fusion of T11 through L2 about a sclerotic fracture of L1 status post vertebral cement augmentation (series 6, image 79). IMPRESSION: 1. Unchanged post treatment/post radiation appearance of the left lower lobe with dense fibrotic consolidation and volume loss. 2. Unchanged treated anterior mediastinal/prevascular soft tissue. Subcarinal  lymph nodes remain resolved. No persistently enlarged nodes. 3. Unchanged, subtly sclerotic treated osseous metastasis of the posterior right ninth rib. 4. No evidence of new metastatic disease in the chest, abdomen, or pelvis. 5. Slightly improved clustered centrilobular and tree-in-bud nodularity, particularly in the right lung, consistent with improved atypical mycobacterial infection. Morphology of nodules present on today's examination is generally most consistent with infection or inflammation, although there may be underlying treated pulmonary metastatic disease given course of change over time. 6. Unchanged 0.6 cm nodule of the lingula, nonspecific. Attention on follow-up. 7. Emphysema and diffuse bilateral bronchial wall thickening. 8. Coronary artery disease. Aortic Atherosclerosis (ICD10-I70.0) and Emphysema (ICD10-J43.9). Electronically Signed   By: Jearld Lesch M.D.   On: 10/07/2022 07:12   DG Chest 2 View  Result Date: 09/23/2022 CLINICAL DATA:  Shortness of breath.  Lung cancer EXAM: CHEST - 2 VIEW COMPARISON:  08/17/2022 FINDINGS: Low volume chest with streaky density at the left more than right base. No effusion or pneumothorax. Normal heart size. Porta catheter with tip at the upper cavoatrial junction. IMPRESSION: Unchanged low volume chest with scarring/atelectasis on the left more than right. No acute finding compared to 08/17/2022. Electronically Signed   By: Tiburcio Pea M.D.   On: 09/23/2022 13:56     Assessment and plan- Patient is a 72 y.o. female with   Metastatic lung cancer with bone metastases- here for treatment assessment prior to cycle 5 of maintenance alimta and [place]. Labs today reviewed and acceptable for treatment. Tolerating well. Continues to recover from chemotherapy. Symptomatically stable. We reviewed  restaging CT C/A/P from June 25th, 2024 which reveals improved to stable disease. Plan to continue treatment. She will follow up with Dr. Smith Robert in 3 weeks for  consideration of cycle 6 of alimta keytruda. She will receive b12 every [redacted] weeks along with treatment. Plan for b12 today.  Bone metastases- declined zometa.  Neoplasm related pain- stable. Continue xtampza ER for long acting pain control with oxycodone for breakthrough pain. Continue bowel regimen to avoid constipation. Anemia- likely related to chemotherapy but will check ferritin and iron studies today which are normal. No role for IV iron currently.  Port-a-cath: functioning appropriately  Disposition Alimta-keytruda today B12 today 3 weeks- port/lab (cbc, cmp), Dr Smith Robert, +/- alimta-keytruda- la   Visit Diagnosis 1. Primary malignant neoplasm of right lung metastatic to other site [facility])   2. Encounter for antineoplastic immunotherapy    Consuello Masse, DNP, AGNP-C, [facility] at [facility] [phone] (clinic) 10/08/2022

## 2022-10-08 NOTE — Progress Notes (Addendum)
Calculated dose outside of 10%. 540 max dose would be 594. Spoke w/ NP Lauren and she would like to give lower dose due to patient experiencing anemia.  New dose based on updated weight would be 500 mg (375  mg/m2 x 1.44 mg/m2)  = ~ 500 mg  Amber Glenn, PharmD

## 2022-10-08 NOTE — Progress Notes (Signed)
Nutrition Follow-up:  Patient with lung cancer with bone mets.  Currently on maintenance alimta and [place].  Noted hospitalization in May with diverticulitis, UTI, pneumonia.    Met with patient and sister during infusion.  Patient reports that she is trying to eat as much as she can.  Has issues with nausea. States that sometimes the nausea medication helps and sometimes it doesn't.  Having an issues with constipation as well.  Planning to start senna.  Yesterday able to eat cereal, fruit, lasagna.  Drinking 1-2 boost plus shakes a day (360 calorie).    Notes from SLP reviewed, continue nectar thick liquids.      Medications: Inadequate oral intake ongoing  Labs: glucose 106, creatinine 1.55  Anthropometrics:   Weight 108 lb 9.6 oz   117 lb on 4/19 116 lb 8 oz on 3/8 118 lb 8 oz on 2/16 111 lb 12.8 oz on 12/14 130 lb on 02/10/22   NUTRITION DIAGNOSIS: Inadequate oral intake ongoing   INTERVENTION:  Continue high calorie oral nutrition supplement Continue high calorie and high protein foods  Work on bowel regimen to promote regular bowel movement Continue antinausea medication.     MONITORING, EVALUATION, GOAL: weight trends, intake   NEXT VISIT: as needed  Margart Zemanek B. Freida Busman, RD, LDN Registered Dietitian [phone]

## 2022-10-11 LAB — T4: T4, Total: 7.3 ug/dL (ref 4.5–12.0)

## 2022-10-17 ENCOUNTER — Encounter: Payer: Self-pay | Admitting: Oncology

## 2022-10-21 ENCOUNTER — Other Ambulatory Visit: Payer: Self-pay | Admitting: *Deleted

## 2022-10-21 ENCOUNTER — Telehealth: Payer: Self-pay | Admitting: *Deleted

## 2022-10-21 ENCOUNTER — Ambulatory Visit: Payer: Medicare HMO | Admitting: Student in an Organized Health Care Education/Training Program

## 2022-10-21 DIAGNOSIS — D509 Iron deficiency anemia, unspecified: Secondary | ICD-10-CM

## 2022-10-21 DIAGNOSIS — C3491 Malignant neoplasm of unspecified part of right bronchus or lung: Secondary | ICD-10-CM

## 2022-10-21 NOTE — Telephone Encounter (Signed)
Chemo is likely from her anemia. I will see her next visit and consider dropping alimta all together and just give keytruda alone and see if anemia improves. Hb is not low enough for blood transfusion but we can bring her tomorrow for cbc and hold tube. If hb is <8, we can give her blood  Josh- can you see her tomorrow with labs?

## 2022-10-21 NOTE — Telephone Encounter (Addendum)
Received message from pt's daughter stating that pt continues to feel fatigued and lethargic. States that hemoglobin was low at last visit and is asking for recommendations regarding low hemoglobin.   Please advise.

## 2022-10-21 NOTE — Telephone Encounter (Signed)
Pt made aware of recommendations and informed that will be called with appt for tomorrow.

## 2022-10-22 ENCOUNTER — Other Ambulatory Visit: Payer: Self-pay | Admitting: Oncology

## 2022-10-22 ENCOUNTER — Other Ambulatory Visit: Payer: Self-pay | Admitting: *Deleted

## 2022-10-22 ENCOUNTER — Inpatient Hospital Stay: Payer: Medicare HMO | Attending: Oncology

## 2022-10-22 ENCOUNTER — Inpatient Hospital Stay (HOSPITAL_BASED_OUTPATIENT_CLINIC_OR_DEPARTMENT_OTHER): Payer: Medicare HMO | Admitting: Hospice and Palliative Medicine

## 2022-10-22 ENCOUNTER — Encounter: Payer: Self-pay | Admitting: Hospice and Palliative Medicine

## 2022-10-22 VITALS — BP 121/54 | HR 89 | Temp 98.1°F | Resp 18

## 2022-10-22 DIAGNOSIS — C3490 Malignant neoplasm of unspecified part of unspecified bronchus or lung: Secondary | ICD-10-CM | POA: Diagnosis not present

## 2022-10-22 DIAGNOSIS — C3491 Malignant neoplasm of unspecified part of right bronchus or lung: Secondary | ICD-10-CM

## 2022-10-22 DIAGNOSIS — D649 Anemia, unspecified: Secondary | ICD-10-CM

## 2022-10-22 DIAGNOSIS — R110 Nausea: Secondary | ICD-10-CM | POA: Insufficient documentation

## 2022-10-22 DIAGNOSIS — C7951 Secondary malignant neoplasm of bone: Secondary | ICD-10-CM | POA: Diagnosis not present

## 2022-10-22 DIAGNOSIS — D61818 Other pancytopenia: Secondary | ICD-10-CM | POA: Insufficient documentation

## 2022-10-22 DIAGNOSIS — Z803 Family history of malignant neoplasm of breast: Secondary | ICD-10-CM | POA: Diagnosis not present

## 2022-10-22 DIAGNOSIS — Z5112 Encounter for antineoplastic immunotherapy: Secondary | ICD-10-CM | POA: Insufficient documentation

## 2022-10-22 DIAGNOSIS — C770 Secondary and unspecified malignant neoplasm of lymph nodes of head, face and neck: Secondary | ICD-10-CM | POA: Insufficient documentation

## 2022-10-22 DIAGNOSIS — D6481 Anemia due to antineoplastic chemotherapy: Secondary | ICD-10-CM | POA: Insufficient documentation

## 2022-10-22 DIAGNOSIS — Z87891 Personal history of nicotine dependence: Secondary | ICD-10-CM | POA: Diagnosis not present

## 2022-10-22 DIAGNOSIS — T451X5A Adverse effect of antineoplastic and immunosuppressive drugs, initial encounter: Secondary | ICD-10-CM | POA: Diagnosis not present

## 2022-10-22 DIAGNOSIS — D509 Iron deficiency anemia, unspecified: Secondary | ICD-10-CM

## 2022-10-22 LAB — CBC WITH DIFFERENTIAL (CANCER CENTER ONLY)
Abs Immature Granulocytes: 0.05 10*3/uL (ref 0.00–0.07)
Basophils Absolute: 0 10*3/uL (ref 0.0–0.1)
Basophils Relative: 0 %
Eosinophils Absolute: 0 10*3/uL (ref 0.0–0.5)
Eosinophils Relative: 0 %
HCT: 21.5 % — ABNORMAL LOW (ref 36.0–46.0)
Hemoglobin: 6.9 g/dL — CL (ref 12.0–15.0)
Immature Granulocytes: 1 %
Lymphocytes Relative: 38 %
Lymphs Abs: 1.3 10*3/uL (ref 0.7–4.0)
MCH: 31.8 pg (ref 26.0–34.0)
MCHC: 32.1 g/dL (ref 30.0–36.0)
MCV: 99.1 fL (ref 80.0–100.0)
Monocytes Absolute: 0.5 10*3/uL (ref 0.1–1.0)
Monocytes Relative: 13 %
Neutro Abs: 1.7 10*3/uL (ref 1.7–7.7)
Neutrophils Relative %: 48 %
Platelet Count: 64 10*3/uL — ABNORMAL LOW (ref 150–400)
RBC: 2.17 MIL/uL — ABNORMAL LOW (ref 3.87–5.11)
RDW: 15.9 % — ABNORMAL HIGH (ref 11.5–15.5)
WBC Count: 3.5 10*3/uL — ABNORMAL LOW (ref 4.0–10.5)
nRBC: 0 % (ref 0.0–0.2)

## 2022-10-22 LAB — SAMPLE TO BLOOD BANK

## 2022-10-22 LAB — PREPARE RBC (CROSSMATCH)

## 2022-10-22 MED ORDER — SODIUM CHLORIDE 0.9% FLUSH
10.0000 mL | Freq: Once | INTRAVENOUS | Status: AC
  Administered 2022-10-22: 10 mL via INTRAVENOUS
  Filled 2022-10-22: qty 10

## 2022-10-22 MED ORDER — HEPARIN SOD (PORK) LOCK FLUSH 100 UNIT/ML IV SOLN
500.0000 [IU] | Freq: Once | INTRAVENOUS | Status: DC
  Filled 2022-10-22: qty 5

## 2022-10-22 NOTE — Progress Notes (Signed)
Patient reports She's feeling weak tired. Nausea when she tries to eat but she takes a nausea pill and it helps. appetite is low. she is drinking boost. had a little constipation but says stool softeners has helped

## 2022-10-22 NOTE — Progress Notes (Signed)
Symptom Management Clinic [facility] at [facility] [phone] [phone]  Patient Care Team: Dortha Kern, MD as PCP - General (Family Medicine) Glory Buff, RN as Oncology Nurse Navigator   NAME OF PATIENT: Amber Glenn  [id number]  1951-03-17   DATE OF VISIT: 10/22/22  REASON FOR CONSULT: Amber Glenn is a 72 y.o. female with multiple medical problems including stage IV lung cancer with bone metastases and pathologic compression fracture of L1 status post percutaneous fixation by neurosurgery followed by RFA due to intractable pain.   Patient is on systemic chemo/immunotherapy.  INTERVAL HISTORY: Patient saw Consuello Masse, NP on 09/23/2022 for increased productive cough.  Patient had negative chest x-ray and symptoms were monitored conservatively.  Patient had CT chest abdomen pelvis on 6/25 which showed unchanged posttreatment effects but no evidence of new metastatic disease in the chest abdomen or pelvis.  Patient was found to have slightly improved tree-in-bud nodularity in the right lung consistent with improved atypical infection.  Patient received cycle 1 on Alimta Keytruda on 6/28.  Patient presents [facility] today for evaluation of fatigue.  Patient says that she has had persistent fatigue since her last chemotherapy.  She has occasional nausea and vomiting but none today.  Appetite is fair.  No fever or chills.  Patient reports unchanged cough.  Denies shortness of breath or chest pain currently.  She endorses constipation but denies black or tarry stools.  No hematuria.  Patient offers no further specific complaints today.   PAST MEDICAL HISTORY: Past Medical History:  Diagnosis Date   Arthritis    hands and feet   Cancer associated pain    Cataract    right   Complication of anesthesia    sometimes have a hard time waking up   Diabetes mellitus    Borderline Diabetes   GERD (gastroesophageal reflux disease)     HTN (hypertension)    Hyperlipidemia    Lung cancer (HCC)    Motion sickness    Post corneal transplant    right    PAST SURGICAL HISTORY:  Past Surgical History:  Procedure Laterality Date   APPLICATION OF INTRAOPERATIVE CT SCAN  02/15/2022   Procedure: APPLICATION OF INTRAOPERATIVE CT SCAN;  Surgeon: Venetia Night, MD;  Location: [facility] ORS;  Service: Neurosurgery;;   BASAL CELL CARCINOMA EXCISION     Dr. Lorn Junes   CARPAL TUNNEL RELEASE     right   COLONOSCOPY WITH PROPOFOL N/A 12/03/2015   Procedure: COLONOSCOPY WITH PROPOFOL;  Surgeon: Scot Jun, MD;  Location: [facility] ENDOSCOPY;  Service: Endoscopy;  Laterality: N/A;   COLONOSCOPY WITH PROPOFOL N/A 02/22/2020   Procedure: COLONOSCOPY WITH PROPOFOL;  Surgeon: Regis Bill, MD;  Location: [facility] ENDOSCOPY;  Service: Endoscopy;  Laterality: N/A;   ESOPHAGOGASTRODUODENOSCOPY (EGD) WITH PROPOFOL N/A 02/22/2020   Procedure: ESOPHAGOGASTRODUODENOSCOPY (EGD) WITH PROPOFOL;  Surgeon: Regis Bill, MD;  Location: [facility] ENDOSCOPY;  Service: Endoscopy;  Laterality: N/A;   ESOPHAGOGASTRODUODENOSCOPY (EGD) WITH PROPOFOL N/A 06/07/2022   Procedure: ESOPHAGOGASTRODUODENOSCOPY (EGD) WITH PROPOFOL;  Surgeon: Regis Bill, MD;  Location: [facility] ENDOSCOPY;  Service: Endoscopy;  Laterality: N/A;   HAMMER TOE SURGERY Right 05/23/2019   Procedure: HAMMER TOE CORRECTION;  Surgeon: Gwyneth Revels, DPM;  Location: [facility];  Service: Podiatry;  Laterality: Right;  Diabetic   IR BONE TUMOR(S)RF ABLATION  04/30/2022   IR IMAGING GUIDED PORT INSERTION  04/14/2022   IR KYPHO LUMBAR INC FX REDUCE BONE BX UNI/BIL CANNULATION INC/IMAGING  04/30/2022   IR RADIOLOGIST EVAL & MGMT  03/24/2022   IR RADIOLOGIST EVAL & MGMT  06/23/2022   OSTECTOMY Right 05/23/2019   Procedure: DOUBLE OSTEOTOMY RIGHT;  Surgeon: Gwyneth Revels, DPM;  Location: [facility];  Service: Podiatry;  Laterality: Right;   SKIN CANCER EXCISION       HEMATOLOGY/ONCOLOGY HISTORY:  Oncology History  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 site [facility])  03/03/2022 Initial Diagnosis   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 site [facility])   03/03/2022 Cancer Staging   Staging form: Lung, AJCC 8th Edition - Clinical stage from 03/03/2022: Stage IV (cT2, cN3, pM1) - Signed by Creig Hines, MD on 03/03/2022   04/16/2022 -  Chemotherapy   Patient is on Treatment Plan : LUNG Carboplatin (5) + Pemetrexed (500) + Pembrolizumab (200) D1 q21d Induction x 4 cycles / Maintenance Pemetrexed (500) + Pembrolizumab (200) D1 q21d       ALLERGIES:  has No Known Allergies.  MEDICATIONS:  Current Outpatient Medications  Medication Sig Dispense Refill   acetaminophen (TYLENOL) 500 MG tablet Take 500 mg by mouth every 6 (six) hours as needed for moderate pain, mild pain, fever or headache.     albuterol (PROVENTIL) (2.5 MG/3ML) 0.083% nebulizer solution Take 3 mLs (2.5 mg total) by nebulization in the morning and at bedtime. 169 mL 12   Ascorbic Acid (VITAMIN C) 1000 MG tablet Take 1,000 mg by mouth daily.       carvedilol (COREG) 3.125 MG tablet Take 3.125 mg by mouth 2 (two) times daily with a meal.     cyclobenzaprine (FLEXERIL) 5 MG tablet Take 1 tablet (5 mg total) by mouth 3 (three) times daily as needed for muscle spasms. 30 tablet 0   dexamethasone (DECADRON) 4 MG tablet TAKE 1 TABLET 2 TIMES DAILY STARTING DAY BEFORE PEMETREXED. THEN TAKE 2 TABS DAILY FOR 3 DAYS STARTING DAY AFTER CARBOPLATIN. TAKE WITH FOOD. 30 tablet 1   Docusate Calcium (STOOL SOFTENER PO) Take 1 capsule by mouth daily as needed (constipation).     ELIQUIS 5 MG TABS tablet TAKE 1 TABLET BY MOUTH TWICE A DAY 60 tablet 2   folic acid (FOLVITE) 1 MG tablet Take 1 tablet (1 mg total) by mouth daily. Start 7 days before pemetrexed chemotherapy. Continue until 21 days after pemetrexed completed. 100 tablet 3   gabapentin (NEURONTIN) 100 MG capsule Take 100  mg by mouth 2 (two) times daily.     lidocaine-prilocaine (EMLA) cream Apply 1 Application topically as needed. 30 g 2   LORazepam (ATIVAN) 0.5 MG tablet Take 1 tablet (0.5 mg total) by mouth every 8 (eight) hours as needed for anxiety. 30 tablet 0   losartan (COZAAR) 100 MG tablet Take 100 mg by mouth daily.     metFORMIN (GLUCOPHAGE) 500 MG tablet Take 1 tablet (500 mg total) by mouth 2 (two) times daily with a meal.     Multiple Vitamin (MULTIVITAMIN) capsule Take 1 capsule by mouth daily.       omeprazole (PRILOSEC) 20 MG capsule Take 1 capsule (20 mg total) by mouth daily. 90 capsule 3   ondansetron (ZOFRAN) 8 MG tablet Take 1 tablet (8 mg total) by mouth every 8 (eight) hours as needed for nausea or vomiting. Start on the third day after carboplatin. 30 tablet 1   Oxycodone HCl 20 MG TABS Take 0.5-1 tablets (10-20 mg total) by mouth every 4 (four) hours as needed. 60 tablet 0  polyethylene glycol (MIRALAX / GLYCOLAX) 17 g packet Take 17 g by mouth daily. 14 each 0   prednisoLONE acetate (PRED MILD) 0.12 % ophthalmic suspension Place 1 drop into both eyes daily.     prochlorperazine (COMPAZINE) 10 MG tablet Take 1 tablet (10 mg total) by mouth every 6 (six) hours as needed for nausea or vomiting. 30 tablet 2   simvastatin (ZOCOR) 40 MG tablet TAKE 1 TABLET EVERY DAY  AT  6:00 PM 90 tablet 3   sodium chloride HYPERTONIC 3 % nebulizer solution Take by nebulization in the morning and at bedtime. 750 mL 12   No current facility-administered medications for this visit.   Facility-Administered Medications Ordered in Other Visits  Medication Dose Route Frequency Provider Last Rate Last Admin   0.9 %  sodium chloride infusion   Intravenous Continuous Nery Kalisz, Daryl Eastern, NP   Stopped at 08/09/22 1434   heparin lock flush 100 unit/mL  500 Units Intravenous Once Creig Hines, MD       sodium chloride flush (NS) 0.9 % injection 10 mL  10 mL Intracatheter PRN Creig Hines, MD   10 mL at 09/17/22  1112    VITAL SIGNS: BP (!) 121/54 (BP Location: Left Arm, Patient Position: Sitting)   Pulse 89   Temp 98.1 F (36.7 C) (Oral)   Resp 18   SpO2 100%  There were no vitals filed for this visit.  Estimated body mass index is 21.21 kg/m as calculated from the following:   Height as of 09/23/22: 5' (1.524 m).   Weight as of 10/08/22: 108 lb 9.6 oz (49.3 kg).  LABS: CBC:    Component Value Date/Time   WBC 3.5 (L) 10/22/2022 1256   WBC 6.7 10/08/2022 0902   HGB 6.9 (LL) 10/22/2022 1256   HCT 21.5 (L) 10/22/2022 1256   PLT 64 (L) 10/22/2022 1256   MCV 99.1 10/22/2022 1256   NEUTROABS 1.7 10/22/2022 1256   LYMPHSABS 1.3 10/22/2022 1256   MONOABS 0.5 10/22/2022 1256   EOSABS 0.0 10/22/2022 1256   BASOSABS 0.0 10/22/2022 1256   Comprehensive Metabolic Panel:    Component Value Date/Time   NA 138 10/08/2022 0902   K 4.0 10/08/2022 0902   CL 105 10/08/2022 0902   CO2 22 10/08/2022 0902   BUN 23 10/08/2022 0902   CREATININE 1.55 (H) 10/08/2022 0902   CREATININE 1.09 (H) 09/23/2022 1334   GLUCOSE 106 (H) 10/08/2022 0902   CALCIUM 8.6 (L) 10/08/2022 0902   AST 29 10/08/2022 0902   AST 27 09/23/2022 1334   ALT 19 10/08/2022 0902   ALT 23 09/23/2022 1334   ALKPHOS 87 10/08/2022 0902   BILITOT 0.1 (L) 10/08/2022 0902   BILITOT 0.3 09/23/2022 1334   PROT 6.8 10/08/2022 0902   ALBUMIN 3.2 (L) 10/08/2022 0902    RADIOGRAPHIC STUDIES: CT CHEST ABDOMEN PELVIS W CONTRAST  Result Date: 10/07/2022 CLINICAL DATA:  Metastatic lung cancer restaging * Tracking Code: BO * EXAM: CT CHEST, ABDOMEN, AND PELVIS WITH CONTRAST TECHNIQUE: Multidetector CT imaging of the chest, abdomen and pelvis was performed following the standard protocol during bolus administration of intravenous contrast. RADIATION DOSE REDUCTION: This exam was performed according to the departmental dose-optimization program which includes automated exposure control, adjustment of the mA and/or kV according to patient size  and/or use of iterative reconstruction technique. CONTRAST:  75mL OMNIPAQUE IOHEXOL 300 MG/ML SOLN additional oral enteric contrast COMPARISON:  CT abdomen pelvis, 08/17/2022, CT chest abdomen pelvis, 07/02/2022 FINDINGS:  CT CHEST FINDINGS Cardiovascular: Right chest port catheter. Aortic atherosclerosis. Normal heart size. Left and right coronary artery calcifications. No pericardial effusion. Mediastinum/Nodes: Unchanged treated anterior mediastinal/prevascular soft tissue (series 3, image 17). No persistently enlarged mediastinal, hilar, or axillary lymph nodes. Thyroid gland, trachea, and esophagus demonstrate no significant findings. Lungs/Pleura: Mild centrilobular emphysema. Diffuse bilateral bronchial wall thickening. Unchanged post treatment appearance of the left lower lobe with dense fibrotic consolidation and volume loss (series 4, image 68). Slightly improved clustered centrilobular and tree-in-bud nodularity, particularly in the right lung (series 4, image 32, 96). Unchanged 0.6 cm nodule of the lingula (series 4, image 70). No pleural effusion or pneumothorax. Musculoskeletal: No chest wall abnormality. No acute osseous findings. Unchanged, subtly sclerotic treated osseous metastasis of the posterior right ninth rib (series 3, image 34). CT ABDOMEN PELVIS FINDINGS Hepatobiliary: No solid liver abnormality is seen. Simple cyst of the inferior right lobe of the liver, for which no further follow-up or characterization is required. No gallstones, gallbladder wall thickening, or biliary dilatation. Pancreas: Unremarkable. No pancreatic ductal dilatation or surrounding inflammatory changes. Spleen: Normal in size without significant abnormality. Adrenals/Urinary Tract: Adrenal glands are unremarkable. Kidneys are normal, without renal calculi, solid lesion, or hydronephrosis. Bladder is unremarkable. Stomach/Bowel: Stomach is within normal limits. Appendix appears normal. No evidence of bowel wall  thickening, distention, or inflammatory changes. Large burden of stool throughout the colon. Descending and sigmoid diverticulosis. Vascular/Lymphatic: Aortic atherosclerosis. No enlarged abdominal or pelvic lymph nodes. Reproductive: No mass or other abnormality. Other: No abdominal wall hernia or abnormality. No ascites. Musculoskeletal: No acute osseous findings. Unchanged bridging fusion of T11 through L2 about a sclerotic fracture of L1 status post vertebral cement augmentation (series 6, image 79). IMPRESSION: 1. Unchanged post treatment/post radiation appearance of the left lower lobe with dense fibrotic consolidation and volume loss. 2. Unchanged treated anterior mediastinal/prevascular soft tissue. Subcarinal lymph nodes remain resolved. No persistently enlarged nodes. 3. Unchanged, subtly sclerotic treated osseous metastasis of the posterior right ninth rib. 4. No evidence of new metastatic disease in the chest, abdomen, or pelvis. 5. Slightly improved clustered centrilobular and tree-in-bud nodularity, particularly in the right lung, consistent with improved atypical mycobacterial infection. Morphology of nodules present on today's examination is generally most consistent with infection or inflammation, although there may be underlying treated pulmonary metastatic disease given course of change over time. 6. Unchanged 0.6 cm nodule of the lingula, nonspecific. Attention on follow-up. 7. Emphysema and diffuse bilateral bronchial wall thickening. 8. Coronary artery disease. Aortic Atherosclerosis (ICD10-I70.0) and Emphysema (ICD10-J43.9). Electronically Signed   By: Jearld Lesch M.D.   On: 10/07/2022 07:12   DG Chest 2 View  Result Date: 09/23/2022 CLINICAL DATA:  Shortness of breath.  Lung cancer EXAM: CHEST - 2 VIEW COMPARISON:  08/17/2022 FINDINGS: Low volume chest with streaky density at the left more than right base. No effusion or pneumothorax. Normal heart size. Porta catheter with tip at the  upper cavoatrial junction. IMPRESSION: Unchanged low volume chest with scarring/atelectasis on the left more than right. No acute finding compared to 08/17/2022. Electronically Signed   By: Tiburcio Pea M.D.   On: 09/23/2022 13:56    PERFORMANCE STATUS (ECOG) : 2 - Symptomatic, <50% confined to bed  Review of Systems Unless otherwise noted, a complete review of systems is negative.  Physical Exam General: NAD Cardiovascular: regular rate and rhythm Pulmonary: clear ant fields Abdomen: soft, nontender, + bowel sounds GU: no suprapubic tenderness Extremities: no edema, no joint deformities Skin: no  rashes Neurological: Weakness but otherwise nonfocal  IMPRESSION/PLAN: Stage IV lung cancer -on treatment with maintenance Alimta Keytruda.  Stable CT on 10/05/2022.  Pancytopenia-patient with no signs of active bleeding or infection.  Likely secondary to chemotherapy and should be close to nadir.  Discussed with Dr. Smith Robert and will proceed with blood transfusion on Monday.  Patient instructed to present to the emergency department in the event of worsening symptoms over the weekend.  Discussed fall precautions.  RTC next week  Case and plan discussed with Dr. Smith Robert  Patient expressed understanding and was in agreement with this plan. She also understands that She can call clinic at any time with any questions, concerns, or complaints.   Thank you for allowing me to participate in the care of this very pleasant patient.   Time Total: 20 minutes  Visit consisted of counseling and education dealing with the complex and emotionally intense issues of symptom management in the setting of serious illness.Greater than 50%  of this time was spent counseling and coordinating care related to the above assessment and plan.  Signed by: Laurette Schimke, PhD, NP-C

## 2022-10-23 ENCOUNTER — Other Ambulatory Visit: Payer: Self-pay | Admitting: Oncology

## 2022-10-25 ENCOUNTER — Inpatient Hospital Stay: Payer: Medicare HMO

## 2022-10-25 DIAGNOSIS — Z5112 Encounter for antineoplastic immunotherapy: Secondary | ICD-10-CM | POA: Diagnosis not present

## 2022-10-25 DIAGNOSIS — D649 Anemia, unspecified: Secondary | ICD-10-CM

## 2022-10-25 LAB — TYPE AND SCREEN
Antibody Screen: NEGATIVE
Unit division: 0

## 2022-10-25 LAB — BPAM RBC: Blood Product Expiration Date: 202408092359

## 2022-10-25 MED ORDER — HEPARIN SOD (PORK) LOCK FLUSH 100 UNIT/ML IV SOLN
500.0000 [IU] | Freq: Every day | INTRAVENOUS | Status: AC | PRN
  Administered 2022-10-25: 500 [IU]
  Filled 2022-10-25: qty 5

## 2022-10-25 MED ORDER — ACETAMINOPHEN 325 MG PO TABS
650.0000 mg | ORAL_TABLET | Freq: Once | ORAL | Status: AC
  Administered 2022-10-25: 650 mg via ORAL
  Filled 2022-10-25: qty 2

## 2022-10-25 MED ORDER — SODIUM CHLORIDE 0.9% IV SOLUTION
250.0000 mL | Freq: Once | INTRAVENOUS | Status: AC
  Administered 2022-10-25: 250 mL via INTRAVENOUS
  Filled 2022-10-25: qty 250

## 2022-10-26 LAB — TYPE AND SCREEN: ABO/RH(D): O NEG

## 2022-10-26 LAB — BPAM RBC
ISSUE DATE / TIME: 202407150943
Unit Type and Rh: 5100

## 2022-10-28 MED FILL — Dexamethasone Sodium Phosphate Inj 100 MG/10ML: INTRAMUSCULAR | Qty: 1 | Status: AC

## 2022-10-29 ENCOUNTER — Inpatient Hospital Stay (HOSPITAL_BASED_OUTPATIENT_CLINIC_OR_DEPARTMENT_OTHER): Payer: Medicare HMO | Admitting: Oncology

## 2022-10-29 ENCOUNTER — Inpatient Hospital Stay: Payer: Medicare HMO | Admitting: Oncology

## 2022-10-29 ENCOUNTER — Other Ambulatory Visit: Payer: Self-pay

## 2022-10-29 ENCOUNTER — Inpatient Hospital Stay: Payer: Medicare HMO

## 2022-10-29 ENCOUNTER — Encounter: Payer: Self-pay | Admitting: Oncology

## 2022-10-29 ENCOUNTER — Ambulatory Visit: Payer: Medicare HMO

## 2022-10-29 VITALS — BP 123/57 | HR 75 | Temp 97.3°F | Resp 18 | Ht 60.0 in | Wt 108.7 lb

## 2022-10-29 DIAGNOSIS — C3491 Malignant neoplasm of unspecified part of right bronchus or lung: Secondary | ICD-10-CM

## 2022-10-29 DIAGNOSIS — R7989 Other specified abnormal findings of blood chemistry: Secondary | ICD-10-CM

## 2022-10-29 DIAGNOSIS — T451X5A Adverse effect of antineoplastic and immunosuppressive drugs, initial encounter: Secondary | ICD-10-CM

## 2022-10-29 DIAGNOSIS — M8448XS Pathological fracture, other site, sequela: Secondary | ICD-10-CM

## 2022-10-29 DIAGNOSIS — Z87891 Personal history of nicotine dependence: Secondary | ICD-10-CM

## 2022-10-29 DIAGNOSIS — C7951 Secondary malignant neoplasm of bone: Secondary | ICD-10-CM | POA: Diagnosis not present

## 2022-10-29 DIAGNOSIS — D6481 Anemia due to antineoplastic chemotherapy: Secondary | ICD-10-CM

## 2022-10-29 DIAGNOSIS — R110 Nausea: Secondary | ICD-10-CM

## 2022-10-29 DIAGNOSIS — C770 Secondary and unspecified malignant neoplasm of lymph nodes of head, face and neck: Secondary | ICD-10-CM | POA: Diagnosis not present

## 2022-10-29 DIAGNOSIS — Z803 Family history of malignant neoplasm of breast: Secondary | ICD-10-CM

## 2022-10-29 DIAGNOSIS — Z5112 Encounter for antineoplastic immunotherapy: Secondary | ICD-10-CM

## 2022-10-29 LAB — COMPREHENSIVE METABOLIC PANEL
ALT: 12 U/L (ref 0–44)
AST: 23 U/L (ref 15–41)
Albumin: 3.1 g/dL — ABNORMAL LOW (ref 3.5–5.0)
Alkaline Phosphatase: 79 U/L (ref 38–126)
Anion gap: 9 (ref 5–15)
BUN: 31 mg/dL — ABNORMAL HIGH (ref 8–23)
CO2: 22 mmol/L (ref 22–32)
Calcium: 8.8 mg/dL — ABNORMAL LOW (ref 8.9–10.3)
Chloride: 107 mmol/L (ref 98–111)
Creatinine, Ser: 2.02 mg/dL — ABNORMAL HIGH (ref 0.44–1.00)
Glucose, Bld: 107 mg/dL — ABNORMAL HIGH (ref 70–99)
Potassium: 4.7 mmol/L (ref 3.5–5.1)
Sodium: 138 mmol/L (ref 135–145)
Total Bilirubin: 0.3 mg/dL (ref 0.3–1.2)
Total Protein: 6.7 g/dL (ref 6.5–8.1)

## 2022-10-29 LAB — CBC WITH DIFFERENTIAL/PLATELET
Abs Immature Granulocytes: 0.43 10*3/uL — ABNORMAL HIGH (ref 0.00–0.07)
Basophils Absolute: 0 10*3/uL (ref 0.0–0.1)
Basophils Relative: 1 %
Eosinophils Absolute: 0 10*3/uL (ref 0.0–0.5)
Eosinophils Relative: 1 %
HCT: 26.6 % — ABNORMAL LOW (ref 36.0–46.0)
Hemoglobin: 8.9 g/dL — ABNORMAL LOW (ref 12.0–15.0)
Immature Granulocytes: 5 %
Lymphocytes Relative: 22 %
Lymphs Abs: 1.8 10*3/uL (ref 0.7–4.0)
MCH: 33.1 pg (ref 26.0–34.0)
MCHC: 33.5 g/dL (ref 30.0–36.0)
MCV: 98.9 fL (ref 80.0–100.0)
Monocytes Absolute: 1.2 10*3/uL — ABNORMAL HIGH (ref 0.1–1.0)
Monocytes Relative: 14 %
Neutro Abs: 4.9 10*3/uL (ref 1.7–7.7)
Neutrophils Relative %: 57 %
Platelets: 278 10*3/uL (ref 150–400)
RBC: 2.69 MIL/uL — ABNORMAL LOW (ref 3.87–5.11)
RDW: 15.5 % (ref 11.5–15.5)
WBC: 8.4 10*3/uL (ref 4.0–10.5)
nRBC: 0 % (ref 0.0–0.2)

## 2022-10-29 MED ORDER — PALONOSETRON HCL INJECTION 0.25 MG/5ML
0.2500 mg | Freq: Once | INTRAVENOUS | Status: AC
  Administered 2022-10-29: 0.25 mg via INTRAVENOUS

## 2022-10-29 MED ORDER — SODIUM CHLORIDE 0.9 % IV SOLN
10.0000 mg | Freq: Once | INTRAVENOUS | Status: AC
  Administered 2022-10-29: 10 mg via INTRAVENOUS
  Filled 2022-10-29: qty 10

## 2022-10-29 MED ORDER — SODIUM CHLORIDE 0.9 % IV SOLN
Freq: Once | INTRAVENOUS | Status: AC
  Filled 2022-10-29: qty 250

## 2022-10-29 MED ORDER — SODIUM CHLORIDE 0.9 % IV SOLN
200.0000 mg | Freq: Once | INTRAVENOUS | Status: AC
  Administered 2022-10-29: 200 mg via INTRAVENOUS
  Filled 2022-10-29: qty 8

## 2022-10-29 MED ORDER — SODIUM CHLORIDE 0.9% FLUSH
10.0000 mL | Freq: Once | INTRAVENOUS | Status: AC
  Administered 2022-10-29: 10 mL via INTRAVENOUS
  Filled 2022-10-29: qty 10

## 2022-10-29 MED ORDER — HEPARIN SOD (PORK) LOCK FLUSH 100 UNIT/ML IV SOLN
500.0000 [IU] | Freq: Once | INTRAVENOUS | Status: AC | PRN
  Administered 2022-10-29: 500 [IU]
  Filled 2022-10-29: qty 5

## 2022-10-29 NOTE — Progress Notes (Signed)
Keep dex/aloxi with Rande Lawman per MD

## 2022-10-29 NOTE — Progress Notes (Signed)
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  She smoked about 2 packs of cigarettes per day but quit smoking in 2010.  She presented to the ER withSymptoms of significant back pain MRIs lumbar spine without contrast showed pathologic fracture of the L1 vertebral body with posterior bulging of the cortex resulting in mild spinal canal stenosis.  Diffuse T1 hypointense abnormality in the L1 vertebral body with involvement of bilateral pedicles and transverse processes.  CT chest abdomen and pelvis with contrast showed 1 cm left supraclavicular adenopathy.  1 cm right subcarinal lymph node.  Necrotic 3.1 x 2.4 x 2.9 cm anterior mediastinal mass in the left prevascular region with mild mass effect on the brachiocephalic vein.  Possible phrenic nerve involvement.  Mild acute sigmoid colon diverticulitis.    Patient had biopsy of the left supraclavicular lymph node which was consistent with adenocarcinoma of lung primary.  NGS testing did not show any actionable mutations.  K-ras G12 D mutation detected.  MSI stable.  Patient underwent percutaneous fixation of the L1 vertebral body.  She completed palliative radiation treatment to her spine.  She declined adjuvant bisphosphonates.  She received 4 cycles of carbo Alimta Keytruda chemotherapy.  Scan showed partial response and presently patient is on maintenance Alimta and Keytruda   Alimta will be on hold starting mid July due to progressive  anemia  Interval history- reports feeling better after receiving blood transfusion but still has fatigue. Has on and off nausea especially in the mornings  ECOG PS- 1 Pain scale- 0 Opioid associated constipation- no  Review of systems- Review of Systems  Constitutional:  Positive for malaise/fatigue. Negative for chills, fever and weight loss.  HENT:  Negative for congestion, ear discharge and nosebleeds.   Eyes:  Negative for blurred vision.  Respiratory:  Negative for cough, hemoptysis, sputum production, shortness of breath and wheezing.   Cardiovascular:  Negative for chest pain, palpitations, orthopnea and claudication.  Gastrointestinal:  Positive for nausea. Negative for abdominal pain, blood in stool, constipation, diarrhea, heartburn, melena and vomiting.  Genitourinary:  Negative for dysuria, flank pain, frequency, hematuria and urgency.  Musculoskeletal:  Negative for back pain, joint pain and myalgias.  Skin:  Negative for rash.  Neurological:  Negative for dizziness, tingling, focal weakness, seizures, weakness and headaches.  Endo/Heme/Allergies:  Does not bruise/bleed easily.  Psychiatric/Behavioral:  Negative for depression and suicidal ideas. The patient does not have insomnia.       No Known Allergies   Past Medical History:  Diagnosis Date   Arthritis    hands and feet   Cancer associated pain    Cataract    right   Complication of anesthesia    sometimes have a hard time waking up   Diabetes mellitus    Borderline Diabetes   GERD (gastroesophageal reflux disease)    HTN (hypertension)    Hyperlipidemia    Lung cancer (HCC)    Motion sickness    Post corneal transplant    right  Past Surgical History:  Procedure Laterality Date   APPLICATION OF INTRAOPERATIVE CT SCAN  02/15/2022   Procedure: APPLICATION OF INTRAOPERATIVE CT SCAN;  Surgeon: Venetia Night, MD;  Location: [facility] ORS;  Service: Neurosurgery;;   BASAL CELL CARCINOMA EXCISION      Dr. Lorn Junes   CARPAL TUNNEL RELEASE     right   COLONOSCOPY WITH PROPOFOL N/A 12/03/2015   Procedure: COLONOSCOPY WITH PROPOFOL;  Surgeon: Scot Jun, MD;  Location: [facility] ENDOSCOPY;  Service: Endoscopy;  Laterality: N/A;   COLONOSCOPY WITH PROPOFOL N/A 02/22/2020   Procedure: COLONOSCOPY WITH PROPOFOL;  Surgeon: Regis Bill, MD;  Location: [facility] ENDOSCOPY;  Service: Endoscopy;  Laterality: N/A;   ESOPHAGOGASTRODUODENOSCOPY (EGD) WITH PROPOFOL N/A 02/22/2020   Procedure: ESOPHAGOGASTRODUODENOSCOPY (EGD) WITH PROPOFOL;  Surgeon: Regis Bill, MD;  Location: [facility] ENDOSCOPY;  Service: Endoscopy;  Laterality: N/A;   ESOPHAGOGASTRODUODENOSCOPY (EGD) WITH PROPOFOL N/A 06/07/2022   Procedure: ESOPHAGOGASTRODUODENOSCOPY (EGD) WITH PROPOFOL;  Surgeon: Regis Bill, MD;  Location: [facility] ENDOSCOPY;  Service: Endoscopy;  Laterality: N/A;   HAMMER TOE SURGERY Right 05/23/2019   Procedure: HAMMER TOE CORRECTION;  Surgeon: Gwyneth Revels, DPM;  Location: [facility];  Service: Podiatry;  Laterality: Right;  Diabetic   IR BONE TUMOR(S)RF ABLATION  04/30/2022   IR IMAGING GUIDED PORT INSERTION  04/14/2022   IR KYPHO LUMBAR INC FX REDUCE BONE BX UNI/BIL CANNULATION INC/IMAGING  04/30/2022   IR RADIOLOGIST EVAL & MGMT  03/24/2022   IR RADIOLOGIST EVAL & MGMT  06/23/2022   OSTECTOMY Right 05/23/2019   Procedure: DOUBLE OSTEOTOMY RIGHT;  Surgeon: Gwyneth Revels, DPM;  Location: [facility];  Service: Podiatry;  Laterality: Right;   SKIN CANCER EXCISION      Social History   Socioeconomic History   Marital status: Widowed    Spouse name: Not on file   Number of children: Not on file   Years of education: Not on file   Highest education level: Not on file  Occupational History    Employer: [employer]    Comment: Currently not employed  Tobacco Use   Smoking status: Former    Current packs/day: 0.00    Average packs/day: 2.0 packs/day for 20.0 years (40.0 ttl pk-yrs)     Types: Cigarettes    Start date: 01/13/1989    Quit date: 01/13/2009    Years since quitting: 13.8   Smokeless tobacco: Never  Vaping Use   Vaping status: Never Used  Substance and Sexual Activity   Alcohol use: No   Drug use: No   Sexual activity: Not Currently    Birth control/protection: Post-menopausal  Other Topics Concern   Not on file  Social History Narrative   Not on file   Social Determinants of Health   Financial Resource Strain: Not on file  Food Insecurity: No Food Insecurity (08/18/2022)   Hunger Vital Sign    Worried About Running Out of Food in the Last Year: Never true    Ran Out of Food in the Last Year: Never true  Transportation Needs: No Transportation Needs (08/18/2022)   PRAPARE - Administrator, Civil Service (Medical): No    Lack of Transportation (Non-Medical): No  Physical Activity: Not on file  Stress: Not on file  Social Connections: Unknown (08/12/2021)   Received from [facility]   Social Network    Social Network: Not on file  Intimate Partner Violence: Not At Risk (08/18/2022)   Humiliation, Afraid, Rape, and Kick questionnaire  Fear of Current or Ex-Partner: No    Emotionally Abused: No    Physically Abused: No    Sexually Abused: No    Family History  Problem Relation Age of Onset   Heart disease Mother    Heart disease Father    Heart disease Maternal Grandmother    Heart disease Maternal Grandfather    Breast cancer Sister 45     Current Outpatient Medications:    acetaminophen (TYLENOL) 500 MG tablet, Take 500 mg by mouth every 6 (six) hours as needed for moderate pain, mild pain, fever or headache., Disp: , Rfl:    albuterol (PROVENTIL) (2.5 MG/3ML) 0.083% nebulizer solution, Take 3 mLs (2.5 mg total) by nebulization in the morning and at bedtime., Disp: 169 mL, Rfl: 12   Ascorbic Acid (VITAMIN C) 1000 MG tablet, Take 1,000 mg by mouth daily.  , Disp: , Rfl:    carvedilol (COREG) 3.125 MG tablet, Take 3.125 mg by  mouth 2 (two) times daily with a meal., Disp: , Rfl:    cyclobenzaprine (FLEXERIL) 5 MG tablet, Take 1 tablet (5 mg total) by mouth 3 (three) times daily as needed for muscle spasms., Disp: 30 tablet, Rfl: 0   dexamethasone (DECADRON) 4 MG tablet, TAKE 1 TABLET 2 TIMES DAILY STARTING DAY BEFORE PEMETREXED. THEN TAKE 2 TABS DAILY FOR 3 DAYS STARTING DAY AFTER CARBOPLATIN. TAKE WITH FOOD., Disp: 30 tablet, Rfl: 1   Docusate Calcium (STOOL SOFTENER PO), Take 1 capsule by mouth daily as needed (constipation)., Disp: , Rfl:    ELIQUIS 5 MG TABS tablet, TAKE 1 TABLET BY MOUTH TWICE A DAY, Disp: 60 tablet, Rfl: 2   folic acid (FOLVITE) 1 MG tablet, Take 1 tablet (1 mg total) by mouth daily. Start 7 days before pemetrexed chemotherapy. Continue until 21 days after pemetrexed completed., Disp: 100 tablet, Rfl: 3   gabapentin (NEURONTIN) 100 MG capsule, Take 100 mg by mouth 2 (two) times daily., Disp: , Rfl:    lidocaine-prilocaine (EMLA) cream, Apply 1 Application topically as needed., Disp: 30 g, Rfl: 2   LORazepam (ATIVAN) 0.5 MG tablet, Take 1 tablet (0.5 mg total) by mouth every 8 (eight) hours as needed for anxiety., Disp: 30 tablet, Rfl: 0   losartan (COZAAR) 100 MG tablet, Take 100 mg by mouth daily., Disp: , Rfl:    metFORMIN (GLUCOPHAGE) 500 MG tablet, Take 1 tablet (500 mg total) by mouth 2 (two) times daily with a meal., Disp: , Rfl:    Multiple Vitamin (MULTIVITAMIN) capsule, Take 1 capsule by mouth daily.  , Disp: , Rfl:    omeprazole (PRILOSEC) 20 MG capsule, Take 1 capsule (20 mg total) by mouth daily., Disp: 90 capsule, Rfl: 3   ondansetron (ZOFRAN) 8 MG tablet, Take 1 tablet (8 mg total) by mouth every 8 (eight) hours as needed for nausea or vomiting. Start on the third day after carboplatin., Disp: 30 tablet, Rfl: 1   Oxycodone HCl 20 MG TABS, Take 0.5-1 tablets (10-20 mg total) by mouth every 4 (four) hours as needed., Disp: 60 tablet, Rfl: 0   polyethylene glycol (MIRALAX / GLYCOLAX) 17 g  packet, Take 17 g by mouth daily., Disp: 14 each, Rfl: 0   prednisoLONE acetate (PRED MILD) 0.12 % ophthalmic suspension, Place 1 drop into both eyes daily., Disp: , Rfl:    prochlorperazine (COMPAZINE) 10 MG tablet, Take 1 tablet (10 mg total) by mouth every 6 (six) hours as needed for nausea or vomiting., Disp: 30 tablet, Rfl:  2   simvastatin (ZOCOR) 40 MG tablet, TAKE 1 TABLET EVERY DAY  AT  6:00 PM, Disp: 90 tablet, Rfl: 3   sodium chloride HYPERTONIC 3 % nebulizer solution, Take by nebulization in the morning and at bedtime., Disp: 750 mL, Rfl: 12 No current facility-administered medications for this visit.  Facility-Administered Medications Ordered in Other Visits:    0.9 %  sodium chloride infusion, , Intravenous, Continuous, Borders, Daryl Eastern, NP, Stopped at 08/09/22 1434   sodium chloride flush (NS) 0.9 % injection 10 mL, 10 mL, Intracatheter, PRN, Creig Hines, MD, 10 mL at 09/17/22 1112  Physical exam:  Vitals:   10/29/22 1106  BP: (!) 123/57  Pulse: 75  Resp: 18  Temp: (!) 97.3 F (36.3 C)  TempSrc: Tympanic  SpO2: 97%  Weight: 108 lb 11.2 oz (49.3 kg)  Height: 5' (1.524 m)   Physical Exam Constitutional:      Comments: Appears fatigued  Cardiovascular:     Rate and Rhythm: Normal rate and regular rhythm.     Heart sounds: Normal heart sounds.  Pulmonary:     Effort: Pulmonary effort is normal.     Breath sounds: Normal breath sounds.  Abdominal:     General: Bowel sounds are normal.     Palpations: Abdomen is soft.  Skin:    General: Skin is warm and dry.  Neurological:     Mental Status: She is alert and oriented to person, place, and time.         Latest Ref Rng & Units 10/08/2022    9:02 AM  CMP  Glucose 70 - 99 mg/dL 606   BUN 8 - 23 mg/dL 23   Creatinine 3.01 - 1.00 mg/dL 6.01   Sodium 093 - 235 mmol/L 138   Potassium 3.5 - 5.1 mmol/L 4.0   Chloride 98 - 111 mmol/L 105   CO2 22 - 32 mmol/L 22   Calcium 8.9 - 10.3 mg/dL 8.6   Total Protein 6.5  - 8.1 g/dL 6.8   Total Bilirubin 0.3 - 1.2 mg/dL 0.1   Alkaline Phos 38 - 126 U/L 87   AST 15 - 41 U/L 29   ALT 0 - 44 U/L 19       Latest Ref Rng & Units 10/29/2022   10:54 AM  CBC  WBC 4.0 - 10.5 K/uL 8.4   Hemoglobin 12.0 - 15.0 g/dL 8.9   Hematocrit 57.3 - 46.0 % 26.6   Platelets 150 - 400 K/uL 278     No images are attached to the encounter.  CT CHEST ABDOMEN PELVIS W CONTRAST  Result Date: 10/07/2022 CLINICAL DATA:  Metastatic lung cancer restaging * Tracking Code: BO * EXAM: CT CHEST, ABDOMEN, AND PELVIS WITH CONTRAST TECHNIQUE: Multidetector CT imaging of the chest, abdomen and pelvis was performed following the standard protocol during bolus administration of intravenous contrast. RADIATION DOSE REDUCTION: This exam was performed according to the departmental dose-optimization program which includes automated exposure control, adjustment of the mA and/or kV according to patient size and/or use of iterative reconstruction technique. CONTRAST:  75mL OMNIPAQUE IOHEXOL 300 MG/ML SOLN additional oral enteric contrast COMPARISON:  CT abdomen pelvis, 08/17/2022, CT chest abdomen pelvis, 07/02/2022 FINDINGS: CT CHEST FINDINGS Cardiovascular: Right chest port catheter. Aortic atherosclerosis. Normal heart size. Left and right coronary artery calcifications. No pericardial effusion. Mediastinum/Nodes: Unchanged treated anterior mediastinal/prevascular soft tissue (series 3, image 17). No persistently enlarged mediastinal, hilar, or axillary lymph nodes. Thyroid gland, trachea, and esophagus demonstrate  no significant findings. Lungs/Pleura: Mild centrilobular emphysema. Diffuse bilateral bronchial wall thickening. Unchanged post treatment appearance of the left lower lobe with dense fibrotic consolidation and volume loss (series 4, image 68). Slightly improved clustered centrilobular and tree-in-bud nodularity, particularly in the right lung (series 4, image 32, 96). Unchanged 0.6 cm nodule of the  lingula (series 4, image 70). No pleural effusion or pneumothorax. Musculoskeletal: No chest wall abnormality. No acute osseous findings. Unchanged, subtly sclerotic treated osseous metastasis of the posterior right ninth rib (series 3, image 34). CT ABDOMEN PELVIS FINDINGS Hepatobiliary: No solid liver abnormality is seen. Simple cyst of the inferior right lobe of the liver, for which no further follow-up or characterization is required. No gallstones, gallbladder wall thickening, or biliary dilatation. Pancreas: Unremarkable. No pancreatic ductal dilatation or surrounding inflammatory changes. Spleen: Normal in size without significant abnormality. Adrenals/Urinary Tract: Adrenal glands are unremarkable. Kidneys are normal, without renal calculi, solid lesion, or hydronephrosis. Bladder is unremarkable. Stomach/Bowel: Stomach is within normal limits. Appendix appears normal. No evidence of bowel wall thickening, distention, or inflammatory changes. Large burden of stool throughout the colon. Descending and sigmoid diverticulosis. Vascular/Lymphatic: Aortic atherosclerosis. No enlarged abdominal or pelvic lymph nodes. Reproductive: No mass or other abnormality. Other: No abdominal wall hernia or abnormality. No ascites. Musculoskeletal: No acute osseous findings. Unchanged bridging fusion of T11 through L2 about a sclerotic fracture of L1 status post vertebral cement augmentation (series 6, image 79). IMPRESSION: 1. Unchanged post treatment/post radiation appearance of the left lower lobe with dense fibrotic consolidation and volume loss. 2. Unchanged treated anterior mediastinal/prevascular soft tissue. Subcarinal lymph nodes remain resolved. No persistently enlarged nodes. 3. Unchanged, subtly sclerotic treated osseous metastasis of the posterior right ninth rib. 4. No evidence of new metastatic disease in the chest, abdomen, or pelvis. 5. Slightly improved clustered centrilobular and tree-in-bud nodularity,  particularly in the right lung, consistent with improved atypical mycobacterial infection. Morphology of nodules present on today's examination is generally most consistent with infection or inflammation, although there may be underlying treated pulmonary metastatic disease given course of change over time. 6. Unchanged 0.6 cm nodule of the lingula, nonspecific. Attention on follow-up. 7. Emphysema and diffuse bilateral bronchial wall thickening. 8. Coronary artery disease. Aortic Atherosclerosis (ICD10-I70.0) and Emphysema (ICD10-J43.9). Electronically Signed   By: Jearld Lesch M.D.   On: 10/07/2022 07:12     Assessment and plan- Patient is a 72 y.o. 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 It is possible that her anemia is from alimta. Recent iron studies have been normal. If anemia persists or worsens, I will do further work up. I am holding off on giving her alimta today. She will only get [place] moving forward until anemia improves.  I have reviewed CT chest abdomen plevis images indepedently and discussed findings with the patient which shows stable lung mass in the left upper lobe, subcarinal adenopathy has resolved. Unchanged bridging fusion from T11- L2. Continue keytruda until progression or toxicity. I will see her in 3 weeks for next cycle  Bone mets- she has refused bisphosphonates  Chemo induced nausea- continue prn nausea meds. Hopefully should improve with stopping alimta   Visit Diagnosis 1. Primary malignant neoplasm of right lung metastatic to other site [facility])   2. Encounter for antineoplastic immunotherapy   3. Anemia due to antineoplastic chemotherapy   4. Chemotherapy-induced nausea  Dr. Owens Shark, MD, MPH [facility] [phone] 10/29/2022 11:21 AM

## 2022-10-30 ENCOUNTER — Other Ambulatory Visit: Payer: Self-pay | Admitting: Oncology

## 2022-11-01 ENCOUNTER — Encounter: Payer: Self-pay | Admitting: Oncology

## 2022-11-01 ENCOUNTER — Other Ambulatory Visit: Payer: Self-pay | Admitting: *Deleted

## 2022-11-01 DIAGNOSIS — C3491 Malignant neoplasm of unspecified part of right bronchus or lung: Secondary | ICD-10-CM

## 2022-11-01 MED ORDER — OXYCODONE HCL 20 MG PO TABS
10.0000 mg | ORAL_TABLET | ORAL | 0 refills | Status: DC | PRN
Start: 2022-11-01 — End: 2022-11-19

## 2022-11-02 ENCOUNTER — Other Ambulatory Visit: Payer: Self-pay | Admitting: *Deleted

## 2022-11-02 DIAGNOSIS — C3491 Malignant neoplasm of unspecified part of right bronchus or lung: Secondary | ICD-10-CM

## 2022-11-02 DIAGNOSIS — R112 Nausea with vomiting, unspecified: Secondary | ICD-10-CM

## 2022-11-02 MED ORDER — ONDANSETRON HCL 8 MG PO TABS: 8.0000 mg | ORAL_TABLET | Freq: Three times a day (TID) | ORAL | 1 refills | Status: DC | PRN

## 2022-11-02 MED ORDER — PROCHLORPERAZINE MALEATE 10 MG PO TABS: 10.0000 mg | ORAL_TABLET | Freq: Four times a day (QID) | ORAL | 2 refills | Status: DC | PRN

## 2022-11-04 ENCOUNTER — Ambulatory Visit: Payer: Self-pay | Admitting: Neurosurgery

## 2022-11-04 DIAGNOSIS — M8448XS Pathological fracture, other site, sequela: Secondary | ICD-10-CM

## 2022-11-11 ENCOUNTER — Emergency Department: Payer: Medicare HMO

## 2022-11-11 ENCOUNTER — Inpatient Hospital Stay: Payer: Medicare HMO

## 2022-11-11 ENCOUNTER — Inpatient Hospital Stay (HOSPITAL_BASED_OUTPATIENT_CLINIC_OR_DEPARTMENT_OTHER): Payer: Medicare HMO | Admitting: Nurse Practitioner

## 2022-11-11 ENCOUNTER — Ambulatory Visit
Admission: RE | Admit: 2022-11-11 | Discharge: 2022-11-11 | Disposition: A | Discharge status: Home / Self Care | Payer: Medicare HMO | Source: Home / Self Care | Attending: Nurse Practitioner | Admitting: Nurse Practitioner

## 2022-11-11 ENCOUNTER — Other Ambulatory Visit: Payer: Self-pay | Admitting: *Deleted

## 2022-11-11 ENCOUNTER — Inpatient Hospital Stay: Payer: Medicare HMO | Attending: Oncology

## 2022-11-11 ENCOUNTER — Emergency Department
Admission: EM | Admit: 2022-11-11 | Discharge: 2022-11-12 | Disposition: A | Discharge status: Home / Self Care | Payer: Medicare HMO | Attending: Emergency Medicine | Admitting: Emergency Medicine

## 2022-11-11 ENCOUNTER — Ambulatory Visit
Admission: RE | Admit: 2022-11-11 | Discharge: 2022-11-11 | Disposition: A | Discharge status: Home / Self Care | Payer: Medicare HMO | Source: Ambulatory Visit | Attending: Nurse Practitioner | Admitting: Nurse Practitioner

## 2022-11-11 VITALS — BP 107/67 | HR 97 | Temp 98.1°F | Resp 20 | Ht 60.0 in | Wt 104.0 lb

## 2022-11-11 DIAGNOSIS — Z87891 Personal history of nicotine dependence: Secondary | ICD-10-CM

## 2022-11-11 DIAGNOSIS — R339 Retention of urine, unspecified: Secondary | ICD-10-CM | POA: Insufficient documentation

## 2022-11-11 DIAGNOSIS — R0602 Shortness of breath: Secondary | ICD-10-CM

## 2022-11-11 DIAGNOSIS — C7951 Secondary malignant neoplasm of bone: Secondary | ICD-10-CM | POA: Diagnosis not present

## 2022-11-11 DIAGNOSIS — R4182 Altered mental status, unspecified: Secondary | ICD-10-CM | POA: Diagnosis not present

## 2022-11-11 DIAGNOSIS — C3491 Malignant neoplasm of unspecified part of right bronchus or lung: Secondary | ICD-10-CM

## 2022-11-11 DIAGNOSIS — Z85118 Personal history of other malignant neoplasm of bronchus and lung: Secondary | ICD-10-CM | POA: Diagnosis not present

## 2022-11-11 DIAGNOSIS — R109 Unspecified abdominal pain: Secondary | ICD-10-CM | POA: Diagnosis not present

## 2022-11-11 DIAGNOSIS — R059 Cough, unspecified: Secondary | ICD-10-CM

## 2022-11-11 DIAGNOSIS — R062 Wheezing: Secondary | ICD-10-CM

## 2022-11-11 DIAGNOSIS — R441 Visual hallucinations: Secondary | ICD-10-CM

## 2022-11-11 DIAGNOSIS — K5900 Constipation, unspecified: Secondary | ICD-10-CM | POA: Insufficient documentation

## 2022-11-11 DIAGNOSIS — N179 Acute kidney failure, unspecified: Secondary | ICD-10-CM | POA: Insufficient documentation

## 2022-11-11 DIAGNOSIS — G893 Neoplasm related pain (acute) (chronic): Secondary | ICD-10-CM | POA: Insufficient documentation

## 2022-11-11 DIAGNOSIS — R410 Disorientation, unspecified: Secondary | ICD-10-CM

## 2022-11-11 DIAGNOSIS — R440 Auditory hallucinations: Secondary | ICD-10-CM | POA: Insufficient documentation

## 2022-11-11 DIAGNOSIS — Z803 Family history of malignant neoplasm of breast: Secondary | ICD-10-CM | POA: Insufficient documentation

## 2022-11-11 LAB — CMP (CANCER CENTER ONLY)
ALT: 10 U/L (ref 0–44)
AST: 20 U/L (ref 15–41)
Albumin: 2.9 g/dL — ABNORMAL LOW (ref 3.5–5.0)
Alkaline Phosphatase: 73 U/L (ref 38–126)
Anion gap: 9 (ref 5–15)
BUN: 30 mg/dL — ABNORMAL HIGH (ref 8–23)
CO2: 22 mmol/L (ref 22–32)
Calcium: 8.6 mg/dL — ABNORMAL LOW (ref 8.9–10.3)
Chloride: 107 mmol/L (ref 98–111)
Creatinine: 2.32 mg/dL — ABNORMAL HIGH (ref 0.44–1.00)
GFR, Estimated: 22 mL/min — ABNORMAL LOW (ref >60)
Glucose, Bld: 110 mg/dL — ABNORMAL HIGH (ref 70–99)
Potassium: 4.6 mmol/L (ref 3.5–5.1)
Sodium: 138 mmol/L (ref 135–145)
Total Bilirubin: 0.5 mg/dL (ref 0.3–1.2)
Total Protein: 7 g/dL (ref 6.5–8.1)

## 2022-11-11 LAB — CBC WITH DIFFERENTIAL (CANCER CENTER ONLY)
Abs Immature Granulocytes: 0.11 10*3/uL — ABNORMAL HIGH (ref 0.00–0.07)
Basophils Absolute: 0 10*3/uL (ref 0.0–0.1)
Basophils Relative: 1 %
Eosinophils Absolute: 0.2 10*3/uL (ref 0.0–0.5)
Eosinophils Relative: 2 %
HCT: 26.7 % — ABNORMAL LOW (ref 36.0–46.0)
Hemoglobin: 8.5 g/dL — ABNORMAL LOW (ref 12.0–15.0)
Immature Granulocytes: 1 %
Lymphocytes Relative: 20 %
Lymphs Abs: 1.5 10*3/uL (ref 0.7–4.0)
MCH: 32.8 pg (ref 26.0–34.0)
MCHC: 31.8 g/dL (ref 30.0–36.0)
MCV: 103.1 fL — ABNORMAL HIGH (ref 80.0–100.0)
Monocytes Absolute: 1 10*3/uL (ref 0.1–1.0)
Monocytes Relative: 14 %
Neutro Abs: 4.8 10*3/uL (ref 1.7–7.7)
Neutrophils Relative %: 62 %
Platelet Count: 113 10*3/uL — ABNORMAL LOW (ref 150–400)
RBC: 2.59 MIL/uL — ABNORMAL LOW (ref 3.87–5.11)
RDW: 17.4 % — ABNORMAL HIGH (ref 11.5–15.5)
WBC Count: 7.7 10*3/uL (ref 4.0–10.5)
nRBC: 0 % (ref 0.0–0.2)

## 2022-11-11 LAB — URINALYSIS, ROUTINE W REFLEX MICROSCOPIC
Bilirubin Urine: NEGATIVE
Glucose, UA: NEGATIVE mg/dL
Hgb urine dipstick: NEGATIVE
Ketones, ur: NEGATIVE mg/dL
Leukocytes,Ua: NEGATIVE
Nitrite: NEGATIVE
Protein, ur: NEGATIVE mg/dL
Specific Gravity, Urine: 1.009 (ref 1.005–1.030)
pH: 6 (ref 5.0–8.0)

## 2022-11-11 LAB — CBC
HCT: 28 % — ABNORMAL LOW (ref 36.0–46.0)
Hemoglobin: 8.9 g/dL — ABNORMAL LOW (ref 12.0–15.0)
MCH: 33 pg (ref 26.0–34.0)
MCHC: 31.8 g/dL (ref 30.0–36.0)
MCV: 103.7 fL — ABNORMAL HIGH (ref 80.0–100.0)
Platelets: 111 10*3/uL — ABNORMAL LOW (ref 150–400)
RBC: 2.7 MIL/uL — ABNORMAL LOW (ref 3.87–5.11)
RDW: 17.7 % — ABNORMAL HIGH (ref 11.5–15.5)
WBC: 8.3 10*3/uL (ref 4.0–10.5)
nRBC: 0 % (ref 0.0–0.2)

## 2022-11-11 LAB — COMPREHENSIVE METABOLIC PANEL
ALT: 10 U/L (ref 0–44)
AST: 21 U/L (ref 15–41)
Albumin: 2.9 g/dL — ABNORMAL LOW (ref 3.5–5.0)
Alkaline Phosphatase: 78 U/L (ref 38–126)
Anion gap: 7 (ref 5–15)
BUN: 31 mg/dL — ABNORMAL HIGH (ref 8–23)
CO2: 24 mmol/L (ref 22–32)
Calcium: 8.5 mg/dL — ABNORMAL LOW (ref 8.9–10.3)
Chloride: 105 mmol/L (ref 98–111)
Creatinine, Ser: 2.32 mg/dL — ABNORMAL HIGH (ref 0.44–1.00)
GFR, Estimated: 22 mL/min — ABNORMAL LOW (ref >60)
Glucose, Bld: 126 mg/dL — ABNORMAL HIGH (ref 70–99)
Potassium: 4.5 mmol/L (ref 3.5–5.1)
Sodium: 136 mmol/L (ref 135–145)
Total Bilirubin: 0.4 mg/dL (ref 0.3–1.2)
Total Protein: 7 g/dL (ref 6.5–8.1)

## 2022-11-11 LAB — SAMPLE TO BLOOD BANK

## 2022-11-11 MED ORDER — HEPARIN SOD (PORK) LOCK FLUSH 100 UNIT/ML IV SOLN
500.0000 [IU] | Freq: Once | INTRAVENOUS | Status: AC
  Filled 2022-11-11: qty 5

## 2022-11-11 MED ORDER — SODIUM CHLORIDE 0.9% FLUSH
10.0000 mL | Freq: Once | INTRAVENOUS | Status: AC
  Administered 2022-11-11: 10 mL via INTRAVENOUS
  Filled 2022-11-11: qty 10

## 2022-11-11 NOTE — ED Triage Notes (Signed)
Pt presents to the ED POV from home with daughter. Pt has a hx of lung cancer and was seen at the cancer clinic today. Pt able to answer all orientation questions. Daughter states that she has been seeing people and saying things that weren't true. Daughter states that patient referred to her as her sister yesterday. Daughter states that this all started on Tuesday. No hx of brain mets. While sitting in triage patient asked "where'd that little girl go?" Daughter asked her what she was talking about and patient pointed to the floor and stated "that little girl that was right there".

## 2022-11-11 NOTE — Discharge Instructions (Signed)
Please seek medical attention for any high fevers, chest pain, shortness of breath, change in behavior, persistent vomiting, bloody stool or any other new or concerning symptoms.  

## 2022-11-11 NOTE — Progress Notes (Signed)
Symptom Management Clinic  [facility] Department of the [place]. [facility] [address], [address] 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 Per daughter, patient has been increasingly confused over past couple of days and symptoms are worsening. She reports auditory and visual hallucinations. She has increasing gait disturbance and generalized weakness. She had oxygen readings in 70s last night which improved to 90s with supplemental oxygen. She's now on room air without hypoxia. Some cough and 'sounds wet'. She's also concerned for UTI. No fevers or chills. She has chronic nausea and bilous reflux which is unchanged. No new pain.   Review of systems- Review of Systems  Unable to perform ROS: Mental status change     Current treatment- maintenance keytruda  No Known Allergies  Past Medical History:   Diagnosis Date   Arthritis    hands and feet   Cancer associated pain    Cataract    right   Complication of anesthesia    sometimes have a hard time waking up   Diabetes mellitus    Borderline Diabetes   GERD (gastroesophageal reflux disease)    HTN (hypertension)    Hyperlipidemia    Lung cancer (HCC)    Motion sickness    Post corneal transplant    right    Past Surgical History:  Procedure Laterality Date   APPLICATION OF INTRAOPERATIVE CT SCAN  02/15/2022   Procedure: APPLICATION OF INTRAOPERATIVE CT SCAN;  Surgeon: Venetia Night, MD;  Location: [facility] ORS;  Service: Neurosurgery;;   BASAL CELL CARCINOMA EXCISION     Dr. Lorn Junes   CARPAL TUNNEL RELEASE     right   COLONOSCOPY WITH PROPOFOL N/A 12/03/2015   Procedure: COLONOSCOPY WITH PROPOFOL;  Surgeon: Scot Jun, MD;  Location: [facility] ENDOSCOPY;  Service: Endoscopy;  Laterality: N/A;   COLONOSCOPY WITH PROPOFOL N/A 02/22/2020   Procedure: COLONOSCOPY WITH PROPOFOL;  Surgeon: Regis Bill, MD;  Location: [facility] ENDOSCOPY;  Service: Endoscopy;  Laterality: N/A;   ESOPHAGOGASTRODUODENOSCOPY (EGD) WITH PROPOFOL N/A 02/22/2020   Procedure: ESOPHAGOGASTRODUODENOSCOPY (EGD) WITH PROPOFOL;  Surgeon: Regis Bill, MD;  Location: [facility] ENDOSCOPY;  Service: Endoscopy;  Laterality: N/A;   ESOPHAGOGASTRODUODENOSCOPY (EGD) WITH PROPOFOL N/A 06/07/2022   Procedure: ESOPHAGOGASTRODUODENOSCOPY (EGD) WITH PROPOFOL;  Surgeon: Regis Bill, MD;  Location: [facility] ENDOSCOPY;  Service: Endoscopy;  Laterality: N/A;   HAMMER TOE SURGERY Right 05/23/2019  Procedure: HAMMER TOE CORRECTION;  Surgeon: Gwyneth Revels, DPM;  Location: [facility];  Service: Podiatry;  Laterality: Right;  Diabetic   IR BONE TUMOR(S)RF ABLATION  04/30/2022   IR IMAGING GUIDED PORT INSERTION  04/14/2022   IR KYPHO LUMBAR INC FX REDUCE BONE BX UNI/BIL CANNULATION INC/IMAGING  04/30/2022   IR RADIOLOGIST EVAL & MGMT  03/24/2022   IR RADIOLOGIST  EVAL & MGMT  06/23/2022   OSTECTOMY Right 05/23/2019   Procedure: DOUBLE OSTEOTOMY RIGHT;  Surgeon: Gwyneth Revels, DPM;  Location: [facility];  Service: Podiatry;  Laterality: Right;   SKIN CANCER EXCISION      Social History   Socioeconomic History   Marital status: Widowed    Spouse name: Not on file   Number of children: Not on file   Years of education: Not on file   Highest education level: Not on file  Occupational History    Employer: [employer]    Comment: Currently not employed  Tobacco Use   Smoking status: Former    Current packs/day: 0.00    Average packs/day: 2.0 packs/day for 20.0 years (40.0 ttl pk-yrs)    Types: Cigarettes    Start date: 01/13/1989    Quit date: 01/13/2009    Years since quitting: 13.8   Smokeless tobacco: Never  Vaping Use   Vaping status: Never Used  Substance and Sexual Activity   Alcohol use: No   Drug use: No   Sexual activity: Not Currently    Birth control/protection: Post-menopausal  Other Topics Concern   Not on file  Social History Narrative   Not on file   Social Determinants of Health   Financial Resource Strain: Not on file  Food Insecurity: No Food Insecurity (08/18/2022)   Hunger Vital Sign    Worried About Running Out of Food in the Last Year: Never true    Ran Out of Food in the Last Year: Never true  Transportation Needs: No Transportation Needs (08/18/2022)   PRAPARE - Administrator, Civil Service (Medical): No    Lack of Transportation (Non-Medical): No  Physical Activity: Not on file  Stress: Not on file  Social Connections: Unknown (08/12/2021)   Received from [facility]   Social Network    Social Network: Not on file  Intimate Partner Violence: Not At Risk (08/18/2022)   Humiliation, Afraid, Rape, and Kick questionnaire    Fear of Current or Ex-Partner: No    Emotionally Abused: No    Physically Abused: No    Sexually Abused: No    Family History  Problem Relation Age of Onset   Heart  disease Mother    Heart disease Father    Heart disease Maternal Grandmother    Heart disease Maternal Grandfather    Breast cancer Sister 58     Current Outpatient Medications:    acetaminophen (TYLENOL) 500 MG tablet, Take 500 mg by mouth every 6 (six) hours as needed for moderate pain, mild pain, fever or headache., Disp: , Rfl:    albuterol (PROVENTIL) (2.5 MG/3ML) 0.083% nebulizer solution, Take 3 mLs (2.5 mg total) by nebulization in the morning and at bedtime., Disp: 169 mL, Rfl: 12   Ascorbic Acid (VITAMIN C) 1000 MG tablet, Take 1,000 mg by mouth daily.  , Disp: , Rfl:    carvedilol (COREG) 3.125 MG tablet, Take 3.125 mg by mouth 2 (two) times daily with a meal., Disp: , Rfl:    cyclobenzaprine (FLEXERIL) 5 MG tablet, Take  1 tablet (5 mg total) by mouth 3 (three) times daily as needed for muscle spasms., Disp: 30 tablet, Rfl: 0   dexamethasone (DECADRON) 4 MG tablet, TAKE 1 TABLET 2 TIMES DAILY STARTING DAY BEFORE PEMETREXED. THEN TAKE 2 TABS DAILY FOR 3 DAYS STARTING DAY AFTER CARBOPLATIN. TAKE WITH FOOD., Disp: 30 tablet, Rfl: 1   Docusate Calcium (STOOL SOFTENER PO), Take 1 capsule by mouth daily as needed (constipation)., Disp: , Rfl:    ELIQUIS 5 MG TABS tablet, TAKE 1 TABLET BY MOUTH TWICE A DAY, Disp: 60 tablet, Rfl: 2   folic acid (FOLVITE) 1 MG tablet, Take 1 tablet (1 mg total) by mouth daily. Start 7 days before pemetrexed chemotherapy. Continue until 21 days after pemetrexed completed., Disp: 100 tablet, Rfl: 3   gabapentin (NEURONTIN) 100 MG capsule, Take 100 mg by mouth 2 (two) times daily., Disp: , Rfl:    lidocaine-prilocaine (EMLA) cream, Apply 1 Application topically as needed., Disp: 30 g, Rfl: 2   LORazepam (ATIVAN) 0.5 MG tablet, Take 1 tablet (0.5 mg total) by mouth every 8 (eight) hours as needed for anxiety., Disp: 30 tablet, Rfl: 0   losartan (COZAAR) 100 MG tablet, Take 100 mg by mouth daily., Disp: , Rfl:    metFORMIN (GLUCOPHAGE) 500 MG tablet, Take 1 tablet  (500 mg total) by mouth 2 (two) times daily with a meal., Disp: , Rfl:    Multiple Vitamin (MULTIVITAMIN) capsule, Take 1 capsule by mouth daily.  , Disp: , Rfl:    omeprazole (PRILOSEC) 20 MG capsule, Take 1 capsule (20 mg total) by mouth daily., Disp: 90 capsule, Rfl: 3   ondansetron (ZOFRAN) 8 MG tablet, Take 1 tablet (8 mg total) by mouth every 8 (eight) hours as needed for nausea or vomiting., Disp: 30 tablet, Rfl: 1   Oxycodone HCl 20 MG TABS, Take 0.5-1 tablets (10-20 mg total) by mouth every 4 (four) hours as needed., Disp: 60 tablet, Rfl: 0   polyethylene glycol (MIRALAX / GLYCOLAX) 17 g packet, Take 17 g by mouth daily., Disp: 14 each, Rfl: 0   prednisoLONE acetate (PRED MILD) 0.12 % ophthalmic suspension, Place 1 drop into both eyes daily., Disp: , Rfl:    prochlorperazine (COMPAZINE) 10 MG tablet, Take 1 tablet (10 mg total) by mouth every 6 (six) hours as needed for nausea or vomiting., Disp: 30 tablet, Rfl: 2   simvastatin (ZOCOR) 40 MG tablet, TAKE 1 TABLET EVERY DAY  AT  6:00 PM, Disp: 90 tablet, Rfl: 3   sodium chloride HYPERTONIC 3 % nebulizer solution, Take by nebulization in the morning and at bedtime., Disp: 750 mL, Rfl: 12 No current facility-administered medications for this visit.  Facility-Administered Medications Ordered in Other Visits:    0.9 %  sodium chloride infusion, , Intravenous, Continuous, Borders, Daryl Eastern, NP, Stopped at 08/09/22 1434   heparin lock flush 100 unit/mL, 500 Units, Intravenous, Once, Alinda Dooms, NP   sodium chloride flush (NS) 0.9 % injection 10 mL, 10 mL, Intracatheter, PRN, Creig Hines, MD, 10 mL at 09/17/22 1112  Physical exam:  Vitals:   11/11/22 1449  BP: 107/67  Pulse: 97  Resp: 20  Temp: 98.1 F (36.7 C)  TempSrc: Tympanic  SpO2: 92%  Weight: 104 lb (47.2 kg)  Height: 5' (1.524 m)   Physical Exam Vitals reviewed.  Constitutional:      Comments: Frail appearing. Thin build. Accompanied by daughter.   Pulmonary:      Effort: Pulmonary effort is  normal. No respiratory distress.  Musculoskeletal:     Comments: Slowed shuffling gait  Neurological:     Mental Status: She is alert. She is disoriented.     Comments: Oriented to name, year, place. Unsure of why she is in clinic. Not able to follow along with conversation. Unable to answer questions or provide history. Spastic movements. Unable to coordinate movements to drink water from cup without spillage.   Psychiatric:        Mood and Affect: Mood normal.        Speech: Speech normal.        Latest Ref Rng & Units 11/11/2022    2:38 PM  CMP  Glucose 70 - 99 mg/dL 086   BUN 8 - 23 mg/dL 30   Creatinine 5.78 - 1.00 mg/dL 4.69   Sodium 629 - 528 mmol/L 138   Potassium 3.5 - 5.1 mmol/L 4.6   Chloride 98 - 111 mmol/L 107   CO2 22 - 32 mmol/L 22   Calcium 8.9 - 10.3 mg/dL 8.6   Total Protein 6.5 - 8.1 g/dL 7.0   Total Bilirubin 0.3 - 1.2 mg/dL 0.5   Alkaline Phos 38 - 126 U/L 73   AST 15 - 41 U/L 20   ALT 0 - 44 U/L 10       Latest Ref Rng & Units 11/11/2022    2:38 PM  CBC  WBC 4.0 - 10.5 K/uL 7.7   Hemoglobin 12.0 - 15.0 g/dL 8.5   Hematocrit 41.3 - 46.0 % 26.7   Platelets 150 - 400 K/uL 113     No images are attached to the encounter.  DG Chest 2 View  Result Date: 11/11/2022 CLINICAL DATA:  Shortness of breath and wheezing. History of metastatic lung cancer. EXAM: CHEST - 2 VIEW COMPARISON:  09/23/2022 FINDINGS: Right lung clear. Similar streaky and consolidative opacity at the left base with possible small left pleural effusion. The cardiopericardial silhouette is within normal limits for size. Right Port-A-Cath tip overlies the mid to distal SVC level. Thoracolumbar fusion hardware has been incompletely visualized. IMPRESSION: Similar streaky and consolidative opacity, potentially post treatment scarring, at the left base with possible small left pleural effusion. Electronically Signed   By: Kennith Center M.D.   On: 11/11/2022 15:07     Assessment and plan- Patient is a 72 y.o. female who presents to clinic for complaints of   Altered mental status- disoriented. Auditory and visual hallucinations. Slowed gait. Question infection vs other etiologies including brain metastases (less likely given recent stable scan). Recommend hospital for workup and management. Chest xray was not definitive for pneumonia though limited by scarring. Daughter agrees.  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 CT C/A/P from June 2024 was stable. Last brain imaging NOvember 2023 was negative.   Disposition:  ER- report called.    Visit Diagnosis 1. Disorientation   2. Altered mental status, unspecified altered mental status type   3. Primary malignant neoplasm of right lung metastatic to other site [facility])    Patient expressed understanding and was in agreement with this plan. She also understands that She can call clinic at any time with any questions, concerns, or complaints.   Thank you for allowing me to participate in the care of this very pleasant patient.   Consuello Masse, DNP, AGNP-C, AOCNP [facility] [phone]

## 2022-11-11 NOTE — ED Provider Notes (Signed)
[facility] Provider Note    Event Date/Time   First MD Initiated Contact with Patient 11/11/22 1703     (approximate)   History   Altered Mental Status   HPI  Amber Glenn is a 72 y.o. female who presents to the emergency department today because of concerns for altered mental status.  History is primarily obtained from daughter at bedside.  She states that the patient has been having increased hallucinations over the past few days.  She has had somewhat similar episodes in the past but never this severe.  Patient has had both pneumonia and UTIs in the past.  Had x-ray done in clinic already today which did not show any concerning findings per daughter.  Additionally patient has had diverticulitis in the past.      Physical Exam   Triage Vital Signs: ED Triage Vitals [11/11/22 1543]  Encounter Vitals Group     BP 107/64     Systolic BP Percentile      Diastolic BP Percentile      Pulse Rate 94     Resp 18     Temp 98.7 F (37.1 C)     Temp Source Oral     SpO2 92 %     Weight 104 lb (47.2 kg)     Height 5' (1.524 m)     Head Circumference      Peak Flow      Pain Score 0     Pain Loc      Pain Education      Exclude from Growth Chart     Most recent vital signs: Vitals:   11/11/22 1543  BP: 107/64  Pulse: 94  Resp: 18  Temp: 98.7 F (37.1 C)  SpO2: 92%   General: Awake, alert, not completely oriented.  CV:  Good peripheral perfusion.  Resp:  Normal effort.  Abd:  No distention. Slight tenderness to palpation of the left abdomen. Other:  Not responding to internal stimuli.   ED Results / Procedures / Treatments   Labs (all labs ordered are listed, but only abnormal results are displayed) Labs Reviewed  COMPREHENSIVE METABOLIC PANEL - Abnormal; Notable for the following components:      Result Value   Glucose, Bld 126 (*)    BUN 31 (*)    Creatinine, Ser 2.32 (*)    Calcium 8.5 (*)    Albumin 2.9 (*)    GFR, Estimated  22 (*)    All other components within normal limits  CBC - Abnormal; Notable for the following components:   RBC 2.70 (*)    Hemoglobin 8.9 (*)    HCT 28.0 (*)    MCV 103.7 (*)    RDW 17.7 (*)    Platelets 111 (*)    All other components within normal limits     EKG  I, Phineas Semen, attending physician, personally viewed and interpreted this EKG  EKG Time: 1554 Rate: 96 Rhythm: normal sinus rhythm Axis: left axis deviation Intervals: qtc 427 QRS: narrow ST changes: no st elevation Impression: abnormal ekg  RADIOLOGY I independently interpreted and visualized the CT head. My interpretation: No bleed Radiology interpretation:  IMPRESSION:  1. No acute intracranial abnormality.  2. Stable mild diffuse atrophy and mild periventricular white matter  hypodensity.    I independently interpreted and visualized the CT abd/pel. My interpretation: No free air Radiology interpretation:  IMPRESSION:  Evaluation significantly limited by motion.    Markedly  dilated urinary bladder. Please correlate for retention or  bladder outlet obstruction. No proximal collecting system dilatation  or obvious stone.    Moderate colonic stool. No bowel obstruction, free air or free  fluid.    Lower lung parenchymal opacity seen with elevation of the left  hemidiaphragm. The opacities in the right lung base may be slightly  increased from prior CT scan but degraded by motion.     PROCEDURES:  Critical Care performed: No  MEDICATIONS ORDERED IN ED: Medications - No data to display   IMPRESSION / MDM / ASSESSMENT AND PLAN / ED COURSE  I reviewed the triage vital signs and the nursing notes.                              Differential diagnosis includes, but is not limited to, infection, ICH, anemia, dementia  Patient's presentation is most consistent with acute presentation with potential threat to life or bodily function.   The patient is on the cardiac monitor to evaluate  for evidence of arrhythmia and/or significant heart rate changes.  Patient presents to the emergency department today because of concerns for altered mental status.  On exam patient is calm.  Not completely oriented.  CT head did not show any acute intracranial abnormalities.  Blood work without any concerning leukocytosis or electrolyte abnormality.  I did get a CT abdomen pelvis given the left lower quadrant pain and history of diverticulitis.  While this did not show any acute infection it did show a markedly enlarged bladder.  A Foley catheter was inserted and patient had a large volume of output.  No clear infectious etiology of the patient's symptoms.  Had a discussion with the daughter.  Did offer admission for further workup and management however daughter felt comfortable taking the patient home which I feel is reasonable given reassuring work up. Did discuss return precautions for any worsening symptoms.       FINAL CLINICAL IMPRESSION(S) / ED DIAGNOSES   Final diagnoses:  Altered mental status, unspecified altered mental status type  Urinary retention      Note:  This document was prepared using Dragon voice recognition software and may include unintentional dictation errors.    Phineas Semen, MD 11/12/22 [phone]

## 2022-11-14 ENCOUNTER — Other Ambulatory Visit: Payer: Self-pay | Admitting: Oncology

## 2022-11-19 ENCOUNTER — Encounter: Payer: Self-pay | Admitting: Oncology

## 2022-11-19 ENCOUNTER — Inpatient Hospital Stay: Payer: Medicare HMO

## 2022-11-19 ENCOUNTER — Inpatient Hospital Stay (HOSPITAL_BASED_OUTPATIENT_CLINIC_OR_DEPARTMENT_OTHER): Payer: Medicare HMO | Admitting: Oncology

## 2022-11-19 ENCOUNTER — Other Ambulatory Visit: Payer: Self-pay | Admitting: *Deleted

## 2022-11-19 VITALS — BP 133/67 | HR 80 | Temp 92.0°F | Resp 18 | Ht 60.0 in | Wt 105.1 lb

## 2022-11-19 DIAGNOSIS — G893 Neoplasm related pain (acute) (chronic): Secondary | ICD-10-CM

## 2022-11-19 DIAGNOSIS — Z5112 Encounter for antineoplastic immunotherapy: Secondary | ICD-10-CM | POA: Diagnosis not present

## 2022-11-19 DIAGNOSIS — C3491 Malignant neoplasm of unspecified part of right bronchus or lung: Secondary | ICD-10-CM

## 2022-11-19 DIAGNOSIS — R748 Abnormal levels of other serum enzymes: Secondary | ICD-10-CM

## 2022-11-19 DIAGNOSIS — N179 Acute kidney failure, unspecified: Secondary | ICD-10-CM | POA: Diagnosis not present

## 2022-11-19 LAB — CBC WITH DIFFERENTIAL/PLATELET
Abs Immature Granulocytes: 0.11 10*3/uL — ABNORMAL HIGH (ref 0.00–0.07)
Basophils Absolute: 0 10*3/uL (ref 0.0–0.1)
Basophils Relative: 0 %
Eosinophils Absolute: 0 10*3/uL (ref 0.0–0.5)
Eosinophils Relative: 0 %
HCT: 25.5 % — ABNORMAL LOW (ref 36.0–46.0)
Hemoglobin: 8.2 g/dL — ABNORMAL LOW (ref 12.0–15.0)
Immature Granulocytes: 2 %
Lymphocytes Relative: 16 %
Lymphs Abs: 1.1 10*3/uL (ref 0.7–4.0)
MCH: 32.5 pg (ref 26.0–34.0)
MCHC: 32.2 g/dL (ref 30.0–36.0)
MCV: 101.2 fL — ABNORMAL HIGH (ref 80.0–100.0)
Monocytes Absolute: 0.7 10*3/uL (ref 0.1–1.0)
Monocytes Relative: 11 %
Neutro Abs: 4.7 10*3/uL (ref 1.7–7.7)
Neutrophils Relative %: 71 %
Platelets: 179 10*3/uL (ref 150–400)
RBC: 2.52 MIL/uL — ABNORMAL LOW (ref 3.87–5.11)
RDW: 16.2 % — ABNORMAL HIGH (ref 11.5–15.5)
WBC: 6.6 10*3/uL (ref 4.0–10.5)
nRBC: 0 % (ref 0.0–0.2)

## 2022-11-19 LAB — COMPREHENSIVE METABOLIC PANEL
ALT: 12 U/L (ref 0–44)
AST: 25 U/L (ref 15–41)
Albumin: 3 g/dL — ABNORMAL LOW (ref 3.5–5.0)
Alkaline Phosphatase: 71 U/L (ref 38–126)
Anion gap: 10 (ref 5–15)
BUN: 49 mg/dL — ABNORMAL HIGH (ref 8–23)
CO2: 21 mmol/L — ABNORMAL LOW (ref 22–32)
Calcium: 8.8 mg/dL — ABNORMAL LOW (ref 8.9–10.3)
Chloride: 107 mmol/L (ref 98–111)
Creatinine, Ser: 2.36 mg/dL — ABNORMAL HIGH (ref 0.44–1.00)
GFR, Estimated: 21 mL/min — ABNORMAL LOW (ref >60)
Glucose, Bld: 151 mg/dL — ABNORMAL HIGH (ref 70–99)
Potassium: 3.9 mmol/L (ref 3.5–5.1)
Sodium: 138 mmol/L (ref 135–145)
Total Bilirubin: 0.4 mg/dL (ref 0.3–1.2)
Total Protein: 6.9 g/dL (ref 6.5–8.1)

## 2022-11-19 MED ORDER — SODIUM CHLORIDE 0.9 % IV SOLN
Freq: Once | INTRAVENOUS | Status: AC
  Filled 2022-11-19: qty 250

## 2022-11-19 MED ORDER — OXYCODONE HCL 20 MG PO TABS
10.0000 mg | ORAL_TABLET | ORAL | 0 refills | Status: DC | PRN
Start: 2022-11-19 — End: 2022-12-08

## 2022-11-19 MED ORDER — HEPARIN SOD (PORK) LOCK FLUSH 100 UNIT/ML IV SOLN
500.0000 [IU] | Freq: Once | INTRAVENOUS | Status: AC
  Administered 2022-11-19: 500 [IU] via INTRAVENOUS
  Filled 2022-11-19: qty 5

## 2022-11-19 MED FILL — Dexamethasone Sodium Phosphate Inj 100 MG/10ML: INTRAMUSCULAR | Qty: 1 | Status: AC

## 2022-11-19 NOTE — Progress Notes (Signed)
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  She smoked about 2 packs of cigarettes per day but quit smoking in 2010.  She presented to the ER withSymptoms of significant back pain MRIs lumbar spine without contrast showed pathologic fracture of the L1 vertebral body with posterior bulging of the cortex resulting in mild spinal canal stenosis.  Diffuse T1 hypointense abnormality in the L1 vertebral body with involvement of bilateral pedicles and transverse processes.  CT chest abdomen and pelvis with contrast showed 1 cm left supraclavicular adenopathy.  1 cm right subcarinal lymph node.  Necrotic 3.1 x 2.4 x 2.9 cm anterior mediastinal mass in the left prevascular region with mild mass effect on the brachiocephalic vein.  Possible phrenic nerve involvement.  Mild acute sigmoid colon diverticulitis.    Patient had biopsy of the left supraclavicular lymph node which was consistent with adenocarcinoma of lung primary.  NGS testing did not show any actionable mutations.  K-ras G12 D mutation detected.  MSI stable.  Patient underwent percutaneous fixation of the L1 vertebral body.  She completed palliative radiation treatment to her spine.  She declined adjuvant bisphosphonates.  She received 4 cycles of carbo Alimta Keytruda chemotherapy.  Scan showed partial response and presently patient is on maintenance Alimta and Keytruda   Patient noted to have progressive anemia as well as evidence of  AKI.  Last dose of Alimta was therefore given on 10/08/2022 and patient is presently on single agent Keytruda alone  Interval history-patient was in the ER about a week ago for symptoms of confusion.  No infectious etiology was found.  She was found to have urinary retention of unclear etiology and had a Foley catheter placed.  She was asked to follow-up with trial of void clinic with urology.  She never heard back from urology.  Patient's daughter who is a nurse eventually pulled off the catheter and patient has been able to urinate without any difficulty.  She had a CT abdomen done in the ER which also showed moderate amount of stool but no other etiology.  Patient has been having on and off episodes of confusion which was the reason she was taken to the ER but no etiology has been found as such.  I think he has been clearer in the last 2 days.  No fever.  No recent changes in her medications.  She has not been using her senna and MiraLAX consistently.  She was concerned about having loose pasty stools on MiraLAX  ECOG PS- 2 Pain scale- 0   Review of systems- Review of Systems  Constitutional:  Positive for malaise/fatigue. Negative for chills, fever and weight loss.  HENT:  Negative for congestion, ear discharge and nosebleeds.   Eyes:  Negative for blurred vision.  Respiratory:  Negative for cough, hemoptysis, sputum production, shortness of breath and wheezing.   Cardiovascular:  Negative for chest pain, palpitations, orthopnea and claudication.  Gastrointestinal:  Negative for abdominal pain, blood in stool, constipation, diarrhea, heartburn, melena, nausea and vomiting.  Genitourinary:  Negative for dysuria, flank pain, frequency, hematuria and urgency.  Musculoskeletal:  Negative for back pain, joint pain and myalgias.  Skin:  Negative for rash.  Neurological:  Negative for dizziness, tingling, focal weakness, seizures, weakness and headaches.  Endo/Heme/Allergies:  Does not bruise/bleed  easily.  Psychiatric/Behavioral:  Negative for depression and suicidal ideas. The patient does not have insomnia.       No Known Allergies   Past Medical History:  Diagnosis Date   Arthritis    hands and feet   Cancer associated pain    Cataract    right   Complication of anesthesia    sometimes have a hard time waking up   Diabetes mellitus    Borderline Diabetes   GERD (gastroesophageal reflux disease)    HTN (hypertension)    Hyperlipidemia    Lung cancer (HCC)    Motion sickness    Post corneal transplant    right     Past Surgical History:  Procedure Laterality Date   APPLICATION OF INTRAOPERATIVE CT SCAN  02/15/2022   Procedure: APPLICATION OF INTRAOPERATIVE CT SCAN;  Surgeon: Venetia Night, MD;  Location: [facility] ORS;  Service: Neurosurgery;;   BASAL CELL CARCINOMA EXCISION     Dr. Lorn Junes   CARPAL TUNNEL RELEASE     right   COLONOSCOPY WITH PROPOFOL N/A 12/03/2015   Procedure: COLONOSCOPY WITH PROPOFOL;  Surgeon: Scot Jun, MD;  Location: [facility];  Service: Endoscopy;  Laterality: N/A;   COLONOSCOPY WITH PROPOFOL N/A 02/22/2020   Procedure: COLONOSCOPY WITH PROPOFOL;  Surgeon: Regis Bill, MD;  Location: [facility] ENDOSCOPY;  Service: Endoscopy;  Laterality: N/A;   ESOPHAGOGASTRODUODENOSCOPY (EGD) WITH PROPOFOL N/A 02/22/2020   Procedure: ESOPHAGOGASTRODUODENOSCOPY (EGD) WITH PROPOFOL;  Surgeon: Regis Bill, MD;  Location: [facility] ENDOSCOPY;  Service: Endoscopy;  Laterality: N/A;   ESOPHAGOGASTRODUODENOSCOPY (EGD) WITH PROPOFOL N/A 06/07/2022   Procedure: ESOPHAGOGASTRODUODENOSCOPY (EGD) WITH PROPOFOL;  Surgeon: Regis Bill, MD;  Location: [facility] ENDOSCOPY;  Service: Endoscopy;  Laterality: N/A;   HAMMER TOE SURGERY Right 05/23/2019   Procedure: HAMMER TOE CORRECTION;  Surgeon: Gwyneth Revels, DPM;  Location: [facility];  Service: Podiatry;  Laterality: Right;  Diabetic   IR BONE TUMOR(S)RF ABLATION  04/30/2022   IR IMAGING  GUIDED PORT INSERTION  04/14/2022   IR KYPHO LUMBAR INC FX REDUCE BONE BX UNI/BIL CANNULATION INC/IMAGING  04/30/2022   IR RADIOLOGIST EVAL & MGMT  03/24/2022   IR RADIOLOGIST EVAL & MGMT  06/23/2022   OSTECTOMY Right 05/23/2019   Procedure: DOUBLE OSTEOTOMY RIGHT;  Surgeon: Gwyneth Revels, DPM;  Location: [facility];  Service: Podiatry;  Laterality: Right;   SKIN CANCER EXCISION      Social History   Socioeconomic History   Marital status: Widowed    Spouse name: Not on file   Number of children: Not on file   Years of education: Not on file   Highest education level: Not on file  Occupational History    Employer: [employer]    Comment: Currently not employed  Tobacco Use   Smoking status: Former    Current packs/day: 0.00    Average packs/day: 2.0 packs/day for 20.0 years (40.0 ttl pk-yrs)    Types: Cigarettes    Start date: 01/13/1989    Quit date: 01/13/2009    Years since quitting: 13.8   Smokeless tobacco: Never  Vaping Use   Vaping status: Never Used  Substance and Sexual Activity   Alcohol use: No   Drug use:  No   Sexual activity: Not Currently    Birth control/protection: Post-menopausal  Other Topics Concern   Not on file  Social History Narrative   Not on file   Social Determinants of Health   Financial Resource Strain: Not on file  Food Insecurity: No Food Insecurity (08/18/2022)   Hunger Vital Sign    Worried About Running Out of Food in the Last Year: Never true    Ran Out of Food in the Last Year: Never true  Transportation Needs: No Transportation Needs (08/18/2022)   PRAPARE - Administrator, Civil Service (Medical): No    Lack of Transportation (Non-Medical): No  Physical Activity: Not on file  Stress: Not on file  Social Connections: Unknown (08/12/2021)   Received from [facility]   Social Network    Social Network: Not on file  Intimate Partner Violence: Not At Risk (08/18/2022)   Humiliation, Afraid, Rape, and Kick questionnaire     Fear of Current or Ex-Partner: No    Emotionally Abused: No    Physically Abused: No    Sexually Abused: No    Family History  Problem Relation Age of Onset   Heart disease Mother    Heart disease Father    Heart disease Maternal Grandmother    Heart disease Maternal Grandfather    Breast cancer Sister 52     Current Outpatient Medications:    acetaminophen (TYLENOL) 500 MG tablet, Take 500 mg by mouth every 6 (six) hours as needed for moderate pain, mild pain, fever or headache., Disp: , Rfl:    albuterol (PROVENTIL) (2.5 MG/3ML) 0.083% nebulizer solution, Take 3 mLs (2.5 mg total) by nebulization in the morning and at bedtime., Disp: 169 mL, Rfl: 12   Ascorbic Acid (VITAMIN C) 1000 MG tablet, Take 1,000 mg by mouth daily.  , Disp: , Rfl:    carvedilol (COREG) 3.125 MG tablet, Take 3.125 mg by mouth 2 (two) times daily with a meal., Disp: , Rfl:    cyclobenzaprine (FLEXERIL) 5 MG tablet, Take 1 tablet (5 mg total) by mouth 3 (three) times daily as needed for muscle spasms., Disp: 30 tablet, Rfl: 0   dexamethasone (DECADRON) 4 MG tablet, TAKE 1 TABLET 2 TIMES DAILY STARTING DAY BEFORE PEMETREXED. THEN TAKE 2 TABS DAILY FOR 3 DAYS STARTING DAY AFTER CARBOPLATIN. TAKE WITH FOOD., Disp: 30 tablet, Rfl: 1   Docusate Calcium (STOOL SOFTENER PO), Take 1 capsule by mouth daily as needed (constipation)., Disp: , Rfl:    ELIQUIS 5 MG TABS tablet, TAKE 1 TABLET BY MOUTH TWICE A DAY, Disp: 60 tablet, Rfl: 2   folic acid (FOLVITE) 1 MG tablet, Take 1 tablet (1 mg total) by mouth daily. Start 7 days before pemetrexed chemotherapy. Continue until 21 days after pemetrexed completed., Disp: 100 tablet, Rfl: 3   gabapentin (NEURONTIN) 100 MG capsule, Take 100 mg by mouth 2 (two) times daily., Disp: , Rfl:    lidocaine-prilocaine (EMLA) cream, Apply 1 Application topically as needed., Disp: 30 g, Rfl: 2   LORazepam (ATIVAN) 0.5 MG tablet, Take 1 tablet (0.5 mg total) by mouth every 8 (eight) hours as  needed for anxiety., Disp: 30 tablet, Rfl: 0   losartan (COZAAR) 100 MG tablet, Take 100 mg by mouth daily., Disp: , Rfl:    metFORMIN (GLUCOPHAGE) 500 MG tablet, Take 1 tablet (500 mg total) by mouth 2 (two) times daily with a meal., Disp: , Rfl:    Multiple Vitamin (MULTIVITAMIN) capsule, Take  1 capsule by mouth daily.  , Disp: , Rfl:    omeprazole (PRILOSEC) 20 MG capsule, Take 1 capsule (20 mg total) by mouth daily., Disp: 90 capsule, Rfl: 3   ondansetron (ZOFRAN) 8 MG tablet, Take 1 tablet (8 mg total) by mouth every 8 (eight) hours as needed for nausea or vomiting., Disp: 30 tablet, Rfl: 1   Oxycodone HCl 20 MG TABS, Take 0.5-1 tablets (10-20 mg total) by mouth every 4 (four) hours as needed., Disp: 60 tablet, Rfl: 0   polyethylene glycol (MIRALAX / GLYCOLAX) 17 g packet, Take 17 g by mouth daily., Disp: 14 each, Rfl: 0   prednisoLONE acetate (PRED MILD) 0.12 % ophthalmic suspension, Place 1 drop into both eyes daily., Disp: , Rfl:    prochlorperazine (COMPAZINE) 10 MG tablet, Take 1 tablet (10 mg total) by mouth every 6 (six) hours as needed for nausea or vomiting., Disp: 30 tablet, Rfl: 2   simvastatin (ZOCOR) 40 MG tablet, TAKE 1 TABLET EVERY DAY  AT  6:00 PM, Disp: 90 tablet, Rfl: 3   sodium chloride HYPERTONIC 3 % nebulizer solution, Take by nebulization in the morning and at bedtime., Disp: 750 mL, Rfl: 12 No current facility-administered medications for this visit.  Facility-Administered Medications Ordered in Other Visits:    0.9 %  sodium chloride infusion, , Intravenous, Continuous, Borders, Daryl Eastern, NP, Stopped at 08/09/22 1434   heparin lock flush 100 unit/mL, 500 Units, Intravenous, Once, Alinda Dooms, NP   sodium chloride flush (NS) 0.9 % injection 10 mL, 10 mL, Intracatheter, PRN, Creig Hines, MD, 10 mL at 09/17/22 1112  Physical exam:  Vitals:   11/19/22 0826  BP: 133/67  Pulse: 80  Resp: 18  Temp: (!) 92 F (33.3 C)  TempSrc: Tympanic  SpO2: 97%  Weight: 105  lb 1.6 oz (47.7 kg)  Height: 5' (1.524 m)   Physical Exam Constitutional:      Comments: She is thin and somewhat frail.  Appears in no acute distress  Cardiovascular:     Rate and Rhythm: Normal rate and regular rhythm.     Heart sounds: Normal heart sounds.  Pulmonary:     Effort: Pulmonary effort is normal.     Breath sounds: Normal breath sounds.  Abdominal:     General: Bowel sounds are normal.     Palpations: Abdomen is soft.  Skin:    General: Skin is warm and dry.  Neurological:     Mental Status: She is alert and oriented to person, place, and time.         Latest Ref Rng & Units 11/11/2022    3:59 PM  CMP  Glucose 70 - 99 mg/dL 161   BUN 8 - 23 mg/dL 31   Creatinine 0.96 - 1.00 mg/dL 0.45   Sodium 409 - 811 mmol/L 136   Potassium 3.5 - 5.1 mmol/L 4.5   Chloride 98 - 111 mmol/L 105   CO2 22 - 32 mmol/L 24   Calcium 8.9 - 10.3 mg/dL 8.5   Total Protein 6.5 - 8.1 g/dL 7.0   Total Bilirubin 0.3 - 1.2 mg/dL 0.4   Alkaline Phos 38 - 126 U/L 78   AST 15 - 41 U/L 21   ALT 0 - 44 U/L 10       Latest Ref Rng & Units 11/19/2022    8:16 AM  CBC  WBC 4.0 - 10.5 K/uL 6.6   Hemoglobin 12.0 - 15.0 g/dL 8.2  Hematocrit 36.0 - 46.0 % 25.5   Platelets 150 - 400 K/uL 179     No images are attached to the encounter.  CT ABDOMEN PELVIS WO CONTRAST  Result Date: 11/11/2022 CLINICAL DATA:  Left-sided abdominal pain.  History of lung cancer. EXAM: CT ABDOMEN AND PELVIS WITHOUT CONTRAST TECHNIQUE: Multidetector CT imaging of the abdomen and pelvis was performed following the standard protocol without IV contrast. RADIATION DOSE REDUCTION: This exam was performed according to the departmental dose-optimization program which includes automated exposure control, adjustment of the mA and/or kV according to patient size and/or use of iterative reconstruction technique. COMPARISON:  CT 10/05/2022 FINDINGS: Significant breathing motion seen throughout the examination limiting evaluation.  Lower chest: Elevated left hemidiaphragm. Patchy opacity seen along the lung bases. Slightly increasing in the right lung base. Again evaluation limited by motion. Presumed central catheter in the right atrium. Hepatobiliary: Low-attenuation lesion seen segment 6 is poorly defined on this motion degraded exam. Grossly unchanged. The gallbladder is distended. Please correlate for other signs of gallbladder pathology and if needed follow up ultrasound. Pancreas: Moderate global atrophy of the pancreas. Obscured by the motion. Spleen: Normal in size without focal abnormality. Adrenals/Urinary Tract: Adrenal glands are preserved. No abnormal calcification seen within either kidney nor along the expected course of either ureter. Evaluation for small stone is limited by the motion. No collecting system dilatation of the ureters. However there is a severely distended urinary bladder with dimensions approaching 18.3 by 9.2 by 15.0 cm. Please correlate for bladder outlet obstruction or retention. Stomach/Bowel: Stomach and small bowel is nondilated. Large bowel has moderate colonic stool. No dilatation. Appendix is poorly seen with the motion. No definite pericecal stranding or fluid. Vascular/Lymphatic: Aortic atherosclerosis. No enlarged abdominal or pelvic lymph nodes. Reproductive: Uterus and bilateral adnexa are unremarkable. Other: Mild anasarca. No free air or free fluid clearly seen. Again evaluation significantly limited by motion. Musculoskeletal: Significant streak artifact related to the fixation hardware along the thoracolumbar spine. There is augmentation cement at L1 with compression, unchanged from previous. Grade 1 anterolisthesis of L4 on L5. Multilevel degenerative changes as well. Please correlate with clinical history. IMPRESSION: Evaluation significantly limited by motion. Markedly dilated urinary bladder. Please correlate for retention or bladder outlet obstruction. No proximal collecting system  dilatation or obvious stone. Moderate colonic stool. No bowel obstruction, free air or free fluid. Lower lung parenchymal opacity seen with elevation of the left hemidiaphragm. The opacities in the right lung base may be slightly increased from prior CT scan but degraded by motion. Electronically Signed   By: Karen Kays M.D.   On: 11/11/2022 19:46   CT Head Wo Contrast  Result Date: 11/11/2022 CLINICAL DATA:  Mental status change EXAM: CT HEAD WITHOUT CONTRAST TECHNIQUE: Contiguous axial images were obtained from the base of the skull through the vertex without intravenous contrast. RADIATION DOSE REDUCTION: This exam was performed according to the departmental dose-optimization program which includes automated exposure control, adjustment of the mA and/or kV according to patient size and/or use of iterative reconstruction technique. COMPARISON:  Head CT 08/17/2022.  MRI head 02/16/2022. FINDINGS: Brain: No evidence of acute infarction, hemorrhage, hydrocephalus, extra-axial collection or mass lesion/mass effect. Mild diffuse atrophy persists. Mild periventricular white matter hypodensity is unchanged from prior. Vascular: Atherosclerotic calcifications are present within the cavernous internal carotid arteries. Skull: Normal. Negative for fracture or focal lesion. Sinuses/Orbits: No acute finding. Other: None. IMPRESSION: 1. No acute intracranial abnormality. 2. Stable mild diffuse atrophy and mild periventricular white  matter hypodensity. Electronically Signed   By: Darliss Cheney M.D.   On: 11/11/2022 16:51   DG Chest 2 View  Result Date: 11/11/2022 CLINICAL DATA:  Shortness of breath and wheezing. History of metastatic lung cancer. EXAM: CHEST - 2 VIEW COMPARISON:  09/23/2022 FINDINGS: Right lung clear. Similar streaky and consolidative opacity at the left base with possible small left pleural effusion. The cardiopericardial silhouette is within normal limits for size. Right Port-A-Cath tip overlies the  mid to distal SVC level. Thoracolumbar fusion hardware has been incompletely visualized. IMPRESSION: Similar streaky and consolidative opacity, potentially post treatment scarring, at the left base with possible small left pleural effusion. Electronically Signed   By: Kennith Center M.D.   On: 11/11/2022 15:07     Assessment and plan- Patient is a 72 y.o. female with metastatic adenocarcinoma of the lung here for on treatment assessment prior to next cycle of palliative Keytruda  Patient last received Alimta on 6/28/2024At that time her creatinine was 1.55.  Her creatinine was normal around 0.9 0.8 in March 2024.  Alimta was thought to be possibly contributing to her AKI especially given her worsening anemia as well and therefore she did not receive any further doses of Alimta.  Despite that her creatinine has been gradually going up and presently it is at 2.3.  Rande Lawman can also at times cause kidney injury and I am holding off on giving her Keytruda today.  I will give her 1 L of IV fluids and refer her to nephrology at this time.  I will reassess her in 2 weeks time and see if we are able to proceed with Keytruda or not.  Hopefully she would have seen nephrology by then.  Constipation: Patient states that her bowel movements have been regular over the last few days.  I encouraged the use of senna and either MiraLAX or docusate before considering Linzess.  Bone mets: Patient has refused bisphosphonates in the past  Neoplasm related pain: Oxycodone will be refilled today   Visit Diagnosis 1. Encounter for antineoplastic immunotherapy   2. Primary malignant neoplasm of right lung metastatic to other site (HCC)   3. AKI (acute kidney injury) (HCC)   4. Neoplasm related pain      Dr. Owens Shark, MD, MPH [facility] [phone] 11/19/2022 8:34 AM

## 2022-11-19 NOTE — Progress Notes (Signed)
Patients creatinine 2.36. Per Smith Robert, MD no chemo today but ordered 1L NS over an hour. Pt educated on change of treatment plan. Pt was given 1L NS. Pt stable at discharge.

## 2022-11-22 ENCOUNTER — Telehealth: Payer: Self-pay

## 2022-11-22 ENCOUNTER — Telehealth: Payer: Self-pay | Admitting: *Deleted

## 2022-11-22 NOTE — Telephone Encounter (Signed)
I have faxed the referral to [facility] on 11/19/22

## 2022-11-22 NOTE — Telephone Encounter (Signed)
Pt's daughter called in to report that pt continues to have confusion and is asking if she needs a referral to neurology.   Please advise.

## 2022-11-23 ENCOUNTER — Encounter: Payer: Self-pay | Admitting: Oncology

## 2022-11-23 NOTE — Telephone Encounter (Signed)
Referral faxed to [facility] neurology. Daughter made aware.

## 2022-11-24 ENCOUNTER — Telehealth: Payer: Self-pay | Admitting: *Deleted

## 2022-11-24 DIAGNOSIS — R42 Dizziness and giddiness: Secondary | ICD-10-CM

## 2022-11-24 DIAGNOSIS — C3491 Malignant neoplasm of unspecified part of right bronchus or lung: Secondary | ICD-10-CM

## 2022-11-24 DIAGNOSIS — C799 Secondary malignant neoplasm of unspecified site: Secondary | ICD-10-CM

## 2022-11-24 NOTE — Telephone Encounter (Signed)
Received message from pt's daughter that pt experienced episode of dizziness yesterday causing her to have difficulty ambulating. Pt is a little better this morning with slight dizziness but able to walk better. She was able to eat a good breakfast and drink fluids this morning as well. Pt's daughter is asking if she needs to be seen or continue to wait for appt with neurology?  Please advise.

## 2022-11-24 NOTE — Telephone Encounter (Signed)
Brain MRI scheduled for 8/15 at 7pm. Pt's daughter made aware to arrive [facility] to [facility]. Instructed to call back with any more concerns.

## 2022-11-24 NOTE — Telephone Encounter (Signed)
MRI brain asap

## 2022-11-25 ENCOUNTER — Ambulatory Visit
Admission: RE | Admit: 2022-11-25 | Discharge: 2022-11-25 | Disposition: A | Discharge status: Home / Self Care | Payer: Medicare HMO | Source: Ambulatory Visit | Attending: Oncology | Admitting: Oncology

## 2022-11-25 DIAGNOSIS — C3491 Malignant neoplasm of unspecified part of right bronchus or lung: Secondary | ICD-10-CM | POA: Diagnosis present

## 2022-11-25 DIAGNOSIS — R42 Dizziness and giddiness: Secondary | ICD-10-CM | POA: Diagnosis present

## 2022-11-25 MED ORDER — GADOBUTROL 1 MMOL/ML IV SOLN
4.0000 mL | Freq: Once | INTRAVENOUS | Status: AC | PRN
  Administered 2022-11-25: 4 mL via INTRAVENOUS

## 2022-11-26 NOTE — Addendum Note (Signed)
Addended by: Glory Buff on: 11/26/2022 01:25 PM   Modules accepted: Orders

## 2022-11-29 ENCOUNTER — Encounter: Payer: Self-pay | Admitting: Oncology

## 2022-11-30 ENCOUNTER — Telehealth: Payer: Self-pay | Admitting: *Deleted

## 2022-11-30 MED ORDER — TRAZODONE HCL 50 MG PO TABS: 50.0000 mg | ORAL_TABLET | Freq: Every evening | ORAL | 1 refills | Status: DC | PRN

## 2022-11-30 NOTE — Telephone Encounter (Signed)
Pt's daughter is asking if pt can get something to help her sleep at night. States that pt is up during the night walking the halls, restless, and unable to fall asleep.

## 2022-11-30 NOTE — Telephone Encounter (Signed)
Trazodone 50 mg.

## 2022-11-30 NOTE — Telephone Encounter (Signed)
Pt's daughter made aware and stated that they are getting more concerned about her not eating very much and having weakness. States that they will see how she does throughout the week but do not feel she is strong enough at this time for treatment on Friday 8/23. They still want to keep the appt as scheduled but wanted Dr. Smith Robert to be aware of her condition at this time.

## 2022-12-02 ENCOUNTER — Telehealth: Payer: Self-pay | Admitting: *Deleted

## 2022-12-02 MED FILL — Dexamethasone Sodium Phosphate Inj 100 MG/10ML: INTRAMUSCULAR | Qty: 1 | Status: AC

## 2022-12-02 NOTE — Telephone Encounter (Signed)
Received call from pt's daughter stating that pt has progressively gotten weaker over the past week and unable to come in for appt tomorrow. Daughter states that pt is most likely approaching end of life at this time since has not eaten anything for 7 days. Offered to place referral to hospice but pt's daughter declined and states that since she works for hospice that she will be providing all of her care for end of life. States that she has all that she needs to keep pt comfortable but may need prescription refills in the future.

## 2022-12-03 ENCOUNTER — Encounter: Payer: Self-pay | Admitting: Oncology

## 2022-12-03 ENCOUNTER — Inpatient Hospital Stay: Payer: Medicare HMO

## 2022-12-03 ENCOUNTER — Inpatient Hospital Stay: Payer: Medicare HMO | Admitting: Oncology

## 2022-12-03 NOTE — Telephone Encounter (Signed)
Pt's daughter is agreeable. Schedule message sent to schedule appt.

## 2022-12-03 NOTE — Telephone Encounter (Signed)
If they are agreeable to do a virtual visit we can try that just to discuss end of life goals

## 2022-12-06 ENCOUNTER — Encounter: Payer: Self-pay | Admitting: Oncology

## 2022-12-06 ENCOUNTER — Inpatient Hospital Stay: Payer: Medicare HMO | Admitting: Oncology

## 2022-12-08 ENCOUNTER — Other Ambulatory Visit: Payer: Self-pay | Admitting: *Deleted

## 2022-12-08 DIAGNOSIS — C3491 Malignant neoplasm of unspecified part of right bronchus or lung: Secondary | ICD-10-CM

## 2022-12-08 MED ORDER — OXYCODONE HCL 20 MG PO TABS
10.0000 mg | ORAL_TABLET | ORAL | 0 refills | Status: DC | PRN
Start: 2022-12-08 — End: 2023-01-03

## 2022-12-09 ENCOUNTER — Inpatient Hospital Stay: Payer: Medicare HMO | Admitting: Oncology

## 2022-12-16 ENCOUNTER — Ambulatory Visit: Payer: Medicare HMO | Admitting: Neurosurgery

## 2022-12-17 ENCOUNTER — Inpatient Hospital Stay: Payer: Medicare HMO | Admitting: Internal Medicine

## 2022-12-22 ENCOUNTER — Other Ambulatory Visit: Payer: Self-pay | Admitting: Oncology

## 2022-12-22 NOTE — Telephone Encounter (Signed)
I am refilling it today. Patient will need to see me in person sometime over the next 2-3 weeks for me to continue to be able to refill medications for her even if she is not getting active rx. Please inform daughter

## 2022-12-24 ENCOUNTER — Other Ambulatory Visit: Payer: Medicare HMO

## 2022-12-24 ENCOUNTER — Ambulatory Visit: Payer: Medicare HMO

## 2022-12-24 ENCOUNTER — Ambulatory Visit: Payer: Medicare HMO | Admitting: Oncology

## 2022-12-31 ENCOUNTER — Telehealth: Payer: Self-pay | Admitting: *Deleted

## 2023-01-11 NOTE — Telephone Encounter (Signed)
Pt's daughter, Toniann Fail, called to inform that pt passed away this morning.

## 2023-01-11 DEATH — deceased
